# Patient Record
Sex: Female | Born: 1980
Health system: Southern US, Community
[De-identification: ages and names within clinical notes are randomized; demographics above are authoritative.]

## PROBLEM LIST (undated history)

## (undated) DIAGNOSIS — F329 Major depressive disorder, single episode, unspecified: Secondary | ICD-10-CM

## (undated) DIAGNOSIS — G8929 Other chronic pain: Secondary | ICD-10-CM

## (undated) DIAGNOSIS — M549 Dorsalgia, unspecified: Secondary | ICD-10-CM

## (undated) DIAGNOSIS — R569 Unspecified convulsions: Secondary | ICD-10-CM

## (undated) DIAGNOSIS — A630 Anogenital (venereal) warts: Secondary | ICD-10-CM

## (undated) DIAGNOSIS — R519 Headache, unspecified: Secondary | ICD-10-CM

## (undated) DIAGNOSIS — G709 Myoneural disorder, unspecified: Secondary | ICD-10-CM

## (undated) DIAGNOSIS — B009 Herpesviral infection, unspecified: Secondary | ICD-10-CM

## (undated) DIAGNOSIS — F32A Depression, unspecified: Secondary | ICD-10-CM

## (undated) DIAGNOSIS — R51 Headache: Secondary | ICD-10-CM

## (undated) HISTORY — DX: Depression, unspecified: F32.A

## (undated) HISTORY — DX: Unspecified convulsions: R56.9

## (undated) HISTORY — DX: Major depressive disorder, single episode, unspecified: F32.9

---

## 2000-11-27 ENCOUNTER — Other Ambulatory Visit: Admission: RE | Admit: 2000-11-27 | Discharge: 2000-11-27 | Payer: Self-pay | Admitting: Family Medicine

## 2001-02-10 ENCOUNTER — Other Ambulatory Visit: Admission: RE | Admit: 2001-02-10 | Discharge: 2001-02-10 | Payer: Self-pay | Admitting: Obstetrics and Gynecology

## 2001-05-09 ENCOUNTER — Observation Stay (HOSPITAL_COMMUNITY): Admission: AD | Admit: 2001-05-09 | Discharge: 2001-05-10 | Payer: Self-pay | Admitting: Obstetrics and Gynecology

## 2001-08-10 ENCOUNTER — Inpatient Hospital Stay (HOSPITAL_COMMUNITY): Admission: AD | Admit: 2001-08-10 | Discharge: 2001-08-13 | Payer: Self-pay | Admitting: Obstetrics and Gynecology

## 2001-08-16 ENCOUNTER — Encounter: Payer: Self-pay | Admitting: *Deleted

## 2001-08-16 ENCOUNTER — Emergency Department (HOSPITAL_COMMUNITY): Admission: EM | Admit: 2001-08-16 | Discharge: 2001-08-16 | Payer: Self-pay | Admitting: *Deleted

## 2002-12-21 ENCOUNTER — Encounter: Payer: Self-pay | Admitting: Emergency Medicine

## 2002-12-21 ENCOUNTER — Emergency Department (HOSPITAL_COMMUNITY): Admission: EM | Admit: 2002-12-21 | Discharge: 2002-12-21 | Payer: Self-pay | Admitting: Emergency Medicine

## 2004-08-15 ENCOUNTER — Ambulatory Visit: Payer: Self-pay | Admitting: Family Medicine

## 2004-09-24 ENCOUNTER — Ambulatory Visit: Payer: Self-pay | Admitting: Family Medicine

## 2005-06-05 ENCOUNTER — Emergency Department (HOSPITAL_COMMUNITY): Admission: EM | Admit: 2005-06-05 | Discharge: 2005-06-05 | Payer: Self-pay | Admitting: Emergency Medicine

## 2005-10-02 ENCOUNTER — Ambulatory Visit: Payer: Self-pay | Admitting: Family Medicine

## 2005-10-30 ENCOUNTER — Ambulatory Visit: Payer: Self-pay | Admitting: Family Medicine

## 2006-02-24 ENCOUNTER — Emergency Department (HOSPITAL_COMMUNITY): Admission: EM | Admit: 2006-02-24 | Discharge: 2006-02-24 | Payer: Self-pay | Admitting: Emergency Medicine

## 2006-03-04 ENCOUNTER — Ambulatory Visit: Payer: Self-pay | Admitting: Family Medicine

## 2006-03-11 ENCOUNTER — Ambulatory Visit: Payer: Self-pay | Admitting: Family Medicine

## 2006-03-25 ENCOUNTER — Ambulatory Visit: Payer: Self-pay | Admitting: Family Medicine

## 2006-05-21 ENCOUNTER — Ambulatory Visit: Payer: Self-pay | Admitting: Family Medicine

## 2006-07-24 ENCOUNTER — Ambulatory Visit: Payer: Self-pay | Admitting: Family Medicine

## 2006-10-15 ENCOUNTER — Other Ambulatory Visit: Admission: RE | Admit: 2006-10-15 | Discharge: 2006-10-15 | Payer: Self-pay | Admitting: Family Medicine

## 2006-10-15 ENCOUNTER — Encounter: Payer: Self-pay | Admitting: Family Medicine

## 2006-10-15 ENCOUNTER — Ambulatory Visit: Payer: Self-pay | Admitting: Family Medicine

## 2006-10-17 ENCOUNTER — Encounter: Payer: Self-pay | Admitting: Family Medicine

## 2006-10-21 ENCOUNTER — Ambulatory Visit: Payer: Self-pay | Admitting: Family Medicine

## 2007-01-13 ENCOUNTER — Ambulatory Visit: Payer: Self-pay | Admitting: Family Medicine

## 2007-01-14 ENCOUNTER — Encounter: Payer: Self-pay | Admitting: Family Medicine

## 2007-01-14 LAB — CONVERTED CEMR LAB
Candida species: NEGATIVE
GC Probe Amp, Genital: NEGATIVE
Gardnerella vaginalis: POSITIVE — AB
Trichomonal Vaginitis: POSITIVE — AB

## 2007-03-12 ENCOUNTER — Ambulatory Visit: Payer: Self-pay | Admitting: Family Medicine

## 2007-03-13 ENCOUNTER — Encounter: Payer: Self-pay | Admitting: Family Medicine

## 2007-03-13 LAB — CONVERTED CEMR LAB: Chlamydia, DNA Probe: NEGATIVE

## 2007-04-14 ENCOUNTER — Ambulatory Visit: Payer: Self-pay | Admitting: Family Medicine

## 2007-07-02 ENCOUNTER — Encounter: Payer: Self-pay | Admitting: Family Medicine

## 2007-07-20 ENCOUNTER — Ambulatory Visit: Payer: Self-pay | Admitting: Family Medicine

## 2007-10-12 ENCOUNTER — Ambulatory Visit: Payer: Self-pay | Admitting: Family Medicine

## 2007-10-24 ENCOUNTER — Emergency Department (HOSPITAL_COMMUNITY): Admission: EM | Admit: 2007-10-24 | Discharge: 2007-10-24 | Payer: Self-pay | Admitting: Emergency Medicine

## 2007-12-10 DIAGNOSIS — F32A Depression, unspecified: Secondary | ICD-10-CM | POA: Insufficient documentation

## 2007-12-10 DIAGNOSIS — F17200 Nicotine dependence, unspecified, uncomplicated: Secondary | ICD-10-CM | POA: Insufficient documentation

## 2007-12-10 DIAGNOSIS — F329 Major depressive disorder, single episode, unspecified: Secondary | ICD-10-CM

## 2007-12-10 DIAGNOSIS — F17210 Nicotine dependence, cigarettes, uncomplicated: Secondary | ICD-10-CM | POA: Insufficient documentation

## 2008-04-05 ENCOUNTER — Emergency Department (HOSPITAL_COMMUNITY): Admission: EM | Admit: 2008-04-05 | Discharge: 2008-04-05 | Payer: Self-pay | Admitting: Emergency Medicine

## 2008-08-10 ENCOUNTER — Emergency Department (HOSPITAL_COMMUNITY): Admission: EM | Admit: 2008-08-10 | Discharge: 2008-08-10 | Payer: Self-pay | Admitting: Emergency Medicine

## 2008-09-20 ENCOUNTER — Emergency Department (HOSPITAL_COMMUNITY): Admission: EM | Admit: 2008-09-20 | Discharge: 2008-09-20 | Payer: Self-pay | Admitting: Emergency Medicine

## 2008-09-27 ENCOUNTER — Ambulatory Visit: Payer: Self-pay | Admitting: Family Medicine

## 2008-09-27 DIAGNOSIS — R5383 Other fatigue: Secondary | ICD-10-CM

## 2008-09-27 DIAGNOSIS — R5381 Other malaise: Secondary | ICD-10-CM | POA: Insufficient documentation

## 2008-09-27 DIAGNOSIS — N939 Abnormal uterine and vaginal bleeding, unspecified: Secondary | ICD-10-CM

## 2008-09-27 DIAGNOSIS — N926 Irregular menstruation, unspecified: Secondary | ICD-10-CM | POA: Insufficient documentation

## 2008-09-27 DIAGNOSIS — R42 Dizziness and giddiness: Secondary | ICD-10-CM

## 2008-09-30 ENCOUNTER — Encounter: Payer: Self-pay | Admitting: Family Medicine

## 2008-10-03 ENCOUNTER — Encounter: Payer: Self-pay | Admitting: Family Medicine

## 2008-10-21 ENCOUNTER — Inpatient Hospital Stay (HOSPITAL_COMMUNITY): Admission: EM | Admit: 2008-10-21 | Discharge: 2008-10-23 | Payer: Self-pay | Admitting: Emergency Medicine

## 2008-10-21 ENCOUNTER — Encounter: Payer: Self-pay | Admitting: Family Medicine

## 2008-10-26 ENCOUNTER — Ambulatory Visit: Payer: Self-pay | Admitting: Family Medicine

## 2008-10-26 DIAGNOSIS — R569 Unspecified convulsions: Secondary | ICD-10-CM

## 2008-10-27 ENCOUNTER — Ambulatory Visit (HOSPITAL_COMMUNITY): Admission: RE | Admit: 2008-10-27 | Discharge: 2008-10-27 | Payer: Self-pay | Admitting: Family Medicine

## 2008-10-28 ENCOUNTER — Encounter: Payer: Self-pay | Admitting: Family Medicine

## 2008-11-02 ENCOUNTER — Encounter (HOSPITAL_COMMUNITY): Admission: RE | Admit: 2008-11-02 | Discharge: 2008-12-02 | Payer: Self-pay | Admitting: Orthopaedic Surgery

## 2008-11-09 ENCOUNTER — Encounter: Payer: Self-pay | Admitting: Family Medicine

## 2008-11-17 ENCOUNTER — Encounter: Payer: Self-pay | Admitting: Family Medicine

## 2008-11-28 ENCOUNTER — Emergency Department (HOSPITAL_COMMUNITY): Admission: EM | Admit: 2008-11-28 | Discharge: 2008-11-29 | Payer: Self-pay | Admitting: Emergency Medicine

## 2008-11-29 ENCOUNTER — Ambulatory Visit (HOSPITAL_COMMUNITY): Admission: RE | Admit: 2008-11-29 | Discharge: 2008-11-29 | Payer: Self-pay | Admitting: Obstetrics and Gynecology

## 2008-11-29 ENCOUNTER — Telehealth: Payer: Self-pay | Admitting: Family Medicine

## 2008-11-29 ENCOUNTER — Encounter: Payer: Self-pay | Admitting: Obstetrics and Gynecology

## 2009-01-19 ENCOUNTER — Ambulatory Visit: Payer: Self-pay | Admitting: Family Medicine

## 2009-02-24 ENCOUNTER — Emergency Department (HOSPITAL_COMMUNITY): Admission: EM | Admit: 2009-02-24 | Discharge: 2009-02-24 | Payer: Self-pay | Admitting: Emergency Medicine

## 2009-03-06 ENCOUNTER — Encounter: Payer: Self-pay | Admitting: Family Medicine

## 2009-05-21 ENCOUNTER — Emergency Department (HOSPITAL_COMMUNITY): Admission: EM | Admit: 2009-05-21 | Discharge: 2009-05-21 | Payer: Self-pay | Admitting: Emergency Medicine

## 2009-05-26 ENCOUNTER — Ambulatory Visit: Payer: Self-pay | Admitting: Family Medicine

## 2009-05-26 DIAGNOSIS — N760 Acute vaginitis: Secondary | ICD-10-CM | POA: Insufficient documentation

## 2009-05-27 ENCOUNTER — Encounter: Payer: Self-pay | Admitting: Family Medicine

## 2009-05-28 DIAGNOSIS — E669 Obesity, unspecified: Secondary | ICD-10-CM | POA: Insufficient documentation

## 2009-05-29 LAB — CONVERTED CEMR LAB
Chlamydia, DNA Probe: NEGATIVE
GC Probe Amp, Genital: NEGATIVE
Gardnerella vaginalis: POSITIVE — AB

## 2009-05-31 ENCOUNTER — Telehealth: Payer: Self-pay | Admitting: Family Medicine

## 2009-05-31 LAB — CONVERTED CEMR LAB

## 2009-06-05 ENCOUNTER — Emergency Department (HOSPITAL_COMMUNITY): Admission: EM | Admit: 2009-06-05 | Discharge: 2009-06-05 | Payer: Self-pay | Admitting: Emergency Medicine

## 2009-07-10 ENCOUNTER — Emergency Department (HOSPITAL_COMMUNITY): Admission: EM | Admit: 2009-07-10 | Discharge: 2009-07-10 | Payer: Self-pay | Admitting: Emergency Medicine

## 2009-07-27 ENCOUNTER — Ambulatory Visit: Payer: Self-pay | Admitting: Family Medicine

## 2009-07-28 ENCOUNTER — Encounter: Payer: Self-pay | Admitting: Family Medicine

## 2009-11-01 ENCOUNTER — Ambulatory Visit: Payer: Self-pay | Admitting: Family Medicine

## 2009-11-01 DIAGNOSIS — R197 Diarrhea, unspecified: Secondary | ICD-10-CM

## 2009-11-01 DIAGNOSIS — N912 Amenorrhea, unspecified: Secondary | ICD-10-CM

## 2009-11-19 ENCOUNTER — Emergency Department (HOSPITAL_COMMUNITY): Admission: EM | Admit: 2009-11-19 | Discharge: 2009-11-19 | Payer: Self-pay

## 2009-11-21 ENCOUNTER — Telehealth: Payer: Self-pay | Admitting: Family Medicine

## 2009-11-22 ENCOUNTER — Encounter: Payer: Self-pay | Admitting: Family Medicine

## 2010-01-03 ENCOUNTER — Emergency Department (HOSPITAL_COMMUNITY): Admission: EM | Admit: 2010-01-03 | Discharge: 2010-01-03 | Payer: Self-pay | Admitting: Emergency Medicine

## 2010-01-08 ENCOUNTER — Encounter: Payer: Self-pay | Admitting: Family Medicine

## 2010-03-13 ENCOUNTER — Ambulatory Visit: Payer: Self-pay | Admitting: Family Medicine

## 2010-04-02 ENCOUNTER — Emergency Department (HOSPITAL_COMMUNITY): Admission: EM | Admit: 2010-04-02 | Discharge: 2010-04-02 | Payer: Self-pay | Admitting: Emergency Medicine

## 2010-04-03 ENCOUNTER — Encounter: Payer: Self-pay | Admitting: Family Medicine

## 2010-06-06 ENCOUNTER — Ambulatory Visit: Payer: Self-pay | Admitting: Family Medicine

## 2010-06-13 ENCOUNTER — Encounter: Payer: Self-pay | Admitting: Family Medicine

## 2010-06-19 ENCOUNTER — Ambulatory Visit: Payer: Self-pay | Admitting: Family Medicine

## 2010-06-25 ENCOUNTER — Telehealth (INDEPENDENT_AMBULATORY_CARE_PROVIDER_SITE_OTHER): Payer: Self-pay | Admitting: *Deleted

## 2010-07-05 ENCOUNTER — Telehealth: Payer: Self-pay | Admitting: Family Medicine

## 2010-07-27 ENCOUNTER — Encounter: Payer: Self-pay | Admitting: Family Medicine

## 2010-08-07 ENCOUNTER — Encounter: Payer: Self-pay | Admitting: Family Medicine

## 2010-08-07 ENCOUNTER — Ambulatory Visit: Admit: 2010-08-07 | Payer: Self-pay | Admitting: Family Medicine

## 2010-08-12 ENCOUNTER — Encounter: Payer: Self-pay | Admitting: Family Medicine

## 2010-08-15 ENCOUNTER — Emergency Department (HOSPITAL_COMMUNITY)
Admission: EM | Admit: 2010-08-15 | Discharge: 2010-08-15 | Payer: Self-pay | Source: Home / Self Care | Admitting: Emergency Medicine

## 2010-08-19 LAB — CONVERTED CEMR LAB: Beta hcg, urine, semiquantitative: NEGATIVE

## 2010-08-21 NOTE — Assessment & Plan Note (Signed)
Summary: depo  Nurse Visit   Vital Signs:  Patient profile:   30 year old female Menstrual status:  irregular BP sitting:   108 / 70  (left arm)  Allergies: 1)  ! Morphine  Medication Administration  Injection # 1:    Medication: Depo-Provera 150mg     Diagnosis: FAMILY PLANNING (ICD-V25.09)    Route: IM    Site: RUOQ gluteus    Exp Date: 06/14    Lot #: Sanda Linger    Mfr: [email or web address]    Patient tolerated injection without complications    Given by: Adella Hare LPN (June 06, 2010 4:32 PM)  Orders Added: 1)  Depo-Provera 150mg  [J1055] 2)  Admin of Therapeutic Inj  intramuscular or subcutaneous [96372]   Medication Administration  Injection # 1:    Medication: Depo-Provera 150mg     Diagnosis: FAMILY PLANNING (ICD-V25.09)    Route: IM    Site: RUOQ gluteus    Exp Date: 06/14    Lot #: Sanda Linger    Mfr: [email or web address]    Patient tolerated injection without complications    Given by: Adella Hare LPN (June 06, 2010 4:32 PM)  Orders Added: 1)  Depo-Provera 150mg  [J1055] 2)  Admin of Therapeutic Inj  intramuscular or subcutaneous [96372] ptreceived depo with no complications

## 2010-08-21 NOTE — Assessment & Plan Note (Signed)
Summary: stomach - room 1   Vital Signs:  Patient profile:   30 year old female Menstrual status:  irregular Height:      62 inches Weight:      170.75 pounds BMI:     31.34 O2 Sat:      98 % on Room air Pulse rate:   85 / minute Resp:     16 per minute BP sitting:   112 / 60  (left arm)  Vitals Entered By: Adella Hare LPN (November 01, 2009 3:38 PM) CC: stomach problems- bloating, discomfort, diarrhea Is Patient Diabetic? No Pain Assessment Patient in pain? no      Comments didnt reconcile meds, patient does states there have been med changes, doesnt have meds with her   Primary Provider:  Syliva Overman MD  CC:  stomach problems- bloating, discomfort, and diarrhea.  History of Present Illness: Pt c/o abd pains, & diarrhea x 1- 2  weeks.  Loose stool, not watery, approx 4 x/day.  No blood . Is feeling bloated. Appetitie has been normal.No antibiotics recently.  No travel.  No family members with diarrhea recently. No hx of bowel probs.  Has not tried anything for diarrhea. Has been also waking up at night with night sweats off & on last 2 weeks.  Pt is requesting a preg test today.  States [name] received her DepoProvera injection 2-3 wks ago.  Had a miscarriage in the past a couple of wks after Depo shot.  Medication reconcilliation done by pt recall.  Allergies (verified): 1)  ! Morphine  Past History:  Past medical history reviewed for relevance to current acute and chronic problems.  Past Medical History: Reviewed history from 01/19/2009 and no changes required.   NICOTINE ADDICTION (ICD-305.1) DEPRESSION (ICD-311) Seizure disorder  2010  Review of Systems General:  Denies chills, fever, and loss of appetite. GI:  Complains of abdominal pain and diarrhea; denies bloody stools, constipation, dark tarry stools, indigestion, loss of appetite, nausea, and vomiting. GU:  Denies abnormal vaginal bleeding, dysuria, and urinary frequency.  Physical Exam  General:   Well-developed,well-nourished,in no acute distress; alert,appropriate and cooperative throughout examination Head:  Normocephalic and atraumatic without obvious abnormalities. No apparent alopecia or balding. Ears:  External ear exam shows no significant lesions or deformities.  Otoscopic examination reveals clear canals, tympanic membranes are intact bilaterally without bulging, retraction, inflammation or discharge. Hearing is grossly normal bilaterally. Nose:  External nasal examination shows no deformity or inflammation. Nasal mucosa are pink and moist without lesions or exudates. Mouth:  Oral mucosa and oropharynx without lesions or exudates.  Neck:  No deformities, masses, or tenderness noted. Lungs:  Normal respiratory effort, chest expands symmetrically. Lungs are clear to auscultation, no crackles or wheezes. Heart:  Normal rate and regular rhythm. S1 and S2 normal without gallop, murmur, click, rub or other extra sounds. Abdomen:  soft, normal bowel sounds, no masses, no guarding, no rigidity, no rebound tenderness, no abdominal hernia, no hepatomegaly, and no splenomegaly.  Mild TTP LLQ noted. Abd is rounded, but does not feel distended. Cervical Nodes:  No lymphadenopathy noted Psych:  Cognition and judgment appear intact. Alert and cooperative with normal attention span and concentration. No apparent delusions, illusions, hallucinations   Impression & Recommendations:  Problem # 1:  DIARRHEA, ACUTE (ICD-787.91) Assessment New  Orders: T-Stool for O&P (16109-60454) T-Stool Culture (09811) Stool C-Diff toxin assay- [facility] ([id number])  Problem # 2:  AMENORRHEA (ICD-626.0)  Due to Depo Provera. Pregnancy test done today  for pt reassurance.  Her updated medication list for this problem includes:    Depo-provera 150 Mg/ml Susp (Medroxyprogesterone acetate) ..... 150mg  every 12 weeks  Orders: Urine Pregnancy Test  (04540)  Complete Medication List: 1)  Depo-provera 150 Mg/ml  Susp (Medroxyprogesterone acetate) .... 150mg  every 12 weeks 2)  Lamictal 200 Mg Tabs (Lamotrigine) .... One tab by mouth bid 3)  Ibuprofen 800 Mg Tabs (Ibuprofen) .... One tab by mouth two times a day prn 4)  Fioricet 50-325-40 Mg Tabs (Butalbital-apap-caffeine) .[name].. 1-2 tabs q 8 hrs 5)  Fluoxetine Hcl 20 Mg Tabs (Fluoxetine hcl) .... Take 1 tablet by mouth once a day 6)  Famvir 250 Mg Tabs (Famciclovir) .... Take 1 tablet by mouth two times a day 7)  Lamictal 100 Mg Tabs (Lamotrigine) .... Take 1 bid 8)  Bentyl 20 Mg Tabs (Dicyclomine hcl) .... Take one tab every 6 hrs as needed for abdominal pain and diarrhea  Patient Instructions: 1)  Please schedule a follow-up appointment as needed.  If your syptoms do not improve in one week you will need to call the office for an appt. 2)  I have ordered stool studies to check for intestinal infections.  Please collect a stool sample at home & take to the lab as directed. 3)  Avoid spicy, greasy foods, and dairy products until your syptoms improve.  I recommend a bland diet and clear fluids. 4)  I have prescribed a medication to help with cramping & diarrhea. Prescriptions: BENTYL 20 MG TABS (DICYCLOMINE HCL) take one tab every 6 hrs as needed for abdominal pain and diarrhea  #24 x 0   Entered and Authorized by:   Esperanza Sheets PA   Signed by:   Esperanza Sheets PA on 11/01/2009   Method used:   Electronically to        [facility] [phone]* (retail)       [address] [place], [place]  [address]       Ph: [phone]       Fax: [phone]   RxID:   [id number]   Laboratory Results   Urine Tests  Date/Time Received: November 01, 2009  Date/Time Reported: November 01, 2009     Urine HCG: negative

## 2010-08-21 NOTE — Progress Notes (Signed)
Summary: [facility]   Imported By: Lind Guest 01/09/2010 09:07:23  _____________________________________________________________________  External Attachment:    Type:   Image     Comment:   External Document

## 2010-08-21 NOTE — Letter (Signed)
Summary: 1st missed letter  1st missed letter   Imported By: Lind Guest 06/13/2010 17:22:25  _____________________________________________________________________  External Attachment:    Type:   Image     Comment:   External Document

## 2010-08-21 NOTE — Letter (Signed)
Summary: [facility] RE[facility]  MEDICAL RELEASE   Imported By: Lind Guest 07/28/2009 09:51:33  _____________________________________________________________________  External Attachment:    Type:   Image     Comment:   External Document

## 2010-08-21 NOTE — Assessment & Plan Note (Signed)
Summary: ov   Vital Signs:  Patient profile:   30 year old female Menstrual status:  irregular Height:      62 inches Weight:      169 pounds BMI:     31.02 O2 Sat:      98 % on Room air Pulse rate:   81 / minute Pulse rhythm:   regular Resp:     16 per minute BP sitting:   100 / 70  (left arm)  Vitals Entered By: Adella Hare LPN (June 19, 2010 10:52 AM)  Nutrition Counseling: Patient's BMI is greater than 25 and therefore counseled on weight management options.  O2 Flow:  Room air CC: follow-up visit Is Patient Diabetic? No Pain Assessment Patient in pain? no        Primary Care Provider:  Syliva Overman MD  CC:  follow-up visit.  History of Present Illness: Smoking 1PPd ,t his is an incrase states she is stressed out, no plan to quit at this time. She has symptoms of uncontrolled dpression.  Denies recent fever or chills. Denies sinus pressure, nasal congestion , ear pain or sore throat. Denies chest congestion, or cough productive of sputum. Denies chest pain, palpitations, PND, orthopnea or leg swelling. Denies abdominal pain, nausea, vomitting, diarrhea or constipation. Denies change in bowel movements or bloody stool. Denies dysuria , frequency, incontinence or hesitancy. Denies  joint pain, swelling, or reduced mobility.   Denies  rash, lesions, or itch.      Preventive Screening-Counseling & Management  Alcohol-Tobacco     Smoking Cessation Counseling: yes  Current Medications (verified): 1)  Depo-Provera 150 Mg/ml Susp (Medroxyprogesterone Acetate) .... 150mg  Every 12 Weeks 2)  Lamictal 200 Mg Tabs (Lamotrigine) .... One Tab By Mouth Bid 3)  Fluoxetine Hcl 20 Mg Tabs (Fluoxetine Hcl) .... Take 1 Tablet By Mouth Once A Day 4)  Levetiracetam 750 Mg Tabs (Levetiracetam) .... Two Tabs By Mouth Two Times A Day  Allergies (verified): 1)  ! Morphine  Review of Systems      See HPI General:  Complains of malaise and sleep disorder. Eyes:   Denies blurring and discharge. Derm:  Complains of itching and rash; 1 month h/o bilateral groin itch. Neuro:  Complains of seizures; ghaving  on avg 2 seizures per month, dose is being adjusted by neurology also headaches are not good , the ibuprofen is not helping. Psych:  Complains of depression, easily tearful, and suicidal thoughts/plans; has had suicidal thoughts as recently as last night , stressed out alot, states she will wait till her brother's killer is found he was killed 1997, also wants to raise her 30 y/o , thinks of overdosing. Endo:  Denies cold intolerance, excessive thirst, and excessive urination. Heme:  Denies abnormal bruising and bleeding. Allergy:  Denies hives or rash and itching eyes.  Physical Exam  General:  Well-developed,well-nourished,in no acute distress; alert,appropriate and cooperative throughout examination HEENT: No facial asymmetry,  EOMI, No sinus tenderness, TM's Clear, oropharynx  pink and moist.   Chest: Clear to auscultation bilaterally.  CVS: S1, S2, No murmurs, No S3.   Abd: Soft, Nontender.  MS: Adequate ROM spine, hips, shoulders and knees.  Ext: No edema.   CNS: CN 2-12 intact, power tone and sensation normal throughout.   Skin: Intact, no visible lesions or rashes.  Psych: Good eye contact, normal affect.  Memory intact, tearful and  depressed appearing.    Impression & Recommendations:  Problem # 1:  SEIZURE DISORDER (ICD-780.39) Assessment  Unchanged  The following medications were removed from the medication list:    Lamictal 100 Mg Tabs (Lamotrigine) .[name]... Take 1 bid Her updated medication list for this problem includes:    Lamictal 200 Mg Tabs (Lamotrigine) ..... One tab by mouth bid    Levetiracetam 750 Mg Tabs (Levetiracetam) .[name]..[name] Two tabs by mouth two times a day treated by neurology  Problem # 2:  NICOTINE ADDICTION (ICD-305.1) Assessment: Deteriorated  Encouraged smoking cessation and discussed different methods for  smoking cessation.   Problem # 3:  OVERWEIGHT (ICD-278.02) Assessment: Unchanged  Ht: 62 (06/19/2010)   Wt: 169 (06/19/2010)   BMI: 31.02 (06/19/2010) therapeutic lifestyle change discussed and encouraged  Complete Medication List: 1)  Depo-provera 150 Mg/ml Susp (Medroxyprogesterone acetate) .... 150mg  every 12 weeks 2)  Lamictal 200 Mg Tabs (Lamotrigine) .... One tab by mouth bid 3)  Fluoxetine Hcl 20 Mg Tabs (Fluoxetine hcl) .... Take 1 tablet by mouth once a day 4)  Levetiracetam 750 Mg Tabs (Levetiracetam) .... Two tabs by mouth two times a day 5)  Terbinafine Hcl 250 Mg Tabs (Terbinafine hcl) .... Take 1 tablet by mouth once a day 6)  Clotrimazole-betamethasone 1-0.05 % Crea (Clotrimazole-betamethasone) .... Apply twice daily to rash in groin for 10 days then as needed  Other Orders: Psychology Referral (Psychology)  Patient Instructions: 1)  CPE in January , pls change the nurse visit for depo to a cpe.Depo was 11/16, needs a date 12 weeks after that. 2)  You will be referred to psychiatry it is vital that you go, [facility]. 3)  Tobacco is very bad for your health and your loved ones! You Should stop smoking!. 4)  Stop Smoking Tips: Choose a Quit date. Cut down before the Quit date. decide what you will do as a substitute when you feel the urge to smoke(gum,toothpick,exercise). 5)  BMP prior to visit, ICD-9: 6)  Lipid Panel prior to visit, ICD-9:   fasting asap 7)  CBC w/ Diff prior to visit, ICD-9: 8)  Med and tablets for fungal infection are sent in 9)  If you are actively suicidal or homicidal pls call 911, or go directly to the hospital Prescriptions: CLOTRIMAZOLE-BETAMETHASONE 1-0.05 % CREA (CLOTRIMAZOLE-BETAMETHASONE) apply twice daily to rash in groin for 10 days then as needed  #45 gm x 1   Entered and Authorized by:   Syliva Overman MD   Signed by:   Syliva Overman MD on 06/19/2010   Method used:   Electronically to        [facility] [address] [phone]*  (retail)       [address] [place], [place]  [address]       Ph: [phone]       Fax: [phone]   RxID:   [id number] TERBINAFINE HCL 250 MG TABS (TERBINAFINE HCL) Take 1 tablet by mouth once a day  #7 x 0   Entered and Authorized by:   Syliva Overman MD   Signed by:   Syliva Overman MD on 06/19/2010   Method used:   Electronically to        [facility] [address] [phone]* (retail)       [address] [place], [place]  [address]       Ph: [phone]       Fax: [phone]   RxID:   [id number]    Orders Added: 1)  Est. Patient Level IV [  99214] 2)  Psychology Referral [Psychology]

## 2010-08-21 NOTE — Progress Notes (Signed)
Summary: [facility]  [facility]   Imported By: Lind Guest 11/23/2009 13:21:02  _____________________________________________________________________  External Attachment:    Type:   Image     Comment:   External Document

## 2010-08-21 NOTE — Progress Notes (Signed)
Summary: seizure  Phone Note Call from Patient   Summary of Call: pt had seizure sunday and states her bones ache and she can't even stretch her body because she hurts so bad. [phone] Initial call taken by: Rudene Anda,  Nov 21, 2009 1:35 PM  Follow-up for Phone Call        advise and erx ibuprofen 600mg  .tisdtab #30  offer nv in am for toradol 60mg  and depomedrol 80mg  iM also Follow-up by: Syliva Overman MD,  Nov 21, 2009 4:20 PM  Additional Follow-up for Phone Call Additional follow up Details #1::        patient states not to send in ibuprofen, already taking, will come in in the am Additional Follow-up by: Adella Hare LPN,  Nov 22, 5954 4:25 PM

## 2010-08-21 NOTE — Assessment & Plan Note (Signed)
Summary: OV   Vital Signs:  Patient profile:   30 year old female Menstrual status:  irregular Height:      62 inches Weight:      168.50 pounds BMI:     30.93 O2 Sat:      98 % Pulse rate:   75 / minute Pulse rhythm:   regular Resp:     16 per minute BP sitting:   120 / 76  (left arm) Cuff size:   regular  Vitals Entered By: Everitt Amber LPN (March 13, 2010 1:06 PM)  Nutrition Counseling: Patient's BMI is greater than 25 and therefore counseled on weight management options. CC: Follow up chronic problems, the Bentyl she got last time she states doesn't work    Primary Care Provider:  Syliva Overman MD  CC:  Follow up chronic problems and the Bentyl she got last time she states doesn't work .  History of Present Illness: Reports  that she has been doing fairlywell. Denies recent fever or chills. Denies sinus pressure, nasal congestion , ear pain or sore throat. Denies chest congestion, or cough productive of sputum. Denies chest pain, palpitations, PND, orthopnea or leg swelling. Denies abdominal pain, nausea, vomitting, diarrhea or constipation. Denies change in bowel movements or bloody stool. Denies dysuria , frequency, incontinence or hesitancy. Denies  joint pain, swelling, or reduced mobility. Denies headaches,or  vertigo,  Denies  rash, lesions, or itch.     Preventive Screening-Counseling & Management  Alcohol-Tobacco     Smoking Cessation Counseling: yes  Current Medications (verified): 1)  Depo-Provera 150 Mg/ml Susp (Medroxyprogesterone Acetate) .... 150mg  Every 12 Weeks 2)  Lamictal 200 Mg Tabs (Lamotrigine) .... One Tab By Mouth Bid 3)  Ibuprofen 800 Mg Tabs (Ibuprofen) .... One Tab By Mouth Two Times A Day Prn 4)  Fioricet 50-325-40 Mg Tabs (Butalbital-Apap-Caffeine) .[name].. 1-2 Tabs Q 8 Hrs 5)  Fluoxetine Hcl 20 Mg Tabs (Fluoxetine Hcl) .... Take 1 Tablet By Mouth Once A Day 6)  Famvir 250 Mg Tabs (Famciclovir) .... Take 1 Tablet By Mouth Two Times  A Day 7)  Lamictal 100 Mg Tabs (Lamotrigine) .... Take 1 Bid 8)  Bentyl 20 Mg Tabs (Dicyclomine Hcl) .... Take One Tab Every 6 Hrs As Needed For Abdominal Pain and Diarrhea  Allergies (verified): 1)  ! Morphine  Review of Systems      See HPI General:  Complains of fatigue. GI:  Complains of change in bowel habits, diarrhea, nausea, and vomiting; denies abdominal pain and constipation; 4 to 5 week h/o watery diarreah, no mucus or blood, on avg 5 times /day, vomit about 3 times /day, initial;luy seen in April symptoms cleared and have returned , no constitutional symptoms ,no one els affected.. Derm:  Denies insect bite(s), lesion(s), and rash. Neuro:  Complains of seizures; last seizure was in July, std new med in July and states this seems to be helping. Psych:  Complains of depression, easily tearful, irritability, and mental problems; denies suicidal thoughts/plans, thoughts of violence, and thoughts /plans of harming others. Endo:  Denies excessive thirst and excessive urination. Heme:  Denies abnormal bruising and bleeding. Allergy:  Complains of seasonal allergies; denies hives or rash and itching eyes.  Physical Exam  General:  Well-developed,well-nourished,in no acute distress; alert,appropriate and cooperative throughout examination HEENT: No facial asymmetry,  EOMI, No sinus tenderness, TM's Clear, oropharynx  pink and moist.   Chest: Clear to auscultation bilaterally.  CVS: S1, S2, No murmurs, No S3.   Abd:  Soft, Nontender.  MS: Adequate ROM spine, hips, shoulders and knees.  Ext: No edema.   CNS: CN 2-12 intact, power tone and sensation normal throughout.   Skin: Intact, no visible lesions or rashes.  Psych: Good eye contact, normal affect.  Memory intact, r depressed appearing.    Impression & Recommendations:  Problem # 1:  FAMILY PLANNING (ICD-V25.09) Assessment Comment Only  Orders: Depo-Provera 150mg  (E4540) Admin of Therapeutic Inj  intramuscular or  subcutaneous (98119)  Problem # 2:  DIARRHEA, ACUTE (ICD-787.91) Assessment: Unchanged  Her updated medication list for this problem includes:    Lomotil 2.5-0.025 Mg Tabs (Diphenoxylate-atropine) .[name]... Take 1 tablet by mouth four times a day as needed  Problem # 3:  OVERWEIGHT (ICD-278.02) Assessment: Unchanged  Ht: 62 (03/13/2010)   Wt: 168.50 (03/13/2010)   BMI: 30.93 (03/13/2010)  Problem # 4:  SEIZURE DISORDER (ICD-780.39) Assessment: Improved  Her updated medication list for this problem includes:    Lamictal 200 Mg Tabs (Lamotrigine) ..... One tab by mouth bid    Lamictal 100 Mg Tabs (Lamotrigine) .[name]... Take 1 bid no recent seizures with new med, followed by neurology  Problem # 5:  NICOTINE ADDICTION (ICD-305.1) Assessment: Deteriorated  Orders: CXR- 2view (CXR)  Encouraged smoking cessation and discussed different methods for smoking cessation.   Problem # 6:  DEPRESSION (ICD-311) Assessment: Improved  Her updated medication list for this problem includes:    Fluoxetine Hcl 20 Mg Tabs (Fluoxetine hcl) .[name]... Take 1 tablet by mouth once a day  Complete Medication List: 1)  Depo-provera 150 Mg/ml Susp (Medroxyprogesterone acetate) .... 150mg  every 12 weeks 2)  Lamictal 200 Mg Tabs (Lamotrigine) .... One tab by mouth bid 3)  Ibuprofen 800 Mg Tabs (Ibuprofen) .... One tab by mouth two times a day prn 4)  Fioricet 50-325-40 Mg Tabs (Butalbital-apap-caffeine) .[name].. 1-2 tabs q 8 hrs 5)  Fluoxetine Hcl 20 Mg Tabs (Fluoxetine hcl) .... Take 1 tablet by mouth once a day 6)  Famvir 250 Mg Tabs (Famciclovir) .... Take 1 tablet by mouth two times a day 7)  Lamictal 100 Mg Tabs (Lamotrigine) .... Take 1 bid 8)  Lomotil 2.5-0.025 Mg Tabs (Diphenoxylate-atropine) .... Take 1 tablet by mouth four times a day as needed  Other Orders: T-Basic Metabolic Panel [phone]) T-Lipid Profile [phone]) T-TSH [id number]) T-CBC w/Diff [id number])  Patient Instructions: 1)   CPE in 4 to 6 weeks 2)  Tobacco is very bad for your health and your loved ones! You Should stop smoking!. 3)  Stop Smoking Tips: Choose a Quit date. Cut down before the Quit date. decide what you will do as a substitute when you feel the urge to smoke(gum,toothpick,exercise). 4)  It is important that you exercise regularly at least 30 minutes 5 times a week. If you develop chest pain, have severe difficulty breathing, or feel very tired , stop exercising immediately and seek medical attention. 5)  You need to lose weight. Consider a lower calorie diet and regular exercise.  6)  BMP prior to visit, ICD-9: 7)  Lipid Panel prior to visit, ICD-9: 8)  TSH prior to visit, ICD-9:   fasting asap 9)  CBC w/ Diff prior to visit, ICD-9: 10)  You need to submit stool samples  ordered 11)  We will give you the depo today if due  Prescriptions: LOMOTIL 2.5-0.025 MG TABS (DIPHENOXYLATE-ATROPINE) Take 1 tablet by mouth four times a day as needed  #20 x 0   Entered and Authorized by:  Syliva Overman MD   Signed by:   Syliva Overman MD on 03/13/2010   Method used:   Printed then faxed to ...       [facility] [address] [phone]* (retail)       [address] [place], [place]  [address]       Ph: [phone]       Fax: [phone]   RxID:   [id number]    Medication Administration  Injection # 1:    Medication: Depo-Provera 150mg     Diagnosis: FAMILY PLANNING (ICD-V25.09)    Route: IM    Site: L deltoid    Exp Date: 07/2012    Lot #: [id number]     Mfr: [email or web address]     Comments: 150mg  given     Patient tolerated injection without complications    Given by: Everitt Amber LPN (March 13, 2010 2:57 PM)  Orders Added: 1)  Est. Patient Level IV [99214] 2)  CXR- 2view [CXR] 3)  T-Basic Metabolic Panel [[id number]] 4)  T-Lipid Profile [[id number]] 5)  T-TSH [[id number]] 6)  T-CBC w/Diff [[id number]] 7)  Depo-Provera 150mg  [J1055] 8)  Admin of Therapeutic Inj  intramuscular or subcutaneous  [75643]

## 2010-08-21 NOTE — Assessment & Plan Note (Signed)
Summary: office visit   Vital Signs:  Patient profile:   30 year old female Menstrual status:  irregular Height:      62 inches Weight:      159.75 pounds BMI:     29.32 O2 Sat:      98 % Pulse rate:   87 / minute Pulse rhythm:   regular Resp:     16 per minute BP sitting:   112 / 78 Cuff size:   regular  Vitals Entered By: Everitt Amber (July 27, 2009 10:06 AM)  Nutrition Counseling: Patient's BMI is greater than 25 and therefore counseled on weight management options. CC: Follow up chronic problems Is Patient Diabetic? No   Primary Care Provider:  Syliva Overman MD  CC:  Follow up chronic problems.  History of Present Illness: Pt had a seizure  on Dec 20, she has an upcoming appt with neurology.   She continues to experience significantsymptoms of depression around the dx of herpes worried about passing it to her children.I explained that this is only intrapartum, sop she need not worry. She is smoking more than before, has a poor apetitie, is socially withdrawn, and frankly seems to be at best, the saME IF NOT WORSE WITH REGARDS TO Her depression. she plans to contact mental health for counselling.  Preventive Screening-Counseling & Management  Alcohol-Tobacco     Smoking Status: current     Smoking Cessation Counseling: yes     Packs/Day: 1.0  Comments: states she has tried to cut back but she is too stressed  Current Medications (verified): 1)  Depo-Provera 150 Mg/ml Susp (Medroxyprogesterone Acetate) .... 150mg  Every 12 Weeks 2)  Lamictal 200 Mg Tabs (Lamotrigine) .... One Tab By Mouth Bid 3)  Fluoxetine Hcl 10 Mg Caps (Fluoxetine Hcl) .... Take 1 Capsule By Mouth Once A Day 4)  Ibuprofen 800 Mg Tabs (Ibuprofen) .... One Tab By Mouth Two Times A Day Prn 5)  Fioricet 50-325-40 Mg Tabs (Butalbital-Apap-Caffeine) .[name].. 1-2 Tabs Q 8 Hrs  Allergies (verified): 1)  ! Morphine  Past History:  Social History: Packs/Day:  1.0  Review of Systems General:   Complains of fatigue; denies chills and fever. Eyes:  Denies blurring and discharge. ENT:  Denies hoarseness, nosebleeds, sinus pressure, and sore throat. CV:  Denies chest pain or discomfort, palpitations, and swelling of feet. Resp:  Denies cough and sputum productive. GI:  Denies abdominal pain, constipation, diarrhea, nausea, and vomiting. GU:  Denies discharge, dysuria, and urinary frequency. MS:  Denies joint pain, low back pain, and mid back pain. Derm:  Denies itching and rash. Neuro:  Complains of headaches and seizures; denies sensation of room spinning and tingling; seizure activity still uncontrolled. Psych:  Complains of depression, easily angered, easily tearful, irritability, and mental problems; denies suicidal thoughts/plans, thoughts of violence, and unusual visions or sounds. Endo:  Denies cold intolerance, excessive hunger, excessive thirst, excessive urination, heat intolerance, polyuria, and weight change. Heme:  Denies abnormal bruising and bleeding. Allergy:  Denies hives or rash.  Physical Exam  General:  Well-developed,well-nourished,in no acute distress; flat affect, poor eye contact, tearful and depressed appearing throughout the exam HEENT: No facial asymmetry,  EOMI, No sinus tenderness, TM's Clear, oropharynx  pink and moist.   Chest: Clear to auscultation bilaterally.  CVS: S1, S2, No murmurs, No S3.   Abd: Soft, Nontender.  MS: Adequate ROM spine, hips, shoulders and knees.  Ext: No edema.   CNS: CN 2-12 intact, power tone and sensation  normal throughout.   Skin: Intact, no visible lesions or rashes.     Impression & Recommendations:  Problem # 1:  OVERWEIGHT (ICD-278.02) Assessment Improved  Ht: 62 (07/27/2009)   Wt: 159.75 (07/27/2009)   BMI: 29.32 (07/27/2009)  Problem # 2:  SEIZURE DISORDER (ICD-780.39) Assessment: Unchanged  Her updated medication list for this problem includes:    Lamictal 200 Mg Tabs (Lamotrigine) ..... One tab by mouth  two times a day rEPORTS SEIZURE WITHIN THE PAST 2 TO 3 WEEKS, SHE IS FOLLOWED BY NEUROLOGY AND REPORTS ADHERENCE TO TREATMENT PLAN. sHE IS UNDER INC STRESS, AND STILL VERY DEPRESSED  Problem # 3:  NICOTINE ADDICTION (ICD-305.1) Assessment: Deteriorated  Encouraged smoking cessation and discussed different methods for smoking cessation. States she is using nicotine to help with her depression  Problem # 4:  DEPRESSION (ICD-311) Assessment: Unchanged  The following medications were removed from the medication list:    Fluoxetine Hcl 10 Mg Caps (Fluoxetine hcl) .[name]... Take 1 capsule by mouth once a day Her updated medication list for this problem includes:    Fluoxetine Hcl 20 Mg Tabs (Fluoxetine hcl) .[name]... Take 1 tablet by mouth once a day  Discussed treatment options, including trial of antidpressant medication. Will refer to behavioral health. Follow-up call in in 24-48 hours and recheck in 2 weeks, sooner as needed. Patient agrees to call if any worsening of symptoms or thoughts of doing harm arise. Verified that the patient has no suicidal ideation at this time.   Complete Medication List: 1)  Depo-provera 150 Mg/ml Susp (Medroxyprogesterone acetate) .... 150mg  every 12 weeks 2)  Lamictal 200 Mg Tabs (Lamotrigine) .... One tab by mouth bid 3)  Ibuprofen 800 Mg Tabs (Ibuprofen) .... One tab by mouth two times a day prn 4)  Fioricet 50-325-40 Mg Tabs (Butalbital-apap-caffeine) .[name].. 1-2 tabs q 8 hrs 5)  Fluoxetine Hcl 20 Mg Tabs (Fluoxetine hcl) .... Take 1 tablet by mouth once a day 6)  Famvir 250 Mg Tabs (Famciclovir) .... Take 1 tablet by mouth two times a day  Other Orders: Tdap => 11yrs IM ([address]109) Admin 1st Vaccine (60454)  Patient Instructions: 1)  Please schedule a CPE 2 months. 2)  It is important that you exercise regularly at least 20 minutes 5 times a week. If you develop chest pain, have severe difficulty breathing, or feel very tired , stop exercising immediately and seek  medical attention. 3)  You need to lose weight. Consider a lower calorie diet and regular exercise.  4)  Tobacco is very bad for your health and your loved ones! You Should stop smoking!. 5)  Stop Smoking Tips: Choose a Quit date. Cut down before the Quit date. decide what you will do as a substitute when you feel the urge to smoke(gum,toothpick,exercise). 6)  Dose increase of fluoxetine to 20mg   daily , take two 10mg  tabs till done,new med for chronic daily use to reduce herpes flares 7)  PLS call mental health and make appt for counselling. Prescriptions: FAMVIR 250 MG TABS (FAMCICLOVIR) Take 1 tablet by mouth two times a day  #60 x 3   Entered and Authorized by:   Syliva Overman MD   Signed by:   Syliva Overman MD on 07/27/2009   Method used:   Electronically to        [facility] [address] [phone]* (retail)       [address]       [place], [place]  [address]  Ph: [phone]       Fax: [phone]   RxID:   [id number] FLUOXETINE HCL 20 MG TABS (FLUOXETINE HCL) Take 1 tablet by mouth once a day  #30 x 3   Entered and Authorized by:   Syliva Overman MD   Signed by:   Syliva Overman MD on 07/27/2009   Method used:   Printed then faxed to ...       [facility] [address] [phone]* (retail)       [address] [place]  [address]       Ph: [phone]       Fax: [phone]   RxID:   [id number]    Immunizations Administered:  Tetanus Vaccine:    Vaccine Type: Tdap    Site: right deltoid    Mfr: Sanofi Pasteur    Dose: 0.5 ml    Route: IM    Given by: Worthy Keeler LPN    Exp. Date: 08/19/2011    Lot #: [id number]    VIS given: 06/09/07 version given July 27, 2009.

## 2010-08-21 NOTE — Letter (Signed)
Summary: [facility]   Imported By: Lind Guest 04/04/2010 14:25:03  _____________________________________________________________________  External Attachment:    Type:   Image     Comment:   External Document

## 2010-08-23 NOTE — Letter (Signed)
Summary: medical records  medical records   Imported By: Lind Guest 07/27/2010 08:29:11  _____________________________________________________________________  External Attachment:    Type:   Image     Comment:   External Document

## 2010-08-23 NOTE — Letter (Signed)
Summary: 2nd missed letter   2nd missed letter   Imported By: Lind Guest 08/08/2010 10:23:21  _____________________________________________________________________  External Attachment:    Type:   Image     Comment:   External Document

## 2010-08-23 NOTE — Progress Notes (Signed)
Summary: MEDICINE  Phone Note Call from Patient   Summary of Call: WANTS TO KNOW WILL YOU REFILL HER FAMVIR 250 MG AT [facility] Initial call taken by: Lind Guest,  July 05, 2010 3:23 PM  Follow-up for Phone Call        may i refill this? no longer on med list Follow-up by: Adella Hare LPN,  July 06, 2010 8:34 AM  Additional Follow-up for Phone Call Additional follow up Details #1::        I sent in electronically for chronic suppression she takes thisd all the time , pls let her know it's for genital herpes Additional Follow-up by: Syliva Overman MD,  July 06, 2010 3:11 PM    New/Updated Medications: FAMVIR 250 MG TABS (FAMCICLOVIR) Take 1 tablet by mouth two times a day Prescriptions: FAMVIR 250 MG TABS (FAMCICLOVIR) Take 1 tablet by mouth two times a day  #60 x 5   Entered and Authorized by:   Syliva Overman MD   Signed by:   Syliva Overman MD on 07/06/2010   Method used:   Electronically to        [facility] [address] [phone]* (retail)       [address], [place]  [address]       Ph: [phone]       Fax: [phone]   RxID:   [id number]

## 2010-08-23 NOTE — Progress Notes (Signed)
Summary: broke thumb nail coming off  Phone Note Call from Patient   Summary of Call: patient called in wanted to know was she a diabete because she broke her tumb and her finger nail is about to come off because Dr. Consuello Closs asked was she a diabete. please call [phone] Initial call taken by: Eugenio Hoes,  June 25, 2010 9:31 AM  Follow-up for Phone Call        pls advise not diabetic if she calls back, I only got a msg machine Follow-up by: Syliva Overman MD,  June 25, 2010 12:21 PM  Additional Follow-up for Phone Call Additional follow up Details #1::        LEFT MESSAGE Additional Follow-up by: Lind Guest,  June 26, 2010 9:06 AM    Additional Follow-up for Phone Call Additional follow up Details #2::    left message Follow-up by: Lind Guest,  June 26, 2010 9:07 AM  Additional Follow-up for Phone Call Additional follow up Details #3:: Details for Additional Follow-up Action Taken: left message Additional Follow-up by: Lind Guest,  July 05, 2010 11:54 AM

## 2010-08-29 ENCOUNTER — Encounter (INDEPENDENT_AMBULATORY_CARE_PROVIDER_SITE_OTHER): Payer: Medicaid Other

## 2010-08-29 ENCOUNTER — Encounter: Payer: Self-pay | Admitting: Family Medicine

## 2010-08-29 ENCOUNTER — Telehealth (INDEPENDENT_AMBULATORY_CARE_PROVIDER_SITE_OTHER): Payer: Self-pay | Admitting: *Deleted

## 2010-08-29 DIAGNOSIS — M79609 Pain in unspecified limb: Secondary | ICD-10-CM | POA: Insufficient documentation

## 2010-08-29 DIAGNOSIS — Z3009 Encounter for other general counseling and advice on contraception: Secondary | ICD-10-CM

## 2010-09-06 NOTE — Progress Notes (Signed)
Summary: referral to ortho  Phone Note Call from Patient   Summary of Call: needs a referral to dr. Jenetta Downer for right for tenditis and left hand is swollen and hurting. [phone] Initial call taken by: Rudene Anda,  August 29, 2010 1:46 PM  Follow-up for Phone Call        pls refer Follow-up by: Syliva Overman MD,  August 29, 2010 4:29 PM  Additional Follow-up for Phone Call Additional follow up Details #1::        pt was called and she let me know that dr. Jenetta Downer office had called her already with appt and time.  Additional Follow-up by: Rudene Anda,  August 30, 2010 12:04 PM  New Problems: HAND PAIN, RIGHT (ICD-729.5)   New Problems: HAND PAIN, RIGHT (ICD-729.5)

## 2010-09-06 NOTE — Assessment & Plan Note (Signed)
Summary: bp ck  Nurse Visit   Vital Signs:  Patient profile:   30 year old female Menstrual status:  irregular Weight:      169.50 pounds BP sitting:   120 / 76  (left arm)  Vitals Entered By: Adella Hare LPN (August 29, 2010 1:57 PM)  Allergies: 1)  ! Morphine  Medication Administration  Injection # 1:    Medication: Depo-Provera 150mg     Diagnosis: FAMILY PLANNING (ICD-V25.09)    Route: IM    Site: LUOQ gluteus    Exp Date: 06/14    Lot #: Sanda Linger    Mfr: [email or web address]    Patient tolerated injection without complications    Given by: Adella Hare LPN (August 29, 2010 1:57 PM)  Orders Added: 1)  Depo-Provera 150mg  [J1055] 2)  Admin of Therapeutic Inj  intramuscular or subcutaneous [96372]   Medication Administration  Injection # 1:    Medication: Depo-Provera 150mg     Diagnosis: FAMILY PLANNING (ICD-V25.09)    Route: IM    Site: LUOQ gluteus    Exp Date: 06/14    Lot #: Sanda Linger    Mfr: [email or web address]    Patient tolerated injection without complications    Given by: Adella Hare LPN (August 29, 2010 1:57 PM)  Orders Added: 1)  Depo-Provera 150mg  [J1055] 2)  Admin of Therapeutic Inj  intramuscular or subcutaneous [96372] blood pressure rechecked and I agree

## 2010-09-28 ENCOUNTER — Encounter: Payer: Self-pay | Admitting: Family Medicine

## 2010-10-02 NOTE — Letter (Signed)
Summary: medical release  medical release   Imported By: Lind Guest 09/28/2010 13:18:40  _____________________________________________________________________  External Attachment:    Type:   Image     Comment:   External Document

## 2010-10-09 LAB — URINALYSIS, ROUTINE W REFLEX MICROSCOPIC
Glucose, UA: NEGATIVE mg/dL
Ketones, ur: NEGATIVE mg/dL
Nitrite: NEGATIVE
Specific Gravity, Urine: 1.025 (ref 1.005–1.030)
pH: 6 (ref 5.0–8.0)

## 2010-10-09 LAB — URINE MICROSCOPIC-ADD ON

## 2010-10-09 LAB — DIFFERENTIAL
Basophils Absolute: 0 10*3/uL (ref 0.0–0.1)
Basophils Relative: 0 % (ref 0–1)
Eosinophils Absolute: 0.1 10*3/uL (ref 0.0–0.7)
Eosinophils Relative: 2 % (ref 0–5)
Monocytes Absolute: 0.5 10*3/uL (ref 0.1–1.0)

## 2010-10-09 LAB — URINE CULTURE: Colony Count: 50000

## 2010-10-09 LAB — CBC
HCT: 40.7 % (ref 36.0–46.0)
Hemoglobin: 14.2 g/dL (ref 12.0–15.0)
MCHC: 34.8 g/dL (ref 30.0–36.0)
MCV: 94.8 fL (ref 78.0–100.0)
Platelets: 325 10*3/uL (ref 150–400)
RDW: 14.1 % (ref 11.5–15.5)

## 2010-10-09 LAB — BASIC METABOLIC PANEL
BUN: 8 mg/dL (ref 6–23)
GFR calc Af Amer: >60 mL/min (ref >60)
GFR calc non Af Amer: >60 mL/min (ref >60)
Glucose, Bld: 101 mg/dL — ABNORMAL HIGH (ref 70–99)
Sodium: 135 mEq/L (ref 135–145)

## 2010-10-09 LAB — PREGNANCY, URINE: Preg Test, Ur: NEGATIVE

## 2010-10-20 ENCOUNTER — Emergency Department (HOSPITAL_COMMUNITY)
Admission: EM | Admit: 2010-10-20 | Discharge: 2010-10-20 | Disposition: A | Discharge status: Home / Self Care | Payer: Medicaid Other | Attending: Emergency Medicine | Admitting: Emergency Medicine

## 2010-10-20 DIAGNOSIS — E876 Hypokalemia: Secondary | ICD-10-CM | POA: Insufficient documentation

## 2010-10-20 DIAGNOSIS — G40802 Other epilepsy, not intractable, without status epilepticus: Secondary | ICD-10-CM | POA: Insufficient documentation

## 2010-10-20 LAB — RAPID URINE DRUG SCREEN, HOSP PERFORMED
Amphetamines: NOT DETECTED
Barbiturates: NOT DETECTED
Opiates: NOT DETECTED

## 2010-10-20 LAB — URINALYSIS, ROUTINE W REFLEX MICROSCOPIC
Bilirubin Urine: NEGATIVE
Glucose, UA: NEGATIVE mg/dL
Specific Gravity, Urine: <1.005 — ABNORMAL LOW (ref 1.005–1.030)

## 2010-10-20 LAB — DIFFERENTIAL
Basophils Absolute: 0 10*3/uL (ref 0.0–0.1)
Basophils Relative: 0 % (ref 0–1)
Eosinophils Absolute: 0 10*3/uL (ref 0.0–0.7)
Monocytes Relative: 10 % (ref 3–12)
Neutro Abs: 5.1 10*3/uL (ref 1.7–7.7)
Neutrophils Relative %: 53 % (ref 43–77)

## 2010-10-20 LAB — CBC
Hemoglobin: 13.2 g/dL (ref 12.0–15.0)
MCH: 32 pg (ref 26.0–34.0)
MCHC: 33.8 g/dL (ref 30.0–36.0)
Platelets: 339 10*3/uL (ref 150–400)
RBC: 4.13 MIL/uL (ref 3.87–5.11)

## 2010-10-20 LAB — BASIC METABOLIC PANEL
BUN: 8 mg/dL (ref 6–23)
Calcium: 8.5 mg/dL (ref 8.4–10.5)
Creatinine, Ser: 0.8 mg/dL (ref 0.4–1.2)
GFR calc Af Amer: >60 mL/min (ref >60)

## 2010-10-20 LAB — URINE MICROSCOPIC-ADD ON

## 2010-10-24 LAB — DIFFERENTIAL
Basophils Relative: 1 % (ref 0–1)
Lymphocytes Relative: 31 % (ref 12–46)
Lymphs Abs: 2.4 10*3/uL (ref 0.7–4.0)
Monocytes Relative: 7 % (ref 3–12)
Neutro Abs: 4.7 10*3/uL (ref 1.7–7.7)
Neutrophils Relative %: 60 % (ref 43–77)

## 2010-10-24 LAB — CBC
HCT: 40.9 % (ref 36.0–46.0)
Hemoglobin: 13.8 g/dL (ref 12.0–15.0)
MCHC: 33.8 g/dL (ref 30.0–36.0)
MCV: 96.8 fL (ref 78.0–100.0)
RBC: 4.23 MIL/uL (ref 3.87–5.11)
WBC: 7.7 10*3/uL (ref 4.0–10.5)

## 2010-10-24 LAB — BASIC METABOLIC PANEL
CO2: 25 mEq/L (ref 19–32)
Chloride: 104 mEq/L (ref 96–112)
GFR calc Af Amer: >60 mL/min (ref >60)
Potassium: 3.5 mEq/L (ref 3.5–5.1)
Sodium: 136 mEq/L (ref 135–145)

## 2010-10-24 LAB — TROPONIN I: Troponin I: <0.01 ng/mL (ref 0.00–0.06)

## 2010-10-24 LAB — CK TOTAL AND CKMB (NOT AT ARMC): Relative Index: INVALID (ref 0.0–2.5)

## 2010-10-27 LAB — DIFFERENTIAL
Lymphs Abs: 1.6 10*3/uL (ref 0.7–4.0)
Monocytes Absolute: 0.4 10*3/uL (ref 0.1–1.0)
Monocytes Relative: 8 % (ref 3–12)
Neutro Abs: 2.7 10*3/uL (ref 1.7–7.7)
Neutrophils Relative %: 57 % (ref 43–77)

## 2010-10-27 LAB — URINALYSIS, ROUTINE W REFLEX MICROSCOPIC
Glucose, UA: NEGATIVE mg/dL
pH: 6.5 (ref 5.0–8.0)

## 2010-10-27 LAB — CBC
Hemoglobin: 14.2 g/dL (ref 12.0–15.0)
MCV: 97.2 fL (ref 78.0–100.0)
RBC: 4.26 MIL/uL (ref 3.87–5.11)
WBC: 4.7 10*3/uL (ref 4.0–10.5)

## 2010-10-27 LAB — BASIC METABOLIC PANEL
Chloride: 107 mEq/L (ref 96–112)
Creatinine, Ser: 0.79 mg/dL (ref 0.4–1.2)
GFR calc Af Amer: >60 mL/min (ref >60)
Sodium: 135 mEq/L (ref 135–145)

## 2010-10-30 LAB — BASIC METABOLIC PANEL WITH GFR
BUN: 5 mg/dL — ABNORMAL LOW (ref 6–23)
CO2: 27 meq/L (ref 19–32)
Calcium: 9.2 mg/dL (ref 8.4–10.5)
Chloride: 103 meq/L (ref 96–112)
Creatinine, Ser: 0.8 mg/dL (ref 0.4–1.2)
GFR calc non Af Amer: >60 mL/min
Glucose, Bld: 78 mg/dL (ref 70–99)
Potassium: 3.6 meq/L (ref 3.5–5.1)
Sodium: 137 meq/L (ref 135–145)

## 2010-10-30 LAB — URINALYSIS, ROUTINE W REFLEX MICROSCOPIC
Glucose, UA: NEGATIVE mg/dL
Hgb urine dipstick: NEGATIVE
Protein, ur: NEGATIVE mg/dL
Urobilinogen, UA: 1 mg/dL (ref 0.0–1.0)

## 2010-10-30 LAB — CBC
Hemoglobin: 13.1 g/dL (ref 12.0–15.0)
RBC: 3.96 MIL/uL (ref 3.87–5.11)
RDW: 13.8 % (ref 11.5–15.5)
WBC: 9.1 10*3/uL (ref 4.0–10.5)

## 2010-10-30 LAB — PREGNANCY, URINE: Preg Test, Ur: POSITIVE

## 2010-10-30 LAB — DIFFERENTIAL
Basophils Relative: 0 % (ref 0–1)
Eosinophils Absolute: 0.1 10*3/uL (ref 0.0–0.7)
Eosinophils Relative: 1 % (ref 0–5)
Lymphs Abs: 3 10*3/uL (ref 0.7–4.0)
Monocytes Relative: 10 % (ref 3–12)
Neutrophils Relative %: 56 % (ref 43–77)

## 2010-10-30 LAB — HCG, QUANTITATIVE, PREGNANCY

## 2010-10-31 LAB — BASIC METABOLIC PANEL
BUN: 4 mg/dL — ABNORMAL LOW (ref 6–23)
BUN: 5 mg/dL — ABNORMAL LOW (ref 6–23)
CO2: 17 mEq/L — ABNORMAL LOW (ref 19–32)
CO2: 25 mEq/L (ref 19–32)
CO2: 28 mEq/L (ref 19–32)
Calcium: 8.4 mg/dL (ref 8.4–10.5)
Calcium: 8.6 mg/dL (ref 8.4–10.5)
Chloride: 106 mEq/L (ref 96–112)
Creatinine, Ser: 0.89 mg/dL (ref 0.4–1.2)
Creatinine, Ser: 0.92 mg/dL (ref 0.4–1.2)
Glucose, Bld: 122 mg/dL — ABNORMAL HIGH (ref 70–99)
Glucose, Bld: 80 mg/dL (ref 70–99)
Glucose, Bld: 88 mg/dL (ref 70–99)
Sodium: 139 mEq/L (ref 135–145)

## 2010-10-31 LAB — RAPID URINE DRUG SCREEN, HOSP PERFORMED
Amphetamines: NOT DETECTED
Cocaine: NOT DETECTED
Opiates: NOT DETECTED
Tetrahydrocannabinol: POSITIVE — AB

## 2010-10-31 LAB — DIFFERENTIAL
Basophils Absolute: 0 10*3/uL (ref 0.0–0.1)
Basophils Absolute: 0.1 10*3/uL (ref 0.0–0.1)
Basophils Relative: 1 % (ref 0–1)
Basophils Relative: 1 % (ref 0–1)
Eosinophils Relative: 1 % (ref 0–5)
Monocytes Absolute: 0.8 10*3/uL (ref 0.1–1.0)
Neutro Abs: 2.5 10*3/uL (ref 1.7–7.7)
Neutro Abs: 3.6 10*3/uL (ref 1.7–7.7)
Neutrophils Relative %: 39 % — ABNORMAL LOW (ref 43–77)

## 2010-10-31 LAB — BLOOD GAS, ARTERIAL
Acid-base deficit: 2.3 mmol/L — ABNORMAL HIGH (ref 0.0–2.0)
FIO2: 0.21 %
O2 Saturation: 98.1 %
Patient temperature: 37
pO2, Arterial: 95.8 mmHg (ref 80.0–100.0)

## 2010-10-31 LAB — CBC
HCT: 38.7 % (ref 36.0–46.0)
Hemoglobin: 13 g/dL (ref 12.0–15.0)
MCHC: 33.2 g/dL (ref 30.0–36.0)
MCHC: 33.7 g/dL (ref 30.0–36.0)
MCHC: 33.8 g/dL (ref 30.0–36.0)
MCV: 98.4 fL (ref 78.0–100.0)
Platelets: 302 10*3/uL (ref 150–400)
RDW: 13.8 % (ref 11.5–15.5)
RDW: 13.9 % (ref 11.5–15.5)
RDW: 14.5 % (ref 11.5–15.5)

## 2010-10-31 LAB — ACETAMINOPHEN LEVEL: Acetaminophen (Tylenol), Serum: <10 ug/mL — ABNORMAL LOW (ref 10–30)

## 2010-11-05 LAB — COMPREHENSIVE METABOLIC PANEL
Alkaline Phosphatase: 55 U/L (ref 39–117)
BUN: 5 mg/dL — ABNORMAL LOW (ref 6–23)
CO2: 27 mEq/L (ref 19–32)
Chloride: 108 mEq/L (ref 96–112)
GFR calc non Af Amer: >60 mL/min (ref >60)
Glucose, Bld: 79 mg/dL (ref 70–99)
Potassium: 3.8 mEq/L (ref 3.5–5.1)
Total Bilirubin: 0.5 mg/dL (ref 0.3–1.2)
Total Protein: 6.3 g/dL (ref 6.0–8.3)

## 2010-11-05 LAB — CBC
HCT: 39.8 % (ref 36.0–46.0)
Hemoglobin: 13.2 g/dL (ref 12.0–15.0)
RBC: 4.14 MIL/uL (ref 3.87–5.11)
RDW: 13.6 % (ref 11.5–15.5)

## 2010-11-05 LAB — DIFFERENTIAL
Basophils Absolute: 0.1 10*3/uL (ref 0.0–0.1)
Basophils Relative: 1 % (ref 0–1)
Eosinophils Absolute: 0.1 10*3/uL (ref 0.0–0.7)
Neutro Abs: 4.4 10*3/uL (ref 1.7–7.7)
Neutrophils Relative %: 52 % (ref 43–77)

## 2010-11-05 LAB — URINALYSIS, ROUTINE W REFLEX MICROSCOPIC
Glucose, UA: NEGATIVE mg/dL
Ketones, ur: NEGATIVE mg/dL
Leukocytes, UA: NEGATIVE
Nitrite: POSITIVE — AB
Protein, ur: NEGATIVE mg/dL

## 2010-11-05 LAB — LIPASE, BLOOD: Lipase: 16 U/L (ref 11–59)

## 2010-11-05 LAB — URINE MICROSCOPIC-ADD ON

## 2010-11-05 LAB — PREGNANCY, URINE: Preg Test, Ur: NEGATIVE

## 2010-11-27 ENCOUNTER — Ambulatory Visit: Payer: Medicaid Other

## 2010-12-03 ENCOUNTER — Emergency Department (HOSPITAL_COMMUNITY)
Admission: EM | Admit: 2010-12-03 | Discharge: 2010-12-03 | Disposition: A | Discharge status: Home / Self Care | Payer: Medicaid Other | Attending: Emergency Medicine | Admitting: Emergency Medicine

## 2010-12-03 DIAGNOSIS — S01502A Unspecified open wound of oral cavity, initial encounter: Secondary | ICD-10-CM | POA: Insufficient documentation

## 2010-12-03 DIAGNOSIS — F17200 Nicotine dependence, unspecified, uncomplicated: Secondary | ICD-10-CM | POA: Insufficient documentation

## 2010-12-03 DIAGNOSIS — R9431 Abnormal electrocardiogram [ECG] [EKG]: Secondary | ICD-10-CM | POA: Insufficient documentation

## 2010-12-03 DIAGNOSIS — G40909 Epilepsy, unspecified, not intractable, without status epilepticus: Secondary | ICD-10-CM | POA: Insufficient documentation

## 2010-12-03 DIAGNOSIS — Z79899 Other long term (current) drug therapy: Secondary | ICD-10-CM | POA: Insufficient documentation

## 2010-12-03 DIAGNOSIS — W503XXA Accidental bite by another person, initial encounter: Secondary | ICD-10-CM | POA: Insufficient documentation

## 2010-12-03 LAB — URINALYSIS, ROUTINE W REFLEX MICROSCOPIC
Bilirubin Urine: NEGATIVE
Nitrite: NEGATIVE
Specific Gravity, Urine: 1.03 (ref 1.005–1.030)
Urobilinogen, UA: 0.2 mg/dL (ref 0.0–1.0)
pH: 6 (ref 5.0–8.0)

## 2010-12-03 LAB — BASIC METABOLIC PANEL
CO2: 20 mEq/L (ref 19–32)
GFR calc Af Amer: >60 mL/min (ref >60)
GFR calc non Af Amer: >60 mL/min (ref >60)
Glucose, Bld: 111 mg/dL — ABNORMAL HIGH (ref 70–99)
Potassium: 3.5 mEq/L (ref 3.5–5.1)
Sodium: 133 mEq/L — ABNORMAL LOW (ref 135–145)

## 2010-12-03 LAB — URINE MICROSCOPIC-ADD ON

## 2010-12-03 LAB — CBC
Hemoglobin: 14.1 g/dL (ref 12.0–15.0)
MCH: 32.4 pg (ref 26.0–34.0)
MCV: 97.9 fL (ref 78.0–100.0)
RBC: 4.35 MIL/uL (ref 3.87–5.11)
WBC: 8.9 10*3/uL (ref 4.0–10.5)

## 2010-12-03 LAB — RAPID URINE DRUG SCREEN, HOSP PERFORMED
Barbiturates: NOT DETECTED
Benzodiazepines: NOT DETECTED

## 2010-12-03 LAB — DIFFERENTIAL
Lymphocytes Relative: 41 % (ref 12–46)
Lymphs Abs: 3.7 10*3/uL (ref 0.7–4.0)
Monocytes Relative: 9 % (ref 3–12)
Neutro Abs: 4.4 10*3/uL (ref 1.7–7.7)
Neutrophils Relative %: 49 % (ref 43–77)

## 2010-12-04 NOTE — H&P (Signed)
[name], [name]              ACCOUNT NO.:  [id number]   MEDICAL RECORD NO.:  [id number]          PATIENT TYPE:  INP   LOCATION:  A308                          FACILITY:  [facility]   PHYSICIAN:  Melissa L. Ladona Ridgel, MD  DATE OF BIRTH:  08-29-1980   DATE OF ADMISSION:  10/21/2008  DATE OF DISCHARGE:  LH                              HISTORY & PHYSICAL   PRIMARY CARE PHYSICIAN:  [name], M.D.   CHIEF COMPLAINT:  Fall with altered mental status.   HISTORY OF PRESENT ILLNESS:  The patient is a 30 year old African  American female who was witnessed by her children to fall down the  stairs.  The fall was of unclear etiology and when the patient arrived  in the emergency room, she was initially groggy in waking up and  subsequently became belligerent, requiring at least 6 people to hold her  down, and to treat her with Haldol and Ativan.  CT scan of the head and  neck were completed after she became more cooperative and there was no  evidence of fracture of the head or neck area.  No intracranial  hemorrhage was noted and no cervical fracture was noted.  The patient  slept for most of the time but did have evidence for biting her tongue  on the right side as well as a small area on the left.  Staff reported  to me that the patient had bitten her tongue before and indeed,  investigations of the old records reveals that the beginning of March,  the patient woke from sleep at night after having put her tooth through  her tongue.   MEDICATIONS:  The only medication that the patient is reported to be  taking at home was some cough medicine and her Depo-Provera.   REVIEW OF SYSTEMS:  Unable to be obtained secondary to the patient being  obtunded.   PAST MEDICAL HISTORY:  Old records state that she had no illnesses.  Her  spouse is not available to question. Please note that on the last  admission, the patient did bite a hole through her tongue on September 20, 2008.   PAST SURGICAL  HISTORY:  C-section.   ALLERGIES:  No known drug allergies.   SOCIAL HISTORY:  Cannot be obtained as the patient is obtunded.   FAMILY HISTORY:  Cannot be obtained as the patient is obtunded.   PHYSICAL EXAMINATION:  VITAL SIGNS:  Temperature 98.2, blood pressure  131/90, pulse 85, respirations 18, saturation 100%.  GENERAL:  The patient is obtunded but will open her eyes, look at me and  shake her head yes and no, however it is intermittent and unreliable.  The patient falls right back to sleep.  HEENT:  She appears normocephalic, atraumatic.  Pupils are equal, round  and reactive to light.  Extraocular muscles not able to be assessed  because the patient cannot follow commands and she is not tracking.  She  has anicteric sclerae.  Ears:  Tympanic membranes are intact.  There are  no lesions.  Examination of the nose:  Septum is midline, no  lesions or  trauma.  Examination of the mouth reveals right side significant  lacerations secondary to biting her tongue.  It appears that there may  be something on the left as well but it is difficult to assess as the  patient will not open her mouth wide.  CHEST: Decreased but clear to auscultation.  CARDIOVASCULAR:  Regular rate and rhythm, positive S1 and S2.  No S3,  S4, murmurs, rubs or gallops.  Apical impulse is not displaced.  No  heave or thrill.  ABDOMEN:  Soft, nontender, nondistended with no indication of pain when  palpating.  Good bowel sounds.  EXTREMITIES:  No clubbing, cyanosis, or edema.  NEUROLOGIC:  She is obtunded, status post benzodiazepines and Haldol to  control her outbursive behavior.   LABORATORY DATA:  Sodium 136, potassium 3.6, chloride 106, CO2 17, BUN  5, creatinine 0.89, glucose 192.  White count is 11.3, hemoglobin 14.7,  hematocrit 44.3 and platelets 302.  Her ethanol level is less than 5.  Urine pregnancy was negative.  X-ray shows no fractures, does however  show some right upper lobe lung process,  confirmed on CT to possibly be  a contusion.  Head CT and neck CT negative.   ASSESSMENT AND PLAN:  This is a 30 year old female who fell down the  stairs secondary to unknown cause.  The patient is cleared of all  traumatic lesions by the CT scans but cause for this needs to be  obtained.  The logical explanation is seizure activity as she had a  first episode where she actually bit through her tongue while she was  sleeping and the second episode in which she became belligerent and  acted as though she was postictal after the event.  1. Neurologic:  Will rule out seizures.  I  will check an EEG.  The ER      attending was going to attempt a lumbar puncture just  to make sure      we are not missing a subarachnoid hemorrhage.  If we are unable to      obtain that, I will speak with neurology regarding the odds that      this is seizure versus subarachnoid hemorrhage and determine if it      is appropriate to completely sedate the patient to obtain the      study.  2. Cardiovascular:  Stable, no palpitations, no fluctuations in blood      pressure.  3. Gastrointestinal:  The patient will be n.p.o. until she is more      awake and alert.  4. Genitourinary:  Foley catheter is in place and will be discontinued      when she wakes up.  5. Deep vein thrombosis prophylaxis:  SCDs for now.   Please note that I reviewed the case with Dr. Anne Hahn as the patient was  unable to tolerate lumbar puncture.  The plan is to treat her like this  is seizure which is what it seems to be with the postictal behavior.  Will load her with Dilantin or fosphenytoin and continue to monitor her  overnight.  Plan for EEG and neurological follow up can be obtained  outpatient with the potential for conversion to Lamictal for her  medication instead of Dilantin.  Please note that Pharmacy tells me that  we only have fosphenytoin and they would therefore be glad to dose that  but it appears that 1 gram IV tonight  would  be appropriate and  5 mg/kg  IV q24 hours would likely be the maintenance dose.   Total time on this case is approximately 1 hour.      Melissa L. Ladona Ridgel, MD  Electronically Signed     MLT/MEDQ  D:  10/22/2008  T:  10/23/2008  Job:  [id number]   cc:   [name], M.D.  Fax: [phone]

## 2010-12-04 NOTE — Discharge Summary (Signed)
NAMEMONSERRATE, BLASCHKE NO.:  [id number]   MEDICAL RECORD NO.:  [id number]          PATIENT TYPE:  INP   LOCATION:  A308                          FACILITY:  [facility]   PHYSICIAN:  Margaretmary Dys, M.D.DATE OF BIRTH:  02-14-1981   DATE OF ADMISSION:  11/02/2008  DATE OF DISCHARGE:  04/21/2010LH                               DISCHARGE SUMMARY   DISCHARGE DIAGNOSES:  1. Seizure disorder.  2. Hypokalemia.  3. History of chronic nicotine abuse.  4. Gastric ulcer with duodenal strictures, last      esophagogastroduodenoscopy of October 2007.   DISCHARGE MEDICATIONS:  1. Lamictal 200 mg p.o. once a day.  2. Depo Provera injections for her family birth control.   DIET:  Regular diet.   ACTIVITY:  As tolerated.  The patient was advised to return to the  emergency room if she has any more seizure activity.  This advice was  given in the presence of her husband, who lives with her.   CONSULTATION:  None.  The neurologist was not available to see.   HOSPITAL COURSE:  Ms. Davern is a 30 year old female who apparently fell  down the stairs.  This was witnessed by the children.   The patient's reason for fall was not clear, but the patient does not  remember falling.   Actually the patient was noted to be very agitated and belligerent with  administration of Haldol and Ativan in the emergency room.  A CT of the  head was negative, which shows no evidence of fracture, no intracranial  hemorrhage, cervical fracture was observed.    The patient apparently had some biting her tongue on the right side and  also had some dried saliva on the right side of her mouth and she also  bit her tongue.  All this was suspicious for a seizure disorder.   The patient's examination revealed a blood pressure of 131/90, pulse of  85, respiration was 18, saturation was 110, temperature was 98.2  degrees, Fahrenheit.  The patient was obtunded although would open her  eyes and follow some  commands intermittently.  She did have evidence of  trauma to her tongue which she had bitten.  Rest of her exam was  unremarkable.   The patient was then admitted to the intensive care unit with frequent  neuro checks.  The patient was loaded with fosphenytoin.  As the patient  does have a strong family history of seizure disorder, it appeared that  she may have suffered a seizure and this may actually not have been the  first time.  During the course of hospitalization, however, the patient  did not have any more seizure episodes.  She did fairly well with no  concerns.   The patient was then transferred out of intensive care unit when I saw  her.  The patient was then transitioned to Lamictal as per  recommendations of the neurologist on call at [facility] that  night.   A nicotine patch was then started on her.  Patient essentially had an  uneventful stay.  She was discharged and she  is going to be following up  with the neurologist, Dr. Gerilyn Pilgrim, in about 3-4 weeks.   FOLLOW-UP:  With Dr. Gerilyn Pilgrim.   PERTINENT LABORATORY DATA:  On admission, sodium was 136, potassium was  3.6, chloride of 106, CO2 was 17, BUN of 5, creatinine was 0.89, glucose  192.  White blood cell count 11.3, hemoglobin of 14.7, hematocrit 44.3,  platelet count was 302.  Alcohol level was less than 5.  Urine pregnancy  test was negative.  X-ray shows no fractures.  Right upper lobe lung  contusion was seen on CT scan.  Head and neck CT scan was negative for  any acute bleed or fractures.   DISPOSITION:  To home.   CONDITION ON DISCHARGE:  Satisfactory.      Margaretmary Dys, M.D.  Electronically Signed     AM/MEDQ  D:  11/09/2008  T:  11/09/2008  Job:  [id number]

## 2010-12-04 NOTE — Op Note (Signed)
NAMESHALAYNE, LEACH NO.:  [id number]   MEDICAL RECORD NO.:  [id number]          PATIENT TYPE:  AMB   LOCATION:  DAY                           FACILITY:  [facility]   PHYSICIAN:  Tilda Burrow, M.D. DATE OF BIRTH:  [birth year]   DATE OF PROCEDURE:  DATE OF DISCHARGE:  11/29/2008                               OPERATIVE REPORT   PREOPERATIVE DIAGNOSIS:  Missed abortion.   POSTOPERATIVE DIAGNOSIS:  Missed abortion.   PROCEDURE:  Suction dilation and curettage.   SURGEON:  Tilda Burrow, MD   ASSISTANT:  None.   ANESTHESIA:  Paracervical block with MAC with laryngeal mask anesthesia  in place.   COMPLICATIONS:  None.   FINDINGS:  Blood, old clots, tissue, and products of conception  consistent with missed AB.   INDICATIONS:  A 30 year old female gravida 4, para 3-0-0-3, now 3-0-1-3  presenting to the emergency room where confirmation of pregnancy was  performed with ultrasound showing fetal products and blood clots in the  uterine cavity.   DETAILS OF PROCEDURE:  The patient was taken to the operating room,  prepped and draped for vaginal procedure with IV antibiotics  administered.  A time-out was conducted.  Laryngeal mask anesthesia was  in placed.  Paracervical block was applied after prepping on the cervix,  and cervix grasped with a single-tooth tenaculum and anterior lip  sounded to 13 cm in the anteflexed position and then dilated to 58-  [place] allowing the introduction of a curved 10 mm suction curette,  which was then used in a circumferential fashion, obtaining generous  amounts of tissue, old blood clot and resulted in significant reduction  in the size of the uterine cavity over suction curette procedure's  duration.  A smooth sharp curretage quickly confirmed that the uterine  cavity was smaller with uniform gritty feel obtained in all 4 quadrants.  Brief recheck curretage obtained no additional tissue fragments.  So at  this point the  procedure considered successfully completed.  There was  no suspicion of complications.  The patient  tolerated the procedure well with minimal blood loss intraoperatively.  The patient will go to recovery room in stable condition.  Blood type  was confirmed to the emergency room as Rh+ earlier today.   The patient desires no future birth control as she will be attempting to  get pregnant in the near future.      Tilda Burrow, M.D.  Electronically Signed     JVF/MEDQ  D:  11/29/2008  T:  11/30/2008  Job:  [id number]

## 2010-12-04 NOTE — Group Therapy Note (Signed)
[name], [name] NO.:  [id number]   MEDICAL RECORD NO.:  [id number]          PATIENT TYPE:  INP   LOCATION:  IC08                          FACILITY:  [facility]   PHYSICIAN:  Margaretmary Dys, M.D.DATE OF BIRTH:  [birth year]   DATE OF PROCEDURE:  10/22/2008  DATE OF DISCHARGE:                                 PROGRESS NOTE   SUBJECTIVE:  The patient feels much better.  She denies any seizure  activity since being in the hospital.  She has not had any weakness or  seizure activities.  She denies any fecal or urinary incontinence when  she had the episode when she woke up in the morning with blood on her  pillow.  The patient reports history of seizures in her brother who died  from a gunshot wound.  The patient's urine was positive for marijuana.   OBJECTIVE:  GENERAL:  Conscious, alert, comfortable, not in acute  distress.  Well-oriented to time, place and person.  VITAL SIGNS:  Blood pressure is 121/70, pulse is 83, respirations 21,  temperature 99.4 degrees Fahrenheit, oxygen saturation was 100% on room  air.  HEENT:  Normocephalic, atraumatic.  Oral mucosa was moist.  No exudates  were noted.  The patient had an ulcer on the plantar right tongue  consistent with an area of a previous bite.  NECK:  Supple.  No JVD or lymphadenopathy.  LUNGS:  Lungs were clear clinically with good air entry bilaterally.  HEART:  S1-S2 regular.  No S3, S4, gallops or rubs.  ABDOMEN:  Abdomen was soft, nontender.  Bowel sounds positive.  No  masses palpable.  EXTREMITIES:  No edema.  No calf induration or tenderness noted.  CNS:  Exam was grossly intact with no focal neurological deficits.   LABORATORY DATA:  White blood count 7.4, hemoglobin of 13, hematocrit  38.7, platelet count was 262 with no left shift.  Sodium 139, potassium  3.2, chloride of 108, CO2 was 25, glucose is 80, BUN 4, creatinine was  0.7, calcium is 8.4, phosphorus was 3.2, magnesium is 1.9.   ASSESSMENT  AND PLAN:  1. Seizure disorder.  The patient's symptoms and history are fairly      consistent.  The patient did not have any fecal or urinary      incontinence.  Will continue on fosphenytoin at this time.  2. Hypokalemia.  Will replace her potassium today.  3. Will discontinue her telemetry and transfer her to 300 and increase      activity level.  4. Will place the patient on nicotine patch at 14 mg topically once a      day.  Smoking cessation was discussed with      her.  5. Menstrual cycle.  The patient had a history of gastric ulcer with      duodenal strictures with the last EGD in October of 2007.      Margaretmary Dys, M.D.  Electronically Signed     AM/MEDQ  D:  10/22/2008  T:  10/22/2008  Job:  [id number]

## 2010-12-07 NOTE — Consult Note (Signed)
[facility]  Patient:    [name], [name] Visit Number: [id number] MRN: [id number]          Service Type: OBS Location: 4A A415 01 Attending Physician:  Tilda Burrow Dictated by:   Langley Gauss, M.D. Proc. Date: 05/09/01 Admit Date:  05/09/2001 Discharge Date: 05/10/2001                            Consultation Report  OBSERVATION PERIOD EXTENDED TO:  May 10, 2001.  HOSPITAL DISCHARGE DAY MANAGEMENT GREATER THAN:  30 minutes and coded as 99239.  DISCHARGE DIAGNOSES: 1. A 26-week intrauterine pregnancy. 2. Previous low transverse cesarean section. 3. Threatened preterm labor. 4. Bacterial vaginitis.  DISCHARGE MEDICATIONS:  Flagyl 500 mg p.o. b.i.d. x 7 days.  Signs and symptoms of preterm labor as well as spontaneous rupture of membranes are reviewed with the patient.  Cultures, wet prep, performed and interpreted by Dr. Langley Gauss reveals the presence of bacterial vaginitis, clue cells are identified.  There is rare WBCs.  No motile Trichomonas identified.  GC and chlamydia culture performed from the endocervix, results are currently pending.  OTHER LABORATORY DATA:  The patient is noted to have a history of a positive drug screen.  On this visit she is noted to be positive for THC.  The patient during her prenatal course did admit to a use of recreational drugs specifically those containing THC.  OBSTETRIC HISTORY:  In 1995 a low transverse cesarean section.  In 2000 a low transverse cesarean section.  In 1999 a missed abortion not requiring D&C. The patient has no history of preterm labor or delivery with either of the pregnancies.  HOSPITAL COURSE:  A 30 year old gravida 4, para 2, at 55 and 5/[redacted] weeks gestation who awoke May 09, 2001, and began having uterine contractions during the day.  She described these as tightening and pain in the abdomen. The patient presented to [facility] at 18:00 at which time  initially she was noted to be contracting q.3-5 minutes and moderate in intensity.  Drug screen is obtained. IV fluids were started.  The patient received 1 liter of fluids over 2 hours and also 25 mg of IM Phenergan.  This had excellent therapeutic results with marked decrease in the strength and intensity of the uterine contractions both noted by the monitor and by patient report.  Nursing staff had examined the patient and the cervix was noted to be closed. The presenting part was out of the pelvis.  The cervix was very long and firm. However, the patient did have at 20:45 renewed onset of uterine contractions at q.5 minutes in mild intensity, thus at that time she was treated with terbutaline 0.25 mg subcutaneous, and also received 4 mg IM morphine.  This effectively tocolyzed the patient with near complete cessation of uterine activity. The patient rested comfortably throughout the evening.  She continuED to have a reassuring fetal heart rate as cold be ascertained at 25 and 5/7ths weeks gestation.  At 05:00 the patient again had onset of some uterine contractions which were treated with 0.25 mg of subcutaneous terbutaline.  On my evaluation on May 10, 2001, the patient is noted to have very rare, and very mild isolated uterine contractions no the monitor.  This is consistent with patients clinical history.  An OB ultrasound is performed. Limited OB ultrasound performed by Dr. Langley Gauss.  Infant is noted to be in a complete  breech presentation.  Parameters obtained, BPD, and femur length consistent with 26 to [redacted] weeks gestation.  Amniotic fluid volume is noted to be subjectively normal.  There is noted to be a fundal posterior placenta.  Anatomic survey is not performed.  Sterile speculum examination is performed. Cervix is noted to be completely closed.  Firm consistency.  She is noted to have a somewhat malodorous vaginal discharge.  No evidence of any vaginal bleeding.   GC and chlamydia cultures performed. Wet prep also performed.  ASSESSMENT:  Threatened preterm labor with near complete resolution of uterine activity at present.  In consideration of this the patient will be discharged at home but will not require any tocolytics.  She, however, is treated for the bacterial vaginosis with 500 mg p.o. b.i.d. x 7 of Flagyl. Dictated by:   Langley Gauss, M.D. Attending Physician:  Tilda Burrow DD:  05/10/01 TD:  05/11/01 Job: [id number]

## 2010-12-07 NOTE — Discharge Summary (Signed)
[facility]  Patient:    [name], [name] Visit Number: [id number] MRN: [id number]          Service Type: MED Location: 4A A426 01 Attending Physician:  Tilda Burrow Dictated by:   Zerita Boers, C.N.M. Admit Date:  08/10/2001 Discharge Date: 08/13/2001   CC:         [facility]   Discharge Summary  ADMITTING DIAGNOSES:  1.  Pregnancy at 38-1/[redacted] weeks gestation.  2.  Prior cesarean section, for repeat cesarean section.  DISCHARGE DIAGNOSES:  1.  Pregnancy at 38-1/[redacted] weeks gestation.  2.  Prior cesarean section, repeat cesarean section.  OPERATION/PROCEDURE:  Low transverse repeat cesarean section.  Delivered a viable female infant weighing 6 pounds 13 ounces without any complications.  ANESTHESIA:  Spinal.  DISCHARGE MEDICATIONS:  1.  Chromagen Forte b.i.d.  2.  Motrin 800 mg one p.o. q.6h p.r.n. cramping.  3.  Tylox one to two p.o. q.4h p.r.n. pain.  HOSPITAL COURSE:  Hospital course has essentially been uneventful.  She is anemic, and is going to be placed on iron therapy, but she had an uneventful hospital course.  FOLLOW-UP:  She is to follow up Monday for staple removal.  LABORATORY DATA:  Discharge hemoglobin was 9.7, hematocrit 27.8.  DISCHARGE PHYSICAL EXAMINATION:  HEART:  Regular rhythm and rate.  LUNGS:  Clear to auscultation bilaterally.  ABDOMEN:  Soft, nontender.  Bowel sounds active.  Fundus firm at umbilicus. Incision is dry and intact, without any signs of infection.  No redness, no drainage.  GU:  Lochia, scant amount.  EXTREMITIES:  No edema.  PLAN:  We are doing to discharge her home.  She is to follow up Monday for staple removal. Dictated by:   Zerita Boers, C.N.M. Attending Physician:  Tilda Burrow DD:  08/13/01 TD:  08/14/01 Job: [id number]

## 2010-12-07 NOTE — Op Note (Signed)
[facility]  Patient:    [name], [name] Visit Number: [id number] MRN: [id number]          Service Type: MED Location: 4A A426 01 Attending Physician:  Tilda Burrow Dictated by:   Christin Bach, M.D. Admit Date:  08/10/2001   CC:         Lilyan Punt, M.D.   Operative Report  PREOPERATIVE DIAGNOSIS:  Pregnancy, 38-1/[redacted] weeks gestation. Prior cesarean section.  Trial of labor.  POSTOPERATIVE DIAGNOSIS:  Pregnancy, 38-1/[redacted] weeks gestation.  Prior cesarean section.  Trial of labor, delivered.  Polyhydramnios.  PROCEDURE:  Repeat low transverse cervical cesarean section.  SURGEON:  Christin Bach, M.D.  ASSISTANT:  None.  ANESTHESIA:  Spinal, CRNA.  COMPLICATIONS:  None.  FINDINGS:  2,000 cc of anmniotic fluid.  Healthy, 6 lb, 13.9 oz, female infant, Apgars 8 and 9, cared for by Lilyan Punt, M.D.  INDICATIONS:  Two prior cesareans with ballotable presenting part out of pelvis.  DETAILS OF THE PROCEDURE:  The patient was taken to the operating room and prepped and draped in the usual fashion for lower abdominal surgery.  A Pfannenstiel-type incision was performed with excision of the old skin cicatrix.  The patient had minimal subcu fat.  The fascia was somewhat fibrotic to the muscle layer.  The bladder flap was high on the anterior abdominal wall, but we were able to avoid entering the bladder.  A transverse lower uterine incision was made through a very thin portion of lower uterine segment after bladder flap was developed.  A vacuum extractor was used to guide the vertex through the incision with fundal pressure applied as the primary propulsive force.  The patient had easy delivery of the infant with clear amniotic fluid encountered, generous volumes of it, and suctioning of the pharynx was performed with bulb suctioning.  The cord was clamped, and the infant was passed to Lilyan Punt, M.D., and placenta delivered after cord blood  samples obtained.  Antibiotic irrigation of the uterus with Ancef was followed by a single layer of running locking closure of the uterine incision and then 2-0 chromic closure of the bladder flap.  The abdomen was irrigated again, 2-0 chromic used on the anterior peritoneal closure, and 0 Vicryl closure of the fascia performed.  The subcu tissues required several interrupted 2-0 plain sutures to reapproximate the subcu fatty tissue, and then staple closure of the skin completed the procedure.  The patient tolerated the procedure and went to the recovery room in good condition. Dictated by:   Christin Bach, M.D. Attending Physician:  Tilda Burrow DD:  08/10/01 TD:  08/11/01 Job: [id number]

## 2010-12-21 ENCOUNTER — Emergency Department (HOSPITAL_COMMUNITY)
Admission: EM | Admit: 2010-12-21 | Discharge: 2010-12-21 | Disposition: A | Discharge status: Home / Self Care | Payer: Medicaid Other | Attending: Emergency Medicine | Admitting: Emergency Medicine

## 2010-12-21 DIAGNOSIS — R569 Unspecified convulsions: Secondary | ICD-10-CM | POA: Insufficient documentation

## 2010-12-21 LAB — BASIC METABOLIC PANEL
CO2: 20 mEq/L (ref 19–32)
Chloride: 101 mEq/L (ref 96–112)
Creatinine, Ser: 0.84 mg/dL (ref 0.4–1.2)
GFR calc Af Amer: >60 mL/min (ref >60)
Potassium: 4.1 mEq/L (ref 3.5–5.1)
Sodium: 136 mEq/L (ref 135–145)

## 2010-12-21 LAB — URINALYSIS, ROUTINE W REFLEX MICROSCOPIC
Glucose, UA: NEGATIVE mg/dL
Ketones, ur: NEGATIVE mg/dL
Leukocytes, UA: NEGATIVE
Nitrite: NEGATIVE
Specific Gravity, Urine: 1.03 (ref 1.005–1.030)
pH: 5.5 (ref 5.0–8.0)

## 2010-12-21 LAB — CBC
HCT: 42 % (ref 36.0–46.0)
MCHC: 34 g/dL (ref 30.0–36.0)
Platelets: ADEQUATE 10*3/uL (ref 150–400)
RDW: 13.5 % (ref 11.5–15.5)
WBC: 5.5 10*3/uL (ref 4.0–10.5)

## 2010-12-21 LAB — URINE MICROSCOPIC-ADD ON

## 2010-12-21 LAB — DIFFERENTIAL
Basophils Relative: 0 % (ref 0–1)
Eosinophils Absolute: 0 10*3/uL (ref 0.0–0.7)
Eosinophils Relative: 1 % (ref 0–5)
Lymphs Abs: 1.8 10*3/uL (ref 0.7–4.0)
Neutrophils Relative %: 58 % (ref 43–77)

## 2010-12-21 LAB — RAPID URINE DRUG SCREEN, HOSP PERFORMED
Barbiturates: POSITIVE — AB
Cocaine: NOT DETECTED

## 2010-12-21 LAB — ETHANOL: Alcohol, Ethyl (B): <11 mg/dL — ABNORMAL HIGH (ref 0–10)

## 2010-12-21 LAB — POCT PREGNANCY, URINE: Preg Test, Ur: NEGATIVE

## 2011-03-07 ENCOUNTER — Ambulatory Visit (INDEPENDENT_AMBULATORY_CARE_PROVIDER_SITE_OTHER): Payer: Medicaid Other | Admitting: Family Medicine

## 2011-03-07 ENCOUNTER — Encounter: Payer: Self-pay | Admitting: Family Medicine

## 2011-03-07 VITALS — BP 130/80 | HR 80 | Ht 62.0 in | Wt 157.1 lb

## 2011-03-07 DIAGNOSIS — R634 Abnormal weight loss: Secondary | ICD-10-CM

## 2011-03-07 DIAGNOSIS — R51 Headache: Secondary | ICD-10-CM

## 2011-03-07 DIAGNOSIS — R569 Unspecified convulsions: Secondary | ICD-10-CM

## 2011-03-07 DIAGNOSIS — F329 Major depressive disorder, single episode, unspecified: Secondary | ICD-10-CM

## 2011-03-07 DIAGNOSIS — F17200 Nicotine dependence, unspecified, uncomplicated: Secondary | ICD-10-CM

## 2011-03-07 DIAGNOSIS — R197 Diarrhea, unspecified: Secondary | ICD-10-CM

## 2011-03-07 MED ORDER — CITALOPRAM HYDROBROMIDE 10 MG PO TABS: 10.0000 mg | ORAL_TABLET | Freq: Every day | ORAL | Status: DC

## 2011-03-07 MED ORDER — DIPHENOXYLATE-ATROPINE 2.5-0.025 MG PO TABS: 1.0000 | ORAL_TABLET | Freq: Four times a day (QID) | ORAL | Status: DC | PRN

## 2011-03-07 NOTE — Patient Instructions (Addendum)
CPE in 6 weeks.   TSH and chem 7 and stool samples for diarreah evaluation  New med for depression. [facility] are [facility] it is important that you go

## 2011-03-07 NOTE — Assessment & Plan Note (Signed)
deteriorated 

## 2011-03-07 NOTE — Progress Notes (Signed)
  Subjective:    Patient ID: [name], female    DOB: 05-26-1981, 30 y.o.   MRN: [id number]  HPI 4 month h/o depression with above symptoms, states today she thought about killing herself , has no plans and because of her kids she would not do that. Wants mental health referral, does not want to be hospitalized, is not homicidal, and denies hallucinations. C/o fatigue, poor appetite , poor sleep, unintentional weight loss. Denies recent seizures, still smokes, no plan to quit now, stressed   Review of Systems Denies recent fever or chills. Denies sinus pressure, nasal congestion, ear pain or sore throat. Denies chest congestion, productive cough or wheezing. Denies chest pains, palpitations and leg swelling Denies abdominal pain, nausea, vomiting or constipation.   Denies dysuria, frequency, hesitancy or incontinence. Denies joint pain, swelling and limitation in mobility. Denies skin break down or rash.        Objective:   Physical Exam Patient alert and oriented and in no cardiopulmonary distress.  HEENT: No facial asymmetry, EOMI, no sinus tenderness,  oropharynx pink and moist.  Neck supple no adenopathy.  Chest: Clear to auscultation bilaterally.  CVS: S1, S2 no murmurs, no S3.  ABD: Soft non tender. Bowel sounds normal.  Ext: No edema  MS: Adequate ROM spine, shoulders, hips and knees.  Skin: Intact, no ulcerations or rash noted.  Psych: Good eye contact,flat  affect. Memory intact , depressed appearing.  CNS: CN 2-12 intact, power, tone and sensation normal throughout.        Assessment & Plan:

## 2011-03-10 ENCOUNTER — Encounter: Payer: Self-pay | Admitting: Family Medicine

## 2011-03-10 DIAGNOSIS — R519 Headache, unspecified: Secondary | ICD-10-CM | POA: Insufficient documentation

## 2011-03-10 NOTE — Assessment & Plan Note (Signed)
Controlled and followed by neurology 

## 2011-03-10 NOTE — Assessment & Plan Note (Signed)
Chronic diareah with weight loss, lomotil prescribed, and stool samples to be sent

## 2011-03-10 NOTE — Assessment & Plan Note (Signed)
Uncontrolled and increased due to poor sleep and tension. Neurologic exam is within normal, will address underlying issues

## 2011-03-10 NOTE — Assessment & Plan Note (Signed)
Deteriorated, encouraged to reduce use and try to quit

## 2011-03-11 ENCOUNTER — Emergency Department (HOSPITAL_COMMUNITY)
Admission: EM | Admit: 2011-03-11 | Discharge: 2011-03-11 | Disposition: A | Discharge status: Home / Self Care | Payer: Medicaid Other | Attending: Emergency Medicine | Admitting: Emergency Medicine

## 2011-03-11 ENCOUNTER — Encounter (HOSPITAL_COMMUNITY): Payer: Self-pay | Admitting: Emergency Medicine

## 2011-03-11 DIAGNOSIS — X58XXXA Exposure to other specified factors, initial encounter: Secondary | ICD-10-CM | POA: Insufficient documentation

## 2011-03-11 DIAGNOSIS — T148XXA Other injury of unspecified body region, initial encounter: Secondary | ICD-10-CM | POA: Insufficient documentation

## 2011-03-11 DIAGNOSIS — F17200 Nicotine dependence, unspecified, uncomplicated: Secondary | ICD-10-CM | POA: Insufficient documentation

## 2011-03-11 DIAGNOSIS — G40909 Epilepsy, unspecified, not intractable, without status epilepticus: Secondary | ICD-10-CM | POA: Insufficient documentation

## 2011-03-11 DIAGNOSIS — R569 Unspecified convulsions: Secondary | ICD-10-CM

## 2011-03-11 NOTE — ED Notes (Signed)
Pt sleeping daughter noticed she was having a seizure and her son called 911. seizure lasting 2-3 minutes, hx of same

## 2011-03-11 NOTE — ED Notes (Signed)
Pt self ambulated to rest room and back with a steady gait 

## 2011-03-11 NOTE — ED Provider Notes (Addendum)
History     CSN: [id number] Arrival date & time: 03/11/2011  5:14 AM  Chief Complaint  Patient presents with  . Seizures   HPI Comments: Seen [id number].Followed by Dr. Gerilyn Pilgrim, neurologist.  Patient is a 30 y.o. female presenting with seizures. The history is provided by the patient.  Seizures  This is a new (seizure d/o x 2 years. Last seizure 2 months ago. Daughter heard her having a seizure and called 911. She was alert and oriented when EMS arrived.) problem. The current episode started less than 1 hour ago. The problem has been resolved. There was 1 seizure. Duration: short duration, time unknown. Pertinent negatives include no sleepiness, no confusion, no headaches, no speech difficulty, no visual disturbance, no neck stiffness, no sore throat, no chest pain and no cough. Characteristics include rhythmic jerking. There has been no fever. There were no medications administered prior to arrival.    Past Medical History  Diagnosis Date  . Seizures   . Depression     Past Surgical History  Procedure Date  . Cesarean section     x 3    History reviewed. No pertinent family history.  History  Substance Use Topics  . Smoking status: Current Everyday Smoker  . Smokeless tobacco: Not on file  . Alcohol Use: Yes     occasionally    OB History    Grav Para Term Preterm Abortions TAB SAB Ect Mult Living                  Review of Systems  HENT: Negative for sore throat.   Eyes: Negative for visual disturbance.  Respiratory: Negative for cough.   Cardiovascular: Negative for chest pain.  Neurological: Positive for seizures. Negative for speech difficulty and headaches.  Psychiatric/Behavioral: Negative for confusion.  All other systems reviewed and are negative.    Physical Exam  BP 127/86  Pulse 95  Temp(Src) 98.6 F (37 C) (Oral)  Resp 20  SpO2 97%  Physical Exam  Nursing note and vitals reviewed. Constitutional: She is oriented to person, place, and time. She  appears well-nourished. No distress.  HENT:  Head: Normocephalic and atraumatic.  Right Ear: External ear normal.  Nose: Nose normal.  Mouth/Throat: Oropharynx is clear and moist. No oropharyngeal exudate.       Contusion to right side of tongue.  Eyes: EOM are normal.  Neck: Normal range of motion. Neck supple.  Cardiovascular: Normal rate, normal heart sounds and intact distal pulses.   Pulmonary/Chest: Effort normal and breath sounds normal.  Abdominal: Soft.  Musculoskeletal: Normal range of motion.  Neurological: She is alert and oriented to person, place, and time. She has normal reflexes. No cranial nerve deficit. Coordination normal.  Skin: Skin is warm and dry.    ED Course  Procedures   Patient with seizure disorder, witnessed seizure while sleeping. On lamictal and keppra. Last seizure was in June, seen in the ER, labs unremarkable except for [facility]. Patient is at her baseline.Pt stable in ED with no significant deterioration in condition. Patient will call Dr. Ronal Fear office today for advise re possible medicine changes. MDM Reviewed: previous chart, nursing note and vitals        Nicoletta Dress. Colon Branch, MD 03/11/11 1191  Nicoletta Dress. Colon Branch, MD 03/11/11 [phone]

## 2011-03-11 NOTE — ED Notes (Signed)
Pt called for a ride home will wait in room with her children for her ride

## 2011-03-21 ENCOUNTER — Telehealth: Payer: Self-pay | Admitting: Family Medicine

## 2011-03-26 NOTE — Telephone Encounter (Signed)
Called patient, left message.

## 2011-03-27 NOTE — Telephone Encounter (Signed)
Called patient, left message.

## 2011-04-25 ENCOUNTER — Encounter: Payer: Self-pay | Admitting: Family Medicine

## 2011-04-30 ENCOUNTER — Encounter: Payer: Medicaid Other | Admitting: Family Medicine

## 2011-06-07 ENCOUNTER — Telehealth: Payer: Self-pay | Admitting: Family Medicine

## 2011-06-07 NOTE — Telephone Encounter (Signed)
There is no cream OTC for outbreak itch, patient aware

## 2011-07-08 ENCOUNTER — Telehealth: Payer: Self-pay | Admitting: Family Medicine

## 2011-07-08 ENCOUNTER — Emergency Department (HOSPITAL_COMMUNITY)
Admission: EM | Admit: 2011-07-08 | Discharge: 2011-07-08 | Disposition: A | Discharge status: Home / Self Care | Payer: Medicaid Other | Attending: Emergency Medicine | Admitting: Emergency Medicine

## 2011-07-08 ENCOUNTER — Other Ambulatory Visit: Payer: Self-pay | Admitting: Family Medicine

## 2011-07-08 ENCOUNTER — Encounter (HOSPITAL_COMMUNITY): Payer: Self-pay

## 2011-07-08 DIAGNOSIS — K031 Abrasion of teeth: Secondary | ICD-10-CM | POA: Insufficient documentation

## 2011-07-08 DIAGNOSIS — R51 Headache: Secondary | ICD-10-CM | POA: Insufficient documentation

## 2011-07-08 DIAGNOSIS — S0510XA Contusion of eyeball and orbital tissues, unspecified eye, initial encounter: Secondary | ICD-10-CM | POA: Insufficient documentation

## 2011-07-08 DIAGNOSIS — R569 Unspecified convulsions: Secondary | ICD-10-CM | POA: Insufficient documentation

## 2011-07-08 DIAGNOSIS — F3289 Other specified depressive episodes: Secondary | ICD-10-CM | POA: Insufficient documentation

## 2011-07-08 DIAGNOSIS — A6000 Herpesviral infection of urogenital system, unspecified: Secondary | ICD-10-CM | POA: Insufficient documentation

## 2011-07-08 DIAGNOSIS — R404 Transient alteration of awareness: Secondary | ICD-10-CM | POA: Insufficient documentation

## 2011-07-08 DIAGNOSIS — X58XXXA Exposure to other specified factors, initial encounter: Secondary | ICD-10-CM | POA: Insufficient documentation

## 2011-07-08 DIAGNOSIS — F329 Major depressive disorder, single episode, unspecified: Secondary | ICD-10-CM | POA: Insufficient documentation

## 2011-07-08 DIAGNOSIS — F17200 Nicotine dependence, unspecified, uncomplicated: Secondary | ICD-10-CM | POA: Insufficient documentation

## 2011-07-08 HISTORY — DX: Herpesviral infection, unspecified: B00.9

## 2011-07-08 HISTORY — DX: Dorsalgia, unspecified: M54.9

## 2011-07-08 HISTORY — DX: Anogenital (venereal) warts: A63.0

## 2011-07-08 LAB — RAPID URINE DRUG SCREEN, HOSP PERFORMED
Amphetamines: NOT DETECTED
Barbiturates: NOT DETECTED
Benzodiazepines: NOT DETECTED
Cocaine: NOT DETECTED
Tetrahydrocannabinol: POSITIVE — AB

## 2011-07-08 LAB — DIFFERENTIAL
Basophils Relative: 0 % (ref 0–1)
Lymphs Abs: 3 10*3/uL (ref 0.7–4.0)
Monocytes Absolute: 0.7 10*3/uL (ref 0.1–1.0)
Monocytes Relative: 9 % (ref 3–12)
Neutro Abs: 4.4 10*3/uL (ref 1.7–7.7)

## 2011-07-08 LAB — CBC
HCT: 39.7 % (ref 36.0–46.0)
Hemoglobin: 13.4 g/dL (ref 12.0–15.0)
MCH: 32.4 pg (ref 26.0–34.0)
MCHC: 33.8 g/dL (ref 30.0–36.0)
RBC: 4.13 MIL/uL (ref 3.87–5.11)

## 2011-07-08 LAB — BASIC METABOLIC PANEL
BUN: 8 mg/dL (ref 6–23)
Chloride: 100 mEq/L (ref 96–112)
Creatinine, Ser: 0.88 mg/dL (ref 0.50–1.10)
GFR calc Af Amer: >90 mL/min (ref >90)
Glucose, Bld: 103 mg/dL — ABNORMAL HIGH (ref 70–99)

## 2011-07-08 LAB — ETHANOL: Alcohol, Ethyl (B): <11 mg/dL (ref 0–11)

## 2011-07-08 LAB — URINALYSIS, ROUTINE W REFLEX MICROSCOPIC
Bilirubin Urine: NEGATIVE
Glucose, UA: NEGATIVE mg/dL
Hgb urine dipstick: NEGATIVE
Ketones, ur: NEGATIVE mg/dL
Nitrite: NEGATIVE
pH: 6 (ref 5.0–8.0)

## 2011-07-08 MED ORDER — SODIUM CHLORIDE 0.9 % IV BOLUS (SEPSIS)
500.0000 mL | Freq: Once | INTRAVENOUS | Status: AC
  Administered 2011-07-08: 500 mL via INTRAVENOUS

## 2011-07-08 MED ORDER — ONDANSETRON HCL 4 MG/2ML IJ SOLN
4.0000 mg | Freq: Once | INTRAMUSCULAR | Status: AC
  Administered 2011-07-08: 4 mg via INTRAVENOUS
  Filled 2011-07-08: qty 2

## 2011-07-08 MED ORDER — SODIUM CHLORIDE 0.9 % IV SOLN: INTRAVENOUS | Status: DC

## 2011-07-08 MED ORDER — HYDROMORPHONE HCL PF 1 MG/ML IJ SOLN
1.0000 mg | Freq: Once | INTRAMUSCULAR | Status: AC
  Administered 2011-07-08: 1 mg via INTRAVENOUS
  Filled 2011-07-08: qty 1
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
## 2011-07-08 NOTE — Telephone Encounter (Signed)
She will need to go to [place] or [place]  In that case. Ask if she is able to do this , if so, go ahead and do the referral I am entering.  If she cannot travel that far just document . Let her know it will likely be several months before she can see another doc, so she needs to continue with Dr Gerilyn Pilgrim until she gets that appt

## 2011-07-08 NOTE — ED Notes (Signed)
Patient is alert and oriented x 4 with respirations even and unlabored.  NAD at this time.  Discharge instructions reviewed with patient and patient verbalized understanding.  Pt ambulated to lobby with steady gait, and friend to transport pt home.
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
## 2011-07-30 ENCOUNTER — Other Ambulatory Visit: Payer: Self-pay | Admitting: Family Medicine

## 2011-09-03 ENCOUNTER — Other Ambulatory Visit: Payer: Self-pay

## 2011-09-03 ENCOUNTER — Emergency Department (HOSPITAL_COMMUNITY): Payer: Medicaid Other

## 2011-09-03 ENCOUNTER — Emergency Department (HOSPITAL_COMMUNITY)
Admission: EM | Admit: 2011-09-03 | Discharge: 2011-09-03 | Disposition: A | Discharge status: Home / Self Care | Payer: Medicaid Other | Attending: Emergency Medicine | Admitting: Emergency Medicine

## 2011-09-03 DIAGNOSIS — Z79899 Other long term (current) drug therapy: Secondary | ICD-10-CM | POA: Insufficient documentation

## 2011-09-03 DIAGNOSIS — G40909 Epilepsy, unspecified, not intractable, without status epilepticus: Secondary | ICD-10-CM | POA: Insufficient documentation

## 2011-09-03 DIAGNOSIS — R51 Headache: Secondary | ICD-10-CM | POA: Insufficient documentation

## 2011-09-03 DIAGNOSIS — R569 Unspecified convulsions: Secondary | ICD-10-CM

## 2011-09-03 DIAGNOSIS — D649 Anemia, unspecified: Secondary | ICD-10-CM | POA: Insufficient documentation

## 2011-09-03 LAB — COMPREHENSIVE METABOLIC PANEL
AST: 18 U/L (ref 0–37)
Albumin: 3.7 g/dL (ref 3.5–5.2)
Alkaline Phosphatase: 66 U/L (ref 39–117)
BUN: 5 mg/dL — ABNORMAL LOW (ref 6–23)
Chloride: 102 mEq/L (ref 96–112)
Creatinine, Ser: 0.73 mg/dL (ref 0.50–1.10)
Potassium: 3.7 mEq/L (ref 3.5–5.1)
Total Bilirubin: 0.3 mg/dL (ref 0.3–1.2)
Total Protein: 7.3 g/dL (ref 6.0–8.3)

## 2011-09-03 LAB — DIFFERENTIAL
Basophils Absolute: 0 10*3/uL (ref 0.0–0.1)
Basophils Relative: 0 % (ref 0–1)
Eosinophils Absolute: 0 10*3/uL (ref 0.0–0.7)
Neutro Abs: 5.1 10*3/uL (ref 1.7–7.7)
Neutrophils Relative %: 67 % (ref 43–77)

## 2011-09-03 LAB — CBC
MCH: 31.6 pg (ref 26.0–34.0)
MCHC: 32.3 g/dL (ref 30.0–36.0)
RDW: 17.5 % — ABNORMAL HIGH (ref 11.5–15.5)

## 2011-09-03 MED ORDER — HYDROMORPHONE HCL PF 1 MG/ML IJ SOLN
1.0000 mg | Freq: Once | INTRAMUSCULAR | Status: AC
  Administered 2011-09-03: 1 mg via INTRAVENOUS
  Filled 2011-09-03: qty 1

## 2011-09-03 MED ORDER — SODIUM CHLORIDE 0.9 % IV SOLN
Freq: Once | INTRAVENOUS | Status: AC
  Administered 2011-09-03: 17:00:00 via INTRAVENOUS

## 2011-09-03 MED ORDER — ONDANSETRON HCL 4 MG/2ML IJ SOLN
4.0000 mg | Freq: Once | INTRAMUSCULAR | Status: AC
  Administered 2011-09-03: 4 mg via INTRAVENOUS
  Filled 2011-09-03: qty 2

## 2011-09-03 NOTE — ED Notes (Signed)
Pt alert and oriented x4. Respirations even and unlabored, bilateral symmetrical rise and fall of chest. Skin warm and dry. In no acute distress. Denies needs.   

## 2011-09-03 NOTE — ED Provider Notes (Signed)
History     CSN: [id number]  Arrival date & time 09/03/11  1612   First MD Initiated Contact with Patient 09/03/11 1647      Chief Complaint  Patient presents with  . Seizures    (Consider location/radiation/quality/duration/timing/severity/associated sxs/prior treatment) Patient is a 31 y.o. female presenting with seizures and headaches. The history is provided by the patient (The patient states that she had a seizure today. She states she has about one to 2 seizures a month. She complains of a headache minor.). No language interpreter was used.  Seizures  This is a recurrent problem. The current episode started 1 to 2 hours ago. The problem has been resolved. There was 1 seizure. The most recent episode lasted less than 30 seconds. Pertinent negatives include no sleepiness, no confusion, no headaches, no chest pain, no cough and no diarrhea. Characteristics do not include eye blinking, eye deviation or bowel incontinence. The episode was witnessed. There was no sensation of an aura present. The seizures did not continue in the ED. The seizure(s) had no focality. Possible causes do not include missed seizure meds. There has been no fever. There were no medications administered prior to arrival.  Headache  This is a new problem. The current episode started 1 to 2 hours ago. The problem occurs constantly. The problem has not changed since onset.Associated with: seizures. The pain is located in the left unilateral region. The quality of the pain is described as dull. The pain is at a severity of 4/10. The pain is moderate. The pain does not radiate. Pertinent negatives include no anorexia. She has tried nothing for the symptoms. The treatment provided no relief.    Past Medical History  Diagnosis Date  . Seizures   . Depression   . Back pain   . Genital warts     herpes  . Herpes     Past Surgical History  Procedure Date  . Cesarean section     x 3    No family history on  file.  History  Substance Use Topics  . Smoking status: Current Everyday Smoker  . Smokeless tobacco: Not on file  . Alcohol Use: Yes     occasionally    OB History    Grav Para Term Preterm Abortions TAB SAB Ect Mult Living                  Review of Systems  Constitutional: Negative for fatigue.  HENT: Negative for congestion, sinus pressure and ear discharge.   Eyes: Negative for discharge.  Respiratory: Negative for cough.   Cardiovascular: Negative for chest pain.  Gastrointestinal: Negative for abdominal pain, diarrhea, anorexia and bowel incontinence.  Genitourinary: Negative for frequency and hematuria.  Musculoskeletal: Negative for back pain.  Skin: Negative for rash.  Neurological: Positive for seizures. Negative for headaches.  Hematological: Negative.  Negative for adenopathy.  Psychiatric/Behavioral: Negative for hallucinations and confusion.    Allergies  Morphine  Home Medications   Current Outpatient Rx  Name Route Sig Dispense Refill  . CITALOPRAM HYDROBROMIDE 10 MG PO TABS Oral Take 1 tablet (10 mg total) by mouth daily. 30 tablet 2  . FAMCICLOVIR 250 MG PO TABS  TAKE 1 TABLET BY MOUTH TWICE DAILY 60 tablet 4  . LAMOTRIGINE 200 MG PO TABS Oral Take 200 mg by mouth at bedtime.     . METHOCARBAMOL 500 MG PO TABS Oral Take 500 mg by mouth 4 (four) times daily as needed. For muscle spasms  BP 117/72  Pulse 70  Temp(Src) 97.9 F (36.6 C) (Oral)  Resp 19  SpO2 99%  Physical Exam  Constitutional: She is oriented to person, place, and time. She appears well-developed.  HENT:  Head: Normocephalic and atraumatic.  Eyes: Conjunctivae and EOM are normal. No scleral icterus.  Neck: Neck supple. No thyromegaly present.  Cardiovascular: Normal rate and regular rhythm.  Exam reveals no gallop and no friction rub.   No murmur heard. Pulmonary/Chest: No stridor. She has no wheezes. She has no rales. She exhibits no tenderness.  Abdominal: She exhibits  no distension. There is no tenderness. There is no rebound.  Musculoskeletal: Normal range of motion. She exhibits no edema.  Lymphadenopathy:    She has no cervical adenopathy.  Neurological: She is oriented to person, place, and time. Coordination normal.  Skin: No rash noted. No erythema.  Psychiatric: She has a normal mood and affect. Her behavior is normal.    ED Course  Procedures (including critical care time)  Labs Reviewed  CBC - Abnormal; Notable for the following:    RBC 2.56 (*)    Hemoglobin 8.1 (*)    HCT 25.1 (*)    RDW 17.5 (*)    All other components within normal limits  COMPREHENSIVE METABOLIC PANEL - Abnormal; Notable for the following:    BUN 5 (*)    All other components within normal limits  DIFFERENTIAL   Ct Head Wo Contrast  09/03/2011  *RADIOLOGY REPORT*  Clinical Data: Seizure  CT HEAD WITHOUT CONTRAST  Technique:  Contiguous axial images were obtained from the base of the skull through the vertex without contrast.  Comparison: 10/27/2008  Findings: The brain has a normal appearance without evidence for hemorrhage, infarction, hydrocephalus, or mass lesion.  There is no extra axial fluid collection.  The skull and paranasal sinuses are normal.  IMPRESSION: No acute intracranial abnormalities.  Original Report Authenticated By: Rosealee Albee, M.D.     1. Seizure   2. Anemia       MDM          Benny Lennert, MD 09/03/11 [id number]  Benny Lennert, MD 09/03/11 2306
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
## 2011-09-03 NOTE — ED Notes (Signed)
Pt was angry and cursing in room in front of her children. Talking about she wanted her pain medications, at the same time the nurse was preparing her medications.

## 2011-09-03 NOTE — ED Notes (Signed)
Patient stable upon discharge. Discharged to care of husband

## 2011-09-03 NOTE — ED Notes (Signed)
[id number]<NF> Expected date:<BR> Expected time:<BR> Means of arrival:<BR> Comments:<BR> EMS seizure

## 2011-09-04 ENCOUNTER — Encounter (HOSPITAL_COMMUNITY): Payer: Self-pay | Admitting: Emergency Medicine

## 2011-09-05 ENCOUNTER — Encounter: Payer: Self-pay | Admitting: Family Medicine

## 2011-09-05 ENCOUNTER — Ambulatory Visit (INDEPENDENT_AMBULATORY_CARE_PROVIDER_SITE_OTHER): Payer: Medicaid Other | Admitting: Family Medicine

## 2011-09-05 ENCOUNTER — Telehealth: Payer: Self-pay

## 2011-09-05 VITALS — BP 110/80 | HR 91 | Resp 16 | Ht 62.0 in | Wt 168.0 lb

## 2011-09-05 DIAGNOSIS — Z139 Encounter for screening, unspecified: Secondary | ICD-10-CM

## 2011-09-05 DIAGNOSIS — R5383 Other fatigue: Secondary | ICD-10-CM

## 2011-09-05 DIAGNOSIS — D649 Anemia, unspecified: Secondary | ICD-10-CM

## 2011-09-05 DIAGNOSIS — F17200 Nicotine dependence, unspecified, uncomplicated: Secondary | ICD-10-CM

## 2011-09-05 DIAGNOSIS — S01512A Laceration without foreign body of oral cavity, initial encounter: Secondary | ICD-10-CM

## 2011-09-05 DIAGNOSIS — E663 Overweight: Secondary | ICD-10-CM

## 2011-09-05 DIAGNOSIS — R569 Unspecified convulsions: Secondary | ICD-10-CM

## 2011-09-05 DIAGNOSIS — R5381 Other malaise: Secondary | ICD-10-CM

## 2011-09-05 MED ORDER — CLINDAMYCIN HCL 150 MG PO CAPS: 150.0000 mg | ORAL_CAPSULE | Freq: Three times a day (TID) | ORAL | Status: AC
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
## 2011-09-05 NOTE — Patient Instructions (Addendum)
cpe in 6 to 8 weeks.  We will try to get an appt with Dr Ronal Fear office very soon, pls stop by after your lab today to see if we have an appt   Med sent to the pharmacy since you bit your tongue.   Please think about quitting smoking.  This is very important for your health.  Consider setting a quit date, then cutting back or switching brands to prepare to stop.  Also think of the money you will save every day by not smoking.  Quick Tips to Quit Smoking: Fix a date i.e. keep a date in mind from when you would not touch a tobacco product to smoke  Keep yourself busy and block your mind with work loads or reading books or watching movies in malls where smoking is not allowed  Vanish off the things which reminds you about smoking for example match box, or your favorite lighter, or the pipe you used for smoking, or your favorite jeans and shirt with which you used to enjoy smoking, or the club where you used to do smoking  Try to avoid certain people places and incidences where and with whom smoking is a common factor to add on  Praise yourself with some token gifts from the money you saved by stopping smoking  Anti Smoking teams are there to help you. Join their programs  Anti-smoking Gums are there in many medical shops. Try them to quit smoking   Side-effects of Smoking: Disease caused by smoking cigarettes are emphysema, bronchitis, heart failures  Premature death  Cancer is the major side effect of smoking  Heart attacks and strokes are the quick effects of smoking causing sudden death  Some smokers lives end up with limbs amputated  Breathing problem or fast breathing is another side effect of smoking  Due to more intakes of smokes, carbon mono-oxide goes into your brain and other muscles of the body which leads to swelling of the veins and blockage to the air passage to lungs  Carbon monoxide blocks blood vessels which leads to blockage in the flow of blood to different major body  organs like heart lungs and thus leads to attacks and deaths  During pregnancy smoking is very harmful and leads to premature birth of the infant, spontaneous abortions, low weight of the infant during birth  Fat depositions to narrow and blocked blood vessels causing heart attacks  In many cases cigarette smoking caused infertility in men

## 2011-09-06 ENCOUNTER — Other Ambulatory Visit: Payer: Self-pay | Admitting: Family Medicine

## 2011-09-06 DIAGNOSIS — E559 Vitamin D deficiency, unspecified: Secondary | ICD-10-CM | POA: Insufficient documentation

## 2011-09-06 LAB — CBC WITH DIFFERENTIAL/PLATELET
Eosinophils Absolute: 0.1 10*3/uL (ref 0.0–0.7)
Eosinophils Relative: 1 % (ref 0–5)
HCT: 41.1 % (ref 36.0–46.0)
Lymphocytes Relative: 48 % — ABNORMAL HIGH (ref 12–46)
Lymphs Abs: 3.5 10*3/uL (ref 0.7–4.0)
MCH: 32.3 pg (ref 26.0–34.0)
MCV: 96.9 fL (ref 78.0–100.0)
Monocytes Absolute: 0.8 10*3/uL (ref 0.1–1.0)
Platelets: 376 10*3/uL (ref 150–400)
RBC: 4.24 MIL/uL (ref 3.87–5.11)
WBC: 7.3 10*3/uL (ref 4.0–10.5)

## 2011-09-06 LAB — BASIC METABOLIC PANEL
BUN: 5 mg/dL — ABNORMAL LOW (ref 6–23)
CO2: 23 mEq/L (ref 19–32)
Chloride: 103 mEq/L (ref 96–112)
Creat: 0.99 mg/dL (ref 0.50–1.10)
Glucose, Bld: 62 mg/dL — ABNORMAL LOW (ref 70–99)
Potassium: 4.2 mEq/L (ref 3.5–5.3)

## 2011-09-06 MED ORDER — BUTALBITAL-APAP-CAFFEINE 50-325-40 MG PO TABS: 1.0000 | ORAL_TABLET | Freq: Four times a day (QID) | ORAL | Status: DC | PRN

## 2011-09-06 MED ORDER — VITAMIN D3 1.25 MG (50000 UT) PO CAPS: 50000.0000 [IU] | ORAL_CAPSULE | ORAL | Status: DC

## 2011-09-06 NOTE — Telephone Encounter (Signed)
pls let pt know med sent in

## 2011-09-06 NOTE — Telephone Encounter (Signed)
Pt aware.

## 2011-09-06 NOTE — Assessment & Plan Note (Signed)
Unchanged, counseled to quit 

## 2011-09-06 NOTE — Progress Notes (Signed)
  Subjective:    Patient ID: [name], female    DOB: 04-06-1981, 31 y.o.   MRN: [id number]  HPI The PT is here for follow up and re-evaluation of chronic medical conditions, medication management and review of any available recent lab and radiology data.  Preventive health is updated, specifically  Cancer screening and Immunization.  Last pap was in 2008, she has been getting depo from gynae office Pt sent from the dentist's office where she had a witnessed seizure 2 days ago,, she has a long standing seizure disorder, was hospitalized at [facility] for 1 week last Fall as a result of uncontrolled seizures, which has persisted . During her recent seizure she sustained a laceration to her tongue      Review of Systems See HPI Denies recent fever or chills. Denies sinus pressure, nasal congestion, ear pain or sore throat. Denies chest congestion, productive cough or wheezing. Denies chest pains, palpitations and leg swelling Denies abdominal pain, nausea, vomiting,diarrhea or constipation.   Denies dysuria, frequency, hesitancy or incontinence. Denies joint pain, swelling and limitation in mobility.  Denies depression, anxiety or insomnia. Denies skin break down or rash.       Objective:   Physical Exam Patient alert and oriented and in no cardiopulmonary distress.  HEENT: No facial asymmetry, EOMI, no sinus tenderness,  oropharynx pink and moist.  Neck supple no adenopathy.Laceration to right side of tongue  Chest: Clear to auscultation bilaterally.Decreased air entry  CVS: S1, S2 no murmurs, no S3.  ABD: Soft non tender. Bowel sounds normal.  Ext: No edema  MS: Adequate ROM spine, shoulders, hips and knees.  Skin: Intact, no ulcerations or rash noted.  Psych: Good eye contact, normal affect. Memory intact not anxious or depressed appearing.  CNS: CN 2-12 intact, power, tone and sensation normal throughout.        Assessment & Plan:

## 2011-09-06 NOTE — Assessment & Plan Note (Signed)
Unchanged. Patient re-educated about  the importance of commitment to a  minimum of 150 minutes of exercise per week. The importance of healthy food choices with portion control discussed. Encouraged to start a food diary, count calories and to consider  joining a support group. Sample diet sheets offered. Goals set by the patient for the next several months.    

## 2011-09-21 ENCOUNTER — Encounter (HOSPITAL_COMMUNITY): Payer: Self-pay | Admitting: *Deleted

## 2011-09-21 ENCOUNTER — Emergency Department (HOSPITAL_COMMUNITY): Payer: Medicaid Other

## 2011-09-21 ENCOUNTER — Emergency Department (HOSPITAL_COMMUNITY)
Admission: EM | Admit: 2011-09-21 | Discharge: 2011-09-21 | Disposition: A | Discharge status: Home / Self Care | Payer: Medicaid Other | Attending: Emergency Medicine | Admitting: Emergency Medicine

## 2011-09-21 DIAGNOSIS — G40909 Epilepsy, unspecified, not intractable, without status epilepticus: Secondary | ICD-10-CM | POA: Insufficient documentation

## 2011-09-21 DIAGNOSIS — M79609 Pain in unspecified limb: Secondary | ICD-10-CM | POA: Insufficient documentation

## 2011-09-21 DIAGNOSIS — F3289 Other specified depressive episodes: Secondary | ICD-10-CM | POA: Insufficient documentation

## 2011-09-21 DIAGNOSIS — F329 Major depressive disorder, single episode, unspecified: Secondary | ICD-10-CM | POA: Insufficient documentation

## 2011-09-21 DIAGNOSIS — W269XXA Contact with unspecified sharp object(s), initial encounter: Secondary | ICD-10-CM | POA: Insufficient documentation

## 2011-09-21 DIAGNOSIS — IMO0002 Reserved for concepts with insufficient information to code with codable children: Secondary | ICD-10-CM

## 2011-09-21 DIAGNOSIS — Z79899 Other long term (current) drug therapy: Secondary | ICD-10-CM | POA: Insufficient documentation

## 2011-09-21 DIAGNOSIS — S61209A Unspecified open wound of unspecified finger without damage to nail, initial encounter: Secondary | ICD-10-CM | POA: Insufficient documentation

## 2011-09-21 MED ORDER — LIDOCAINE HCL (PF) 1 % IJ SOLN
INTRAMUSCULAR | Status: AC
  Filled 2011-09-21: qty 10

## 2011-09-21 MED ORDER — TETANUS-DIPHTH-ACELL PERTUSSIS 5-2.5-18.5 LF-MCG/0.5 IM SUSP
0.5000 mL | Freq: Once | INTRAMUSCULAR | Status: AC
  Administered 2011-09-21: 0.5 mL via INTRAMUSCULAR
  Filled 2011-09-21: qty 0.5
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
## 2011-09-21 NOTE — ED Notes (Signed)
MD at bedside. 

## 2011-09-21 NOTE — Discharge Instructions (Signed)
Stitches will need to be removed in 7 days. You may return to the ER or see your doctor.   Laceration Care, Adult A laceration is a cut that goes through all layers of the skin. The cut goes into the tissue beneath the skin. HOME CARE For stitches (sutures) or staples:  Keep the cut clean and dry.   If you have a bandage (dressing), change it at least once a day. Change the bandage if it gets wet or dirty, or as told by your doctor.   Wash the cut with soap and water 2 times a day. Rinse the cut with water. Pat it dry with a clean towel.   Put a thin layer of medicated cream on the cut as told by your doctor.   You may shower after the first 24 hours. Do not soak the cut in water until the stitches are removed.   Only take medicines as told by your doctor.   Have your stitches or staples removed as told by your doctor.  For skin adhesive strips:  Keep the cut clean and dry.   Do not get the strips wet. You may take a bath, but be careful to keep the cut dry.   If the cut gets wet, pat it dry with a clean towel.   The strips will fall off on their own. Do not remove the strips that are still stuck to the cut.  For wound glue:  You may shower or take baths. Do not soak or scrub the cut. Do not swim. Avoid heavy sweating until the glue falls off on its own. After a shower or bath, pat the cut dry with a clean towel.   Do not put medicine on your cut until the glue falls off.   If you have a bandage, do not put tape over the glue.   Avoid lots of sunlight or tanning lamps until the glue falls off. Put sunscreen on the cut for the first year to reduce your scar.   The glue will fall off on its own. Do not pick at the glue.  You may need a tetanus shot if:  You cannot remember when you had your last tetanus shot.   You have never had a tetanus shot.  If you need a tetanus shot and you choose not to have one, you may get tetanus. Sickness from tetanus can be serious. GET HELP  RIGHT AWAY IF:   Your pain does not get better with medicine.   Your arm, hand, leg, or foot loses feeling (numbness) or changes color.   Your cut is bleeding.   Your joint feels weak, or you cannot use your joint.   You have painful lumps on your body.   Your cut is red, puffy (swollen), or painful.   You have a red line on the skin near the cut.   You have yellowish-white fluid (pus) coming from the cut.   You have a fever.   You have a bad smell coming from the cut or bandage.   Your cut breaks open before or after stitches are removed.   You notice something coming out of the cut, such as wood or glass.   You cannot move a finger or toe.  MAKE SURE YOU:   Understand these instructions.   Will watch your condition.   Will get help right away if you are not doing well or get worse.  Document Released: 12/25/2007 Document Revised: 03/20/2011 Document Reviewed:  01/01/2011 ExitCare Patient Information 2012 [place], [place].

## 2011-09-24 ENCOUNTER — Other Ambulatory Visit (HOSPITAL_COMMUNITY)
Admission: RE | Admit: 2011-09-24 | Discharge: 2011-09-24 | Disposition: A | Discharge status: Home / Self Care | Payer: Medicaid Other | Source: Ambulatory Visit | Attending: Family Medicine | Admitting: Family Medicine

## 2011-09-24 ENCOUNTER — Encounter: Payer: Self-pay | Admitting: Family Medicine

## 2011-09-24 ENCOUNTER — Ambulatory Visit (INDEPENDENT_AMBULATORY_CARE_PROVIDER_SITE_OTHER): Payer: Medicaid Other | Admitting: Family Medicine

## 2011-09-24 VITALS — BP 116/74 | HR 77 | Resp 18 | Ht 62.0 in | Wt 169.0 lb

## 2011-09-24 DIAGNOSIS — Z309 Encounter for contraceptive management, unspecified: Secondary | ICD-10-CM

## 2011-09-24 DIAGNOSIS — IMO0002 Reserved for concepts with insufficient information to code with codable children: Secondary | ICD-10-CM

## 2011-09-24 DIAGNOSIS — Z3049 Encounter for surveillance of other contraceptives: Secondary | ICD-10-CM

## 2011-09-24 DIAGNOSIS — Z0000 Encounter for general adult medical examination without abnormal findings: Secondary | ICD-10-CM

## 2011-09-24 DIAGNOSIS — Z01419 Encounter for gynecological examination (general) (routine) without abnormal findings: Secondary | ICD-10-CM | POA: Insufficient documentation

## 2011-09-24 DIAGNOSIS — N912 Amenorrhea, unspecified: Secondary | ICD-10-CM

## 2011-09-24 LAB — POCT URINE PREGNANCY: Preg Test, Ur: NEGATIVE

## 2011-09-24 MED ORDER — CEPHALEXIN 500 MG PO CAPS: 500.0000 mg | ORAL_CAPSULE | Freq: Two times a day (BID) | ORAL | Status: AC

## 2011-09-24 MED ORDER — MEDROXYPROGESTERONE ACETATE 150 MG/ML IM SUSP
150.0000 mg | Freq: Once | INTRAMUSCULAR | Status: AC
  Administered 2011-09-24: 150 mg via INTRAMUSCULAR

## 2011-09-24 NOTE — Patient Instructions (Addendum)
F/u in 6 month  Nurse visit for depo in 12 weeks  Please keep appt at ED for f/u of the laceration on your right index finger.  Antibiotics are prescribed for 5 days  You need to ensure you know when your next Depo provera injection is due before you leave and keep that appointment  Keflex antibiotic is sent in for 5 days, you need to take this  Depo provera today if pregnancy test is negative

## 2011-09-26 ENCOUNTER — Emergency Department (HOSPITAL_COMMUNITY)
Admission: EM | Admit: 2011-09-26 | Discharge: 2011-09-26 | Disposition: A | Discharge status: Home / Self Care | Payer: Medicaid Other | Attending: Emergency Medicine | Admitting: Emergency Medicine

## 2011-09-26 ENCOUNTER — Encounter (HOSPITAL_COMMUNITY): Payer: Self-pay | Admitting: *Deleted

## 2011-09-26 DIAGNOSIS — F17200 Nicotine dependence, unspecified, uncomplicated: Secondary | ICD-10-CM | POA: Insufficient documentation

## 2011-09-26 DIAGNOSIS — G40909 Epilepsy, unspecified, not intractable, without status epilepticus: Secondary | ICD-10-CM | POA: Insufficient documentation

## 2011-09-26 DIAGNOSIS — IMO0001 Reserved for inherently not codable concepts without codable children: Secondary | ICD-10-CM

## 2011-09-26 DIAGNOSIS — Z79899 Other long term (current) drug therapy: Secondary | ICD-10-CM | POA: Insufficient documentation

## 2011-09-26 DIAGNOSIS — F329 Major depressive disorder, single episode, unspecified: Secondary | ICD-10-CM | POA: Insufficient documentation

## 2011-09-26 DIAGNOSIS — Z4800 Encounter for change or removal of nonsurgical wound dressing: Secondary | ICD-10-CM | POA: Insufficient documentation

## 2011-09-26 DIAGNOSIS — F3289 Other specified depressive episodes: Secondary | ICD-10-CM | POA: Insufficient documentation

## 2011-09-26 LAB — WET PREP BY MOLECULAR PROBE
Candida species: POSITIVE — AB
Gardnerella vaginalis: POSITIVE — AB
Trichomonas vaginosis: NEGATIVE

## 2011-09-26 MED ORDER — IBUPROFEN 800 MG PO TABS: 800.0000 mg | ORAL_TABLET | Freq: Three times a day (TID) | ORAL | Status: AC | PRN

## 2011-09-26 MED ORDER — IBUPROFEN 800 MG PO TABS
800.0000 mg | ORAL_TABLET | Freq: Once | ORAL | Status: AC
  Administered 2011-09-26: 800 mg via ORAL
  Filled 2011-09-26: qty 1

## 2011-09-26 NOTE — ED Notes (Signed)
Lac to index finger of right hand, pt now c/o right hand pain and pain is worse

## 2011-09-26 NOTE — ED Notes (Signed)
Discharge instructions reviewed with pt, questions answered. Pt verbalized understanding.  

## 2011-09-26 NOTE — Discharge Instructions (Signed)
Wound Check  Your wound appears healthy today. Your wound will heal gradually over time. Eventually a scar will form that will fade with time.  FACTORS THAT AFFECT SCAR FORMATION:   People differ in the severity in which they scar.   Scar severity varies according to location, size, and the traits you inherited from your parents (genetic predisposition).   Irritation to the wound from infection, rubbing, or chemical exposure will increase the amount of scar formation.  HOME CARE INSTRUCTIONS    If you were given a dressing, you should change it at least once a day or as instructed by your caregiver. If the bandage sticks, soak it off with a solution of hydrogen peroxide.   If the bandage becomes wet, dirty, or develops a bad smell, change it as soon as possible.   Look for signs of infection.   Only take over-the-counter or prescription medicines for pain, discomfort, or fever as directed by your caregiver.  SEEK IMMEDIATE MEDICAL CARE IF:    You have redness, swelling, or increasing pain in the wound.   You notice pus coming from the wound.   You have a fever.   You notice a bad smell coming from the wound or dressing.  Document Released: 04/13/2004 Document Revised: 06/27/2011 Document Reviewed: 07/08/2005  ExitCare Patient Information 2012 [employer].

## 2011-09-27 NOTE — ED Provider Notes (Signed)
History     CSN: [id number]  Arrival date & time 09/26/11  2128   First MD Initiated Contact with Patient 09/26/11 2208      Chief Complaint  Patient presents with  . Wound Check  . Hand Pain    (Consider location/radiation/quality/duration/timing/severity/associated sxs/prior treatment) Patient is a 31 y.o. female presenting with wound check and hand pain. The history is provided by the patient.  Wound Check  She was treated in the ED 5 to 10 days ago. Previous treatment in the ED includes laceration repair. Treatments since wound repair include oral antibiotics and a wound recheck (She was seen by her pcp 2 days ago who put her on keflex abx.  Patient reports persistent pain at the laceration site). There has been no drainage from the wound. There is no redness present. There is no swelling present. The pain has worsened. There is difficulty moving the extremity or digit due to pain.  Hand Pain Associated symptoms include arthralgias. Pertinent negatives include no abdominal pain, chest pain, congestion, fever, headaches, joint swelling, nausea, neck pain, numbness, rash, sore throat or weakness.    Past Medical History  Diagnosis Date  . Seizures   . Depression   . Back pain   . Genital warts     herpes  . Herpes     Past Surgical History  Procedure Date  . Cesarean section     x 3    History reviewed. No pertinent family history.  History  Substance Use Topics  . Smoking status: Current Everyday Smoker  . Smokeless tobacco: Not on file  . Alcohol Use: Yes     occasionally    OB History    Grav Para Term Preterm Abortions TAB SAB Ect Mult Living                  Review of Systems  Constitutional: Negative for fever.  HENT: Negative for congestion, sore throat and neck pain.   Eyes: Negative.   Respiratory: Negative for chest tightness and shortness of breath.   Cardiovascular: Negative for chest pain.  Gastrointestinal: Negative for nausea and abdominal  pain.  Genitourinary: Negative.   Musculoskeletal: Positive for arthralgias. Negative for joint swelling.  Skin: Positive for wound. Negative for color change and rash.  Neurological: Negative for dizziness, weakness, light-headedness, numbness and headaches.  Hematological: Negative.   Psychiatric/Behavioral: Negative.     Allergies  Morphine  Home Medications   Current Outpatient Rx  Name Route Sig Dispense Refill  . CEPHALEXIN 500 MG PO CAPS Oral Take 1 capsule (500 mg total) by mouth 2 (two) times daily. 10 capsule 0  . FAMCICLOVIR 250 MG PO TABS  TAKE 1 TABLET BY MOUTH TWICE DAILY 60 tablet 4  . LAMOTRIGINE ER 250 MG PO TB24 Oral Take 500 mg by mouth at bedtime.    [name] MEDROXYPROGESTERONE ACETATE 150 MG/ML IM SUSP Intramuscular Inject 150 mg into the muscle every 3 (three) months.    . IBUPROFEN 800 MG PO TABS Oral Take 1 tablet (800 mg total) by mouth every 8 (eight) hours as needed for pain. 21 tablet 0    BP 125/82  Pulse 79  Temp(Src) 98 F (36.7 C) (Oral)  Resp 18  Ht 5\' 1"  (1.549 m)  Wt 169 lb (76.658 kg)  BMI 31.93 kg/m2  SpO2 100%  Physical Exam  Nursing note and vitals reviewed. Constitutional: She is oriented to person, place, and time. She appears well-developed and well-nourished.  HENT:  Head: Normocephalic and atraumatic.  Eyes: Conjunctivae are normal.  Neck: Normal range of motion. Neck supple.  Cardiovascular: Normal rate and intact distal pulses.   Pulmonary/Chest: Effort normal.  Musculoskeletal: Normal range of motion. She exhibits tenderness. She exhibits no edema.       Hands:      Wound appears well healing without edema,  Erythema,  Or drainage.  Suture line is intact.  Patient resists active and passive ROM with no obvious source, citing pain.  Distal sensation intact,  Less than 2 sec cap refill.    Neurological: She is alert and oriented to person, place, and time.  Skin: Skin is warm and dry.  Psychiatric: She has a normal mood and affect.     ED Course  Procedures (including critical care time)  Labs Reviewed - No data to display No results found.   1. Wound check, dressing change     Small amount of dressing imbedded in sutures - softened with h2o2,  Then cleansed and dried,  Dressing applied.  Suture line appears healthy and intact.  MDM  Continue abx. Ibuprofen added for pain relief.  Return in 4 days for suture removal.        Candis Musa, PA 09/27/11 1529

## 2011-09-28 DIAGNOSIS — N912 Amenorrhea, unspecified: Secondary | ICD-10-CM | POA: Insufficient documentation

## 2011-09-28 NOTE — Progress Notes (Signed)
  Subjective:    Patient ID: [name], female    DOB: 23-Dec-1980, 31 y.o.   MRN: [id number]  HPI The PT is here for follow annual exam and re-evaluation of chronic medical conditions, medication management and review of any available recent lab and radiology data.  Preventive health is updated, specifically  Cancer screening and Immunization.   Denies any seizures recently. Unfortunately accidentally sustained deep laceration to her finger this past weekend, has sutures which need removal this weekend. Needs to resume depo provera, overdue     Review of Systems See HPI Denies recent fever or chills. Denies sinus pressure, nasal congestion, ear pain or sore throat. Denies chest congestion, productive cough or wheezing. Denies chest pains, palpitations and leg swelling Denies abdominal pain, nausea, vomiting,diarrhea or constipation.   Denies dysuria, frequency, hesitancy or incontinence. Denies joint pain, swelling and limitation in mobility. Denies headaches, seizures, numbness, or tingling. Denies depression, anxiety or insomnia.        Objective:   Physical Exam Pleasant well nourished female, alert and oriented x 3, in no cardio-pulmonary distress. Afebrile. HEENT No facial trauma or asymetry. Sinuses non tender.  EOMI, PERTL, fundoscopic exam is normal, no hemorhage or exudate.  External ears normal, tympanic membranes clear. Oropharynx moist, no exudate, poor dentition. Neck: supple, no adenopathy,JVD or thyromegaly.No bruits.  Chest: Clear to ascultation bilaterally.No crackles or wheezes. Non tender to palpation  Breast: No asymetry,no masses. No nipple discharge or inversion. No axillary or supraclavicular adenopathy  Cardiovascular system; Heart sounds normal,  S1 and  S2 ,no S3.  No murmur, or thrill. Apical beat not displaced Peripheral pulses normal.  Abdomen: Soft, non tender, no organomegaly or masses. No bruits. Bowel sounds normal. No  guarding, tenderness or rebound.   GU: External genitalia normal. No lesions. Vaginal canal normal.fishy discharge. Uterus normal size, no adnexal masses, no cervical motion or adnexal tenderness.  Musculoskeletal exam: Full ROM of spine, hips , shoulders and knees. No deformity ,swelling or crepitus noted. No muscle wasting or atrophy.   Neurologic: Cranial nerves 2 to 12 intact. Power, tone ,sensation and reflexes normal throughout. No disturbance in gait. No tremor.  Skin: Finger laceration, no rash Pigmentation normal throughout  Psych; Normal mood and affect. Judgement and concentration normal        Assessment & Plan:

## 2011-09-28 NOTE — Assessment & Plan Note (Signed)
laceeration of digit with swelling discoloration and tenderness will add antibiotic coverage

## 2011-09-28 NOTE — Assessment & Plan Note (Signed)
Urine pregnancy test negative, will administer depoprovera

## 2011-10-02 ENCOUNTER — Encounter (HOSPITAL_COMMUNITY): Payer: Self-pay | Admitting: *Deleted

## 2011-10-02 ENCOUNTER — Emergency Department (HOSPITAL_COMMUNITY)
Admission: EM | Admit: 2011-10-02 | Discharge: 2011-10-02 | Disposition: A | Discharge status: Home / Self Care | Payer: Medicaid Other | Attending: Emergency Medicine | Admitting: Emergency Medicine

## 2011-10-02 DIAGNOSIS — F17200 Nicotine dependence, unspecified, uncomplicated: Secondary | ICD-10-CM | POA: Insufficient documentation

## 2011-10-02 DIAGNOSIS — Z4802 Encounter for removal of sutures: Secondary | ICD-10-CM | POA: Insufficient documentation

## 2011-10-02 DIAGNOSIS — F329 Major depressive disorder, single episode, unspecified: Secondary | ICD-10-CM | POA: Insufficient documentation

## 2011-10-02 DIAGNOSIS — F3289 Other specified depressive episodes: Secondary | ICD-10-CM | POA: Insufficient documentation

## 2011-10-02 NOTE — Discharge Instructions (Signed)
Suture Removal  Your caregiver has removed your sutures today. If skin adhesive strips were applied at the time of suturing, or applied following removal of the sutures today, they will begin to peel off in a couple more days. If skin adhesive strips remain after 14 days, they may be removed.  HOME CARE INSTRUCTIONS     Change any bandages (dressings) at least once a day or as directed by your caregiver. If the bandage sticks, soak it off with warm, soapy water.    Wash the area with soap and water to remove all the cream or ointment (if you were instructed to use any) 2 times a day. Rinse off the soap and pat the area dry with a clean towel.    Reapply cream or ointment as directed by your caregiver. This will help prevent infection and keep the bandage from sticking.    Keep the wound area dry and clean. If the bandage becomes wet, dirty, or develops a bad smell, change it as soon as possible.    Only take over-the-counter or prescription medicines for pain, discomfort, or fever as directed by your caregiver.    Use sunscreen when out in the sun. New scars become sunburned easily.    Return to your caregivers office in in 7 days or as directed to have your sutures removed.   You may need a tetanus shot if:   You cannot remember when you had your last tetanus shot.    You have never had a tetanus shot.    The injury broke your skin.   If you got a tetanus shot, your arm may swell, get red, and feel warm to the touch. This is common and not a problem. If you need a tetanus shot and you choose not to have one, there is a rare chance of getting tetanus. Sickness from tetanus can be serious.  SEEK IMMEDIATE MEDICAL CARE IF:     There is redness, swelling, or increasing pain in the wound.    Pus is coming from the wound.    An unexplained oral temperature above 102 F (38.9 C) develops.    You notice a bad smell coming from the wound or dressing.     The wound breaks open (edges not staying together) after sutures have been removed.   Document Released: 04/02/2001 Document Revised: 06/27/2011 Document Reviewed: 06/01/2007  ExitCare Patient Information 2012 [employer].

## 2011-10-02 NOTE — ED Notes (Signed)
Here for wound check and suture removal  

## 2011-10-02 NOTE — ED Provider Notes (Signed)
Medical screening examination/treatment/procedure(s) were performed by non-physician practitioner and as supervising physician I was immediately available for consultation/collaboration.   Faylinn Schwenn L Hilja Kintzel, MD 10/02/11 1515 

## 2011-10-02 NOTE — ED Provider Notes (Signed)
History     CSN: [id number]  Arrival date & time 10/02/11  1239   First MD Initiated Contact with Patient 10/02/11 1251      Chief Complaint  Patient presents with  . Wound Check    (Consider location/radiation/quality/duration/timing/severity/associated sxs/prior treatment) Patient is a 31 y.o. female presenting with wound check. The history is provided by the patient.  Wound Check  She was treated in the ED 10 to 14 days ago. Previous treatment in the ED includes laceration repair. Treatments since wound repair include oral antibiotics. There has been no drainage from the wound. There is no redness present. The swelling has not changed. The pain has not changed. She has no difficulty moving the affected extremity or digit.    Past Medical History  Diagnosis Date  . Seizures   . Depression   . Back pain   . Genital warts     herpes  . Herpes     Past Surgical History  Procedure Date  . Cesarean section     x 3    History reviewed. No pertinent family history.  History  Substance Use Topics  . Smoking status: Current Everyday Smoker  . Smokeless tobacco: Not on file  . Alcohol Use: Yes     occasionally    OB History    Grav Para Term Preterm Abortions TAB SAB Ect Mult Living                  Review of Systems  Constitutional: Negative for fever.  HENT: Negative for congestion, sore throat and neck pain.   Eyes: Negative.   Respiratory: Negative.   Cardiovascular: Negative for chest pain.  Gastrointestinal: Negative for nausea and abdominal pain.  Genitourinary: Negative.   Musculoskeletal: Negative for joint swelling and arthralgias.  Skin: Positive for wound. Negative for rash.  Neurological: Negative for dizziness, weakness, light-headedness, numbness and headaches.  Hematological: Negative.   Psychiatric/Behavioral: Negative.     Allergies  Morphine  Home Medications   Current Outpatient Rx  Name Route Sig Dispense Refill  . CEPHALEXIN 500  MG PO CAPS Oral Take 1 capsule (500 mg total) by mouth 2 (two) times daily. 10 capsule 0  . FAMCICLOVIR 250 MG PO TABS  TAKE 1 TABLET BY MOUTH TWICE DAILY 60 tablet 4  . IBUPROFEN 800 MG PO TABS Oral Take 1 tablet (800 mg total) by mouth every 8 (eight) hours as needed for pain. 21 tablet 0  . LAMOTRIGINE ER 250 MG PO TB24 Oral Take 500 mg by mouth at bedtime.    [name] MEDROXYPROGESTERONE ACETATE 150 MG/ML IM SUSP Intramuscular Inject 150 mg into the muscle every 3 (three) months.      BP 116/68  Pulse 80  Temp(Src) 99 F (37.2 C) (Oral)  Resp 20  Ht 5\' 2"  (1.575 m)  Wt 169 lb (76.658 kg)  BMI 30.91 kg/m2  SpO2 100%  Physical Exam  Nursing note and vitals reviewed. Constitutional: She is oriented to person, place, and time. She appears well-developed and well-nourished.  HENT:  Head: Normocephalic and atraumatic.  Eyes: Conjunctivae are normal.  Neck: Normal range of motion.  Cardiovascular: Normal rate and intact distal pulses.   Pulmonary/Chest: Effort normal and breath sounds normal.  Abdominal: She exhibits no distension.  Musculoskeletal: Normal range of motion.  Neurological: She is alert and oriented to person, place, and time.  Skin: Skin is warm and dry. No erythema.       Well-healed suture line at  the volar base of right index finger.  Psychiatric: She has a normal mood and affect.    ED Course  Procedures (including critical care time)  Labs Reviewed - No data to display No results found.   1. Visit for suture removal    SUTURE REMOVAL Performed by: Candis Musa  Consent: Verbal consent obtained. Consent given by: patient Required items: required blood products, implants, devices, and special equipment available Time out: Immediately prior to procedure a "time out" was called to verify the correct patient, procedure, equipment, support staff and site/side marked as required.  Location: right index finger  Wound Appearance: clean  Sutures/Staples Removed:  6  Patient tolerance: Patient tolerated the procedure well with no immediate complications.      MDM  Suture removal         Candis Musa, [place] 10/02/11 [phone]

## 2011-10-04 NOTE — ED Provider Notes (Signed)
Medical screening examination/treatment/procedure(s) were performed by non-physician practitioner and as supervising physician I was immediately available for consultation/collaboration.   [name]. Oletta Lamas, MD 10/04/11 [phone]

## 2011-11-11 ENCOUNTER — Encounter (HOSPITAL_COMMUNITY): Payer: Self-pay | Admitting: *Deleted

## 2011-11-11 ENCOUNTER — Emergency Department (HOSPITAL_COMMUNITY): Payer: No Typology Code available for payment source

## 2011-11-11 ENCOUNTER — Emergency Department (HOSPITAL_COMMUNITY)
Admission: EM | Admit: 2011-11-11 | Discharge: 2011-11-11 | Disposition: A | Discharge status: Home / Self Care | Payer: No Typology Code available for payment source | Attending: Emergency Medicine | Admitting: Emergency Medicine

## 2011-11-11 DIAGNOSIS — R569 Unspecified convulsions: Secondary | ICD-10-CM | POA: Insufficient documentation

## 2011-11-11 DIAGNOSIS — S335XXA Sprain of ligaments of lumbar spine, initial encounter: Secondary | ICD-10-CM | POA: Insufficient documentation

## 2011-11-11 DIAGNOSIS — S39012A Strain of muscle, fascia and tendon of lower back, initial encounter: Secondary | ICD-10-CM

## 2011-11-11 DIAGNOSIS — A6000 Herpesviral infection of urogenital system, unspecified: Secondary | ICD-10-CM | POA: Insufficient documentation

## 2011-11-11 DIAGNOSIS — M545 Low back pain, unspecified: Secondary | ICD-10-CM | POA: Insufficient documentation

## 2011-11-11 MED ORDER — CYCLOBENZAPRINE HCL 5 MG PO TABS: 5.0000 mg | ORAL_TABLET | Freq: Three times a day (TID) | ORAL | Status: AC | PRN

## 2011-11-11 MED ORDER — ACETAMINOPHEN 325 MG PO TABS
650.0000 mg | ORAL_TABLET | Freq: Once | ORAL | Status: AC
  Administered 2011-11-11: 650 mg via ORAL
  Filled 2011-11-11: qty 2
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
## 2011-11-15 ENCOUNTER — Emergency Department (HOSPITAL_COMMUNITY)
Admission: EM | Admit: 2011-11-15 | Discharge: 2011-11-15 | Disposition: A | Discharge status: Home / Self Care | Payer: Medicaid Other | Attending: Emergency Medicine | Admitting: Emergency Medicine

## 2011-11-15 ENCOUNTER — Encounter (HOSPITAL_COMMUNITY): Payer: Self-pay | Admitting: *Deleted

## 2011-11-15 ENCOUNTER — Emergency Department (HOSPITAL_COMMUNITY): Payer: Medicaid Other

## 2011-11-15 DIAGNOSIS — S61209A Unspecified open wound of unspecified finger without damage to nail, initial encounter: Secondary | ICD-10-CM | POA: Insufficient documentation

## 2011-11-15 DIAGNOSIS — S61219A Laceration without foreign body of unspecified finger without damage to nail, initial encounter: Secondary | ICD-10-CM

## 2011-11-15 MED ORDER — BUPIVACAINE HCL (PF) 0.5 % IJ SOLN
10.0000 mL | Freq: Once | INTRAMUSCULAR | Status: DC
  Filled 2011-11-15: qty 30

## 2011-11-15 MED ORDER — OXYCODONE-ACETAMINOPHEN 5-325 MG PO TABS: 1.0000 | ORAL_TABLET | ORAL | Status: AC | PRN

## 2011-11-15 MED ORDER — CEPHALEXIN 500 MG PO CAPS
1000.0000 mg | ORAL_CAPSULE | Freq: Once | ORAL | Status: AC
  Administered 2011-11-15: 1000 mg via ORAL
  Filled 2011-11-15: qty 2

## 2011-11-15 MED ORDER — CEPHALEXIN 500 MG PO CAPS: 500.0000 mg | ORAL_CAPSULE | Freq: Four times a day (QID) | ORAL | Status: AC

## 2011-11-15 NOTE — Discharge Instructions (Signed)
See Dr. Hilda Lias in his office later today - call for an appointment.  Laceration Care, Adult A laceration is a cut or lesion that goes through all layers of the skin and into the tissue just beneath the skin. TREATMENT  Some lacerations may not require closure. Some lacerations may not be able to be closed due to an increased risk of infection. It is important to see your caregiver as soon as possible after an injury to minimize the risk of infection and maximize the opportunity for successful closure. If closure is appropriate, pain medicines may be given, if needed. The wound will be cleaned to help prevent infection. Your caregiver will use stitches (sutures), staples, wound glue (adhesive), or skin adhesive strips to repair the laceration. These tools bring the skin edges together to allow for faster healing and a better cosmetic outcome. However, all wounds will heal with a scar. Once the wound has healed, scarring can be minimized by covering the wound with sunscreen during the day for 1 full year. HOME CARE INSTRUCTIONS  For sutures or staples:  Keep the wound clean and dry.   If you were given a bandage (dressing), you should change it at least once a day. Also, change the dressing if it becomes wet or dirty, or as directed by your caregiver.   Wash the wound with soap and water 2 times a day. Rinse the wound off with water to remove all soap. Pat the wound dry with a clean towel.   After cleaning, apply a thin layer of the antibiotic ointment as recommended by your caregiver. This will help prevent infection and keep the dressing from sticking.   You may shower as usual after the first 24 hours. Do not soak the wound in water until the sutures are removed.   Only take over-the-counter or prescription medicines for pain, discomfort, or fever as directed by your caregiver.   Get your sutures or staples removed as directed by your caregiver.  For skin adhesive strips:  Keep the wound  clean and dry.   Do not get the skin adhesive strips wet. You may bathe carefully, using caution to keep the wound dry.   If the wound gets wet, pat it dry with a clean towel.   Skin adhesive strips will fall off on their own. You may trim the strips as the wound heals. Do not remove skin adhesive strips that are still stuck to the wound. They will fall off in time.  For wound adhesive:  You may briefly wet your wound in the shower or bath. Do not soak or scrub the wound. Do not swim. Avoid periods of heavy perspiration until the skin adhesive has fallen off on its own. After showering or bathing, gently pat the wound dry with a clean towel.   Do not apply liquid medicine, cream medicine, or ointment medicine to your wound while the skin adhesive is in place. This may loosen the film before your wound is healed.   If a dressing is placed over the wound, be careful not to apply tape directly over the skin adhesive. This may cause the adhesive to be pulled off before the wound is healed.   Avoid prolonged exposure to sunlight or tanning lamps while the skin adhesive is in place. Exposure to ultraviolet light in the first year will darken the scar.   The skin adhesive will usually remain in place for 5 to 10 days, then naturally fall off the skin. Do not pick  at the adhesive film.  You may need a tetanus shot if:  You cannot remember when you had your last tetanus shot.   You have never had a tetanus shot.  If you get a tetanus shot, your arm may swell, get red, and feel warm to the touch. This is common and not a problem. If you need a tetanus shot and you choose not to have one, there is a rare chance of getting tetanus. Sickness from tetanus can be serious. SEEK MEDICAL CARE IF:   You have redness, swelling, or increasing pain in the wound.   You see a red line that goes away from the wound.   You have yellowish-white fluid (pus) coming from the wound.   You have a fever.   You  notice a bad smell coming from the wound or dressing.   Your wound breaks open before or after sutures have been removed.   You notice something coming out of the wound such as wood or glass.   Your wound is on your hand or foot and you cannot move a finger or toe.  SEEK IMMEDIATE MEDICAL CARE IF:   Your pain is not controlled with prescribed medicine.   You have severe swelling around the wound causing pain and numbness or a change in color in your arm, hand, leg, or foot.   Your wound splits open and starts bleeding.   You have worsening numbness, weakness, or loss of function of any joint around or beyond the wound.   You develop painful lumps near the wound or on the skin anywhere on your body.  MAKE SURE YOU:   Understand these instructions.   Will watch your condition.   Will get help right away if you are not doing well or get worse.  Document Released: 07/08/2005 Document Revised: 06/27/2011 Document Reviewed: 01/01/2011 [facility] Patient Information 2012 [place], [place].  Tendon Injury Your exam shows that you have an injured tendon. Tendons are cords that connect muscle to bone. They can be cut by sharp objects, or they can rupture with high stress activities. Complete tendon ruptures in the upper or lower extremity may require a surgical repair. However, rupture of the biceps tendon at the shoulder is a common injury that is usually not treated with surgery. Partial tendon lacerations may also be treated without an operation if they are immobilized long enough for the tendon to heal. A splint, bandage, or cast is usually applied to immobilize the area of injury.  Whether surgery is needed or not, most tendon injuries require splinting for 1-2 months before they are completely healed. It is very important that these injuries be protected until then. Incomplete tendon healing or a re-injury can lead to permanent disability or deformity. Do not remove your splint or bandage until  your doctor tells you to. Keep the injured area elevated and take your medicine as prescribed. See your caregiver for follow-up as advised.  SEEK IMMEDIATE MEDICAL CARE IF:  You have increased pain or swelling.   You develop an inability to move the injured area such as a hand, foot, arm, or leg.   There is a pale or bluish color to the injured area.  Document Released: 08/15/2004 Document Revised: 06/27/2011 Document Reviewed: 07/08/2005 [facility] Patient Information 2012 [place], [place].  Cephalexin tablets or capsules What is this medicine? CEPHALEXIN (sef a LEX in) is a cephalosporin antibiotic. It is used to treat certain kinds of bacterial infections It will not work for colds, flu,  or other viral infections. This medicine may be used for other purposes; ask your health care provider or pharmacist if you have questions. What should I tell my health care provider before I take this medicine? They need to know if you have any of these conditions: -kidney disease -stomach or intestine problems, especially colitis -an unusual or allergic reaction to cephalexin, other cephalosporins, penicillins, other antibiotics, medicines, foods, dyes or preservatives -pregnant or trying to get pregnant -breast-feeding How should I use this medicine? Take this medicine by mouth with a full glass of water. Follow the directions on the prescription label. This medicine can be taken with or without food. Take your medicine at regular intervals. Do not take your medicine more often than directed. Take all of your medicine as directed even if you think you are better. Do not skip doses or stop your medicine early. Talk to your pediatrician regarding the use of this medicine in children. While this drug may be prescribed for selected conditions, precautions do apply. Overdosage: If you think you have taken too much of this medicine contact a poison control center or emergency room at once. NOTE: This medicine  is only for you. Do not share this medicine with others. What if I miss a dose? If you miss a dose, take it as soon as you can. If it is almost time for your next dose, take only that dose. Do not take double or extra doses. There should be at least 4 to 6 hours between doses. What may interact with this medicine? -probenecid -some other antibiotics This list may not describe all possible interactions. Give your health care provider a list of all the medicines, herbs, non-prescription drugs, or dietary supplements you use. Also tell them if you smoke, drink alcohol, or use illegal drugs. Some items may interact with your medicine. What should I watch for while using this medicine? Tell your doctor or health care professional if your symptoms do not begin to improve in a few days. Do not treat diarrhea with over the counter products. Contact your doctor if you have diarrhea that lasts more than 2 days or if it is severe and watery. If you have diabetes, you may get a false-positive result for sugar in your urine. Check with your doctor or health care professional. What side effects may I notice from receiving this medicine? Side effects that you should report to your doctor or health care professional as soon as possible: -allergic reactions like skin rash, itching or hives, swelling of the face, lips, or tongue -breathing problems -pain or trouble passing urine -redness, blistering, peeling or loosening of the skin, including inside the mouth -severe or watery diarrhea -unusually weak or tired -yellowing of the eyes, skin Side effects that usually do not require medical attention (report to your doctor or health care professional if they continue or are bothersome): -gas or heartburn -genital or anal irritation -headache -joint or muscle pain -nausea, vomiting This list may not describe all possible side effects. Call your doctor for medical advice about side effects. You may report side  effects to FDA at 1-800-FDA-1088. Where should I keep my medicine? Keep out of the reach of children. Store at room temperature between 59 and 86 degrees F (15 and 30 degrees C). Throw away any unused medicine after the expiration date. NOTE: This sheet is a summary. It may not cover all possible information. If you have questions about this medicine, talk to your doctor, pharmacist, or health care  provider.  2012, [employer] Standard. (10/12/2007 5:09:13 PM)  Acetaminophen; Oxycodone tablets What is this medicine? ACETAMINOPHEN; OXYCODONE (a set a MEE noe fen; ox i KOE done) is a pain reliever. It is used to treat mild to moderate pain. This medicine may be used for other purposes; ask your health care provider or pharmacist if you have questions. What should I tell my health care provider before I take this medicine? They need to know if you have any of these conditions: -brain tumor -Crohn's disease, inflammatory bowel disease, or ulcerative colitis -drink more than 3 alcohol containing drinks per day -drug abuse or addiction -head injury -heart or circulation problems -kidney disease or problems going to the bathroom -liver disease -lung disease, asthma, or breathing problems -an unusual or allergic reaction to acetaminophen, oxycodone, other opioid analgesics, other medicines, foods, dyes, or preservatives -pregnant or trying to get pregnant -breast-feeding How should I use this medicine? Take this medicine by mouth with a full glass of water. Follow the directions on the prescription label. Take your medicine at regular intervals. Do not take your medicine more often than directed. Talk to your pediatrician regarding the use of this medicine in children. Special care may be needed. Patients over 75 years old may have a stronger reaction and need a smaller dose. Overdosage: If you think you have taken too much of this medicine contact a poison control center or emergency room at  once. NOTE: This medicine is only for you. Do not share this medicine with others. What if I miss a dose? If you miss a dose, take it as soon as you can. If it is almost time for your next dose, take only that dose. Do not take double or extra doses. What may interact with this medicine? -alcohol or medicines that contain alcohol -antihistamines -barbiturates like amobarbital, butalbital, butabarbital, methohexital, pentobarbital, phenobarbital, thiopental, and secobarbital -benztropine -drugs for bladder problems like solifenacin, trospium, oxybutynin, tolterodine, hyoscyamine, and methscopolamine -drugs for breathing problems like ipratropium and tiotropium -drugs for certain stomach or intestine problems like propantheline, homatropine methylbromide, glycopyrrolate, atropine, belladonna, and dicyclomine -general anesthetics like etomidate, ketamine, nitrous oxide, propofol, desflurane, enflurane, halothane, isoflurane, and sevoflurane -medicines for depression, anxiety, or psychotic disturbances -medicines for pain like codeine, morphine, pentazocine, buprenorphine, butorphanol, nalbuphine, tramadol, and propoxyphene -medicines for sleep -muscle relaxants -naltrexone -phenothiazines like perphenazine, thioridazine, chlorpromazine, mesoridazine, fluphenazine, prochlorperazine, promazine, and trifluoperazine -scopolamine -trihexyphenidyl This list may not describe all possible interactions. Give your health care provider a list of all the medicines, herbs, non-prescription drugs, or dietary supplements you use. Also tell them if you smoke, drink alcohol, or use illegal drugs. Some items may interact with your medicine. What should I watch for while using this medicine? Tell your doctor or health care professional if your pain does not go away, if it gets worse, or if you have new or a different type of pain. You may develop tolerance to the medicine. Tolerance means that you will need a  higher dose of the medication for pain relief. Tolerance is normal and is expected if you take this medicine for a long time. Do not suddenly stop taking your medicine because you may develop a severe reaction. Your body becomes used to the medicine. This does NOT mean you are addicted. Addiction is a behavior related to getting and using a drug for a nonmedical reason. If you have pain, you have a medical reason to take pain medicine. Your doctor will tell you how much medicine to  take. If your doctor wants you to stop the medicine, the dose will be slowly lowered over time to avoid any side effects. You may get drowsy or dizzy. Do not drive, use machinery, or do anything that needs mental alertness until you know how this medicine affects you. Do not stand or sit up quickly, especially if you are an older patient. This reduces the risk of dizzy or fainting spells. Alcohol may interfere with the effect of this medicine. Avoid alcoholic drinks. The medicine will cause constipation. Try to have a bowel movement at least every 2 to 3 days. If you do not have a bowel movement for 3 days, call your doctor or health care professional. Do not take Tylenol (acetaminophen) or medicines that have acetaminophen with this medicine. Too much acetaminophen can be very dangerous. Many nonprescription medicines contain acetaminophen. Always read the labels carefully to avoid taking more acetaminophen. What side effects may I notice from receiving this medicine? Side effects that you should report to your doctor or health care professional as soon as possible: -allergic reactions like skin rash, itching or hives, swelling of the face, lips, or tongue -breathing difficulties, wheezing -confusion -light headedness or fainting spells -severe stomach pain -yellowing of the skin or the whites of the eyes Side effects that usually do not require medical attention (report to your doctor or health care professional if they  continue or are bothersome): -dizziness -drowsiness -nausea -vomiting This list may not describe all possible side effects. Call your doctor for medical advice about side effects. You may report side effects to FDA at 1-800-FDA-1088. Where should I keep my medicine? Keep out of the reach of children. This medicine can be abused. Keep your medicine in a safe place to protect it from theft. Do not share this medicine with anyone. Selling or giving away this medicine is dangerous and against the law. Store at room temperature between 20 and 25 degrees C (68 and 77 degrees F). Keep container tightly closed. Protect from light. Flush any unused medicines down the toilet. Do not use the medicine after the expiration date. NOTE: This sheet is a summary. It may not cover all possible information. If you have questions about this medicine, talk to your doctor, pharmacist, or health care provider.  2012, [employer]vier/Gold Standard. (06/06/2008 10:01:21 AM)
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
## 2011-11-16 NOTE — ED Provider Notes (Signed)
Medical screening examination/treatment/procedure(s) were performed by non-physician practitioner and as supervising physician I was immediately available for consultation/collaboration.   Shelda Jakes, MD 11/16/11 2111

## 2011-11-18 ENCOUNTER — Encounter: Payer: Self-pay | Admitting: Orthopedic Surgery

## 2011-11-18 ENCOUNTER — Ambulatory Visit (INDEPENDENT_AMBULATORY_CARE_PROVIDER_SITE_OTHER): Payer: Medicaid Other | Admitting: Orthopedic Surgery

## 2011-11-18 VITALS — BP 110/70 | Ht 62.0 in | Wt 170.0 lb

## 2011-11-18 DIAGNOSIS — S61209A Unspecified open wound of unspecified finger without damage to nail, initial encounter: Secondary | ICD-10-CM

## 2011-11-18 DIAGNOSIS — S61219A Laceration without foreign body of unspecified finger without damage to nail, initial encounter: Secondary | ICD-10-CM

## 2011-11-18 NOTE — Patient Instructions (Signed)
Keep finger dry.

## 2011-11-18 NOTE — Progress Notes (Signed)
  Subjective:    [name] is a 31 y.o. female who presents for evaluation of the right index finger status post laceration on April 25  She was evaluated in the emergency room and thought to have a tendon laceration at the DIP joint a traumatic mallet finger. The wound was closed loosely with 3-0 nylon sutures. She was placed in the splint. She comes in with the splint on. There appears to be some maceration of the finger secondary to moisture.  She does not have active extension at the DIP joint  She tells me that she has uncontrolled seizures and has one about every month unknown cause  She does have some throbbing pain which is constant and she rates her pain 9/10 associated with some tingling.  She has a review of systems noted for watering of her eyes muscle pain seizure activity nervousness depression and easy bruising otherwise normal  Physical examination BP 110/70  Ht 5\' 2"  (1.575 m)  Wt 170 lb (77.111 kg)  BMI 31.09 kg/m2 Gen. appearance overall appearance is normal. She is oriented x3 her mood and affect is normal. She is ambulatory without assistive device  Inspection reveals a transverse laceration at the level of the DIP joint of the right index finger. She has flexion but no extension. There is considerable extensor lag. There is no instability of the joint. Strength assessment is noncontributory skin 3 sutures are noted. Capillary refill and temperature of the digit are normal she has normal sensation to the volar aspect. There is no evidence of lymph involvement in the right axilla or epitrochlear region.  Impression extensor tendon laceration with finger laceration approximately 1 cm in length.  The uncontrollable seizures are of some concern. She basically has a traumatic mallet.  There is some consideration to repair although the seizure activity is somewhat concerning. You probably could do this under local anesthetic as well.  However at this point we are  going to try splinting. Sutures out in a week. Continue follow. I would like to see the seizures under control prior to any surgical intervention secondary to the fact that an extensor deficit at this level in a disabled patient although right-hand-dominant is not a functional deficit.

## 2011-11-21 ENCOUNTER — Ambulatory Visit (INDEPENDENT_AMBULATORY_CARE_PROVIDER_SITE_OTHER): Payer: Medicaid Other | Admitting: Orthopedic Surgery

## 2011-11-21 ENCOUNTER — Encounter: Payer: Self-pay | Admitting: Orthopedic Surgery

## 2011-11-21 VITALS — BP 96/52 | Ht 62.0 in | Wt 170.0 lb

## 2011-11-21 DIAGNOSIS — S61219A Laceration without foreign body of unspecified finger without damage to nail, initial encounter: Secondary | ICD-10-CM

## 2011-11-21 DIAGNOSIS — S61209A Unspecified open wound of unspecified finger without damage to nail, initial encounter: Secondary | ICD-10-CM

## 2011-11-21 NOTE — Patient Instructions (Signed)
Continue splinting

## 2011-11-21 NOTE — Progress Notes (Signed)
Patient ID: [name], female   DOB: 11/24/1980, 31 y.o.   MRN: [id number] Chief Complaint  Patient presents with  . Follow-up    recheck right index finger     Traumatic laceration right index finger near the DIP joint with functional mallet finger  Wound check. Wound clean I don't think the sutures are ready to come out. Recommend removing sutures on Monday. Extension/hyperextension splint reapplied

## 2011-11-25 ENCOUNTER — Ambulatory Visit (INDEPENDENT_AMBULATORY_CARE_PROVIDER_SITE_OTHER): Payer: Medicaid Other | Admitting: Orthopedic Surgery

## 2011-11-25 DIAGNOSIS — S61219A Laceration without foreign body of unspecified finger without damage to nail, initial encounter: Secondary | ICD-10-CM

## 2011-11-25 DIAGNOSIS — S61209A Unspecified open wound of unspecified finger without damage to nail, initial encounter: Secondary | ICD-10-CM

## 2011-11-25 NOTE — Patient Instructions (Addendum)
Splint till next visit  Re-tape as needed

## 2011-11-25 NOTE — Progress Notes (Signed)
Patient ID: [name], female   DOB: [birth year], 30 y.o.   MRN: [id number] Chief Complaint  Patient presents with  . Hand Pain    Suture removal status post traumatic mallet index finger    Sutures were removed from the right index finger with no sign of infection she was resplinted and extension. She is now at the 10th day I believe of splinting to need to be splinted for 42 days in extension which will be about June 7

## 2011-11-27 ENCOUNTER — Telehealth: Payer: Self-pay | Admitting: Family Medicine

## 2011-11-27 ENCOUNTER — Other Ambulatory Visit: Payer: Self-pay | Admitting: Family Medicine

## 2011-11-27 MED ORDER — BUTALBITAL-APAP-CAFFEINE 50-325-40 MG PO TABS: 1.0000 | ORAL_TABLET | Freq: Four times a day (QID) | ORAL | Status: AC | PRN

## 2011-11-27 NOTE — Telephone Encounter (Signed)
pls let pt know medication has been sent in

## 2011-11-28 NOTE — Telephone Encounter (Signed)
Patient aware.

## 2011-12-02 ENCOUNTER — Telehealth: Payer: Self-pay | Admitting: Family Medicine

## 2011-12-02 NOTE — Telephone Encounter (Signed)
When did she last get the depo, may be due, let me know, cannot find in the system

## 2011-12-03 NOTE — Telephone Encounter (Signed)
She got her Depo here on 3/5 and was scheduled for next visit on 12 weeks. Not due until end of month. Sunday she didn't bleed at all. Then later on that day she was bleeding heavy. Then stopped and now its like a period again. Should she be concerned and what does she need to do? [phone]

## 2011-12-16 NOTE — Telephone Encounter (Signed)
Pt needs nurse visit for depo this week please

## 2011-12-17 ENCOUNTER — Ambulatory Visit: Payer: Medicaid Other

## 2011-12-17 NOTE — Telephone Encounter (Signed)
Called and left message for pt to call back and get appt for nurse visit this week

## 2011-12-18 NOTE — Telephone Encounter (Signed)
Pt hasn't called back. 

## 2011-12-26 ENCOUNTER — Ambulatory Visit: Payer: Medicaid Other | Admitting: Orthopedic Surgery

## 2012-01-01 ENCOUNTER — Ambulatory Visit: Payer: Medicaid Other | Admitting: Orthopedic Surgery

## 2012-01-01 ENCOUNTER — Encounter: Payer: Self-pay | Admitting: Orthopedic Surgery

## 2012-01-03 ENCOUNTER — Ambulatory Visit: Payer: No Typology Code available for payment source

## 2012-01-09 ENCOUNTER — Ambulatory Visit (INDEPENDENT_AMBULATORY_CARE_PROVIDER_SITE_OTHER): Payer: Medicaid Other

## 2012-01-09 VITALS — BP 140/80 | Wt 166.2 lb

## 2012-01-09 DIAGNOSIS — IMO0001 Reserved for inherently not codable concepts without codable children: Secondary | ICD-10-CM

## 2012-01-09 DIAGNOSIS — Z309 Encounter for contraceptive management, unspecified: Secondary | ICD-10-CM

## 2012-01-10 MED ORDER — MEDROXYPROGESTERONE ACETATE 150 MG/ML IM SUSP
150.0000 mg | Freq: Once | INTRAMUSCULAR | Status: AC
  Administered 2012-01-10: 150 mg via INTRAMUSCULAR

## 2012-01-10 NOTE — Progress Notes (Signed)
Pt received injection with no complications  

## 2012-02-17 ENCOUNTER — Encounter: Payer: Self-pay | Admitting: Family Medicine

## 2012-02-17 ENCOUNTER — Ambulatory Visit (INDEPENDENT_AMBULATORY_CARE_PROVIDER_SITE_OTHER): Payer: Medicaid Other | Admitting: Family Medicine

## 2012-02-17 VITALS — BP 122/82 | HR 75 | Resp 16 | Ht 62.0 in | Wt 165.4 lb

## 2012-02-17 DIAGNOSIS — R569 Unspecified convulsions: Secondary | ICD-10-CM

## 2012-02-17 DIAGNOSIS — F329 Major depressive disorder, single episode, unspecified: Secondary | ICD-10-CM

## 2012-02-17 DIAGNOSIS — T148XXA Other injury of unspecified body region, initial encounter: Secondary | ICD-10-CM | POA: Insufficient documentation

## 2012-02-17 DIAGNOSIS — F17200 Nicotine dependence, unspecified, uncomplicated: Secondary | ICD-10-CM

## 2012-02-17 MED ORDER — FLUOXETINE HCL 10 MG PO CAPS: 10.0000 mg | ORAL_CAPSULE | Freq: Every day | ORAL | Status: DC

## 2012-02-17 NOTE — Progress Notes (Signed)
  Subjective:    Patient ID: [name], female    DOB: 08/30/1980, 31 y.o.   MRN: [id number]  HPI Spontaneous bruises noted on body for 1 week, no recent trauma, no recent seizures. Has not been able to see neurologist needs referral, had a seizure last month 2 month h/o social withdrawal and isolation, not suicidal or homicidal , no hallucinations  Review of Systems See HPI Denies recent fever or chills. Denies sinus pressure, nasal congestion, ear pain or sore throat. Denies chest congestion, productive cough or wheezing. Denies chest pains, palpitations and leg swelling Denies abdominal pain, nausea, vomiting,diarrhea or constipation.   Denies dysuria, frequency, hesitancy or incontinence. Denies joint pain, swelling and limitation in mobility. Denies headaches,numbness, or tingling. . Denies skin break down or rash.        Objective:   Physical Exam  Patient alert and oriented and in no cardiopulmonary distress.  HEENT: No facial asymmetry, EOMI, no sinus tenderness,  oropharynx pink and moist.  Neck supple no adenopathy.  Chest: Clear to auscultation bilaterally.Decreased air entry throughout  CVS: S1, S2 no murmurs, no S3.  ABD: Soft non tender. Bowel sounds normal.  Ext: No edema  MS: Adequate ROM spine, shoulders, hips and knees.  Skin: Intact, bruises noted on thighs.  Psych: Good eye contact, normal affect. Memory intact not anxious or depressed appearing.  CNS: CN 2-12 intact, power, tone and sensation normal throughout.       Assessment & Plan:

## 2012-02-17 NOTE — Patient Instructions (Addendum)
F/u in 8 weeks  CBC, PT/PTT today to evaluate bruising, and you are also referred to the hematologist.  New medication prescribed for depression  You need to stop smoking to reduce your risk of cancer, stroke and heart disease, and prevent lung failure.   Please think about quitting smoking.  This is very important for your health.  Consider setting a quit date, then cutting back or switching brands to prepare to stop.  Also think of the money you will save every day by not smoking.  Quick Tips to Quit Smoking: Fix a date i.e. keep a date in mind from when you would not touch a tobacco product to smoke  Keep yourself busy and block your mind with work loads or reading books or watching movies in malls where smoking is not allowed  Vanish off the things which reminds you about smoking for example match box, or your favorite lighter, or the pipe you used for smoking, or your favorite jeans and shirt with which you used to enjoy smoking, or the club where you used to do smoking  Try to avoid certain people places and incidences where and with whom smoking is a common factor to add on  Praise yourself with some token gifts from the money you saved by stopping smoking  Anti Smoking teams are there to help you. Join their programs  Anti-smoking Gums are there in many medical shops. Try them to quit smoking   Side-effects of Smoking: Disease caused by smoking cigarettes are emphysema, bronchitis, heart failures  Premature death  Cancer is the major side effect of smoking  Heart attacks and strokes are the quick effects of smoking causing sudden death  Some smokers lives end up with limbs amputated  Breathing problem or fast breathing is another side effect of smoking  Due to more intakes of smokes, carbon mono-oxide goes into your brain and other muscles of the body which leads to swelling of the veins and blockage to the air passage to lungs  Carbon monoxide blocks blood vessels which leads to  blockage in the flow of blood to different major body organs like heart lungs and thus leads to attacks and deaths  During pregnancy smoking is very harmful and leads to premature birth of the infant, spontaneous abortions, low weight of the infant during birth  Fat depositions to narrow and blocked blood vessels causing heart attacks  In many cases cigarette smoking caused infertility in men

## 2012-02-18 LAB — CBC WITH DIFFERENTIAL/PLATELET
Eosinophils Absolute: 0.1 10*3/uL (ref 0.0–0.7)
Eosinophils Relative: 1 % (ref 0–5)
HCT: 44.6 % (ref 36.0–46.0)
Hemoglobin: 14.5 g/dL (ref 12.0–15.0)
Lymphs Abs: 3.2 10*3/uL (ref 0.7–4.0)
MCH: 31.9 pg (ref 26.0–34.0)
MCV: 98.2 fL (ref 78.0–100.0)
Monocytes Relative: 9 % (ref 3–12)
RBC: 4.54 MIL/uL (ref 3.87–5.11)

## 2012-02-18 LAB — PROTIME-INR: INR: 1.1 (ref <1.50)

## 2012-02-29 NOTE — Assessment & Plan Note (Signed)
Pt concerned about new bruising, basic lab data unrevealing, may be medication rel;ated, however will refer for hematology eval

## 2012-02-29 NOTE — Assessment & Plan Note (Signed)
Uncontrolled, and followed by neurology

## 2012-02-29 NOTE — Assessment & Plan Note (Signed)
Uncontrolled and worsening start prozac. Not suicidal or homicidal

## 2012-02-29 NOTE — Assessment & Plan Note (Signed)
Worsend, reports smoking more. Patient counseled for approximately 5 minutes regarding the health risks of ongoing nicotine use, specifically all types of cancer, heart disease, stroke and respiratory failure. The options available for help with cessation ,the behavioral changes to assist the process, and the option to either gradully reduce usage  Or abruptly stop.is also discussed. Pt is also encouraged to set specific goals in number of cigarettes used daily, as well as to set a quit date.

## 2012-03-05 ENCOUNTER — Telehealth: Payer: Self-pay | Admitting: Family Medicine

## 2012-03-05 ENCOUNTER — Other Ambulatory Visit: Payer: Self-pay

## 2012-03-05 MED ORDER — FAMCICLOVIR 250 MG PO TABS: 250.0000 mg | ORAL_TABLET | Freq: Two times a day (BID) | ORAL | Status: DC

## 2012-03-05 NOTE — Telephone Encounter (Signed)
Ok to refill famvir x 1  Adviose her to contact her neurologist Dr Gerilyn Pilgrim re headache management please

## 2012-03-09 NOTE — Telephone Encounter (Signed)
Pt aware.

## 2012-03-09 NOTE — Telephone Encounter (Signed)
Med refilled.  Called and left message for pt to return call.

## 2012-03-10 ENCOUNTER — Ambulatory Visit: Payer: Medicaid Other | Admitting: Family Medicine

## 2012-03-10 ENCOUNTER — Telehealth: Payer: Self-pay | Admitting: Family Medicine

## 2012-03-11 NOTE — Telephone Encounter (Signed)
Patient is aware 

## 2012-03-20 ENCOUNTER — Ambulatory Visit (HOSPITAL_COMMUNITY): Payer: Medicaid Other | Admitting: Oncology

## 2012-03-20 ENCOUNTER — Encounter (HOSPITAL_COMMUNITY): Payer: Self-pay | Admitting: Oncology

## 2012-03-26 ENCOUNTER — Ambulatory Visit: Payer: Medicaid Other | Admitting: Family Medicine

## 2012-03-26 ENCOUNTER — Ambulatory Visit: Payer: Medicaid Other

## 2012-04-22 ENCOUNTER — Ambulatory Visit (INDEPENDENT_AMBULATORY_CARE_PROVIDER_SITE_OTHER): Payer: Medicaid Other

## 2012-04-22 VITALS — BP 130/80 | Wt 161.0 lb

## 2012-04-22 DIAGNOSIS — Z309 Encounter for contraceptive management, unspecified: Secondary | ICD-10-CM

## 2012-04-22 DIAGNOSIS — IMO0001 Reserved for inherently not codable concepts without codable children: Secondary | ICD-10-CM

## 2012-04-22 DIAGNOSIS — Z23 Encounter for immunization: Secondary | ICD-10-CM

## 2012-04-22 LAB — POCT URINE PREGNANCY: Preg Test, Ur: NEGATIVE

## 2012-04-22 MED ORDER — MEDROXYPROGESTERONE ACETATE 150 MG/ML IM SUSP
150.0000 mg | Freq: Once | INTRAMUSCULAR | Status: AC
  Administered 2012-04-22: 150 mg via INTRAMUSCULAR

## 2012-06-11 ENCOUNTER — Telehealth: Payer: Self-pay | Admitting: Family Medicine

## 2012-06-11 MED ORDER — FAMCICLOVIR 250 MG PO TABS: 250.0000 mg | ORAL_TABLET | Freq: Two times a day (BID) | ORAL | Status: DC

## 2012-06-11 NOTE — Telephone Encounter (Signed)
Advise tylenol 325 mg up,to 3 times per day for body aches

## 2012-06-11 NOTE — Telephone Encounter (Signed)
Refill sent in for famvir. Please advise on the other question

## 2012-06-11 NOTE — Telephone Encounter (Signed)
Patient aware.

## 2012-07-20 ENCOUNTER — Ambulatory Visit: Payer: Medicaid Other

## 2012-07-27 ENCOUNTER — Ambulatory Visit: Payer: Medicaid Other

## 2012-07-27 ENCOUNTER — Ambulatory Visit (INDEPENDENT_AMBULATORY_CARE_PROVIDER_SITE_OTHER): Payer: Medicare Other | Admitting: Family Medicine

## 2012-07-27 ENCOUNTER — Telehealth: Payer: Self-pay | Admitting: Family Medicine

## 2012-07-27 ENCOUNTER — Encounter: Payer: Self-pay | Admitting: Family Medicine

## 2012-07-27 VITALS — BP 122/70 | HR 80 | Resp 18 | Ht 62.0 in | Wt 164.0 lb

## 2012-07-27 DIAGNOSIS — F329 Major depressive disorder, single episode, unspecified: Secondary | ICD-10-CM | POA: Diagnosis not present

## 2012-07-27 DIAGNOSIS — R569 Unspecified convulsions: Secondary | ICD-10-CM

## 2012-07-27 DIAGNOSIS — R5381 Other malaise: Secondary | ICD-10-CM | POA: Diagnosis not present

## 2012-07-27 DIAGNOSIS — R5383 Other fatigue: Secondary | ICD-10-CM | POA: Diagnosis not present

## 2012-07-27 DIAGNOSIS — F17200 Nicotine dependence, unspecified, uncomplicated: Secondary | ICD-10-CM

## 2012-07-27 DIAGNOSIS — E559 Vitamin D deficiency, unspecified: Secondary | ICD-10-CM

## 2012-07-27 MED ORDER — ERGOCALCIFEROL 1.25 MG (50000 UT) PO CAPS: 50000.0000 [IU] | ORAL_CAPSULE | ORAL | Status: DC

## 2012-07-27 MED ORDER — FLUOXETINE HCL 10 MG PO CAPS: 10.0000 mg | ORAL_CAPSULE | Freq: Every day | ORAL | Status: DC

## 2012-07-27 NOTE — Patient Instructions (Addendum)
CPE mid March  You need to quit smoking, cut back by 1 cigarette each week, start at 19 this week  You will get quit line info  We will contact you with info as to whether the patch is covered. If it is then no cigarettes and patch only  Resume fluoxetine, this will likely help the symptom, also weekly vit D  CXR today  TSH today and cbc

## 2012-07-27 NOTE — Telephone Encounter (Signed)
Med was sent electronically.

## 2012-07-27 NOTE — Progress Notes (Signed)
  Subjective:    Patient ID: [name], female    DOB: 03-14-1981, 32 y.o.   MRN: [id number]  HPI Pt in with main c/o hot flashes and increased symptoms of stress and anxiety. She is concerned as to whether she may be premenopausal.Also c/o fatigue Denies head  Congestion ear pain or sore throat, denies urinary frequency or dysuria, denies flank pain C/o incraesed aough and shortness of breath, states she is ready to quit and needs help   Review of Systems See HPI   Denies recent fever or chills. Denies sinus pressure, nasal congestion, ear pain or sore throat. Denies chest congestion, productive cough or wheezing. Denies chest pains, palpitations and leg swelling Denies abdominal pain, nausea, vomiting,diarrhea or constipation.   Denies dysuria, frequency, hesitancy or incontinence. Denies joint pain, swelling and limitation in mobility. Denies skin break down or rash. C/o increased stress , anxiety and depression. Not suicidal or homicidal, no hallucinations Intermittent despite being on medication     Objective:   Physical Exam  Patient alert and oriented and in no cardiopulmonary distress.  HEENT: No facial asymmetry, EOMI, no sinus tenderness,  oropharynx pink and moist.  Neck supple no adenopathy.  Chest: Clear to auscultation bilaterally.  CVS: S1, S2 no murmurs, no S3.  ABD: Soft non tender. Bowel sounds normal.  Ext: No edema  MS: Adequate ROM spine, shoulders, hips and knees.  Skin: Intact, no ulcerations or rash noted.  Psych: Good eye contact, normal affect. Memory intact not anxious or depressed appearing.  CNS: CN 2-12 intact, power, tone and sensation normal throughout.       Assessment & Plan:

## 2012-07-28 LAB — CBC WITH DIFFERENTIAL/PLATELET
Basophils Relative: 0 % (ref 0–1)
Hemoglobin: 14.2 g/dL (ref 12.0–15.0)
Lymphs Abs: 3.2 10*3/uL (ref 0.7–4.0)
MCHC: 33.2 g/dL (ref 30.0–36.0)
Monocytes Relative: 10 % (ref 3–12)
Neutro Abs: 4.9 10*3/uL (ref 1.7–7.7)
Neutrophils Relative %: 54 % (ref 43–77)
Platelets: 339 10*3/uL (ref 150–400)
RBC: 4.39 MIL/uL (ref 3.87–5.11)

## 2012-07-28 LAB — TSH: TSH: 0.92 u[IU]/mL (ref 0.350–4.500)

## 2012-08-07 ENCOUNTER — Emergency Department (HOSPITAL_COMMUNITY)
Admission: EM | Admit: 2012-08-07 | Discharge: 2012-08-07 | Disposition: A | Discharge status: Home / Self Care | Payer: Medicaid Other | Attending: Emergency Medicine | Admitting: Emergency Medicine

## 2012-08-07 ENCOUNTER — Encounter (HOSPITAL_COMMUNITY): Payer: Self-pay | Admitting: *Deleted

## 2012-08-07 ENCOUNTER — Other Ambulatory Visit: Payer: Self-pay

## 2012-08-07 DIAGNOSIS — W503XXA Accidental bite by another person, initial encounter: Secondary | ICD-10-CM | POA: Insufficient documentation

## 2012-08-07 DIAGNOSIS — R569 Unspecified convulsions: Secondary | ICD-10-CM

## 2012-08-07 DIAGNOSIS — Y929 Unspecified place or not applicable: Secondary | ICD-10-CM | POA: Insufficient documentation

## 2012-08-07 DIAGNOSIS — Z79899 Other long term (current) drug therapy: Secondary | ICD-10-CM | POA: Insufficient documentation

## 2012-08-07 DIAGNOSIS — F329 Major depressive disorder, single episode, unspecified: Secondary | ICD-10-CM | POA: Insufficient documentation

## 2012-08-07 DIAGNOSIS — Y939 Activity, unspecified: Secondary | ICD-10-CM | POA: Insufficient documentation

## 2012-08-07 DIAGNOSIS — G40909 Epilepsy, unspecified, not intractable, without status epilepticus: Secondary | ICD-10-CM | POA: Insufficient documentation

## 2012-08-07 DIAGNOSIS — S01512A Laceration without foreign body of oral cavity, initial encounter: Secondary | ICD-10-CM

## 2012-08-07 DIAGNOSIS — S01502A Unspecified open wound of oral cavity, initial encounter: Secondary | ICD-10-CM | POA: Insufficient documentation

## 2012-08-07 DIAGNOSIS — F17200 Nicotine dependence, unspecified, uncomplicated: Secondary | ICD-10-CM | POA: Insufficient documentation

## 2012-08-07 DIAGNOSIS — F3289 Other specified depressive episodes: Secondary | ICD-10-CM | POA: Insufficient documentation

## 2012-08-07 DIAGNOSIS — Z8619 Personal history of other infectious and parasitic diseases: Secondary | ICD-10-CM | POA: Insufficient documentation

## 2012-08-07 LAB — POCT I-STAT, CHEM 8
BUN: 10 mg/dL (ref 6–23)
Creatinine, Ser: 1 mg/dL (ref 0.50–1.10)
Sodium: 138 mEq/L (ref 135–145)
TCO2: 22 mmol/L (ref 0–100)

## 2012-08-07 MED ORDER — OXYCODONE-ACETAMINOPHEN 5-325 MG PO TABS
2.0000 | ORAL_TABLET | Freq: Once | ORAL | Status: AC
  Administered 2012-08-07: 2 via ORAL
  Filled 2012-08-07: qty 2

## 2012-08-07 MED ORDER — NAPROXEN 500 MG PO TABS: 500.0000 mg | ORAL_TABLET | Freq: Two times a day (BID) | ORAL | Status: DC

## 2012-08-07 MED ORDER — LEVETIRACETAM 500 MG/5ML IV SOLN
INTRAVENOUS | Status: AC
  Filled 2012-08-07: qty 5

## 2012-08-07 MED ORDER — OXYCODONE-ACETAMINOPHEN 5-325 MG PO TABS: 1.0000 | ORAL_TABLET | ORAL | Status: DC | PRN

## 2012-08-07 MED ORDER — SODIUM CHLORIDE 0.9 % IV SOLN
500.0000 mg | Freq: Once | INTRAVENOUS | Status: AC
  Administered 2012-08-07: 500 mg via INTRAVENOUS
  Filled 2012-08-07: qty 5

## 2012-08-07 MED ORDER — LAMOTRIGINE 200 MG PO TABS
200.0000 mg | ORAL_TABLET | Freq: Once | ORAL | Status: AC
  Administered 2012-08-07: 200 mg via ORAL
  Filled 2012-08-07: qty 1

## 2012-08-07 MED ORDER — LAMOTRIGINE 200 MG PO TABS: 500.0000 mg | ORAL_TABLET | ORAL | Status: DC

## 2012-08-07 MED ORDER — LEVETIRACETAM 500 MG PO TABS: 500.0000 mg | ORAL_TABLET | Freq: Two times a day (BID) | ORAL | Status: DC

## 2012-08-07 NOTE — ED Notes (Signed)
Called the [facility] for the medications as they are not in the ED pyxis.
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
## 2012-08-07 NOTE — ED Provider Notes (Addendum)
History     CSN: [id number]  Arrival date & time 08/07/12  4098   First MD Initiated Contact with Patient 08/07/12 0444      Chief Complaint  Patient presents with  . Seizures    (Consider location/radiation/quality/duration/timing/severity/associated sxs/prior treatment) HPI Comments: 32 year old female with a history of seizure disorder which was initially diagnosed in 2010 presents with recurrent seizure.  The patient states that she has been feeling fine, she has had no complaints and has not taken her medications in the last 2 days. This includes Lamictal. She states that she was surprised to find paramedics at her home, she was unaware that she had a seizure, her son found her having an active seizure, blood on the pillow case, called 911. On paramedic arrival the patient was no longer seizing, was mildly postictal. Her symptoms are intermittent, worse with not taking her medications, associated with tongue biting but no urinary incontinence. This seizure occurred just prior to arrival. She states that she has 2-3 seizures a month despite taking her medications as prescribed.  Review of the medical record shows that the patient has had both CT scan and MRI of the brain after having seizures neither of which showed acute findings. She has also established care with local neurology at Dr. Ronal Fear office, he has increased her dosage of the medical in the past but she does not recall ever taking another medication for seizures  Patient is a 32 y.o. female presenting with seizures. The history is provided by the patient, the EMS personnel and medical records.  Seizures     Past Medical History  Diagnosis Date  . Seizures   . Depression   . Back pain   . Genital warts     herpes  . Herpes     Past Surgical History  Procedure Date  . Cesarean section     x 3    No family history on file.  History  Substance Use Topics  . Smoking status: Current Every Day Smoker  .  Smokeless tobacco: Not on file  . Alcohol Use: Yes     Comment: occasionally    OB History    Grav Para Term Preterm Abortions TAB SAB Ect Mult Living                  Review of Systems  Neurological: Positive for seizures.  All other systems reviewed and are negative.    Allergies  Morphine  Home Medications   Current Outpatient Rx  Name  Route  Sig  Dispense  Refill  . BUTALBITAL-APAP-CAFFEINE 50-325-40 MG PO TABS   Oral   Take 1-2 tablets by mouth every 6 (six) hours as needed for headache.   20 tablet   1   . FAMCICLOVIR 250 MG PO TABS   Oral   Take 1 tablet (250 mg total) by mouth 2 (two) times daily.   60 tablet   1   . FLUOXETINE HCL 10 MG PO CAPS   Oral   Take 1 capsule (10 mg total) by mouth daily.   30 capsule   2   . LAMOTRIGINE ER 250 MG PO TB24   Oral   Take 500 mg by mouth at bedtime.         [name] MEDROXYPROGESTERONE ACETATE 150 MG/ML IM SUSP   Intramuscular   Inject 150 mg into the muscle every 3 (three) months.         . ERGOCALCIFEROL 50000 UNITS PO CAPS  Oral   Take 1 capsule (50,000 Units total) by mouth once a week. One capsule once weekly   12 capsule   1   . LEVETIRACETAM 500 MG PO TABS   Oral   Take 1 tablet (500 mg total) by mouth every 12 (twelve) hours.   60 tablet   1   . NAPROXEN 500 MG PO TABS   Oral   Take 1 tablet (500 mg total) by mouth 2 (two) times daily with a meal.   30 tablet   0   . OXYCODONE-ACETAMINOPHEN 5-325 MG PO TABS   Oral   Take 1 tablet by mouth every 4 (four) hours as needed for pain.   10 tablet   0     BP 117/78  Pulse 89  Temp 99 F (37.2 C) (Oral)  Resp 18  Ht 5\' 2"  (1.575 m)  Wt 160 lb (72.576 kg)  BMI 29.26 kg/m2  SpO2 100%  Physical Exam  Nursing note and vitals reviewed. Constitutional: She appears well-developed and well-nourished. No distress.  HENT:  Head: Normocephalic and atraumatic.  Mouth/Throat: Oropharynx is clear and moist. No oropharyngeal exudate.        Lacerations of the tongue or on the right side andare superficial, no gaping holes  Eyes: Conjunctivae normal and EOM are normal. Pupils are equal, round, and reactive to light. Right eye exhibits no discharge. Left eye exhibits no discharge. No scleral icterus.  Neck: Normal range of motion. Neck supple. No JVD present. No thyromegaly present.  Cardiovascular: Normal rate, regular rhythm, normal heart sounds and intact distal pulses.  Exam reveals no gallop and no friction rub.   No murmur heard. Pulmonary/Chest: Effort normal and breath sounds normal. No respiratory distress. She has no wheezes. She has no rales.  Abdominal: Soft. Bowel sounds are normal. She exhibits no distension and no mass. There is no tenderness.  Musculoskeletal: Normal range of motion. She exhibits no edema and no tenderness.  Lymphadenopathy:    She has no cervical adenopathy.  Neurological: She is alert. Coordination normal.       Memory intact over the last several days though she does not remember the specific seizure episode. Normal level of alertness, normal orientation, moving all extremities, clear speech, follows commands without difficulty.  Skin: Skin is warm and dry. No rash noted. No erythema.  Psychiatric: She has a normal mood and affect. Her behavior is normal.    ED Course  Procedures (including critical care time)   Labs Reviewed  POCT I-STAT, CHEM 8   No results found.   1. Seizure   2. Laceration of tongue       MDM  At this time the vital signs have normalized, there is no tachycardia hypoxia tachypnea or hypotension. She is not febrile and has an exam that is not concerning for any specific injuries other than tongue biting. The lacerations of the tongue are very small and do not need repair. She will need to be loaded with her Lamictal and I will start Keppra as the patient has been continuing to have seizures despite taking her Lamictal sometimes as many as 2-3 times per month per the  patient report. She will need to followup closely with the neurologist, she has agreed to this plan.  The patient has had no further seizures while in the ED - has been loaded with lamictal and Keppra IV, recommended to pt to follow up with Neuro - let them know new meds.  Pain  medicine given for tongue laceration which does not require sutures.  Pt is at baseline MS and feels well at this time - asking to be d/c.    i-stat chem is normal.  ECG ordered by nursing:  ED ECG REPORT  I personally interpreted this EKG   Date: 08/07/2012   Rate: 95  Rhythm: normal sinus rhythm  QRS Axis: normal  Intervals: normal  ST/T Wave abnormalities: nonspecific T wave changes  Conduction Disutrbances:none  Narrative Interpretation:   Old EKG Reviewed: c/w 09/03/11, no significant changes, T wave abnormalities persist      Vida Roller, MD 08/07/12 1610  Vida Roller, MD 08/07/12 [phone]

## 2012-08-11 DIAGNOSIS — G40919 Epilepsy, unspecified, intractable, without status epilepticus: Secondary | ICD-10-CM | POA: Diagnosis not present

## 2012-08-16 NOTE — Assessment & Plan Note (Signed)
Pt needs weekly vit D till corrected, she is aware

## 2012-08-16 NOTE — Assessment & Plan Note (Signed)
Deteriorated with increased anxiety, resume fluoxetine

## 2012-08-16 NOTE — Assessment & Plan Note (Signed)
Uncontrolled, and followed by neurology, h/o reptd breakthrough seizures

## 2012-08-16 NOTE — Assessment & Plan Note (Signed)
Unchanged. Patient counseled for approximately 5 minutes regarding the health risks of ongoing nicotine use, specifically all types of cancer, heart disease, stroke and respiratory failure. The options available for help with cessation ,the behavioral changes to assist the process, and the option to either gradully reduce usage  Or abruptly stop.is also discussed. Pt is also encouraged to set specific goals in number of cigarettes used daily, as well as to set a quit date. Pt wants to quit and wants help with this

## 2012-08-28 ENCOUNTER — Other Ambulatory Visit: Payer: Self-pay

## 2012-08-28 ENCOUNTER — Telehealth: Payer: Self-pay | Admitting: Family Medicine

## 2012-08-28 MED ORDER — FAMCICLOVIR 250 MG PO TABS: 250.0000 mg | ORAL_TABLET | Freq: Two times a day (BID) | ORAL | Status: DC

## 2012-08-28 NOTE — Telephone Encounter (Signed)
Med refilled.

## 2012-09-11 DIAGNOSIS — F17200 Nicotine dependence, unspecified, uncomplicated: Secondary | ICD-10-CM | POA: Diagnosis not present

## 2012-09-11 DIAGNOSIS — G43919 Migraine, unspecified, intractable, without status migrainosus: Secondary | ICD-10-CM | POA: Diagnosis not present

## 2012-09-28 ENCOUNTER — Encounter: Payer: Medicaid Other | Admitting: Family Medicine

## 2012-10-03 DIAGNOSIS — R569 Unspecified convulsions: Secondary | ICD-10-CM | POA: Diagnosis not present

## 2012-10-07 ENCOUNTER — Encounter: Payer: Medicaid Other | Admitting: Family Medicine

## 2012-10-08 DIAGNOSIS — G43919 Migraine, unspecified, intractable, without status migrainosus: Secondary | ICD-10-CM | POA: Diagnosis not present

## 2012-10-08 DIAGNOSIS — G40919 Epilepsy, unspecified, intractable, without status epilepticus: Secondary | ICD-10-CM | POA: Diagnosis not present

## 2012-10-08 DIAGNOSIS — F17200 Nicotine dependence, unspecified, uncomplicated: Secondary | ICD-10-CM | POA: Diagnosis not present

## 2012-10-13 ENCOUNTER — Telehealth: Payer: Self-pay | Admitting: Family Medicine

## 2012-10-13 NOTE — Telephone Encounter (Signed)
Noted  

## 2012-10-29 ENCOUNTER — Ambulatory Visit: Payer: Medicaid Other

## 2012-10-30 ENCOUNTER — Ambulatory Visit: Payer: Medicaid Other

## 2012-11-09 ENCOUNTER — Telehealth: Payer: Self-pay | Admitting: Family Medicine

## 2012-11-09 MED ORDER — FAMCICLOVIR 250 MG PO TABS: 250.0000 mg | ORAL_TABLET | Freq: Two times a day (BID) | ORAL | Status: DC

## 2012-11-09 NOTE — Telephone Encounter (Signed)
Sent in

## 2012-11-17 DIAGNOSIS — Z79899 Other long term (current) drug therapy: Secondary | ICD-10-CM | POA: Diagnosis not present

## 2012-11-17 DIAGNOSIS — G40919 Epilepsy, unspecified, intractable, without status epilepticus: Secondary | ICD-10-CM | POA: Diagnosis not present

## 2012-11-17 DIAGNOSIS — G43919 Migraine, unspecified, intractable, without status migrainosus: Secondary | ICD-10-CM | POA: Diagnosis not present

## 2012-11-17 DIAGNOSIS — F17200 Nicotine dependence, unspecified, uncomplicated: Secondary | ICD-10-CM | POA: Diagnosis not present

## 2012-12-12 DIAGNOSIS — R5383 Other fatigue: Secondary | ICD-10-CM | POA: Diagnosis not present

## 2012-12-12 DIAGNOSIS — R569 Unspecified convulsions: Secondary | ICD-10-CM | POA: Diagnosis not present

## 2012-12-16 DIAGNOSIS — F17200 Nicotine dependence, unspecified, uncomplicated: Secondary | ICD-10-CM | POA: Diagnosis not present

## 2012-12-16 DIAGNOSIS — G43919 Migraine, unspecified, intractable, without status migrainosus: Secondary | ICD-10-CM | POA: Diagnosis not present

## 2012-12-16 DIAGNOSIS — G40919 Epilepsy, unspecified, intractable, without status epilepticus: Secondary | ICD-10-CM | POA: Diagnosis not present

## 2012-12-16 DIAGNOSIS — Z79899 Other long term (current) drug therapy: Secondary | ICD-10-CM | POA: Diagnosis not present

## 2012-12-20 ENCOUNTER — Encounter (HOSPITAL_COMMUNITY): Payer: Self-pay

## 2012-12-20 ENCOUNTER — Emergency Department (HOSPITAL_COMMUNITY)
Admission: EM | Admit: 2012-12-20 | Discharge: 2012-12-21 | Disposition: A | Discharge status: Home / Self Care | Payer: Medicare Other | Attending: Emergency Medicine | Admitting: Emergency Medicine

## 2012-12-20 DIAGNOSIS — F329 Major depressive disorder, single episode, unspecified: Secondary | ICD-10-CM | POA: Diagnosis not present

## 2012-12-20 DIAGNOSIS — Z79899 Other long term (current) drug therapy: Secondary | ICD-10-CM | POA: Insufficient documentation

## 2012-12-20 DIAGNOSIS — Z8619 Personal history of other infectious and parasitic diseases: Secondary | ICD-10-CM | POA: Insufficient documentation

## 2012-12-20 DIAGNOSIS — Z8739 Personal history of other diseases of the musculoskeletal system and connective tissue: Secondary | ICD-10-CM | POA: Diagnosis not present

## 2012-12-20 DIAGNOSIS — Y9289 Other specified places as the place of occurrence of the external cause: Secondary | ICD-10-CM | POA: Insufficient documentation

## 2012-12-20 DIAGNOSIS — Y9389 Activity, other specified: Secondary | ICD-10-CM | POA: Insufficient documentation

## 2012-12-20 DIAGNOSIS — F3289 Other specified depressive episodes: Secondary | ICD-10-CM | POA: Insufficient documentation

## 2012-12-20 DIAGNOSIS — J029 Acute pharyngitis, unspecified: Secondary | ICD-10-CM | POA: Insufficient documentation

## 2012-12-20 DIAGNOSIS — G40909 Epilepsy, unspecified, not intractable, without status epilepticus: Secondary | ICD-10-CM | POA: Diagnosis not present

## 2012-12-20 DIAGNOSIS — IMO0002 Reserved for concepts with insufficient information to code with codable children: Secondary | ICD-10-CM | POA: Insufficient documentation

## 2012-12-20 DIAGNOSIS — F17200 Nicotine dependence, unspecified, uncomplicated: Secondary | ICD-10-CM | POA: Insufficient documentation

## 2012-12-20 NOTE — ED Notes (Signed)
Pt states "it still feels like I have pills stuck in my throat", unable to visualize pills, throat tissue red, pt able to swallow and breathe without difficulty

## 2012-12-20 NOTE — ED Notes (Signed)
Pt took her two lamictal pills approx 45 minutes ago and felt that they got stuck, has vomited x 2 but did not see tablets.  Pt states it still feels like something is in throat, no resp diff.

## 2012-12-21 NOTE — ED Notes (Signed)
Pt alert & oriented x4, stable gait. Patient given discharge instructions, paperwork & prescription(s). Patient  instructed to stop at the registration desk to finish any additional paperwork. Patient verbalized understanding. Pt left department w/ no further questions. 

## 2012-12-21 NOTE — ED Notes (Signed)
Pt tolerated drinking a can of Ginger Ale, states that it made a little bit of difference but not much.

## 2012-12-21 NOTE — ED Provider Notes (Signed)
History     CSN: [id number]  Arrival date & time 12/20/12  2311   First MD Initiated Contact with Patient 12/20/12 2358      Chief Complaint  Patient presents with  . Pill stuck in throat     (Consider location/radiation/quality/duration/timing/severity/associated sxs/prior treatment) HPI HPI Comments: [name] is a 32 y.o. female who presents to the Emergency Department complaining of a sore throat after swallowing 2 lamictal tablets around 10 PM. It does not feel like they went down. She has had water and soda with no effect. Able to swallow.   PCp Dr. Lodema Hong  Past Medical History  Diagnosis Date  . Seizures   . Depression   . Back pain   . Genital warts     herpes  . Herpes     Past Surgical History  Procedure Laterality Date  . Cesarean section      x 3    No family history on file.  History  Substance Use Topics  . Smoking status: Current Every Day Smoker  . Smokeless tobacco: Not on file  . Alcohol Use: Yes     Comment: occasionally    OB History   Grav Para Term Preterm Abortions TAB SAB Ect Mult Living                  Review of Systems  Constitutional: Negative for fever.       10 Systems reviewed and are negative for acute change except as noted in the HPI.  HENT: Negative for congestion.        Sore throat  Eyes: Negative for discharge and redness.  Respiratory: Negative for cough and shortness of breath.   Cardiovascular: Negative for chest pain.  Gastrointestinal: Negative for vomiting and abdominal pain.  Musculoskeletal: Negative for back pain.  Skin: Negative for rash.  Neurological: Negative for syncope, numbness and headaches.  Psychiatric/Behavioral:       No behavior change.    Allergies  Morphine  Home Medications   Current Outpatient Rx  Name  Route  Sig  Dispense  Refill  . FLUoxetine (PROZAC) 10 MG capsule   Oral   Take 1 capsule (10 mg total) by mouth daily.   30 capsule   2   . LamoTRIgine (LAMICTAL XR)  250 MG TB24   Oral   Take 500 mg by mouth at bedtime.         . levETIRAcetam (KEPPRA) 500 MG tablet   Oral   Take 1 tablet (500 mg total) by mouth every 12 (twelve) hours.   60 tablet   1   . medroxyPROGESTERone (DEPO-PROVERA) 150 MG/ML injection   Intramuscular   Inject 150 mg into the muscle every 3 (three) months.         . naproxen (NAPROSYN) 500 MG tablet   Oral   Take 1 tablet (500 mg total) by mouth 2 (two) times daily with a meal.   30 tablet   0   . oxyCODONE-acetaminophen (PERCOCET) 5-325 MG per tablet   Oral   Take 1 tablet by mouth every 4 (four) hours as needed for pain.   10 tablet   0   . ergocalciferol (VITAMIN D2) 50000 UNITS capsule   Oral   Take 1 capsule (50,000 Units total) by mouth once a week. One capsule once weekly   12 capsule   1   . famciclovir (FAMVIR) 250 MG tablet   Oral   Take 1 tablet (250  mg total) by mouth 2 (two) times daily.   60 tablet   1     BP 147/94  Pulse 86  Temp(Src) 98.4 F (36.9 C) (Oral)  Resp 18  Ht 5\' 2"  (1.575 m)  Wt 147 lb (66.679 kg)  BMI 26.88 kg/m2  SpO2 100%  Physical Exam  Nursing note and vitals reviewed. Constitutional: She appears well-developed and well-nourished.  Awake, alert, nontoxic appearance.  HENT:  Head: Normocephalic and atraumatic.  Right Ear: External ear normal.  Left Ear: External ear normal.  Mouth/Throat: Oropharynx is clear and moist.  Eyes: EOM are normal. Pupils are equal, round, and reactive to light. Right eye exhibits no discharge. Left eye exhibits no discharge.  Neck: Normal range of motion. Neck supple.  Cardiovascular: Normal rate and intact distal pulses.   Pulmonary/Chest: Effort normal and breath sounds normal. She exhibits no tenderness.  Abdominal: Soft. Bowel sounds are normal. There is no tenderness. There is no rebound.  Musculoskeletal: Normal range of motion. She exhibits no tenderness.  Baseline ROM, no obvious new focal weakness.  Neurological:   Mental status and motor strength appears baseline for patient and situation.  Skin: No rash noted.  Psychiatric: She has a normal mood and affect.    ED Course  Procedures (including critical care time)   MDM  Patient with sore throat who had swallowed 2 lamictal pills at 10 PM. Has had several drinks of soda since arrival with no real change in the feeling of soreness to her throat. She is able to swallow. Will refer her to GI in the morning. Pt stable in ED with no significant deterioration in condition.The patient appears reasonably screened and/or stabilized for discharge and I doubt any other medical condition or other [facility] requiring further screening, evaluation, or treatment in the ED at this time prior to discharge.  MDM Reviewed: nursing note and vitals           Nicoletta Dress. Colon Branch, MD 12/21/12 1610

## 2012-12-25 ENCOUNTER — Encounter (HOSPITAL_COMMUNITY): Payer: Self-pay

## 2012-12-25 ENCOUNTER — Emergency Department (HOSPITAL_COMMUNITY): Payer: No Typology Code available for payment source

## 2012-12-25 ENCOUNTER — Emergency Department (HOSPITAL_COMMUNITY)
Admission: EM | Admit: 2012-12-25 | Discharge: 2012-12-25 | Disposition: A | Discharge status: Home / Self Care | Payer: No Typology Code available for payment source | Attending: Emergency Medicine | Admitting: Emergency Medicine

## 2012-12-25 DIAGNOSIS — S335XXA Sprain of ligaments of lumbar spine, initial encounter: Secondary | ICD-10-CM | POA: Insufficient documentation

## 2012-12-25 DIAGNOSIS — F329 Major depressive disorder, single episode, unspecified: Secondary | ICD-10-CM | POA: Insufficient documentation

## 2012-12-25 DIAGNOSIS — IMO0002 Reserved for concepts with insufficient information to code with codable children: Secondary | ICD-10-CM | POA: Diagnosis not present

## 2012-12-25 DIAGNOSIS — Y92410 Unspecified street and highway as the place of occurrence of the external cause: Secondary | ICD-10-CM | POA: Insufficient documentation

## 2012-12-25 DIAGNOSIS — S139XXA Sprain of joints and ligaments of unspecified parts of neck, initial encounter: Secondary | ICD-10-CM | POA: Diagnosis not present

## 2012-12-25 DIAGNOSIS — G40909 Epilepsy, unspecified, not intractable, without status epilepticus: Secondary | ICD-10-CM | POA: Insufficient documentation

## 2012-12-25 DIAGNOSIS — F17200 Nicotine dependence, unspecified, uncomplicated: Secondary | ICD-10-CM | POA: Insufficient documentation

## 2012-12-25 DIAGNOSIS — Z79899 Other long term (current) drug therapy: Secondary | ICD-10-CM | POA: Insufficient documentation

## 2012-12-25 DIAGNOSIS — Y9389 Activity, other specified: Secondary | ICD-10-CM | POA: Insufficient documentation

## 2012-12-25 DIAGNOSIS — M542 Cervicalgia: Secondary | ICD-10-CM | POA: Diagnosis not present

## 2012-12-25 DIAGNOSIS — S161XXA Strain of muscle, fascia and tendon at neck level, initial encounter: Secondary | ICD-10-CM

## 2012-12-25 DIAGNOSIS — S0993XA Unspecified injury of face, initial encounter: Secondary | ICD-10-CM | POA: Diagnosis not present

## 2012-12-25 DIAGNOSIS — Z8619 Personal history of other infectious and parasitic diseases: Secondary | ICD-10-CM | POA: Insufficient documentation

## 2012-12-25 DIAGNOSIS — F3289 Other specified depressive episodes: Secondary | ICD-10-CM | POA: Insufficient documentation

## 2012-12-25 DIAGNOSIS — Z3202 Encounter for pregnancy test, result negative: Secondary | ICD-10-CM | POA: Insufficient documentation

## 2012-12-25 DIAGNOSIS — M545 Low back pain: Secondary | ICD-10-CM | POA: Diagnosis not present

## 2012-12-25 DIAGNOSIS — S39012A Strain of muscle, fascia and tendon of lower back, initial encounter: Secondary | ICD-10-CM

## 2012-12-25 DIAGNOSIS — S199XXA Unspecified injury of neck, initial encounter: Secondary | ICD-10-CM | POA: Diagnosis not present

## 2012-12-25 MED ORDER — NAPROXEN 500 MG PO TABS: 500.0000 mg | ORAL_TABLET | Freq: Two times a day (BID) | ORAL | Status: DC

## 2012-12-25 MED ORDER — CYCLOBENZAPRINE HCL 10 MG PO TABS: 10.0000 mg | ORAL_TABLET | Freq: Three times a day (TID) | ORAL | Status: DC | PRN
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
## 2012-12-27 NOTE — ED Provider Notes (Signed)
Medical screening examination/treatment/procedure(s) were performed by non-physician practitioner and as supervising physician I was immediately available for consultation/collaboration.  Yul Diana M Una Yeomans, MD 12/27/12 1657 

## 2013-01-11 DIAGNOSIS — R569 Unspecified convulsions: Secondary | ICD-10-CM | POA: Diagnosis not present

## 2013-01-14 DIAGNOSIS — G40919 Epilepsy, unspecified, intractable, without status epilepticus: Secondary | ICD-10-CM | POA: Diagnosis not present

## 2013-01-14 DIAGNOSIS — G43919 Migraine, unspecified, intractable, without status migrainosus: Secondary | ICD-10-CM | POA: Diagnosis not present

## 2013-01-14 DIAGNOSIS — Z79899 Other long term (current) drug therapy: Secondary | ICD-10-CM | POA: Diagnosis not present

## 2013-01-14 DIAGNOSIS — F17200 Nicotine dependence, unspecified, uncomplicated: Secondary | ICD-10-CM | POA: Diagnosis not present

## 2013-01-20 DIAGNOSIS — R569 Unspecified convulsions: Secondary | ICD-10-CM | POA: Diagnosis not present

## 2013-01-25 ENCOUNTER — Other Ambulatory Visit: Payer: Self-pay | Admitting: Family Medicine

## 2013-01-25 ENCOUNTER — Telehealth: Payer: Self-pay

## 2013-01-25 DIAGNOSIS — Z309 Encounter for contraceptive management, unspecified: Secondary | ICD-10-CM

## 2013-01-25 NOTE — Telephone Encounter (Signed)
Referral is enteered, psl send in for her first injection to pharmacy per protocol and set her up for the appt

## 2013-01-26 MED ORDER — MEDROXYPROGESTERONE ACETATE 150 MG/ML IM SUSP: 150.0000 mg | Freq: Once | INTRAMUSCULAR | Status: DC

## 2013-01-26 NOTE — Telephone Encounter (Signed)
Injection sent in for patient to take with her to first appt.

## 2013-01-26 NOTE — Addendum Note (Signed)
Addended by: Kandis Fantasia B on: 01/26/2013 12:33 PM   Modules accepted: Orders

## 2013-01-29 ENCOUNTER — Other Ambulatory Visit: Payer: Self-pay

## 2013-01-29 ENCOUNTER — Telehealth: Payer: Self-pay | Admitting: Family Medicine

## 2013-01-29 MED ORDER — FAMCICLOVIR 250 MG PO TABS: 250.0000 mg | ORAL_TABLET | Freq: Two times a day (BID) | ORAL | Status: DC

## 2013-02-01 NOTE — Telephone Encounter (Signed)
error 

## 2013-02-02 ENCOUNTER — Other Ambulatory Visit: Payer: Self-pay

## 2013-02-02 ENCOUNTER — Ambulatory Visit: Payer: Self-pay | Admitting: Women's Health

## 2013-02-03 ENCOUNTER — Encounter: Payer: Self-pay | Admitting: Adult Health

## 2013-02-03 ENCOUNTER — Ambulatory Visit (INDEPENDENT_AMBULATORY_CARE_PROVIDER_SITE_OTHER): Payer: Medicare Other | Admitting: Adult Health

## 2013-02-03 VITALS — BP 108/78 | Ht 62.0 in | Wt 165.0 lb

## 2013-02-03 DIAGNOSIS — Z309 Encounter for contraceptive management, unspecified: Secondary | ICD-10-CM

## 2013-02-03 DIAGNOSIS — Z3202 Encounter for pregnancy test, result negative: Secondary | ICD-10-CM | POA: Diagnosis not present

## 2013-02-03 DIAGNOSIS — Z3049 Encounter for surveillance of other contraceptives: Secondary | ICD-10-CM

## 2013-02-03 LAB — POCT URINE PREGNANCY: Preg Test, Ur: NEGATIVE

## 2013-02-03 MED ORDER — MEDROXYPROGESTERONE ACETATE 150 MG/ML IM SUSP
150.0000 mg | Freq: Once | INTRAMUSCULAR | Status: AC
  Administered 2013-02-03: 150 mg via INTRAMUSCULAR

## 2013-02-03 NOTE — Progress Notes (Signed)
Subjective:     Patient ID: [name], female   DOB: November 03, 1980, 32 y.o.   MRN: [id number]  HPI [name] is a 32 year old black female who is her for Depo, she was getting at Dr Anthony Sar office.No complaints.Last pap in 2013.  Review of Systems See HPI Reviewed past medical,surgical, social and family history. Reviewed medications and allergies.     Objective:   Physical Exam BP 108/78  Ht 5\' 2"  (1.575 m)  Wt 165 lb (74.844 kg)  BMI 30.17 kg/m2 Urine pregnancy test negative.Pt received Depo 150 mg Im left deltoid by me.    Assessment:     Contraceptive management    Plan:     Return in 12 weeks for next Depo

## 2013-02-03 NOTE — Patient Instructions (Addendum)
Follow up in 12 weeks for depo

## 2013-02-09 DIAGNOSIS — G43919 Migraine, unspecified, intractable, without status migrainosus: Secondary | ICD-10-CM | POA: Diagnosis not present

## 2013-02-09 DIAGNOSIS — Z79899 Other long term (current) drug therapy: Secondary | ICD-10-CM | POA: Diagnosis not present

## 2013-02-09 DIAGNOSIS — G40919 Epilepsy, unspecified, intractable, without status epilepticus: Secondary | ICD-10-CM | POA: Diagnosis not present

## 2013-03-07 ENCOUNTER — Encounter (HOSPITAL_COMMUNITY): Payer: Self-pay | Admitting: Emergency Medicine

## 2013-03-07 ENCOUNTER — Emergency Department (HOSPITAL_COMMUNITY)
Admission: EM | Admit: 2013-03-07 | Discharge: 2013-03-07 | Disposition: A | Discharge status: Home / Self Care | Payer: Medicare Other | Attending: Emergency Medicine | Admitting: Emergency Medicine

## 2013-03-07 ENCOUNTER — Emergency Department (HOSPITAL_COMMUNITY): Payer: Medicare Other

## 2013-03-07 DIAGNOSIS — S0990XA Unspecified injury of head, initial encounter: Secondary | ICD-10-CM | POA: Diagnosis not present

## 2013-03-07 DIAGNOSIS — S1093XA Contusion of unspecified part of neck, initial encounter: Secondary | ICD-10-CM | POA: Diagnosis not present

## 2013-03-07 DIAGNOSIS — Z79899 Other long term (current) drug therapy: Secondary | ICD-10-CM | POA: Diagnosis not present

## 2013-03-07 DIAGNOSIS — S0083XA Contusion of other part of head, initial encounter: Secondary | ICD-10-CM | POA: Diagnosis not present

## 2013-03-07 DIAGNOSIS — Z8619 Personal history of other infectious and parasitic diseases: Secondary | ICD-10-CM | POA: Diagnosis not present

## 2013-03-07 DIAGNOSIS — F17200 Nicotine dependence, unspecified, uncomplicated: Secondary | ICD-10-CM | POA: Insufficient documentation

## 2013-03-07 DIAGNOSIS — R569 Unspecified convulsions: Secondary | ICD-10-CM

## 2013-03-07 DIAGNOSIS — S0510XA Contusion of eyeball and orbital tissues, unspecified eye, initial encounter: Secondary | ICD-10-CM | POA: Insufficient documentation

## 2013-03-07 DIAGNOSIS — G40909 Epilepsy, unspecified, not intractable, without status epilepticus: Secondary | ICD-10-CM | POA: Insufficient documentation

## 2013-03-07 DIAGNOSIS — F329 Major depressive disorder, single episode, unspecified: Secondary | ICD-10-CM | POA: Diagnosis not present

## 2013-03-07 DIAGNOSIS — F3289 Other specified depressive episodes: Secondary | ICD-10-CM | POA: Insufficient documentation

## 2013-03-07 DIAGNOSIS — S0512XA Contusion of eyeball and orbital tissues, left eye, initial encounter: Secondary | ICD-10-CM

## 2013-03-07 MED ORDER — LORAZEPAM 1 MG PO TABS
1.0000 mg | ORAL_TABLET | Freq: Once | ORAL | Status: AC
  Administered 2013-03-07: 1 mg via ORAL
  Filled 2013-03-07: qty 1

## 2013-03-07 MED ORDER — HYDROCODONE-ACETAMINOPHEN 5-325 MG PO TABS
2.0000 | ORAL_TABLET | Freq: Once | ORAL | Status: AC
  Administered 2013-03-07: 2 via ORAL
  Filled 2013-03-07: qty 2

## 2013-03-07 MED ORDER — OXYCODONE-ACETAMINOPHEN 5-325 MG PO TABS: ORAL_TABLET | ORAL | Status: DC
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
## 2013-03-07 NOTE — Discharge Instructions (Signed)
 Seizure, Adult A seizure is abnormal electrical activity in the brain. Seizures can cause a change in attention or behavior (altered mental status). Seizures often involve uncontrollable shaking (convulsions). Seizures usually last from 30 seconds to 2 minutes. Epilepsy is a brain disorder in which a patient has repeated seizures over time. CAUSES  There are many different problems that can cause seizures. In some cases, no cause is found. Common causes of seizures include:  Head injuries.  Brain tumors.  Infections.  Imbalance of chemicals in the blood.  Kidney failure or liver failure.  Heart disease.  Drug abuse.  Stroke.  Withdrawal from certain drugs or alcohol.  Birth defects.  Malfunction of a neurosurgical device placed in the brain. SYMPTOMS  Symptoms vary depending on the part of the brain that is involved. Right before a seizure, you may have a warning (aura) that a seizure is about to occur. An aura may include the following symptoms:   Fear or anxiety.  Nausea.  Feeling like the room is spinning (vertigo).  Vision changes, such as seeing flashing lights or spots. Common symptoms during a seizure include:  Convulsions.  Drooling.  Rapid eye movements.  Grunting.  Loss of bladder and bowel control.  Bitter taste in the mouth. After a seizure, you may feel confused and sleepy. You may also have an injury resulting from convulsions during the seizure. DIAGNOSIS  Your caregiver will perform a physical exam and run some tests to determine the type and cause of your seizure. These tests may include:  Blood tests.  A lumbar puncture test. In this test, a small amount of fluid is removed from the spine and examined.  Electrocardiography (ECG). This test records the electrical activity in your heart.  Imaging tests, such as computed tomography (CT) scans or magnetic resonance imaging (MRI).  Electroencephalography (EEG). This test records the  electrical activity in your brain. TREATMENT  Seizures usually stop on their own. Treatment will depend on the cause of your seizure. In some cases, medicine may be given to prevent future seizures. HOME CARE INSTRUCTIONS   If you are given medicines, take them exactly as prescribed by your caregiver.  Keep all follow-up appointments as directed by your caregiver.  Do not swim or drive until your caregiver says it is okay.  Teach friends and family what to do if you have a seizure. They should:  Lay you on the ground to prevent a fall.  Put a cushion under your head.  Loosen any tight clothing around your neck.  Turn you on your side. If vomiting occurs, this helps keep your airway clear.  Stay with you until you recover. SEEK IMMEDIATE MEDICAL CARE IF:  The seizure lasts longer than 2 to 5 minutes.  The seizure is severe or the person does not wake up after the seizure.  The person has altered mental status. Drive the person to the emergency department or call your local emergency services (911 in [place] MAKE SURE YOU:  Understand these instructions.  Will watch your condition.  Will get help right away if you are not doing well or get worse. Document Released: 07/05/2000 Document Revised: 09/30/2011 Document Reviewed: 06/26/2011 [facility] Patient Information 2014 [place], [place].    Narcotic and benzodiazepine use may cause drowsiness, slowed breathing or dependence.  Please use with caution and do not drive, operate machinery or watch young children alone while taking them.  Taking combinations of these medications or drinking alcohol will potentiate these effects.
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
## 2013-03-07 NOTE — ED Notes (Signed)
Pt's son at bedside. Son states "seizure was about 30-45 seconds." Son denies mother [pt] "hitting anything during seizure." Pt was asleep in bed during witnessed seizure.

## 2013-03-09 LAB — LAMOTRIGINE LEVEL: Lamotrigine Lvl: 3.7 ug/mL (ref 3.0–14.0)

## 2013-03-15 DIAGNOSIS — G40919 Epilepsy, unspecified, intractable, without status epilepticus: Secondary | ICD-10-CM | POA: Diagnosis not present

## 2013-03-15 DIAGNOSIS — Z79899 Other long term (current) drug therapy: Secondary | ICD-10-CM | POA: Diagnosis not present

## 2013-03-15 DIAGNOSIS — G43919 Migraine, unspecified, intractable, without status migrainosus: Secondary | ICD-10-CM | POA: Diagnosis not present

## 2013-04-14 DIAGNOSIS — G40919 Epilepsy, unspecified, intractable, without status epilepticus: Secondary | ICD-10-CM | POA: Diagnosis not present

## 2013-04-14 DIAGNOSIS — R51 Headache: Secondary | ICD-10-CM | POA: Diagnosis not present

## 2013-04-14 DIAGNOSIS — Z79899 Other long term (current) drug therapy: Secondary | ICD-10-CM | POA: Diagnosis not present

## 2013-04-15 DIAGNOSIS — G43919 Migraine, unspecified, intractable, without status migrainosus: Secondary | ICD-10-CM | POA: Diagnosis not present

## 2013-04-15 DIAGNOSIS — Z79899 Other long term (current) drug therapy: Secondary | ICD-10-CM | POA: Diagnosis not present

## 2013-04-15 DIAGNOSIS — G40919 Epilepsy, unspecified, intractable, without status epilepticus: Secondary | ICD-10-CM | POA: Diagnosis not present

## 2013-04-22 ENCOUNTER — Other Ambulatory Visit (HOSPITAL_COMMUNITY)
Admission: RE | Admit: 2013-04-22 | Discharge: 2013-04-22 | Disposition: A | Discharge status: Home / Self Care | Payer: Medicare Other | Source: Ambulatory Visit | Attending: Family Medicine | Admitting: Family Medicine

## 2013-04-22 ENCOUNTER — Ambulatory Visit (INDEPENDENT_AMBULATORY_CARE_PROVIDER_SITE_OTHER): Payer: Medicare Other | Admitting: Family Medicine

## 2013-04-22 ENCOUNTER — Encounter: Payer: Self-pay | Admitting: Family Medicine

## 2013-04-22 VITALS — BP 118/76 | HR 76 | Resp 16 | Wt 162.4 lb

## 2013-04-22 DIAGNOSIS — Z124 Encounter for screening for malignant neoplasm of cervix: Secondary | ICD-10-CM

## 2013-04-22 DIAGNOSIS — Z1239 Encounter for other screening for malignant neoplasm of breast: Secondary | ICD-10-CM | POA: Diagnosis not present

## 2013-04-22 DIAGNOSIS — Z1151 Encounter for screening for human papillomavirus (HPV): Secondary | ICD-10-CM | POA: Insufficient documentation

## 2013-04-22 DIAGNOSIS — Z113 Encounter for screening for infections with a predominantly sexual mode of transmission: Secondary | ICD-10-CM | POA: Insufficient documentation

## 2013-04-22 DIAGNOSIS — E559 Vitamin D deficiency, unspecified: Secondary | ICD-10-CM | POA: Diagnosis not present

## 2013-04-22 DIAGNOSIS — Z23 Encounter for immunization: Secondary | ICD-10-CM | POA: Diagnosis not present

## 2013-04-22 DIAGNOSIS — N760 Acute vaginitis: Secondary | ICD-10-CM | POA: Insufficient documentation

## 2013-04-22 DIAGNOSIS — F329 Major depressive disorder, single episode, unspecified: Secondary | ICD-10-CM

## 2013-04-22 DIAGNOSIS — Z13220 Encounter for screening for lipoid disorders: Secondary | ICD-10-CM | POA: Diagnosis not present

## 2013-04-22 DIAGNOSIS — F3289 Other specified depressive episodes: Secondary | ICD-10-CM

## 2013-04-22 DIAGNOSIS — F17200 Nicotine dependence, unspecified, uncomplicated: Secondary | ICD-10-CM

## 2013-04-22 DIAGNOSIS — Z0000 Encounter for general adult medical examination without abnormal findings: Secondary | ICD-10-CM

## 2013-04-22 DIAGNOSIS — E663 Overweight: Secondary | ICD-10-CM

## 2013-04-22 DIAGNOSIS — R569 Unspecified convulsions: Secondary | ICD-10-CM

## 2013-04-22 LAB — BASIC METABOLIC PANEL
BUN: 3 mg/dL — ABNORMAL LOW (ref 6–23)
CO2: 27 mEq/L (ref 19–32)
Chloride: 106 mEq/L (ref 96–112)
Creat: 0.88 mg/dL (ref 0.50–1.10)

## 2013-04-22 LAB — LIPID PANEL
HDL: 44 mg/dL (ref >39)
LDL Cholesterol: 83 mg/dL (ref 0–99)
Triglycerides: 83 mg/dL (ref <150)

## 2013-04-22 MED ORDER — VENLAFAXINE HCL ER 37.5 MG PO CP24: 37.5000 mg | ORAL_CAPSULE | Freq: Every day | ORAL | Status: DC

## 2013-04-22 MED ORDER — FAMCICLOVIR 250 MG PO TABS: 250.0000 mg | ORAL_TABLET | Freq: Two times a day (BID) | ORAL | Status: DC

## 2013-04-22 MED ORDER — FLUOXETINE HCL 10 MG PO CAPS: 10.0000 mg | ORAL_CAPSULE | Freq: Every day | ORAL | Status: DC

## 2013-04-22 NOTE — Assessment & Plan Note (Signed)
Deteriorated. Patient re-educated about  the importance of commitment to a  minimum of 150 minutes of exercise per week. The importance of healthy food choices with portion control discussed. Encouraged to start a food diary, count calories and to consider  joining a support group. Sample diet sheets offered. Goals set by the patient for the next several months.    

## 2013-04-22 NOTE — Progress Notes (Signed)
  Subjective:    Patient ID: [name], female    DOB: 07/14/1981, 32 y.o.   MRN: [id number]  HPI Pt in for pelvic and breast exam. She has had problems with recurrent seizures in recent times, for which neurology has been treating her, Currently smoking 1 PPD and wants to quit but states she uses this for her nerves. Has been out of depression medication, for a while, has not called or come in for 9 month. Denies suicidal or homicidal thought , but feels overwhelmed and stressed often. She also c/o excessive sweating espescially at night. C/o spitting up blood when she brushes her teeth sometimes, but also has seizures where she bites her tongue   Review of Systems See HPI Denies recent fever or chills. Denies sinus pressure, nasal congestion, ear pain or sore throat. Denies chest congestion, productive cough or wheezing. Denies chest pains, palpitations and leg swelling Denies abdominal pain, nausea, vomiting,diarrhea or constipation.   Denies dysuria, frequency, hesitancy or incontinence.c/o vaginal d/c and stinging from HSV 2 wants to resume dauily acyclovir Denies joint pain, swelling and limitation in mobility.  For headaches states she is being prescribed hydrocodone by neurology, take topamax intermittently and still suffers from headachee Denies skin break down or rash.        Objective:   Physical Exam   Pleasant well nourished female, alert and oriented x 3, in no cardio-pulmonary distress. Afebrile. HEENT No facial trauma or asymetry. Sinuses non tender.  EOMI, PERTL,  External ears normal, tympanic membranes clear. Oropharynx moist, no exudate, fair  dentition. Neck: supple, no adenopathy,JVD or thyromegaly.No bruits.  Chest: Clear to ascultation bilaterally.No crackles or wheezes. Non tender to palpation  Breast: No asymetry,no masses. No nipple discharge or inversion. No axillary or supraclavicular adenopathy  Cardiovascular system; Heart sounds  normal,  S1 and  S2 ,no S3.  No murmur, or thrill. Apical beat not displaced Peripheral pulses normal.  Abdomen: Soft, non tender, no organomegaly or masses. No bruits. Bowel sounds normal. No guarding, tenderness or rebound.   GU: External genitalia normal. No lesions. Vaginal canal normal.White fishy   discharge. Uterus normal size, no adnexal masses, no cervical motion or adnexal tenderness.  Musculoskeletal exam: Full ROM of spine, hips , shoulders and knees. No deformity ,swelling or crepitus noted. No muscle wasting or atrophy.   Neurologic: Cranial nerves 2 to 12 intact. Power, tone ,sensation and reflexes normal throughout. No disturbance in gait. No tremor.  Skin: Intact, no ulceration, erythema , scaling or rash noted. Pigmentation normal throughout  Psych; Depressed mood and affect. Judgement and concentration normal      Assessment & Plan:

## 2013-04-22 NOTE — Assessment & Plan Note (Signed)
Unchanged. Patient counseled for approximately 5 minutes regarding the health risks of ongoing nicotine use, specifically all types of cancer, heart disease, stroke and respiratory failure. The options available for help with cessation ,the behavioral changes to assist the process, and the option to either gradully reduce usage  Or abruptly stop.is also discussed. Pt is also encouraged to set specific goals in number of cigarettes used daily, as well as to set a quit date.  

## 2013-04-22 NOTE — Assessment & Plan Note (Addendum)
Pt to start treatment with effexor , this should help with night sweats also, she is not suicidal or homicidal

## 2013-04-22 NOTE — Patient Instructions (Addendum)
F/u in early January, call if you need me before  Flu vaccine today  Effexor  sent in for depression  Acyclovir is renewed  It is important that you exercise regularly at least 30 minutes 5 times a week. If you develop chest pain, have severe difficulty breathing, or feel very tired, stop exercising immediately and seek medical attention   Please cut back on smoking, you need to quit, we will also provide you wtih resources to help you to quit. It is good that you want to, since you are aware that smoking increases your risk of all types of cancer  Labs today, lipid, chem 7, vit D

## 2013-04-22 NOTE — Assessment & Plan Note (Signed)
General exam as documented Pt counselled re need to stop smoking, and community  resources provided to help with this Flu vaccine administered

## 2013-04-23 DIAGNOSIS — N760 Acute vaginitis: Secondary | ICD-10-CM | POA: Insufficient documentation

## 2013-04-24 NOTE — Assessment & Plan Note (Signed)
Specimens sent for testing treate based on result

## 2013-05-03 ENCOUNTER — Other Ambulatory Visit: Payer: Self-pay

## 2013-05-03 DIAGNOSIS — N760 Acute vaginitis: Secondary | ICD-10-CM

## 2013-05-03 DIAGNOSIS — E559 Vitamin D deficiency, unspecified: Secondary | ICD-10-CM

## 2013-05-03 MED ORDER — FLUCONAZOLE 150 MG PO TABS: ORAL_TABLET | ORAL | Status: DC

## 2013-05-03 MED ORDER — METRONIDAZOLE 500 MG PO TABS: 500.0000 mg | ORAL_TABLET | Freq: Two times a day (BID) | ORAL | Status: DC

## 2013-05-03 MED ORDER — VITAMIN D3 1.25 MG (50000 UT) PO CAPS: 50000.0000 [IU] | ORAL_CAPSULE | ORAL | Status: DC

## 2013-05-05 ENCOUNTER — Ambulatory Visit: Payer: Medicare Other

## 2013-05-07 ENCOUNTER — Ambulatory Visit: Payer: Medicare Other

## 2013-05-10 ENCOUNTER — Other Ambulatory Visit: Payer: Medicare Other

## 2013-05-10 ENCOUNTER — Ambulatory Visit: Payer: Medicare Other

## 2013-05-12 ENCOUNTER — Ambulatory Visit: Payer: Medicare Other

## 2013-05-12 ENCOUNTER — Other Ambulatory Visit: Payer: Medicare Other

## 2013-05-13 ENCOUNTER — Other Ambulatory Visit: Payer: Medicare Other

## 2013-05-13 ENCOUNTER — Ambulatory Visit: Payer: Medicare Other

## 2013-05-13 ENCOUNTER — Other Ambulatory Visit: Payer: Self-pay | Admitting: Adult Health

## 2013-05-13 DIAGNOSIS — G40919 Epilepsy, unspecified, intractable, without status epilepticus: Secondary | ICD-10-CM | POA: Diagnosis not present

## 2013-05-13 DIAGNOSIS — R51 Headache: Secondary | ICD-10-CM | POA: Diagnosis not present

## 2013-05-13 DIAGNOSIS — Z3202 Encounter for pregnancy test, result negative: Secondary | ICD-10-CM

## 2013-05-13 DIAGNOSIS — Z3200 Encounter for pregnancy test, result unknown: Secondary | ICD-10-CM | POA: Diagnosis not present

## 2013-05-13 DIAGNOSIS — G894 Chronic pain syndrome: Secondary | ICD-10-CM | POA: Diagnosis not present

## 2013-05-13 DIAGNOSIS — Z79899 Other long term (current) drug therapy: Secondary | ICD-10-CM | POA: Diagnosis not present

## 2013-05-13 NOTE — Progress Notes (Signed)
Pt here this am for  [facility] stat back this pm for depo provera injection

## 2013-05-14 ENCOUNTER — Ambulatory Visit (INDEPENDENT_AMBULATORY_CARE_PROVIDER_SITE_OTHER): Payer: Medicare Other | Admitting: Adult Health

## 2013-05-14 ENCOUNTER — Ambulatory Visit: Payer: Medicare Other

## 2013-05-14 ENCOUNTER — Encounter (INDEPENDENT_AMBULATORY_CARE_PROVIDER_SITE_OTHER): Payer: Self-pay

## 2013-05-14 ENCOUNTER — Other Ambulatory Visit: Payer: Self-pay | Admitting: Family Medicine

## 2013-05-14 ENCOUNTER — Encounter: Payer: Self-pay | Admitting: Adult Health

## 2013-05-14 VITALS — BP 116/64 | Ht 62.0 in | Wt 155.0 lb

## 2013-05-14 DIAGNOSIS — Z3049 Encounter for surveillance of other contraceptives: Secondary | ICD-10-CM

## 2013-05-14 DIAGNOSIS — Z309 Encounter for contraceptive management, unspecified: Secondary | ICD-10-CM

## 2013-05-14 MED ORDER — MEDROXYPROGESTERONE ACETATE 150 MG/ML IM SUSP
150.0000 mg | Freq: Once | INTRAMUSCULAR | Status: AC
  Administered 2013-05-14: 150 mg via INTRAMUSCULAR

## 2013-05-14 NOTE — Progress Notes (Signed)
Pt here for Depo Shot. Not having any problems or concerns at this time. JSY

## 2013-05-18 ENCOUNTER — Encounter (HOSPITAL_COMMUNITY): Payer: Self-pay | Admitting: Emergency Medicine

## 2013-05-18 ENCOUNTER — Emergency Department (HOSPITAL_COMMUNITY)
Admission: EM | Admit: 2013-05-18 | Discharge: 2013-05-18 | Disposition: A | Discharge status: Home / Self Care | Payer: Medicare Other | Attending: Emergency Medicine | Admitting: Emergency Medicine

## 2013-05-18 ENCOUNTER — Emergency Department (HOSPITAL_COMMUNITY): Payer: Medicare Other

## 2013-05-18 DIAGNOSIS — Y929 Unspecified place or not applicable: Secondary | ICD-10-CM | POA: Insufficient documentation

## 2013-05-18 DIAGNOSIS — F329 Major depressive disorder, single episode, unspecified: Secondary | ICD-10-CM | POA: Diagnosis not present

## 2013-05-18 DIAGNOSIS — Y9389 Activity, other specified: Secondary | ICD-10-CM | POA: Insufficient documentation

## 2013-05-18 DIAGNOSIS — Z8619 Personal history of other infectious and parasitic diseases: Secondary | ICD-10-CM | POA: Insufficient documentation

## 2013-05-18 DIAGNOSIS — Z79899 Other long term (current) drug therapy: Secondary | ICD-10-CM | POA: Insufficient documentation

## 2013-05-18 DIAGNOSIS — S025XXA Fracture of tooth (traumatic), initial encounter for closed fracture: Secondary | ICD-10-CM | POA: Diagnosis not present

## 2013-05-18 DIAGNOSIS — G40909 Epilepsy, unspecified, not intractable, without status epilepticus: Secondary | ICD-10-CM | POA: Diagnosis not present

## 2013-05-18 DIAGNOSIS — G8929 Other chronic pain: Secondary | ICD-10-CM | POA: Insufficient documentation

## 2013-05-18 DIAGNOSIS — F3289 Other specified depressive episodes: Secondary | ICD-10-CM | POA: Insufficient documentation

## 2013-05-18 DIAGNOSIS — W06XXXA Fall from bed, initial encounter: Secondary | ICD-10-CM | POA: Insufficient documentation

## 2013-05-18 DIAGNOSIS — F17200 Nicotine dependence, unspecified, uncomplicated: Secondary | ICD-10-CM | POA: Insufficient documentation

## 2013-05-18 DIAGNOSIS — R569 Unspecified convulsions: Secondary | ICD-10-CM | POA: Diagnosis not present

## 2013-05-18 HISTORY — DX: Other chronic pain: G89.29

## 2013-05-18 HISTORY — DX: Headache, unspecified: R51.9

## 2013-05-18 HISTORY — DX: Headache: R51

## 2013-05-18 MED ORDER — LORAZEPAM 1 MG PO TABS
1.0000 mg | ORAL_TABLET | Freq: Once | ORAL | Status: AC
  Administered 2013-05-18: 1 mg via ORAL
  Filled 2013-05-18: qty 1

## 2013-05-18 NOTE — ED Notes (Signed)
Pt states she had a seizure earlier tonight, and when she came out of the seizure she was on the floor and had broken her front tooth, possibly hit her head

## 2013-05-18 NOTE — ED Provider Notes (Signed)
CSN: [id number]     Arrival date & time 05/18/13  0026 History   First MD Initiated Contact with Patient 05/18/13 0036     Chief Complaint  Patient presents with  . Seizures  . Dental Injury    HPI Pt was seen at 0055. Per pt, c/o sudden onset and resolution of one episode of falling out of bed PTA. Pt states she was watching TV and "then just woke up on the floor." States she saw "a piece of my tooth on the floor" and assumed she had a seizure that caused her to fall out of bed. Denies any other injuries. States she takes lamictal for her seizures; endorses compliance with LD last evening "before I went to bed." Denies choking, no SOB/wheezing, no CP, no abd pain, no N/V/D, no focal motor weakness, no tingling/numbness in extremities, no incont of bowel/bladder.    Past Medical History  Diagnosis Date  . Seizures   . Depression   . Back pain   . Genital warts     herpes  . Herpes   . Chronic headaches    Past Surgical History  Procedure Laterality Date  . Cesarean section      x 3    History  Substance Use Topics  . Smoking status: Current Every Day Smoker -- 1.00 packs/day    Types: Cigarettes  . Smokeless tobacco: Never Used  . Alcohol Use: 1.2 oz/week    2 Cans of beer per week     Comment: occasionally   OB History   Grav Para Term Preterm Abortions TAB SAB Ect Mult Living   3 3        2      Review of Systems ROS: Statement: All systems negative except as marked or noted in the HPI; Constitutional: +fall out of bed. Negative for fever and chills. ; ; Eyes: Negative for eye pain, redness and discharge. ; ; ENMT: +broken tooth. Negative for ear pain, hoarseness, nasal congestion, sinus pressure and sore throat. ; ; Cardiovascular: Negative for chest pain, palpitations, diaphoresis, dyspnea and peripheral edema. ; ; Respiratory: Negative for cough, wheezing and stridor. ; ; Gastrointestinal: Negative for nausea, vomiting, diarrhea, abdominal pain, blood in stool,  hematemesis, jaundice and rectal bleeding. . ; ; Genitourinary: Negative for dysuria, flank pain and hematuria. ; ; Musculoskeletal: Negative for back pain and neck pain. Negative for swelling and trauma.; ; Skin: Negative for pruritus, rash, abrasions, blisters, bruising and skin lesion.; ; Neuro: Negative for headache, lightheadedness and neck stiffness. Negative for weakness, altered level of consciousness , altered mental status, extremity weakness, paresthesias, involuntary movement.       Allergies  Morphine  Home Medications   Current Outpatient Rx  Name  Route  Sig  Dispense  Refill  . Cholecalciferol (VITAMIN D3) 50000 UNITS CAPS   Oral   Take 50,000 Units by mouth once a week.   12 capsule   1   . famciclovir (FAMVIR) 250 MG tablet   Oral   Take 1 tablet (250 mg total) by mouth 2 (two) times daily.   60 tablet   3   . fluconazole (DIFLUCAN) 150 MG tablet      One tablet once daily, as needed, for vaginal itch   2 tablet   0   . FLUoxetine (PROZAC) 10 MG capsule   Oral   Take 1 capsule (10 mg total) by mouth daily.   30 capsule   5   . LamoTRIgine (LAMICTAL  XR) 250 MG TB24   Oral   Take 500 mg by mouth at bedtime.         . levETIRAcetam (KEPPRA) 500 MG tablet   Oral   Take 1 tablet (500 mg total) by mouth every 12 (twelve) hours.   60 tablet   1   . medroxyPROGESTERone (DEPO-PROVERA) 150 MG/ML injection   Intramuscular   Inject 1 mL (150 mg total) into the muscle once.   1 mL   0     To be given at office visit   . metroNIDAZOLE (FLAGYL) 500 MG tablet   Oral   Take 1 tablet (500 mg total) by mouth 2 (two) times daily.   14 tablet   0   . oxyCODONE-acetaminophen (PERCOCET) 5-325 MG per tablet   Oral   Take 1 tablet by mouth every 4 (four) hours as needed for pain.   10 tablet   0   . oxyCODONE-acetaminophen (PERCOCET/ROXICET) 5-325 MG per tablet      1-2 tablets po q 6 hours prn moderate to severe pain   20 tablet   0   . topiramate  (TOPAMAX) 25 MG tablet   Oral   Take 25 mg by mouth daily.          [name] venlafaxine XR (EFFEXOR XR) 37.5 MG 24 hr capsule   Oral   Take 1 capsule (37.5 mg total) by mouth daily.   30 capsule   3     PLEASE DO NOT DISPENSE FLUOXETINE , CHANGE IN TREA ...    BP 125/87  Pulse 81  Temp(Src) 98.3 F (36.8 C) (Oral)  Resp 18  Ht 5\' 2"  (1.575 m)  Wt 140 lb (63.504 kg)  BMI 25.6 kg/m2  SpO2 100% Physical Exam 0100: Physical examination: Vital signs and O2 SAT: Reviewed; Constitutional: Well developed, Well nourished, Well hydrated, In no acute distress; Head and Face: Normocephalic, No scalp hematomas, no lacs.  Non-tender to palp superior and inferior orbital rim areas.  No zygoma tenderness.  No mandibular tenderness.; Eyes: EOMI, PERRL, No scleral icterus; ENMT: Mouth and pharynx normal, Left TM normal, Right TM normal, Mucous membranes moist, Tongue intact without lacerations. +right central incisor broken, no bleeding, no tooth avulsion. No intraoral or intranasal bleeding.  No septal hematomas.  No trismus, no malocclusion.; Neck: Supple, Full range of motion, No lymphadenopathy; Spine: No midline CS, TS, LS tenderness.; Cardiovascular: Regular rate and rhythm, No murmur, rub, or gallop; Respiratory: Breath sounds clear & equal bilaterally, No rales, rhonchi, wheezes, or rub, Normal respiratory effort/excursion; Chest: Nontender, No deformity, Movement normal, No crepitus; Abdomen: Soft, Nontender, Nondistended, Normal bowel sounds; Genitourinary: No CVA tenderness; Extremities: No deformity, Full range of motion, Neurovascularly intact, Pulses normal, No edema, Pelvis stable; Neuro: AA&Ox3, Gait normal, Normal coordination, Normal speech, No nystagmus, No facial droop, Major CN grossly intact.  No gross focal motor or sensory deficits in extremities.; Skin: Color normal, Warm, Dry    ED Course  Procedures    EKG Interpretation   None       MDM  MDM Reviewed: previous chart,  nursing note and vitals Interpretation: CT scan   Ct Head Wo Contrast 05/18/2013   CLINICAL DATA:  Seizure, possible head trauma.  EXAM: CT HEAD WITHOUT CONTRAST  TECHNIQUE: Contiguous axial images were obtained from the base of the skull through the vertex without intravenous contrast.  COMPARISON:  03/07/2013  FINDINGS: There is no evidence of acute intracranial hemorrhage, brain edema, mass lesion,  acute infarction, mass effect, or midline shift. Acute infarct may be in apparent on noncontrast CT. No other intra-axial abnormalities are seen, and the ventricles and sulci are within normal limits in size and symmetry. No abnormal extra-axial fluid collections or masses are identified. No significant calvarial abnormality.  IMPRESSION: 1. Negative for bleed or other acute intracranial process.   Electronically Signed   By: Oley Balm M.D.   On: 05/18/2013 01:58      0245:  Pt states she is no longer taking keppra for her seizures. States she was originally rx keppra in the ED, but her Neuro Dr. Gerilyn Pilgrim discontinued it because she "only has maybe 2 seizures a month." States she is only taking her lamictal, and she already took her nightly dose. VSS, resps easy, neuro exam remains intact. Pt wants to go home now. Dx and testing d/w pt.  Questions answered.  Verb understanding, agreeable to d/c home with outpt f/u.     Laray Anger, DO 05/20/13 2034

## 2013-06-09 ENCOUNTER — Other Ambulatory Visit: Payer: Self-pay

## 2013-06-09 ENCOUNTER — Encounter (HOSPITAL_COMMUNITY): Payer: Self-pay | Admitting: Emergency Medicine

## 2013-06-09 ENCOUNTER — Emergency Department (HOSPITAL_COMMUNITY)
Admission: EM | Admit: 2013-06-09 | Discharge: 2013-06-09 | Disposition: A | Discharge status: Home / Self Care | Payer: Medicare Other | Attending: Emergency Medicine | Admitting: Emergency Medicine

## 2013-06-09 DIAGNOSIS — F17200 Nicotine dependence, unspecified, uncomplicated: Secondary | ICD-10-CM | POA: Diagnosis not present

## 2013-06-09 DIAGNOSIS — G8929 Other chronic pain: Secondary | ICD-10-CM | POA: Diagnosis not present

## 2013-06-09 DIAGNOSIS — Y9389 Activity, other specified: Secondary | ICD-10-CM | POA: Insufficient documentation

## 2013-06-09 DIAGNOSIS — W503XXA Accidental bite by another person, initial encounter: Secondary | ICD-10-CM | POA: Insufficient documentation

## 2013-06-09 DIAGNOSIS — IMO0002 Reserved for concepts with insufficient information to code with codable children: Secondary | ICD-10-CM | POA: Insufficient documentation

## 2013-06-09 DIAGNOSIS — F329 Major depressive disorder, single episode, unspecified: Secondary | ICD-10-CM | POA: Insufficient documentation

## 2013-06-09 DIAGNOSIS — R32 Unspecified urinary incontinence: Secondary | ICD-10-CM | POA: Insufficient documentation

## 2013-06-09 DIAGNOSIS — G40909 Epilepsy, unspecified, not intractable, without status epilepticus: Secondary | ICD-10-CM

## 2013-06-09 DIAGNOSIS — F3289 Other specified depressive episodes: Secondary | ICD-10-CM | POA: Insufficient documentation

## 2013-06-09 DIAGNOSIS — Y929 Unspecified place or not applicable: Secondary | ICD-10-CM | POA: Insufficient documentation

## 2013-06-09 DIAGNOSIS — Z8619 Personal history of other infectious and parasitic diseases: Secondary | ICD-10-CM | POA: Diagnosis not present

## 2013-06-09 DIAGNOSIS — R569 Unspecified convulsions: Secondary | ICD-10-CM | POA: Diagnosis not present

## 2013-06-09 DIAGNOSIS — Z79899 Other long term (current) drug therapy: Secondary | ICD-10-CM | POA: Insufficient documentation

## 2013-06-09 LAB — RAPID URINE DRUG SCREEN, HOSP PERFORMED
Amphetamines: NOT DETECTED
Barbiturates: NOT DETECTED
Opiates: NOT DETECTED
Tetrahydrocannabinol: POSITIVE — AB

## 2013-06-09 LAB — BASIC METABOLIC PANEL
BUN: 5 mg/dL — ABNORMAL LOW (ref 6–23)
Calcium: 9.3 mg/dL (ref 8.4–10.5)
Creatinine, Ser: 0.82 mg/dL (ref 0.50–1.10)
GFR calc Af Amer: >90 mL/min (ref >90)
GFR calc non Af Amer: >90 mL/min (ref >90)
Glucose, Bld: 82 mg/dL (ref 70–99)
Sodium: 135 mEq/L (ref 135–145)

## 2013-06-09 LAB — URINALYSIS, ROUTINE W REFLEX MICROSCOPIC
Bilirubin Urine: NEGATIVE
Glucose, UA: NEGATIVE mg/dL
Hgb urine dipstick: NEGATIVE
Ketones, ur: NEGATIVE mg/dL
Protein, ur: NEGATIVE mg/dL

## 2013-06-09 LAB — CBC WITH DIFFERENTIAL/PLATELET
Eosinophils Absolute: 0 10*3/uL (ref 0.0–0.7)
Eosinophils Relative: 0 % (ref 0–5)
HCT: 42.3 % (ref 36.0–46.0)
Hemoglobin: 14.1 g/dL (ref 12.0–15.0)
Lymphs Abs: 1.2 10*3/uL (ref 0.7–4.0)
MCH: 32.6 pg (ref 26.0–34.0)
MCV: 97.9 fL (ref 78.0–100.0)
Neutro Abs: 5.3 10*3/uL (ref 1.7–7.7)
Platelets: 373 10*3/uL (ref 150–400)
RBC: 4.32 MIL/uL (ref 3.87–5.11)
RDW: 14 % (ref 11.5–15.5)
WBC: 7.1 10*3/uL (ref 4.0–10.5)

## 2013-06-09 LAB — CARBAMAZEPINE LEVEL, TOTAL: Carbamazepine Lvl: <0.5 ug/mL — ABNORMAL LOW (ref 4.0–12.0)

## 2013-06-09 NOTE — ED Notes (Signed)
Patient arrives via EMS from home with c/o altered mental status. Patient with seizure history, mother reports seizure for "15-20 minutes". Tongue injury noted, bleeding controlled. EMS reports patient was so altered that she was unable to verbalize, was taking clothes off. Patient alert and oriented to person only. Able to state birthday, but nothing else. Patient denies pain.

## 2013-06-09 NOTE — ED Notes (Signed)
Patient asking if her neurologist has returned call.  States she is ready to go home now, denies pain.  Concerned that her children are sitting in the waiting room age 34 & 28?  Family member in room with patient

## 2013-06-09 NOTE — ED Provider Notes (Signed)
CSN: [id number]     Arrival date & time 06/09/13  1545 History   First MD Initiated Contact with Patient 06/09/13 1621     Chief Complaint  Patient presents with  . Seizures   (Consider location/radiation/quality/duration/timing/severity/associated sxs/prior Treatment) HPI Comments: [name] is a 32 y.o. Female with a history of seizure disorder presenting with her second seizure today.  She reports having a nonwitnessed seizure this am prior to getting out of bed which she believes lasted just a few minutes.  This afternoon just prior to arrival she had another seizure which was witnessed by family,  Started with lip smacking,  Then progressed to a full grand mal with tonic clonic shaking lasting 15-20 minutes, and now resolved.  She was found by the paramedics to be confused  And unable to speak upon arrival, but has improved being A&O  during my exam.  She does report incontinence of urine during this last seizure and has injury to her tongue from biting it.  She denies prodromal warning signs of seizure. She is followed by Dr Gerilyn Pilgrim and has been on lamictal XR 500 mg qhs and reports has not missed any doses.  She denies fevers, chills, headache,  Visual changes or other complaints at this time.     Patient is a 32 y.o. female presenting with seizures. The history is provided by the patient and a relative.  Seizures   Past Medical History  Diagnosis Date  . Seizures   . Depression   . Back pain   . Genital warts     herpes  . Herpes   . Chronic headaches    Past Surgical History  Procedure Laterality Date  . Cesarean section      x 3   No family history on file. History  Substance Use Topics  . Smoking status: Current Every Day Smoker -- 1.00 packs/day    Types: Cigarettes  . Smokeless tobacco: Never Used  . Alcohol Use: 1.2 oz/week    2 Cans of beer per week     Comment: occasionally   OB History   Grav Para Term Preterm Abortions TAB SAB Ect Mult Living   3  3        2      Review of Systems  Constitutional: Negative for fever and chills.  HENT: Negative for congestion and sore throat.   Eyes: Negative.  Negative for photophobia and visual disturbance.  Respiratory: Negative for chest tightness and shortness of breath.   Cardiovascular: Negative for chest pain.  Gastrointestinal: Negative for nausea and abdominal pain.  Genitourinary: Negative.   Musculoskeletal: Negative for arthralgias, joint swelling and neck pain.  Skin: Negative.  Negative for rash and wound.  Neurological: Positive for seizures. Negative for dizziness, weakness, light-headedness, numbness and headaches.  Psychiatric/Behavioral: Negative.     Allergies  Morphine  Home Medications   Current Outpatient Rx  Name  Route  Sig  Dispense  Refill  . LamoTRIgine (LAMICTAL XR) 250 MG TB24   Oral   Take 500 mg by mouth at bedtime.         . methocarbamol (ROBAXIN) 500 MG tablet   Oral   Take 500 mg by mouth every 6 (six) hours as needed for muscle spasms.         . rizatriptan (MAXALT) 10 MG tablet   Oral   Take 10 mg by mouth as needed for migraine. May repeat in 2 hours if needed         .  venlafaxine XR (EFFEXOR XR) 37.5 MG 24 hr capsule   Oral   Take 1 capsule (37.5 mg total) by mouth daily.   30 capsule   3     PLEASE DO NOT DISPENSE FLUOXETINE , CHANGE IN TREA ...   . FLUoxetine (PROZAC) 10 MG capsule   Oral   Take 1 capsule (10 mg total) by mouth daily.   30 capsule   5   . medroxyPROGESTERone (DEPO-PROVERA) 150 MG/ML injection   Intramuscular   Inject 1 mL (150 mg total) into the muscle once.   1 mL   0     To be given at office visit   . topiramate (TOPAMAX) 25 MG tablet   Oral   Take 25 mg by mouth daily.           BP 114/69  Pulse 84  Temp(Src) 98.3 F (36.8 C) (Oral)  Resp 20  Wt 145 lb (65.772 kg)  SpO2 98% Physical Exam  Nursing note and vitals reviewed. Constitutional: She is oriented to person, place, and time. She  appears well-developed and well-nourished.  Uncomfortable appearing  HENT:  Head: Normocephalic.  Mouth/Throat: Oropharynx is clear and moist.  Bite marks/abrasions lateral tongue edges with no deep laceration. Hemostatic.  Eyes: Conjunctivae and EOM are normal. Pupils are equal, round, and reactive to light.  Neck: Normal range of motion. Neck supple.  Cardiovascular: Normal rate, regular rhythm, normal heart sounds and intact distal pulses.   Pulmonary/Chest: Effort normal and breath sounds normal. She has no wheezes.  Abdominal: Soft. Bowel sounds are normal. There is no tenderness.  Musculoskeletal: Normal range of motion.  Lymphadenopathy:    She has no cervical adenopathy.  Neurological: She is alert and oriented to person, place, and time. She has normal strength. No cranial nerve deficit or sensory deficit. Gait normal. GCS eye subscore is 4. GCS verbal subscore is 5. GCS motor subscore is 6.  Cranial nerves III-XII intact.  No pronator drift. Appears tired, but awake and appropriate.  Skin: Skin is warm and dry. No rash noted.  Psychiatric: She has a normal mood and affect. Her speech is normal and behavior is normal. Thought content normal. Cognition and memory are normal.    ED Course  Procedures (including critical care time) Labs Review Labs Reviewed  BASIC METABOLIC PANEL - Abnormal; Notable for the following:    BUN 5 (*)    All other components within normal limits  CARBAMAZEPINE LEVEL, TOTAL - Abnormal; Notable for the following:    Carbamazepine Lvl <0.5 (*)    All other components within normal limits  URINALYSIS, ROUTINE W REFLEX MICROSCOPIC - Abnormal; Notable for the following:    Specific Gravity, Urine <1.005 (*)    All other components within normal limits  URINE RAPID DRUG SCREEN (HOSP PERFORMED) - Abnormal; Notable for the following:    Tetrahydrocannabinol POSITIVE (*)    All other components within normal limits  CBC WITH DIFFERENTIAL   Imaging  Review No results found.  EKG Interpretation   None       MDM   1. Seizure disorder      Patients labs and/or radiological studies were viewed and considered during the medical decision making and disposition process. Discussed case with Dr Gerilyn Pilgrim.  Did not want lamictal level sent.  He does have a followup appt with her first week of December.  Advised her to keep this appt.  Will not add any new meds at this time. Patient has  not always been compliant with her meds,  Although she endorses today that she has been.  She was advised to return here if her seizures continue to have increased frequency.  She will go home in her mothers and teenage sons care.  Also advised against dangerous activity including driving until approved by her neurologist to do this.     Burgess Amor, PA-C 06/10/13 0134  Burgess Amor, PA-C 06/10/13 4132

## 2013-06-10 NOTE — ED Provider Notes (Signed)
Medical screening examination/treatment/procedure(s) were performed by non-physician practitioner and as supervising physician I was immediately available for consultation/collaboration.  EKG Interpretation   None        Flint Melter, MD 06/10/13 1146

## 2013-06-14 DIAGNOSIS — R51 Headache: Secondary | ICD-10-CM | POA: Diagnosis not present

## 2013-06-14 DIAGNOSIS — G40919 Epilepsy, unspecified, intractable, without status epilepticus: Secondary | ICD-10-CM | POA: Diagnosis not present

## 2013-06-14 DIAGNOSIS — Z79899 Other long term (current) drug therapy: Secondary | ICD-10-CM | POA: Diagnosis not present

## 2013-06-14 DIAGNOSIS — G894 Chronic pain syndrome: Secondary | ICD-10-CM | POA: Diagnosis not present

## 2013-07-12 DIAGNOSIS — Z79899 Other long term (current) drug therapy: Secondary | ICD-10-CM | POA: Diagnosis not present

## 2013-07-12 DIAGNOSIS — G894 Chronic pain syndrome: Secondary | ICD-10-CM | POA: Diagnosis not present

## 2013-07-12 DIAGNOSIS — R51 Headache: Secondary | ICD-10-CM | POA: Diagnosis not present

## 2013-07-12 DIAGNOSIS — G40909 Epilepsy, unspecified, not intractable, without status epilepticus: Secondary | ICD-10-CM | POA: Diagnosis not present

## 2013-07-13 DIAGNOSIS — Z79899 Other long term (current) drug therapy: Secondary | ICD-10-CM | POA: Diagnosis not present

## 2013-07-13 DIAGNOSIS — R51 Headache: Secondary | ICD-10-CM | POA: Diagnosis not present

## 2013-07-13 DIAGNOSIS — G40919 Epilepsy, unspecified, intractable, without status epilepticus: Secondary | ICD-10-CM | POA: Diagnosis not present

## 2013-07-13 DIAGNOSIS — G894 Chronic pain syndrome: Secondary | ICD-10-CM | POA: Diagnosis not present

## 2013-07-29 ENCOUNTER — Ambulatory Visit: Payer: Medicare Other | Admitting: Family Medicine

## 2013-08-05 ENCOUNTER — Ambulatory Visit: Payer: Medicare Other

## 2013-08-06 ENCOUNTER — Ambulatory Visit: Payer: Medicare Other

## 2013-08-09 ENCOUNTER — Ambulatory Visit (INDEPENDENT_AMBULATORY_CARE_PROVIDER_SITE_OTHER): Payer: Medicare Other | Admitting: Adult Health

## 2013-08-09 ENCOUNTER — Encounter: Payer: Self-pay | Admitting: Adult Health

## 2013-08-09 ENCOUNTER — Encounter: Payer: Self-pay | Admitting: Family Medicine

## 2013-08-09 ENCOUNTER — Ambulatory Visit (INDEPENDENT_AMBULATORY_CARE_PROVIDER_SITE_OTHER): Payer: Medicare Other | Admitting: Family Medicine

## 2013-08-09 VITALS — BP 118/74 | HR 83 | Resp 16 | Ht 62.0 in | Wt 151.8 lb

## 2013-08-09 VITALS — BP 112/78 | Ht 62.0 in | Wt 152.2 lb

## 2013-08-09 DIAGNOSIS — Z3049 Encounter for surveillance of other contraceptives: Secondary | ICD-10-CM

## 2013-08-09 DIAGNOSIS — F329 Major depressive disorder, single episode, unspecified: Secondary | ICD-10-CM | POA: Diagnosis not present

## 2013-08-09 DIAGNOSIS — B369 Superficial mycosis, unspecified: Secondary | ICD-10-CM

## 2013-08-09 DIAGNOSIS — Z3202 Encounter for pregnancy test, result negative: Secondary | ICD-10-CM | POA: Diagnosis not present

## 2013-08-09 DIAGNOSIS — F17200 Nicotine dependence, unspecified, uncomplicated: Secondary | ICD-10-CM | POA: Diagnosis not present

## 2013-08-09 DIAGNOSIS — R569 Unspecified convulsions: Secondary | ICD-10-CM | POA: Diagnosis not present

## 2013-08-09 DIAGNOSIS — R51 Headache: Secondary | ICD-10-CM | POA: Diagnosis not present

## 2013-08-09 DIAGNOSIS — Z309 Encounter for contraceptive management, unspecified: Secondary | ICD-10-CM

## 2013-08-09 LAB — POCT URINE PREGNANCY: Preg Test, Ur: NEGATIVE

## 2013-08-09 MED ORDER — MIRTAZAPINE 15 MG PO TBDP: 15.0000 mg | ORAL_TABLET | Freq: Every day | ORAL | Status: DC

## 2013-08-09 MED ORDER — MEDROXYPROGESTERONE ACETATE 150 MG/ML IM SUSP
150.0000 mg | Freq: Once | INTRAMUSCULAR | Status: AC
  Administered 2013-08-09: 150 mg via INTRAMUSCULAR

## 2013-08-09 MED ORDER — MIRTAZAPINE 15 MG PO TABS: 15.0000 mg | ORAL_TABLET | Freq: Every day | ORAL | Status: DC

## 2013-08-09 MED ORDER — CLOTRIMAZOLE-BETAMETHASONE 1-0.05 % EX CREA: 1.0000 "application " | TOPICAL_CREAM | Freq: Two times a day (BID) | CUTANEOUS | Status: DC

## 2013-08-09 NOTE — Assessment & Plan Note (Addendum)
2 week h/o rash on left neck, fungal rash present medication prescribed for topical use

## 2013-08-09 NOTE — Progress Notes (Signed)
Patient ID: [name], female   DOB: 1981/04/30, 33 y.o.   MRN: [id number] Depo provera 150 mg given left deltoid with no complications, resulted negative pregnancy test. Pt to return in 12 weeks injection.

## 2013-08-09 NOTE — Assessment & Plan Note (Addendum)
Uncontrolled due to non compliance,. Managed by neurology, pt re educate dre need to comply with treatment plan for control

## 2013-08-09 NOTE — Assessment & Plan Note (Signed)
Unchanged. Patient counseled for approximately 5 minutes regarding the health risks of ongoing nicotine use, specifically all types of cancer, heart disease, stroke and respiratory failure. The options available for help with cessation ,the behavioral changes to assist the process, and the option to either gradully reduce usage  Or abruptly stop.is also discussed. Pt is also encouraged to set specific goals in number of cigarettes used daily, as well as to set a quit date.  

## 2013-08-09 NOTE — Patient Instructions (Addendum)
F/u in  early April call if you need me before  Med sent in for depression and rash.  You are referred to behavioral health it is vital you go  You will be referrd to case manager for help through your cutrrent situation''You will receive info on smoking cessation classes at HD you need to stop smoking

## 2013-08-09 NOTE — Assessment & Plan Note (Addendum)
Pt recently suicdal, 4 weeks ago thought of overdosing on pills, tlold her children 2 weeks ago that she will soon die Refer to mental health and start remeron

## 2013-08-09 NOTE — Progress Notes (Signed)
   Subjective:    Patient ID: [name], female    DOB: July 19, 1981, 33 y.o.   MRN: [id number]  HPI The PT is here for follow up and re-evaluation of chronic medical conditions, medication management and review of any available recent lab and radiology data.  Preventive health is updated, specifically  Cancer screening and Immunization.   Sees neurology for headaches and seizures but does not take medication consistently so neither ie controlled C/o uncontrolled depression recently 4 weeks ago was suicidal and 2 weeks ago told gher children she would soon die, does not want this and wants help Rash on left neck x 2 week, itchy    Review of Systems See HPI Denies recent fever or chills. Denies sinus pressure, nasal congestion, ear pain or sore throat. Denies chest congestion, productive cough or wheezing. Denies chest pains, palpitations and leg swelling Denies abdominal pain, nausea, vomiting,diarrhea or constipation.   Denies dysuria, frequency, hesitancy or incontinence. Denies joint pain, swelling and limitation in mobility. Uncontrolle d headache and seizures, non compliant        Objective:   Physical Exam Patient alert and oriented and in no cardiopulmonary distress.  HEENT: No facial asymmetry, EOMI, no sinus tenderness,  oropharynx pink and moist.  Neck supple no adenopathy.  Chest: Clear to auscultation bilaterally.  CVS: S1, S2 no murmurs, no S3.  ABD: Soft non tender. Bowel sounds normal.  Ext: No edema  MS: Adequate ROM spine, shoulders, hips and knees.  Skin: tinea left neck  Psych: Good eye contact, flat  affect. Memory intact not anxious very depressed appearing.  CNS: CN 2-12 intact, power, tone and sensation normal throughout.        Assessment & Plan:  Depression, major Pt recently suicdal, 4 weeks ago thought of overdosing on pills, tlold her children 2 weeks ago that she will soon die Refer to mental health and start remeron  NICOTINE  ADDICTION Unchanged Patient counseled for approximately 5 minutes regarding the health risks of ongoing nicotine use, specifically all types of cancer, heart disease, stroke and respiratory failure. The options available for help with cessation ,the behavioral changes to assist the process, and the option to either gradully reduce usage  Or abruptly stop.is also discussed. Pt is also encouraged to set specific goals in number of cigarettes used daily, as well as to set a quit date.   SEIZURE DISORDER Uncontrolled due to non compliance  Headache Uncontrolled due to non compliance,. Managed by neurology, pt re educate dre need to comply with treatment plan for control  Dermatomycosis 2 week h/o rash on left neck, fungal rash present medication prescribed for topical use

## 2013-08-09 NOTE — Assessment & Plan Note (Signed)
Uncontrolled due to non compliance.

## 2013-08-23 DIAGNOSIS — G40909 Epilepsy, unspecified, not intractable, without status epilepticus: Secondary | ICD-10-CM | POA: Diagnosis not present

## 2013-08-23 DIAGNOSIS — G894 Chronic pain syndrome: Secondary | ICD-10-CM | POA: Diagnosis not present

## 2013-08-23 DIAGNOSIS — R51 Headache: Secondary | ICD-10-CM | POA: Diagnosis not present

## 2013-08-23 DIAGNOSIS — Z79899 Other long term (current) drug therapy: Secondary | ICD-10-CM | POA: Diagnosis not present

## 2013-08-25 ENCOUNTER — Emergency Department (HOSPITAL_COMMUNITY)
Admission: EM | Admit: 2013-08-25 | Discharge: 2013-08-25 | Disposition: A | Discharge status: Home / Self Care | Payer: Medicare Other | Attending: Emergency Medicine | Admitting: Emergency Medicine

## 2013-08-25 ENCOUNTER — Encounter (HOSPITAL_COMMUNITY): Payer: Self-pay | Admitting: Emergency Medicine

## 2013-08-25 DIAGNOSIS — F329 Major depressive disorder, single episode, unspecified: Secondary | ICD-10-CM | POA: Diagnosis not present

## 2013-08-25 DIAGNOSIS — Z8619 Personal history of other infectious and parasitic diseases: Secondary | ICD-10-CM | POA: Insufficient documentation

## 2013-08-25 DIAGNOSIS — R569 Unspecified convulsions: Secondary | ICD-10-CM | POA: Diagnosis not present

## 2013-08-25 DIAGNOSIS — G40909 Epilepsy, unspecified, not intractable, without status epilepticus: Secondary | ICD-10-CM | POA: Diagnosis not present

## 2013-08-25 DIAGNOSIS — G8929 Other chronic pain: Secondary | ICD-10-CM | POA: Insufficient documentation

## 2013-08-25 DIAGNOSIS — Z79899 Other long term (current) drug therapy: Secondary | ICD-10-CM | POA: Insufficient documentation

## 2013-08-25 DIAGNOSIS — F3289 Other specified depressive episodes: Secondary | ICD-10-CM | POA: Diagnosis not present

## 2013-08-25 DIAGNOSIS — F17200 Nicotine dependence, unspecified, uncomplicated: Secondary | ICD-10-CM | POA: Diagnosis not present

## 2013-08-25 LAB — BASIC METABOLIC PANEL
BUN: 6 mg/dL (ref 6–23)
CO2: 22 mEq/L (ref 19–32)
Calcium: 9 mg/dL (ref 8.4–10.5)
Chloride: 103 mEq/L (ref 96–112)
Creatinine, Ser: 0.9 mg/dL (ref 0.50–1.10)
GFR calc Af Amer: >90 mL/min (ref >90)
GFR calc non Af Amer: 82 mL/min — ABNORMAL LOW (ref >90)
GLUCOSE: 86 mg/dL (ref 70–99)
POTASSIUM: 4.5 meq/L (ref 3.7–5.3)
Sodium: 137 mEq/L (ref 137–147)

## 2013-08-25 LAB — CBC WITH DIFFERENTIAL/PLATELET
Basophils Absolute: 0 10*3/uL (ref 0.0–0.1)
Basophils Relative: 0 % (ref 0–1)
EOS PCT: 0 % (ref 0–5)
Eosinophils Absolute: 0 10*3/uL (ref 0.0–0.7)
HEMATOCRIT: 42.7 % (ref 36.0–46.0)
Hemoglobin: 14.5 g/dL (ref 12.0–15.0)
LYMPHS ABS: 1.2 10*3/uL (ref 0.7–4.0)
Lymphocytes Relative: 14 % (ref 12–46)
MCH: 33.1 pg (ref 26.0–34.0)
MCHC: 34 g/dL (ref 30.0–36.0)
MCV: 97.5 fL (ref 78.0–100.0)
Monocytes Absolute: 0.6 10*3/uL (ref 0.1–1.0)
Monocytes Relative: 7 % (ref 3–12)
NEUTROS PCT: 80 % — AB (ref 43–77)
Neutro Abs: 7 10*3/uL (ref 1.7–7.7)
Platelets: 319 10*3/uL (ref 150–400)
RBC: 4.38 MIL/uL (ref 3.87–5.11)
RDW: 13.8 % (ref 11.5–15.5)
WBC: 8.8 10*3/uL (ref 4.0–10.5)

## 2013-08-25 MED ORDER — LORAZEPAM 2 MG/ML IJ SOLN
1.0000 mg | Freq: Once | INTRAMUSCULAR | Status: AC
  Administered 2013-08-25: 1 mg via INTRAVENOUS
  Filled 2013-08-25: qty 1

## 2013-08-25 MED ORDER — HYDROCODONE-ACETAMINOPHEN 5-325 MG PO TABS
1.0000 | ORAL_TABLET | Freq: Once | ORAL | Status: AC
  Administered 2013-08-25: 1 via ORAL
  Filled 2013-08-25: qty 1

## 2013-08-25 MED ORDER — LAMOTRIGINE 100 MG PO TABS
250.0000 mg | ORAL_TABLET | Freq: Two times a day (BID) | ORAL | Status: DC
  Administered 2013-08-25: 250 mg via ORAL
  Filled 2013-08-25 (×2): qty 2.5

## 2013-08-25 MED ORDER — LAMOTRIGINE 100 MG PO TABS
250.0000 mg | ORAL_TABLET | Freq: Two times a day (BID) | ORAL | Status: DC
  Filled 2013-08-25 (×2): qty 2.5

## 2013-08-25 MED ORDER — LAMOTRIGINE 150 MG PO TABS
250.0000 mg | ORAL_TABLET | Freq: Two times a day (BID) | ORAL | Status: DC
  Filled 2013-08-25 (×2): qty 1

## 2013-08-25 MED ORDER — LAMOTRIGINE 200 MG PO TABS: 500.0000 mg | ORAL_TABLET | Freq: Once | ORAL | Status: DC

## 2013-08-25 NOTE — ED Provider Notes (Signed)
CSN: [id number]     Arrival date & time 08/25/13  1524 History   First MD Initiated Contact with Patient 08/25/13 1529     Chief Complaint  Patient presents with  . Seizures   (Consider location/radiation/quality/duration/timing/severity/associated sxs/prior Treatment) Patient is a 33 y.o. female presenting with seizures. The history is provided by the patient (the pt ran out of her medicine 3 days ago and had a sz today.  she is slightly post ictal).  Seizures Seizure activity on arrival: no   Seizure type:  Focal Preceding symptoms: no sensation of an aura present   Initial focality:  None Episode characteristics: no apnea   Postictal symptoms: confusion   Return to baseline: yes     Past Medical History  Diagnosis Date  . Seizures   . Depression   . Back pain   . Genital warts     herpes  . Herpes   . Chronic headaches    Past Surgical History  Procedure Laterality Date  . Cesarean section      x 3   No family history on file. History  Substance Use Topics  . Smoking status: Current Every Day Smoker -- 1.00 packs/day    Types: Cigarettes  . Smokeless tobacco: Never Used  . Alcohol Use: 1.2 oz/week    2 Cans of beer per week     Comment: occasionally   OB History   Grav Para Term Preterm Abortions TAB SAB Ect Mult Living   3 3        2      Review of Systems  Constitutional: Negative for appetite change and fatigue.  HENT: Negative for congestion, ear discharge and sinus pressure.   Eyes: Negative for discharge.  Respiratory: Negative for cough.   Cardiovascular: Negative for chest pain.  Gastrointestinal: Negative for abdominal pain and diarrhea.  Genitourinary: Negative for frequency and hematuria.  Musculoskeletal: Negative for back pain.  Skin: Negative for rash.  Neurological: Positive for seizures. Negative for headaches.  Psychiatric/Behavioral: Negative for hallucinations.    Allergies  Morphine  Home Medications   Current Outpatient Rx   Name  Route  Sig  Dispense  Refill  . clotrimazole-betamethasone (LOTRISONE) cream   Topical   Apply 1 application topically 2 (two) times daily.   45 g   1   . LamoTRIgine (LAMICTAL XR) 250 MG TB24   Oral   Take 500 mg by mouth at bedtime.         . medroxyPROGESTERone (DEPO-PROVERA) 150 MG/ML injection   Intramuscular   Inject 1 mL (150 mg total) into the muscle once.   1 mL   0     To be given at office visit   . methocarbamol (ROBAXIN) 500 MG tablet   Oral   Take 500 mg by mouth every 6 (six) hours as needed for muscle spasms.         . mirtazapine (REMERON) 15 MG tablet   Oral   Take 1 tablet (15 mg total) by mouth at bedtime.   30 tablet   2     D/c fluoxetine and effexor   . rizatriptan (MAXALT) 10 MG tablet   Oral   Take 10 mg by mouth as needed for migraine. May repeat in 2 hours if needed         . topiramate (TOPAMAX) 25 MG tablet   Oral   Take 25 mg by mouth daily.  BP 112/71  Pulse 96  Temp(Src) 98.8 F (37.1 C) (Oral)  Resp 18  Ht 5\' 2"  (1.575 m)  Wt 154 lb (69.854 kg)  BMI 28.16 kg/m2  SpO2 99% Physical Exam  Constitutional: She is oriented to person, place, and time. She appears well-developed.  HENT:  Head: Normocephalic.  Eyes: Conjunctivae and EOM are normal. No scleral icterus.  Neck: Neck supple. No thyromegaly present.  Cardiovascular: Normal rate and regular rhythm.  Exam reveals no gallop and no friction rub.   No murmur heard. Pulmonary/Chest: No stridor. She has no wheezes. She has no rales. She exhibits no tenderness.  Abdominal: She exhibits no distension. There is no tenderness. There is no rebound.  Musculoskeletal: Normal range of motion. She exhibits no edema.  Lymphadenopathy:    She has no cervical adenopathy.  Neurological: She is oriented to person, place, and time. She exhibits normal muscle tone. Coordination normal.  Skin: No rash noted. No erythema.  Psychiatric: She has a normal mood and  affect. Her behavior is normal.    ED Course  Procedures (including critical care time) Labs Review Labs Reviewed  CBC WITH DIFFERENTIAL - Abnormal; Notable for the following:    Neutrophils Relative % 80 (*)    All other components within normal limits  BASIC METABOLIC PANEL - Abnormal; Notable for the following:    GFR calc non Af Amer 82 (*)    All other components within normal limits   Imaging Review No results found.  EKG Interpretation   None       MDM   1. Seizures    The chart was scribed for me under my direct supervision.  I personally performed the history, physical, and medical decision making and all procedures in the evaluation of this patient.Benny Lennert, MD 08/25/13 [phone]

## 2013-08-25 NOTE — Discharge Instructions (Signed)
Start taking your seizure medicine tonight

## 2013-08-25 NOTE — ED Notes (Signed)
Patient with no complaints at this time. Respirations even and unlabored. Skin warm/dry. Discharge instructions reviewed with patient at this time. Patient given opportunity to voice concerns/ask questions. IV removed per policy and band-aid applied to site. Patient discharged at this time and left Emergency Department with steady gait.  

## 2013-08-25 NOTE — ED Notes (Signed)
Seizure x 4 today. Pt states she has not taken medication in 2 days, today being the 3rd day. States she went to pick med up from pharmacy yesterday and pharmacy had to order it.

## 2013-09-02 ENCOUNTER — Ambulatory Visit (HOSPITAL_COMMUNITY): Payer: Medicare Other | Admitting: Psychiatry

## 2013-09-10 ENCOUNTER — Telehealth (HOSPITAL_COMMUNITY): Payer: Self-pay | Admitting: *Deleted

## 2013-09-10 ENCOUNTER — Telehealth: Payer: Self-pay | Admitting: Family Medicine

## 2013-09-10 NOTE — Telephone Encounter (Signed)
noted 

## 2013-09-22 DIAGNOSIS — G894 Chronic pain syndrome: Secondary | ICD-10-CM | POA: Diagnosis not present

## 2013-09-22 DIAGNOSIS — R51 Headache: Secondary | ICD-10-CM | POA: Diagnosis not present

## 2013-09-22 DIAGNOSIS — Z79899 Other long term (current) drug therapy: Secondary | ICD-10-CM | POA: Diagnosis not present

## 2013-09-22 DIAGNOSIS — G40909 Epilepsy, unspecified, not intractable, without status epilepticus: Secondary | ICD-10-CM | POA: Diagnosis not present

## 2013-09-23 DIAGNOSIS — G40909 Epilepsy, unspecified, not intractable, without status epilepticus: Secondary | ICD-10-CM | POA: Diagnosis not present

## 2013-09-23 DIAGNOSIS — Z79899 Other long term (current) drug therapy: Secondary | ICD-10-CM | POA: Diagnosis not present

## 2013-09-23 DIAGNOSIS — R51 Headache: Secondary | ICD-10-CM | POA: Diagnosis not present

## 2013-09-23 DIAGNOSIS — G894 Chronic pain syndrome: Secondary | ICD-10-CM | POA: Diagnosis not present

## 2013-10-11 ENCOUNTER — Other Ambulatory Visit: Payer: Self-pay

## 2013-10-11 MED ORDER — FAMCICLOVIR 250 MG PO TABS: 250.0000 mg | ORAL_TABLET | Freq: Two times a day (BID) | ORAL | Status: DC

## 2013-10-14 DIAGNOSIS — F17200 Nicotine dependence, unspecified, uncomplicated: Secondary | ICD-10-CM | POA: Diagnosis not present

## 2013-10-14 DIAGNOSIS — G894 Chronic pain syndrome: Secondary | ICD-10-CM | POA: Diagnosis not present

## 2013-10-14 DIAGNOSIS — Z79899 Other long term (current) drug therapy: Secondary | ICD-10-CM | POA: Diagnosis not present

## 2013-10-14 DIAGNOSIS — G40909 Epilepsy, unspecified, not intractable, without status epilepticus: Secondary | ICD-10-CM | POA: Diagnosis not present

## 2013-11-01 ENCOUNTER — Ambulatory Visit: Payer: Medicare Other

## 2013-11-03 ENCOUNTER — Encounter (HOSPITAL_COMMUNITY): Payer: Self-pay | Admitting: Emergency Medicine

## 2013-11-03 ENCOUNTER — Emergency Department (HOSPITAL_COMMUNITY)
Admission: EM | Admit: 2013-11-03 | Discharge: 2013-11-04 | Discharge status: Against Medical Advice | Payer: Medicare Other | Attending: Emergency Medicine | Admitting: Emergency Medicine

## 2013-11-03 DIAGNOSIS — G8929 Other chronic pain: Secondary | ICD-10-CM | POA: Diagnosis not present

## 2013-11-03 DIAGNOSIS — Z8659 Personal history of other mental and behavioral disorders: Secondary | ICD-10-CM | POA: Insufficient documentation

## 2013-11-03 DIAGNOSIS — F17200 Nicotine dependence, unspecified, uncomplicated: Secondary | ICD-10-CM | POA: Diagnosis not present

## 2013-11-03 DIAGNOSIS — R569 Unspecified convulsions: Secondary | ICD-10-CM | POA: Diagnosis not present

## 2013-11-03 DIAGNOSIS — Z8619 Personal history of other infectious and parasitic diseases: Secondary | ICD-10-CM | POA: Diagnosis not present

## 2013-11-03 NOTE — ED Notes (Signed)
Patient brought in by EMS, states she had an unwitnessed seizure at home. EMS states patient was disoriented upon arrival. Patient currently sitting up in bed, alert and oriented, ambulatory, and per EMS patient smoking a cigarette on the stretcher prior to being brought into ED doors. States she was told several times to not smoke, but patient refused to put it out.

## 2013-11-04 NOTE — ED Notes (Signed)
Patient removed her IV and stated she was leaving.  Patient ambulatory with steady gait; left department.

## 2013-11-05 ENCOUNTER — Encounter: Payer: Self-pay | Admitting: Adult Health

## 2013-11-05 ENCOUNTER — Ambulatory Visit (INDEPENDENT_AMBULATORY_CARE_PROVIDER_SITE_OTHER): Payer: Medicare Other | Admitting: Adult Health

## 2013-11-05 VITALS — BP 124/72 | Ht 62.0 in | Wt 165.0 lb

## 2013-11-05 DIAGNOSIS — Z3049 Encounter for surveillance of other contraceptives: Secondary | ICD-10-CM | POA: Diagnosis not present

## 2013-11-05 DIAGNOSIS — Z309 Encounter for contraceptive management, unspecified: Secondary | ICD-10-CM

## 2013-11-05 DIAGNOSIS — Z3202 Encounter for pregnancy test, result negative: Secondary | ICD-10-CM

## 2013-11-05 LAB — POCT URINE PREGNANCY: Preg Test, Ur: NEGATIVE

## 2013-11-05 MED ORDER — MEDROXYPROGESTERONE ACETATE 150 MG/ML IM SUSP
150.0000 mg | Freq: Once | INTRAMUSCULAR | Status: AC
  Administered 2013-11-05: 150 mg via INTRAMUSCULAR

## 2013-11-05 NOTE — Progress Notes (Signed)
Patient ID: [name], female   DOB: 16-Mar-1981, 33 y.o.   MRN: [id number] Depo Provera 150 mg given right deltoid with no complications, resulted negative pregnancy test. Pt to return in 12 weeks for next injection.

## 2013-11-29 DIAGNOSIS — G43919 Migraine, unspecified, intractable, without status migrainosus: Secondary | ICD-10-CM | POA: Diagnosis not present

## 2013-11-29 DIAGNOSIS — R51 Headache: Secondary | ICD-10-CM | POA: Diagnosis not present

## 2013-11-29 DIAGNOSIS — Z79899 Other long term (current) drug therapy: Secondary | ICD-10-CM | POA: Diagnosis not present

## 2013-11-29 DIAGNOSIS — G40909 Epilepsy, unspecified, not intractable, without status epilepticus: Secondary | ICD-10-CM | POA: Diagnosis not present

## 2013-12-29 DIAGNOSIS — G894 Chronic pain syndrome: Secondary | ICD-10-CM | POA: Diagnosis not present

## 2013-12-29 DIAGNOSIS — F17200 Nicotine dependence, unspecified, uncomplicated: Secondary | ICD-10-CM | POA: Diagnosis not present

## 2013-12-29 DIAGNOSIS — R51 Headache: Secondary | ICD-10-CM | POA: Diagnosis not present

## 2013-12-29 DIAGNOSIS — G40909 Epilepsy, unspecified, not intractable, without status epilepticus: Secondary | ICD-10-CM | POA: Diagnosis not present

## 2013-12-29 DIAGNOSIS — G44209 Tension-type headache, unspecified, not intractable: Secondary | ICD-10-CM | POA: Diagnosis not present

## 2013-12-29 DIAGNOSIS — Z79899 Other long term (current) drug therapy: Secondary | ICD-10-CM | POA: Diagnosis not present

## 2013-12-30 ENCOUNTER — Encounter (HOSPITAL_COMMUNITY): Payer: Self-pay | Admitting: Emergency Medicine

## 2013-12-30 ENCOUNTER — Emergency Department (HOSPITAL_COMMUNITY)
Admission: EM | Admit: 2013-12-30 | Discharge: 2013-12-30 | Disposition: A | Discharge status: Home / Self Care | Payer: Medicare Other | Attending: Emergency Medicine | Admitting: Emergency Medicine

## 2013-12-30 DIAGNOSIS — F3289 Other specified depressive episodes: Secondary | ICD-10-CM | POA: Diagnosis not present

## 2013-12-30 DIAGNOSIS — Z8619 Personal history of other infectious and parasitic diseases: Secondary | ICD-10-CM | POA: Insufficient documentation

## 2013-12-30 DIAGNOSIS — G40909 Epilepsy, unspecified, not intractable, without status epilepticus: Secondary | ICD-10-CM | POA: Diagnosis not present

## 2013-12-30 DIAGNOSIS — F329 Major depressive disorder, single episode, unspecified: Secondary | ICD-10-CM | POA: Diagnosis not present

## 2013-12-30 DIAGNOSIS — F17200 Nicotine dependence, unspecified, uncomplicated: Secondary | ICD-10-CM | POA: Insufficient documentation

## 2013-12-30 DIAGNOSIS — Z79899 Other long term (current) drug therapy: Secondary | ICD-10-CM | POA: Diagnosis not present

## 2013-12-30 DIAGNOSIS — G8929 Other chronic pain: Secondary | ICD-10-CM | POA: Diagnosis not present

## 2013-12-30 DIAGNOSIS — R569 Unspecified convulsions: Secondary | ICD-10-CM

## 2013-12-30 LAB — CBC WITH DIFFERENTIAL/PLATELET
Basophils Absolute: 0 10*3/uL (ref 0.0–0.1)
Basophils Relative: 0 % (ref 0–1)
EOS ABS: 0 10*3/uL (ref 0.0–0.7)
EOS PCT: 0 % (ref 0–5)
HCT: 41.7 % (ref 36.0–46.0)
Hemoglobin: 14.1 g/dL (ref 12.0–15.0)
LYMPHS ABS: 1.9 10*3/uL (ref 0.7–4.0)
Lymphocytes Relative: 23 % (ref 12–46)
MCH: 33.3 pg (ref 26.0–34.0)
MCHC: 33.8 g/dL (ref 30.0–36.0)
MCV: 98.3 fL (ref 78.0–100.0)
MONOS PCT: 6 % (ref 3–12)
Monocytes Absolute: 0.5 10*3/uL (ref 0.1–1.0)
Neutro Abs: 5.8 10*3/uL (ref 1.7–7.7)
Neutrophils Relative %: 71 % (ref 43–77)
PLATELETS: 353 10*3/uL (ref 150–400)
RBC: 4.24 MIL/uL (ref 3.87–5.11)
RDW: 13.4 % (ref 11.5–15.5)
WBC: 8.3 10*3/uL (ref 4.0–10.5)

## 2013-12-30 LAB — COMPREHENSIVE METABOLIC PANEL
ALT: 12 U/L (ref 0–35)
AST: 13 U/L (ref 0–37)
Albumin: 3.7 g/dL (ref 3.5–5.2)
Alkaline Phosphatase: 78 U/L (ref 39–117)
BUN: 6 mg/dL (ref 6–23)
CALCIUM: 8.8 mg/dL (ref 8.4–10.5)
CO2: 14 mEq/L — ABNORMAL LOW (ref 19–32)
Chloride: 101 mEq/L (ref 96–112)
Creatinine, Ser: 1.04 mg/dL (ref 0.50–1.10)
GFR calc non Af Amer: 69 mL/min — ABNORMAL LOW (ref >90)
GFR, EST AFRICAN AMERICAN: 80 mL/min — AB (ref >90)
Glucose, Bld: 107 mg/dL — ABNORMAL HIGH (ref 70–99)
Potassium: 3.9 mEq/L (ref 3.7–5.3)
SODIUM: 139 meq/L (ref 137–147)
TOTAL PROTEIN: 7.2 g/dL (ref 6.0–8.3)
Total Bilirubin: <0.2 mg/dL — ABNORMAL LOW (ref 0.3–1.2)

## 2013-12-30 MED ORDER — ACETAMINOPHEN 325 MG PO TABS
650.0000 mg | ORAL_TABLET | Freq: Once | ORAL | Status: AC
  Administered 2013-12-30: 650 mg via ORAL

## 2013-12-30 MED ORDER — ACETAMINOPHEN 325 MG PO TABS
ORAL_TABLET | ORAL | Status: AC
  Filled 2013-12-30: qty 2

## 2013-12-30 MED ORDER — LORAZEPAM 1 MG PO TABS
1.0000 mg | ORAL_TABLET | Freq: Once | ORAL | Status: AC
  Administered 2013-12-30: 1 mg via ORAL
  Filled 2013-12-30: qty 1

## 2013-12-30 NOTE — ED Provider Notes (Signed)
CSN: [id number]     Arrival date & time 12/30/13  1929 History   First MD Initiated Contact with Patient 12/30/13 1932 This chart was scribed for Benny LennertJoseph L Lizbet Cirrincione, MD by Valera CastleSteven Perry, ED Scribe. This patient was seen in room APA18/APA18 and the patient's care was started at 7:37 PM.     Chief Complaint  Patient presents with  . Seizures    (Consider location/radiation/quality/duration/timing/severity/associated sxs/prior Treatment) Patient is a 33 y.o. female presenting with seizures. The history is provided by the patient. No language interpreter was used.  Seizures Seizure activity on arrival: no   Episode characteristics: disorientation   Episode characteristics comment:  Altered speech Postictal symptoms: confusion   Number of seizures this episode:  4 Progression:  Partially resolved History of seizures: yes    HPI Comments: [name] is a 33 y.o. female brought in by EMS who presents to the Emergency Department complaining of seizure activity, onset shortly PTA. Per EMS pt had 4 episodes PTA and was post ictal en route but no seizure activity was observed. Pt denies remembering the incident. She states her mother and brother reports she was talking abnormally. She states she last had a seizure yesterday. She reports taking her seizure medication regularly. She denies any other symptoms.   PCP - Syliva OvermanMargaret Simpson, MD   Past Medical History  Diagnosis Date  . Seizures   . Depression   . Back pain   . Genital warts     herpes  . Herpes   . Chronic headaches    Past Surgical History  Procedure Laterality Date  . Cesarean section      x 3   History reviewed. No pertinent family history. History  Substance Use Topics  . Smoking status: Current Every Day Smoker -- 1.00 packs/day    Types: Cigarettes  . Smokeless tobacco: Never Used  . Alcohol Use: 1.2 oz/week    2 Cans of beer per week     Comment: occasionally   OB History   Grav Para Term Preterm Abortions  TAB SAB Ect Mult Living   3 3        2      Review of Systems  Constitutional: Negative for appetite change and fatigue.  HENT: Negative for congestion, ear discharge and sinus pressure.   Eyes: Negative for discharge.  Respiratory: Negative for cough.   Cardiovascular: Negative for chest pain.  Gastrointestinal: Negative for abdominal pain and diarrhea.  Genitourinary: Negative for frequency and hematuria.  Musculoskeletal: Negative for back pain.  Skin: Negative for rash.  Neurological: Positive for seizures. Negative for headaches.  Psychiatric/Behavioral: Positive for confusion. Negative for hallucinations.   Allergies  Morphine  Home Medications   Prior to Admission medications   Medication Sig Start Date End Date Taking? Authorizing Provider  clotrimazole-betamethasone (LOTRISONE) cream Apply 1 application topically 2 (two) times daily. 08/09/13   Kerri PerchesMargaret E Simpson, MD  famciclovir (FAMVIR) 250 MG tablet Take 1 tablet (250 mg total) by mouth 2 (two) times daily. 10/11/13   Kerri PerchesMargaret E Simpson, MD  LamoTRIgine (LAMICTAL XR) 250 MG TB24 Take 500 mg by mouth at bedtime.    Historical Provider, MD  medroxyPROGESTERone (DEPO-PROVERA) 150 MG/ML injection Inject 1 mL (150 mg total) into the muscle once. 01/26/13   Kerri PerchesMargaret E Simpson, MD  methocarbamol (ROBAXIN) 500 MG tablet Take 500 mg by mouth every 6 (six) hours as needed for muscle spasms.    Historical Provider, MD  mirtazapine (REMERON) 15 MG tablet  Take 1 tablet (15 mg total) by mouth at bedtime. 08/09/13 08/09/14  Kerri Perches, MD  rizatriptan (MAXALT) 10 MG tablet Take 10 mg by mouth as needed for migraine. May repeat in 2 hours if needed    Historical Provider, MD  topiramate (TOPAMAX) 25 MG tablet Take 25 mg by mouth daily.  01/14/13   Historical Provider, MD   BP 127/76  Pulse 102  Temp(Src) 98.6 F (37 C) (Oral)  Resp 14  Ht 5\' 2"  (1.575 m)  Wt 150 lb (68.04 kg)  BMI 27.43 kg/m2  SpO2 98% Physical Exam   Constitutional: She appears well-developed.  HENT:  Head: Normocephalic.  Eyes: Conjunctivae and EOM are normal. No scleral icterus.  Neck: Neck supple. No thyromegaly present.  Cardiovascular: Normal rate and regular rhythm.  Exam reveals no gallop and no friction rub.   No murmur heard. Pulmonary/Chest: Effort normal. No stridor. She has no wheezes. She has no rales. She exhibits no tenderness.  Abdominal: Soft. She exhibits no distension. There is no tenderness. There is no rebound.  Musculoskeletal: Normal range of motion. She exhibits no edema.  Lymphadenopathy:    She has no cervical adenopathy.  Neurological: She is alert. She exhibits normal muscle tone. Coordination normal.  Mild confusion. Pt does not remember the details of the incident.  Skin: No rash noted. No erythema.  Psychiatric: She has a normal mood and affect. Her behavior is normal.    ED Course  Procedures (including critical care time)  DIAGNOSTIC STUDIES: Oxygen Saturation is 98% on room air, normal by my interpretation.    COORDINATION OF CARE: 7:43 PM-Discussed treatment plan with pt at bedside and pt agreed to plan.   Medications  acetaminophen (TYLENOL) tablet 650 mg (650 mg Oral Given 12/30/13 2002)    Results for orders placed during the hospital encounter of 12/30/13  CBC WITH DIFFERENTIAL      Result Value Ref Range   WBC 8.3  4.0 - 10.5 K/uL   RBC 4.24  3.87 - 5.11 MIL/uL   Hemoglobin 14.1  12.0 - 15.0 g/dL   HCT 80.1  65.5 - 37.4 %   MCV 98.3  78.0 - 100.0 fL   MCH 33.3  26.0 - 34.0 pg   MCHC 33.8  30.0 - 36.0 g/dL   RDW 82.7  07.8 - 67.5 %   Platelets 353  150 - 400 K/uL   Neutrophils Relative % 71  43 - 77 %   Neutro Abs 5.8  1.7 - 7.7 K/uL   Lymphocytes Relative 23  12 - 46 %   Lymphs Abs 1.9  0.7 - 4.0 K/uL   Monocytes Relative 6  3 - 12 %   Monocytes Absolute 0.5  0.1 - 1.0 K/uL   Eosinophils Relative 0  0 - 5 %   Eosinophils Absolute 0.0  0.0 - 0.7 K/uL   Basophils Relative 0   0 - 1 %   Basophils Absolute 0.0  0.0 - 0.1 K/uL  COMPREHENSIVE METABOLIC PANEL      Result Value Ref Range   Sodium 139  137 - 147 mEq/L   Potassium 3.9  3.7 - 5.3 mEq/L   Chloride 101  96 - 112 mEq/L   CO2 14 (*) 19 - 32 mEq/L   Glucose, Bld 107 (*) 70 - 99 mg/dL   BUN 6  6 - 23 mg/dL   Creatinine, Ser 4.49  0.50 - 1.10 mg/dL   Calcium 8.8  8.4 -  10.5 mg/dL   Total Protein 7.2  6.0 - 8.3 g/dL   Albumin 3.7  3.5 - 5.2 g/dL   AST 13  0 - 37 U/L   ALT 12  0 - 35 U/L   Alkaline Phosphatase 78  39 - 117 U/L   Total Bilirubin <0.2 (*) 0.3 - 1.2 mg/dL   GFR calc non Af Amer 69 (*) >90 mL/min   GFR calc Af Amer 80 (*) >90 mL/min   No results found.   EKG Interpretation None      MDM   Final diagnoses:  None   Dr. Gerilyn Pilgrim rec.  obs admission.  The pt refused admission and will call dr. Gerilyn Pilgrim tomorrow The chart was scribed for me under my direct supervision.  I personally performed the history, physical, and medical decision making and all procedures in the evaluation of this patient.Benny Lennert, MD 12/30/13 2121

## 2013-12-30 NOTE — ED Notes (Signed)
EMS was called for 4 reported seizures at pt's home.  Per EMS, pt was post ictal en route but no additional seizure activity was observed.

## 2014-01-17 ENCOUNTER — Other Ambulatory Visit: Payer: Self-pay | Admitting: Family Medicine

## 2014-01-27 DIAGNOSIS — Z79899 Other long term (current) drug therapy: Secondary | ICD-10-CM | POA: Diagnosis not present

## 2014-01-27 DIAGNOSIS — R51 Headache: Secondary | ICD-10-CM | POA: Diagnosis not present

## 2014-01-27 DIAGNOSIS — G44209 Tension-type headache, unspecified, not intractable: Secondary | ICD-10-CM | POA: Diagnosis not present

## 2014-01-27 DIAGNOSIS — F17200 Nicotine dependence, unspecified, uncomplicated: Secondary | ICD-10-CM | POA: Diagnosis not present

## 2014-02-28 DIAGNOSIS — F17200 Nicotine dependence, unspecified, uncomplicated: Secondary | ICD-10-CM | POA: Diagnosis not present

## 2014-02-28 DIAGNOSIS — R51 Headache: Secondary | ICD-10-CM | POA: Diagnosis not present

## 2014-02-28 DIAGNOSIS — Z79899 Other long term (current) drug therapy: Secondary | ICD-10-CM | POA: Diagnosis not present

## 2014-02-28 DIAGNOSIS — G40909 Epilepsy, unspecified, not intractable, without status epilepticus: Secondary | ICD-10-CM | POA: Diagnosis not present

## 2014-03-01 ENCOUNTER — Ambulatory Visit: Payer: Medicare Other

## 2014-03-01 ENCOUNTER — Other Ambulatory Visit: Payer: Medicare Other

## 2014-03-02 ENCOUNTER — Telehealth: Payer: Self-pay | Admitting: *Deleted

## 2014-03-02 ENCOUNTER — Other Ambulatory Visit: Payer: Self-pay | Admitting: Family Medicine

## 2014-03-02 ENCOUNTER — Telehealth: Payer: Self-pay | Admitting: Obstetrics and Gynecology

## 2014-03-02 MED ORDER — FAMCICLOVIR 250 MG PO TABS: 250.0000 mg | ORAL_TABLET | Freq: Two times a day (BID) | ORAL | Status: DC

## 2014-03-02 MED ORDER — MEDROXYPROGESTERONE ACETATE 150 MG/ML IM SUSP
150.0000 mg | Freq: Once | INTRAMUSCULAR | Status: DC
Start: 2014-03-02 — End: 2014-09-01

## 2014-03-02 NOTE — Telephone Encounter (Signed)
Pt has a appt 03/18/14 at 10:15 pt needs her medications refilled famciclovir 250MG  pt uses [facility] in [place].

## 2014-03-02 NOTE — Telephone Encounter (Signed)
Left message depo called in

## 2014-03-02 NOTE — Telephone Encounter (Signed)
Pt is requesting refill on DepoProvera, last ov with us was 01/2013.  Please advise

## 2014-03-02 NOTE — Telephone Encounter (Signed)
Med sent in for 30 days only

## 2014-03-07 ENCOUNTER — Other Ambulatory Visit: Payer: Self-pay | Admitting: Adult Health

## 2014-03-07 ENCOUNTER — Ambulatory Visit: Payer: Medicare Other

## 2014-03-07 ENCOUNTER — Other Ambulatory Visit: Payer: Medicare Other

## 2014-03-07 ENCOUNTER — Ambulatory Visit (INDEPENDENT_AMBULATORY_CARE_PROVIDER_SITE_OTHER): Payer: Medicare Other | Admitting: Adult Health

## 2014-03-07 DIAGNOSIS — Z3042 Encounter for surveillance of injectable contraceptive: Secondary | ICD-10-CM

## 2014-03-07 DIAGNOSIS — Z3049 Encounter for surveillance of other contraceptives: Secondary | ICD-10-CM | POA: Diagnosis not present

## 2014-03-07 DIAGNOSIS — Z3200 Encounter for pregnancy test, result unknown: Secondary | ICD-10-CM

## 2014-03-07 LAB — HCG, QUANTITATIVE, PREGNANCY: hCG, Beta Chain, Quant, S: <1 m[IU]/mL

## 2014-03-07 MED ORDER — MEDROXYPROGESTERONE ACETATE 150 MG/ML IM SUSP
150.0000 mg | Freq: Once | INTRAMUSCULAR | Status: AC
  Administered 2014-03-07: 150 mg via INTRAMUSCULAR

## 2014-03-18 ENCOUNTER — Ambulatory Visit: Payer: Medicare Other | Admitting: Family Medicine

## 2014-04-11 ENCOUNTER — Encounter (HOSPITAL_COMMUNITY): Payer: Self-pay | Admitting: Emergency Medicine

## 2014-04-11 ENCOUNTER — Emergency Department (HOSPITAL_COMMUNITY): Payer: Medicare Other

## 2014-04-11 ENCOUNTER — Emergency Department (HOSPITAL_COMMUNITY)
Admission: EM | Admit: 2014-04-11 | Discharge: 2014-04-11 | Disposition: A | Discharge status: Home / Self Care | Payer: Medicare Other | Attending: Emergency Medicine | Admitting: Emergency Medicine

## 2014-04-11 DIAGNOSIS — G8929 Other chronic pain: Secondary | ICD-10-CM | POA: Diagnosis not present

## 2014-04-11 DIAGNOSIS — R569 Unspecified convulsions: Secondary | ICD-10-CM

## 2014-04-11 DIAGNOSIS — IMO0002 Reserved for concepts with insufficient information to code with codable children: Secondary | ICD-10-CM | POA: Insufficient documentation

## 2014-04-11 DIAGNOSIS — F17200 Nicotine dependence, unspecified, uncomplicated: Secondary | ICD-10-CM | POA: Insufficient documentation

## 2014-04-11 DIAGNOSIS — G40909 Epilepsy, unspecified, not intractable, without status epilepticus: Secondary | ICD-10-CM | POA: Insufficient documentation

## 2014-04-11 DIAGNOSIS — F329 Major depressive disorder, single episode, unspecified: Secondary | ICD-10-CM | POA: Diagnosis not present

## 2014-04-11 DIAGNOSIS — Z79899 Other long term (current) drug therapy: Secondary | ICD-10-CM | POA: Insufficient documentation

## 2014-04-11 DIAGNOSIS — Z8619 Personal history of other infectious and parasitic diseases: Secondary | ICD-10-CM | POA: Insufficient documentation

## 2014-04-11 DIAGNOSIS — F3289 Other specified depressive episodes: Secondary | ICD-10-CM | POA: Diagnosis not present

## 2014-04-11 DIAGNOSIS — R4182 Altered mental status, unspecified: Secondary | ICD-10-CM | POA: Diagnosis not present

## 2014-04-11 LAB — CBC WITH DIFFERENTIAL/PLATELET
BASOS ABS: 0 10*3/uL (ref 0.0–0.1)
BASOS PCT: 0 % (ref 0–1)
Eosinophils Absolute: 0 10*3/uL (ref 0.0–0.7)
Eosinophils Relative: 0 % (ref 0–5)
HCT: 42.4 % (ref 36.0–46.0)
Hemoglobin: 14.3 g/dL (ref 12.0–15.0)
LYMPHS PCT: 10 % — AB (ref 12–46)
Lymphs Abs: 1 10*3/uL (ref 0.7–4.0)
MCH: 32.6 pg (ref 26.0–34.0)
MCHC: 33.7 g/dL (ref 30.0–36.0)
MCV: 96.6 fL (ref 78.0–100.0)
Monocytes Absolute: 0.8 10*3/uL (ref 0.1–1.0)
Monocytes Relative: 7 % (ref 3–12)
NEUTROS ABS: 8.9 10*3/uL — AB (ref 1.7–7.7)
NEUTROS PCT: 83 % — AB (ref 43–77)
Platelets: 350 10*3/uL (ref 150–400)
RBC: 4.39 MIL/uL (ref 3.87–5.11)
RDW: 13.3 % (ref 11.5–15.5)
WBC: 10.7 10*3/uL — ABNORMAL HIGH (ref 4.0–10.5)

## 2014-04-11 LAB — BASIC METABOLIC PANEL
Anion gap: 18 — ABNORMAL HIGH (ref 5–15)
BUN: 5 mg/dL — ABNORMAL LOW (ref 6–23)
CO2: 14 mEq/L — ABNORMAL LOW (ref 19–32)
CREATININE: 0.81 mg/dL (ref 0.50–1.10)
Calcium: 8.7 mg/dL (ref 8.4–10.5)
Chloride: 102 mEq/L (ref 96–112)
GFR calc non Af Amer: >90 mL/min (ref >90)
Glucose, Bld: 87 mg/dL (ref 70–99)
POTASSIUM: 3.8 meq/L (ref 3.7–5.3)
Sodium: 134 mEq/L — ABNORMAL LOW (ref 137–147)

## 2014-04-11 LAB — HEPATIC FUNCTION PANEL
ALT: 29 U/L (ref 0–35)
AST: 21 U/L (ref 0–37)
Albumin: 3.1 g/dL — ABNORMAL LOW (ref 3.5–5.2)
Alkaline Phosphatase: 82 U/L (ref 39–117)
Bilirubin, Direct: <0.2 mg/dL (ref 0.0–0.3)
Total Bilirubin: 0.2 mg/dL — ABNORMAL LOW (ref 0.3–1.2)
Total Protein: 6.9 g/dL (ref 6.0–8.3)

## 2014-04-11 MED ORDER — LORAZEPAM 2 MG/ML IJ SOLN
2.0000 mg | Freq: Once | INTRAMUSCULAR | Status: AC
  Administered 2014-04-11: 2 mg via INTRAVENOUS

## 2014-04-11 MED ORDER — LORAZEPAM 2 MG/ML IJ SOLN
INTRAMUSCULAR | Status: AC
  Administered 2014-04-11: 2 mg via INTRAVENOUS
  Filled 2014-04-11: qty 1

## 2014-04-11 MED ORDER — LEVETIRACETAM IN NACL 500 MG/100ML IV SOLN
500.0000 mg | Freq: Once | INTRAVENOUS | Status: AC
  Administered 2014-04-11: 500 mg via INTRAVENOUS
  Filled 2014-04-11: qty 100

## 2014-04-11 MED ORDER — LAMOTRIGINE 25 MG PO TABS
50.0000 mg | ORAL_TABLET | Freq: Once | ORAL | Status: AC
  Administered 2014-04-11: 50 mg via ORAL
  Filled 2014-04-11: qty 2

## 2014-04-11 NOTE — ED Notes (Signed)
Pt brought in by [facility] for c/o seizure x 2; CBG 122; pt has some lacerations on her tongue; pt was found to be post-ictal when ems arrived; pt is alerted and oriented upon arrival to ED

## 2014-04-11 NOTE — ED Notes (Signed)
Lab called to draw lab work 

## 2014-04-11 NOTE — ED Provider Notes (Signed)
CSN: [id number]     Arrival date & time 04/11/14  4098 History  This chart was scribed for Benny Lennert, MD by Littie Deeds, ED Scribe. This patient was seen in room APA02/APA02 and the patient's care was started at 7:20 AM.      Chief Complaint  Patient presents with  . Seizures      The history is provided by the EMS personnel. The history is limited by the condition of the patient. No language interpreter was used.    LEVEL 5 Caveat - Seizure HPI Comments: [name] is a 33 y.o. female with a history of seizures brought in by EMS who presents to the Emergency Department complaining of seizures that began PTA. She was post-ictal when she arrived. Patient has missed two doses of her Lamictal due to them not being ready at the pharmacy, and her last dose was 4 days ago.    Past Medical History  Diagnosis Date  . Seizures   . Depression   . Back pain   . Genital warts     herpes  . Herpes   . Chronic headaches    Past Surgical History  Procedure Laterality Date  . Cesarean section      x 3   History reviewed. No pertinent family history. History  Substance Use Topics  . Smoking status: Current Every Day Smoker -- 1.00 packs/day    Types: Cigarettes  . Smokeless tobacco: Never Used  . Alcohol Use: 1.2 oz/week    2 Cans of beer per week     Comment: occasionally   OB History   Grav Para Term Preterm Abortions TAB SAB Ect Mult Living   Review of Systems  Unable to perform ROS: Acuity of condition    Allergies  Morphine  Home Medications   Prior to Admission medications   Medication Sig Start Date End Date Taking? Authorizing Provider  famciclovir (FAMVIR) 250 MG tablet TAKE 1 TABLET BY MOUTH TWICE DAILY   Yes Kerri Perches, MD  HYDROcodone-acetaminophen (NORCO) 7.5-325 MG per tablet Take 1 tablet by mouth every 6 (six) hours as needed for moderate pain.   Yes Historical Provider, MD  LamoTRIgine (LAMICTAL XR) 250 MG TB24 Take 500  mg by mouth at bedtime.   Yes Historical Provider, MD  methocarbamol (ROBAXIN) 500 MG tablet Take 500 mg by mouth every 6 (six) hours as needed for muscle spasms.   Yes Historical Provider, MD  Perampanel 4 MG TABS Take by mouth.   Yes Historical Provider, MD  rizatriptan (MAXALT) 10 MG tablet Take 10 mg by mouth as needed for migraine. May repeat in 2 hours if needed   Yes Historical Provider, MD  clotrimazole-betamethasone (LOTRISONE) cream Apply 1 application topically 2 (two) times daily. 08/09/13   Kerri Perches, MD  famciclovir (FAMVIR) 250 MG tablet Take 1 tablet (250 mg total) by mouth 2 (two) times daily. 03/02/14   Kerri Perches, MD  medroxyPROGESTERone (DEPO-PROVERA) 150 MG/ML injection Inject 1 mL (150 mg total) into the muscle once. 01/26/13   Kerri Perches, MD  medroxyPROGESTERone (DEPO-PROVERA) 150 MG/ML injection Inject 1 mL (150 mg total) into the muscle once. 03/02/14   Adline Potter, NP  mirtazapine (REMERON) 15 MG tablet Take 1 tablet (15 mg total) by mouth at bedtime. 08/09/13 08/09/14  Kerri Perches, MD  topiramate (TOPAMAX) 25 MG tablet Take 25 mg  by mouth daily.  01/14/13   Historical Provider, MD   BP 124/78  Pulse 96  Temp(Src) 98.2 F (36.8 C) (Oral)  Resp 25  Ht  (1.575 m)  Wt 150 lb (68.04 kg)  BMI 27.43 kg/m2  SpO2 93% Physical Exam  Nursing note and vitals reviewed. Constitutional: She appears well-developed.  Only responsive to pain stimuli  HENT:  Head: Normocephalic.  Eyes: Conjunctivae and EOM are normal. No scleral icterus.  Neck: Neck supple. No thyromegaly present.  Cardiovascular: Normal rate, regular rhythm and normal heart sounds.  Exam reveals no gallop and no friction rub.   No murmur heard. Pulmonary/Chest: Effort normal and breath sounds normal. No stridor. She has no wheezes. She has no rales. She exhibits no tenderness.  Abdominal: She exhibits no distension. There is no tenderness. There is no rebound.   Musculoskeletal: Normal range of motion. She exhibits no edema.  Lymphadenopathy:    She has no cervical adenopathy.  Neurological: She exhibits normal muscle tone. Coordination normal.  Skin: No rash noted. No erythema.  Psychiatric: She has a normal mood and affect. Her behavior is normal.    ED Course  Procedures  DIAGNOSTIC STUDIES: Oxygen Saturation is 93% on RA, adequate by my interpretation.    COORDINATION OF CARE: 7:24 AM-Discussed treatment plan which includes head CT, lab work, and Keppra with pt at bedside and pt agreed to plan.   Labs Review Labs Reviewed  CBC WITH DIFFERENTIAL - Abnormal; Notable for the following:    WBC 10.7 (*)    Neutrophils Relative % 83 (*)    Neutro Abs 8.9 (*)    Lymphocytes Relative 10 (*)    All other components within normal limits  BASIC METABOLIC PANEL  HEPATIC FUNCTION PANEL    Imaging Review No results found.   EKG Interpretation None      MDM   Final diagnoses:  None    sz from not taking medicine.  Pt will get medicine today  The chart was scribed for me under my direct supervision.  I personally performed the history, physical, and medical decision making and all procedures in the evaluation of this patient.Benny Lennert, MD 04/11/14 [phone]

## 2014-04-11 NOTE — ED Notes (Signed)
Pt missed two doses of her lamictal due to not being ready at the pharmacy, last dose was on Thursday.

## 2014-04-11 NOTE — ED Notes (Addendum)
Pt. Had a witnessed seizure, Dr. Bebe Shaggy at bedside with orders. Before seizure pt sitting up in bed talking with mother.

## 2014-04-11 NOTE — ED Notes (Signed)
MD at bedside. 

## 2014-04-11 NOTE — ED Notes (Signed)
Pt . Trying to get up, states she is ready to go, pt. Updated on today's events, doesn't remember having another seizure while being here, pt. Still very sleepy.

## 2014-04-11 NOTE — ED Notes (Signed)
Pt. Responsive to painful stimuli only.

## 2014-04-11 NOTE — Discharge Instructions (Signed)
Get your medicine today

## 2014-04-11 NOTE — ED Notes (Signed)
Pharmacy called about need of Keppra.

## 2014-04-11 NOTE — ED Notes (Signed)
Pt wanting to go outside to smoke, informed pt that she can not leave with IV in place, spoke with EDP concerning pt ready to go and wants to go outside to smoke, pt upset and raising her voice, CN has spoke to pt as well

## 2014-04-11 NOTE — ED Provider Notes (Signed)
MSE was initiated and I personally evaluated the patient and placed orders (if any) at  7:02 AM on April 11, 2014.  Pt here for seizure.  She had seizure in the ED.  It terminated spontaneously. Ativan ordered. Pt is on monitor and in seizure precautions   Joya Gaskins, MD 04/11/14 [phone]

## 2014-04-15 ENCOUNTER — Other Ambulatory Visit: Payer: Self-pay

## 2014-04-15 MED ORDER — FAMCICLOVIR 250 MG PO TABS: 250.0000 mg | ORAL_TABLET | Freq: Two times a day (BID) | ORAL | Status: DC

## 2014-04-20 ENCOUNTER — Encounter (HOSPITAL_COMMUNITY): Payer: Self-pay | Admitting: Emergency Medicine

## 2014-04-20 ENCOUNTER — Emergency Department (HOSPITAL_COMMUNITY): Payer: Medicare Other

## 2014-04-20 ENCOUNTER — Emergency Department (HOSPITAL_COMMUNITY)
Admission: EM | Admit: 2014-04-20 | Discharge: 2014-04-20 | Disposition: A | Discharge status: Home / Self Care | Payer: Medicare Other | Attending: Emergency Medicine | Admitting: Emergency Medicine

## 2014-04-20 DIAGNOSIS — G40909 Epilepsy, unspecified, not intractable, without status epilepticus: Secondary | ICD-10-CM | POA: Insufficient documentation

## 2014-04-20 DIAGNOSIS — F329 Major depressive disorder, single episode, unspecified: Secondary | ICD-10-CM | POA: Diagnosis not present

## 2014-04-20 DIAGNOSIS — J029 Acute pharyngitis, unspecified: Secondary | ICD-10-CM | POA: Diagnosis not present

## 2014-04-20 DIAGNOSIS — M549 Dorsalgia, unspecified: Secondary | ICD-10-CM | POA: Diagnosis not present

## 2014-04-20 DIAGNOSIS — R1310 Dysphagia, unspecified: Secondary | ICD-10-CM | POA: Diagnosis not present

## 2014-04-20 DIAGNOSIS — Z791 Long term (current) use of non-steroidal anti-inflammatories (NSAID): Secondary | ICD-10-CM | POA: Insufficient documentation

## 2014-04-20 DIAGNOSIS — R0989 Other specified symptoms and signs involving the circulatory and respiratory systems: Secondary | ICD-10-CM

## 2014-04-20 DIAGNOSIS — Z79899 Other long term (current) drug therapy: Secondary | ICD-10-CM | POA: Insufficient documentation

## 2014-04-20 DIAGNOSIS — Z8619 Personal history of other infectious and parasitic diseases: Secondary | ICD-10-CM | POA: Diagnosis not present

## 2014-04-20 DIAGNOSIS — F17200 Nicotine dependence, unspecified, uncomplicated: Secondary | ICD-10-CM | POA: Diagnosis not present

## 2014-04-20 DIAGNOSIS — R6889 Other general symptoms and signs: Secondary | ICD-10-CM | POA: Diagnosis not present

## 2014-04-20 DIAGNOSIS — F3289 Other specified depressive episodes: Secondary | ICD-10-CM | POA: Diagnosis not present

## 2014-04-20 DIAGNOSIS — R51 Headache: Secondary | ICD-10-CM | POA: Insufficient documentation

## 2014-04-20 MED ORDER — BENZOCAINE 20 % MT SOLN
OROMUCOSAL | Status: AC
  Filled 2014-04-20: qty 57

## 2014-04-20 NOTE — ED Notes (Signed)
Pt feels as if a pill is stuck in her throat since last night

## 2014-04-20 NOTE — Discharge Instructions (Signed)
Your x-ray is negative for foreign body. Your examination is negative for stridor or any difficulty with  Your  airway.  direct view of  The back of your throat  and airway are negative for any acute changes or foreign body. There does appear to be a scratch in your posterior pharynx. Please do not eat or drink for the next hour. Salt water gargles maybe helpful starting tomorrow. Please see your primary physician or return to the emergency department if any changes, problems, or concerns.

## 2014-04-20 NOTE — ED Provider Notes (Signed)
Medical screening examination/treatment/procedure(s) were performed by non-physician practitioner and as supervising physician I was immediately available for consultation/collaboration.   EKG Interpretation None        Layla MawKristen N Ward, DO 04/20/14 1843

## 2014-04-20 NOTE — ED Provider Notes (Signed)
CSN: [id number]     Arrival date & time 04/20/14  1339 History   First MD Initiated Contact with Patient 04/20/14 1611     Chief Complaint  Patient presents with  . Foreign Body     (Consider location/radiation/quality/duration/timing/severity/associated sxs/prior Treatment) Patient is a 33 y.o. female presenting with foreign body. The history is provided by the patient.  Foreign Body Location:  Swallowed Suspected object: large pill. Quality: sensation of something in the throat. Pain severity:  Moderate Duration:  1 day Timing:  Constant Progression:  Unchanged Chronicity:  New Ineffective treatments: drinking water and eating soft bread. Associated symptoms: choking and sore throat   Associated symptoms: no abdominal pain, no cough, no difficulty breathing, no nosebleeds, no trouble swallowing and no voice change   Risk factors: no hx of esophageal strictures and no prior similar events     Past Medical History  Diagnosis Date  . Seizures   . Depression   . Back pain   . Genital warts     herpes  . Herpes   . Chronic headaches    Past Surgical History  Procedure Laterality Date  . Cesarean section      x 3   History reviewed. No pertinent family history. History  Substance Use Topics  . Smoking status: Current Every Day Smoker -- 1.00 packs/day    Types: Cigarettes  . Smokeless tobacco: Never Used  . Alcohol Use: 1.2 oz/week    2 Cans of beer per week     Comment: occasionally   OB History   Grav Para Term Preterm Abortions TAB SAB Ect Mult Living   3 3        2      Review of Systems  Constitutional: Negative for activity change.       All ROS Neg except as noted in HPI  HENT: Positive for sore throat. Negative for nosebleeds, trouble swallowing and voice change.   Eyes: Negative for photophobia and discharge.  Respiratory: Positive for choking. Negative for cough, shortness of breath and wheezing.   Cardiovascular: Negative for chest pain and  palpitations.  Gastrointestinal: Negative for abdominal pain and blood in stool.  Genitourinary: Negative for dysuria, frequency and hematuria.  Musculoskeletal: Positive for back pain. Negative for arthralgias and neck pain.  Skin: Negative.   Neurological: Positive for seizures and headaches. Negative for dizziness and speech difficulty.  Psychiatric/Behavioral: Negative for hallucinations and confusion.      Allergies  Morphine  Home Medications   Prior to Admission medications   Medication Sig Start Date End Date Taking? Authorizing Provider  famciclovir (FAMVIR) 250 MG tablet Take 1 tablet (250 mg total) by mouth 2 (two) times daily. 04/15/14   Kerri Perches, MD  HYDROcodone-acetaminophen (NORCO) 7.5-325 MG per tablet Take 1 tablet by mouth every 6 (six) hours as needed for severe pain.     Historical Provider, MD  LamoTRIgine (LAMICTAL XR) 50 MG TB24 Take 1 tablet by mouth daily.    Historical Provider, MD  medroxyPROGESTERone (DEPO-PROVERA) 150 MG/ML injection Inject 1 mL (150 mg total) into the muscle once. 03/02/14   Adline Potter, NP  methocarbamol (ROBAXIN) 500 MG tablet Take 500 mg by mouth 3 (three) times daily.     Historical Provider, MD  naproxen (NAPROSYN) 500 MG tablet Take 500 mg by mouth 2 (two) times daily with a meal.    Historical Provider, MD  Perampanel 4 MG TABS Take 1 tablet by mouth at bedtime.  Historical Provider, MD  rizatriptan (MAXALT) 10 MG tablet Take 10 mg by mouth as needed for migraine. May repeat in 2 hours if needed    Historical Provider, MD   BP 121/93  Pulse 87  Temp(Src) 99.5 F (37.5 C) (Oral)  Resp 16  Ht 5\' 2"  (1.575 m)  Wt 162 lb (73.483 kg)  BMI 29.62 kg/m2  SpO2 100% Physical Exam  Nursing note and vitals reviewed. Constitutional: She is oriented to person, place, and time. She appears well-developed and well-nourished.  Non-toxic appearance.  HENT:  Head: Normocephalic.  Right Ear: Tympanic membrane and external  ear normal.  Left Ear: Tympanic membrane and external ear normal.  Mouth/Throat: Uvula is midline and mucous membranes are normal. No oral lesions. No trismus in the jaw. No uvula swelling. Posterior oropharyngeal erythema present. No oropharyngeal exudate, posterior oropharyngeal edema or tonsillar abscesses.  Speech is clear and understandable.  Eyes: EOM and lids are normal. Pupils are equal, round, and reactive to light.  Neck: Normal range of motion. Neck supple. Carotid bruit is not present.  Cardiovascular: Normal rate, regular rhythm, normal heart sounds, intact distal pulses and normal pulses.   Pulmonary/Chest: Breath sounds normal. No respiratory distress.  Abdominal: Soft. Bowel sounds are normal. There is no tenderness. There is no guarding.  Musculoskeletal: Normal range of motion.  Lymphadenopathy:       Head (right side): No submandibular adenopathy present.       Head (left side): No submandibular adenopathy present.    She has no cervical adenopathy.  Neurological: She is alert and oriented to person, place, and time. She has normal strength. No cranial nerve deficit or sensory deficit.  Skin: Skin is warm and dry.  Psychiatric: She has a normal mood and affect. Her speech is normal.    ED Course  Procedures   DIRECT PHARYNGOSCOPY:  Patient identified by arm band. Permission for the procedure is given by the patient. Patient presents to the emergency department with a sensation of foreign body in the throat. The patient states that she thinks a peel is still stuck in her throat. This is been going on since last night.  The procedure was explained to the patient in terms which he understood and she gave consent, questions about the procedure were answered. The oropharynx was evaluated using tongue blade and flashlight and there were no foreign bodies noted in the posterior pharynx, the tongue was in the midline and there is no swelling noted. The uvula is in the midline and  no swelling noted. No foreign body noted in the upper portion of the posterior pharynx. Following this the patient had Harricaine spray administered with good anesthetic. The patient was then examined using the video glidescope.. No foreign body noted, no compromise of the posterior pharynx noted. There is noted on the left a scratch with mild increased redness around the area of the soft tissue. There is no significant swelling, there is no drainage, there is no foreign body in this area. The glidescope was removed without problem. The patient tolerated the procedure. I was able to allow the patient to see the reddened area on the video screen and if other explained to her why she was having the sensation that she was having. Patient tolerated the procedure without problem. Labs Review Labs Reviewed - No data to display  Imaging Review Dg Neck Soft Tissue  04/20/2014   CLINICAL DATA:  Sensation of foreign body, difficulty swallowing  EXAM: NECK SOFT TISSUES -  1+ VIEW  COMPARISON:  None.  FINDINGS: There is no evidence of retropharyngeal soft tissue swelling or epiglottic enlargement. The cervical airway is unremarkable and no radio-opaque foreign body identified.  IMPRESSION: No radiopaque foreign body is noted.   Electronically Signed   By: Alcide Clever M.D.   On: 04/20/2014 14:41     EKG Interpretation None      MDM  Temperature is 99.5, remainder the vital signs are within normal limits. The pulse oximetry is 100% on room air. The lungs are clear. There is no stridor over the trachea. The x-ray of the soft tissue neck shows no radiopaque foreign body appreciated. Direct pharyngoscopy reveals no evidence of foreign body, but does reveal a scratch area on left.  Have discussed the findings with the patient in detail. Discussed with her the need to use soft foods for the next few days and saltwater gargles. The patient is in agreement with the discharge plan and expresses understanding. Discussed  with the patient the need to refrain from eating or drinking over the next hour and she has had anesthetic spray to the back of the throat. Patient is to return to the emergency department if any changes, problems, or concerns.    Final diagnoses:  Foreign body sensation in throat    *I have reviewed nursing notes, vital signs, and all appropriate lab and imaging results for this patient.Kathie Dike, PA-C 04/20/14 1700

## 2014-04-25 DIAGNOSIS — G441 Vascular headache, not elsewhere classified: Secondary | ICD-10-CM | POA: Diagnosis not present

## 2014-04-25 DIAGNOSIS — Z79899 Other long term (current) drug therapy: Secondary | ICD-10-CM | POA: Diagnosis not present

## 2014-04-25 DIAGNOSIS — G4459 Other complicated headache syndrome: Secondary | ICD-10-CM | POA: Diagnosis not present

## 2014-04-25 DIAGNOSIS — G40909 Epilepsy, unspecified, not intractable, without status epilepticus: Secondary | ICD-10-CM | POA: Diagnosis not present

## 2014-04-25 DIAGNOSIS — G40901 Epilepsy, unspecified, not intractable, with status epilepticus: Secondary | ICD-10-CM | POA: Diagnosis not present

## 2014-04-25 DIAGNOSIS — Z720 Tobacco use: Secondary | ICD-10-CM | POA: Diagnosis not present

## 2014-05-23 ENCOUNTER — Encounter (HOSPITAL_COMMUNITY): Payer: Self-pay | Admitting: Emergency Medicine

## 2014-06-01 ENCOUNTER — Ambulatory Visit (INDEPENDENT_AMBULATORY_CARE_PROVIDER_SITE_OTHER): Payer: Medicare Other

## 2014-06-01 ENCOUNTER — Encounter (INDEPENDENT_AMBULATORY_CARE_PROVIDER_SITE_OTHER): Payer: Self-pay

## 2014-06-01 ENCOUNTER — Ambulatory Visit (INDEPENDENT_AMBULATORY_CARE_PROVIDER_SITE_OTHER): Payer: Medicare Other | Admitting: Family Medicine

## 2014-06-01 ENCOUNTER — Encounter: Payer: Self-pay | Admitting: Family Medicine

## 2014-06-01 VITALS — BP 104/68 | HR 68 | Resp 18 | Ht 62.0 in | Wt 163.1 lb

## 2014-06-01 DIAGNOSIS — B369 Superficial mycosis, unspecified: Secondary | ICD-10-CM | POA: Diagnosis not present

## 2014-06-01 DIAGNOSIS — Z79899 Other long term (current) drug therapy: Secondary | ICD-10-CM

## 2014-06-01 DIAGNOSIS — Z13220 Encounter for screening for lipoid disorders: Secondary | ICD-10-CM

## 2014-06-01 DIAGNOSIS — Z23 Encounter for immunization: Secondary | ICD-10-CM

## 2014-06-01 DIAGNOSIS — F3289 Other specified depressive episodes: Secondary | ICD-10-CM

## 2014-06-01 DIAGNOSIS — Z1329 Encounter for screening for other suspected endocrine disorder: Secondary | ICD-10-CM

## 2014-06-01 DIAGNOSIS — F17210 Nicotine dependence, cigarettes, uncomplicated: Secondary | ICD-10-CM | POA: Diagnosis not present

## 2014-06-01 DIAGNOSIS — F17200 Nicotine dependence, unspecified, uncomplicated: Secondary | ICD-10-CM

## 2014-06-01 DIAGNOSIS — F328 Other depressive episodes: Secondary | ICD-10-CM | POA: Diagnosis not present

## 2014-06-01 MED ORDER — VENLAFAXINE HCL 37.5 MG PO TABS: 37.5000 mg | ORAL_TABLET | Freq: Two times a day (BID) | ORAL | Status: DC

## 2014-06-01 MED ORDER — CLOTRIMAZOLE-BETAMETHASONE 1-0.05 % EX CREA: 1.0000 "application " | TOPICAL_CREAM | Freq: Two times a day (BID) | CUTANEOUS | Status: DC

## 2014-06-01 NOTE — Patient Instructions (Addendum)
Initial annual wellness end  January, call if you need me before  Flu vaccine today  New medication effexor every day for depression  Clotrimazole cream twice daily to groin for itch   Fasting lipid, TSH and CBC this week please  You wuillGet information on smoking cessation and help in the community to quit , you need to do this and you can

## 2014-06-01 NOTE — Progress Notes (Signed)
   Subjective:    Patient ID: [name], female    DOB: 1980-11-19, 33 y.o.   MRN: [id number]  HPI The PT is here for follow up and re-evaluation of chronic medical conditions, medication management and review of any available recent lab and radiology data.  Preventive health is updated, specifically  Cancer screening and Immunization.   Questions or concerns regarding consultations or procedures which the PT has had in the interim are  Addressed.Continues to see neurology for management of her seizures  The PT denies any adverse reactions to current medications since the last visit.  C/o increased symptoms of depression, feeling overwhelmed and poor level of functioning, not suicidal o homicidal, but has responded well to medication in the past and wants to resumeThere are no specific complaints       Review of Systems See HPI Denies recent fever or chills. Denies sinus pressure, nasal congestion, ear pain or sore throat. Denies chest congestion, productive cough or wheezing. Denies chest pains, palpitations and leg swelling Denies abdominal pain, nausea, vomiting,diarrhea or constipation.   Denies dysuria, frequency, hesitancy or incontinence. Denies joint pain, swelling and limitation in mobility. Denies headaches,, numbness, or tingling.  C/o pruritic rash in groin x 3 weeks       Objective:   Physical Exam  BP 104/68 mmHg  Pulse 68  Resp 18  Ht 5\' 2"  (1.575 m)  Wt 163 lb 1.3 oz (73.973 kg)  BMI 29.82 kg/m2  SpO2 98% Patient alert and oriented and in no cardiopulmonary distress.  HEENT: No facial asymmetry, EOMI,   oropharynx pink and moist.  Neck supple no JVD, no mass.  Chest: Clear to auscultation bilaterally.  CVS: S1, S2 no murmurs, no S3.Regular rate.  ABD: Soft non tender.   Ext: No edema  MS: Adequate ROM spine, shoulders, hips and knees.  Skin: tinea cruris, no bacterial supoerinfection Psych: Good eye contact, flat affect, tearful at times,  depressed, not anxious  CNS: CN 2-12 intact, power,  normal throughout.no focal deficits noted.       Assessment & Plan:  Depression, major Start effexor, encouraged to go to counseling wants to hold off on this Not suicidal or homicidal  NICOTINE ADDICTION Patient counseled for approximately 5 minutes regarding the health risks of ongoing nicotine use, specifically all types of cancer, heart disease, stroke and respiratory failure. The options available for help with cessation ,the behavioral changes to assist the process, and the option to either gradully reduce usage  Or abruptly stop.is also discussed. Pt is also encouraged to set specific goals in number of cigarettes used daily, as well as to set a quit date.  Number of cigarettes/cigars currently smoking daily: 10   Dermatomycosis Topical antifungal twice daily for 1 week, then as needed  Need for prophylactic vaccination and inoculation against influenza After obtaining informed consent, the vaccine is  administered by LPN.

## 2014-06-07 ENCOUNTER — Encounter: Payer: Medicare Other | Admitting: Adult Health

## 2014-06-07 NOTE — Progress Notes (Signed)
This encounter was created in error - please disregard.

## 2014-06-07 NOTE — Progress Notes (Signed)
Pt didn't have Depo with her. Pt wanted to reschedule for tomorrow. Appt scheduled for tomorrow. JSY

## 2014-06-08 ENCOUNTER — Encounter: Payer: Self-pay | Admitting: Adult Health

## 2014-06-08 ENCOUNTER — Ambulatory Visit (INDEPENDENT_AMBULATORY_CARE_PROVIDER_SITE_OTHER): Payer: Medicare Other | Admitting: Adult Health

## 2014-06-08 DIAGNOSIS — Z3202 Encounter for pregnancy test, result negative: Secondary | ICD-10-CM

## 2014-06-08 DIAGNOSIS — Z3042 Encounter for surveillance of injectable contraceptive: Secondary | ICD-10-CM

## 2014-06-08 LAB — POCT URINE PREGNANCY: Preg Test, Ur: NEGATIVE

## 2014-06-08 MED ORDER — MEDROXYPROGESTERONE ACETATE 150 MG/ML IM SUSP
150.0000 mg | Freq: Once | INTRAMUSCULAR | Status: AC
  Administered 2014-06-08: 150 mg via INTRAMUSCULAR

## 2014-06-08 NOTE — Progress Notes (Signed)
Pt's last Depo was 03/07/14. Depo was due between 11/2-11/16. I spoke with Dr. Despina HiddenEure and he said it was fine to go ahead and give Depo. JSY

## 2014-06-27 DIAGNOSIS — Z79899 Other long term (current) drug therapy: Secondary | ICD-10-CM | POA: Diagnosis not present

## 2014-06-27 DIAGNOSIS — G4459 Other complicated headache syndrome: Secondary | ICD-10-CM | POA: Diagnosis not present

## 2014-06-27 DIAGNOSIS — G40919 Epilepsy, unspecified, intractable, without status epilepticus: Secondary | ICD-10-CM | POA: Diagnosis not present

## 2014-08-15 ENCOUNTER — Encounter: Payer: Self-pay | Admitting: Family Medicine

## 2014-08-15 ENCOUNTER — Ambulatory Visit (INDEPENDENT_AMBULATORY_CARE_PROVIDER_SITE_OTHER): Payer: Medicare Other | Admitting: Family Medicine

## 2014-08-15 ENCOUNTER — Other Ambulatory Visit (HOSPITAL_COMMUNITY)
Admission: RE | Admit: 2014-08-15 | Discharge: 2014-08-15 | Disposition: A | Discharge status: Home / Self Care | Payer: Medicare Other | Source: Ambulatory Visit | Attending: Family Medicine | Admitting: Family Medicine

## 2014-08-15 VITALS — BP 102/60 | HR 78 | Resp 18 | Ht 62.0 in | Wt 166.0 lb

## 2014-08-15 DIAGNOSIS — Z113 Encounter for screening for infections with a predominantly sexual mode of transmission: Secondary | ICD-10-CM | POA: Insufficient documentation

## 2014-08-15 DIAGNOSIS — F17210 Nicotine dependence, cigarettes, uncomplicated: Secondary | ICD-10-CM

## 2014-08-15 DIAGNOSIS — R040 Epistaxis: Secondary | ICD-10-CM | POA: Diagnosis not present

## 2014-08-15 DIAGNOSIS — Z124 Encounter for screening for malignant neoplasm of cervix: Secondary | ICD-10-CM | POA: Diagnosis not present

## 2014-08-15 DIAGNOSIS — F3289 Other specified depressive episodes: Secondary | ICD-10-CM

## 2014-08-15 DIAGNOSIS — N760 Acute vaginitis: Secondary | ICD-10-CM

## 2014-08-15 DIAGNOSIS — Z0000 Encounter for general adult medical examination without abnormal findings: Secondary | ICD-10-CM | POA: Diagnosis not present

## 2014-08-15 DIAGNOSIS — F329 Major depressive disorder, single episode, unspecified: Secondary | ICD-10-CM

## 2014-08-15 DIAGNOSIS — Z1211 Encounter for screening for malignant neoplasm of colon: Secondary | ICD-10-CM

## 2014-08-15 DIAGNOSIS — F17200 Nicotine dependence, unspecified, uncomplicated: Secondary | ICD-10-CM

## 2014-08-15 MED ORDER — VENLAFAXINE HCL ER 75 MG PO CP24: 75.0000 mg | ORAL_CAPSULE | Freq: Every day | ORAL | Status: DC

## 2014-08-15 NOTE — Assessment & Plan Note (Signed)
Dose increase on effexor, pt was inadvertently taking lower dose than prescribed and referral to therapy

## 2014-08-15 NOTE — Assessment & Plan Note (Signed)
Single episode of left sided epistaxis several weeks ago, will refer to ENT

## 2014-08-15 NOTE — Assessment & Plan Note (Signed)

## 2014-08-15 NOTE — Assessment & Plan Note (Signed)
1ppp, unchanged Patient counseled for approximately 5 minutes regarding the health risks of ongoing nicotine use, specifically all types of cancer, heart disease, stroke and respiratory failure. The options available for help with cessation ,the behavioral changes to assist the process, and the option to either gradully reduce usage  Or abruptly stop.is also discussed. Pt is also encouraged to set specific goals in number of cigarettes used daily, as well as to set a quit date.

## 2014-08-15 NOTE — Patient Instructions (Signed)
F/u in 4 month, call if you need me before  T you need to increase dose of antidepressant, new capsule is 75mg  , take one , once daily, If you still have the 37.5 mg dose take one twice daily till done  You are referred to [facility] , and will get info on help for smoking cessation, which is vital for your health  All the best!

## 2014-08-15 NOTE — Progress Notes (Signed)
   Subjective:    Patient ID: [name], female    DOB: 1981-01-25, 34 y.o.   MRN: [id number]  HPI Patient is in for pelvic and breast exam. 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 Single episode of epistaxis, slight, from left nostril several weeks ago, denies trauma to nose first incident    Review of Systems See HPI     Objective:   Physical Exam BP 102/60 mmHg  Pulse 78  Resp 18  Ht 5\' 2"  (1.575 m)  Wt 166 lb (75.297 kg)  BMI 30.35 kg/m2  SpO2 99% Pleasant well nourished female, alert and oriented x 3, in no cardio-pulmonary distress. Afebrile. HEENT No facial trauma or asymetry. Sinuses non tender.  Extra occullar muscles intact, pupils equally reactive to light. External ears normal, tympanic membranes clear. Oropharynx moist, no exudate,poor dentition.Nasal mucosa erythematous,. No site of bleeding seen on exam Neck: supple, no adenopathy,JVD or thyromegaly.No bruits.  Breast: No asymetry,no masses or lumps. No tenderness. No nipple discharge or inversion. No axillary or supraclavicular adenopathy    GU: External genitalia normal female genitalia , female distribution of hair. No lesions. Urethral meatus normal in size, no  Prolapse, no lesions visibly  Present. Bladder non tender. Vagina pink and moist , with no visible lesions ,white  discharge present . Adequate pelvic support no  cystocele or rectocele noted Cervix pink and appears healthy, no lesions or ulcerations noted, no discharge noted from os Uterus normal size, no adnexal masses, no cervical motion or adnexal tenderness.     Psych; Normal mood and affect. Judgement and concentration normal PHQ 9 score of 10        Assessment & Plan:  Annual  physical exam Annual exam as documented. Counseling done  re healthy lifestyle involving commitment to 150 minutes exercise per week, heart healthy diet, and attaining healthy weight.The importance of adequate sleep also discussed. Regular seat belt use and home safety, is also discussed. Changes in health habits are decided on by the patient with goals and time frames  set for achieving them. Immunization and cancer screening needs are specifically addressed at this visit.    Epistaxis Single episode of left sided epistaxis several weeks ago, will refer to ENT   NICOTINE ADDICTION 1ppp, unchanged Patient counseled for approximately 5 minutes regarding the health risks of ongoing nicotine use, specifically all types of cancer, heart disease, stroke and respiratory failure. The options available for help with cessation ,the behavioral changes to assist the process, and the option to either gradully reduce usage  Or abruptly stop.is also discussed. Pt is also encouraged to set specific goals in number of cigarettes used daily, as well as to set a quit date.    Depression, major Dose increase on effexor, pt was inadvertently taking lower dose than prescribed and referral to therapy

## 2014-08-16 LAB — CERVICOVAGINAL ANCILLARY ONLY
WET PREP (BD AFFIRM): NEGATIVE
WET PREP (BD AFFIRM): NEGATIVE
Wet Prep (BD Affirm): POSITIVE — AB

## 2014-08-17 ENCOUNTER — Other Ambulatory Visit: Payer: Self-pay

## 2014-08-17 MED ORDER — METRONIDAZOLE 500 MG PO TABS: 500.0000 mg | ORAL_TABLET | Freq: Two times a day (BID) | ORAL | Status: DC

## 2014-08-18 LAB — CYTOLOGY - PAP

## 2014-08-31 ENCOUNTER — Ambulatory Visit: Payer: Medicare Other

## 2014-09-01 ENCOUNTER — Ambulatory Visit (INDEPENDENT_AMBULATORY_CARE_PROVIDER_SITE_OTHER): Payer: Medicare Other | Admitting: Otolaryngology

## 2014-09-01 ENCOUNTER — Ambulatory Visit: Payer: Medicare Other

## 2014-09-01 ENCOUNTER — Ambulatory Visit (INDEPENDENT_AMBULATORY_CARE_PROVIDER_SITE_OTHER): Payer: Medicare Other | Admitting: *Deleted

## 2014-09-01 ENCOUNTER — Encounter: Payer: Self-pay | Admitting: *Deleted

## 2014-09-01 ENCOUNTER — Other Ambulatory Visit: Payer: Self-pay | Admitting: Adult Health

## 2014-09-01 ENCOUNTER — Telehealth: Payer: Self-pay | Admitting: Adult Health

## 2014-09-01 DIAGNOSIS — Z3042 Encounter for surveillance of injectable contraceptive: Secondary | ICD-10-CM

## 2014-09-01 DIAGNOSIS — Z3202 Encounter for pregnancy test, result negative: Secondary | ICD-10-CM

## 2014-09-01 LAB — POCT URINE PREGNANCY: PREG TEST UR: NEGATIVE

## 2014-09-01 MED ORDER — MEDROXYPROGESTERONE ACETATE 150 MG/ML IM SUSP
150.0000 mg | Freq: Once | INTRAMUSCULAR | Status: AC
  Administered 2014-09-01: 150 mg via INTRAMUSCULAR

## 2014-09-01 NOTE — Progress Notes (Signed)
Pt here for Depo shot. Reports no complaints at this time. Return in 12 weeks for next shot. JSY

## 2014-09-01 NOTE — Telephone Encounter (Signed)
Spoke with pt letting her know Depo rx had been sent through [facility] [place] JSY

## 2014-09-09 ENCOUNTER — Other Ambulatory Visit: Payer: Self-pay

## 2014-09-09 MED ORDER — FAMCICLOVIR 250 MG PO TABS: 250.0000 mg | ORAL_TABLET | Freq: Two times a day (BID) | ORAL | Status: DC

## 2014-09-21 ENCOUNTER — Other Ambulatory Visit: Payer: Self-pay

## 2014-09-21 MED ORDER — MIRTAZAPINE 15 MG PO TABS: 15.0000 mg | ORAL_TABLET | Freq: Every day | ORAL | Status: DC

## 2014-09-26 DIAGNOSIS — G43909 Migraine, unspecified, not intractable, without status migrainosus: Secondary | ICD-10-CM | POA: Diagnosis not present

## 2014-09-26 DIAGNOSIS — Z79899 Other long term (current) drug therapy: Secondary | ICD-10-CM | POA: Diagnosis not present

## 2014-09-26 DIAGNOSIS — G4459 Other complicated headache syndrome: Secondary | ICD-10-CM | POA: Diagnosis not present

## 2014-09-26 DIAGNOSIS — G40919 Epilepsy, unspecified, intractable, without status epilepticus: Secondary | ICD-10-CM | POA: Diagnosis not present

## 2014-11-03 ENCOUNTER — Telehealth: Payer: Self-pay

## 2014-11-03 MED ORDER — FLUCONAZOLE 150 MG PO TABS: 150.0000 mg | ORAL_TABLET | Freq: Once | ORAL | Status: DC

## 2014-11-03 NOTE — Telephone Encounter (Signed)
Patient aware and med sent  

## 2014-11-03 NOTE — Telephone Encounter (Signed)
Please offer and send fluconazole 150 mg one tablet one time only  Number 1 tablet , refill x 1 only. .  This is if patient anticipates or calls in with a complaint of  vaginal itch following recent  (within the past) 1 week) antibiotic use.

## 2014-11-03 NOTE — Addendum Note (Signed)
Addended by: Kandis FantasiaSLADE, Ayyub Krall B on: 11/03/2014 01:24 PM   Modules accepted: Orders

## 2014-11-24 ENCOUNTER — Ambulatory Visit: Payer: Medicare Other

## 2014-11-25 ENCOUNTER — Ambulatory Visit: Payer: Medicare Other

## 2014-11-28 ENCOUNTER — Ambulatory Visit (INDEPENDENT_AMBULATORY_CARE_PROVIDER_SITE_OTHER): Payer: Medicare Other | Admitting: *Deleted

## 2014-11-28 ENCOUNTER — Encounter: Payer: Self-pay | Admitting: *Deleted

## 2014-11-28 DIAGNOSIS — Z3042 Encounter for surveillance of injectable contraceptive: Secondary | ICD-10-CM | POA: Diagnosis not present

## 2014-11-28 DIAGNOSIS — Z3202 Encounter for pregnancy test, result negative: Secondary | ICD-10-CM | POA: Diagnosis not present

## 2014-11-28 LAB — POCT URINE PREGNANCY: PREG TEST UR: NEGATIVE

## 2014-11-28 MED ORDER — MEDROXYPROGESTERONE ACETATE 150 MG/ML IM SUSP
150.0000 mg | Freq: Once | INTRAMUSCULAR | Status: AC
  Administered 2014-11-28: 150 mg via INTRAMUSCULAR

## 2014-11-28 NOTE — Progress Notes (Signed)
Pt here for Depo. Reports no problems at this time. Return in 12 weeks for next shot. JSY 

## 2014-12-14 ENCOUNTER — Encounter: Payer: Self-pay | Admitting: *Deleted

## 2014-12-14 ENCOUNTER — Encounter: Payer: Medicare Other | Admitting: Family Medicine

## 2014-12-16 ENCOUNTER — Emergency Department (HOSPITAL_COMMUNITY)
Admission: EM | Admit: 2014-12-16 | Discharge: 2014-12-16 | Disposition: A | Discharge status: Home Health | Payer: Medicare Other | Attending: Emergency Medicine | Admitting: Emergency Medicine

## 2014-12-16 ENCOUNTER — Encounter (HOSPITAL_COMMUNITY): Payer: Self-pay

## 2014-12-16 DIAGNOSIS — F329 Major depressive disorder, single episode, unspecified: Secondary | ICD-10-CM | POA: Diagnosis not present

## 2014-12-16 DIAGNOSIS — Z79899 Other long term (current) drug therapy: Secondary | ICD-10-CM | POA: Insufficient documentation

## 2014-12-16 DIAGNOSIS — G8929 Other chronic pain: Secondary | ICD-10-CM | POA: Diagnosis not present

## 2014-12-16 DIAGNOSIS — Z8619 Personal history of other infectious and parasitic diseases: Secondary | ICD-10-CM | POA: Insufficient documentation

## 2014-12-16 DIAGNOSIS — Z720 Tobacco use: Secondary | ICD-10-CM | POA: Insufficient documentation

## 2014-12-16 DIAGNOSIS — R569 Unspecified convulsions: Secondary | ICD-10-CM | POA: Diagnosis not present

## 2014-12-16 DIAGNOSIS — G40909 Epilepsy, unspecified, not intractable, without status epilepticus: Secondary | ICD-10-CM | POA: Insufficient documentation

## 2014-12-16 LAB — CBC WITH DIFFERENTIAL/PLATELET
Basophils Absolute: 0 10*3/uL (ref 0.0–0.1)
Basophils Relative: 0 % (ref 0–1)
Eosinophils Absolute: 0 10*3/uL (ref 0.0–0.7)
Eosinophils Relative: 0 % (ref 0–5)
HCT: 40 % (ref 36.0–46.0)
Hemoglobin: 13.4 g/dL (ref 12.0–15.0)
Lymphocytes Relative: 19 % (ref 12–46)
Lymphs Abs: 1.3 10*3/uL (ref 0.7–4.0)
MCH: 32.2 pg (ref 26.0–34.0)
MCHC: 33.5 g/dL (ref 30.0–36.0)
MCV: 96.2 fL (ref 78.0–100.0)
Monocytes Absolute: 0.4 10*3/uL (ref 0.1–1.0)
Monocytes Relative: 6 % (ref 3–12)
Neutro Abs: 5.1 10*3/uL (ref 1.7–7.7)
Neutrophils Relative %: 75 % (ref 43–77)
Platelets: 323 10*3/uL (ref 150–400)
RBC: 4.16 MIL/uL (ref 3.87–5.11)
RDW: 13 % (ref 11.5–15.5)
WBC: 6.8 10*3/uL (ref 4.0–10.5)

## 2014-12-16 LAB — BASIC METABOLIC PANEL
Anion gap: 11 (ref 5–15)
BUN: 6 mg/dL (ref 6–20)
CO2: 20 mmol/L — ABNORMAL LOW (ref 22–32)
Calcium: 8.7 mg/dL — ABNORMAL LOW (ref 8.9–10.3)
Chloride: 102 mmol/L (ref 101–111)
Creatinine, Ser: 0.81 mg/dL (ref 0.44–1.00)
GFR calc Af Amer: >60 mL/min (ref >60)
GFR calc non Af Amer: >60 mL/min (ref >60)
Glucose, Bld: 97 mg/dL (ref 65–99)
Potassium: 3.9 mmol/L (ref 3.5–5.1)
Sodium: 133 mmol/L — ABNORMAL LOW (ref 135–145)

## 2014-12-16 MED ORDER — KETOROLAC TROMETHAMINE 30 MG/ML IJ SOLN
15.0000 mg | Freq: Once | INTRAMUSCULAR | Status: AC
  Administered 2014-12-16: 15 mg via INTRAVENOUS
  Filled 2014-12-16: qty 1

## 2014-12-16 NOTE — Discharge Instructions (Signed)
Driving and Equipment Restrictions °Some medical problems make it dangerous to drive, ride a bike, or use machines. Some of these problems are: °· A hard blow to the head (concussion). °· Passing out (fainting). °· Twitching and shaking (seizures). °· Low blood sugar. °· Taking medicine to help you relax (sedatives). °· Taking pain medicines. °· Wearing an eye patch. °· Wearing splints. This can make it hard to use parts of your body that you need to drive safely. °HOME CARE  °· Do not drive until your doctor says it is okay. °· Do not use machines until your doctor says it is okay. °You may need a form signed by your doctor (medical release) before you can drive again. You may also need this form before you do other tasks where you need to be fully alert. °MAKE SURE YOU: °· Understand these instructions. °· Will watch your condition. °· Will get help right away if you are not doing well or get worse. °Document Released: 08/15/2004 Document Revised: 09/30/2011 Document Reviewed: 11/15/2009 °Exit[employer]® Patient Information ©2015 [employer]. This information is not intended to replace advice given to you by your health care provider. Make sure you discuss any questions you have with your health care provider. ° °Seizure, Adult °A seizure is abnormal electrical activity in the brain. Seizures usually last from 30 seconds to 2 minutes. There are various types of seizures. °Before a seizure, you may have a warning sensation (aura) that a seizure is about to occur. An aura may include the following symptoms:  °· Fear or anxiety. °· Nausea. °· Feeling like the room is spinning (vertigo). °· Vision changes, such as seeing flashing lights or spots. °Common symptoms during a seizure include: °· A change in attention or behavior (altered mental status). °· Convulsions with rhythmic jerking movements. °· Drooling. °· Rapid eye movements. °· Grunting. °· Loss of bladder and bowel control. °· Bitter taste in the mouth. °· Tongue  biting. °After a seizure, you may feel confused and sleepy. You may also have an injury resulting from convulsions during the seizure. °HOME CARE INSTRUCTIONS  °· If you are given medicines, take them exactly as prescribed by your health care provider. °· Keep all follow-up appointments as directed by your health care provider. °· Do not swim or drive or engage in risky activity during which a seizure could cause further injury to you or others until your health care provider says it is OK. °· Get adequate rest. °· Teach friends and family what to do if you have a seizure. They should: °¨ Lay you on the ground to prevent a fall. °¨ Put a cushion under your head. °¨ Loosen any tight clothing around your neck. °¨ Turn you on your side. If vomiting occurs, this helps keep your airway clear. °¨ Stay with you until you recover. °¨ Know whether or not you need emergency care. °SEEK IMMEDIATE MEDICAL CARE IF: °· The seizure lasts longer than 5 minutes. °· The seizure is severe or you do not wake up immediately after the seizure. °· You have an altered mental status after the seizure. °· You are having more frequent or worsening seizures. °Someone should drive you to the emergency department or call local emergency services (911 in [place] °MAKE SURE YOU: °· Understand these instructions. °· Will watch your condition. °· Will get help right away if you are not doing well or get worse. °Document Released: 07/05/2000 Document Revised: 04/28/2013 Document Reviewed: 02/17/2013 °Exit[employer]® Patient Information ©2015 [employer].   This information is not intended to replace advice given to you by your health care provider. Make sure you discuss any questions you have with your health care provider. ° °

## 2014-12-16 NOTE — ED Notes (Signed)
Brought in by EMS. Patient had full body seizure per children lasting several minutes. Complaining of headache since seizure

## 2014-12-16 NOTE — ED Provider Notes (Signed)
CSN: [id number]     Arrival date & time 12/16/14  4098 History   First MD Initiated Contact with Patient 12/16/14 [phone]     Chief Complaint  Patient presents with  . Seizures     (Consider location/radiation/quality/duration/timing/severity/associated sxs/prior Treatment) HPI   34 year old female with seizure. History the same. Apparently witnessed by children. Generalized tonic-clonic activity. Patient is amnestic to the events. Complaining of a mild headache. She did bite her tongue. On Lamictal. She reports compliance with her medication. Poor sleep recently. No fevers or chills. No acute visual complaints. No acute numbness, tingling or loss of strength.  Past Medical History  Diagnosis Date  . Seizures   . Depression   . Back pain   . Genital warts     herpes  . Herpes   . Chronic headaches    Past Surgical History  Procedure Laterality Date  . Cesarean section      x 3   No family history on file. History  Substance Use Topics  . Smoking status: Current Every Day Smoker -- 1.00 packs/day    Types: Cigarettes  . Smokeless tobacco: Never Used  . Alcohol Use: 1.2 oz/week    2 Cans of beer per week     Comment: occasionally   OB History    Gravida Para Term Preterm AB TAB SAB Ectopic Multiple Living   Review of Systems    Allergies  Morphine  Home Medications   Prior to Admission medications   Medication Sig Start Date End Date Taking? Authorizing Provider  famciclovir (FAMVIR) 250 MG tablet Take 1 tablet (250 mg total) by mouth 2 (two) times daily. 09/09/14   Kerri Perches, MD  LamoTRIgine (LAMICTAL XR) 50 MG TB24 Take 1 tablet by mouth daily.    Historical Provider, MD  LamoTRIgine 250 MG TB24 Takes 2 tabs at bedtime. 08/11/14   Historical Provider, MD  medroxyPROGESTERone (DEPO-PROVERA) 150 MG/ML injection INJECT 1 ML IN THE MUSCLE ONCE EVERY 3 MONTHS AS DIRECTED 09/01/14   Adline Potter, NP  mirtazapine (REMERON) 15 MG tablet  Take 1 tablet (15 mg total) by mouth at bedtime. 09/21/14 09/21/15  Kerri Perches, MD  naproxen (NAPROSYN) 500 MG tablet Take 500 mg by mouth 2 (two) times daily with a meal.    Historical Provider, MD  rizatriptan (MAXALT) 10 MG tablet Take 10 mg by mouth as needed for migraine. May repeat in 2 hours if needed    Historical Provider, MD  venlafaxine XR (EFFEXOR-XR) 75 MG 24 hr capsule Take 1 capsule (75 mg total) by mouth daily with breakfast. 08/15/14   Kerri Perches, MD   There were no vitals taken for this visit. Physical Exam  Constitutional: She is oriented to person, place, and time. She appears well-developed and well-nourished. No distress.  HENT:  Head: Normocephalic and atraumatic.  Abraded appearance to lateral aspect b/l tongue. No active bleeding. No evidence of oral trauma otherwise.  Eyes: Conjunctivae are normal. Right eye exhibits no discharge. Left eye exhibits no discharge.  Neck: Neck supple.  No nuchal rigidity  Cardiovascular: Normal rate, regular rhythm and normal heart sounds.  Exam reveals no gallop and no friction rub.   No murmur heard. Pulmonary/Chest: Effort normal and breath sounds normal. No respiratory distress.  Abdominal: Soft. She exhibits no distension. There is no tenderness.  Musculoskeletal: She exhibits no edema or tenderness.  Neurological: She  is alert and oriented to person, place, and time. No cranial nerve deficit. She exhibits normal muscle tone. Coordination normal.  Skin: Skin is warm and dry.  Psychiatric: She has a normal mood and affect. Her behavior is normal. Thought content normal.  Nursing note and vitals reviewed.   ED Course  Procedures (including critical care time) Labs Review Labs Reviewed  BASIC METABOLIC PANEL - Abnormal; Notable for the following:    Sodium 133 (*)    CO2 20 (*)    Calcium 8.7 (*)    All other components within normal limits  CBC WITH DIFFERENTIAL/PLATELET  LAMOTRIGINE LEVEL    Imaging  Review No results found.   EKG Interpretation None      MDM   Final diagnoses:  Seizure   33yf with seizure. Hx of the same. Back to baseline. Nonfocal neuro examination. Will check lamictal level. Will not be resulted today but might aid follow-up provider. It has been determined that no acute conditions requiring further emergency intervention are present at this time. The patient has been advised of the diagnosis and plan. I reviewed any labs and imaging including any potential incidental findings. We have discussed signs and symptoms that warrant return to the ED and they are listed in the discharge instructions.      Raeford RazorStephen Derika Eckles, MD 12/22/14 [phone]

## 2014-12-21 LAB — LAMOTRIGINE LEVEL: Lamotrigine Lvl: 4.1 ug/mL (ref 2.0–20.0)

## 2014-12-26 ENCOUNTER — Emergency Department (HOSPITAL_COMMUNITY): Payer: Medicare Other

## 2014-12-26 ENCOUNTER — Emergency Department (HOSPITAL_COMMUNITY)
Admission: EM | Admit: 2014-12-26 | Discharge: 2014-12-26 | Disposition: A | Discharge status: Home / Self Care | Payer: Medicare Other | Attending: Emergency Medicine | Admitting: Emergency Medicine

## 2014-12-26 ENCOUNTER — Other Ambulatory Visit: Payer: Self-pay

## 2014-12-26 ENCOUNTER — Encounter (HOSPITAL_COMMUNITY): Payer: Self-pay | Admitting: *Deleted

## 2014-12-26 DIAGNOSIS — R079 Chest pain, unspecified: Secondary | ICD-10-CM

## 2014-12-26 DIAGNOSIS — R091 Pleurisy: Secondary | ICD-10-CM | POA: Insufficient documentation

## 2014-12-26 DIAGNOSIS — G4459 Other complicated headache syndrome: Secondary | ICD-10-CM | POA: Diagnosis not present

## 2014-12-26 DIAGNOSIS — F3289 Other specified depressive episodes: Secondary | ICD-10-CM

## 2014-12-26 DIAGNOSIS — Z720 Tobacco use: Secondary | ICD-10-CM | POA: Insufficient documentation

## 2014-12-26 DIAGNOSIS — Z79899 Other long term (current) drug therapy: Secondary | ICD-10-CM | POA: Insufficient documentation

## 2014-12-26 DIAGNOSIS — Z8619 Personal history of other infectious and parasitic diseases: Secondary | ICD-10-CM | POA: Diagnosis not present

## 2014-12-26 DIAGNOSIS — G40919 Epilepsy, unspecified, intractable, without status epilepticus: Secondary | ICD-10-CM | POA: Diagnosis not present

## 2014-12-26 DIAGNOSIS — G40909 Epilepsy, unspecified, not intractable, without status epilepticus: Secondary | ICD-10-CM | POA: Insufficient documentation

## 2014-12-26 DIAGNOSIS — F329 Major depressive disorder, single episode, unspecified: Secondary | ICD-10-CM | POA: Diagnosis not present

## 2014-12-26 MED ORDER — IBUPROFEN 800 MG PO TABS: 800.0000 mg | ORAL_TABLET | Freq: Three times a day (TID) | ORAL | Status: DC

## 2014-12-26 MED ORDER — KETOROLAC TROMETHAMINE 60 MG/2ML IM SOLN
60.0000 mg | Freq: Once | INTRAMUSCULAR | Status: AC
  Administered 2014-12-26: 60 mg via INTRAMUSCULAR
  Filled 2014-12-26: qty 2

## 2014-12-26 MED ORDER — VENLAFAXINE HCL ER 75 MG PO CP24: 75.0000 mg | ORAL_CAPSULE | Freq: Every day | ORAL | Status: DC

## 2014-12-26 NOTE — Discharge Instructions (Signed)
Please call your doctor for a followup appointment within 24-48 hours. When you talk to your doctor please let them know that you were seen in the emergency department and have them acquire all of your records so that they can discuss the findings with you and formulate a treatment plan to fully care for your new and ongoing problems. ° °

## 2014-12-26 NOTE — ED Notes (Signed)
Pt alert & oriented x4, stable gait. Patient given discharge instructions, paperwork & prescription(s). Patient  instructed to stop at the registration desk to finish any additional paperwork. Patient verbalized understanding. Pt left department w/ no further questions. 

## 2014-12-26 NOTE — ED Provider Notes (Signed)
CSN: [id number]     Arrival date & time 12/26/14  2025 History   First MD Initiated Contact with Patient 12/26/14 2032     Chief Complaint  Patient presents with  . Chest Pain     (Consider location/radiation/quality/duration/timing/severity/associated sxs/prior Treatment) HPI Comments: Pt states that she had acute onset of pain that occurred last night when she was at home - it has been intermittent - she feels it is a sharp pain when she takes deep breaths or bends forward / twists with her torso.  She has no coughing, no fevers, no swelling of the legs and uses no meds except for her anti seizure medicines.  She has no hx of DVT / PE and no hx of recent viral infections / URI's.  She has been smoking MJ last night.  She also smokes cigarettes.  No meds pta.  Patient is a 34 y.o. female presenting with chest pain. The history is provided by the patient.  Chest Pain   Past Medical History  Diagnosis Date  . Seizures   . Depression   . Back pain   . Genital warts     herpes  . Herpes   . Chronic headaches    Past Surgical History  Procedure Laterality Date  . Cesarean section      x 3   History reviewed. No pertinent family history. History  Substance Use Topics  . Smoking status: Current Every Day Smoker -- 1.00 packs/day    Types: Cigarettes  . Smokeless tobacco: Never Used  . Alcohol Use: 1.2 oz/week    2 Cans of beer per week     Comment: occasionally   OB History    Gravida Para Term Preterm AB TAB SAB Ectopic Multiple Living   3 3        2      Review of Systems  Cardiovascular: Positive for chest pain.  All other systems reviewed and are negative.     Allergies  Morphine  Home Medications   Prior to Admission medications   Medication Sig Start Date End Date Taking? Authorizing Provider  famciclovir (FAMVIR) 250 MG tablet Take 1 tablet (250 mg total) by mouth 2 (two) times daily. 09/09/14   Kerri PerchesMargaret E Simpson, MD  HYDROcodone-acetaminophen (NORCO)  7.5-325 MG per tablet Take 1 tablet by mouth daily as needed for moderate pain.  11/28/14   Historical Provider, MD  ibuprofen (ADVIL,MOTRIN) 800 MG tablet Take 1 tablet (800 mg total) by mouth 3 (three) times daily. 12/26/14   Eber HongBrian Dann Ventress, MD  LamoTRIgine 250 MG TB24 Takes 2 tabs at bedtime. 08/11/14   Historical Provider, MD  medroxyPROGESTERone (DEPO-PROVERA) 150 MG/ML injection INJECT 1 ML IN THE MUSCLE ONCE EVERY 3 MONTHS AS DIRECTED 09/01/14   Adline PotterJennifer A Griffin, NP  methocarbamol (ROBAXIN) 500 MG tablet Take 1 tablet by mouth 2 (two) times daily as needed for muscle spasms.  09/26/14   Historical Provider, MD  mirtazapine (REMERON) 15 MG tablet Take 1 tablet (15 mg total) by mouth at bedtime. 09/21/14 09/21/15  Kerri PerchesMargaret E Simpson, MD  Perampanel 8 MG TABS Take 1 tablet by mouth at bedtime.    Historical Provider, MD  rizatriptan (MAXALT) 10 MG tablet Take 10 mg by mouth as needed for migraine. May repeat in 2 hours if needed    Historical Provider, MD  venlafaxine XR (EFFEXOR-XR) 75 MG 24 hr capsule Take 1 capsule (75 mg total) by mouth daily with breakfast. 12/26/14   Kerri PerchesMargaret E Simpson,  MD   BP 127/86 mmHg  Pulse 74  Temp(Src) 98.2 F (36.8 C) (Oral)  Resp 18  Ht  (1.549 m)  Wt 146 lb (66.225 kg)  BMI 27.60 kg/m2  SpO2 99% Physical Exam  Constitutional: She appears well-developed and well-nourished. No distress.  HENT:  Head: Normocephalic and atraumatic.  Mouth/Throat: Oropharynx is clear and moist. No oropharyngeal exudate.  Eyes: Conjunctivae and EOM are normal. Pupils are equal, round, and reactive to light. Right eye exhibits no discharge. Left eye exhibits no discharge. No scleral icterus.  Neck: Normal range of motion. Neck supple. No JVD present. No thyromegaly present.  Cardiovascular: Normal rate, regular rhythm, normal heart sounds and intact distal pulses.  Exam reveals no gallop and no friction rub.   No murmur heard. Pulmonary/Chest: Effort normal and breath sounds normal.  No respiratory distress. She has no wheezes. She has no rales. She exhibits no tenderness.  No reproducible chest ttp.  Abdominal: Soft. Bowel sounds are normal. She exhibits no distension and no mass. There is no tenderness.  Musculoskeletal: Normal range of motion. She exhibits no edema or tenderness.  Lymphadenopathy:    She has no cervical adenopathy.  Neurological: She is alert. Coordination normal.  Skin: Skin is warm and dry. No rash noted. No erythema.  Psychiatric: She has a normal mood and affect. Her behavior is normal.  Nursing note and vitals reviewed.   ED Course  Procedures (including critical care time) Labs Review Labs Reviewed - No data to display  Imaging Review Dg Chest 2 View  12/26/2014   CLINICAL DATA:  Chest pain radiating to the entire tests and upper back.  EXAM: CHEST  2 VIEW  COMPARISON:  06/05/2009  FINDINGS: The heart size and mediastinal contours are within normal limits. Both lungs are clear. The visualized skeletal structures are unremarkable.  IMPRESSION: No active cardiopulmonary disease.   Electronically Signed   By: Elige Ko   On: 12/26/2014 21:11     EKG Interpretation   Date/Time:  Monday December 26 2014 20:32:50 EDT Ventricular Rate:  80 PR Interval:  146 QRS Duration: 85 QT Interval:  340 QTC Calculation: 392 R Axis:   71 Text Interpretation:  Sinus rhythm RSR' in V1 or V2, probably normal  variant Borderline T abnormalities, anterior leads since last tracing no  significant change Confirmed by Keila Turan  MD, Stpehanie Montroy ([id number]) on 12/26/2014  8:52:54 PM      MDM   Final diagnoses:  Pleurisy    The pt has normal heart and lung sounds, her ECG is unremarkable and unchanged from prior - she had ECG about 9 days ago when she was seen for seizures.  CXR to r/o PTX / PNA.  Pt in agreement - does not sound cardiac in nature.  I have personally seen and interpreted the 2 view PA and lateral chest x-ray, there is no signs of infiltrates,  pneumothorax, mediastinal abnormalities or bony abnormalities or soft tissue abnormalities. The patient has normal vital signs, she is given her results, she'll be treated with ibuprofen and sent home to follow-up in 1 week if symptoms continue. Likely pleurisy. Doubt more significant diagnoses such as pulmonary embolism, aortic dissection or acute coronary syndrome given unremarkable presentation, the nature of her pain and her unchanged EKG.  Filed Vitals:   12/26/14 2031 12/26/14 2036  BP:  127/86  Pulse:  74  Temp:  98.2 F (36.8 C)  TempSrc:  Oral  Resp:  18  Height:  (  1.549 m)   Weight: 146 lb (66.225 kg)   SpO2:  99%     Meds given in ED:  Medications  ketorolac (TORADOL) injection 60 mg (60 mg Intramuscular Given 12/26/14 2109)       Eber Hong, MD 12/26/14 2132

## 2014-12-26 NOTE — ED Notes (Signed)
Pt c/o chest pain that started last night; pt states the pain is worse with movement and radiates around to her back

## 2014-12-30 DIAGNOSIS — Z23 Encounter for immunization: Secondary | ICD-10-CM | POA: Insufficient documentation

## 2014-12-30 NOTE — Assessment & Plan Note (Signed)
Topical antifungal twice daily for 1 week, then as needed

## 2014-12-30 NOTE — Assessment & Plan Note (Signed)
After obtaining informed consent, the vaccine is  administered by LPN.  

## 2014-12-30 NOTE — Assessment & Plan Note (Signed)
Start effexor, encouraged to go to counseling wants to hold off on this Not suicidal or homicidal

## 2014-12-30 NOTE — Assessment & Plan Note (Signed)

## 2015-02-20 ENCOUNTER — Ambulatory Visit (INDEPENDENT_AMBULATORY_CARE_PROVIDER_SITE_OTHER): Payer: Medicare Other | Admitting: *Deleted

## 2015-02-20 ENCOUNTER — Ambulatory Visit: Payer: Medicare Other

## 2015-02-20 DIAGNOSIS — Z3202 Encounter for pregnancy test, result negative: Secondary | ICD-10-CM

## 2015-02-20 DIAGNOSIS — Z3042 Encounter for surveillance of injectable contraceptive: Secondary | ICD-10-CM

## 2015-02-20 LAB — POCT URINE PREGNANCY: PREG TEST UR: NEGATIVE

## 2015-02-20 MED ORDER — MEDROXYPROGESTERONE ACETATE 150 MG/ML IM SUSP
150.0000 mg | Freq: Once | INTRAMUSCULAR | Status: AC
  Administered 2015-02-20: 150 mg via INTRAMUSCULAR

## 2015-02-20 NOTE — Progress Notes (Signed)
Patient ID: [name], female   DOB: 1980-12-23, 34 y.o.   MRN: [id number] [place] was here today for Depo Provera injection.  Pt reports no problems at this time and UPT was negative.  Injection given in Rt deltoid with no complications.

## 2015-03-23 DIAGNOSIS — F329 Major depressive disorder, single episode, unspecified: Secondary | ICD-10-CM | POA: Diagnosis not present

## 2015-03-23 DIAGNOSIS — G40919 Epilepsy, unspecified, intractable, without status epilepticus: Secondary | ICD-10-CM | POA: Diagnosis not present

## 2015-03-23 DIAGNOSIS — Z79899 Other long term (current) drug therapy: Secondary | ICD-10-CM | POA: Diagnosis not present

## 2015-03-23 DIAGNOSIS — G4459 Other complicated headache syndrome: Secondary | ICD-10-CM | POA: Diagnosis not present

## 2015-03-28 ENCOUNTER — Telehealth: Payer: Self-pay | Admitting: *Deleted

## 2015-03-28 ENCOUNTER — Other Ambulatory Visit: Payer: Self-pay

## 2015-03-28 MED ORDER — FAMCICLOVIR 250 MG PO TABS: 250.0000 mg | ORAL_TABLET | Freq: Two times a day (BID) | ORAL | Status: DC

## 2015-03-28 NOTE — Telephone Encounter (Signed)
Boris Lown called back stating her ext [phone] and pt needs to go to [facility] [phone] [place] for Epilepsy  [phone].

## 2015-03-28 NOTE — Telephone Encounter (Signed)
Boris Lown called [phone] ext [phone] [facility] stating Dr. Gerilyn Pilgrim wants to refer pt to Dr. Lisbeth Renshaw a neuro surgeon and patient has [facility] Dr. Ronal Fear NPI # is [id number]. Please advise [name] when and if referral can be done.

## 2015-03-28 NOTE — Telephone Encounter (Signed)
I spoke to [name] and she stated that right now she has chosen to hold off, she will call back and let us know if she decides to go to [facility].

## 2015-03-30 NOTE — Telephone Encounter (Signed)
Noted  

## 2015-05-15 ENCOUNTER — Ambulatory Visit: Payer: Medicare Other

## 2015-05-16 ENCOUNTER — Ambulatory Visit: Payer: Medicare Other

## 2015-05-22 ENCOUNTER — Ambulatory Visit (INDEPENDENT_AMBULATORY_CARE_PROVIDER_SITE_OTHER): Payer: Medicare Other | Admitting: *Deleted

## 2015-05-22 ENCOUNTER — Encounter: Payer: Self-pay | Admitting: *Deleted

## 2015-05-22 DIAGNOSIS — Z3042 Encounter for surveillance of injectable contraceptive: Secondary | ICD-10-CM | POA: Diagnosis not present

## 2015-05-22 DIAGNOSIS — Z3200 Encounter for pregnancy test, result unknown: Secondary | ICD-10-CM

## 2015-05-22 DIAGNOSIS — Z3202 Encounter for pregnancy test, result negative: Secondary | ICD-10-CM

## 2015-05-22 LAB — POCT URINE PREGNANCY: PREG TEST UR: NEGATIVE

## 2015-05-22 MED ORDER — MEDROXYPROGESTERONE ACETATE 150 MG/ML IM SUSP
150.0000 mg | Freq: Once | INTRAMUSCULAR | Status: AC
  Administered 2015-05-22: 150 mg via INTRAMUSCULAR

## 2015-05-22 NOTE — Progress Notes (Signed)
Patient ID: [name], female   DOB: 1981/03/13, 34 y.o.   MRN: [id number] Pt her today for DEPO injection. Pt given DEPO and tolerated well.

## 2015-05-26 ENCOUNTER — Telehealth: Payer: Self-pay | Admitting: Family Medicine

## 2015-05-26 NOTE — Telephone Encounter (Signed)
Patient is calling asking for a refill on clotrimazole-betamethasone (LOTRISONE) cream please advise ?

## 2015-05-29 ENCOUNTER — Other Ambulatory Visit: Payer: Self-pay

## 2015-05-29 MED ORDER — CLOTRIMAZOLE-BETAMETHASONE 1-0.05 % EX CREA: 1.0000 "application " | TOPICAL_CREAM | Freq: Two times a day (BID) | CUTANEOUS | Status: DC

## 2015-05-29 NOTE — Telephone Encounter (Signed)
Medication refilled

## 2015-06-05 ENCOUNTER — Telehealth: Payer: Self-pay | Admitting: Family Medicine

## 2015-06-05 NOTE — Telephone Encounter (Signed)
Patient states that she thinks this is a different Rx than what she got before, she is stating that this cream is $60 and she didn't pay that before, please advise?

## 2015-06-05 NOTE — Telephone Encounter (Signed)
Attempted to PA the cream. Will await results

## 2015-06-08 ENCOUNTER — Ambulatory Visit: Payer: Medicare Other | Admitting: Family Medicine

## 2015-06-08 ENCOUNTER — Encounter: Payer: Self-pay | Admitting: Family Medicine

## 2015-06-21 DIAGNOSIS — G43909 Migraine, unspecified, not intractable, without status migrainosus: Secondary | ICD-10-CM | POA: Diagnosis not present

## 2015-06-21 DIAGNOSIS — Z79899 Other long term (current) drug therapy: Secondary | ICD-10-CM | POA: Diagnosis not present

## 2015-06-21 DIAGNOSIS — G40919 Epilepsy, unspecified, intractable, without status epilepticus: Secondary | ICD-10-CM | POA: Diagnosis not present

## 2015-06-21 DIAGNOSIS — G4459 Other complicated headache syndrome: Secondary | ICD-10-CM | POA: Diagnosis not present

## 2015-06-21 DIAGNOSIS — R413 Other amnesia: Secondary | ICD-10-CM | POA: Diagnosis not present

## 2015-06-21 DIAGNOSIS — F17200 Nicotine dependence, unspecified, uncomplicated: Secondary | ICD-10-CM | POA: Diagnosis not present

## 2015-08-15 ENCOUNTER — Ambulatory Visit: Payer: Medicare Other

## 2015-08-15 ENCOUNTER — Other Ambulatory Visit: Payer: Self-pay | Admitting: Adult Health

## 2015-08-17 ENCOUNTER — Ambulatory Visit (INDEPENDENT_AMBULATORY_CARE_PROVIDER_SITE_OTHER): Payer: Medicare Other | Admitting: *Deleted

## 2015-08-17 ENCOUNTER — Encounter: Payer: Self-pay | Admitting: *Deleted

## 2015-08-17 DIAGNOSIS — Z3042 Encounter for surveillance of injectable contraceptive: Secondary | ICD-10-CM

## 2015-08-17 DIAGNOSIS — Z3202 Encounter for pregnancy test, result negative: Secondary | ICD-10-CM

## 2015-08-17 LAB — POCT URINE PREGNANCY: Preg Test, Ur: NEGATIVE

## 2015-08-17 MED ORDER — MEDROXYPROGESTERONE ACETATE 150 MG/ML IM SUSP
150.0000 mg | Freq: Once | INTRAMUSCULAR | Status: AC
  Administered 2015-08-17: 150 mg via INTRAMUSCULAR

## 2015-08-17 NOTE — Progress Notes (Signed)
Pt here for Depo. Reports no problems at this time. Return in 12 weeks for next shot. JSY 

## 2015-09-05 ENCOUNTER — Encounter (HOSPITAL_COMMUNITY): Payer: Self-pay | Admitting: Emergency Medicine

## 2015-09-05 ENCOUNTER — Emergency Department (HOSPITAL_COMMUNITY)
Admission: EM | Admit: 2015-09-05 | Discharge: 2015-09-05 | Disposition: A | Discharge status: Home / Self Care | Payer: Medicare Other | Attending: Emergency Medicine | Admitting: Emergency Medicine

## 2015-09-05 DIAGNOSIS — R569 Unspecified convulsions: Secondary | ICD-10-CM | POA: Diagnosis not present

## 2015-09-05 DIAGNOSIS — G8929 Other chronic pain: Secondary | ICD-10-CM | POA: Insufficient documentation

## 2015-09-05 DIAGNOSIS — Z79899 Other long term (current) drug therapy: Secondary | ICD-10-CM | POA: Insufficient documentation

## 2015-09-05 DIAGNOSIS — Z8619 Personal history of other infectious and parasitic diseases: Secondary | ICD-10-CM | POA: Insufficient documentation

## 2015-09-05 DIAGNOSIS — Z791 Long term (current) use of non-steroidal anti-inflammatories (NSAID): Secondary | ICD-10-CM | POA: Insufficient documentation

## 2015-09-05 DIAGNOSIS — F17210 Nicotine dependence, cigarettes, uncomplicated: Secondary | ICD-10-CM | POA: Insufficient documentation

## 2015-09-05 DIAGNOSIS — F329 Major depressive disorder, single episode, unspecified: Secondary | ICD-10-CM | POA: Diagnosis not present

## 2015-09-05 DIAGNOSIS — R519 Headache, unspecified: Secondary | ICD-10-CM

## 2015-09-05 DIAGNOSIS — R51 Headache: Secondary | ICD-10-CM | POA: Insufficient documentation

## 2015-09-05 LAB — CBC WITH DIFFERENTIAL/PLATELET
Basophils Absolute: 0 10*3/uL (ref 0.0–0.1)
Basophils Relative: 0 %
EOS ABS: 0 10*3/uL (ref 0.0–0.7)
EOS PCT: 1 %
HCT: 43.9 % (ref 36.0–46.0)
HEMOGLOBIN: 14.1 g/dL (ref 12.0–15.0)
LYMPHS ABS: 2 10*3/uL (ref 0.7–4.0)
Lymphocytes Relative: 37 %
MCH: 31.2 pg (ref 26.0–34.0)
MCHC: 32.1 g/dL (ref 30.0–36.0)
MCV: 97.1 fL (ref 78.0–100.0)
MONOS PCT: 7 %
Monocytes Absolute: 0.4 10*3/uL (ref 0.1–1.0)
Neutro Abs: 2.9 10*3/uL (ref 1.7–7.7)
Neutrophils Relative %: 55 %
PLATELETS: 356 10*3/uL (ref 150–400)
RBC: 4.52 MIL/uL (ref 3.87–5.11)
RDW: 13.7 % (ref 11.5–15.5)
WBC: 5.3 10*3/uL (ref 4.0–10.5)

## 2015-09-05 LAB — COMPREHENSIVE METABOLIC PANEL
ALK PHOS: 65 U/L (ref 38–126)
ALT: 14 U/L (ref 14–54)
ANION GAP: 10 (ref 5–15)
AST: 15 U/L (ref 15–41)
Albumin: 4.4 g/dL (ref 3.5–5.0)
BUN: 6 mg/dL (ref 6–20)
CALCIUM: 9.2 mg/dL (ref 8.9–10.3)
CO2: 25 mmol/L (ref 22–32)
Chloride: 104 mmol/L (ref 101–111)
Creatinine, Ser: 0.85 mg/dL (ref 0.44–1.00)
GFR calc non Af Amer: >60 mL/min (ref >60)
Glucose, Bld: 93 mg/dL (ref 65–99)
Potassium: 4.1 mmol/L (ref 3.5–5.1)
Sodium: 139 mmol/L (ref 135–145)
Total Bilirubin: 0.7 mg/dL (ref 0.3–1.2)
Total Protein: 7.9 g/dL (ref 6.5–8.1)

## 2015-09-05 MED ORDER — METOCLOPRAMIDE HCL 5 MG/ML IJ SOLN
10.0000 mg | Freq: Once | INTRAMUSCULAR | Status: AC
  Administered 2015-09-05: 10 mg via INTRAVENOUS
  Filled 2015-09-05: qty 2

## 2015-09-05 MED ORDER — DIPHENHYDRAMINE HCL 50 MG/ML IJ SOLN
25.0000 mg | Freq: Once | INTRAMUSCULAR | Status: AC
  Administered 2015-09-05: 25 mg via INTRAVENOUS
  Filled 2015-09-05: qty 1

## 2015-09-05 MED ORDER — KETOROLAC TROMETHAMINE 30 MG/ML IJ SOLN
30.0000 mg | Freq: Once | INTRAMUSCULAR | Status: AC
  Administered 2015-09-05: 30 mg via INTRAVENOUS
  Filled 2015-09-05: qty 1

## 2015-09-05 MED ORDER — LAMOTRIGINE 100 MG PO TABS
500.0000 mg | ORAL_TABLET | Freq: Once | ORAL | Status: DC
  Filled 2015-09-05: qty 5

## 2015-09-05 MED ORDER — LAMOTRIGINE 200 MG PO TABS
500.0000 mg | ORAL_TABLET | Freq: Once | ORAL | Status: DC
  Administered 2015-09-05: 500 mg via ORAL
  Filled 2015-09-05: qty 1

## 2015-09-05 NOTE — ED Notes (Signed)
MD Zammit at bedside updating patient and family.  

## 2015-09-05 NOTE — ED Notes (Signed)
Patient given another cup of water.  Patient ask that IV be removed so she could be discharged.

## 2015-09-05 NOTE — Discharge Instructions (Signed)
Follow-up with her family doctor if persistent headaches

## 2015-09-05 NOTE — ED Provider Notes (Signed)
CSN: [id number]     Arrival date & time 09/05/15  1323 History   First MD Initiated Contact with Patient 09/05/15 1521     Chief Complaint  Patient presents with  . Seizures     (Consider location/radiation/quality/duration/timing/severity/associated sxs/prior Treatment) Patient is a 35 y.o. female presenting with headaches. The history is provided by the patient (Patient states that she awoke with a headache today. She states she may have had a seizure could she has headaches after a seizure. She ran out of her seizure medicine yesterday).  Headache Pain location:  Frontal Quality:  Dull Radiates to:  Does not radiate Severity currently:  7/10 Severity at highest:  8/10 Onset quality:  Sudden Timing:  Constant Progression:  Waxing and waning Chronicity:  Recurrent Context: not activity   Associated symptoms: seizures   Associated symptoms: no abdominal pain, no back pain, no congestion, no cough, no diarrhea, no fatigue and no sinus pressure     Past Medical History  Diagnosis Date  . Seizures (HCC)   . Depression   . Back pain   . Genital warts     herpes  . Herpes   . Chronic headaches    Past Surgical History  Procedure Laterality Date  . Cesarean section      x 3   History reviewed. No pertinent family history. Social History  Substance Use Topics  . Smoking status: Current Every Day Smoker -- 1.00 packs/day    Types: Cigarettes  . Smokeless tobacco: Never Used  . Alcohol Use: 1.2 oz/week    2 Cans of beer per week     Comment: occasionally   OB History    Gravida Para Term Preterm AB TAB SAB Ectopic Multiple Living   Review of Systems  Constitutional: Negative for appetite change and fatigue.  HENT: Negative for congestion, ear discharge and sinus pressure.   Eyes: Negative for discharge.  Respiratory: Negative for cough.   Cardiovascular: Negative for chest pain.  Gastrointestinal: Negative for abdominal pain and diarrhea.   Genitourinary: Negative for frequency and hematuria.  Musculoskeletal: Negative for back pain.  Skin: Negative for rash.  Neurological: Positive for seizures and headaches.  Psychiatric/Behavioral: Negative for hallucinations.      Allergies  Morphine  Home Medications   Prior to Admission medications   Medication Sig Start Date End Date Taking? Authorizing Provider  famciclovir (FAMVIR) 250 MG tablet Take 1 tablet (250 mg total) by mouth 2 (two) times daily. 03/28/15   Kerri Perches, MD  HYDROcodone-acetaminophen (NORCO) 7.5-325 MG per tablet Take 1 tablet by mouth daily as needed for moderate pain.  11/28/14   Historical Provider, MD  ibuprofen (ADVIL,MOTRIN) 800 MG tablet Take 1 tablet (800 mg total) by mouth 3 (three) times daily. 12/26/14   Eber Hong, MD  LamoTRIgine 250 MG TB24 Takes 2 tabs at bedtime. 08/11/14   Historical Provider, MD  medroxyPROGESTERone (DEPO-PROVERA) 150 MG/ML injection INJECT 1 ML IN THE MUSCLE ONCE EVERY 3 MONTHS AS DIRECTED 08/15/15   Adline Potter, NP  methocarbamol (ROBAXIN) 500 MG tablet Take 1 tablet by mouth 2 (two) times daily as needed for muscle spasms.  09/26/14   Historical Provider, MD  mirtazapine (REMERON) 15 MG tablet Take 1 tablet (15 mg total) by mouth at bedtime. 09/21/14 09/21/15  Kerri Perches, MD  rizatriptan (MAXALT) 10 MG tablet Take 10 mg by mouth as needed for migraine.  May repeat in 2 hours if needed    Historical Provider, MD  venlafaxine XR (EFFEXOR-XR) 75 MG 24 hr capsule Take 1 capsule (75 mg total) by mouth daily with breakfast. 12/26/14   Kerri Perches, MD   BP 125/84 mmHg  Pulse 85  Temp(Src) 98.6 F (37 C) (Oral)  Resp 20  Ht  (1.549 m)  Wt 160 lb (72.576 kg)  BMI 30.25 kg/m2  SpO2 100% Physical Exam  Constitutional: She is oriented to person, place, and time. She appears well-developed.  HENT:  Head: Normocephalic.  Eyes: Conjunctivae and EOM are normal. No scleral icterus.  Neck: Neck supple. No  thyromegaly present.  Cardiovascular: Normal rate and regular rhythm.  Exam reveals no gallop and no friction rub.   No murmur heard. Pulmonary/Chest: No stridor. She has no wheezes. She has no rales. She exhibits no tenderness.  Abdominal: She exhibits no distension. There is no tenderness. There is no rebound.  Musculoskeletal: Normal range of motion. She exhibits no edema.  Lymphadenopathy:    She has no cervical adenopathy.  Neurological: She is oriented to person, place, and time. She exhibits normal muscle tone. Coordination normal.  Skin: No rash noted. No erythema.  Psychiatric: She has a normal mood and affect. Her behavior is normal.    ED Course  Procedures (including critical care time) Labs Review Labs Reviewed  CBC WITH DIFFERENTIAL/PLATELET  COMPREHENSIVE METABOLIC PANEL    Imaging Review No results found. I have personally reviewed and evaluated these images and lab results as part of my medical decision-making.   EKG Interpretation None      MDM   Final diagnoses:  Headache around the eyes    Patient headache improved with treatment. She was also given a dose of her seizure medicine and her mother filled her prescription    Bethann Berkshire, MD 09/05/15 [phone]

## 2015-09-05 NOTE — ED Notes (Signed)
PT c/o waking up this morning with headache and took 2 tablets of Rizatriptan with no relief. PT states  she may have had a seizure and is out of her Lamictal medication x1 day. PT states headache with light and noise sensitive.

## 2015-09-13 DIAGNOSIS — Z79899 Other long term (current) drug therapy: Secondary | ICD-10-CM | POA: Diagnosis not present

## 2015-09-13 DIAGNOSIS — G4459 Other complicated headache syndrome: Secondary | ICD-10-CM | POA: Diagnosis not present

## 2015-09-13 DIAGNOSIS — F17200 Nicotine dependence, unspecified, uncomplicated: Secondary | ICD-10-CM | POA: Diagnosis not present

## 2015-09-13 DIAGNOSIS — G43909 Migraine, unspecified, not intractable, without status migrainosus: Secondary | ICD-10-CM | POA: Diagnosis not present

## 2015-09-13 DIAGNOSIS — G40919 Epilepsy, unspecified, intractable, without status epilepticus: Secondary | ICD-10-CM | POA: Diagnosis not present

## 2015-09-20 ENCOUNTER — Ambulatory Visit (INDEPENDENT_AMBULATORY_CARE_PROVIDER_SITE_OTHER): Payer: Medicare Other | Admitting: Family Medicine

## 2015-09-20 ENCOUNTER — Encounter: Payer: Self-pay | Admitting: Family Medicine

## 2015-09-20 VITALS — BP 116/62 | HR 76 | Resp 18 | Ht 61.0 in | Wt 166.0 lb

## 2015-09-20 DIAGNOSIS — E669 Obesity, unspecified: Secondary | ICD-10-CM

## 2015-09-20 DIAGNOSIS — F17200 Nicotine dependence, unspecified, uncomplicated: Secondary | ICD-10-CM

## 2015-09-20 DIAGNOSIS — B369 Superficial mycosis, unspecified: Secondary | ICD-10-CM

## 2015-09-20 DIAGNOSIS — Z23 Encounter for immunization: Secondary | ICD-10-CM

## 2015-09-20 DIAGNOSIS — F331 Major depressive disorder, recurrent, moderate: Secondary | ICD-10-CM

## 2015-09-20 MED ORDER — TERBINAFINE HCL 250 MG PO TABS: 250.0000 mg | ORAL_TABLET | Freq: Every day | ORAL | Status: DC

## 2015-09-20 MED ORDER — MIRTAZAPINE 30 MG PO TABS: 30.0000 mg | ORAL_TABLET | Freq: Every day | ORAL | Status: DC

## 2015-09-20 MED ORDER — BETAMETHASONE DIPROPIONATE 0.05 % EX CREA: TOPICAL_CREAM | Freq: Two times a day (BID) | CUTANEOUS | Status: DC

## 2015-09-20 MED ORDER — VENLAFAXINE HCL ER 150 MG PO CP24: 150.0000 mg | ORAL_CAPSULE | Freq: Every day | ORAL | Status: DC

## 2015-09-20 NOTE — Assessment & Plan Note (Addendum)
Worsened in past 6 weeks, uncontrolled, not suicidal or homicidal medication doses increased , and referred to psychiatry, she wants to go both for therapy as well as medication management. H/o suicidal ideation in the past

## 2015-09-20 NOTE — Assessment & Plan Note (Signed)
C/o pruritic rash in groin, 1 week course of lamisil and topical steroid prescribed for 10 days

## 2015-09-20 NOTE — Progress Notes (Signed)
Subjective:    Patient ID: [name], female    DOB: 10-Mar-1981, 35 y.o.   MRN: [id number]  HPI   [name]     MRN: [id number]      DOB: [birth year]   HPI Ms. Hebard is here for follow up and re-evaluation of chronic medical conditions, medication management and review of any available recent lab and radiology data.  Preventive health is updated, specifically  Cancer screening and Immunization.   Questions or concerns regarding consultations or procedures which the PT has had in the interim are  Addressed.Has been following with neurology for uncontrolled seizure disorder The PT denies any adverse reactions to current medications since the last visit.  C/o increased depression, anxiety and insomnia, not suicidal or homicidal but requests additional help   ROS Denies recent fever or chills. Denies sinus pressure, nasal congestion, ear pain or sore throat. Denies chest congestion, productive cough or wheezing. Denies chest pains, palpitations and leg swelling Denies abdominal pain, nausea, vomiting,diarrhea or constipation.   Denies dysuria, frequency, hesitancy or incontinence. Denies joint pain, swelling and limitation in mobility. Denies headaches, , numbness, or tingling. . Denies skin break down or rash.   PE  BP 116/62 mmHg  Pulse 76  Resp 18  Ht  (1.549 m)  Wt 166 lb 0.6 oz (75.315 kg)  BMI 31.39 kg/m2  SpO2 96%  Patient alert and oriented and in no cardiopulmonary distress.  HEENT: No facial asymmetry, EOMI,   oropharynx pink and moist.  Neck supple no JVD, no mass.  Chest: Clear to auscultation bilaterally.decfreeased though adequate air entry  CVS: S1, S2 no murmurs, no S3.Regular rate.  ABD: Soft non tender.   Ext: No edema  MS: Adequate ROM spine, shoulders, hips and knees.  Skin: Intact, fungal rash in groin ( by history)  Psych: Good eye contact, flat affect, good eye contact, not anxious, mildly depressed appearing  CNS: CN  2-12 intact, power,  normal throughout.no focal deficits noted.   Assessment & Plan  Depression, major (HCC) Worsened in past 6 weeks, uncontrolled, not suicidal or homicidal medication doses increased , and referred to psychiatry, she wants to go both for therapy as well as medication management. H/o suicidal ideation in the past  NICOTINE ADDICTION Deteriorated  Patient counseled for approximately 5 minutes regarding the health risks of ongoing nicotine use, specifically all types of cancer, heart disease, stroke and respiratory failure. The options available for help with cessation ,the behavioral changes to assist the process, and the option to either gradully reduce usage  Or abruptly stop.is also discussed. Pt is also encouraged to set specific goals in number of cigarettes used daily, as well as to set a quit date.  Number of cigarettes/cigars currently smoking daily: 30   Dermatomycosis C/o pruritic rash in groin, 1 week course of lamisil and topical steroid prescribed for 10 days  SEIZURE DISORDER Managed by neurology, has had recent episodes of break through seizure activity requiring Ed eval,uatio in past 6 months  Obesity (BMI 30.0-34.9) Deteriorated. Patient re-educated about  the importance of commitment to a  minimum of 150 minutes of exercise per week.  The importance of healthy food choices with portion control discussed. Encouraged to start a food diary, count calories and to consider  joining a support group. Sample diet sheets offered. Goals set by the patient for the next several months.   Weight /BMI 09/20/2015 09/05/2015 12/26/2014  WEIGHT 166 lb 0.6 oz 160 lb 146  lb  HEIGHT     BMI 31.39 kg/m2 30.25 kg/m2 27.6 kg/m2    Current exercise per week 60  minutes.   Need for prophylactic vaccination and inoculation against influenza After obtaining informed consent, the vaccine is  administered by LPN.       Review of Systems       Objective:   Physical Exam        Assessment & Plan:

## 2015-09-20 NOTE — Patient Instructions (Addendum)
F/u in 4 month, call if you need me sooner  Increase in doses of remeron and effexor, and  you are being referred to psychiatry  Tablet sent for 2 weeks for itch and cream prescribed also  Please work on quitting smoking  Flu vaccine today

## 2015-09-20 NOTE — Assessment & Plan Note (Addendum)
Deteriorated Patient counseled for approximately 5 minutes regarding the health risks of ongoing nicotine use, specifically all types of cancer, heart disease, stroke and respiratory failure. The options available for help with cessation ,the behavioral changes to assist the process, and the option to either gradully reduce usage  Or abruptly stop.is also discussed. Pt is also encouraged to set specific goals in number of cigarettes used daily, as well as to set a quit date.  Number of cigarettes/cigars currently smoking daily: 30  

## 2015-09-24 NOTE — Assessment & Plan Note (Signed)
Deteriorated. Patient re-educated about  the importance of commitment to a  minimum of 150 minutes of exercise per week.  The importance of healthy food choices with portion control discussed. Encouraged to start a food diary, count calories and to consider  joining a support group. Sample diet sheets offered. Goals set by the patient for the next several months.   Weight /BMI 09/20/2015 09/05/2015 12/26/2014  WEIGHT 166 lb 0.6 oz 160 lb 146 lb  HEIGHT 5\' 1"  5\' 1"  5\' 1"   BMI 31.39 kg/m2 30.25 kg/m2 27.6 kg/m2    Current exercise per week 60  minutes.

## 2015-09-24 NOTE — Assessment & Plan Note (Signed)
After obtaining informed consent, the vaccine is  administered by LPN.  

## 2015-09-24 NOTE — Assessment & Plan Note (Signed)
Managed by neurology, has had recent episodes of break through seizure activity requiring Ed eval,uatio in past 6 months

## 2015-10-18 IMAGING — CT CT HEAD W/O CM
1 series · 16 of 30 positions shown, 20 images · non-contrast
Comparison: Prior head CT 05/18/2013

CLINICAL DATA: Altered mental status, seizure

EXAM:
CT HEAD WITHOUT CONTRAST
TECHNIQUE: Contiguous axial images were obtained from the base of the skull
through the vertex without intravenous contrast.

[Series 2: headtrauma 4.8 h37s · axial · 0.39mm/px · z∈[+68,+196]mm · 16 of 30 slices shown, 20 images]
[im 2/30  brain]
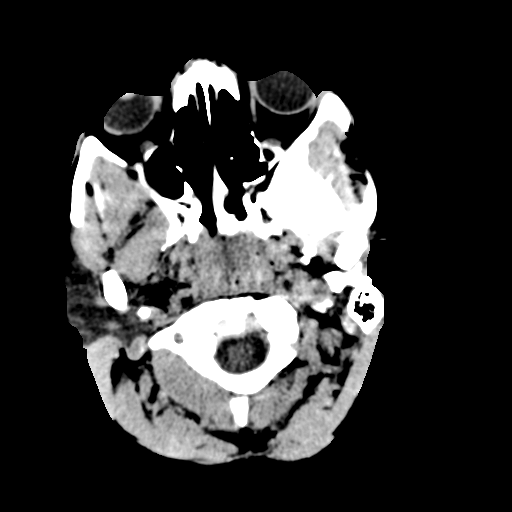
[im 2/30  bone]
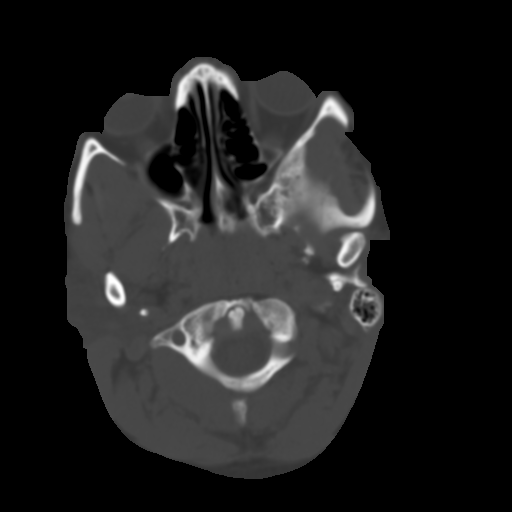
[im 4/30  brain]
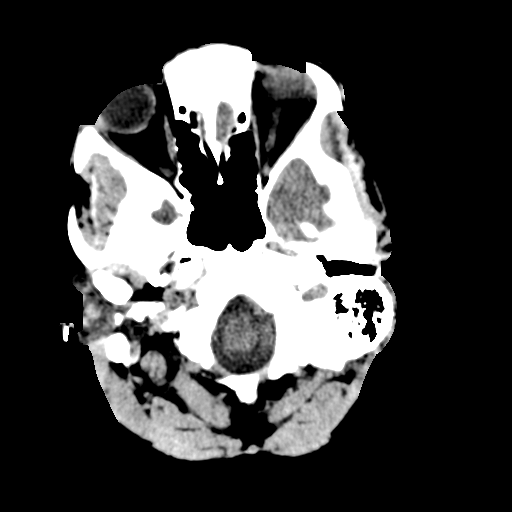
[im 6/30  brain]
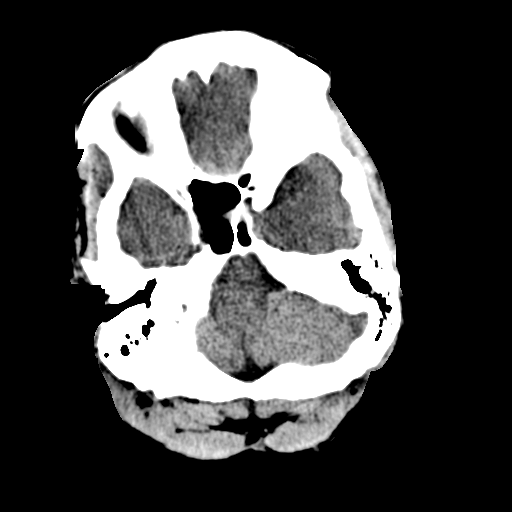
[im 8/30  brain]
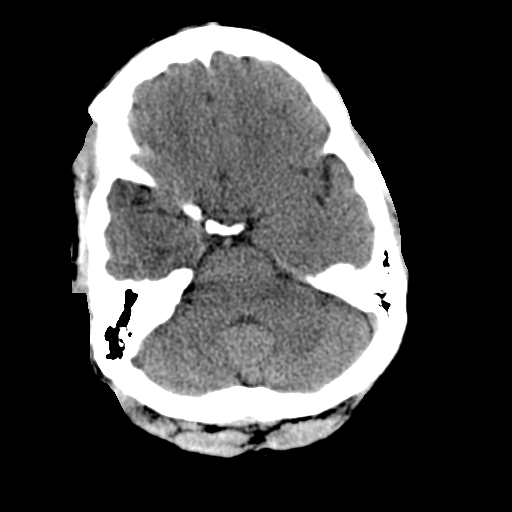
[im 9/30  brain]
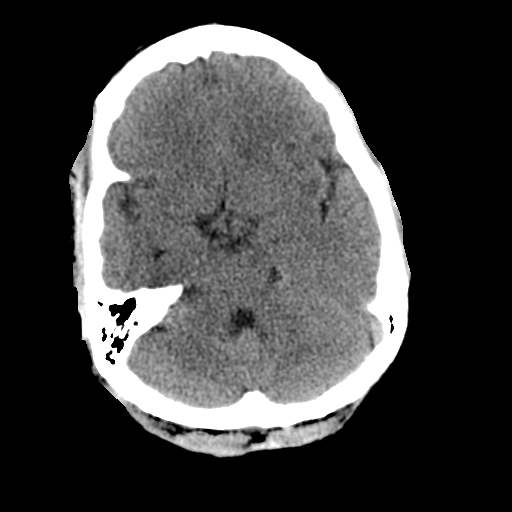
[im 9/30  bone]
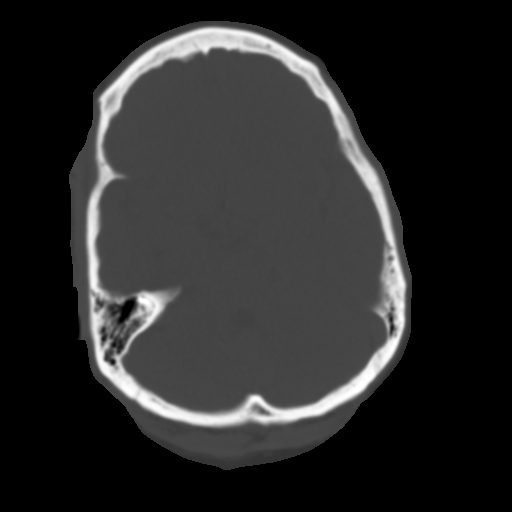
[im 11/30  brain]
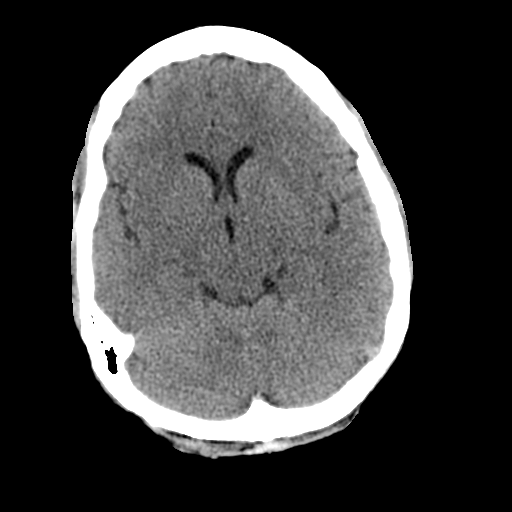
[im 13/30  brain]
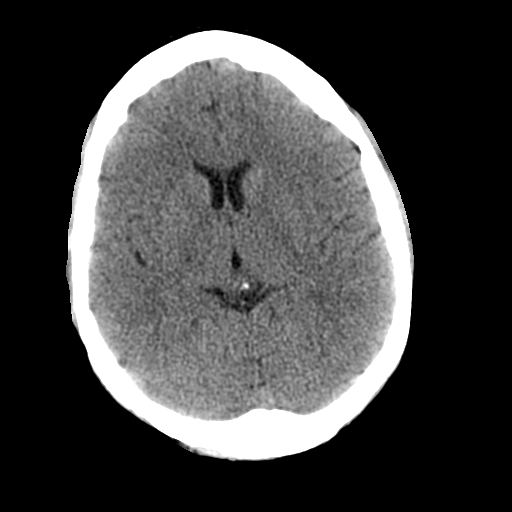
[im 15/30  brain]
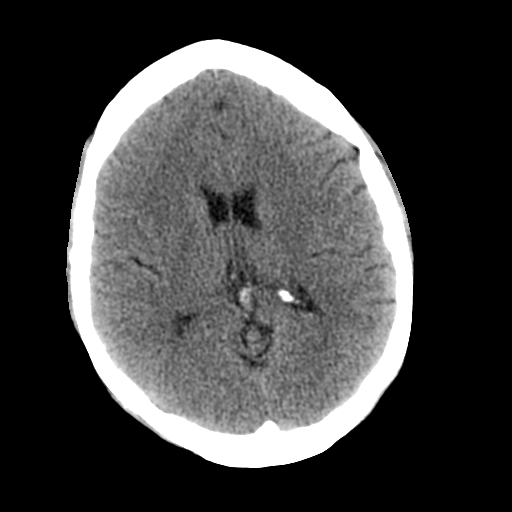
[im 16/30  brain]
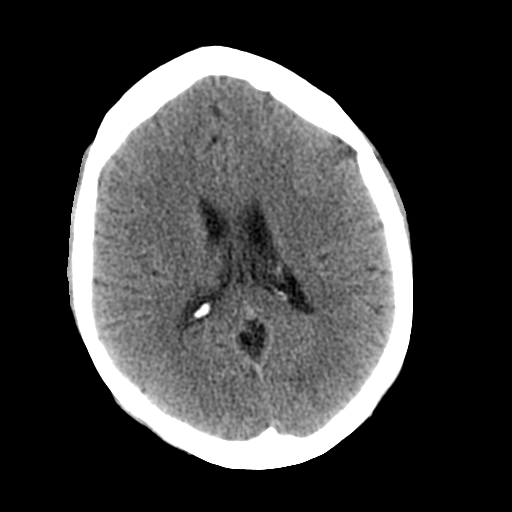
[im 16/30  bone]
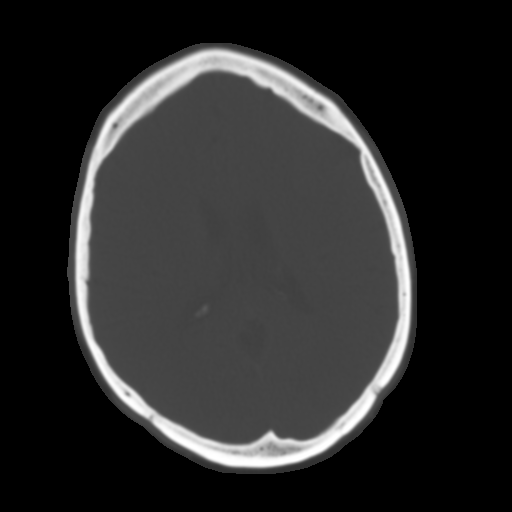
[im 18/30  brain]
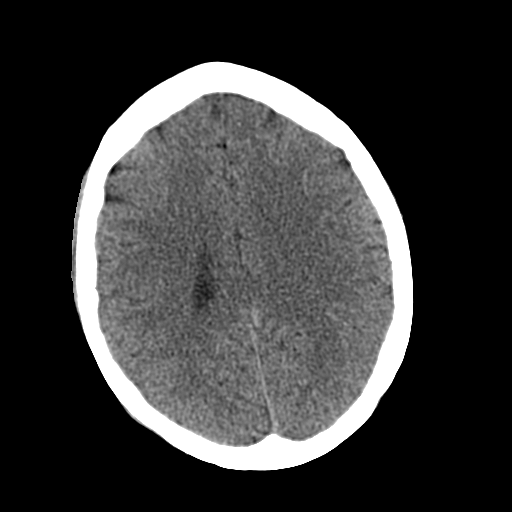
[im 20/30  brain]
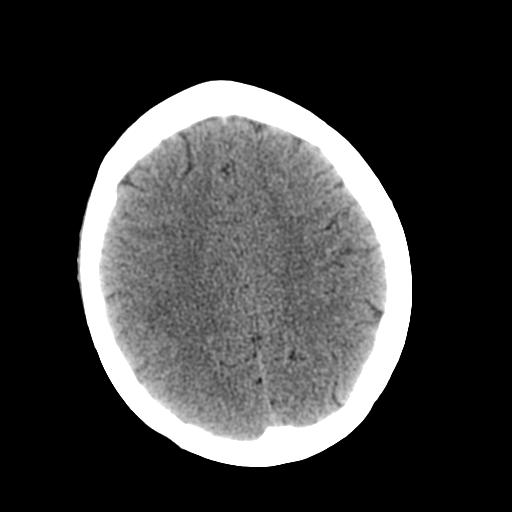
[im 22/30  brain]
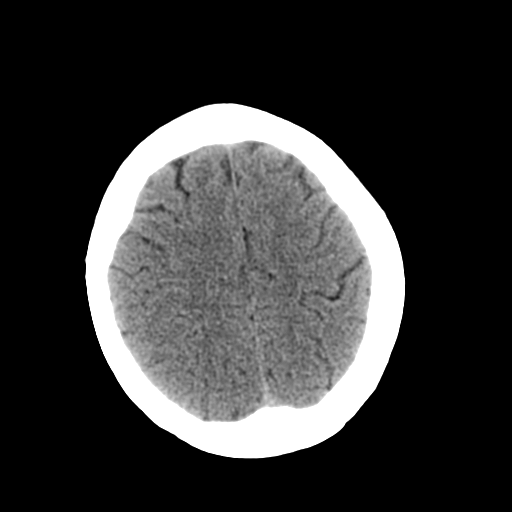
[im 23/30  brain]
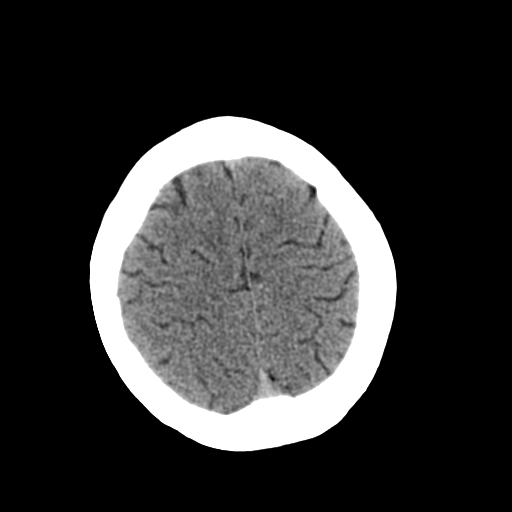
[im 23/30  bone]
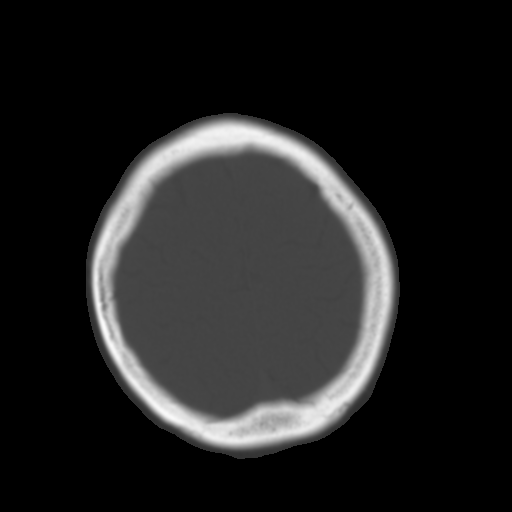
[im 25/30  brain]
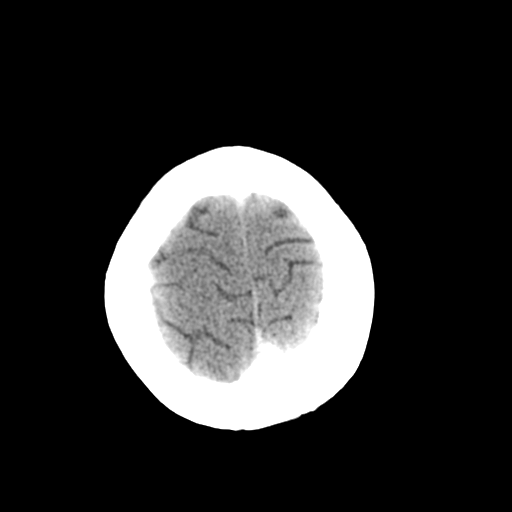
[im 27/30  brain]
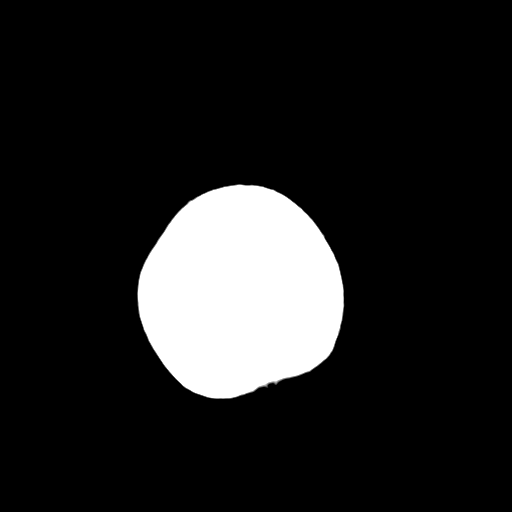
[im 29/30  brain]
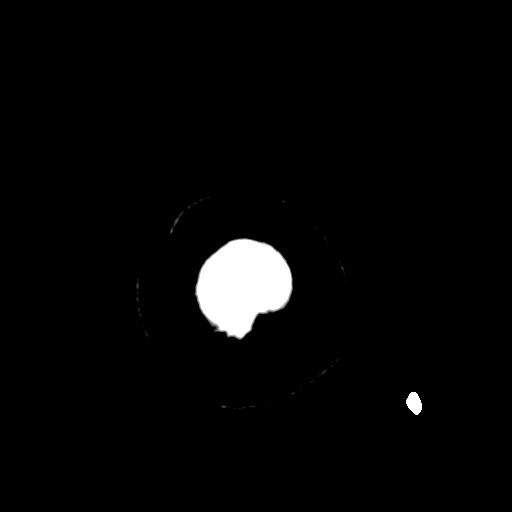

[16 of 30 positions shown; findings below may reference images not displayed]

FINDINGS: Negative for acute intracranial hemorrhage, acute infarction, mass,
mass effect, hydrocephalus or midline shift. Gray-white
differentiation is preserved throughout. No acute soft tissue or
calvarial abnormality. The globes and orbits are symmetric and
unremarkable. Normal aeration of the mastoid air cells and
visualized paranasal sinuses.
IMPRESSION: Negative head CT.

## 2015-11-02 ENCOUNTER — Other Ambulatory Visit: Payer: Self-pay | Admitting: Family Medicine

## 2015-11-07 ENCOUNTER — Other Ambulatory Visit: Payer: Self-pay

## 2015-11-07 MED ORDER — FAMCICLOVIR 250 MG PO TABS: 250.0000 mg | ORAL_TABLET | Freq: Two times a day (BID) | ORAL | Status: DC

## 2015-11-09 ENCOUNTER — Ambulatory Visit (INDEPENDENT_AMBULATORY_CARE_PROVIDER_SITE_OTHER): Payer: Medicare Other | Admitting: *Deleted

## 2015-11-09 ENCOUNTER — Encounter: Payer: Self-pay | Admitting: *Deleted

## 2015-11-09 DIAGNOSIS — Z3202 Encounter for pregnancy test, result negative: Secondary | ICD-10-CM

## 2015-11-09 DIAGNOSIS — Z3042 Encounter for surveillance of injectable contraceptive: Secondary | ICD-10-CM

## 2015-11-09 LAB — POCT URINE PREGNANCY: Preg Test, Ur: NEGATIVE

## 2015-11-09 MED ORDER — MEDROXYPROGESTERONE ACETATE 150 MG/ML IM SUSP
150.0000 mg | Freq: Once | INTRAMUSCULAR | Status: AC
  Administered 2015-11-09: 150 mg via INTRAMUSCULAR

## 2015-11-09 NOTE — Progress Notes (Signed)
Pt here for Depo. Pt tolerated shot well. Return in 12 weeks for next shot. JSY 

## 2015-11-19 ENCOUNTER — Emergency Department (HOSPITAL_COMMUNITY): Payer: Medicare Other

## 2015-11-19 ENCOUNTER — Observation Stay (HOSPITAL_COMMUNITY)
Admission: EM | Admit: 2015-11-19 | Discharge: 2015-11-20 | Disposition: A | Discharge status: Home / Self Care | Payer: Medicare Other | Attending: Internal Medicine | Admitting: Internal Medicine

## 2015-11-19 ENCOUNTER — Encounter (HOSPITAL_COMMUNITY): Payer: Self-pay | Admitting: Emergency Medicine

## 2015-11-19 DIAGNOSIS — Z79899 Other long term (current) drug therapy: Secondary | ICD-10-CM | POA: Insufficient documentation

## 2015-11-19 DIAGNOSIS — R569 Unspecified convulsions: Secondary | ICD-10-CM

## 2015-11-19 DIAGNOSIS — F329 Major depressive disorder, single episode, unspecified: Secondary | ICD-10-CM | POA: Diagnosis not present

## 2015-11-19 DIAGNOSIS — F17210 Nicotine dependence, cigarettes, uncomplicated: Secondary | ICD-10-CM | POA: Insufficient documentation

## 2015-11-19 DIAGNOSIS — G40901 Epilepsy, unspecified, not intractable, with status epilepticus: Secondary | ICD-10-CM | POA: Diagnosis present

## 2015-11-19 DIAGNOSIS — Z79891 Long term (current) use of opiate analgesic: Secondary | ICD-10-CM | POA: Insufficient documentation

## 2015-11-19 DIAGNOSIS — Z791 Long term (current) use of non-steroidal anti-inflammatories (NSAID): Secondary | ICD-10-CM | POA: Diagnosis not present

## 2015-11-19 DIAGNOSIS — R4182 Altered mental status, unspecified: Secondary | ICD-10-CM | POA: Insufficient documentation

## 2015-11-19 DIAGNOSIS — G40909 Epilepsy, unspecified, not intractable, without status epilepticus: Secondary | ICD-10-CM | POA: Diagnosis not present

## 2015-11-19 LAB — BASIC METABOLIC PANEL
ANION GAP: 8 (ref 5–15)
BUN: 12 mg/dL (ref 6–20)
CO2: 23 mmol/L (ref 22–32)
Calcium: 9 mg/dL (ref 8.9–10.3)
Chloride: 107 mmol/L (ref 101–111)
Creatinine, Ser: 0.87 mg/dL (ref 0.44–1.00)
Glucose, Bld: 104 mg/dL — ABNORMAL HIGH (ref 65–99)
POTASSIUM: 4 mmol/L (ref 3.5–5.1)
SODIUM: 138 mmol/L (ref 135–145)

## 2015-11-19 LAB — CBC
HCT: 42.9 % (ref 36.0–46.0)
Hemoglobin: 14.2 g/dL (ref 12.0–15.0)
MCH: 32.1 pg (ref 26.0–34.0)
MCHC: 33.1 g/dL (ref 30.0–36.0)
MCV: 96.8 fL (ref 78.0–100.0)
PLATELETS: 363 10*3/uL (ref 150–400)
RBC: 4.43 MIL/uL (ref 3.87–5.11)
RDW: 13.8 % (ref 11.5–15.5)
WBC: 8.7 10*3/uL (ref 4.0–10.5)

## 2015-11-19 LAB — MAGNESIUM: Magnesium: 1.8 mg/dL (ref 1.7–2.4)

## 2015-11-19 MED ORDER — MIRTAZAPINE 30 MG PO TABS
30.0000 mg | ORAL_TABLET | Freq: Every day | ORAL | Status: DC
  Administered 2015-11-19: 30 mg via ORAL
  Filled 2015-11-19: qty 1

## 2015-11-19 MED ORDER — METHOCARBAMOL 500 MG PO TABS: 500.0000 mg | ORAL_TABLET | Freq: Two times a day (BID) | ORAL | Status: DC | PRN

## 2015-11-19 MED ORDER — LAMOTRIGINE 25 MG PO TABS: 200.0000 mg | ORAL_TABLET | ORAL | Status: DC

## 2015-11-19 MED ORDER — LAMOTRIGINE ER 250 MG PO TB24: 500.0000 mg | ORAL_TABLET | Freq: Every day | ORAL | Status: DC

## 2015-11-19 MED ORDER — SODIUM CHLORIDE 0.9 % IV SOLN
500.0000 mg | Freq: Two times a day (BID) | INTRAVENOUS | Status: DC
  Administered 2015-11-19 – 2015-11-20 (×2): 500 mg via INTRAVENOUS
  Filled 2015-11-19 (×4): qty 5

## 2015-11-19 MED ORDER — LORAZEPAM 2 MG/ML IJ SOLN
2.0000 mg | Freq: Once | INTRAMUSCULAR | Status: DC
  Filled 2015-11-19: qty 1

## 2015-11-19 MED ORDER — NICOTINE 14 MG/24HR TD PT24
14.0000 mg | MEDICATED_PATCH | Freq: Every day | TRANSDERMAL | Status: DC
  Administered 2015-11-19 – 2015-11-20 (×2): 14 mg via TRANSDERMAL
  Filled 2015-11-19 (×2): qty 1

## 2015-11-19 MED ORDER — LORAZEPAM 2 MG/ML IJ SOLN
1.0000 mg | Freq: Once | INTRAMUSCULAR | Status: AC
  Administered 2015-11-19: 1 mg via INTRAVENOUS
  Filled 2015-11-19: qty 1

## 2015-11-19 MED ORDER — ACETAMINOPHEN 650 MG RE SUPP
650.0000 mg | RECTAL | Status: DC | PRN
Start: 2015-11-19 — End: 2015-11-20

## 2015-11-19 MED ORDER — LEVETIRACETAM IN NACL 1000 MG/100ML IV SOLN
1000.0000 mg | Freq: Once | INTRAVENOUS | Status: AC
  Administered 2015-11-19: 1000 mg via INTRAVENOUS
  Filled 2015-11-19: qty 100

## 2015-11-19 MED ORDER — POLYETHYLENE GLYCOL 3350 17 G PO PACK: 17.0000 g | PACK | Freq: Every day | ORAL | Status: DC | PRN

## 2015-11-19 MED ORDER — SODIUM CHLORIDE 0.9 % IV SOLN
75.0000 mL/h | INTRAVENOUS | Status: DC
  Administered 2015-11-19: 75 mL/h via INTRAVENOUS

## 2015-11-19 MED ORDER — LAMOTRIGINE ER 250 MG PO TB24
500.0000 mg | ORAL_TABLET | Freq: Every day | ORAL | Status: DC
  Administered 2015-11-19: 500 mg via ORAL

## 2015-11-19 MED ORDER — LORAZEPAM 2 MG/ML IJ SOLN
0.5000 mg | INTRAMUSCULAR | Status: DC | PRN
  Administered 2015-11-19: 0.5 mg via INTRAVENOUS
  Filled 2015-11-19: qty 1

## 2015-11-19 MED ORDER — ACETAMINOPHEN 500 MG PO TABS
1000.0000 mg | ORAL_TABLET | Freq: Once | ORAL | Status: AC
  Administered 2015-11-19: 1000 mg via ORAL
  Filled 2015-11-19: qty 2

## 2015-11-19 MED ORDER — ACETAMINOPHEN 325 MG PO TABS: 650.0000 mg | ORAL_TABLET | ORAL | Status: DC | PRN

## 2015-11-19 MED ORDER — ONDANSETRON HCL 4 MG/2ML IJ SOLN: 4.0000 mg | Freq: Three times a day (TID) | INTRAMUSCULAR | Status: DC | PRN

## 2015-11-19 MED ORDER — LORAZEPAM 2 MG/ML IJ SOLN
1.0000 mg | Freq: Once | INTRAMUSCULAR | Status: AC
  Administered 2015-11-19: 1 mg via INTRAVENOUS

## 2015-11-19 MED ORDER — LEVETIRACETAM IN NACL 500 MG/100ML IV SOLN
INTRAVENOUS | Status: AC
  Filled 2015-11-19: qty 200

## 2015-11-19 NOTE — Progress Notes (Signed)
Pt continues to be agitated and trying to leave.  Paged Dr Adrian BlackwaterStinson to see if able to give PRN.  Ativan 0.5 mg IV Q4H PRN order obtained.  Will continue to monitor pt.

## 2015-11-19 NOTE — ED Notes (Signed)
Report given to Becky, RN for room 311 

## 2015-11-19 NOTE — ED Provider Notes (Signed)
CSN: [id number]     Arrival date & time 11/19/15  [id number] History  By signing my name below, I, Linus GalasMaharshi Patel, attest that this documentation has been prepared under the direction and in the presence of Eber HongBrian Morgyn Marut, MD. Electronically Signed: Linus GalasMaharshi Patel, ED Scribe. 11/19/2015. 9:40 AM.   Chief Complaint  Patient presents with  . Seizures   Level 5 caveat due to altered mental status   The history is provided by the patient and the EMS personnel. No language interpreter was used.   HPI Comments: [name] is a 35 y.o. female brought by EMS to the Emergency Department complaining of seizure activity, earlier today. As per EMS, pt had a "small seizure" yesterday and then a "larger one" today. Today, pt was found by her son who called EMS. Pt does not know why she is here. Pt complains of HA. As per pt, she is complaint with her lamotrigine. Pt denies any leg pain, abdominal pain, CP, SOB, or any other symptoms a this time. Pt states she occasionally drinks alcohol and smokes marijuana. She states she may have had beer to drink yesterday and possibly smoked marijuana.   As per chart review, pt had a negative CT brain 2 years ago after a seizure.   Past Medical History  Diagnosis Date  . Seizures (HCC)   . Depression   . Back pain   . Genital warts     herpes  . Herpes   . Chronic headaches    Past Surgical History  Procedure Laterality Date  . Cesarean section      x 3   History reviewed. No pertinent family history. Social History  Substance Use Topics  . Smoking status: Current Every Day Smoker -- 1.00 packs/day for 10 years    Types: Cigarettes  . Smokeless tobacco: Never Used  . Alcohol Use: 1.2 oz/week    2 Cans of beer per week     Comment: occasionally   OB History    Gravida Para Term Preterm AB TAB SAB Ectopic Multiple Living   3 3        2      Review of Systems  Unable to perform ROS: Mental status change   Allergies  Morphine  Home Medications    Prior to Admission medications   Medication Sig Start Date End Date Taking? Authorizing Provider  famciclovir (FAMVIR) 250 MG tablet Take 1 tablet (250 mg total) by mouth 2 (two) times daily. 11/07/15  Yes Kerri PerchesMargaret E Simpson, MD  HYDROcodone-acetaminophen (NORCO) 7.5-325 MG per tablet Take 1 tablet by mouth daily as needed for moderate pain.  11/28/14  Yes Historical Provider, MD  ibuprofen (ADVIL,MOTRIN) 800 MG tablet Take 1 tablet (800 mg total) by mouth 3 (three) times daily. 12/26/14  Yes Eber HongBrian Shreyas Piatkowski, MD  LamoTRIgine 250 MG TB24 Takes 2 tabs at bedtime. 08/11/14  Yes Historical Provider, MD  medroxyPROGESTERone (DEPO-PROVERA) 150 MG/ML injection INJECT 1 ML IN THE MUSCLE ONCE EVERY 3 MONTHS AS DIRECTED 08/15/15  Yes Adline PotterJennifer A Griffin, NP  methocarbamol (ROBAXIN) 500 MG tablet Take 1 tablet by mouth 2 (two) times daily as needed for muscle spasms.  09/26/14  Yes Historical Provider, MD  mirtazapine (REMERON) 30 MG tablet Take 1 tablet (30 mg total) by mouth at bedtime. 09/20/15  Yes Kerri PerchesMargaret E Simpson, MD  rizatriptan (MAXALT) 10 MG tablet Take 10 mg by mouth as needed for migraine. Reported on 09/20/2015   Yes Historical Provider, MD   BP 134/100  mmHg  Pulse 90  Temp(Src) 98.3 F (36.8 C) (Oral)  Resp 18  Ht  (1.575 m)  Wt 160 lb (72.576 kg)  BMI 29.26 kg/m2  SpO2 98%   Physical Exam  Constitutional: She appears well-developed and well-nourished. No distress.  Mild confusion  HENT:  Head: Normocephalic and atraumatic.  Mouth/Throat: Oropharynx is clear and moist. No oropharyngeal exudate.  Mild contusion to the right lower lip, no laceration  Eyes: Conjunctivae and EOM are normal. Pupils are equal, round, and reactive to light. Right eye exhibits no discharge. Left eye exhibits no discharge. No scleral icterus.  Neck: Normal range of motion. Neck supple. No JVD present. No thyromegaly present.  Cardiovascular: Normal rate, regular rhythm, normal heart sounds and intact distal pulses.   Exam reveals no gallop and no friction rub.   No murmur heard. Pulmonary/Chest: Effort normal and breath sounds normal. No respiratory distress. She has no wheezes. She has no rales.  Abdominal: Soft. Bowel sounds are normal. She exhibits no distension and no mass. There is no tenderness.  Musculoskeletal: Normal range of motion. She exhibits no edema or tenderness.  Lymphadenopathy:    She has no cervical adenopathy.  Neurological: Coordination normal.  Does not follow commands well, non focal. - but has some difficulty with speech, she has no focal weakness and is able to move all 4 extremities.  She has no seizure activity on arrival.  Skin: Skin is warm and dry. No rash noted. No erythema.  Psychiatric: She has a normal mood and affect. Her behavior is normal.  Nursing note and vitals reviewed.   ED Course  Procedures  DIAGNOSTIC STUDIES: Oxygen Saturation is 97% on room air, normal by my interpretation.    COORDINATION OF CARE: 9:33 AM Will give Tylenol and Ativan. Will order EKG. Discussed treatment plan with pt at bedside and pt agreed to plan.  Labs Review Labs Reviewed  BASIC METABOLIC PANEL - Abnormal; Notable for the following:    Glucose, Bld 104 (*)    All other components within normal limits  CBC  LAMOTRIGINE LEVEL    Imaging Review Ct Head Wo Contrast  11/19/2015  CLINICAL DATA:  Seizure yesterday and had another seizure this am. EMS reports pt was standing up on arrival on scene alert but disoriented. PT c/o headache on arrival to ED. EXAM: CT HEAD WITHOUT CONTRAST TECHNIQUE: Contiguous axial images were obtained from the base of the skull through the vertex without intravenous contrast. COMPARISON:  04/11/2014 FINDINGS: There is no evidence of mass effect, midline shift or extra-axial fluid collections. There is no evidence of a space-occupying lesion or intracranial hemorrhage. There is no evidence of a cortical-based area of acute infarction. The ventricles and  sulci are appropriate for the patient's age. The basal cisterns are patent. Visualized portions of the orbits are unremarkable. The visualized portions of the paranasal sinuses and mastoid air cells are unremarkable. The osseous structures are unremarkable. IMPRESSION: No acute intracranial pathology. Electronically Signed   By: Elige Ko   On: 11/19/2015 15:11   I have personally reviewed and evaluated these images and lab results as part of my medical decision-making.  After a while the patient's family arrived and was able to give further information. The patient improved significantly but never really returned back to her baseline, she still had some persistent confusion. She was given medications including Ativan but had no further seizure activity until family members arrived. The daughter witnessed a similar seizure activity to what  she had this morning which she described as lip smacking and unusual movement of her hand followed by tonic-clonic activity and then being confused and agitated. On my exam at this time the patient has the ability to move all 4 extremities, she has difficulty understanding what is being asked of her, she is not compliant with exam, she ripped out her IV, she has confusion and agitation. She will be given intramuscular Ativan, labs, she will be admitted to the hospital for ongoing seizures without return to baseline. Classically this is status epilepticus however the patient does not appear in acute distress or toxic at this time. We'll also give Keppra and consult with hospitalist for admission after CT scan to rule out intracranial findings. Given the focal nature of the patient's seizure at the beginning of the seizure it wouldn't suggest the need for ongoing evaluation. Review of the medical record did show that in 2014 she had an EEG which was unremarkable in the awake and resting states  Family states she is not taking her meds.  MDM   Final diagnoses:  Seizures  (HCC)    D/w Dr. Adrian Blackwater who will admit No further seizures after Keppra  Meds given in ED:  Medications  acetaminophen (TYLENOL) tablet 1,000 mg (1,000 mg Oral Given 11/19/15 0949)  LORazepam (ATIVAN) injection 1 mg (1 mg Intravenous Given 11/19/15 0949)  levETIRAcetam (KEPPRA) IVPB 1000 mg/100 mL premix (1,000 mg Intravenous Given 11/19/15 1355)  LORazepam (ATIVAN) injection 1 mg (1 mg Intravenous Given 11/19/15 1355)       I personally performed the services described in this documentation, which was scribed in my presence. The recorded information has been reviewed and is accurate.     Eber Hong, MD 11/19/15 1524

## 2015-11-19 NOTE — ED Notes (Signed)
PT daughter ran and reported that her mother was having seizure like activity. On arrival to room pt sitting on side of bed alert and disoriented to time and place and pulled out her IV. When I tried to console her she cursed at me loudly and grabbed my wrist and I was able to calm her down quickly. MD at bedside and new orders placed at this time. Son and daughter present at bedside.

## 2015-11-19 NOTE — H&P (Signed)
History and Physical  Elizabeth NapDorothy M Hansen JXB:[id number] DOB: June 12, 1981 DOA: 11/19/2015  Referring physician: Dr Hyacinth MeekerMiller, ED physician PCP: Syliva OvermanMargaret Simpson, MD  Outpatient Specialists:   Dr Gerilyn Pilgrimoonquah (Neurology)  Chief Complaint: Seizures  HPI: [name] is a 35 y.o. female with a history of seizure disorder on lamictal.  Per the patient, she is managed by Dr Gerilyn Pilgrimoonquah, although is currently unable to tell me her last appt with her.  She reports fairly frequent seizures, mostly at night when she is sleeping, and describes a frequency of 2-3 times a week.  Her niece also has a seizure disorder with similar episodes.  Her seizures start with lip smacking, then progresses to tonic-clonic seizures. She had one episode at home with her kids yesterday and this morning. EMS was called and brought her to the emergency department. He was describes the patient as postictal. This started to clear the emergency department, but the patient had another seizure here. The nurse witnessed the tail end of the last seizure. The patient is slightly confused on my interview, but reports taking her medications today.   Review of Systems:   Pt denies any fevers, chills, nausea, vomiting, diarrhea, constipation, abdominal pain, shortness of breath, dyspnea on exertion, orthopnea, cough, wheezing, palpitations, headache, vision changes, lightheadedness, dizziness, melena, rectal bleeding.  Review of systems are otherwise negative  Past Medical History  Diagnosis Date  . Seizures (HCC)   . Depression   . Back pain   . Genital warts     herpes  . Herpes   . Chronic headaches    Past Surgical History  Procedure Laterality Date  . Cesarean section      x 3   Social History:  reports that she has been smoking Cigarettes.  She has a 10 pack-year smoking history. She has never used smokeless tobacco. She reports that she drinks about 1.2 oz of alcohol per week. She reports that she uses illicit drugs  (Marijuana). Patient lives at home  Allergies  Allergen Reactions  . Morphine Other (See Comments)    Unknown Reaction     Family history of seizure disorder  Prior to Admission medications   Medication Sig Start Date End Date Taking? Authorizing Provider  famciclovir (FAMVIR) 250 MG tablet Take 1 tablet (250 mg total) by mouth 2 (two) times daily. 11/07/15  Yes Kerri PerchesMargaret E Simpson, MD  HYDROcodone-acetaminophen (NORCO) 7.5-325 MG per tablet Take 1 tablet by mouth daily as needed for moderate pain.  11/28/14  Yes Historical Provider, MD  ibuprofen (ADVIL,MOTRIN) 800 MG tablet Take 1 tablet (800 mg total) by mouth 3 (three) times daily. 12/26/14  Yes Eber HongBrian Miller, MD  LamoTRIgine 250 MG TB24 Takes 2 tabs at bedtime. 08/11/14  Yes Historical Provider, MD  medroxyPROGESTERone (DEPO-PROVERA) 150 MG/ML injection INJECT 1 ML IN THE MUSCLE ONCE EVERY 3 MONTHS AS DIRECTED 08/15/15  Yes Adline PotterJennifer A Griffin, NP  methocarbamol (ROBAXIN) 500 MG tablet Take 1 tablet by mouth 2 (two) times daily as needed for muscle spasms.  09/26/14  Yes Historical Provider, MD  mirtazapine (REMERON) 30 MG tablet Take 1 tablet (30 mg total) by mouth at bedtime. 09/20/15  Yes Kerri PerchesMargaret E Simpson, MD  rizatriptan (MAXALT) 10 MG tablet Take 10 mg by mouth as needed for migraine. Reported on 09/20/2015   Yes Historical Provider, MD    Physical Exam: BP 134/100 mmHg  Pulse 90  Temp(Src) 98.3 F (36.8 C) (Oral)  Resp 18  Ht 5\' 2"  (1.575 m)  Wt 160 lb (72.576 kg)  BMI 29.26 kg/m2  SpO2 98%  General: Young black female. Awake and alert and oriented x3. No acute cardiopulmonary distress.  HEENT: Normocephalic atraumatic.  Right and left ears normal in appearance.  Pupils equal, round, reactive to light. Extraocular muscles are intact. Sclerae anicteric and noninjected.  Moist mucosal membranes. No mucosal lesions.  Neck: Neck supple without lymphadenopathy. No carotid bruits. No masses palpated.  Cardiovascular: Regular rate with  normal S1-S2 sounds. No murmurs, rubs, gallops auscultated. No JVD.  Respiratory: Good respiratory effort with no wheezes, rales, rhonchi. Lungs clear to auscultation bilaterally.  No accessory muscle use. Abdomen: Soft, nontender, nondistended. Active bowel sounds. No masses or hepatosplenomegaly  Skin: No rashes, lesions, or ulcerations.  Dry, warm to touch. 2+ dorsalis pedis and radial pulses. Musculoskeletal: No calf or leg pain. All major joints not erythematous nontender.  No upper or lower joint deformation.  Good ROM.  No contractures  Psychiatric: Intact judgment and insight. Pleasant and cooperative. Neurologic: No focal neurological deficits. Strength is 5/5 and symmetric in upper and lower extremities.  Cranial nerves II through XII are grossly intact.           Labs on Admission: I have personally reviewed following labs and imaging studies  CBC:  Recent Labs Lab 11/19/15 1353  WBC 8.7  HGB 14.2  HCT 42.9  MCV 96.8  PLT 363   Basic Metabolic Panel:  Recent Labs Lab 11/19/15 1353  NA 138  K 4.0  CL 107  CO2 23  GLUCOSE 104*  BUN 12  CREATININE 0.87  CALCIUM 9.0   GFR: Estimated Creatinine Clearance: 83.4 mL/min (by C-G formula based on Cr of 0.87). Liver Function Tests: No results for input(s): AST, ALT, ALKPHOS, BILITOT, PROT, ALBUMIN in the last 168 hours. No results for input(s): LIPASE, AMYLASE in the last 168 hours. No results for input(s): AMMONIA in the last 168 hours. Coagulation Profile: No results for input(s): INR, PROTIME in the last 168 hours. Cardiac Enzymes: No results for input(s): CKTOTAL, CKMB, CKMBINDEX, TROPONINI in the last 168 hours. BNP (last 3 results) No results for input(s): PROBNP in the last 8760 hours. HbA1C: No results for input(s): HGBA1C in the last 72 hours. CBG: No results for input(s): GLUCAP in the last 168 hours. Lipid Profile: No results for input(s): CHOL, HDL, LDLCALC, TRIG, CHOLHDL, LDLDIRECT in the last 72  hours. Thyroid Function Tests: No results for input(s): TSH, T4TOTAL, FREET4, T3FREE, THYROIDAB in the last 72 hours. Anemia Panel: No results for input(s): VITAMINB12, FOLATE, FERRITIN, TIBC, IRON, RETICCTPCT in the last 72 hours. Urine analysis:    Component Value Date/Time   COLORURINE YELLOW 06/09/2013 1825   APPEARANCEUR CLEAR 06/09/2013 1825   LABSPEC <1.005* 06/09/2013 1825   PHURINE 6.5 06/09/2013 1825   GLUCOSEU NEGATIVE 06/09/2013 1825   HGBUR NEGATIVE 06/09/2013 1825   BILIRUBINUR NEGATIVE 06/09/2013 1825   KETONESUR NEGATIVE 06/09/2013 1825   PROTEINUR NEGATIVE 06/09/2013 1825   UROBILINOGEN 0.2 06/09/2013 1825   NITRITE NEGATIVE 06/09/2013 1825   LEUKOCYTESUR NEGATIVE 06/09/2013 1825   Sepsis Labs: (procalcitonin:4,lacticidven:4) )No results found for this or any previous visit (from the past 240 hour(s)).   Radiological Exams on Admission: Ct Head Wo Contrast  11/19/2015  CLINICAL DATA:  Seizure yesterday and had another seizure this am. EMS reports pt was standing up on arrival on scene alert but disoriented. PT c/o headache on arrival to ED. EXAM: CT HEAD WITHOUT CONTRAST TECHNIQUE: Contiguous axial images were  obtained from the base of the skull through the vertex without intravenous contrast. COMPARISON:  04/11/2014 FINDINGS: There is no evidence of mass effect, midline shift or extra-axial fluid collections. There is no evidence of a space-occupying lesion or intracranial hemorrhage. There is no evidence of a cortical-based area of acute infarction. The ventricles and sulci are appropriate for the patient's age. The basal cisterns are patent. Visualized portions of the orbits are unremarkable. The visualized portions of the paranasal sinuses and mastoid air cells are unremarkable. The osseous structures are unremarkable. IMPRESSION: No acute intracranial pathology. Electronically Signed   By: Elige Ko   On: 11/19/2015 15:11    Assessment/Plan: Active  Problems:   Seizure [facility])    This patient was discussed with the ED physician, including pertinent vitals, physical exam findings, labs, and imaging.  We also discussed care given by the ED provider.  #1 seizure disorder  Observation on telemetry  Patient started on Keppra - bolused with 1000 mg, will continue with 500 mg twice a day  Consult neurology  Repeat metabolic panel tomorrow  UDS  DVT prophylaxis: Early ambulation, patient low risk Consultants: Neurology Code Status: Full code Family Communication: Nice and the room  Disposition Plan: Observation tonight   Levie Heritage, DO [facility] Pager [phone]  If 7PM-7AM, please contact night-coverage [email or web address] Password [email or web address]

## 2015-11-19 NOTE — ED Notes (Signed)
EMS reports pt family states had a seizure yesterday and had another seizure this am. EMS reports pt was standing up on arrival on scene alert but disoriented. PT c/o headache on arrival to ED.

## 2015-11-19 NOTE — ED Notes (Signed)
MD at bedside. 

## 2015-11-20 DIAGNOSIS — G4459 Other complicated headache syndrome: Secondary | ICD-10-CM | POA: Diagnosis not present

## 2015-11-20 DIAGNOSIS — F17200 Nicotine dependence, unspecified, uncomplicated: Secondary | ICD-10-CM | POA: Diagnosis not present

## 2015-11-20 DIAGNOSIS — R4182 Altered mental status, unspecified: Secondary | ICD-10-CM | POA: Diagnosis not present

## 2015-11-20 DIAGNOSIS — G40901 Epilepsy, unspecified, not intractable, with status epilepticus: Secondary | ICD-10-CM | POA: Diagnosis not present

## 2015-11-20 DIAGNOSIS — G40909 Epilepsy, unspecified, not intractable, without status epilepticus: Secondary | ICD-10-CM | POA: Diagnosis not present

## 2015-11-20 DIAGNOSIS — G40919 Epilepsy, unspecified, intractable, without status epilepticus: Secondary | ICD-10-CM | POA: Diagnosis not present

## 2015-11-20 DIAGNOSIS — Z79899 Other long term (current) drug therapy: Secondary | ICD-10-CM | POA: Diagnosis not present

## 2015-11-20 DIAGNOSIS — G43909 Migraine, unspecified, not intractable, without status migrainosus: Secondary | ICD-10-CM | POA: Diagnosis not present

## 2015-11-20 LAB — RAPID URINE DRUG SCREEN, HOSP PERFORMED
Amphetamines: NOT DETECTED
BARBITURATES: NOT DETECTED
BENZODIAZEPINES: POSITIVE — AB
Cocaine: NOT DETECTED
Opiates: NOT DETECTED
Tetrahydrocannabinol: POSITIVE — AB

## 2015-11-20 MED ORDER — PERAMPANEL 10 MG PO TABS: 10.0000 mg | ORAL_TABLET | Freq: Every day | ORAL | Status: DC

## 2015-11-20 NOTE — Care Management Obs Status (Signed)
MEDICARE OBSERVATION STATUS NOTIFICATION   Patient Details  Name: [name] MRN: [id number] Date of Birth: 1981-04-18   Medicare Observation Status Notification Given:  No (Discharged <24 hrs)    Malcolm Metrohildress, Elma Shands Demske, RN 11/20/2015, 2:57 PM

## 2015-11-20 NOTE — Consult Note (Signed)
[place] A. Merlene Laughter, MD     [email or web address]          [name] is an 35 y.o. female.   ASSESSMENT/PLAN: Resolving status epilepticus.  Baseline nocturnal epilepsy syndrome.  Post seizure headaches.      RECOMMENDATION: We will continue with the Lamictal at the current dose of 500 mg a day.  The patient will be restarted on fycompa 10 mg daily. For now will continue with the [place] but will likely wean this medication off later.   The patient a 35 year old black female who has a baseline history of nocturnal epilepsy syndrome. She was to be on Lamictal 500 milligrams once a day and fycompa 10. The new medication started over a year ago 10 mg daily. On the last appointment she was seen in February 2017, she was on fycompa 8 milligrams and the dose was increased to 10 mg. From all we can ascertain, it is uncertain if she was taking the higher dose of the fycompa. In fact, which I called her pharmacy together fill history but there are close. We did speak to the patient at length about his and her mother. Appears that she actually was likely not taken a new dose of the medication and only taking the Lamictal. She has been having seizures about twice a month in reviewing her history on our outpatient records. Again, the seizures occurs closely while she is sleeping. Patient was found unresponsive while sleeping and having a seizure. She was confused and postictal afterwards. She was taken to the hospital where she apparently had another event. She was loaded with Keppra. I believe she may try this in the past. The patient's was sleeping and her daughter noticed that she was having some jerks while she was sleeping a couple times or so. She is somewhat confused today in terms of her history which makes piece together what happened difficult. She reports having a severe headache yesterday after a seizure which is very typical of this patient. She does not have any  headaches today. She complains of pain involving the ring finger on the right which is somewhat swollen today. She otherwise seems to be doing okay. She does not report having focal numbness, weakness, chest pain or shortness of breath. The review of systems otherwise negative.    GENERAL: Sleeping but easily arousable. She is in no acute distress.  HEENT: Supple. Significant bite injury involving the lower lip on the right side. There is also bilateral tongue bite injuries more in the right side.   ABDOMEN: soft  EXTREMITIES: Mild swelling of the right ring finger. This is associated with some arthritic changes along with some arthritic changes of the other fingers.  BACK: Normal.  SKIN: Normal by inspection.    MENTAL STATUS: Alert and oriented. Speech, language and cognition are generally intact. Judgment and insight normal.   CRANIAL NERVES: Pupils are equal, round and reactive to light and accommodation; extra ocular movements are full, there is no significant nystagmus; visual fields are full; upper and lower facial muscles are normal in strength and symmetric, there is no flattening of the nasolabial folds; tongue is midline; uvula is midline; shoulder elevation is normal.  MOTOR: Normal tone, bulk and strength; no pronator drift.  COORDINATION: Left finger to nose is normal, right finger to nose is normal, No rest tremor; no intention tremor; no postural tremor; no bradykinesia.  REFLEXES: Deep tendon reflexes are symmetrical and normal. Babinski reflexes are flexor bilaterally.  SENSATION: Normal to light touch.      Blood pressure 123/77, pulse 85, temperature 98.4 F (36.9 C), temperature source Oral, resp. rate 20, height 5' 2" (1.575 m), weight 176 lb 14.4 oz (80.241 kg), SpO2 98 %.  Past Medical History  Diagnosis Date  . Seizures ([place])   . Depression   . Back pain   . Genital warts     herpes  . Herpes   . Chronic headaches     Past Surgical History    Procedure Laterality Date  . Cesarean section      x 3    History reviewed. No pertinent family history.  Social History:  reports that she has been smoking Cigarettes.  She has a 10 pack-year smoking history. She has never used smokeless tobacco. She reports that she drinks about 1.2 oz of alcohol per week. She reports that she uses illicit drugs (Marijuana).  Allergies:  Allergies  Allergen Reactions  . Morphine Other (See Comments)    Unknown Reaction     Medications: Prior to Admission medications   Medication Sig Start Date End Date Taking? Authorizing Provider  famciclovir (FAMVIR) 250 MG tablet Take 1 tablet (250 mg total) by mouth 2 (two) times daily. 11/07/15  Yes Fayrene Helper, MD  HYDROcodone-acetaminophen (NORCO) 7.5-325 MG per tablet Take 1 tablet by mouth daily as needed for moderate pain.  11/28/14  Yes Historical Provider, MD  ibuprofen (ADVIL,MOTRIN) 800 MG tablet Take 1 tablet (800 mg total) by mouth 3 (three) times daily. 12/26/14  Yes Noemi Chapel, MD  LamoTRIgine 250 MG TB24 Takes 2 tabs at bedtime. 08/11/14  Yes Historical Provider, MD  medroxyPROGESTERone (DEPO-PROVERA) 150 MG/ML injection INJECT 1 ML IN THE MUSCLE ONCE EVERY 3 MONTHS AS DIRECTED 08/15/15  Yes Estill Dooms, NP  methocarbamol (ROBAXIN) 500 MG tablet Take 1 tablet by mouth 2 (two) times daily as needed for muscle spasms.  09/26/14  Yes Historical Provider, MD  mirtazapine (REMERON) 30 MG tablet Take 1 tablet (30 mg total) by mouth at bedtime. 09/20/15  Yes Fayrene Helper, MD  rizatriptan (MAXALT) 10 MG tablet Take 10 mg by mouth as needed for migraine. Reported on 09/20/2015   Yes Historical Provider, MD    Scheduled Meds: . LamoTRIgine  500 mg Oral QHS  . levETIRAcetam  500 mg Intravenous Q12H  . mirtazapine  30 mg Oral QHS  . nicotine  14 mg Transdermal Daily  . Perampanel  10 mg Oral Daily   Continuous Infusions: . sodium chloride 75 mL/hr (11/19/15 1748)   PRN Meds:.acetaminophen  **OR** acetaminophen, LORazepam, methocarbamol, polyethylene glycol     Results for orders placed or performed during the hospital encounter of 11/19/15 (from the past 48 hour(s))  CBC     Status: None   Collection Time: 11/19/15  1:53 PM  Result Value Ref Range   WBC 8.7 4.0 - 10.5 K/uL   RBC 4.43 3.87 - 5.11 MIL/uL   Hemoglobin 14.2 12.0 - 15.0 g/dL   HCT 42.9 36.0 - 46.0 %   MCV 96.8 78.0 - 100.0 fL   MCH 32.1 26.0 - 34.0 pg   MCHC 33.1 30.0 - 36.0 g/dL   RDW 13.8 11.5 - 15.5 %   Platelets 363 150 - 400 K/uL  Basic metabolic panel     Status: Abnormal   Collection Time: 11/19/15  1:53 PM  Result Value Ref Range   Sodium 138 135 - 145 mmol/L   Potassium 4.0 3.5 -  5.1 mmol/L   Chloride 107 101 - 111 mmol/L   CO2 23 22 - 32 mmol/L   Glucose, Bld 104 (H) 65 - 99 mg/dL   BUN 12 6 - 20 mg/dL   Creatinine, Ser 0.87 0.44 - 1.00 mg/dL   Calcium 9.0 8.9 - 10.3 mg/dL   GFR calc non Af Amer >60 >60 mL/min   GFR calc Af Amer >60 >60 mL/min    Comment: (NOTE) The eGFR has been calculated using the CKD EPI equation. This calculation has not been validated in all clinical situations. eGFR's persistently <60 mL/min signify possible Chronic Kidney Disease.    Anion gap 8 5 - 15  Magnesium     Status: None   Collection Time: 11/19/15  1:53 PM  Result Value Ref Range   Magnesium 1.8 1.7 - 2.4 mg/dL  Urine rapid drug screen (hosp performed)not at [facility]     Status: Abnormal   Collection Time: 11/20/15  7:00 AM  Result Value Ref Range   Opiates NONE DETECTED NONE DETECTED   Cocaine NONE DETECTED NONE DETECTED   Benzodiazepines POSITIVE (A) NONE DETECTED   Amphetamines NONE DETECTED NONE DETECTED   Tetrahydrocannabinol POSITIVE (A) NONE DETECTED   Barbiturates NONE DETECTED NONE DETECTED    Comment:        DRUG SCREEN FOR MEDICAL PURPOSES ONLY.  IF CONFIRMATION IS NEEDED FOR ANY PURPOSE, NOTIFY LAB WITHIN 5 DAYS.        LOWEST DETECTABLE LIMITS FOR URINE DRUG SCREEN Drug Class        Cutoff (ng/mL) Amphetamine      1000 Barbiturate      200 Benzodiazepine   539 Tricyclics       767 Opiates          300 Cocaine          300 THC              50     Studies/Results:      A. Merlene Laughter, M.D.  Diplomate, Tax adviser of Psychiatry and Neurology ( Neurology). 11/20/2015, 8:58 AM

## 2015-11-20 NOTE — Care Management Note (Signed)
Case Management Note  Patient Details  Name: Jeannetta NapDorothy M Kallenberger MRN: [id number] Date of Birth: 07-01-1981  Subjective/Objective:                  Pt admitted for seizures. Pt is from home with strong family support. Pt is ind with ADL's. Pt has PCP, transportation and no difficulty obtaining medications. Pt plans to return home with self care.  Action/Plan: Discharging home today. No CM needs.   Expected Discharge Date:     11/20/2015             Expected Discharge Plan:  Home/Self Care  In-House Referral:  NA  Discharge planning Services  CM Consult  Post Acute Care Choice:  NA Choice offered to:  NA  DME Arranged:    DME Agency:     HH Arranged:    HH Agency:     Status of Service:  Completed, signed off  Medicare Important Message Given:    Date Medicare IM Given:    Medicare IM give by:    Date Additional Medicare IM Given:    Additional Medicare Important Message give by:     If discussed at Long Length of Stay Meetings, dates discussed:    Additional Comments:  Malcolm MetroChildress, Adrina Armijo Demske, RN 11/20/2015, 2:58 PM

## 2015-11-20 NOTE — Progress Notes (Deleted)
Report called to the [facility] [place], informed that patient would coming later today after unit of blood has finished infusing and a H&H has been drawn.

## 2015-11-20 NOTE — Discharge Summary (Signed)
Physician Discharge Summary  [name]:[id number] DOB: 1981/05/20 DOA: 11/19/2015  PCP: Syliva Overman, MD  Admit date: 11/19/2015 Discharge date: 11/20/2015  Time spent: Greater than 30 minutes  Recommendations for Outpatient Follow-up:  1. Patient was instructed to take her medications as prescribed. She was instructed to follow-up with Dr. Gerilyn Pilgrim. 2. She was instructed not to drive.     Discharge Diagnoses:  1. Seizure disorder with status epilepticus, generalized convulsive. 2. Post seizure headache. 3. Tobacco abuse. Patient was advised to stop smoking.    Discharge Condition: Improved.  Diet recommendation: Heart healthy.  Filed Weights   11/19/15 0931 11/19/15 1637  Weight: 72.576 kg (160 lb) 80.241 kg (176 lb 14.4 oz)    History of present illness:   [name] is a 35 y.o. female with a history of seizure disorder on lamictal. Per the patient, she is managed by Dr Gerilyn Pilgrim. She reported fairly frequent seizures, mostly at night when she is sleeping, and described a frequency of 2-3 times a week. Her seizures start with lip smacking, then progresses to tonic-clonic seizures. She had one episode at home with her kids on the day before admission and on the day of admission. EMS was called and brought her to the emergency department. In the ED, the patient had another witnessed seizure. CT of her head revealed no acute intracranial pathology. She was admitted for further evaluation and management.  Hospital Course:  Patient was given an IV bolus of Keppra and then started on it 500 mg twice a day. She was continued on Lamictal. Vigorous IV fluids were given for the first 24 hours. She complained of a headache and this was treated with analgesics as needed. She is a smoker. She was encouraged to stop smoking. Neurologist, Dr. Gerilyn Pilgrim was consulted. He reviewed the patient's anticonvulsant medication therapy with her. Apparently, she takes two 250 mg Lamictal  tablets at bedtime. She was instructed to continue this dose. She was prescribed Fycompa 10 mg daily over one year ago, but she admitted that she had not been taking it. The patient had her medication pill bottles and Fycompa, was one of them. She was resistant being discharged on yet another anticonvulsant medication, namely Keppra, although this was recommended by Dr. Gerilyn Pilgrim. Therefore, she was discharged on Lamictal and Fycompa. There were no witnessed seizures during the hospital course. She was instructed to follow-up with Dr. Gerilyn Pilgrim as scheduled in a few weeks.  Procedures:  None  Consultations:  Neurology, Dr. Gerilyn Pilgrim  Discharge Exam: Filed Vitals:   11/20/15 0520 11/20/15 1300  BP: 123/77 126/75  Pulse: 85 76  Temp: 98.4 F (36.9 C) 98.1 F (36.7 C)  Resp: 20 20    General:35 year old African-American woman in no acute distress. Cardiovascular: S1, S2, no murmurs rubs or gallops. Respiratory: Clear to auscultation bilaterally. Neurologic/psychiatric: She is alert and oriented 3. Her speech is clear.   Discharge Instructions   Discharge Instructions    Diet general    Complete by:  As directed      Discharge instructions    Complete by:  As directed   Take medications as prescribed. Do not drive. Follow-up with Dr. Gerilyn Pilgrim as scheduled.     Increase activity slowly    Complete by:  As directed           Current Discharge Medication List    START taking these medications   Details  Perampanel (FYCOMPA) 10 MG TABS Take 10 mg by mouth daily.  CONTINUE these medications which have NOT CHANGED   Details  famciclovir (FAMVIR) 250 MG tablet Take 1 tablet (250 mg total) by mouth 2 (two) times daily. Qty: 60 tablet, Refills: 0    HYDROcodone-acetaminophen (NORCO) 7.5-325 MG per tablet Take 1 tablet by mouth daily as needed for moderate pain.  Refills: 0    ibuprofen (ADVIL,MOTRIN) 800 MG tablet Take 1 tablet (800 mg total) by mouth 3 (three) times  daily. Qty: 21 tablet, Refills: 0    LamoTRIgine 250 MG TB24 Takes 2 tabs at bedtime. Refills: 2    medroxyPROGESTERone (DEPO-PROVERA) 150 MG/ML injection INJECT 1 ML IN THE MUSCLE ONCE EVERY 3 MONTHS AS DIRECTED Qty: 1 mL, Refills: 1    methocarbamol (ROBAXIN) 500 MG tablet Take 1 tablet by mouth 2 (two) times daily as needed for muscle spasms.  Refills: 2    mirtazapine (REMERON) 30 MG tablet Take 1 tablet (30 mg total) by mouth at bedtime. Qty: 30 tablet, Refills: 3    rizatriptan (MAXALT) 10 MG tablet Take 10 mg by mouth as needed for migraine. Reported on 09/20/2015       Allergies  Allergen Reactions  . Morphine Other (See Comments)    Unknown Reaction    Follow-up Information    Follow up with [facility], KOFI, MD.   Specialty:  Neurology   Why:  Follow-up as scheduled.   Contact information:   [address] [place] [address] [phone]        The results of significant diagnostics from this hospitalization (including imaging, microbiology, ancillary and laboratory) are listed below for reference.    Significant Diagnostic Studies: Ct Head Wo Contrast  11/19/2015  CLINICAL DATA:  Seizure yesterday and had another seizure this am. EMS reports pt was standing up on arrival on scene alert but disoriented. PT c/o headache on arrival to ED. EXAM: CT HEAD WITHOUT CONTRAST TECHNIQUE: Contiguous axial images were obtained from the base of the skull through the vertex without intravenous contrast. COMPARISON:  04/11/2014 FINDINGS: There is no evidence of mass effect, midline shift or extra-axial fluid collections. There is no evidence of a space-occupying lesion or intracranial hemorrhage. There is no evidence of a cortical-based area of acute infarction. The ventricles and sulci are appropriate for the patient's age. The basal cisterns are patent. Visualized portions of the orbits are unremarkable. The visualized portions of the paranasal sinuses and mastoid air cells  are unremarkable. The osseous structures are unremarkable. IMPRESSION: No acute intracranial pathology. Electronically Signed   By: Elige Ko   On: 11/19/2015 15:11    Microbiology: No results found for this or any previous visit (from the past 240 hour(s)).   Labs: Basic Metabolic Panel:  Recent Labs Lab 11/19/15 1353  NA 138  K 4.0  CL 107  CO2 23  GLUCOSE 104*  BUN 12  CREATININE 0.87  CALCIUM 9.0  MG 1.8   Liver Function Tests: No results for input(s): AST, ALT, ALKPHOS, BILITOT, PROT, ALBUMIN in the last 168 hours. No results for input(s): LIPASE, AMYLASE in the last 168 hours. No results for input(s): AMMONIA in the last 168 hours. CBC:  Recent Labs Lab 11/19/15 1353  WBC 8.7  HGB 14.2  HCT 42.9  MCV 96.8  PLT 363   Cardiac Enzymes: No results for input(s): CKTOTAL, CKMB, CKMBINDEX, TROPONINI in the last 168 hours. BNP: BNP (last 3 results) No results for input(s): BNP in the last 8760 hours.  ProBNP (last 3 results) No results  for input(s): PROBNP in the last 8760 hours.  CBG: No results for input(s): GLUCAP in the last 168 hours.     Signed:  Onaje Warne MD.  [facility] 11/20/2015, 2:33 PM

## 2015-11-20 NOTE — Progress Notes (Signed)
[name] discharged Home per MD order.  Discharge instructions reviewed and discussed with the patient, all questions and concerns answered. Copy of instructions given to patient.    Medication List    TAKE these medications        famciclovir 250 MG tablet  Commonly known as:  FAMVIR  Take 1 tablet (250 mg total) by mouth 2 (two) times daily.     HYDROcodone-acetaminophen 7.5-325 MG tablet  Commonly known as:  NORCO  Take 1 tablet by mouth daily as needed for moderate pain.     ibuprofen 800 MG tablet  Commonly known as:  ADVIL,MOTRIN  Take 1 tablet (800 mg total) by mouth 3 (three) times daily.     LamoTRIgine 250 MG Tb24  Takes 2 tabs at bedtime.     medroxyPROGESTERone 150 MG/ML injection  Commonly known as:  DEPO-PROVERA  INJECT 1 ML IN THE MUSCLE ONCE EVERY 3 MONTHS AS DIRECTED     methocarbamol 500 MG tablet  Commonly known as:  ROBAXIN  Take 1 tablet by mouth 2 (two) times daily as needed for muscle spasms.     mirtazapine 30 MG tablet  Commonly known as:  REMERON  Take 1 tablet (30 mg total) by mouth at bedtime.     Perampanel 10 MG Tabs  Commonly known as:  FYCOMPA  Take 10 mg by mouth daily.     rizatriptan 10 MG tablet  Commonly known as:  MAXALT  Take 10 mg by mouth as needed for migraine. Reported on 09/20/2015        Patients skin is clean, dry and intact, no evidence of skin break down. IV site discontinued and catheter remains intact. Site without signs and symptoms of complications. Dressing and pressure applied.  Patient escorted to car by NT in a wheelchair,  no distress noted upon discharge.  Rica KoyanagiBonnie M Porscha Axley 11/20/2015 2:54 PM

## 2015-11-21 LAB — LAMOTRIGINE LEVEL: Lamotrigine Lvl: 3.4 ug/mL (ref 2.0–20.0)

## 2015-12-07 DIAGNOSIS — G4459 Other complicated headache syndrome: Secondary | ICD-10-CM | POA: Diagnosis not present

## 2015-12-07 DIAGNOSIS — Z79899 Other long term (current) drug therapy: Secondary | ICD-10-CM | POA: Diagnosis not present

## 2015-12-07 DIAGNOSIS — F17200 Nicotine dependence, unspecified, uncomplicated: Secondary | ICD-10-CM | POA: Diagnosis not present

## 2015-12-07 DIAGNOSIS — G40919 Epilepsy, unspecified, intractable, without status epilepticus: Secondary | ICD-10-CM | POA: Diagnosis not present

## 2015-12-07 DIAGNOSIS — G40909 Epilepsy, unspecified, not intractable, without status epilepticus: Secondary | ICD-10-CM | POA: Diagnosis not present

## 2015-12-07 DIAGNOSIS — G43909 Migraine, unspecified, not intractable, without status migrainosus: Secondary | ICD-10-CM | POA: Diagnosis not present

## 2016-01-11 ENCOUNTER — Telehealth: Payer: Self-pay | Admitting: Family Medicine

## 2016-01-11 NOTE — Telephone Encounter (Signed)
Please advise 

## 2016-01-11 NOTE — Telephone Encounter (Addendum)
[name] is calling stating that she is not wanting to see Dr. Gerilyn Pilgrimoonquah any longer she says she cant get anywhere with him, she is asking if there is another Dr. Claiborne Billingshat would take over her care for seizures, please advise?

## 2016-01-11 NOTE — Telephone Encounter (Signed)
When pt request , then OK to arrange change , [place], neurology for uncontrolled seizure disorder, she needs to hear she will need to travel outside of [place] and keep all appts [place], [place] , may need to look at [facility], Pls also ask her directly if she will rreconsider and stay with Dr Gerilyn Pilgrimoonquah , i will sne d him a note of her concerns if she agrees ion this , let me know

## 2016-01-16 NOTE — Telephone Encounter (Signed)
Called patient and left message for them to return call at the office   

## 2016-01-22 ENCOUNTER — Emergency Department (HOSPITAL_COMMUNITY)
Admission: EM | Admit: 2016-01-22 | Discharge: 2016-01-22 | Disposition: A | Discharge status: Home / Self Care | Payer: Medicare Other | Attending: Emergency Medicine | Admitting: Emergency Medicine

## 2016-01-22 ENCOUNTER — Encounter (HOSPITAL_COMMUNITY): Payer: Self-pay | Admitting: *Deleted

## 2016-01-22 ENCOUNTER — Emergency Department (HOSPITAL_COMMUNITY)
Admission: EM | Admit: 2016-01-22 | Discharge: 2016-01-22 | Disposition: A | Discharge status: Home / Self Care | Payer: Medicare Other | Source: Home / Self Care | Attending: Emergency Medicine | Admitting: Emergency Medicine

## 2016-01-22 ENCOUNTER — Encounter (HOSPITAL_COMMUNITY): Payer: Self-pay

## 2016-01-22 DIAGNOSIS — R569 Unspecified convulsions: Secondary | ICD-10-CM

## 2016-01-22 DIAGNOSIS — R51 Headache: Secondary | ICD-10-CM | POA: Diagnosis not present

## 2016-01-22 DIAGNOSIS — F17210 Nicotine dependence, cigarettes, uncomplicated: Secondary | ICD-10-CM

## 2016-01-22 DIAGNOSIS — F329 Major depressive disorder, single episode, unspecified: Secondary | ICD-10-CM

## 2016-01-22 DIAGNOSIS — G40909 Epilepsy, unspecified, not intractable, without status epilepticus: Secondary | ICD-10-CM | POA: Insufficient documentation

## 2016-01-22 DIAGNOSIS — S0993XA Unspecified injury of face, initial encounter: Secondary | ICD-10-CM | POA: Diagnosis not present

## 2016-01-22 DIAGNOSIS — Z79899 Other long term (current) drug therapy: Secondary | ICD-10-CM | POA: Diagnosis not present

## 2016-01-22 DIAGNOSIS — F0391 Unspecified dementia with behavioral disturbance: Secondary | ICD-10-CM | POA: Diagnosis not present

## 2016-01-22 DIAGNOSIS — F4489 Other dissociative and conversion disorders: Secondary | ICD-10-CM | POA: Diagnosis not present

## 2016-01-22 LAB — BASIC METABOLIC PANEL
ANION GAP: 7 (ref 5–15)
BUN: 10 mg/dL (ref 6–20)
CALCIUM: 8.8 mg/dL — AB (ref 8.9–10.3)
CHLORIDE: 109 mmol/L (ref 101–111)
CO2: 23 mmol/L (ref 22–32)
Creatinine, Ser: 0.98 mg/dL (ref 0.44–1.00)
GFR calc non Af Amer: >60 mL/min (ref >60)
Glucose, Bld: 94 mg/dL (ref 65–99)
Potassium: 3.9 mmol/L (ref 3.5–5.1)
SODIUM: 139 mmol/L (ref 135–145)

## 2016-01-22 LAB — CBC
HEMATOCRIT: 44.1 % (ref 36.0–46.0)
HEMOGLOBIN: 14.9 g/dL (ref 12.0–15.0)
MCH: 32.7 pg (ref 26.0–34.0)
MCHC: 33.8 g/dL (ref 30.0–36.0)
MCV: 96.7 fL (ref 78.0–100.0)
Platelets: 361 10*3/uL (ref 150–400)
RBC: 4.56 MIL/uL (ref 3.87–5.11)
RDW: 14 % (ref 11.5–15.5)
WBC: 7.1 10*3/uL (ref 4.0–10.5)

## 2016-01-22 MED ORDER — IBUPROFEN 400 MG PO TABS
400.0000 mg | ORAL_TABLET | Freq: Once | ORAL | Status: AC
  Administered 2016-01-22: 400 mg via ORAL
  Filled 2016-01-22: qty 1

## 2016-01-22 MED ORDER — LEVETIRACETAM 500 MG PO TABS: 500.0000 mg | ORAL_TABLET | Freq: Two times a day (BID) | ORAL | Status: DC

## 2016-01-22 MED ORDER — LEVETIRACETAM IN NACL 1000 MG/100ML IV SOLN
1000.0000 mg | Freq: Once | INTRAVENOUS | Status: AC
Start: 2016-01-22 — End: 2016-01-22
  Administered 2016-01-22: 1000 mg via INTRAVENOUS
  Filled 2016-01-22: qty 100

## 2016-01-22 NOTE — Telephone Encounter (Signed)
Called patient and left message for them to return call at the office   

## 2016-01-22 NOTE — Discharge Instructions (Signed)
Ms. [name],  Please continue to take your lamictal as usual, including a dose tonight.   I have prescribed keppra to take if seizure frequency increases. You do not need to fill this prescription if you have no more seizures. Otherwise, continue taking just your lamictal and fycompa as before.  Do not drive yourself until cleared by Neurology.

## 2016-01-22 NOTE — Discharge Instructions (Signed)
Epilepsy °People with epilepsy have times when they shake and jerk uncontrollably (seizures). This happens when there is a sudden change in brain function. Epilepsy may have many possible causes. Anything that disturbs the normal pattern of brain cell activity can lead to seizures. °HOME CARE  °· Follow your doctor's instructions about driving and safety during normal activities. °· Get enough sleep. °· Only take medicine as told by your doctor. °· Avoid things that you know can cause you to have seizures (triggers). °· Write down when your seizures happen and what you remember about each seizure. Write down anything you think may have caused the seizure to happen. °· Tell the people you live and work with that you have seizures. Make sure they know how to help you. They should: °¨ Cushion your head and body. °¨ Turn you on your side. °¨ Not restrain you. °¨ Not place anything inside your mouth. °¨ Call for local emergency medical help if there is any question about what has happened. °· Keep all follow-up visits with your doctor. This is very important. °GET HELP IF: °· You get an infection or start to feel sick. You may have more seizures when you are sick. °· You are having seizures more often. °· Your seizure pattern is changing. °GET HELP RIGHT AWAY IF:  °· A seizure does not stop after a few seconds or minutes. °· A seizure causes you to have trouble breathing. °· A seizure gives you a very bad headache. °· A seizure makes you unable to speak or use a part of your body. °  °This information is not intended to replace advice given to you by your health care provider. Make sure you discuss any questions you have with your health care provider. °  °Document Released: 05/05/2009 Document Revised: 04/28/2013 Document Reviewed: 02/17/2013 °Elsevier Interactive Patient Education ©2016 [employer] ° °

## 2016-01-22 NOTE — ED Notes (Signed)
Per ems called out for seizure like activity, pt bleeding from mouth and disoriented upon EMS arrival. Pt A&O at this time.

## 2016-01-22 NOTE — ED Provider Notes (Signed)
CSN: [id number]     Arrival date & time 01/22/16  [id number] History   First MD Initiated Contact with Patient 01/22/16 [phone]     Chief Complaint  Patient presents with  . Seizures   (Consider location/radiation/quality/duration/timing/severity/associated sxs/prior Treatment) HPI  [name] is a 34-y/o female with history of seizure disorder who presents after a seizure this morning. Daughter heard her mother making a noise and saw her shaking in bed. Son says her arms and legs were tense, and her whole body was shaking. Unclear how long seizure lasted. Pt did not fall out of bed, hit her head, loose control of her bladder or bowels, but she did bite her tongue. She does not remember having the seizure. The last thing she remembers is being brought to the hospital. She felt confused directly after the event but feels back to her baseline now. Medical record indicates patient tends to have seizures in her sleep, up to 2-3 times a week. Patient denies having had a seizure since last ED visit 11/19/15. Pt denies trouble taking her medications and reports taking lamictal and "another tablet" that was started after her last admission--presumably fycompa 10 mg. She reports she last saw her neurologist last month and no medication changes were made. She denies any recent illnesses.    Past Medical History  Diagnosis Date  . Seizures (HCC)   . Depression   . Back pain   . Genital warts     herpes  . Herpes   . Chronic headaches    Past Surgical History  Procedure Laterality Date  . Cesarean section      x 3   History reviewed. No pertinent family history. Social History  Substance Use Topics  . Smoking status: Current Every Day Smoker -- 1.00 packs/day for 10 years    Types: Cigarettes  . Smokeless tobacco: Never Used  . Alcohol Use: 1.2 oz/week    2 Cans of beer per week     Comment: occasionally   OB History    Gravida Para Term Preterm AB TAB SAB Ectopic Multiple Living   3 3        2       Review of Systems  Constitutional: Negative for fever, activity change and appetite change.  HENT: Negative for congestion and rhinorrhea.   Eyes: Negative for visual disturbance.  Respiratory: Negative for shortness of breath.   Cardiovascular: Negative for chest pain.  Gastrointestinal: Negative for nausea and vomiting.  Genitourinary: Negative for dysuria.  Musculoskeletal: Negative for neck stiffness.  Skin: Positive for wound.  Neurological: Positive for headaches. Negative for weakness.  Psychiatric/Behavioral: Positive for confusion.    Allergies  Morphine  Home Medications   Prior to Admission medications   Medication Sig Start Date End Date Taking? Authorizing Provider  famciclovir (FAMVIR) 250 MG tablet Take 1 tablet (250 mg total) by mouth 2 (two) times daily. 11/07/15   Kerri PerchesMargaret E Simpson, MD  HYDROcodone-acetaminophen (NORCO) 7.5-325 MG per tablet Take 1 tablet by mouth daily as needed for moderate pain.  11/28/14   Historical Provider, MD  ibuprofen (ADVIL,MOTRIN) 800 MG tablet Take 1 tablet (800 mg total) by mouth 3 (three) times daily. 12/26/14   Eber HongBrian Miller, MD  LamoTRIgine 250 MG TB24 Takes 2 tabs at bedtime. 08/11/14   Historical Provider, MD  medroxyPROGESTERone (DEPO-PROVERA) 150 MG/ML injection INJECT 1 ML IN THE MUSCLE ONCE EVERY 3 MONTHS AS DIRECTED 08/15/15   Adline PotterJennifer A Griffin, NP  methocarbamol (ROBAXIN) 500 MG tablet  Take 1 tablet by mouth 2 (two) times daily as needed for muscle spasms.  09/26/14   Historical Provider, MD  mirtazapine (REMERON) 30 MG tablet Take 1 tablet (30 mg total) by mouth at bedtime. 09/20/15   Kerri PerchesMargaret E Simpson, MD  Perampanel (FYCOMPA) 10 MG TABS Take 10 mg by mouth daily. 11/20/15   Elliot Cousinenise Fisher, MD  rizatriptan (MAXALT) 10 MG tablet Take 10 mg by mouth as needed for migraine. Reported on 09/20/2015    Historical Provider, MD   BP 117/84 mmHg  Pulse 83  Temp(Src) 98.1 F (36.7 C) (Oral)  Resp 27  Ht 5\' 1"  (1.549 m)  Wt 68.04 kg   BMI 28.36 kg/m2  SpO2 98% Physical Exam  Constitutional: She is oriented to person, place, and time. She appears well-developed and well-nourished. No distress.  HENT:  Head: Normocephalic and atraumatic.  Nose: Nose normal.  Mouth/Throat: Oropharynx is clear and moist.  Laceration to left lateral tongue with minimal bleeding.   Eyes: Conjunctivae and EOM are normal. Pupils are equal, round, and reactive to light.  Neck: Normal range of motion. Neck supple.  Cardiovascular: Normal rate, regular rhythm and normal heart sounds.   No murmur heard. Pulmonary/Chest: Effort normal and breath sounds normal. No respiratory distress. She has no wheezes.  Abdominal: Soft. Bowel sounds are normal. She exhibits no distension. There is no tenderness. There is no rebound and no guarding.  Musculoskeletal: Normal range of motion. She exhibits no edema or tenderness.  Neurological: She is alert and oriented to person, place, and time. No cranial nerve deficit. Coordination normal.  Skin: Skin is warm and dry. She is not diaphoretic.  Psychiatric: She has a normal mood and affect. Her behavior is normal.   ED Course  Procedures (including critical care time) Labs Review Labs Reviewed  BASIC METABOLIC PANEL - Abnormal; Notable for the following:    Calcium 8.8 (*)    All other components within normal limits  CBC   Imaging Review No results found. I have personally reviewed and evaluated these images and lab results as part of my medical decision-making.   EKG Interpretation   Date/Time:  Monday January 22 2016 06:55:35 EDT Ventricular Rate:  98 PR Interval:    QRS Duration: 85 QT Interval:  347 QTC Calculation: 443 R Axis:   59 Text Interpretation:  Sinus rhythm Borderline T abnormalities, diffuse  leads No significant change since last tracing since 2010 Confirmed by  WARD,  DO, KRISTEN ([id number]) on 01/22/2016 6:59:51 AM      MDM   Final diagnoses:  Seizure (HCC)   Pt presented after  seizure. She was initially confused after the episode but quickly improved and was AOx3 upon arrival. EKG, BMP normal. Pt was observed for over an hour without further events. Advised close follow-up with outpatient  Recommended salt water gargles to encourage tongue healing.  Dani GobbleHillary Moishe Schellenberg, MD [facility], PGY-2   [facility] Moen Ndrew Creason, MD 01/22/16 [id number]  Blane OharaJoshua Zavitz, MD 01/23/16 2204

## 2016-01-22 NOTE — ED Notes (Signed)
Pt taking home medication, her family just picked up from pharmacy, pt has not had in 2 days, taking Lamictal XR250mg , 2 pills PO,  Med student and family at the bedside to confirm

## 2016-01-22 NOTE — ED Notes (Signed)
Pt was seen here this morning for seizures was discharged. Pt went home and didn't get her medication filled. EMS states she was post ictal upon arrival. Pt is alert and oriented upon arrival.   CBG 118

## 2016-01-22 NOTE — Telephone Encounter (Signed)
Have left multiple messages. Will await a call back

## 2016-01-22 NOTE — ED Notes (Signed)
Children state mother was in bed at 6:30, they heard her make a noise and went in a she was having a seizure, stiff, no fall, or vomiting, pt is alert and oriented, neuro intact

## 2016-01-22 NOTE — ED Notes (Signed)
Patient given discharge instruction, verbalized understand. IV removed, band aid applied. Patient ambulatory out of the department.  

## 2016-01-22 NOTE — ED Provider Notes (Signed)
CSN: [id number]     Arrival date & time 01/22/16  1059 History   First MD Initiated Contact with Patient 01/22/16 1101     Chief Complaint  Patient presents with  . Seizures     (Consider location/radiation/quality/duration/timing/severity/associated sxs/prior Treatment) HPI  [name] is a 34-y/o female who presents after a second seizure today after presenting earlier to the ED and having been discharged after an hour of supervision. She returned home and went to rest in her bed and had another full-body seizure that lasted about 3-5 minutes per her son. Pt reports she had missed 2 days' worth of lamictal, as the pharmacy did not have it in stock when she went for refills. She has been continuing to take fycompa. Pt has lamictal prescription at bedside now, as her mother went to the pharmacy and picked up the prescription. Pt denies financial barriers to picking up her medication. She reports having missed doses of lamictal once before, again because it had not been in stock at pharmacy.   Past Medical History  Diagnosis Date  . Seizures (HCC)   . Depression   . Back pain   . Genital warts     herpes  . Herpes   . Chronic headaches    Past Surgical History  Procedure Laterality Date  . Cesarean section      x 3   No family history on file. Social History  Substance Use Topics  . Smoking status: Current Every Day Smoker -- 1.00 packs/day for 10 years    Types: Cigarettes  . Smokeless tobacco: Never Used  . Alcohol Use: 1.2 oz/week    2 Cans of beer per week     Comment: occasionally   OB History    Gravida Para Term Preterm AB TAB SAB Ectopic Multiple Living   3 3        2      Review of Systems  Constitutional: Negative for fever and chills.  HENT: Negative for congestion and sore throat.   Respiratory: Negative for cough and wheezing.   Cardiovascular: Negative for chest pain and palpitations.  Gastrointestinal: Negative for nausea and abdominal pain.   Genitourinary: Negative for dysuria.  Musculoskeletal: Negative for neck pain and neck stiffness.  Neurological: Positive for headaches. Negative for weakness.   Allergies  Morphine  Home Medications   Prior to Admission medications   Medication Sig Start Date End Date Taking? Authorizing Provider  LamoTRIgine 250 MG TB24 Take 2 tablets by mouth at bedtime.   Yes Historical Provider, MD  famciclovir (FAMVIR) 250 MG tablet Take 1 tablet (250 mg total) by mouth 2 (two) times daily. 11/07/15   Kerri PerchesMargaret E Simpson, MD  HYDROcodone-acetaminophen (NORCO) 7.5-325 MG per tablet Take 1 tablet by mouth daily as needed for moderate pain.  11/28/14   Historical Provider, MD  ibuprofen (ADVIL,MOTRIN) 800 MG tablet Take 1 tablet (800 mg total) by mouth 3 (three) times daily. 12/26/14   Eber HongBrian Miller, MD  LamoTRIgine 250 MG TB24 Takes 2 tabs at bedtime. 08/11/14   Historical Provider, MD  levETIRAcetam (KEPPRA) 500 MG tablet Take 1 tablet (500 mg total) by mouth 2 (two) times daily. 01/22/16   Hillary Percell BostonMoen Fitzgerald, MD  medroxyPROGESTERone (DEPO-PROVERA) 150 MG/ML injection INJECT 1 ML IN THE MUSCLE ONCE EVERY 3 MONTHS AS DIRECTED 08/15/15   Adline PotterJennifer A Griffin, NP  methocarbamol (ROBAXIN) 500 MG tablet Take 1 tablet by mouth 2 (two) times daily as needed for muscle spasms.  09/26/14  Historical Provider, MD  mirtazapine (REMERON) 30 MG tablet Take 1 tablet (30 mg total) by mouth at bedtime. 09/20/15   Kerri PerchesMargaret E Simpson, MD  Perampanel (FYCOMPA) 10 MG TABS Take 10 mg by mouth daily. 11/20/15   Elliot Cousinenise Fisher, MD  rizatriptan (MAXALT) 10 MG tablet Take 10 mg by mouth as needed for migraine. Reported on 09/20/2015    Historical Provider, MD   BP 129/80 mmHg  Pulse 89  Temp(Src) 98.6 F (37 C) (Oral)  Resp 16  Wt 68.04 kg  SpO2 98% Physical Exam  Constitutional: She is oriented to person, place, and time. She appears well-developed and well-nourished. No distress.  HENT:  Head: Normocephalic and atraumatic.  Nose:  Nose normal.  Mouth/Throat: Oropharynx is clear and moist.  Eyes: Conjunctivae and EOM are normal. Pupils are equal, round, and reactive to light.  Neck: Normal range of motion. Neck supple.  Cardiovascular: Normal rate, regular rhythm and normal heart sounds.   No murmur heard. Pulmonary/Chest: Effort normal and breath sounds normal. No respiratory distress.  Abdominal: Soft. Bowel sounds are normal. She exhibits no distension. There is no tenderness. There is no rebound and no guarding.  Musculoskeletal: Normal range of motion. She exhibits no edema.  Neurological: She is alert and oriented to person, place, and time. No cranial nerve deficit. Coordination normal.  Skin: Skin is warm and dry. No rash noted. She is not diaphoretic. No erythema.  Psychiatric: She has a normal mood and affect. Her behavior is normal.   ED Course  Procedures (including critical care time) Labs Review Labs Reviewed - No data to display  Imaging Review No results found. I have personally reviewed and evaluated these images and lab results as part of my medical decision-making.   EKG Interpretation None      MDM   Final diagnoses:  Seizures (HCC)   Pt presents with second seizure of today in setting of missing two days' of lamictal. She was given 1 g of keppra and instructed to take her home 500 mg lamictal. She was advised to resume her normal schedule of taking lamictal this evening. She was given a prescription for Keppra 500 mg BID to fill if seizures continue to occur despite resuming normal therapy. She was observed for over 2 hours without further seizure episodes. Her Neurologist's office was made aware of her visits today. She has scheduled follow-up on 02/05/16.   Dani GobbleHillary Fitzgerald, MD [facility], PGY-2    [facility] Moen Fitzgerald, MD 01/22/16 1607  Blane OharaJoshua Zavitz, MD 01/23/16 2204

## 2016-01-25 ENCOUNTER — Other Ambulatory Visit: Payer: Self-pay

## 2016-01-25 ENCOUNTER — Ambulatory Visit: Payer: Medicare Other | Admitting: Family Medicine

## 2016-01-25 MED ORDER — FAMCICLOVIR 250 MG PO TABS: 250.0000 mg | ORAL_TABLET | Freq: Two times a day (BID) | ORAL | Status: DC

## 2016-02-01 ENCOUNTER — Ambulatory Visit: Payer: Medicare Other

## 2016-02-01 ENCOUNTER — Other Ambulatory Visit: Payer: Self-pay | Admitting: Adult Health

## 2016-02-01 ENCOUNTER — Telehealth: Payer: Self-pay | Admitting: *Deleted

## 2016-02-01 NOTE — Telephone Encounter (Signed)
Spoke with pt. Pt needs refills on Depo. Pt hasn't been seen since 02/03/13. I advised pt she needs to be seen for pap. Pt voiced understanding. Call was transferred to front desk for appt. Pt needs to be seen before 7/20. That's when next Depo is due. JSY

## 2016-02-02 ENCOUNTER — Ambulatory Visit: Payer: Medicare Other

## 2016-02-05 ENCOUNTER — Other Ambulatory Visit: Payer: Medicare Other | Admitting: Obstetrics and Gynecology

## 2016-02-06 ENCOUNTER — Ambulatory Visit: Payer: Medicare Other

## 2016-02-12 ENCOUNTER — Other Ambulatory Visit: Payer: Medicare Other | Admitting: Obstetrics & Gynecology

## 2016-02-15 ENCOUNTER — Telehealth: Payer: Self-pay

## 2016-02-15 NOTE — Telephone Encounter (Signed)
Fyi.

## 2016-02-16 ENCOUNTER — Ambulatory Visit (INDEPENDENT_AMBULATORY_CARE_PROVIDER_SITE_OTHER): Payer: Medicare Other | Admitting: Obstetrics & Gynecology

## 2016-02-16 ENCOUNTER — Encounter: Payer: Self-pay | Admitting: Obstetrics & Gynecology

## 2016-02-16 ENCOUNTER — Other Ambulatory Visit (HOSPITAL_COMMUNITY)
Admission: RE | Admit: 2016-02-16 | Discharge: 2016-02-16 | Disposition: A | Discharge status: Home / Self Care | Payer: Medicare Other | Source: Ambulatory Visit | Attending: Obstetrics & Gynecology | Admitting: Obstetrics & Gynecology

## 2016-02-16 VITALS — BP 140/80 | HR 76 | Ht 61.0 in | Wt 179.0 lb

## 2016-02-16 DIAGNOSIS — Z3202 Encounter for pregnancy test, result negative: Secondary | ICD-10-CM | POA: Diagnosis not present

## 2016-02-16 DIAGNOSIS — Z124 Encounter for screening for malignant neoplasm of cervix: Secondary | ICD-10-CM | POA: Diagnosis not present

## 2016-02-16 DIAGNOSIS — Z01419 Encounter for gynecological examination (general) (routine) without abnormal findings: Secondary | ICD-10-CM | POA: Insufficient documentation

## 2016-02-16 LAB — POCT URINE PREGNANCY: Preg Test, Ur: NEGATIVE

## 2016-02-16 MED ORDER — MEDROXYPROGESTERONE ACETATE 150 MG/ML IM SUSP: 150.0000 mg | INTRAMUSCULAR | 0 refills | Status: DC

## 2016-02-16 NOTE — Progress Notes (Signed)
Subjective:     [name] is a 35 y.o. female here for a routine exam.  No LMP recorded. Patient has had an injection. G3P3 Birth Control Method:  Depo provera Menstrual Calendar(currently): amenorrhiec  Current complaints: none.   Current acute medical issues:  none   Recent Gynecologic History No LMP recorded. Patient has had an injection. Last Pap: 2016,  normal Last mammogram: ,    Past Medical History:  Diagnosis Date  . Back pain   . Chronic headaches   . Depression   . Genital warts    herpes  . Herpes   . Seizures (HCC)     Past Surgical History:  Procedure Laterality Date  . CESAREAN SECTION     x 3    OB History    Gravida Para Term Preterm AB Living   3 3       2    SAB TAB Ectopic Multiple Live Births           2      Social History   Social History  . Marital status: Legally Separated    Spouse name: N/A  . Number of children: N/A  . Years of education: N/A   Social History Main Topics  . Smoking status: Current Every Day Smoker    Packs/day: 1.00    Years: 10.00    Types: Cigarettes  . Smokeless tobacco: Never Used  . Alcohol use 1.2 oz/week    2 Cans of beer per week     Comment: occasionally  . Drug use:     Types: Marijuana     Comment: 3 times a week  . Sexual activity: Yes    Birth control/ protection: Injection   Other Topics Concern  . None   Social History Narrative  . None    History reviewed. No pertinent family history.   Current Outpatient Prescriptions:  .  famciclovir (FAMVIR) 250 MG tablet, Take 1 tablet (250 mg total) by mouth 2 (two) times daily., Disp: 60 tablet, Rfl: 0 .  HYDROcodone-acetaminophen (NORCO) 7.5-325 MG per tablet, Take 1 tablet by mouth daily as needed for moderate pain. , Disp: , Rfl: 0 .  ibuprofen (ADVIL,MOTRIN) 800 MG tablet, Take 1 tablet (800 mg total) by mouth 3 (three) times daily., Disp: 21 tablet, Rfl: 0 .  LamoTRIgine 250 MG TB24, Takes 2 tabs at bedtime., Disp: , Rfl: 2 .   medroxyPROGESTERone (DEPO-PROVERA) 150 MG/ML injection, INJECT 1 ML IN THE MUSCLE ONCE EVERY 3 MONTHS AS DIRECTED, Disp: 1 mL, Rfl: 3 .  mirtazapine (REMERON) 30 MG tablet, Take 1 tablet (30 mg total) by mouth at bedtime., Disp: 30 tablet, Rfl: 3 .  Perampanel (FYCOMPA) 10 MG TABS, Take 10 mg by mouth daily., Disp: , Rfl:  .  rizatriptan (MAXALT) 10 MG tablet, Take 10 mg by mouth as needed for migraine. Reported on 09/20/2015, Disp: , Rfl:  .  medroxyPROGESTERone (DEPO-PROVERA) 150 MG/ML injection, Inject 1 mL (150 mg total) into the muscle every 3 (three) months., Disp: 1 mL, Rfl: 0  Review of Systems  Review of Systems  Constitutional: Negative for fever, chills, weight loss, malaise/fatigue and diaphoresis.  HENT: Negative for hearing loss, ear pain, nosebleeds, congestion, sore throat, neck pain, tinnitus and ear discharge.   Eyes: Negative for blurred vision, double vision, photophobia, pain, discharge and redness.  Respiratory: Negative for cough, hemoptysis, sputum production, shortness of breath, wheezing and stridor.   Cardiovascular: Negative for chest pain, palpitations, orthopnea,  claudication, leg swelling and PND.  Gastrointestinal: negative for abdominal pain. Negative for heartburn, nausea, vomiting, diarrhea, constipation, blood in stool and melena.  Genitourinary: Negative for dysuria, urgency, frequency, hematuria and flank pain.  Musculoskeletal: Negative for myalgias, back pain, joint pain and falls.  Skin: Negative for itching and rash.  Neurological: Negative for dizziness, tingling, tremors, sensory change, speech change, focal weakness, seizures, loss of consciousness, weakness and headaches.  Endo/Heme/Allergies: Negative for environmental allergies and polydipsia. Does not bruise/bleed easily.  Psychiatric/Behavioral: Negative for depression, suicidal ideas, hallucinations, memory loss and substance abuse. The patient is not nervous/anxious and does not have insomnia.         Objective:  Blood pressure 140/80, pulse 76, height  (1.549 m), weight 179 lb (81.2 kg).   Physical Exam  Vitals reviewed. Constitutional: She is oriented to person, place, and time. She appears well-developed and well-nourished.  HENT:  Head: Normocephalic and atraumatic.        Right Ear: External ear normal.  Left Ear: External ear normal.  Nose: Nose normal.  Mouth/Throat: Oropharynx is clear and moist.  Eyes: Conjunctivae and EOM are normal. Pupils are equal, round, and reactive to light. Right eye exhibits no discharge. Left eye exhibits no discharge. No scleral icterus.  Neck: Normal range of motion. Neck supple. No tracheal deviation present. No thyromegaly present.  Cardiovascular: Normal rate, regular rhythm, normal heart sounds and intact distal pulses.  Exam reveals no gallop and no friction rub.   No murmur heard. Respiratory: Effort normal and breath sounds normal. No respiratory distress. She has no wheezes. She has no rales. She exhibits no tenderness.  GI: Soft. Bowel sounds are normal. She exhibits no distension and no mass. There is no tenderness. There is no rebound and no guarding.  Genitourinary:  Breasts no masses skin changes or nipple changes bilaterally      Vulva is normal without lesions Vagina is pink moist without discharge Cervix normal in appearance and pap is done Uterus is normal size shape and contour Adnexa is negative with normal sized ovaries   Musculoskeletal: Normal range of motion. She exhibits no edema and no tenderness.  Neurological: She is alert and oriented to person, place, and time. She has normal reflexes. She displays normal reflexes. No cranial nerve deficit. She exhibits normal muscle tone. Coordination normal.  Skin: Skin is warm and dry. No rash noted. No erythema. No pallor.  Psychiatric: She has a normal mood and affect. Her behavior is normal. Judgment and thought content normal.       Medications Ordered at  today's visit: Meds ordered this encounter  Medications  . medroxyPROGESTERone (DEPO-PROVERA) 150 MG/ML injection    Sig: Inject 1 mL (150 mg total) into the muscle every 3 (three) months.    Dispense:  1 mL    Refill:  0    Other orders placed at today's visit: Orders Placed This Encounter  Procedures  . POCT urine pregnancy      Assessment:    Healthy female exam.    Plan:    Contraception: Depo-Provera injections. Follow up in: 1 year.     Return in about 3 days (around 02/19/2016) for depo shot, 1 year for yearly.

## 2016-02-16 NOTE — Addendum Note (Signed)
Addended by: Federico Flake A on: 02/16/2016 01:53 PM   Modules accepted: Orders

## 2016-02-19 ENCOUNTER — Encounter: Payer: Self-pay | Admitting: *Deleted

## 2016-02-19 ENCOUNTER — Ambulatory Visit (INDEPENDENT_AMBULATORY_CARE_PROVIDER_SITE_OTHER): Payer: Medicare Other | Admitting: *Deleted

## 2016-02-19 DIAGNOSIS — Z3042 Encounter for surveillance of injectable contraceptive: Secondary | ICD-10-CM

## 2016-02-19 DIAGNOSIS — Z3202 Encounter for pregnancy test, result negative: Secondary | ICD-10-CM | POA: Diagnosis not present

## 2016-02-19 LAB — POCT URINE PREGNANCY: PREG TEST UR: NEGATIVE

## 2016-02-19 MED ORDER — MEDROXYPROGESTERONE ACETATE 150 MG/ML IM SUSP
150.0000 mg | Freq: Once | INTRAMUSCULAR | Status: AC
  Administered 2016-02-19: 150 mg via INTRAMUSCULAR

## 2016-02-19 NOTE — Progress Notes (Signed)
Pt here for Depo. Pt's shot was due no later than July 20. Pt hasn't had sex for 6 months. I spoke with JAG and she advised ok to give shot today. Pt tolerated shot well. Return in 12 weeks for next shot. JSY

## 2016-02-20 ENCOUNTER — Ambulatory Visit (INDEPENDENT_AMBULATORY_CARE_PROVIDER_SITE_OTHER): Payer: Medicare Other | Admitting: Family Medicine

## 2016-02-20 ENCOUNTER — Encounter: Payer: Self-pay | Admitting: Family Medicine

## 2016-02-20 VITALS — BP 120/82 | HR 76 | Resp 16 | Ht 61.0 in | Wt 182.0 lb

## 2016-02-20 DIAGNOSIS — R569 Unspecified convulsions: Secondary | ICD-10-CM

## 2016-02-20 DIAGNOSIS — F17200 Nicotine dependence, unspecified, uncomplicated: Secondary | ICD-10-CM

## 2016-02-20 DIAGNOSIS — Z1329 Encounter for screening for other suspected endocrine disorder: Secondary | ICD-10-CM | POA: Diagnosis not present

## 2016-02-20 DIAGNOSIS — F17210 Nicotine dependence, cigarettes, uncomplicated: Secondary | ICD-10-CM | POA: Diagnosis not present

## 2016-02-20 DIAGNOSIS — F32A Depression, unspecified: Secondary | ICD-10-CM

## 2016-02-20 DIAGNOSIS — Z13220 Encounter for screening for lipoid disorders: Secondary | ICD-10-CM | POA: Diagnosis not present

## 2016-02-20 DIAGNOSIS — E559 Vitamin D deficiency, unspecified: Secondary | ICD-10-CM

## 2016-02-20 DIAGNOSIS — G44029 Chronic cluster headache, not intractable: Secondary | ICD-10-CM

## 2016-02-20 DIAGNOSIS — F329 Major depressive disorder, single episode, unspecified: Secondary | ICD-10-CM

## 2016-02-20 DIAGNOSIS — R7302 Impaired glucose tolerance (oral): Secondary | ICD-10-CM

## 2016-02-20 DIAGNOSIS — E669 Obesity, unspecified: Secondary | ICD-10-CM

## 2016-02-20 LAB — CYTOLOGY - PAP

## 2016-02-20 MED ORDER — FLUOXETINE HCL 20 MG PO CAPS: 20.0000 mg | ORAL_CAPSULE | Freq: Every day | ORAL | 5 refills | Status: DC

## 2016-02-20 MED ORDER — FLUOXETINE HCL 20 MG PO CAPS: 20.0000 mg | ORAL_CAPSULE | Freq: Every day | ORAL | 3 refills | Status: DC

## 2016-02-20 NOTE — Assessment & Plan Note (Signed)
Reports uncontrolled, and will f/u with neurology for management

## 2016-02-20 NOTE — Assessment & Plan Note (Signed)
Updated lab needed at/ before next visit.   

## 2016-02-20 NOTE — Patient Instructions (Addendum)
F/u in 3 month, call if you need me sooner  Labs today  Please work on smoking cessation  Change eating and drinking habits and start walking every day for 30 mins   New med for depression is fluoxetine, famvir is re sent for itch  Pls call and keep appt with Dr Gerilyn Pilgrim   Steps to Quit Smoking  Smoking tobacco can be harmful to your health and can affect almost every organ in your body. Smoking puts you, and those around you, at risk for developing many serious chronic diseases. Quitting smoking is difficult, but it is one of the best things that you can do for your health. It is never too late to quit. WHAT ARE THE BENEFITS OF QUITTING SMOKING? When you quit smoking, you lower your risk of developing serious diseases and conditions, such as:  Lung cancer or lung disease, such as COPD.  Heart disease.  Stroke.  Heart attack.  Infertility.  Osteoporosis and bone fractures. Additionally, symptoms such as coughing, wheezing, and shortness of breath may get better when you quit. You may also find that you get sick less often because your body is stronger at fighting off colds and infections. If you are pregnant, quitting smoking can help to reduce your chances of having a baby of low birth weight. HOW DO I GET READY TO QUIT? When you decide to quit smoking, create a plan to make sure that you are successful. Before you quit:  Pick a date to quit. Set a date within the next two weeks to give you time to prepare.  Write down the reasons why you are quitting. Keep this list in places where you will see it often, such as on your bathroom mirror or in your car or wallet.  Identify the people, places, things, and activities that make you want to smoke (triggers) and avoid them. Make sure to take these actions:  Throw away all cigarettes at home, at work, and in your car.  Throw away smoking accessories, such as Set designer.  Clean your car and make sure to empty the  ashtray.  Clean your home, including curtains and carpets.  Tell your family, friends, and coworkers that you are quitting. Support from your loved ones can make quitting easier.  Talk with your health care provider about your options for quitting smoking.  Find out what treatment options are covered by your health insurance. WHAT STRATEGIES CAN I USE TO QUIT SMOKING?  Talk with your healthcare provider about different strategies to quit smoking. Some strategies include:  Quitting smoking altogether instead of gradually lessening how much you smoke over a period of time. Research shows that quitting "cold [place]" is more successful than gradually quitting.  Attending in-person counseling to help you build problem-solving skills. You are more likely to have success in quitting if you attend several counseling sessions. Even short sessions of 10 minutes can be effective.  Finding resources and support systems that can help you to quit smoking and remain smoke-free after you quit. These resources are most helpful when you use them often. They can include:  Online chats with a Veterinary surgeon.  Telephone quitlines.  Printed Materials engineer.  Support groups or group counseling.  Text messaging programs.  Mobile phone applications.  Taking medicines to help you quit smoking. (If you are pregnant or breastfeeding, talk with your health care provider first.) Some medicines contain nicotine and some do not. Both types of medicines help with cravings, but the medicines that  include nicotine help to relieve withdrawal symptoms. Your health care provider may recommend:  Nicotine patches, gum, or lozenges.  Nicotine inhalers or sprays.  Non-nicotine medicine that is taken by mouth. Talk with your health care provider about combining strategies, such as taking medicines while you are also receiving in-person counseling. Using these two strategies together makes you more likely to succeed in  quitting than if you used either strategy on its own. If you are pregnant or breastfeeding, talk with your health care provider about finding counseling or other support strategies to quit smoking. Do not take medicine to help you quit smoking unless told to do so by your health care provider. WHAT THINGS CAN I DO TO MAKE IT EASIER TO QUIT? Quitting smoking might feel overwhelming at first, but there is a lot that you can do to make it easier. Take these important actions:  Reach out to your family and friends and ask that they support and encourage you during this time. Call telephone quitlines, reach out to support groups, or work with a counselor for support.  Ask people who smoke to avoid smoking around you.  Avoid places that trigger you to smoke, such as bars, parties, or smoke-break areas at work.  Spend time around people who do not smoke.  Lessen stress in your life, because stress can be a smoking trigger for some people. To lessen stress, try:  Exercising regularly.  Deep-breathing exercises.  Yoga.  Meditating.  Performing a body scan. This involves closing your eyes, scanning your body from head to toe, and noticing which parts of your body are particularly tense. Purposefully relax the muscles in those areas.  Download or purchase mobile phone or tablet apps (applications) that can help you stick to your quit plan by providing reminders, tips, and encouragement. There are many free apps, such as QuitGuide from the [employer] developer for Disease Control and Prevention). You can find other support for quitting smoking (smoking cessation) through smokefree.gov and other websites. HOW WILL I FEEL WHEN I QUIT SMOKING? Within the first 24 hours of quitting smoking, you may start to feel some withdrawal symptoms. These symptoms are usually most noticeable 2-3 days after quitting, but they usually do not last beyond 2-3 weeks. Changes or symptoms that you might experience include:  Mood  swings.  Restlessness, anxiety, or irritation.  Difficulty concentrating.  Dizziness.  Strong cravings for sugary foods in addition to nicotine.  Mild weight gain.  Constipation.  Nausea.  Coughing or a sore throat.  Changes in how your medicines work in your body.  A depressed mood.  Difficulty sleeping (insomnia). After the first 2-3 weeks of quitting, you may start to notice more positive results, such as:  Improved sense of smell and taste.  Decreased coughing and sore throat.  Slower heart rate.  Lower blood pressure.  Clearer skin.  The ability to breathe more easily.  Fewer sick days. Quitting smoking is very challenging for most people. Do not get discouraged if you are not successful the first time. Some people need to make many attempts to quit before they achieve long-term success. Do your best to stick to your quit plan, and talk with your health care provider if you have any questions or concerns.   This information is not intended to replace advice given to you by your health care provider. Make sure you discuss any questions you have with your health care provider.   Document Released: 07/02/2001 Document Revised: 11/22/2014 Document Reviewed: 11/22/2014  Chartered certified accountant Patient Education [employer].

## 2016-02-20 NOTE — Assessment & Plan Note (Signed)
Deteriorated. Patient re-educated about  the importance of commitment to a  minimum of 150 minutes of exercise per week.  The importance of healthy food choices with portion control discussed. Encouraged to start a food diary, count calories and to consider  joining a support group. Sample diet sheets offered. Goals set by the patient for the next several months.   Weight /BMI 02/20/2016 02/16/2016 01/22/2016  WEIGHT 182 lb 179 lb 150 lb  HEIGHT 5\' 1"  5\' 1"  -  BMI 34.39 kg/m2 33.82 kg/m2 28.36 kg/m2

## 2016-02-20 NOTE — Assessment & Plan Note (Signed)

## 2016-02-20 NOTE — Assessment & Plan Note (Signed)
Start fluoxetine, not suicidal or homicidal

## 2016-02-20 NOTE — Progress Notes (Signed)
   [name]     MRN: [id number]      DOB: [birth year]   HPI Ms. Cougill is here for follow up and re-evaluation of chronic medical conditions, medication management and review of any available recent lab and radiology data.  Preventive health is updated, specifically  Cancer screening and Immunization.   Questions or concerns regarding consultations or procedures which the PT has had in the interim are  Addressed.Needs to re establish with neurology, continues to have seizures, compliance a major issue Off antidepressant for approx 4 weeks, wants to change the med as she feels this is causing weight gain, still has depression, not suicidal or homicidal  ROS Denies recent fever or chills. Denies sinus pressure, nasal congestion, ear pain or sore throat. Denies chest congestion, productive cough or wheezing. Denies chest pains, palpitations and leg swelling Denies abdominal pain, nausea, vomiting,diarrhea or constipation.   Denies dysuria, frequency, hesitancy or incontinence. Denies joint pain, swelling and limitation in mobility. Denies skin break down or rash.   PE  BP 120/82 (BP Location: Left Arm, Patient Position: Sitting, Cuff Size: Large)   Pulse 76   Resp 16   Ht 5\' 1"  (1.549 m)   Wt 182 lb (82.6 kg)   SpO2 96%   BMI 34.39 kg/m   Patient alert and oriented and in no cardiopulmonary distress.  HEENT: No facial asymmetry, EOMI,   oropharynx pink and moist.  Neck supple no JVD, no mass.  Chest: Clear to auscultation bilaterally.  CVS: S1, S2 no murmurs, no S3.Regular rate.  ABD: Soft non tender.   Ext: No edema  MS: Adequate ROM spine, shoulders, hips and knees.  Skin: Intact, no ulcerations or rash noted.  Psych: Good eye contact, normal affect. Memory intact not anxious mildly  depressed appearing.  CNS: CN 2-12 intact, power,  normal throughout.no focal deficits noted.   Assessment & Plan  Seizures (HCC) Uncontrolled , needs to  re establish with  neuro,m referral entered and importance of follow up is stressed  Depression Start fluoxetine, not suicidal or homicidal  Obesity (BMI 30.0-34.9) Deteriorated. Patient re-educated about  the importance of commitment to a  minimum of 150 minutes of exercise per week.  The importance of healthy food choices with portion control discussed. Encouraged to start a food diary, count calories and to consider  joining a support group. Sample diet sheets offered. Goals set by the patient for the next several months.   Weight /BMI 02/20/2016 02/16/2016 01/22/2016  WEIGHT 182 lb 179 lb 150 lb  HEIGHT 5\' 1"  5\' 1"  -  BMI 34.39 kg/m2 33.82 kg/m2 28.36 kg/m2      Headache Reports uncontrolled, and will f/u with neurology for management  Vitamin D deficiency Updated lab needed at/ before next visit.   NICOTINE ADDICTION Patient counseled for approximately 5 minutes regarding the health risks of ongoing nicotine use, specifically all types of cancer, heart disease, stroke and respiratory failure. The options available for help with cessation ,the behavioral changes to assist the process, and the option to either gradully reduce usage  Or abruptly stop.is also discussed. Pt is also encouraged to set specific goals in number of cigarettes used daily, as well as to set a quit date. Current is 1 PPD

## 2016-02-20 NOTE — Assessment & Plan Note (Addendum)
Uncontrolled , needs to  re establish with neuro,m referral entered and importance of follow up is stressed

## 2016-02-27 ENCOUNTER — Other Ambulatory Visit: Payer: Self-pay

## 2016-02-27 DIAGNOSIS — G43909 Migraine, unspecified, not intractable, without status migrainosus: Secondary | ICD-10-CM | POA: Diagnosis not present

## 2016-02-27 DIAGNOSIS — G3184 Mild cognitive impairment, so stated: Secondary | ICD-10-CM | POA: Diagnosis not present

## 2016-02-27 DIAGNOSIS — G40919 Epilepsy, unspecified, intractable, without status epilepticus: Secondary | ICD-10-CM | POA: Diagnosis not present

## 2016-02-27 DIAGNOSIS — Z79899 Other long term (current) drug therapy: Secondary | ICD-10-CM | POA: Diagnosis not present

## 2016-02-27 DIAGNOSIS — F17200 Nicotine dependence, unspecified, uncomplicated: Secondary | ICD-10-CM | POA: Diagnosis not present

## 2016-02-27 MED ORDER — FAMCICLOVIR 250 MG PO TABS: 250.0000 mg | ORAL_TABLET | Freq: Two times a day (BID) | ORAL | 0 refills | Status: DC

## 2016-02-28 ENCOUNTER — Telehealth: Payer: Self-pay

## 2016-02-28 NOTE — Telephone Encounter (Signed)
Yes that is true, if she c/o vaginal itch and discharge, pls ask her to send wet prep, self collected , may have yeast infection Any other and more specific "itch complaints , let me know

## 2016-02-28 NOTE — Telephone Encounter (Signed)
Pt called stating that you were supposed to send her in a cream to help with her rash. I told her the famcilovir would help with that but she wants a cream to help with the itch. Please advise

## 2016-02-29 ENCOUNTER — Other Ambulatory Visit (HOSPITAL_COMMUNITY)
Admission: RE | Admit: 2016-02-29 | Discharge: 2016-02-29 | Disposition: A | Discharge status: Home / Self Care | Payer: Medicare Other | Source: Ambulatory Visit | Attending: Family Medicine | Admitting: Family Medicine

## 2016-02-29 ENCOUNTER — Other Ambulatory Visit: Payer: Self-pay

## 2016-02-29 ENCOUNTER — Ambulatory Visit: Payer: Medicare Other

## 2016-02-29 DIAGNOSIS — N760 Acute vaginitis: Secondary | ICD-10-CM | POA: Insufficient documentation

## 2016-02-29 MED ORDER — BETAMETHASONE DIPROPIONATE 0.05 % EX CREA: TOPICAL_CREAM | Freq: Two times a day (BID) | CUTANEOUS | 0 refills | Status: DC

## 2016-02-29 NOTE — Addendum Note (Signed)
Addended by: Abner GreenspanHUDY, Nasira Janusz H on: 02/29/2016 01:51 PM   Modules accepted: Orders

## 2016-02-29 NOTE — Progress Notes (Signed)
Patient collected wet prep specimen and is aware she will be notified with the results

## 2016-02-29 NOTE — Telephone Encounter (Signed)
Patient came in to submit specimen

## 2016-03-04 LAB — CERVICOVAGINAL ANCILLARY ONLY: Wet Prep (BD Affirm): POSITIVE — AB

## 2016-03-08 ENCOUNTER — Telehealth: Payer: Self-pay

## 2016-03-08 MED ORDER — FLUCONAZOLE 150 MG PO TABS: 150.0000 mg | ORAL_TABLET | Freq: Once | ORAL | 0 refills | Status: AC

## 2016-03-08 MED ORDER — METRONIDAZOLE 500 MG PO TABS: 500.0000 mg | ORAL_TABLET | Freq: Two times a day (BID) | ORAL | 0 refills | Status: DC

## 2016-03-08 NOTE — Telephone Encounter (Signed)
-----   Message from Kerri PerchesMargaret E Simpson, MD sent at 03/04/2016  7:28 PM EDT ----- Please let pt know she has bacterial vaginosis. Please send in metronidazole 500mg  one twice daily #14 only, or , if allergic to metronidazole, then send in Cleocin (clindamycin) 300mg  one twice daily #14 no refills. Offer to prescribe fluconazole (150 mg one daily #2 only) if she reports often getting vaginal yeast infections with antibiotics.Please ask specifically to prevent call back , and address the need. Please advise against douching and cigarette smoking (if applicable) as both appear to increase the risk of BV. Questions, please ask.

## 2016-03-08 NOTE — Telephone Encounter (Signed)
Medications sent to pharmacy

## 2016-03-13 ENCOUNTER — Ambulatory Visit: Payer: Medicare Other | Admitting: Diagnostic Neuroimaging

## 2016-03-14 ENCOUNTER — Encounter: Payer: Self-pay | Admitting: Diagnostic Neuroimaging

## 2016-03-15 ENCOUNTER — Emergency Department (HOSPITAL_COMMUNITY)
Admission: EM | Admit: 2016-03-15 | Discharge: 2016-03-15 | Disposition: A | Discharge status: Home / Self Care | Payer: Medicare Other | Attending: Emergency Medicine | Admitting: Emergency Medicine

## 2016-03-15 DIAGNOSIS — R569 Unspecified convulsions: Secondary | ICD-10-CM

## 2016-03-15 DIAGNOSIS — G40909 Epilepsy, unspecified, not intractable, without status epilepticus: Secondary | ICD-10-CM | POA: Insufficient documentation

## 2016-03-15 DIAGNOSIS — Z79899 Other long term (current) drug therapy: Secondary | ICD-10-CM | POA: Diagnosis not present

## 2016-03-15 DIAGNOSIS — F17210 Nicotine dependence, cigarettes, uncomplicated: Secondary | ICD-10-CM | POA: Insufficient documentation

## 2016-03-15 HISTORY — DX: Unspecified convulsions: R56.9

## 2016-03-15 MED ORDER — KETOROLAC TROMETHAMINE 30 MG/ML IJ SOLN
15.0000 mg | Freq: Once | INTRAMUSCULAR | Status: AC
  Administered 2016-03-15: 15 mg via INTRAVENOUS
  Filled 2016-03-15: qty 1

## 2016-03-15 MED ORDER — METOCLOPRAMIDE HCL 5 MG/ML IJ SOLN
10.0000 mg | Freq: Once | INTRAMUSCULAR | Status: AC
  Administered 2016-03-15: 10 mg via INTRAVENOUS
  Filled 2016-03-15: qty 2

## 2016-03-15 MED ORDER — DEXAMETHASONE SODIUM PHOSPHATE 10 MG/ML IJ SOLN
10.0000 mg | Freq: Once | INTRAMUSCULAR | Status: AC
  Administered 2016-03-15: 10 mg via INTRAVENOUS
  Filled 2016-03-15: qty 1

## 2016-03-15 MED ORDER — DIPHENHYDRAMINE HCL 50 MG/ML IJ SOLN
25.0000 mg | Freq: Once | INTRAMUSCULAR | Status: AC
  Administered 2016-03-15: 25 mg via INTRAVENOUS
  Filled 2016-03-15: qty 1

## 2016-03-15 NOTE — ED Triage Notes (Signed)
Patient with seizure today. Non-compliance with medication. EMS reports Keppra filled on 01/26/16 and mostly full and norco filled 02/20/16 and gone. Patient alert/oriented. No distress.

## 2016-03-15 NOTE — ED Triage Notes (Addendum)
seziures times 2 .  Last one this morning.  C/o headache at this time.  Alert and oriented

## 2016-03-15 NOTE — ED Provider Notes (Signed)
[facility]-EMER[facility]ENCY DEPT Provider Note   CSN: [id number] Arrival date & time: 03/15/16  1036  By signing my name below, I, [name], attest that this documentation has been prepared under the direction and in the presence of Elizabeth Sprout, MD. Electronically Signed: Placido Levine, ED Scribe. 03/15/16. 11:14 AM.   History   Chief Complaint Chief Complaint  Patient presents with  . Seizures    HPI HPI Comments: [name] is a 35 y.o. female who presents to the Emergency Department complaining of multiple seizures that occurred this morning at 7:00 am. Pt states her seizures typically occur while she is asleep. She states she is compliant with her seizure medications. She reports an associated, diffuse, HA and photophobia which is typical s/p a seizures for her and confirms it feels similar to prior HA. She denies any falls or recent injuries. Pt reports intermittent ETOH and marijuana use. She last used marijuana 2 days ago. Pt is unsure of the timing of her LMP noting she receives depo injections. She denies numbness, tingling and weakness.   The history is provided by the patient. No language interpreter was used.    Past Medical History:  Diagnosis Date  . Back pain   . Chronic headaches   . Depression   . Genital warts    herpes  . Herpes   . Seizures [facility])     Patient Active Problem List   Diagnosis Date Noted  . Status epilepticus, generalized convulsive (HCC) 11/20/2015  . Seizure (HCC) 11/19/2015  . Amenorrhea 09/28/2011  . Vitamin D deficiency 09/06/2011  . Headache 03/10/2011  . Obesity (BMI 30.0-34.9) 05/28/2009  . Seizures (HCC) 10/26/2008  . NICOTINE ADDICTION 12/10/2007  . Depression 12/10/2007    Past Surgical History:  Procedure Laterality Date  . CESAREAN SECTION     x 3    OB History    Gravida Para Term Preterm AB Living   3 3       2    SAB TAB Ectopic Multiple Live Births           2       Home Medications    Prior to  Admission medications   Medication Sig Start Date End Date Taking? Authorizing Provider  betamethasone dipropionate (DIPROLENE) 0.05 % cream Apply topically 2 (two) times daily. 02/29/16   Kerri Perches, MD  famciclovir (FAMVIR) 250 MG tablet Take 1 tablet (250 mg total) by mouth 2 (two) times daily. For itch 02/27/16   Kerri Perches, MD  FLUoxetine (PROZAC) 20 MG capsule Take 1 capsule (20 mg total) by mouth daily. 02/20/16   Kerri Perches, MD  HYDROcodone-acetaminophen (NORCO) 7.5-325 MG per tablet Take 1 tablet by mouth daily as needed for moderate pain.  11/28/14   Historical Provider, MD  LamoTRIgine 250 MG TB24 Takes 2 tabs at bedtime. 08/11/14   Historical Provider, MD  levETIRAcetam (KEPPRA) 500 MG tablet TK 1 T PO BID 01/28/16   Historical Provider, MD  medroxyPROGESTERone (DEPO-PROVERA) 150 MG/ML injection INJECT 1 ML IN THE MUSCLE ONCE EVERY 3 MONTHS AS DIRECTED 02/01/16   Adline Potter, NP  metroNIDAZOLE (FLAGYL) 500 MG tablet Take 1 tablet (500 mg total) by mouth 2 (two) times daily. 03/08/16   Kerri Perches, MD  Perampanel (FYCOMPA) 10 MG TABS Take 10 mg by mouth daily. 11/20/15   Elliot Cousin, MD  rizatriptan (MAXALT) 10 MG tablet Take 10 mg by mouth as needed for migraine. Reported on 09/20/2015  Historical Provider, MD    Family History No family history on file.  Social History Social History  Substance Use Topics  . Smoking status: Current Every Day Smoker    Packs/day: 1.00    Years: 10.00    Types: Cigarettes  . Smokeless tobacco: Never Used  . Alcohol use 1.2 oz/week    2 Cans of beer per week     Comment: occasionally     Allergies   Morphine   Review of Systems Review of Systems  Eyes: Positive for photophobia.  Musculoskeletal: Negative for myalgias.  Skin: Negative for wound.  Neurological: Positive for seizures and headaches. Negative for weakness and numbness.  All other systems reviewed and are negative.  Physical Exam Updated Vital  Signs BP 111/83 (BP Location: Right Arm)   Pulse 88   Temp 98.3 F (36.8 C) (Oral)   Resp 20   Ht 5\' 2"  (1.575 m)   Wt 175 lb (79.4 kg)   SpO2 98%   BMI 32.01 kg/m   Physical Exam  Constitutional: She is oriented to person, place, and time. She appears well-developed and well-nourished. No distress.  HENT:  Head: Normocephalic and atraumatic.  Mouth/Throat: Oropharynx is clear and moist.  Eyes: Conjunctivae and EOM are normal. Pupils are equal, round, and reactive to light. Right eye exhibits no discharge. Left eye exhibits no discharge.  photophobia  Neck: Normal range of motion. Neck supple. No spinous process tenderness present. No neck rigidity. No Brudzinski's sign and no Kernig's sign noted.  Cardiovascular: Normal rate, normal heart sounds and intact distal pulses.   No murmur heard. Pulmonary/Chest: Effort normal and breath sounds normal. No respiratory distress. She has no wheezes. She has no rales.  Abdominal: Soft. She exhibits no distension. There is no tenderness.  Musculoskeletal: Normal range of motion. She exhibits no edema or tenderness.  Lymphadenopathy:    She has no cervical adenopathy.  Neurological: She is alert and oriented to person, place, and time. She has normal strength. No cranial nerve deficit or sensory deficit. Coordination and gait normal. GCS eye subscore is 4. GCS verbal subscore is 5. GCS motor subscore is 6.  Skin: Skin is warm and dry.  Psychiatric: She has a normal mood and affect. Her behavior is normal.  Nursing note and vitals reviewed.   ED Treatments / Results  Labs (all labs ordered are listed, but only abnormal results are displayed) Labs Reviewed - No data to display  EKG  EKG Interpretation None       Radiology No results found.  Procedures Procedures  COORDINATION OF CARE: 11:11 AM Discussed next steps with pt. Pt verbalized understanding and is agreeable with the plan.    Medications Ordered in ED Medications    metoCLOPramide (REGLAN) injection 10 mg (10 mg Intravenous Given 03/15/16 1126)  diphenhydrAMINE (BENADRYL) injection 25 mg (25 mg Intravenous Given 03/15/16 1127)  dexamethasone (DECADRON) injection 10 mg (10 mg Intravenous Given 03/15/16 1127)  ketorolac (TORADOL) 30 MG/ML injection 15 mg (15 mg Intravenous Given 03/15/16 1257)     Initial Impression / Assessment and Plan / ED Course  I have reviewed the triage vital signs and the nursing notes.  Pertinent labs & imaging results that were available during my care of the patient were reviewed by me and considered in my medical decision making (see chart for details).  Clinical Course    Patient is a 35 year old female with a history of a seizure disorder who had a seizure today around  7:00 this morning. Patient denies any injury from the seizure but is currently complaining of a headache which she often gets after her seizure activity. There is no focal neurologic finding on exam. Patient states she's taking her medication as prescribed. Unclear when she last saw a neurologist.  Currently taking Lamictal and Fycompa.  She denies any loss of sleep or recent illness. Exam within normal limits. Patient given headache cocktail. Will reassess.  1:14 PM Headache improved after headache cocktail. Patient given follow-up with neurology. She currently does not drive  I personally performed the services described in this documentation, which was scribed in my presence.  The recorded information has been reviewed and considered.  Final Clinical Impressions(s) / ED Diagnoses   Final diagnoses:  Seizure [facility](HCC)    New Prescriptions Discharge Medication List as of 03/15/2016  1:02 PM       Elizabeth SproutWhitney Deleon Passe, MD 03/15/16 1314

## 2016-03-29 ENCOUNTER — Encounter: Payer: Self-pay | Admitting: Diagnostic Neuroimaging

## 2016-03-29 ENCOUNTER — Ambulatory Visit (INDEPENDENT_AMBULATORY_CARE_PROVIDER_SITE_OTHER): Payer: Medicare Other | Admitting: Diagnostic Neuroimaging

## 2016-03-29 VITALS — BP 111/77 | HR 78 | Ht 62.0 in | Wt 173.4 lb

## 2016-03-29 DIAGNOSIS — G40909 Epilepsy, unspecified, not intractable, without status epilepticus: Secondary | ICD-10-CM

## 2016-03-29 MED ORDER — LACOSAMIDE 50 MG PO TABS: 50.0000 mg | ORAL_TABLET | Freq: Two times a day (BID) | ORAL | 5 refills | Status: DC

## 2016-03-29 NOTE — Patient Instructions (Signed)
Thank you for coming to see [place] at [facility]. I hope we have been able to provide you high quality care today.  You may receive a patient satisfaction survey over the next few weeks. We would appreciate your feedback and comments so that we may continue to improve ourselves and the health of our patients.  - Continue lamotrigine XR (274m x 2 tabs) at bedtime - Continue fycompa 124mat bedtime - Start vimpat [id number] a day  - According to [place] law, you can not drive unless you are seizure free for at least 6 months and under physician's care.   - Please maintain seizure precautions. Do not participate in activities where a loss of awareness could harm you or someone else. No swimming alone, no tub bathing, no hot tubs, no driving, no operating motorized vehicles (cars, ATVs, motocycles, etc), lawnmowers or power tools. No standing at heights, such as rooftops, ladders or stairs. Avoid hot objects such as stoves, heaters, open fires. Wear a helmet when riding a bicycle, scooter, skateboard, etc. and avoid areas of traffic. Set your water heater to 120 degrees or less.    ~~~~~~~~~~~~~~~~~~~~~~~~~~~~~~~~~~~~~~~~~~~~~~~~~~~~~~~~~~~~~~~~~  DR. PENUMALLI'S GUIDE TO HAPPY AND HEALTHY LIVING These are some of my general health and wellness recommendations. Some of them may apply to you better than others. Please use common sense as you try these suggestions and feel free to ask me any questions.   ACTIVITY/FITNESS Mental, social, emotional and physical stimulation are very important for brain and body health. Try learning a new activity (arts, music, language, sports, games).  Keep moving your body to the best of your abilities. You can do this at home, inside or outside, the park, community center, gym or anywhere you like. Consider a physical therapist or personal trainer to get started. Consider the app Sworkit. Fitness trackers such as smart-watches, smart-phones or Fitbits  can help as well.   NUTRITION Eat more plants: colorful vegetables, nuts, seeds and berries.  Eat less sugar, salt, preservatives and processed foods.  Avoid toxins such as cigarettes and alcohol.  Drink water when you are thirsty. Warm water with a slice of lemon is an excellent morning drink to start the day.  Consider these websites for more information The Nutrition Source (htthttps://www.henry-hernandez.biz/recision Nutrition (wwwWindowBlog.ch RELAXATION Consider practicing mindfulness meditation or other relaxation techniques such as deep breathing, prayer, yoga, tai chi, massage. See website mindful.org or the apps Headspace or Calm to help get started.   SLEEP Try to get at least 7-8+ hours sleep per day. Regular exercise and reduced caffeine will help you sleep better. Practice good sleep hygeine techniques. See website sleep.org for more information.   PLANNING Prepare estate planning, living will, healthcare POA documents. Sometimes this is best planned with the help of an attorney. Theconversationproject.org and agingwithdignity.org are excellent resources.

## 2016-03-29 NOTE — Progress Notes (Signed)
[facility]  PATIENT: [name] DOB: 02-16-1981  REFERRING CLINICIAN: Lodema Hong HISTORY FROM: patient  REASON FOR VISIT: new consult    HISTORICAL  CHIEF COMPLAINT:  Chief Complaint  Patient presents with  . Seizures    rm 6, New Pt    HISTORY OF PRESENT ILLNESS:   35 year old right-handed female here for evaluation of seizure disorder. 2010 patient had onset of first seizure in life. Patient had no warning, was found by other people making a strange noise, sucking on tongue and lips, then passing out and having generalized convulsions. Patient had tongue biting and urinary incontinence. Patient had eyes rolling back. Patient was evaluated by another neurologist (Dr. Gerilyn Pilgrim) who started antiseizure medication. Patient does not remember exactly which medication she has been on. Currently patient on lamotrigine XR 250 mg 2 tabs at bedtime, fycompa 12mg  at bedtime.  Patient is having 1-2 breakthrough seizures every month. She has had multiple emergency room visits. Patient lives with her 2 children ages 56 and 15 years old. Breakthrough seizures in the past have occurred in the setting of missed doses of medication, alcohol use, but also without any triggering factors. Patient has had video EEG epilepsy monitoring unit evaluation at [facility] in 2012. No typical seizures were captured over 7 days of monitoring however rare left sided sharp waves were noted during the recording. Apparently patient has an appointment in October 2017 to go back to [facility] for second opinion evaluation of intractable epilepsy and consideration of surgical treatment options. In the meantime patient had a breakthrough seizure, went to the emergency room and then was set up for follow-up appointment in my office.    REVIEW OF SYSTEMS: Full 14 system review of systems performed and negative with exception of: Memory loss confusion headache slurred speech  seizure depression change in appetite aching muscles.  ALLERGIES: Allergies  Allergen Reactions  . Morphine Other (See Comments)    Unknown Reaction     HOME MEDICATIONS: Outpatient Medications Prior to Visit  Medication Sig Dispense Refill  . betamethasone dipropionate (DIPROLENE) 0.05 % cream Apply topically 2 (two) times daily. 30 g 0  . FLUoxetine (PROZAC) 20 MG capsule Take 1 capsule (20 mg total) by mouth daily. 30 capsule 5  . HYDROcodone-acetaminophen (NORCO) 7.5-325 MG per tablet Take 1 tablet by mouth daily as needed for moderate pain.   0  . LamoTRIgine 250 MG TB24 Takes 2 tabs at bedtime.  2  . medroxyPROGESTERone (DEPO-PROVERA) 150 MG/ML injection INJECT 1 ML IN THE MUSCLE ONCE EVERY 3 MONTHS AS DIRECTED 1 mL 3  . methocarbamol (ROBAXIN) 500 MG tablet Take 1 tablet by mouth daily as needed for muscle spasms.    . Perampanel (FYCOMPA) 10 MG TABS Take 10 mg by mouth daily.    . rizatriptan (MAXALT) 10 MG tablet Take 10 mg by mouth as needed for migraine. Reported on 09/20/2015    . famciclovir (FAMVIR) 250 MG tablet Take 1 tablet (250 mg total) by mouth 2 (two) times daily. For itch (Patient not taking: Reported on 03/29/2016) 60 tablet 0  . levETIRAcetam (KEPPRA) 500 MG tablet TK 1 T PO BID  0  . metroNIDAZOLE (FLAGYL) 500 MG tablet Take 1 tablet (500 mg total) by mouth 2 (two) times daily. 14 tablet 0   No facility-administered medications prior to visit.     PAST MEDICAL HISTORY: Past Medical History:  Diagnosis Date  . Back pain   . Chronic headaches   .  Depression   . Genital warts    herpes  . Herpes   . Seizures (HCC) 03/15/2016    PAST SURGICAL HISTORY: Past Surgical History:  Procedure Laterality Date  . CESAREAN SECTION     x 3    FAMILY HISTORY: Family History  Problem Relation Age of Onset  . Hypertension Mother     SOCIAL HISTORY:  Social History   Social History  . Marital status: Legally Separated    Spouse name: N/A  . Number of  children: 2  . Years of education: 10, GED   Occupational History  .      na   Social History Main Topics  . Smoking status: Current Every Day Smoker    Packs/day: 1.00    Years: 10.00    Types: Cigarettes  . Smokeless tobacco: Never Used  . Alcohol use 1.2 oz/week    2 Cans of beer per week     Comment: occasionally  . Drug use:     Types: Marijuana     Comment: 3 times a week, 03/29/16 "not often"  . Sexual activity: Yes    Birth control/ protection: Injection   Other Topics Concern  . Not on file   Social History Narrative   Lives  With 2 children in home   Caffeine -Pepsi 2 daily     PHYSICAL EXAM  GENERAL EXAM/CONSTITUTIONAL: Vitals:  Vitals:   03/29/16 0910  BP: 111/77  Pulse: 78  Weight: 173 lb 6.4 oz (78.7 kg)  Height: 5\' 2"  (1.575 m)     Body mass index is 31.72 kg/m.  Visual Acuity Screening   Right eye Left eye Both eyes  Without correction: 20/30 20/20   With correction:        Patient is in no distress; well developed, nourished and groomed; neck is supple  CARDIOVASCULAR:  Examination of carotid arteries is normal; no carotid bruits  Regular rate and rhythm, no murmurs  Examination of peripheral vascular system by observation and palpation is normal  EYES:  Ophthalmoscopic exam of optic discs and posterior segments is normal; no papilledema or hemorrhages  MUSCULOSKELETAL:  Gait, strength, tone, movements noted in Neurologic exam below  NEUROLOGIC: MENTAL STATUS:  No flowsheet data found.  awake, alert, oriented to person, place and time  recent and remote memory intact  normal attention and concentration  language fluent, comprehension intact, naming intact,   fund of knowledge appropriate  CRANIAL NERVE:   2nd - no papilledema on fundoscopic exam  2nd, 3rd, 4th, 6th - pupils equal and reactive to light, visual fields full to confrontation, extraocular muscles intact, no nystagmus  5th - facial sensation  symmetric  7th - facial strength symmetric  8th - hearing intact  9th - palate elevates symmetrically, uvula midline  11th - shoulder shrug symmetric  12th - tongue protrusion midline  MOTOR:   normal bulk and tone, full strength in the BUE, BLE  SENSORY:   normal and symmetric to light touch, temperature, vibration  COORDINATION:   finger-nose-finger, fine finger movements normal  REFLEXES:   deep tendon reflexes present and symmetric  GAIT/STATION:   narrow based gait; able to walk tandem; romberg is negative    DIAGNOSTIC DATA (LABS, IMAGING, TESTING) - I reviewed patient records, labs, notes, testing and imaging myself where available.  Lab Results  Component Value Date   WBC 7.1 01/22/2016   HGB 14.9 01/22/2016   HCT 44.1 01/22/2016   MCV 96.7 01/22/2016  PLT 361 01/22/2016      Component Value Date/Time   NA 139 01/22/2016 0742   K 3.9 01/22/2016 0742   CL 109 01/22/2016 0742   CO2 23 01/22/2016 0742   GLUCOSE 94 01/22/2016 0742   BUN 10 01/22/2016 0742   CREATININE 0.98 01/22/2016 0742   CREATININE 0.88 04/22/2013 0921   CALCIUM 8.8 (L) 01/22/2016 0742   PROT 7.9 09/05/2015 1612   ALBUMIN 4.4 09/05/2015 1612   AST 15 09/05/2015 1612   ALT 14 09/05/2015 1612   ALKPHOS 65 09/05/2015 1612   BILITOT 0.7 09/05/2015 1612   GFRNONAA >60 01/22/2016 0742   GFRAA >60 01/22/2016 0742   Lab Results  Component Value Date   CHOL 144 04/22/2013   HDL 44 04/22/2013   LDLCALC 83 04/22/2013   TRIG 83 04/22/2013   CHOLHDL 3.3 04/22/2013   No results found for: HGBA1C No results found for: VITAMINB12 Lab Results  Component Value Date   TSH 0.920 07/27/2012    04/30/11 [facility] EMU Hospital Course "- For full history at presentation please see H&P. Briefly, patient is a 35 year old female with 2 1/2 year history of spells concerning for seizures occuring at night. She states that 2-3 times per month she will have an episode while sleeping where she  will shake all over and with bite her tongue. She only knows this because of her children telling her this. She states she is slightly confused afterwards but comes back to baseline quickly. She has had bladder incontinence before. She denies ever having these while she is awake, has only had them during the day if she is napping. Physical exam at admission was normal.  - She was admitted to the EMU in order to capture a spell in order to further characterize this because she reports being compliant with several medications and not having a decrease in frequency of these spells. She admits to heavy drinking up to several months ago when she cut back to 2 beers twice weekly and noticed no decrease in her episodes. She has been on Keppra and lamictal without further control. - While she was inpatient she had no episodes, and EEG was only significant for incidental left sided sharp waves of which there may or may not be correlation to her spells. After seven days of monitoring the patient wished to be discharged. We discharged the patient with instructions to stop her Keppra and titrate up on her lamictal to 300mg  twice daily. It was thought that at this dose of Keppra without further control of her episodes this may not be contributing effectively to better control. She will be followed up in one month in our epislpesy clinic and has been instructed to make a follow-up appointment with her primary neurologist"  01/20/13 EEG  - normal   10/28/08 MRI brain [I reviewed images myself and agree with interpretation. -VRP]  - No acute intracranial abnormalities demonstrated. - Scattered nonspecific supratentorial white matter foci; see comments above.  11/19/15 CT head [I reviewed images myself and agree with interpretation. -VRP]  - no acute findings     ASSESSMENT AND PLAN  35 y.o. year old female here with multiple intermittent generalized convulsive seizures since 2010. Patient has tried and failed 3  antiseizure medications.   Dx: intractable epilepsy (primary generalized  vs temporal lobe)  1. Seizure disorder (HCC)      PLAN:  - add vimpat 50mg  BID  - Continue lamotrigine XR (250mg  x 2 tabs)  at bedtime  - Continue fycompa 12mg  at bedtime  - agree with referral to [facility] (referral and appt already made for Oct 2017 by Dr. Gerilyn Pilgrim)  - According to [facility] law, you can not drive unless you are seizure free for at least 6 months and under physician's care.   - Please maintain seizure precautions. Do not participate in activities where a loss of awareness could harm you or someone else. No swimming alone, no tub bathing, no hot tubs, no driving, no operating motorized vehicles (cars, ATVs, motocycles, etc), lawnmowers or power tools. No standing at heights, such as rooftops, ladders or stairs. Avoid hot objects such as stoves, heaters, open fires. Wear a helmet when riding a bicycle, scooter, skateboard, etc. and avoid areas of traffic. Set your water heater to 120 degrees or less.   Orders Placed This Encounter  Procedures  . Lamotrigine level   Meds ordered this encounter  Medications  . lacosamide (VIMPAT) 50 MG TABS tablet    Sig: Take 1 tablet (50 mg total) by mouth 2 (two) times daily.    Dispense:  60 tablet    Refill:  5   Return in about 3 months (around 06/28/2016).  I reviewed images, labs, notes, records myself. I summarized findings and reviewed with patient, for this high risk condition (seizure disorder) requiring high complexity decision making.    Suanne Marker, MD 03/29/2016, 9:39 AM Certified in Neurology, Neurophysiology and Neuroimaging  [facility] [address] [place], [place] [address] [phone]

## 2016-04-02 ENCOUNTER — Emergency Department (HOSPITAL_COMMUNITY)
Admission: EM | Admit: 2016-04-02 | Discharge: 2016-04-02 | Discharge status: Against Medical Advice | Payer: Medicare Other | Attending: Emergency Medicine | Admitting: Emergency Medicine

## 2016-04-02 ENCOUNTER — Encounter (HOSPITAL_COMMUNITY): Payer: Self-pay | Admitting: Emergency Medicine

## 2016-04-02 ENCOUNTER — Telehealth: Payer: Self-pay | Admitting: *Deleted

## 2016-04-02 DIAGNOSIS — G40909 Epilepsy, unspecified, not intractable, without status epilepticus: Secondary | ICD-10-CM | POA: Diagnosis not present

## 2016-04-02 DIAGNOSIS — Z79899 Other long term (current) drug therapy: Secondary | ICD-10-CM | POA: Diagnosis not present

## 2016-04-02 DIAGNOSIS — R569 Unspecified convulsions: Secondary | ICD-10-CM

## 2016-04-02 DIAGNOSIS — G40901 Epilepsy, unspecified, not intractable, with status epilepticus: Secondary | ICD-10-CM | POA: Insufficient documentation

## 2016-04-02 DIAGNOSIS — F17210 Nicotine dependence, cigarettes, uncomplicated: Secondary | ICD-10-CM | POA: Insufficient documentation

## 2016-04-02 DIAGNOSIS — R402411 Glasgow coma scale score 13-15, in the field [EMT or ambulance]: Secondary | ICD-10-CM | POA: Diagnosis not present

## 2016-04-02 LAB — LAMOTRIGINE LEVEL: Lamotrigine Lvl: 9 ug/mL (ref 2.0–20.0)

## 2016-04-02 MED ORDER — LACOSAMIDE 50 MG PO TABS
50.0000 mg | ORAL_TABLET | Freq: Once | ORAL | Status: AC
  Administered 2016-04-02: 50 mg via ORAL
  Filled 2016-04-02: qty 1

## 2016-04-02 NOTE — ED Provider Notes (Signed)
[facility]-EMERGENCY DEPT Provider Note   CSN: [id number] Arrival date & time: 04/02/16  0754  By signing my name below, I, [name], attest that this documentation has been prepared under the direction and in the presence of Elizabeth Bale, MD. Electronically Signed: Aggie Levine, ED Scribe. 04/02/16. 8:24 AM.  History   Chief Complaint Chief Complaint  Patient presents with  . Seizures   The history is provided by the patient. No language interpreter was used.   HPI Comments:  [name] is a 35 y.o. female with PMHx of seizures and chronic headaches brought in by ambulance, who presents to the Emergency Department complaining of seizure, which occurred 1-2 hours ago. Episode lasted approximately 4-5 minutes per EMS and pt was with children. Episodes occur approximately twice a month. Associated symptoms include postictal state en route, which has since resolved. She reports that she has not filled new prescriptions for Vimpat and did not take medication last night. Denies recent illnesses or bitten tongue. Pt is a cigarette smoker; no alcohol or recreational drug use.   Past Medical History:  Diagnosis Date  . Back pain   . Chronic headaches   . Depression   . Genital warts    herpes  . Herpes   . Seizures (HCC) 03/15/2016    Patient Active Problem List   Diagnosis Date Noted  . Status epilepticus, generalized convulsive (HCC) 11/20/2015  . Seizure (HCC) 11/19/2015  . Amenorrhea 09/28/2011  . Vitamin D deficiency 09/06/2011  . Headache 03/10/2011  . Obesity (BMI 30.0-34.9) 05/28/2009  . Seizures (HCC) 10/26/2008  . NICOTINE ADDICTION 12/10/2007  . Depression 12/10/2007    Past Surgical History:  Procedure Laterality Date  . CESAREAN SECTION     x 3    OB History    Gravida Para Term Preterm AB Living   3 3       2    SAB TAB Ectopic Multiple Live Births           2       Home Medications    Prior to Admission medications   Medication Sig Start Date End  Date Taking? Authorizing Provider  betamethasone dipropionate (DIPROLENE) 0.05 % cream Apply topically 2 (two) times daily. 02/29/16   Kerri Perches, MD  famciclovir (FAMVIR) 250 MG tablet Take 1 tablet (250 mg total) by mouth 2 (two) times daily. For itch Patient not taking: Reported on 03/29/2016 02/27/16   Kerri Perches, MD  FLUoxetine (PROZAC) 20 MG capsule Take 1 capsule (20 mg total) by mouth daily. 02/20/16   Kerri Perches, MD  HYDROcodone-acetaminophen (NORCO) 7.5-325 MG per tablet Take 1 tablet by mouth daily as needed for moderate pain.  11/28/14   Historical Provider, MD  lacosamide (VIMPAT) 50 MG TABS tablet Take 1 tablet (50 mg total) by mouth 2 (two) times daily. 03/29/16   Suanne Marker, MD  LamoTRIgine 250 MG TB24 Takes 2 tabs at bedtime. 08/11/14   Historical Provider, MD  medroxyPROGESTERone (DEPO-PROVERA) 150 MG/ML injection INJECT 1 ML IN THE MUSCLE ONCE EVERY 3 MONTHS AS DIRECTED 02/01/16   Adline Potter, NP  methocarbamol (ROBAXIN) 500 MG tablet Take 1 tablet by mouth daily as needed for muscle spasms. 02/27/16   Historical Provider, MD  Perampanel (FYCOMPA) 10 MG TABS Take 10 mg by mouth daily. 11/20/15   Elliot Cousin, MD  rizatriptan (MAXALT) 10 MG tablet Take 10 mg by mouth as needed for migraine. Reported on 09/20/2015  Historical Provider, MD    Family History Family History  Problem Relation Age of Onset  . Hypertension Mother     Social History Social History  Substance Use Topics  . Smoking status: Current Every Day Smoker    Packs/day: 1.00    Years: 10.00    Types: Cigarettes  . Smokeless tobacco: Never Used  . Alcohol use 1.2 oz/week    2 Cans of beer per week     Comment: occasionally     Allergies   Morphine   Review of Systems Review of Systems  Skin: Negative for wound.  Neurological: Positive for seizures.  All other systems reviewed and are negative.    Physical Exam Updated Vital Signs BP 117/81 (BP Location: Right Arm)    Pulse 92   Temp 98.4 F (36.9 C) (Oral)   Resp 14   Ht 5\' 2"  (1.575 m)   Wt 173 lb (78.5 kg)   SpO2 100%   BMI 31.64 kg/m   Physical Exam  Constitutional: She is oriented to person, place, and time. She appears well-developed and well-nourished.  HENT:  Head: Normocephalic and atraumatic.  Eyes: Conjunctivae and EOM are normal. Pupils are equal, round, and reactive to light.  Neck: Normal range of motion and phonation normal. Neck supple.  Cardiovascular: Normal rate, regular rhythm and normal heart sounds.   Pulmonary/Chest: Effort normal and breath sounds normal. She exhibits no tenderness.  Abdominal: Soft. She exhibits no distension. There is no tenderness. There is no guarding.  Musculoskeletal: Normal range of motion.  Neurological: She is alert and oriented to person, place, and time. She exhibits normal muscle tone.  Skin: Skin is warm and dry.  Psychiatric: She has a normal mood and affect. Her behavior is normal. Judgment and thought content normal.  Nursing note and vitals reviewed.    ED Treatments / Results  DIAGNOSTIC STUDIES:  Oxygen Saturation is 100% on room air, normal by my interpretation.    COORDINATION OF CARE:  8:18 AM Discussed treatment plan with pt at bedside, which includes blood work and dose of Vimpat, and pt agreed to plan.  Labs (all labs ordered are listed, but only abnormal results are displayed) Labs Reviewed - No data to display  EKG  EKG Interpretation None       Radiology No results found.  Procedures Procedures (including critical care time)  Medications Ordered in ED Medications - No data to display   Initial Impression / Assessment and Plan / ED Course  I have reviewed the triage vital signs and the nursing notes.  Pertinent labs & imaging results that were available during my care of the patient were reviewed by me and considered in my medical decision making (see chart for details).  Clinical Course     Medications  lacosamide (VIMPAT) tablet 50 mg (50 mg Oral Given 04/02/16 0829)    Patient Vitals for the past 24 hrs:  BP Temp Temp src Pulse Resp SpO2 Height Weight  04/02/16 1113 121/80 - - 70 15 99 % - -  04/02/16 0946 - - - 72 16 100 % - -  04/02/16 0900 110/89 - - 77 20 98 % - -  04/02/16 0830 103/77 - - 75 13 99 % - -  04/02/16 0756 117/81 98.4 F (36.9 C) Oral 92 14 100 % - -  04/02/16 0755 - - - - - - 5\' 2"  (1.575 m) 173 lb (78.5 kg)    She left AMA prior to  disposition    Final Clinical Impressions(s) / ED Diagnoses   Final diagnoses:  Seizure (HCC)   Seizure, recurrent, with medication noncompliance. She did not start her new medications days ago, which was prescribed by her neurologist. Doubt metabolic instability or serious bacterial infection  Nursing Notes Reviewed/ Care Coordinated Applicable Imaging Reviewed Interpretation of Laboratory Data incorporated into ED treatment  The patient appears reasonably screened and/or stabilized for discharge and I doubt any other medical condition or other [facility] requiring further screening, evaluation, or treatment in the ED at this time prior to discharge.  Plan: Home Medications- Continue; Home Treatments- rest; return here if the recommended treatment, does not improve the symptoms; Recommended follow up- PCP prn   New Prescriptions New Prescriptions   No medications on file  I personally performed the services described in this documentation, which was scribed in my presence. The recorded information has been reviewed and is accurate.       Elizabeth Bale, MD 04/02/16 1536

## 2016-04-02 NOTE — ED Notes (Signed)
Pt wanting to leave [facility] due to her ride having to leave.  Pt is ambulatory at discharge with mother. Pt verbalizes understanding of risks of leaving [facility].  Dr. Effie ShyWentz notified.

## 2016-04-02 NOTE — ED Triage Notes (Signed)
Per EMS: Pt had witnessed seizure 4-5 minutes, pt bit tongue and was postictal on EMS arrival.  Pt not postictal at this time, pt alert and oriented.  Pt has hx of seizure.  Pt has been taking medication but forgot to take medication last night.  cbg [id number]

## 2016-04-02 NOTE — Telephone Encounter (Signed)
Per Dr Marjory LiesPenumalli, spoke with patient and informed her that her lab results are unremarkable; he will continue with her current treatment plan. She verbalized understanding, appreciation.

## 2016-05-14 ENCOUNTER — Encounter: Payer: Medicare Other | Admitting: *Deleted

## 2016-05-14 NOTE — Progress Notes (Signed)
Pt forgot shot. To reschedule tomorrow am. JSY

## 2016-05-15 ENCOUNTER — Ambulatory Visit: Payer: Medicare Other

## 2016-05-17 ENCOUNTER — Ambulatory Visit (INDEPENDENT_AMBULATORY_CARE_PROVIDER_SITE_OTHER): Payer: Medicare Other | Admitting: *Deleted

## 2016-05-17 ENCOUNTER — Encounter: Payer: Self-pay | Admitting: *Deleted

## 2016-05-17 DIAGNOSIS — Z308 Encounter for other contraceptive management: Secondary | ICD-10-CM

## 2016-05-17 DIAGNOSIS — Z3202 Encounter for pregnancy test, result negative: Secondary | ICD-10-CM | POA: Diagnosis not present

## 2016-05-17 LAB — POCT URINE PREGNANCY: Preg Test, Ur: NEGATIVE

## 2016-05-17 MED ORDER — MEDROXYPROGESTERONE ACETATE 150 MG/ML IM SUSP
150.0000 mg | Freq: Once | INTRAMUSCULAR | Status: AC
  Administered 2016-05-17: 150 mg via INTRAMUSCULAR

## 2016-05-17 NOTE — Progress Notes (Signed)
Pt here for Depo. Pt tolerated shot well. Return in 12 weeks for next shot. JSY 

## 2016-05-23 ENCOUNTER — Encounter: Payer: Self-pay | Admitting: Family Medicine

## 2016-05-23 ENCOUNTER — Ambulatory Visit: Payer: Medicare Other | Admitting: Family Medicine

## 2016-05-28 DIAGNOSIS — Z79899 Other long term (current) drug therapy: Secondary | ICD-10-CM | POA: Diagnosis not present

## 2016-05-28 DIAGNOSIS — F17200 Nicotine dependence, unspecified, uncomplicated: Secondary | ICD-10-CM | POA: Diagnosis not present

## 2016-05-28 DIAGNOSIS — G43909 Migraine, unspecified, not intractable, without status migrainosus: Secondary | ICD-10-CM | POA: Diagnosis not present

## 2016-05-28 DIAGNOSIS — G4459 Other complicated headache syndrome: Secondary | ICD-10-CM | POA: Diagnosis not present

## 2016-05-28 DIAGNOSIS — G40919 Epilepsy, unspecified, intractable, without status epilepticus: Secondary | ICD-10-CM | POA: Diagnosis not present

## 2016-05-31 ENCOUNTER — Encounter: Payer: Self-pay | Admitting: Diagnostic Neuroimaging

## 2016-06-27 ENCOUNTER — Other Ambulatory Visit: Payer: Self-pay

## 2016-06-27 MED ORDER — FAMCICLOVIR 250 MG PO TABS: 250.0000 mg | ORAL_TABLET | Freq: Two times a day (BID) | ORAL | 1 refills | Status: DC

## 2016-07-01 ENCOUNTER — Ambulatory Visit: Payer: Medicare Other | Admitting: Diagnostic Neuroimaging

## 2016-07-26 ENCOUNTER — Emergency Department (HOSPITAL_COMMUNITY)
Admission: EM | Admit: 2016-07-26 | Discharge: 2016-07-26 | Disposition: A | Discharge status: Home / Self Care | Payer: Medicare Other | Attending: Emergency Medicine | Admitting: Emergency Medicine

## 2016-07-26 ENCOUNTER — Emergency Department (HOSPITAL_COMMUNITY)
Admission: EM | Admit: 2016-07-26 | Discharge: 2016-07-26 | Disposition: A | Discharge status: Home / Self Care | Payer: Medicare Other | Source: Home / Self Care

## 2016-07-26 ENCOUNTER — Encounter (HOSPITAL_COMMUNITY): Payer: Self-pay | Admitting: Emergency Medicine

## 2016-07-26 DIAGNOSIS — R569 Unspecified convulsions: Secondary | ICD-10-CM | POA: Diagnosis not present

## 2016-07-26 DIAGNOSIS — F17210 Nicotine dependence, cigarettes, uncomplicated: Secondary | ICD-10-CM | POA: Insufficient documentation

## 2016-07-26 DIAGNOSIS — F1290 Cannabis use, unspecified, uncomplicated: Secondary | ICD-10-CM

## 2016-07-26 DIAGNOSIS — G40909 Epilepsy, unspecified, not intractable, without status epilepticus: Secondary | ICD-10-CM | POA: Insufficient documentation

## 2016-07-26 DIAGNOSIS — R404 Transient alteration of awareness: Secondary | ICD-10-CM | POA: Diagnosis not present

## 2016-07-26 DIAGNOSIS — Z5321 Procedure and treatment not carried out due to patient leaving prior to being seen by health care provider: Secondary | ICD-10-CM | POA: Insufficient documentation

## 2016-07-26 DIAGNOSIS — R000 Tachycardia, unspecified: Secondary | ICD-10-CM | POA: Diagnosis not present

## 2016-07-26 DIAGNOSIS — Z79899 Other long term (current) drug therapy: Secondary | ICD-10-CM | POA: Insufficient documentation

## 2016-07-26 LAB — URINALYSIS, ROUTINE W REFLEX MICROSCOPIC
Bilirubin Urine: NEGATIVE
GLUCOSE, UA: NEGATIVE mg/dL
HGB URINE DIPSTICK: NEGATIVE
Ketones, ur: NEGATIVE mg/dL
LEUKOCYTES UA: NEGATIVE
Nitrite: NEGATIVE
PROTEIN: 30 mg/dL — AB
Specific Gravity, Urine: 1.024 (ref 1.005–1.030)
pH: 5 (ref 5.0–8.0)

## 2016-07-26 LAB — CBC WITH DIFFERENTIAL/PLATELET
BASOS ABS: 0 10*3/uL (ref 0.0–0.1)
Basophils Relative: 0 %
EOS PCT: 0 %
Eosinophils Absolute: 0 10*3/uL (ref 0.0–0.7)
HCT: 42.3 % (ref 36.0–46.0)
Hemoglobin: 14 g/dL (ref 12.0–15.0)
LYMPHS PCT: 24 %
Lymphs Abs: 1.2 10*3/uL (ref 0.7–4.0)
MCH: 32.6 pg (ref 26.0–34.0)
MCHC: 33.1 g/dL (ref 30.0–36.0)
MCV: 98.4 fL (ref 78.0–100.0)
MONO ABS: 0.2 10*3/uL (ref 0.1–1.0)
Monocytes Relative: 4 %
Neutro Abs: 3.5 10*3/uL (ref 1.7–7.7)
Neutrophils Relative %: 72 %
PLATELETS: 377 10*3/uL (ref 150–400)
RBC: 4.3 MIL/uL (ref 3.87–5.11)
RDW: 14.3 % (ref 11.5–15.5)
WBC: 5 10*3/uL (ref 4.0–10.5)

## 2016-07-26 LAB — COMPREHENSIVE METABOLIC PANEL
ALBUMIN: 4.1 g/dL (ref 3.5–5.0)
ALK PHOS: 68 U/L (ref 38–126)
ALT: 11 U/L — ABNORMAL LOW (ref 14–54)
AST: 15 U/L (ref 15–41)
Anion gap: 5 (ref 5–15)
BILIRUBIN TOTAL: 0.5 mg/dL (ref 0.3–1.2)
BUN: 5 mg/dL — AB (ref 6–20)
CALCIUM: 8.5 mg/dL — AB (ref 8.9–10.3)
CO2: 25 mmol/L (ref 22–32)
CREATININE: 0.87 mg/dL (ref 0.44–1.00)
Chloride: 102 mmol/L (ref 101–111)
GFR calc Af Amer: >60 mL/min (ref >60)
GFR calc non Af Amer: >60 mL/min (ref >60)
GLUCOSE: 103 mg/dL — AB (ref 65–99)
Potassium: 3.4 mmol/L — ABNORMAL LOW (ref 3.5–5.1)
Sodium: 132 mmol/L — ABNORMAL LOW (ref 135–145)
TOTAL PROTEIN: 7.5 g/dL (ref 6.5–8.1)

## 2016-07-26 LAB — RAPID URINE DRUG SCREEN, HOSP PERFORMED
Amphetamines: NOT DETECTED
BARBITURATES: NOT DETECTED
BENZODIAZEPINES: NOT DETECTED
Cocaine: NOT DETECTED
Opiates: POSITIVE — AB
Tetrahydrocannabinol: POSITIVE — AB

## 2016-07-26 LAB — CBG MONITORING, ED: GLUCOSE-CAPILLARY: 104 mg/dL — AB (ref 65–99)

## 2016-07-26 LAB — ETHANOL: Alcohol, Ethyl (B): <5 mg/dL (ref <5)

## 2016-07-26 MED ORDER — AMMONIA AROMATIC IN INHA
RESPIRATORY_TRACT | Status: AC
  Filled 2016-07-26: qty 10

## 2016-07-26 MED ORDER — LORAZEPAM 2 MG/ML IJ SOLN
INTRAMUSCULAR | Status: AC
  Filled 2016-07-26: qty 1

## 2016-07-26 MED ORDER — LACOSAMIDE 50 MG PO TABS: 50.0000 mg | ORAL_TABLET | Freq: Two times a day (BID) | ORAL | 0 refills | Status: DC

## 2016-07-26 MED ORDER — ACETAMINOPHEN 325 MG PO TABS
650.0000 mg | ORAL_TABLET | Freq: Once | ORAL | Status: AC
  Administered 2016-07-26: 650 mg via ORAL
  Filled 2016-07-26: qty 2
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
## 2016-07-26 NOTE — ED Notes (Signed)
Pt's upper body shaking and mouth is moving in chewing motion.  Pt hr up to 150's-160's.  EDP called to bedside

## 2016-07-26 NOTE — ED Notes (Signed)
Patient left waiting room at 1322 after triage stating tired of waiting.

## 2016-07-26 NOTE — ED Triage Notes (Signed)
Pt in waiting room earlier today, LWBS.  approx 1 hour after leaving ED waiting room pt called 911 reporting seizure activity. Seizure was not witnessed by ems.  BG 146  Pt is a&o x 4 at this time.
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
## 2016-07-26 NOTE — ED Triage Notes (Addendum)
Pt reports family reports "sezizures x3 times" today. Pt does not remember events related to seizures. Pt reports is currently taking seizure medication daily and has appointment with [facility] but was unable to attend appointment. Pt reports sezuires last week as well. nad noted.

## 2016-07-26 NOTE — ED Notes (Signed)
Immediately upon putting pt into room, pt states she was already seen today and would like to leave.  Pt a/o to person, place, and time.  Dr Effie ShyWentz made aware.
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
## 2016-07-26 NOTE — ED Notes (Addendum)
Called into room by pharmacy,  Pt was growling and ripping the tape off of her IV site.  Questioned pt as to what she was doing and pt did not make eye contact or respond.  Pt continued to pull iv out of arm.  immediatly after pt states she does not remember the incident.  Pt is A&o x 4 at this time.  EDP notified.

## 2016-07-26 NOTE — Discharge Instructions (Signed)
It is important to take all of your seizure medicines as directed.  Dr. Marjory LiesPenumalli, prescribed Vimpat (lacosamide) . When you saw him in September 2017. It is important to take that medicine. We will give you a prescription to make sure that you have it.  Call your neurologist on Monday for an appointment to be seen next week.  If you develop problems with seizures, go to [facility] in [place], or [facility] in [place]. She needed immediate help, call 911.  Please maintain seizure precautions. Do not participate in activities where a loss of awareness could harm you or someone else. No swimming alone, no tub bathing, no hot tubs, no driving, no operating motorized vehicles (cars, ATVs, motocycles, etc), lawnmowers or power tools. No standing at heights, such as rooftops, ladders or stairs. Avoid hot objects such as stoves, heaters, open fires. Wear a helmet when riding a bicycle, scooter, skateboard, etc. and avoid areas of traffic. Set your water heater to 120 degrees or less.

## 2016-07-29 ENCOUNTER — Ambulatory Visit: Payer: Medicare Other

## 2016-08-05 ENCOUNTER — Ambulatory Visit (INDEPENDENT_AMBULATORY_CARE_PROVIDER_SITE_OTHER): Payer: Medicare Other

## 2016-08-05 VITALS — BP 130/82 | HR 70 | Temp 98.7°F | Ht 61.0 in | Wt 164.1 lb

## 2016-08-05 DIAGNOSIS — E559 Vitamin D deficiency, unspecified: Secondary | ICD-10-CM | POA: Diagnosis not present

## 2016-08-05 DIAGNOSIS — Z23 Encounter for immunization: Secondary | ICD-10-CM | POA: Diagnosis not present

## 2016-08-05 DIAGNOSIS — Z0000 Encounter for general adult medical examination without abnormal findings: Secondary | ICD-10-CM

## 2016-08-05 DIAGNOSIS — F339 Major depressive disorder, recurrent, unspecified: Secondary | ICD-10-CM

## 2016-08-05 DIAGNOSIS — Z13220 Encounter for screening for lipoid disorders: Secondary | ICD-10-CM

## 2016-08-05 DIAGNOSIS — Z79899 Other long term (current) drug therapy: Secondary | ICD-10-CM

## 2016-08-05 NOTE — Progress Notes (Signed)
Subjective:   [name] is a 36 y.o. female who presents for an Initial Medicare Annual Wellness Visit.  Review of Systems    Cardiac Risk Factors include: obesity (BMI >30kg/m2);sedentary lifestyle;smoking/ tobacco exposure     Objective:    Today's Vitals   08/05/16 1507  BP: 130/82  Pulse: 70  Temp: 98.7 F (37.1 C)  TempSrc: Oral  SpO2: 98%  Weight: 164 lb 1.9 oz (74.4 kg)  Height: 5\' 1"  (1.549 m)   Body mass index is 31.01 kg/m.   Current Medications (verified) Outpatient Encounter Prescriptions as of 08/05/2016  Medication Sig  . famciclovir (FAMVIR) 250 MG tablet Take 1 tablet (250 mg total) by mouth 2 (two) times daily. For itch (Patient taking differently: Take 250 mg by mouth daily. For itch)  . FLUoxetine (PROZAC) 20 MG capsule Take 1 capsule (20 mg total) by mouth daily.  . FYCOMPA 12 MG TABS Take 1 tablet by mouth daily.  [name]. HYDROcodone-acetaminophen (NORCO) 7.5-325 MG tablet Take 1 tablet by mouth daily as needed (Pain).   [name]. lacosamide (VIMPAT) 50 MG TABS tablet Take 1 tablet (50 mg total) by mouth 2 (two) times daily.  . LamoTRIgine 250 MG TB24 Takes 2 tabs at bedtime.  . medroxyPROGESTERone (DEPO-PROVERA) 150 MG/ML injection INJECT 1 ML IN THE MUSCLE ONCE EVERY 3 MONTHS AS DIRECTED  . methocarbamol (ROBAXIN) 500 MG tablet Take 1 tablet by mouth daily as needed for muscle spasms.  . rizatriptan (MAXALT-MLT) 10 MG disintegrating tablet Take 10 mg by mouth as needed for migraine. Reported on 09/20/2015  . [DISCONTINUED] Perampanel (FYCOMPA) 10 MG TABS Take 10 mg by mouth daily. (Patient not taking: Reported on 07/26/2016)   No facility-administered encounter medications on file as of 08/05/2016.     Allergies (verified) Morphine   History: Past Medical History:  Diagnosis Date  . Back pain   . Chronic headaches   . Depression   . Genital warts    herpes  . Herpes   . Seizures (HCC) 03/15/2016   Past Surgical History:  Procedure Laterality Date    . CESAREAN SECTION     x 3   Family History  Problem Relation Age of Onset  . Hypertension Mother    Social History   Occupational History  .      na   Social History Main Topics  . Smoking status: Current Every Day Smoker    Packs/day: 1.00    Years: 10.00    Types: Cigarettes  . Smokeless tobacco: Never Used  . Alcohol use 1.2 oz/week    2 Cans of beer per week     Comment: occasionally  . Drug use:     Types: Marijuana     Comment: 3 times a week, 08/05/2016 "not often"  . Sexual activity: Yes    Birth control/ protection: Injection    Tobacco Counseling Ready to quit: Yes Counseling given: Yes   Activities of Daily Living In your present state of health, do you have any difficulty performing the following activities: 08/05/2016 11/19/2015  Hearing? N N  Vision? N N  Difficulty concentrating or making decisions? N Y  Walking or climbing stairs? N N  Dressing or bathing? N N  Doing errands, shopping? N N  Preparing Food and eating ? N -  Using the Toilet? N -  In the past six months, have you accidently leaked urine? N -  Do you have problems with loss of bowel control? N -  Managing your Medications? N -  Managing your Finances? N -  Housekeeping or managing your Housekeeping? N -  Some recent data might be hidden    Immunizations and Health Maintenance Immunization History  Administered Date(s) Administered  . Influenza Split 04/22/2012  . Influenza Whole 05/26/2009  . Influenza,inj,Quad PF,36+ Mos 04/22/2013, 06/01/2014, 09/20/2015, 08/05/2016  . Td 07/27/2009  . Tdap 09/21/2011   There are no preventive care reminders to display for this patient.  Patient Care Team: Kerri Perches, MD as PCP - General Beryle Beams, MD as Consulting Physician (Neurology) Tilda Burrow, MD as Consulting Physician (Obstetrics and Gynecology)  Indicate any recent Medical Services you may have received from other than [facility] providers in the past year (date may  be approximate).     Assessment:   This is a routine wellness examination for [name].   Hearing/Vision screen No exam data present  Dietary issues and exercise activities discussed: Current Exercise Habits: The patient does not participate in regular exercise at present, Exercise limited by: None identified  Goals      Patient Stated   . Increase water intake (pt-stated)          She would like to start drinking 32 ounces of water a day.    . Quit smoking / using tobacco (pt-stated)          She would like to cut back to half a pack of cigarettes a day and slowly wean herself off completely.      Depression Screen PHQ 2/9 Scores 08/05/2016 02/20/2016 09/20/2015 08/15/2014 06/01/2014 08/09/2013  PHQ - 2 Score 0 5 5 4 5 6   PHQ- 9 Score - 19 21 10 14 22     Fall Risk Fall Risk  08/05/2016 08/09/2013  Falls in the past year? Yes Yes  Number falls in past yr: 2 or more 2 or more  Injury with Fall? No -  Risk for fall due to : Other (Comment) History of fall(s)  Risk for fall due to (comments): siezures uncontrolled seizures    Cognitive Function:  Normal 6CIT Screen 08/05/2016  What Year? 0 points  What month? 0 points  What time? 0 points  Count back from 20 0 points  Months in reverse 0 points  Repeat phrase 0 points  Total Score 0  by direct observation.  Screening Tests Health Maintenance  Topic Date Due  . PAP SMEAR  02/16/2019  . TETANUS/TDAP  09/20/2021  . INFLUENZA VACCINE  Completed  . HIV Screening  Completed      Plan:    I have personally reviewed and addressed the Medicare Annual Wellness questionnaire and have noted the following in the patient's chart:  A. Medical and social history B. Use of alcohol, tobacco or illicit drugs  C. Current medications and supplements D. Functional ability and status E.  Nutritional status F.  Physical activity G. Advance directives H. List of other physicians I.  Hospitalizations, surgeries, and ER visits in  previous  12 months J.  Vitals K. Screenings to include hearing, vision, cognitive,  depression L. Referrals and appointments - none  In addition, I have reviewed and discussed with patient certain preventive protocols, quality metrics, and best practice recommendations. A written personalized care plan for preventive services as well as general preventive health recommendations were provided to patient.  Signed,   Candis Shine, LPN Lead Nurse Health Advisor

## 2016-08-05 NOTE — Patient Instructions (Addendum)
Health maintenance: Flu vaccine administered today.   Abnormal screenings: None   Patient concerns: Unable to fall asleep at night, recommend follow up with Dr. Moshe Cipro in one week.   Nurse concerns: Weight and smoking. Recommend increasing exercise to at least 3 times a week for at least 30 minutes at a time. Recommend quitting smoking,   Next PCP appt: In one week with Dr. Moshe Cipro to discuss trouble falling asleep.  Advance directive discussed with patient today. Copy provided for patient to complete at home and have notarized. Patient agrees to have copy sent to our office once it is complete.    Health Maintenance, Female Introduction Adopting a healthy lifestyle and getting preventive care can go a long way to promote health and wellness. Talk with your health care provider about what schedule of regular examinations is right for you. This is a good chance for you to check in with your provider about disease prevention and staying healthy. In between checkups, there are plenty of things you can do on your own. Experts have done a lot of research about which lifestyle changes and preventive measures are most likely to keep you healthy. Ask your health care provider for more information. Weight and diet Eat a healthy diet  Be sure to include plenty of vegetables, fruits, low-fat dairy products, and lean protein.  Do not eat a lot of foods high in solid fats, added sugars, or salt.  Get regular exercise. This is one of the most important things you can do for your health.  Most adults should exercise for at least 150 minutes each week. The exercise should increase your heart rate and make you sweat (moderate-intensity exercise).  Most adults should also do strengthening exercises at least twice a week. This is in addition to the moderate-intensity exercise. Maintain a healthy weight  Body mass index (BMI) is a measurement that can be used to identify possible weight problems. It  estimates body fat based on height and weight. Your health care provider can help determine your BMI and help you achieve or maintain a healthy weight.  For females 53 years of age and older:  A BMI below 18.5 is considered underweight.  A BMI of 18.5 to 24.9 is normal.  A BMI of 25 to 29.9 is considered overweight.  A BMI of 30 and above is considered obese. Watch levels of cholesterol and blood lipids  You should start having your blood tested for lipids and cholesterol at 38 years of age, then have this test every 5 years.  You may need to have your cholesterol levels checked more often if:  Your lipid or cholesterol levels are high.  You are older than 38 years of age.  You are at high risk for heart disease. Cancer screening Breast Cancer  Practice breast self-awareness. This means understanding how your breasts normally appear and feel.  It also means doing regular breast self-exams. Let your health care provider know about any changes, no matter how small.  If you are in your 20s or 30s, you should have a clinical breast exam (CBE) by a health care provider every 1-3 years as part of a regular health exam.  If you are 7 or older, have a CBE every year. Also consider having a breast X-ray (mammogram) every year.  If you have a family history of breast cancer, talk to your health care provider about genetic screening.  If you are at high risk for breast cancer, talk to your health  care provider about having an MRI and a mammogram every year.  Breast cancer gene (BRCA) assessment is recommended for women who have family members with BRCA-related cancers. BRCA-related cancers include:  Breast.  Ovarian.  Tubal.  Peritoneal cancers.  Results of the assessment will determine the need for genetic counseling and BRCA1 and BRCA2 testing. Cervical Cancer  Your health care provider may recommend that you be screened regularly for cancer of the pelvic organs (ovaries,  uterus, and vagina). This screening involves a pelvic examination, including checking for microscopic changes to the surface of your cervix (Pap test). You may be encouraged to have this screening done every 3 years, beginning at age 28.  For women ages 36-65, health care providers may recommend pelvic exams and Pap testing every 3 years, or they may recommend the Pap and pelvic exam, combined with testing for human papilloma virus (HPV), every 5 years. Some types of HPV increase your risk of cervical cancer. Testing for HPV may also be done on women of any age with unclear Pap test results.  Other health care providers may not recommend any screening for nonpregnant women who are considered low risk for pelvic cancer and who do not have symptoms. Ask your health care provider if a screening pelvic exam is right for you.  If you have had past treatment for cervical cancer or a condition that could lead to cancer, you need Pap tests and screening for cancer for at least 20 years after your treatment. If Pap tests have been discontinued, your risk factors (such as having a new sexual partner) need to be reassessed to determine if screening should resume. Some women have medical problems that increase the chance of getting cervical cancer. In these cases, your health care provider may recommend more frequent screening and Pap tests. Skin Cancer  Check your skin from head to toe regularly.  Tell your health care provider about any new moles or changes in moles, especially if there is a change in a mole's shape or color.  Also tell your health care provider if you have a mole that is larger than the size of a pencil eraser.  Always use sunscreen. Apply sunscreen liberally and repeatedly throughout the day.  Protect yourself by wearing long sleeves, pants, a wide-brimmed hat, and sunglasses whenever you are outside. Heart disease, diabetes, and high blood pressure  High blood pressure causes heart  disease and increases the risk of stroke. High blood pressure is more likely to develop in:  People who have blood pressure in the high end of the normal range (130-139/85-89 mm Hg).  People who are overweight or obese.  People who are African American.  If you are 35-54 years of age, have your blood pressure checked every 3-5 years. If you are 50 years of age or older, have your blood pressure checked every year. You should have your blood pressure measured twice-once when you are at a hospital or clinic, and once when you are not at a hospital or clinic. Record the average of the two measurements. To check your blood pressure when you are not at a hospital or clinic, you can use:  An automated blood pressure machine at a pharmacy.  A home blood pressure monitor.  If you are between 83 years and 80 years old, ask your health care provider if you should take aspirin to prevent strokes.  Have regular diabetes screenings. This involves taking a blood sample to check your fasting blood sugar level.  If you are at a normal weight and have a low risk for diabetes, have this test once every three years after 38 years of age.  If you are overweight and have a high risk for diabetes, consider being tested at a younger age or more often. Preventing infection Hepatitis B  If you have a higher risk for hepatitis B, you should be screened for this virus. You are considered at high risk for hepatitis B if:  You were born in a country where hepatitis B is common. Ask your health care provider which countries are considered high risk.  Your parents were born in a high-risk country, and you have not been immunized against hepatitis B (hepatitis B vaccine).  You have HIV or AIDS.  You use needles to inject street drugs.  You live with someone who has hepatitis B.  You have had sex with someone who has hepatitis B.  You get hemodialysis treatment.  You take certain medicines for conditions,  including cancer, organ transplantation, and autoimmune conditions. Hepatitis C  Blood testing is recommended for:  Everyone born from [birth year] through 1967.  Anyone with known risk factors for hepatitis C. Sexually transmitted infections (STIs)  You should be screened for sexually transmitted infections (STIs) including gonorrhea and chlamydia if:  You are sexually active and are younger than 38 years of age.  You are older than 38 years of age and your health care provider tells you that you are at risk for this type of infection.  Your sexual activity has changed since you were last screened and you are at an increased risk for chlamydia or gonorrhea. Ask your health care provider if you are at risk.  If you do not have HIV, but are at risk, it may be recommended that you take a prescription medicine daily to prevent HIV infection. This is called pre-exposure prophylaxis (PrEP). You are considered at risk if:  You are sexually active and do not regularly use condoms or know the HIV status of your partner(s).  You take drugs by injection.  You are sexually active with a partner who has HIV. Talk with your health care provider about whether you are at high risk of being infected with HIV. If you choose to begin PrEP, you should first be tested for HIV. You should then be tested every 3 months for as long as you are taking PrEP. Pregnancy  If you are premenopausal and you may become pregnant, ask your health care provider about preconception counseling.  If you may become pregnant, take 400 to 800 micrograms (mcg) of folic acid every day.  If you want to prevent pregnancy, talk to your health care provider about birth control (contraception). Follow these instructions at home:  Schedule regular health, dental, and eye exams.  Stay current with your immunizations.  Do not use any tobacco products including cigarettes, chewing tobacco, or electronic cigarettes.  If you are  pregnant, do not drink alcohol.  If you are breastfeeding, limit how much and how often you drink alcohol.  Limit alcohol intake to no more than 1 drink per day for nonpregnant women. One drink equals 12 ounces of beer, 5 ounces of wine, or 1 ounces of hard liquor.  Do not use street drugs.  Do not share needles.  Ask your health care provider for help if you need support or information about quitting drugs.  Tell your health care provider if you often feel depressed.  Tell your health care provider if  you have ever been abused or do not feel safe at home. This information is not intended to replace advice given to you by your health care provider. Make sure you discuss any questions you have with your health care provider. Document Released: 01/21/2011 Document Revised: 12/14/2015 Document Reviewed: 04/11/2015  2017 Elsevier  Steps to Quit Smoking Smoking tobacco can be bad for your health. It can also affect almost every organ in your body. Smoking puts you and people around you at risk for many serious long-lasting (chronic) diseases. Quitting smoking is hard, but it is one of the best things that you can do for your health. It is never too late to quit. What are the benefits of quitting smoking? When you quit smoking, you lower your risk for getting serious diseases and conditions. They can include:  Lung cancer or lung disease.  Heart disease.  Stroke.  Heart attack.  Not being able to have children (infertility).  Weak bones (osteoporosis) and broken bones (fractures). If you have coughing, wheezing, and shortness of breath, those symptoms may get better when you quit. You may also get sick less often. If you are pregnant, quitting smoking can help to lower your chances of having a baby of low birth weight. What can I do to help me quit smoking? Talk with your doctor about what can help you quit smoking. Some things you can do (strategies) include:  Quitting smoking  totally, instead of slowly cutting back how much you smoke over a period of time.  Going to in-person counseling. You are more likely to quit if you go to many counseling sessions.  Using resources and support systems, such as:  Online chats with a Social worker.  Phone quitlines.  Printed Furniture conservator/restorer.  Support groups or group counseling.  Text messaging programs.  Mobile phone apps or applications.  Taking medicines. Some of these medicines may have nicotine in them. If you are pregnant or breastfeeding, do not take any medicines to quit smoking unless your doctor says it is okay. Talk with your doctor about counseling or other things that can help you. Talk with your doctor about using more than one strategy at the same time, such as taking medicines while you are also going to in-person counseling. This can help make quitting easier. What things can I do to make it easier to quit? Quitting smoking might feel very hard at first, but there is a lot that you can do to make it easier. Take these steps:  Talk to your family and friends. Ask them to support and encourage you.  Call phone quitlines, reach out to support groups, or work with a Social worker.  Ask people who smoke to not smoke around you.  Avoid places that make you want (trigger) to smoke, such as:  Bars.  Parties.  Smoke-break areas at work.  Spend time with people who do not smoke.  Lower the stress in your life. Stress can make you want to smoke. Try these things to help your stress:  Getting regular exercise.  Deep-breathing exercises.  Yoga.  Meditating.  Doing a body scan. To do this, close your eyes, focus on one area of your body at a time from head to toe, and notice which parts of your body are tense. Try to relax the muscles in those areas.  Download or buy apps on your mobile phone or tablet that can help you stick to your quit plan. There are many free apps, such as QuitGuide from  the CDC  [employer] for Disease Control and Prevention). You can find more support from smokefree.gov and other websites. This information is not intended to replace advice given to you by your health care provider. Make sure you discuss any questions you have with your health care provider. Document Released: 05/04/2009 Document Revised: 03/05/2016 Document Reviewed: 11/22/2014 Elsevier Interactive Patient Education  2017 [employer].

## 2016-08-09 DIAGNOSIS — R9401 Abnormal electroencephalogram [EEG]: Secondary | ICD-10-CM | POA: Diagnosis not present

## 2016-08-09 DIAGNOSIS — R51 Headache: Secondary | ICD-10-CM | POA: Diagnosis not present

## 2016-08-09 DIAGNOSIS — G40009 Localization-related (focal) (partial) idiopathic epilepsy and epileptic syndromes with seizures of localized onset, not intractable, without status epilepticus: Secondary | ICD-10-CM | POA: Diagnosis not present

## 2016-08-09 DIAGNOSIS — R569 Unspecified convulsions: Secondary | ICD-10-CM | POA: Diagnosis not present

## 2016-08-09 DIAGNOSIS — B3789 Other sites of candidiasis: Secondary | ICD-10-CM | POA: Diagnosis not present

## 2016-08-09 DIAGNOSIS — F419 Anxiety disorder, unspecified: Secondary | ICD-10-CM | POA: Diagnosis not present

## 2016-08-09 DIAGNOSIS — K0889 Other specified disorders of teeth and supporting structures: Secondary | ICD-10-CM | POA: Diagnosis not present

## 2016-08-09 DIAGNOSIS — Z720 Tobacco use: Secondary | ICD-10-CM | POA: Diagnosis not present

## 2016-08-09 DIAGNOSIS — G40109 Localization-related (focal) (partial) symptomatic epilepsy and epileptic syndromes with simple partial seizures, not intractable, without status epilepticus: Secondary | ICD-10-CM | POA: Diagnosis present

## 2016-08-09 DIAGNOSIS — F329 Major depressive disorder, single episode, unspecified: Secondary | ICD-10-CM | POA: Diagnosis not present

## 2016-08-15 ENCOUNTER — Ambulatory Visit: Payer: Medicare Other | Admitting: Family Medicine

## 2016-08-19 ENCOUNTER — Ambulatory Visit (INDEPENDENT_AMBULATORY_CARE_PROVIDER_SITE_OTHER): Payer: Medicare Other | Admitting: *Deleted

## 2016-08-19 ENCOUNTER — Encounter: Payer: Self-pay | Admitting: *Deleted

## 2016-08-19 DIAGNOSIS — Z3202 Encounter for pregnancy test, result negative: Secondary | ICD-10-CM

## 2016-08-19 DIAGNOSIS — Z3042 Encounter for surveillance of injectable contraceptive: Secondary | ICD-10-CM | POA: Diagnosis not present

## 2016-08-19 DIAGNOSIS — Z308 Encounter for other contraceptive management: Secondary | ICD-10-CM

## 2016-08-19 LAB — POCT URINE PREGNANCY: Preg Test, Ur: NEGATIVE

## 2016-08-19 MED ORDER — MEDROXYPROGESTERONE ACETATE 150 MG/ML IM SUSP
150.0000 mg | Freq: Once | INTRAMUSCULAR | Status: AC
  Administered 2016-08-19: 150 mg via INTRAMUSCULAR

## 2016-08-19 NOTE — Progress Notes (Signed)
Pt here for Depo. Pt is 3 days late getting shot. I spoke with Dr. Despina HiddenEure . He advised ok to give shot today. Last time pt had sex was 6-8 months ago. Pt tolerated shot well. Return in 12 weeks for next shot. JSY

## 2016-08-26 DIAGNOSIS — F17200 Nicotine dependence, unspecified, uncomplicated: Secondary | ICD-10-CM | POA: Diagnosis not present

## 2016-08-26 DIAGNOSIS — Z79899 Other long term (current) drug therapy: Secondary | ICD-10-CM | POA: Diagnosis not present

## 2016-08-26 DIAGNOSIS — G43909 Migraine, unspecified, not intractable, without status migrainosus: Secondary | ICD-10-CM | POA: Diagnosis not present

## 2016-08-26 DIAGNOSIS — G40919 Epilepsy, unspecified, intractable, without status epilepticus: Secondary | ICD-10-CM | POA: Diagnosis not present

## 2016-08-26 DIAGNOSIS — G4459 Other complicated headache syndrome: Secondary | ICD-10-CM | POA: Diagnosis not present

## 2016-11-11 ENCOUNTER — Encounter: Payer: Self-pay | Admitting: *Deleted

## 2016-11-11 ENCOUNTER — Ambulatory Visit (INDEPENDENT_AMBULATORY_CARE_PROVIDER_SITE_OTHER): Payer: Medicare Other | Admitting: *Deleted

## 2016-11-11 DIAGNOSIS — Z3042 Encounter for surveillance of injectable contraceptive: Secondary | ICD-10-CM

## 2016-11-11 DIAGNOSIS — Z308 Encounter for other contraceptive management: Secondary | ICD-10-CM

## 2016-11-11 DIAGNOSIS — Z3202 Encounter for pregnancy test, result negative: Secondary | ICD-10-CM

## 2016-11-11 LAB — POCT URINE PREGNANCY: PREG TEST UR: NEGATIVE

## 2016-11-11 MED ORDER — MEDROXYPROGESTERONE ACETATE 150 MG/ML IM SUSP
150.0000 mg | Freq: Once | INTRAMUSCULAR | Status: AC
  Administered 2016-11-11: 150 mg via INTRAMUSCULAR

## 2016-11-11 NOTE — Progress Notes (Signed)
Pt here for Depo. Pt tolerated shot well. Return in 12 weeks for next shot. JSY 

## 2016-11-12 ENCOUNTER — Other Ambulatory Visit: Payer: Self-pay

## 2016-11-12 ENCOUNTER — Telehealth: Payer: Self-pay | Admitting: Family Medicine

## 2016-11-12 MED ORDER — FAMCICLOVIR 250 MG PO TABS: 250.0000 mg | ORAL_TABLET | Freq: Two times a day (BID) | ORAL | 1 refills | Status: DC

## 2016-11-12 NOTE — Telephone Encounter (Signed)
Refilled

## 2016-11-12 NOTE — Telephone Encounter (Signed)
Patient is requesting refill of famciclobir   cb#: [phone]  Pharmacy: [facility]

## 2016-11-18 ENCOUNTER — Emergency Department (HOSPITAL_COMMUNITY)
Admission: EM | Admit: 2016-11-18 | Discharge: 2016-11-18 | Disposition: A | Discharge status: Home / Self Care | Payer: Medicare Other | Attending: Emergency Medicine | Admitting: Emergency Medicine

## 2016-11-18 ENCOUNTER — Encounter (HOSPITAL_COMMUNITY): Payer: Self-pay

## 2016-11-18 DIAGNOSIS — F17210 Nicotine dependence, cigarettes, uncomplicated: Secondary | ICD-10-CM | POA: Diagnosis not present

## 2016-11-18 DIAGNOSIS — G40909 Epilepsy, unspecified, not intractable, without status epilepticus: Secondary | ICD-10-CM | POA: Diagnosis not present

## 2016-11-18 DIAGNOSIS — R569 Unspecified convulsions: Secondary | ICD-10-CM

## 2016-11-18 DIAGNOSIS — Z79899 Other long term (current) drug therapy: Secondary | ICD-10-CM | POA: Diagnosis not present

## 2016-11-18 DIAGNOSIS — F4489 Other dissociative and conversion disorders: Secondary | ICD-10-CM | POA: Diagnosis not present

## 2016-11-18 LAB — CBC WITH DIFFERENTIAL/PLATELET
BASOS ABS: 0 10*3/uL (ref 0.0–0.1)
BASOS PCT: 0 %
Eosinophils Absolute: 0.1 10*3/uL (ref 0.0–0.7)
Eosinophils Relative: 1 %
HEMATOCRIT: 39.9 % (ref 36.0–46.0)
Hemoglobin: 13.6 g/dL (ref 12.0–15.0)
LYMPHS PCT: 26 %
Lymphs Abs: 1.6 10*3/uL (ref 0.7–4.0)
MCH: 32.6 pg (ref 26.0–34.0)
MCHC: 34.1 g/dL (ref 30.0–36.0)
MCV: 95.7 fL (ref 78.0–100.0)
MONO ABS: 0.6 10*3/uL (ref 0.1–1.0)
Monocytes Relative: 9 %
NEUTROS ABS: 3.9 10*3/uL (ref 1.7–7.7)
Neutrophils Relative %: 64 %
Platelets: 320 10*3/uL (ref 150–400)
RBC: 4.17 MIL/uL (ref 3.87–5.11)
RDW: 14.4 % (ref 11.5–15.5)
WBC: 6.1 10*3/uL (ref 4.0–10.5)

## 2016-11-18 LAB — COMPREHENSIVE METABOLIC PANEL
ALBUMIN: 3.3 g/dL — AB (ref 3.5–5.0)
ALT: 12 U/L — ABNORMAL LOW (ref 14–54)
AST: 15 U/L (ref 15–41)
Alkaline Phosphatase: 67 U/L (ref 38–126)
Anion gap: 8 (ref 5–15)
BILIRUBIN TOTAL: 0.4 mg/dL (ref 0.3–1.2)
BUN: 6 mg/dL (ref 6–20)
CHLORIDE: 108 mmol/L (ref 101–111)
CO2: 22 mmol/L (ref 22–32)
Calcium: 8.6 mg/dL — ABNORMAL LOW (ref 8.9–10.3)
Creatinine, Ser: 0.74 mg/dL (ref 0.44–1.00)
GFR calc Af Amer: >60 mL/min (ref >60)
GFR calc non Af Amer: >60 mL/min (ref >60)
GLUCOSE: 92 mg/dL (ref 65–99)
POTASSIUM: 3.7 mmol/L (ref 3.5–5.1)
Sodium: 138 mmol/L (ref 135–145)
TOTAL PROTEIN: 6.3 g/dL — AB (ref 6.5–8.1)

## 2016-11-18 LAB — ETHANOL: Alcohol, Ethyl (B): <5 mg/dL (ref <5)

## 2016-11-18 NOTE — ED Notes (Signed)
Pt assisted to BR, Pt unable to void at this time,

## 2016-11-18 NOTE — ED Triage Notes (Signed)
CBG= 121

## 2016-11-18 NOTE — ED Provider Notes (Signed)
[facility]EMER[facility]ENCY DEPT Provider Note   CSN: [id number] Arrival date & time: 11/18/16  4098  By signing my name below, I, [name], attest that this documentation has been prepared under the direction and in the presence of Elizabeth Racer, MD . Electronically Signed: Majel Levine, Scribe. 11/18/2016. 3:02 PM.  History   Chief Complaint Chief Complaint  Patient presents with  . Seizures   The history is provided by the patient. No language interpreter was used.   HPI Comments: [name] is a 36 y.o. female with PMHx of seizures, brought in by EMS to the Emergency Department for an evaluation s/p a witnessed seizure that occurred this morning. Per EMS report, pt's son heard a noise coming from pt's bedroom this morning and found pt seizing in bed. He states pt's seizure lasted for ~2 minutes; however, pt has no recollection of this episode. She notes associated urinary incontinence during her seizure and states she also bit her lower lip. Pt reports her last seizure occurred 7 days ago and states she usually experiences seizures "once or twice a month." She notes she is currently taking trileptal and has not missed any doses. She denies any pain or recent changes in her medication.   Past Medical History:  Diagnosis Date  . Back pain   . Chronic headaches   . Depression   . Genital warts    herpes  . Herpes   . Seizures (HCC) 03/15/2016    Patient Active Problem List   Diagnosis Date Noted  . Status epilepticus, generalized convulsive (HCC) 11/20/2015  . Seizure (HCC) 11/19/2015  . Amenorrhea 09/28/2011  . Vitamin D deficiency 09/06/2011  . Headache 03/10/2011  . Obesity (BMI 30.0-34.9) 05/28/2009  . Seizures (HCC) 10/26/2008  . NICOTINE ADDICTION 12/10/2007  . Depression 12/10/2007    Past Surgical History:  Procedure Laterality Date  . CESAREAN SECTION     x 3    OB History    Gravida Para Term Preterm AB Living   SAB TAB Ectopic Multiple Live  Births           3     Home Medications    Prior to Admission medications   Medication Sig Start Date End Date Taking? Authorizing Provider  famciclovir (FAMVIR) 250 MG tablet Take 1 tablet (250 mg total) by mouth 2 (two) times daily. For itch 11/12/16  Yes Kerri Perches, MD  FLUoxetine (PROZAC) 20 MG capsule Take 1 capsule (20 mg total) by mouth daily. 02/20/16  Yes Kerri Perches, MD  HYDROcodone-acetaminophen (NORCO) 7.5-325 MG tablet Take 1 tablet by mouth daily as needed (Pain).  07/24/16  Yes Historical Provider, MD  lacosamide (VIMPAT) 50 MG TABS tablet Take 1 tablet (50 mg total) by mouth 2 (two) times daily. 07/26/16  Yes [name], MD  medroxyPROGESTERone (DEPO-PROVERA) 150 MG/ML injection INJECT 1 ML IN THE MUSCLE ONCE EVERY 3 MONTHS AS DIRECTED 02/01/16  Yes Adline Potter, NP  methocarbamol (ROBAXIN) 500 MG tablet Take 1 tablet by mouth daily as needed for muscle spasms. 02/27/16  Yes Historical Provider, MD  Oxcarbazepine (TRILEPTAL) 300 MG tablet Take 1,200 mg by mouth 2 (two) times daily.  08/16/16  Yes Historical Provider, MD    Family History Family History  Problem Relation Age of Onset  . Hypertension Mother     Social History Social History  Substance Use Topics  . Smoking status: Current Every Day Smoker  Packs/day: 1.00    Years: 10.00    Types: Cigarettes  . Smokeless tobacco: Never Used  . Alcohol use 1.2 oz/week    2 Cans of beer per week     Comment: occasionally   Allergies   Morphine  Review of Systems Review of Systems  Constitutional: Negative for chills and fever.  Respiratory: Negative for shortness of breath.   Cardiovascular: Negative for chest pain.  Gastrointestinal: Negative for abdominal pain, nausea and vomiting.  Musculoskeletal: Negative for back pain and neck pain.  Skin: Positive for wound. Negative for rash.  Neurological: Positive for seizures. Negative for tremors, weakness, light-headedness, numbness and headaches.    Psychiatric/Behavioral: Negative for confusion and hallucinations.  All other systems reviewed and are negative.    Physical Exam Updated Vital Signs BP 105/75 (BP Location: Right Arm)   Pulse 91   Temp 98.3 F (36.8 C) (Oral)   Resp 18   Wt 164 lb (74.4 kg)   SpO2 99%   BMI 30.99 kg/m   Physical Exam  Constitutional: She is oriented to person, place, and time. She appears well-developed and well-nourished. No distress.  HENT:  Head: Normocephalic.  Mouth/Throat: Oropharynx is clear and moist.  She has a contusion noted to the mucosal surface of her lower lip. No other intraoral injury.  Eyes: EOM are normal. Pupils are equal, round, and reactive to light.  Neck: Normal range of motion. Neck supple.  No meningismus. No posterior midline cervical tenderness to palpation.  Cardiovascular: Normal rate and regular rhythm.  Exam reveals no gallop and no friction rub.   No murmur heard. Pulmonary/Chest: Effort normal and breath sounds normal. No respiratory distress. She has no wheezes. She has no rales. She exhibits no tenderness.  Abdominal: Soft. Bowel sounds are normal. There is no tenderness. There is no rebound and no guarding.  Musculoskeletal: Normal range of motion. She exhibits no edema or tenderness.  No midline thoracic or lumbar tenderness. No lower extremity swelling, asymmetry or tenderness. Distal pulses are 2+.  Neurological: She is alert and oriented to person, place, and time.  5/5 motor in all extremities. Sensation fully intact.  Skin: Skin is warm and dry. Capillary refill takes less than 2 seconds. No rash noted. No erythema.  Psychiatric: She has a normal mood and affect. Her behavior is normal.  Nursing note and vitals reviewed.   ED Treatments / Results  DIAGNOSTIC STUDIES:  Oxygen Saturation is 99% on RA, normal by my interpretation.    COORDINATION OF CARE:  8:55 AM Discussed treatment plan with pt at bedside and pt agreed to plan.  Labs (all  labs ordered are listed, but only abnormal results are displayed) Labs Reviewed  COMPREHENSIVE METABOLIC PANEL - Abnormal; Notable for the following:       Result Value   Calcium 8.6 (*)    Total Protein 6.3 (*)    Albumin 3.3 (*)    ALT 12 (*)    All other components within normal limits  CBC WITH DIFFERENTIAL/PLATELET  ETHANOL    EKG  EKG Interpretation None       Radiology No results found.  Procedures Procedures (including critical care time)  Medications Ordered in ED Medications - No data to display   Initial Impression / Assessment and Plan / ED Course  I have reviewed the triage vital signs and the nursing notes.  Pertinent labs & imaging results that were available during my care of the patient were reviewed by me and  considered in my medical decision making (see chart for details).     I personally performed the services described in this documentation, which was scribed in my presence. The recorded information has been reviewed and is accurate.   Patient is at her baseline mental status. Advised follow-up with her neurologist. Return precautions have been given.  Final Clinical Impressions(s) / ED Diagnoses   Final diagnoses:  Seizure [facility])    New Prescriptions Discharge Medication List as of 11/18/2016 10:41 AM       Elizabeth Racer, MD 11/18/16 [phone]

## 2016-11-18 NOTE — ED Triage Notes (Addendum)
Pt brought in via EMS. Per son heard noise, pt was in bed having a seizure that lasted approximately 2 minutes. EMS states pt was post ictal upon arrival. Pt is alert, but still slow to respond at this time. Reports las seizure was last Monday. Pt was incontinent of urine

## 2016-11-27 ENCOUNTER — Emergency Department (HOSPITAL_COMMUNITY)
Admission: EM | Admit: 2016-11-27 | Discharge: 2016-11-27 | Disposition: A | Discharge status: Home / Self Care | Payer: Medicare Other | Attending: Emergency Medicine | Admitting: Emergency Medicine

## 2016-11-27 ENCOUNTER — Encounter (HOSPITAL_COMMUNITY): Payer: Self-pay | Admitting: Emergency Medicine

## 2016-11-27 DIAGNOSIS — R569 Unspecified convulsions: Secondary | ICD-10-CM | POA: Diagnosis present

## 2016-11-27 DIAGNOSIS — R061 Stridor: Secondary | ICD-10-CM | POA: Diagnosis not present

## 2016-11-27 DIAGNOSIS — F4489 Other dissociative and conversion disorders: Secondary | ICD-10-CM | POA: Diagnosis not present

## 2016-11-27 DIAGNOSIS — Z79899 Other long term (current) drug therapy: Secondary | ICD-10-CM | POA: Insufficient documentation

## 2016-11-27 DIAGNOSIS — F17210 Nicotine dependence, cigarettes, uncomplicated: Secondary | ICD-10-CM | POA: Diagnosis not present

## 2016-11-27 DIAGNOSIS — G40909 Epilepsy, unspecified, not intractable, without status epilepticus: Secondary | ICD-10-CM | POA: Diagnosis not present

## 2016-11-27 LAB — CBG MONITORING, ED: Glucose-Capillary: 92 mg/dL (ref 65–99)

## 2016-11-27 NOTE — ED Triage Notes (Signed)
Per EMS, called out in reference to seizure. EMS reports pt post-ictal at time of arrival. CBG en route 127. Pt alert and oriented at time of arrival. nad noted.

## 2016-11-27 NOTE — ED Notes (Signed)
Resting with eyes open.  No distress.  No seizure activity.

## 2016-11-27 NOTE — ED Provider Notes (Signed)
[facility]-EMERGENCY DEPT Provider Note   CSN: [id number] Arrival date & time: 11/27/16  4098   By signing my name below, I, [name], attest that this documentation has been prepared under the direction and in the presence of Loren Racer, MD. Electronically Signed: [name], Scribe. 11/27/16. 8:58 AM. History   Chief Complaint Chief Complaint  Patient presents with  . Seizures    The history is provided by the patient. No language interpreter was used.  Seizures   This is a new problem. The current episode started less than 1 hour ago. Pertinent negatives include no headaches, no visual disturbance, no chest pain, no nausea, no vomiting and no diarrhea. The seizures did not continue in the ED.  HPI Comments: [name] is a 35 y.o. female brought in by ambulance, who presents to the Emergency Department for an evaluation s/p witnessed seizure that occurred earlier today. Per EMS: When they arrived the patient appeared to be postictal. Patient states that she is currently on seizure medication and has not missed a dose. Her most recent seizure was at the end of April. She hasn't seen her neurologist recently. She denies tongue biting or any pain currerntly.  Past Medical History:  Diagnosis Date  . Back pain   . Chronic headaches   . Depression   . Genital warts    herpes  . Herpes   . Seizures (HCC) 03/15/2016    Patient Active Problem List   Diagnosis Date Noted  . Status epilepticus, generalized convulsive (HCC) 11/20/2015  . Seizure (HCC) 11/19/2015  . Amenorrhea 09/28/2011  . Vitamin D deficiency 09/06/2011  . Headache 03/10/2011  . Obesity (BMI 30.0-34.9) 05/28/2009  . Seizures (HCC) 10/26/2008  . NICOTINE ADDICTION 12/10/2007  . Depression 12/10/2007    Past Surgical History:  Procedure Laterality Date  . CESAREAN SECTION     x 3    OB History    Gravida Para Term Preterm AB Living   3 3       3    SAB TAB Ectopic Multiple Live Births           3       Home Medications    Prior to Admission medications   Medication Sig Start Date End Date Taking? Authorizing Provider  famciclovir (FAMVIR) 250 MG tablet Take 1 tablet (250 mg total) by mouth 2 (two) times daily. For itch 11/12/16  Yes Kerri Perches, MD  FLUoxetine (PROZAC) 20 MG capsule Take 1 capsule (20 mg total) by mouth daily. 02/20/16  Yes Kerri Perches, MD  HYDROcodone-acetaminophen (NORCO) 7.5-325 MG tablet Take 1 tablet by mouth daily as needed for moderate pain.  07/24/16  Yes [provider]  medroxyPROGESTERone (DEPO-PROVERA) 150 MG/ML injection INJECT 1 ML IN THE MUSCLE ONCE EVERY 3 MONTHS AS DIRECTED 02/01/16  Yes Cyril Mourning A, NP  methocarbamol (ROBAXIN) 500 MG tablet Take 1 tablet by mouth daily as needed for muscle spasms. 02/27/16  Yes [provider]  Oxcarbazepine (TRILEPTAL) 300 MG tablet Take 600 mg by mouth 2 (two) times daily.  08/16/16  Yes [provider]  FYCOMPA 2 MG TABS Take 1 tablet by mouth at bedtime. 11/20/16   [provider]  lacosamide (VIMPAT) 50 MG TABS tablet Take 1 tablet (50 mg total) by mouth 2 (two) times daily. Patient not taking: Reported on 11/27/2016 07/26/16   [name], MD    Family History Family History  Problem Relation Age of Onset  . Hypertension  Mother     Social History Social History  Substance Use Topics  . Smoking status: Current Every Day Smoker    Packs/day: 1.00    Years: 10.00    Types: Cigarettes  . Smokeless tobacco: Never Used  . Alcohol use 1.2 oz/week    2 Cans of beer per week     Comment: occasionally     Allergies   Morphine   Review of Systems Review of Systems  Constitutional: Negative for fever.  HENT: Negative for facial swelling and mouth sores.   Eyes: Negative for visual disturbance.  Respiratory: Negative for shortness of breath.   Cardiovascular: Negative for chest pain.  Gastrointestinal: Negative for abdominal pain, diarrhea,  nausea and vomiting.  Musculoskeletal: Negative for back pain, myalgias and neck pain.  Skin: Negative for rash and wound.  Neurological: Positive for seizures. Negative for dizziness, weakness, numbness and headaches.  All other systems reviewed and are negative.    Physical Exam Updated Vital Signs BP 117/86   Pulse 68   Temp 98.5 F (36.9 C) (Oral)   Resp 12   Ht 5\' 7"  (1.702 m)   Wt 170 lb (77.1 kg)   SpO2 100%   BMI 26.63 kg/m   Physical Exam  Constitutional: She is oriented to person, place, and time. She appears well-developed and well-nourished. No distress.  HENT:  Head: Normocephalic and atraumatic.  No intraoral trauma  Eyes: Conjunctivae and EOM are normal. Pupils are equal, round, and reactive to light.  Neck: Normal range of motion. Neck supple. No tracheal deviation present.  No posterior midline cervical tenderness to palpation.  Cardiovascular: Normal rate and regular rhythm.  Exam reveals no gallop and no friction rub.   No murmur heard. Pulmonary/Chest: Effort normal and breath sounds normal. No respiratory distress. She has no wheezes. She has no rales. She exhibits no tenderness.  Abdominal: Soft. Bowel sounds are normal. She exhibits no mass. There is no tenderness. There is no rebound and no guarding.  Musculoskeletal: Normal range of motion.  No midline thoracic or lumbar tenderness to palpation. No lower extremity swelling or pain. Distal pulses are 2+.  Neurological: She is alert and oriented to person, place, and time.  5/5 motor in all extremities. Sensation fully intact.  Skin: Skin is warm and dry. Capillary refill takes less than 2 seconds.  Psychiatric: She has a normal mood and affect. Her behavior is normal.  Nursing note and vitals reviewed.    ED Treatments / Results  DIAGNOSTIC STUDIES: Oxygen Saturation is 97% on RA, normal by my interpretation.    COORDINATION OF CARE: 8:49 AM Discussed treatment plan with pt at bedside and pt  agreed to plan. I will check the patient's CT scan. Labs (all labs ordered are listed, but only abnormal results are displayed) Labs Reviewed  CBG MONITORING, ED    EKG  EKG Interpretation None       Radiology No results found.  Procedures Procedures (including critical care time)  Medications Ordered in ED Medications - No data to display   Initial Impression / Assessment and Plan / ED Course  I have reviewed the triage vital signs and the nursing notes.  Pertinent labs & imaging results that were available during my care of the patient were reviewed by me and considered in my medical decision making (see chart for details).   CBG within normal limits. EKG and normal sinus rhythm. Patient was initially mildly drowsy but has returned to baseline mental status.  She has no complaints. Anticipate discharge home to follow-up with her neurologist. Return precautions given.    Final Clinical Impressions(s) / ED Diagnoses   Final diagnoses:  Seizure [facility])    New Prescriptions New Prescriptions   No medications on file   I personally performed the services described in this documentation, which was scribed in my presence. The recorded information has been reviewed and is accurate.      Loren Racer, MD 11/27/16 1109

## 2016-12-19 ENCOUNTER — Encounter: Payer: Self-pay | Admitting: Family Medicine

## 2016-12-19 ENCOUNTER — Ambulatory Visit (INDEPENDENT_AMBULATORY_CARE_PROVIDER_SITE_OTHER): Payer: Medicare Other | Admitting: Family Medicine

## 2016-12-19 VITALS — BP 130/80 | HR 88 | Resp 16 | Ht 67.0 in | Wt 164.4 lb

## 2016-12-19 DIAGNOSIS — R569 Unspecified convulsions: Secondary | ICD-10-CM | POA: Diagnosis not present

## 2016-12-19 DIAGNOSIS — G4489 Other headache syndrome: Secondary | ICD-10-CM

## 2016-12-19 DIAGNOSIS — F17210 Nicotine dependence, cigarettes, uncomplicated: Secondary | ICD-10-CM

## 2016-12-19 DIAGNOSIS — F17200 Nicotine dependence, unspecified, uncomplicated: Secondary | ICD-10-CM

## 2016-12-19 DIAGNOSIS — F329 Major depressive disorder, single episode, unspecified: Secondary | ICD-10-CM

## 2016-12-19 DIAGNOSIS — F32A Depression, unspecified: Secondary | ICD-10-CM

## 2016-12-19 DIAGNOSIS — B379 Candidiasis, unspecified: Secondary | ICD-10-CM | POA: Diagnosis not present

## 2016-12-19 MED ORDER — NYSTATIN 100000 UNIT/GM EX POWD: Freq: Two times a day (BID) | CUTANEOUS | 0 refills | Status: DC

## 2016-12-19 NOTE — Patient Instructions (Addendum)
F/u in 2 months, call if you need me before  Plan is to stop smoking in 2 months  Call 1800 QUIT NOW for help and [place] health dept for next class for women, nocorette gum and gas vouchers  Are available   No smoking with patch, hope quit date July 1, start 10 per day June 1  It is important that you exercise regularly at least 30 minutes 5 times a week. If you develop chest pain, have severe difficulty breathing, or feel very tired, stop exercising immediately and seek medical attention     Thank you  for choosing [facility]. We consider it a privelige to serve you.  Delivering excellent health care in a caring and  compassionate way is our goal.  Partnering with you,  so that together we can achieve this goal is our strategy.    Steps to Quit Smoking Smoking tobacco can be bad for your health. It can also affect almost every organ in your body. Smoking puts you and people around you at risk for many serious long-lasting (chronic) diseases. Quitting smoking is hard, but it is one of the best things that you can do for your health. It is never too late to quit. What are the benefits of quitting smoking? When you quit smoking, you lower your risk for getting serious diseases and conditions. They can include:  Lung cancer or lung disease.  Heart disease.  Stroke.  Heart attack.  Not being able to have children (infertility).  Weak bones (osteoporosis) and broken bones (fractures).  If you have coughing, wheezing, and shortness of breath, those symptoms may get better when you quit. You may also get sick less often. If you are pregnant, quitting smoking can help to lower your chances of having a baby of low birth weight. What can I do to help me quit smoking? Talk with your doctor about what can help you quit smoking. Some things you can do (strategies) include:  Quitting smoking totally, instead of slowly cutting back how much you smoke over a period of  time.  Going to in-person counseling. You are more likely to quit if you go to many counseling sessions.  Using resources and support systems, such as: ? Agricultural engineer with a Veterinary surgeon. ? Phone quitlines. ? Automotive engineer. ? Support groups or group counseling. ? Text messaging programs. ? Mobile phone apps or applications.  Taking medicines. Some of these medicines may have nicotine in them. If you are pregnant or breastfeeding, do not take any medicines to quit smoking unless your doctor says it is okay. Talk with your doctor about counseling or other things that can help you.  Talk with your doctor about using more than one strategy at the same time, such as taking medicines while you are also going to in-person counseling. This can help make quitting easier. What things can I do to make it easier to quit? Quitting smoking might feel very hard at first, but there is a lot that you can do to make it easier. Take these steps:  Talk to your family and friends. Ask them to support and encourage you.  Call phone quitlines, reach out to support groups, or work with a Veterinary surgeon.  Ask people who smoke to not smoke around you.  Avoid places that make you want (trigger) to smoke, such as: ? Bars. ? Parties. ? Smoke-break areas at work.  Spend time with people who do not smoke.  Lower the stress in  your life. Stress can make you want to smoke. Try these things to help your stress: ? Getting regular exercise. ? Deep-breathing exercises. ? Yoga. ? Meditating. ? Doing a body scan. To do this, close your eyes, focus on one area of your body at a time from head to toe, and notice which parts of your body are tense. Try to relax the muscles in those areas.  Download or buy apps on your mobile phone or tablet that can help you stick to your quit plan. There are many free apps, such as QuitGuide from the [employer] developer(Centers for Disease Control and Prevention). You can find more support from  smokefree.gov and other websites.  This information is not intended to replace advice given to you by your health care provider. Make sure you discuss any questions you have with your health care provider. Document Released: 05/04/2009 Document Revised: 03/05/2016 Document Reviewed: 11/22/2014 Elsevier Interactive Patient Education  2018 [employer]

## 2016-12-22 ENCOUNTER — Encounter: Payer: Self-pay | Admitting: Family Medicine

## 2016-12-22 DIAGNOSIS — B379 Candidiasis, unspecified: Secondary | ICD-10-CM | POA: Insufficient documentation

## 2016-12-22 NOTE — Assessment & Plan Note (Signed)

## 2016-12-22 NOTE — Assessment & Plan Note (Addendum)
Nystatin powder prescribed for as needed use 

## 2016-12-22 NOTE — Assessment & Plan Note (Signed)
Controlled, no change in medication  

## 2016-12-22 NOTE — Assessment & Plan Note (Signed)
Managed by neurologist and controlled reportedly

## 2016-12-22 NOTE — Progress Notes (Signed)
   [name]     MRN: [id number]      DOB: [birth year]   HPI [name] is here for follow up and re-evaluation of chronic medical conditions, medication management and review of any available recent lab and radiology data.  Preventive health is updated, specifically  Cancer screening and Immunization.   [facility] for uncontrolled seizure disorder since then states recently had medication switched by her current neurologist and she would like this changed back. C/o itchy rash in groin best response is to topical nystatin powder , and requests refill States she smokes out of boredom, needs to find a job, had an episode where she could hardly breathe recently and is motivated to quitting though unable to definitively set a quit date ROS Denies recent fever or chills. Denies sinus pressure, nasal congestion, ear pain or sore throat. Denies chest congestion, productive cough or wheezing. Denies chest pains, palpitations and leg swelling Denies abdominal pain, nausea, vomiting,diarrhea or constipation.   Denies dysuria, frequency, hesitancy or incontinence. Denies joint pain, swelling and limitation in mobility. Denies headaches, seizures, numbness, or tingling. Denies uncontroled  depression, anxiety or insomnia.  PE  BP 130/80   Pulse 88   Resp 16   Ht 5\' 7"  (1.702 m)   Wt 164 lb 6.4 oz (74.6 kg)   SpO2 98%   BMI 25.75 kg/m   Patient alert and oriented and in no cardiopulmonary distress.  HEENT: No facial asymmetry, EOMI,   oropharynx pink and moist.  Neck supple no JVD, no mass.  Chest: Clear to auscultation bilaterally.Decreased air entry  CVS: S1, S2 no murmurs, no S3.Regular rate.  ABD: Soft non tender.   Ext: No edema  MS: Adequate ROM spine, shoulders, hips and knees.  Skin: Intact, no ulcerations or rash noted.  Psych: Good eye contact, normal affect. Memory intact not anxious or depressed appearing.  CNS: CN 2-12 intact, power,   normal throughout.no focal deficits noted.   Assessment & Plan  Depression Controlled, no change in medication   NICOTINE ADDICTION Patient is asked and  confirms current  Nicotine use.  Five to seven minutes of time is spent in counseling the patient of the need to quit smoking  Advice to quit is delivered clearly specifically in reducing the risk of developing heart disease, having a stroke, or of developing all types of cancer, especially lung and oral cancer. Improvement in breathing and exercise tolerance and quality of life is also discussed, as is the economic benefit.  Assessment of willingness to quit or to make an attempt to quit is made and documented  Assistance in quit attempt is made with several and varied options presented, based on patient's desire and need. These include  literature, local classes available, 1800 QUIT NOW number, OTC and prescription medication.  The GOAL to be NICOTINE FREE is re emphasized.  The patient has set a personal goal of either reduction or discontinuation and follow up is arranged between 6 an 16 weeks.    Headache Managed by neurologist and controlled reportedly  Seizures (HCC) Managed by neurology, often has breakthrough seizure activity unfortunately  Candidiasis Nystatin powder prescribed for as needed use

## 2016-12-22 NOTE — Assessment & Plan Note (Signed)
Managed by neurology, often has breakthrough seizure activity unfortunately

## 2017-01-13 DIAGNOSIS — Z79899 Other long term (current) drug therapy: Secondary | ICD-10-CM | POA: Diagnosis not present

## 2017-01-13 DIAGNOSIS — G40919 Epilepsy, unspecified, intractable, without status epilepticus: Secondary | ICD-10-CM | POA: Diagnosis not present

## 2017-01-13 DIAGNOSIS — F17200 Nicotine dependence, unspecified, uncomplicated: Secondary | ICD-10-CM | POA: Diagnosis not present

## 2017-01-13 DIAGNOSIS — G4459 Other complicated headache syndrome: Secondary | ICD-10-CM | POA: Diagnosis not present

## 2017-02-03 ENCOUNTER — Other Ambulatory Visit: Payer: Self-pay | Admitting: Adult Health

## 2017-02-03 ENCOUNTER — Ambulatory Visit: Payer: Medicare Other

## 2017-02-17 ENCOUNTER — Telehealth: Payer: Self-pay | Admitting: *Deleted

## 2017-02-17 NOTE — Telephone Encounter (Signed)
LMOVM that prescription was sent on 7/16 to [facility].

## 2017-02-18 ENCOUNTER — Other Ambulatory Visit: Payer: Medicare Other

## 2017-02-18 ENCOUNTER — Ambulatory Visit (INDEPENDENT_AMBULATORY_CARE_PROVIDER_SITE_OTHER): Payer: Medicare Other

## 2017-02-18 ENCOUNTER — Other Ambulatory Visit: Payer: Self-pay | Admitting: Obstetrics and Gynecology

## 2017-02-18 ENCOUNTER — Ambulatory Visit: Payer: Medicare Other | Admitting: Family Medicine

## 2017-02-18 VITALS — Wt 167.0 lb

## 2017-02-18 DIAGNOSIS — Z3042 Encounter for surveillance of injectable contraceptive: Secondary | ICD-10-CM

## 2017-02-18 DIAGNOSIS — O0281 Inappropriate change in quantitative human chorionic gonadotropin (hCG) in early pregnancy: Secondary | ICD-10-CM | POA: Diagnosis not present

## 2017-02-18 LAB — BETA HCG QUANT (REF LAB)

## 2017-02-18 MED ORDER — MEDROXYPROGESTERONE ACETATE 150 MG/ML IM SUSP
150.0000 mg | Freq: Once | INTRAMUSCULAR | Status: AC
  Administered 2017-02-18: 150 mg via INTRAMUSCULAR

## 2017-02-18 NOTE — Progress Notes (Signed)
PT here for depo shot 150 mg IM, given RT Deltoid. Tolerated week. Return 12 week for next shot. Serum HCG Negative. Pad CMA

## 2017-02-18 NOTE — Addendum Note (Signed)
Addended by: Federico FlakeNES, PEGGY A on: 02/18/2017 04:06 PM   Modules accepted: Level of Service

## 2017-03-03 ENCOUNTER — Encounter: Payer: Self-pay | Admitting: Family Medicine

## 2017-03-03 ENCOUNTER — Ambulatory Visit: Payer: Medicare Other | Admitting: Family Medicine

## 2017-03-12 DIAGNOSIS — G4459 Other complicated headache syndrome: Secondary | ICD-10-CM | POA: Diagnosis not present

## 2017-03-12 DIAGNOSIS — Z79899 Other long term (current) drug therapy: Secondary | ICD-10-CM | POA: Diagnosis not present

## 2017-03-12 DIAGNOSIS — G43909 Migraine, unspecified, not intractable, without status migrainosus: Secondary | ICD-10-CM | POA: Diagnosis not present

## 2017-03-12 DIAGNOSIS — G40919 Epilepsy, unspecified, intractable, without status epilepticus: Secondary | ICD-10-CM | POA: Diagnosis not present

## 2017-03-12 DIAGNOSIS — F17200 Nicotine dependence, unspecified, uncomplicated: Secondary | ICD-10-CM | POA: Diagnosis not present

## 2017-03-17 DIAGNOSIS — G40419 Other generalized epilepsy and epileptic syndromes, intractable, without status epilepticus: Secondary | ICD-10-CM | POA: Diagnosis not present

## 2017-03-17 DIAGNOSIS — Z6825 Body mass index (BMI) 25.0-25.9, adult: Secondary | ICD-10-CM | POA: Diagnosis not present

## 2017-04-21 ENCOUNTER — Encounter (HOSPITAL_COMMUNITY): Payer: Self-pay | Admitting: Emergency Medicine

## 2017-04-21 ENCOUNTER — Emergency Department (HOSPITAL_COMMUNITY)
Admission: EM | Admit: 2017-04-21 | Discharge: 2017-04-21 | Disposition: A | Discharge status: Home / Self Care | Payer: Medicare Other | Attending: Emergency Medicine | Admitting: Emergency Medicine

## 2017-04-21 DIAGNOSIS — Z79899 Other long term (current) drug therapy: Secondary | ICD-10-CM | POA: Diagnosis not present

## 2017-04-21 DIAGNOSIS — G40909 Epilepsy, unspecified, not intractable, without status epilepticus: Secondary | ICD-10-CM | POA: Diagnosis not present

## 2017-04-21 DIAGNOSIS — R569 Unspecified convulsions: Secondary | ICD-10-CM

## 2017-04-21 DIAGNOSIS — F17210 Nicotine dependence, cigarettes, uncomplicated: Secondary | ICD-10-CM | POA: Diagnosis not present

## 2017-04-21 LAB — CBC WITH DIFFERENTIAL/PLATELET
Basophils Absolute: 0 10*3/uL (ref 0.0–0.1)
Basophils Relative: 0 %
EOS ABS: 0.1 10*3/uL (ref 0.0–0.7)
Eosinophils Relative: 2 %
HCT: 40 % (ref 36.0–46.0)
HEMOGLOBIN: 13.6 g/dL (ref 12.0–15.0)
LYMPHS ABS: 1.5 10*3/uL (ref 0.7–4.0)
Lymphocytes Relative: 42 %
MCH: 32.2 pg (ref 26.0–34.0)
MCHC: 34 g/dL (ref 30.0–36.0)
MCV: 94.6 fL (ref 78.0–100.0)
Monocytes Absolute: 0.4 10*3/uL (ref 0.1–1.0)
Monocytes Relative: 10 %
NEUTROS PCT: 46 %
Neutro Abs: 1.7 10*3/uL (ref 1.7–7.7)
Platelets: 410 10*3/uL — ABNORMAL HIGH (ref 150–400)
RBC: 4.23 MIL/uL (ref 3.87–5.11)
RDW: 14.1 % (ref 11.5–15.5)
WBC: 3.6 10*3/uL — AB (ref 4.0–10.5)

## 2017-04-21 LAB — COMPREHENSIVE METABOLIC PANEL
ALBUMIN: 3.5 g/dL (ref 3.5–5.0)
ALK PHOS: 86 U/L (ref 38–126)
ALT: 17 U/L (ref 14–54)
AST: 12 U/L — AB (ref 15–41)
Anion gap: 8 (ref 5–15)
CALCIUM: 8.8 mg/dL — AB (ref 8.9–10.3)
CO2: 24 mmol/L (ref 22–32)
CREATININE: 0.65 mg/dL (ref 0.44–1.00)
Chloride: 99 mmol/L — ABNORMAL LOW (ref 101–111)
GFR calc non Af Amer: >60 mL/min (ref >60)
GLUCOSE: 93 mg/dL (ref 65–99)
Potassium: 4 mmol/L (ref 3.5–5.1)
SODIUM: 131 mmol/L — AB (ref 135–145)
Total Bilirubin: 0.3 mg/dL (ref 0.3–1.2)
Total Protein: 7 g/dL (ref 6.5–8.1)

## 2017-04-21 LAB — CBG MONITORING, ED: Glucose-Capillary: 92 mg/dL (ref 65–99)

## 2017-04-21 MED ORDER — LORAZEPAM 2 MG/ML IJ SOLN
1.0000 mg | Freq: Once | INTRAMUSCULAR | Status: AC
  Administered 2017-04-21: 1 mg via INTRAVENOUS
  Filled 2017-04-21: qty 1

## 2017-04-21 NOTE — Discharge Instructions (Signed)
No change in your baseline medications. Continue as currently prescribed.

## 2017-04-21 NOTE — ED Provider Notes (Signed)
[facility]Y DEPT Provider Note   CSN: [id number] Arrival date & time: 04/21/17  1153     History   Chief Complaint Chief Complaint  Patient presents with  . Seizures    HPI [name] is a 36 y.o. female. Complaint is seizure.  HPI: 36 year old female. History of a rather refractory seizure disorder. On multiple medications. Is compliant per her report. Family confirms this. She states she waking this morning in her bed" there was urine in my bed in on me". Daughter states that she had a seizure. She has no symptoms now. She arrives fully awake. Was initially confused per family. Did not bite her tongue. No head injury. No shoulder pain. No recent change in medications or dosages. No sleep deprivation. No alcohol or drug use. No fevers nausea vomiting or other intercurrent illness.  Past Medical History:  Diagnosis Date  . Back pain   . Chronic headaches   . Depression   . Genital warts    herpes  . Herpes   . Seizures (HCC) 03/15/2016    Patient Active Problem List   Diagnosis Date Noted  . Candidiasis 12/22/2016  . Status epilepticus, generalized convulsive (HCC) 11/20/2015  . Seizure (HCC) 11/19/2015  . Amenorrhea 09/28/2011  . Vitamin D deficiency 09/06/2011  . Headache 03/10/2011  . Seizures (HCC) 10/26/2008  . NICOTINE ADDICTION 12/10/2007  . Depression 12/10/2007    Past Surgical History:  Procedure Laterality Date  . CESAREAN SECTION     x 3    OB History    Gravida Para Term Preterm AB Living   SAB TAB Ectopic Multiple Live Births           3       Home Medications    Prior to Admission medications   Medication Sig Start Date End Date Taking? Authorizing Provider  famciclovir (FAMVIR) 250 MG tablet Take 1 tablet (250 mg total) by mouth 2 (two) times daily. For itch 11/12/16   Kerri Perches, MD  FLUoxetine (PROZAC) 20 MG capsule Take 1 capsule (20 mg total) by mouth daily. 02/20/16   Kerri Perches, MD  FYCOMPA 2  MG TABS Take 1 tablet by mouth at bedtime. 11/20/16   [provider]  lacosamide (VIMPAT) 50 MG TABS tablet Take 1 tablet (50 mg total) by mouth 2 (two) times daily. 07/26/16   [name], MD  medroxyPROGESTERone (DEPO-PROVERA) 150 MG/ML injection INJECT 1 ML IN THE MUSCLE ONCE EVERY 3 MONTHS AS DIRECTED 02/03/17   Cyril Mourning A, NP  nystatin (NYSTATIN) powder Apply topically 2 (two) times daily. 12/19/16   Kerri Perches, MD  Oxcarbazepine (TRILEPTAL) 300 MG tablet Take 600 mg by mouth 2 (two) times daily.  08/16/16   [provider]    Family History Family History  Problem Relation Age of Onset  . Hypertension Mother     Social History Social History  Substance Use Topics  . Smoking status: Current Every Day Smoker    Packs/day: 1.00    Years: 10.00    Types: Cigarettes  . Smokeless tobacco: Never Used  . Alcohol use 1.2 oz/week    2 Cans of beer per week     Comment: occasionally     Allergies   Morphine   Review of Systems Review of Systems  Constitutional: Negative for appetite change, chills, diaphoresis, fatigue and fever.  HENT: Negative for mouth sores, sore throat and  trouble swallowing.   Eyes: Negative for visual disturbance.  Respiratory: Negative for cough, chest tightness, shortness of breath and wheezing.   Cardiovascular: Negative for chest pain.  Gastrointestinal: Negative for abdominal distention, abdominal pain, diarrhea, nausea and vomiting.  Endocrine: Negative for polydipsia, polyphagia and polyuria.  Genitourinary: Negative for dysuria, frequency and hematuria.  Musculoskeletal: Negative for gait problem.  Skin: Negative for color change, pallor and rash.  Neurological: Positive for seizures. Negative for dizziness, syncope, light-headedness and headaches.  Hematological: Does not bruise/bleed easily.  Psychiatric/Behavioral: Negative for behavioral problems and confusion.     Physical Exam Updated Vital Signs BP  116/84   Pulse 82   Temp 98.7 F (37.1 C) (Oral)   Resp 16   Ht  (1.549 m)   Wt 68 kg (150 lb)   SpO2 100%   BMI 28.34 kg/m   Physical Exam  Constitutional: She is oriented to person, place, and time. She appears well-developed and well-nourished. No distress.  HENT:  Head: Normocephalic.  Normal tone. No contusion.  Eyes: Pupils are equal, round, and reactive to light. Conjunctivae are normal. No scleral icterus.  Neck: Normal range of motion. Neck supple. No thyromegaly present.  Cardiovascular: Normal rate and regular rhythm.  Exam reveals no gallop and no friction rub.   No murmur heard. Pulmonary/Chest: Effort normal and breath sounds normal. No respiratory distress. She has no wheezes. She has no rales.  Abdominal: Soft. Bowel sounds are normal. She exhibits no distension. There is no tenderness. There is no rebound.  Musculoskeletal: Normal range of motion.  Neurological: She is alert and oriented to person, place, and time.  Awake and alert. No distress. Conversant.  Skin: Skin is warm and dry. No rash noted.  Psychiatric: She has a normal mood and affect. Her behavior is normal.     ED Treatments / Results  Labs (all labs ordered are listed, but only abnormal results are displayed) Labs Reviewed  CBC WITH DIFFERENTIAL/PLATELET - Abnormal; Notable for the following:       Result Value   WBC 3.6 (*)    Platelets 410 (*)    All other components within normal limits  COMPREHENSIVE METABOLIC PANEL - Abnormal; Notable for the following:    Sodium 131 (*)    Chloride 99 (*)    BUN <5 (*)    Calcium 8.8 (*)    AST 12 (*)    All other components within normal limits  CBG MONITORING, ED    EKG  EKG Interpretation None       Radiology No results found.  Procedures Procedures (including critical care time)  Medications Ordered in ED Medications  LORazepam (ATIVAN) injection 1 mg (1 mg Intravenous Given 04/21/17 1227)     Initial Impression /  Assessment and Plan / ED Course  I have reviewed the triage vital signs and the nursing notes.  Pertinent labs & imaging results that were available during my care of the patient were reviewed by me and considered in my medical decision making (see chart for details).   breakthrough seizure without obvious etiology. May be simple breakthrough. Plan Cipro lab evaluation as she is on multiple medications. One dose of Ativan here. May be appropriate for  discharge if she remains asymptomatic no additional activity with normal labs.    Final Clinical Impressions(s) / ED Diagnoses   Final diagnoses:  Seizure (HCC)    Reassuring labs. No obvious etiology for current breakthrough seizure. No indication for change in  therapy based on liver and attic function. Continue current meds. Primary care follow-up as scheduled. ER with worsening or recurrence  New Prescriptions New Prescriptions   No medications on file     Rolland Porter, MD 04/21/17 1358

## 2017-04-21 NOTE — ED Triage Notes (Signed)
EMS reports pt's daughter found her having a seizure in bed this morning.  Pt postictal on arrival.  Hx of seizures.

## 2017-04-26 ENCOUNTER — Emergency Department (HOSPITAL_COMMUNITY)
Admission: EM | Admit: 2017-04-26 | Discharge: 2017-04-26 | Disposition: A | Discharge status: Home Health | Payer: Medicare Other | Attending: Emergency Medicine | Admitting: Emergency Medicine

## 2017-04-26 ENCOUNTER — Encounter (HOSPITAL_COMMUNITY): Payer: Self-pay | Admitting: Emergency Medicine

## 2017-04-26 DIAGNOSIS — R569 Unspecified convulsions: Secondary | ICD-10-CM

## 2017-04-26 DIAGNOSIS — F17210 Nicotine dependence, cigarettes, uncomplicated: Secondary | ICD-10-CM | POA: Diagnosis not present

## 2017-04-26 DIAGNOSIS — R4182 Altered mental status, unspecified: Secondary | ICD-10-CM | POA: Diagnosis not present

## 2017-04-26 DIAGNOSIS — Z79899 Other long term (current) drug therapy: Secondary | ICD-10-CM | POA: Insufficient documentation

## 2017-04-26 LAB — I-STAT CHEM 8, ED
BUN: <3 mg/dL — ABNORMAL LOW (ref 6–20)
CALCIUM ION: 1.05 mmol/L — AB (ref 1.15–1.40)
CHLORIDE: 97 mmol/L — AB (ref 101–111)
Creatinine, Ser: 0.5 mg/dL (ref 0.44–1.00)
GLUCOSE: 87 mg/dL (ref 65–99)
HCT: 40 % (ref 36.0–46.0)
Hemoglobin: 13.6 g/dL (ref 12.0–15.0)
Potassium: 3.9 mmol/L (ref 3.5–5.1)
Sodium: 131 mmol/L — ABNORMAL LOW (ref 135–145)
TCO2: 25 mmol/L (ref 22–32)

## 2017-04-26 NOTE — ED Triage Notes (Addendum)
Pt brought in EMS after pt had seizure for approximate"several minutes". Pt seen for same last few weeks. Pt alert and mildly drowsy. [name] airway patent. cbg 84 en route. No incontinence noted at time of EMS arrival.  Seizures pads placed on ED stretcher.  Pt stating during triage "i want to leave." EDP aware and at bedside assessing pt.

## 2017-04-26 NOTE — ED Provider Notes (Signed)
[facility]-EMERG[facility]Y DEPT Provider Note   CSN: [id number] Arrival date & time: 04/26/17  1617     History   Chief Complaint Chief Complaint  Patient presents with  . Seizures    HPI [name] is a 36 y.o. female.  Patient had a seizure today. Patient has a history of seizures and had one last week.   The history is provided by the patient. No language interpreter was used.  Seizures   This is a new problem. The current episode started 12 to 24 hours ago. The problem has been resolved. There was 1 seizure. The most recent episode lasted less than 30 seconds. Pertinent negatives include no sleepiness, no headaches, no chest pain, no cough and no diarrhea. Characteristics do not include eye blinking. The seizure(s) had no focality. Possible causes do not include medication or dosage change. There has been no fever. The fever has been present for less than 1 day.    Past Medical History:  Diagnosis Date  . Back pain   . Chronic headaches   . Depression   . Genital warts    herpes  . Herpes   . Seizures (HCC) 03/15/2016    Patient Active Problem List   Diagnosis Date Noted  . Candidiasis 12/22/2016  . Status epilepticus, generalized convulsive (HCC) 11/20/2015  . Seizure (HCC) 11/19/2015  . Amenorrhea 09/28/2011  . Vitamin D deficiency 09/06/2011  . Headache 03/10/2011  . Seizures (HCC) 10/26/2008  . NICOTINE ADDICTION 12/10/2007  . Depression 12/10/2007    Past Surgical History:  Procedure Laterality Date  . CESAREAN SECTION     x 3    OB History    Gravida Para Term Preterm AB Living   SAB TAB Ectopic Multiple Live Births           3       Home Medications    Prior to Admission medications   Medication Sig Start Date End Date Taking? Authorizing Provider  famciclovir (FAMVIR) 250 MG tablet Take 1 tablet (250 mg total) by mouth 2 (two) times daily. For itch 11/12/16   Kerri Perches, MD  FLUoxetine (PROZAC) 20 MG capsule Take 1  capsule (20 mg total) by mouth daily. 02/20/16   Kerri Perches, MD  FYCOMPA 2 MG TABS Take 1 tablet by mouth at bedtime. 11/20/16   [provider]  lacosamide (VIMPAT) 50 MG TABS tablet Take 1 tablet (50 mg total) by mouth 2 (two) times daily. 07/26/16   [name], MD  medroxyPROGESTERone (DEPO-PROVERA) 150 MG/ML injection INJECT 1 ML IN THE MUSCLE ONCE EVERY 3 MONTHS AS DIRECTED 02/03/17   Cyril Mourning A, NP  nystatin (NYSTATIN) powder Apply topically 2 (two) times daily. 12/19/16   Kerri Perches, MD  Oxcarbazepine (TRILEPTAL) 300 MG tablet Take 600 mg by mouth 2 (two) times daily.  08/16/16   [provider]    Family History Family History  Problem Relation Age of Onset  . Hypertension Mother     Social History Social History  Substance Use Topics  . Smoking status: Current Every Day Smoker    Packs/day: 1.00    Years: 10.00    Types: Cigarettes  . Smokeless tobacco: Never Used  . Alcohol use 1.2 oz/week    2 Cans of beer per week     Comment: occasionally     Allergies   Morphine   Review of Systems Review of  Systems  Constitutional: Negative for appetite change and fatigue.  HENT: Negative for congestion, ear discharge and sinus pressure.   Eyes: Negative for discharge.  Respiratory: Negative for cough.   Cardiovascular: Negative for chest pain.  Gastrointestinal: Negative for abdominal pain and diarrhea.  Genitourinary: Negative for frequency and hematuria.  Musculoskeletal: Negative for back pain.  Skin: Negative for rash.  Neurological: Positive for seizures. Negative for headaches.  Psychiatric/Behavioral: Negative for hallucinations.     Physical Exam Updated Vital Signs BP 118/87 (BP Location: Left Arm)   Pulse 79   Temp 98.5 F (36.9 C) (Oral)   Resp 18   Ht  (1.549 m)   Wt 68 kg (150 lb)   SpO2 100%   BMI 28.34 kg/m   Physical Exam  Constitutional: She is oriented to person, place, and time. She appears  well-developed.  HENT:  Head: Normocephalic.  Eyes: Conjunctivae and EOM are normal. No scleral icterus.  Neck: Neck supple. No thyromegaly present.  Cardiovascular: Normal rate and regular rhythm.  Exam reveals no gallop and no friction rub.   No murmur heard. Pulmonary/Chest: No stridor. She has no wheezes. She has no rales. She exhibits no tenderness.  Abdominal: She exhibits no distension. There is no tenderness. There is no rebound.  Musculoskeletal: Normal range of motion. She exhibits no edema.  Lymphadenopathy:    She has no cervical adenopathy.  Neurological: She is oriented to person, place, and time. She exhibits normal muscle tone. Coordination normal.  Skin: No rash noted. No erythema.  Psychiatric: She has a normal mood and affect. Her behavior is normal.     ED Treatments / Results  Labs (all labs ordered are listed, but only abnormal results are displayed) Labs Reviewed  I-STAT CHEM 8, ED - Abnormal; Notable for the following:       Result Value   Sodium 131 (*)    Chloride 97 (*)    BUN <3 (*)    Calcium, Ion 1.05 (*)    All other components within normal limits    EKG  EKG Interpretation None       Radiology No results found.  Procedures Procedures (including critical care time)  Medications Ordered in ED Medications - No data to display   Initial Impression / Assessment and Plan / ED Course  I have reviewed the triage vital signs and the nursing notes.  Pertinent labs & imaging results that were available during my care of the patient were reviewed by me and considered in my medical decision making (see chart for details).     Patient had a seizure today. Initially she wanted to leave AMA but I convinced her to at least be evaluated for a short period time. Chemistries were normal patient was oriented to person place time and situation. She did not want to be evaluated a longer the emergency department so she was discharged home and instructed  to follow-up with her neurologist this week  Final Clinical Impressions(s) / ED Diagnoses   Final diagnoses:  Seizure [facility])    New Prescriptions New Prescriptions   No medications on file     Bethann Berkshire, MD 04/26/17 1713

## 2017-04-26 NOTE — Discharge Instructions (Signed)
Contact dr. Gerilyn Pilgrim this week and let him know about the two recent seizures

## 2017-05-13 ENCOUNTER — Ambulatory Visit: Payer: Medicare Other

## 2017-05-13 ENCOUNTER — Encounter: Payer: Self-pay | Admitting: *Deleted

## 2017-05-13 ENCOUNTER — Ambulatory Visit (INDEPENDENT_AMBULATORY_CARE_PROVIDER_SITE_OTHER): Payer: Medicare Other | Admitting: *Deleted

## 2017-05-13 DIAGNOSIS — Z3202 Encounter for pregnancy test, result negative: Secondary | ICD-10-CM | POA: Diagnosis not present

## 2017-05-13 DIAGNOSIS — Z3042 Encounter for surveillance of injectable contraceptive: Secondary | ICD-10-CM | POA: Diagnosis not present

## 2017-05-13 LAB — POCT URINE PREGNANCY: PREG TEST UR: NEGATIVE

## 2017-05-13 MED ORDER — MEDROXYPROGESTERONE ACETATE 150 MG/ML IM SUSP
150.0000 mg | Freq: Once | INTRAMUSCULAR | Status: AC
  Administered 2017-05-13: 150 mg via INTRAMUSCULAR

## 2017-05-13 NOTE — Progress Notes (Signed)
Pt given DepoProvera 150mg IM left deltoid without complications. Advised pt to return in 12 weeks for next injection.  

## 2017-06-04 DIAGNOSIS — G4459 Other complicated headache syndrome: Secondary | ICD-10-CM | POA: Diagnosis not present

## 2017-06-04 DIAGNOSIS — F17200 Nicotine dependence, unspecified, uncomplicated: Secondary | ICD-10-CM | POA: Diagnosis not present

## 2017-06-04 DIAGNOSIS — G43909 Migraine, unspecified, not intractable, without status migrainosus: Secondary | ICD-10-CM | POA: Diagnosis not present

## 2017-06-04 DIAGNOSIS — G40919 Epilepsy, unspecified, intractable, without status epilepticus: Secondary | ICD-10-CM | POA: Diagnosis not present

## 2017-06-11 ENCOUNTER — Emergency Department (HOSPITAL_COMMUNITY)
Admission: EM | Admit: 2017-06-11 | Discharge: 2017-06-11 | Disposition: A | Discharge status: Home / Self Care | Payer: Medicare Other | Attending: Emergency Medicine | Admitting: Emergency Medicine

## 2017-06-11 ENCOUNTER — Encounter (HOSPITAL_COMMUNITY): Payer: Self-pay | Admitting: Emergency Medicine

## 2017-06-11 DIAGNOSIS — F17210 Nicotine dependence, cigarettes, uncomplicated: Secondary | ICD-10-CM | POA: Insufficient documentation

## 2017-06-11 DIAGNOSIS — Z79899 Other long term (current) drug therapy: Secondary | ICD-10-CM | POA: Insufficient documentation

## 2017-06-11 DIAGNOSIS — R569 Unspecified convulsions: Secondary | ICD-10-CM | POA: Diagnosis not present

## 2017-06-11 LAB — CBC WITH DIFFERENTIAL/PLATELET
Basophils Absolute: 0 10*3/uL (ref 0.0–0.1)
Basophils Relative: 1 %
Eosinophils Absolute: 0.1 10*3/uL (ref 0.0–0.7)
Eosinophils Relative: 2 %
HCT: 39.6 % (ref 36.0–46.0)
HEMOGLOBIN: 12.8 g/dL (ref 12.0–15.0)
LYMPHS ABS: 1.6 10*3/uL (ref 0.7–4.0)
LYMPHS PCT: 43 %
MCH: 31.3 pg (ref 26.0–34.0)
MCHC: 32.3 g/dL (ref 30.0–36.0)
MCV: 96.8 fL (ref 78.0–100.0)
Monocytes Absolute: 0.4 10*3/uL (ref 0.1–1.0)
Monocytes Relative: 10 %
NEUTROS PCT: 44 %
Neutro Abs: 1.7 10*3/uL (ref 1.7–7.7)
Platelets: 340 10*3/uL (ref 150–400)
RBC: 4.09 MIL/uL (ref 3.87–5.11)
RDW: 13.9 % (ref 11.5–15.5)
WBC: 3.8 10*3/uL — AB (ref 4.0–10.5)

## 2017-06-11 LAB — BASIC METABOLIC PANEL
Anion gap: 7 (ref 5–15)
BUN: 5 mg/dL — AB (ref 6–20)
CALCIUM: 8.6 mg/dL — AB (ref 8.9–10.3)
CO2: 24 mmol/L (ref 22–32)
Chloride: 97 mmol/L — ABNORMAL LOW (ref 101–111)
Creatinine, Ser: 0.69 mg/dL (ref 0.44–1.00)
GFR calc Af Amer: >60 mL/min (ref >60)
GFR calc non Af Amer: >60 mL/min (ref >60)
GLUCOSE: 90 mg/dL (ref 65–99)
POTASSIUM: 3.8 mmol/L (ref 3.5–5.1)
Sodium: 128 mmol/L — ABNORMAL LOW (ref 135–145)

## 2017-06-11 LAB — CBG MONITORING, ED: Glucose-Capillary: 105 mg/dL — ABNORMAL HIGH (ref 65–99)

## 2017-06-11 MED ORDER — SODIUM CHLORIDE 0.9 % IV BOLUS (SEPSIS)
1000.0000 mL | Freq: Once | INTRAVENOUS | Status: AC
  Administered 2017-06-11: 1000 mL via INTRAVENOUS

## 2017-06-11 NOTE — ED Provider Notes (Signed)
[facility] EMERGENCY DEPARTMENT Provider Note   CSN: [id number] Arrival date & time: 06/11/17  [id number]     History   Chief Complaint Chief Complaint  Patient presents with  . Seizures    HPI [name] is a 36 y.o. female.  Level 5 caveat for postictal state.  Patient allegedly had a tonic-clonic seizure this morning.  On arrival via EMS, they noted a postictal state.  No prodromal illnesses.  Patient is allegedly taking Trileptal 600 mg 2 tablets bid and Fycompa 8 mg qd.   This is the third emergency department visit for a seizure disorder since 04/21/17.  Patient is feeling more back to normal now.      Past Medical History:  Diagnosis Date  . Back pain   . Chronic headaches   . Depression   . Genital warts    herpes  . Herpes   . Seizures (HCC) 03/15/2016    Patient Active Problem List   Diagnosis Date Noted  . Candidiasis 12/22/2016  . Status epilepticus, generalized convulsive (HCC) 11/20/2015  . Seizure (HCC) 11/19/2015  . Amenorrhea 09/28/2011  . Vitamin D deficiency 09/06/2011  . Headache 03/10/2011  . Seizures (HCC) 10/26/2008  . NICOTINE ADDICTION 12/10/2007  . Depression 12/10/2007    Past Surgical History:  Procedure Laterality Date  . CESAREAN SECTION     x 3    OB History    Gravida Para Term Preterm AB Living   3 3       3    SAB TAB Ectopic Multiple Live Births           3       Home Medications    Prior to Admission medications   Medication Sig Start Date End Date Taking? Authorizing Provider  Butalbital-APAP-Caffeine 50-300-40 MG CAPS Take 2 capsules by mouth every 12 (twelve) hours as needed. 03/12/17   [provider]  famciclovir (FAMVIR) 250 MG tablet Take 1 tablet (250 mg total) by mouth 2 (two) times daily. For itch 11/12/16   Kerri PerchesSimpson, Margaret E, MD  FLUoxetine (PROZAC) 20 MG capsule Take 1 capsule (20 mg total) by mouth daily. 02/20/16   Kerri PerchesSimpson, Margaret E, MD  FYCOMPA 2 MG TABS Take 1 tablet by mouth at  bedtime. 11/20/16   [provider]  HYDROcodone-acetaminophen (NORCO/VICODIN) 5-325 MG tablet Take 1 tablet by mouth daily as needed. 06/06/17   [provider]  lacosamide (VIMPAT) 50 MG TABS tablet Take 1 tablet (50 mg total) by mouth 2 (two) times daily. 07/26/16   Mancel BaleWentz, Elliott, MD  medroxyPROGESTERone (DEPO-PROVERA) 150 MG/ML injection INJECT 1 ML IN THE MUSCLE ONCE EVERY 3 MONTHS AS DIRECTED 02/03/17   Adline PotterGriffin, Jennifer A, NP  methocarbamol (ROBAXIN) 500 MG tablet Take 1-2 tablets by mouth every 8 (eight) hours as needed. 06/06/17   [provider]  nystatin (NYSTATIN) powder Apply topically 2 (two) times daily. 12/19/16   Kerri PerchesSimpson, Margaret E, MD  Oxcarbazepine (TRILEPTAL) 300 MG tablet Take 600 mg by mouth 2 (two) times daily.  08/16/16   [provider]  rizatriptan (MAXALT-MLT) 10 MG disintegrating tablet Take 10 mg by mouth as needed for migraine.     [provider]    Family History Family History  Problem Relation Age of Onset  . Hypertension Mother     Social History Social History   Tobacco Use  . Smoking status: Current Every Day Smoker    Packs/day: 1.00    Years: 10.00  Pack years: 10.00    Types: Cigarettes  . Smokeless tobacco: Never Used  Substance Use Topics  . Alcohol use: Yes    Alcohol/week: 1.2 oz    Types: 2 Cans of beer per week    Comment: occasionally  . Drug use: Yes    Types: Marijuana    Comment: 3 times a week, 08/05/2016 "not often"     Allergies   Morphine   Review of Systems Review of Systems  All other systems reviewed and are negative.    Physical Exam Updated Vital Signs BP 135/79   Pulse 73   Temp 98.8 F (37.1 C) (Oral)   Resp 18   Ht 5\' 2"  (1.575 m)   Wt 74.8 kg (165 lb)   SpO2 99%   BMI 30.18 kg/m   Physical Exam  Constitutional: She is oriented to person, place, and time. She appears well-developed and well-nourished.  HENT:  Head: Normocephalic and atraumatic.  Eyes:  Conjunctivae are normal.  Neck: Neck supple.  Cardiovascular: Normal rate and regular rhythm.  Pulmonary/Chest: Effort normal and breath sounds normal.  Abdominal: Soft. Bowel sounds are normal.  Musculoskeletal: Normal range of motion.  Neurological: She is alert and oriented to person, place, and time.  Skin: Skin is warm and dry.  Psychiatric: She has a normal mood and affect. Her behavior is normal.  Nursing note and vitals reviewed.    ED Treatments / Results  Labs (all labs ordered are listed, but only abnormal results are displayed) Labs Reviewed  CBC WITH DIFFERENTIAL/PLATELET - Abnormal; Notable for the following components:      Result Value   WBC 3.8 (*)    All other components within normal limits  BASIC METABOLIC PANEL - Abnormal; Notable for the following components:   Sodium 128 (*)    Chloride 97 (*)    BUN 5 (*)    Calcium 8.6 (*)    All other components within normal limits  CBG MONITORING, ED - Abnormal; Notable for the following components:   Glucose-Capillary 105 (*)    All other components within normal limits    EKG  EKG Interpretation  Date/Time:  Wednesday June 11 2017 09:07:30 EST Ventricular Rate:  74 PR Interval:    QRS Duration: 87 QT Interval:  433 QTC Calculation: 481 R Axis:   57 Text Interpretation:  Sinus rhythm Ventricular premature complex Nonspecific T abnormalities, anterior leads Confirmed by Donnetta Hutchingook, Javel Hersh ([id number]) on 06/11/2017 11:22:04 AM       Radiology No results found.  Procedures Procedures (including critical care time)  Medications Ordered in ED Medications  sodium chloride 0.9 % bolus 1,000 mL (0 mLs Intravenous Stopped 06/11/17 1134)     Initial Impression / Assessment and Plan / ED Course  I have reviewed the triage vital signs and the nursing notes.  Pertinent labs & imaging results that were available during my care of the patient were reviewed by me and considered in my medical decision making (see  chart for details).     Patient is hemodynamically stable.  Labs are unremarkable.  She was observed for 2 hours with no seizure activity noted.  I discussed her care with a neurologist Dr. Gerilyn Pilgrimoonquah.  He will evaluate her as an outpatient.  No medication changes at this moment.  Final Clinical Impressions(s) / ED Diagnoses   Final diagnoses:  Seizure [facility](HCC)    ED Discharge Orders    None       Donnetta Hutchingook, Laken Lobato, MD 06/11/17  1146  

## 2017-06-11 NOTE — ED Triage Notes (Addendum)
Seizure pta per pt daughter. Pt post ictal upon EMS arrival to home. cbg-[id number] per EMS.

## 2017-06-11 NOTE — Discharge Instructions (Signed)
I spoke with Dr. Gerilyn Pilgrimoonquah on the telephone.  He recommended no change in your medication for this moment.  Please call the office for a follow-up appointment early next week.  No driving until then.

## 2017-07-30 ENCOUNTER — Other Ambulatory Visit: Payer: Self-pay

## 2017-07-30 ENCOUNTER — Ambulatory Visit (INDEPENDENT_AMBULATORY_CARE_PROVIDER_SITE_OTHER): Payer: Medicare Other | Admitting: *Deleted

## 2017-07-30 ENCOUNTER — Encounter: Payer: Self-pay | Admitting: *Deleted

## 2017-07-30 DIAGNOSIS — Z3042 Encounter for surveillance of injectable contraceptive: Secondary | ICD-10-CM | POA: Diagnosis not present

## 2017-07-30 DIAGNOSIS — Z3202 Encounter for pregnancy test, result negative: Secondary | ICD-10-CM

## 2017-07-30 LAB — POCT URINE PREGNANCY: Preg Test, Ur: NEGATIVE

## 2017-07-30 MED ORDER — MEDROXYPROGESTERONE ACETATE 150 MG/ML IM SUSP
150.0000 mg | Freq: Once | INTRAMUSCULAR | Status: AC
  Administered 2017-07-30: 150 mg via INTRAMUSCULAR

## 2017-07-30 NOTE — Progress Notes (Signed)
Pt given DepoProvera 150mg IM right deltoid without complications. Advised pt to return in 12 weeks for next injection.  

## 2017-07-31 ENCOUNTER — Other Ambulatory Visit: Payer: Self-pay | Admitting: Family Medicine

## 2017-08-05 ENCOUNTER — Ambulatory Visit: Payer: Medicare Other

## 2017-08-07 ENCOUNTER — Ambulatory Visit: Payer: Medicare Other

## 2017-08-11 ENCOUNTER — Ambulatory Visit: Payer: Medicare Other

## 2017-09-08 ENCOUNTER — Other Ambulatory Visit: Payer: Self-pay | Admitting: Family Medicine

## 2017-09-12 ENCOUNTER — Emergency Department (HOSPITAL_COMMUNITY): Payer: Medicare Other

## 2017-09-12 ENCOUNTER — Encounter (HOSPITAL_COMMUNITY): Payer: Self-pay | Admitting: Emergency Medicine

## 2017-09-12 ENCOUNTER — Other Ambulatory Visit: Payer: Self-pay

## 2017-09-12 ENCOUNTER — Emergency Department (HOSPITAL_COMMUNITY)
Admission: EM | Admit: 2017-09-12 | Discharge: 2017-09-12 | Disposition: A | Discharge status: Home / Self Care | Payer: Medicare Other | Attending: Emergency Medicine | Admitting: Emergency Medicine

## 2017-09-12 DIAGNOSIS — F17210 Nicotine dependence, cigarettes, uncomplicated: Secondary | ICD-10-CM | POA: Diagnosis not present

## 2017-09-12 DIAGNOSIS — R42 Dizziness and giddiness: Secondary | ICD-10-CM | POA: Diagnosis not present

## 2017-09-12 DIAGNOSIS — H538 Other visual disturbances: Secondary | ICD-10-CM | POA: Insufficient documentation

## 2017-09-12 DIAGNOSIS — R51 Headache: Secondary | ICD-10-CM | POA: Diagnosis not present

## 2017-09-12 LAB — COMPREHENSIVE METABOLIC PANEL
ALK PHOS: 83 U/L (ref 38–126)
ALT: 10 U/L — AB (ref 14–54)
AST: 12 U/L — AB (ref 15–41)
Albumin: 3.6 g/dL (ref 3.5–5.0)
Anion gap: 9 (ref 5–15)
BILIRUBIN TOTAL: 0.2 mg/dL — AB (ref 0.3–1.2)
BUN: 7 mg/dL (ref 6–20)
CALCIUM: 8.6 mg/dL — AB (ref 8.9–10.3)
CHLORIDE: 96 mmol/L — AB (ref 101–111)
CO2: 22 mmol/L (ref 22–32)
CREATININE: 0.69 mg/dL (ref 0.44–1.00)
GFR calc Af Amer: >60 mL/min (ref >60)
Glucose, Bld: 90 mg/dL (ref 65–99)
Potassium: 4.2 mmol/L (ref 3.5–5.1)
Sodium: 127 mmol/L — ABNORMAL LOW (ref 135–145)
TOTAL PROTEIN: 6.9 g/dL (ref 6.5–8.1)

## 2017-09-12 LAB — CBC WITH DIFFERENTIAL/PLATELET
BASOS ABS: 0 10*3/uL (ref 0.0–0.1)
Basophils Relative: 0 %
Eosinophils Absolute: 0 10*3/uL (ref 0.0–0.7)
Eosinophils Relative: 1 %
HEMATOCRIT: 37.4 % (ref 36.0–46.0)
HEMOGLOBIN: 12.7 g/dL (ref 12.0–15.0)
LYMPHS ABS: 2.1 10*3/uL (ref 0.7–4.0)
LYMPHS PCT: 42 %
MCH: 31.6 pg (ref 26.0–34.0)
MCHC: 34 g/dL (ref 30.0–36.0)
MCV: 93 fL (ref 78.0–100.0)
Monocytes Absolute: 0.4 10*3/uL (ref 0.1–1.0)
Monocytes Relative: 8 %
NEUTROS ABS: 2.4 10*3/uL (ref 1.7–7.7)
Neutrophils Relative %: 49 %
Platelets: 334 10*3/uL (ref 150–400)
RBC: 4.02 MIL/uL (ref 3.87–5.11)
RDW: 13.3 % (ref 11.5–15.5)
WBC: 4.9 10*3/uL (ref 4.0–10.5)

## 2017-09-12 LAB — MAGNESIUM: MAGNESIUM: 1.7 mg/dL (ref 1.7–2.4)

## 2017-09-12 MED ORDER — SODIUM CHLORIDE 0.9 % IV BOLUS (SEPSIS)
1000.0000 mL | Freq: Once | INTRAVENOUS | Status: AC
Start: 2017-09-12 — End: 2017-09-12
  Administered 2017-09-12: 1000 mL via INTRAVENOUS

## 2017-09-12 NOTE — ED Provider Notes (Signed)
Emergency Department Provider Note   I have reviewed the triage vital signs and the nursing notes.   HISTORY  Chief Complaint Dizziness and Blurred Vision   HPI [name] is a 37 y.o. female with multiple past medical problems but most concerning for seizures and chronic headaches the presents the emergency department today with months of worsening intermittent dizziness and blurred vision.  Patient states she thought was her blood pressure was checked her blood pressure and it was normal so is unsure what the cause would be.  She states this happens once in a while sometimes associated seizure sometimes is not.  She is never been evaluated for this before.  States the blurry vision is worse in her right eye than her left.  No headaches.  No recent illnesses.  Did have a seizure about a week ago.  Does not know if she hit her head.  But she states it was a "bad one".  No fevers, cough, chest pain, back pain, abdominal pain, rashes. No other associated or modifying symptoms.    Past Medical History:  Diagnosis Date  . Back pain   . Chronic headaches   . Depression   . Genital warts    herpes  . Herpes   . Seizures (HCC) 03/15/2016    Patient Active Problem List   Diagnosis Date Noted  . Candidiasis 12/22/2016  . Status epilepticus, generalized convulsive (HCC) 11/20/2015  . Seizure (HCC) 11/19/2015  . Amenorrhea 09/28/2011  . Vitamin D deficiency 09/06/2011  . Headache 03/10/2011  . Seizures (HCC) 10/26/2008  . NICOTINE ADDICTION 12/10/2007  . Depression 12/10/2007    Past Surgical History:  Procedure Laterality Date  . CESAREAN SECTION     x 3    Current Outpatient Rx  . Order #: [id number] Class: Normal  . Order #: [id number] Class: Historical Med  . Order #: [id number] Class: Normal  . Order #: [id number] Class: Historical Med  . Order #: [id number] Class: Historical Med    Allergies Morphine  Family History  Problem Relation Age of Onset  . Hypertension  Mother     Social History Social History   Tobacco Use  . Smoking status: Current Every Day Smoker    Packs/day: 1.00    Years: 10.00    Pack years: 10.00    Types: Cigarettes  . Smokeless tobacco: Never Used  Substance Use Topics  . Alcohol use: Yes    Alcohol/week: 1.2 oz    Types: 2 Cans of beer per week    Comment: occasionally  . Drug use: Yes    Types: Marijuana    Comment: 3 times a week, 08/05/2016 "not often"    Review of Systems  All other systems negative except as documented in the HPI. All pertinent positives and negatives as reviewed in the HPI. ____________________________________________   PHYSICAL EXAM:  VITAL SIGNS: ED Triage Vitals  Enc Vitals Group     BP 09/12/17 1453 (!) 145/92     Pulse Rate 09/12/17 1453 80     Resp 09/12/17 1453 17     Temp 09/12/17 1453 98.4 F (36.9 C)     Temp Source 09/12/17 1453 Oral     SpO2 09/12/17 1453 100 %     Weight 09/12/17 1455 160 lb (72.6 kg)     Height 09/12/17 1455 5\' 2"  (1.575 m)    Constitutional: Alert and oriented. Well appearing and in no acute distress. Eyes: Conjunctivae are normal. PERRL. EOMI. Head: Atraumatic. Nose: No congestion/rhinnorhea. Mouth/Throat:  Mucous membranes are moist.  Oropharynx non-erythematous. Neck: No stridor.  No meningeal signs.   Cardiovascular: Normal rate, regular rhythm. Good peripheral circulation. Grossly normal heart sounds.   Respiratory: Normal respiratory effort.  No retractions. Lungs CTAB. Gastrointestinal: Soft and nontender. No distention.  Musculoskeletal: No lower extremity tenderness nor edema. No gross deformities of extremities. Neurologic:  Normal speech and language. Mild left facial droop. Difficult to tell if it involves forehead. Eyelids seem to be similar strength. No tongue deviation. Facial droop seems to be intermittent in intensity. No upper or lower extremity motor/sensation changes.  Skin:  Skin is warm, dry and intact. No rash  noted.   ____________________________________________   LABS (all labs ordered are listed, but only abnormal results are displayed)  Labs Reviewed  COMPREHENSIVE METABOLIC PANEL - Abnormal; Notable for the following components:      Result Value   Sodium 127 (*)    Chloride 96 (*)    Calcium 8.6 (*)    AST 12 (*)    ALT 10 (*)    Total Bilirubin 0.2 (*)    All other components within normal limits  CBC WITH DIFFERENTIAL/PLATELET  MAGNESIUM   ____________________________________________  EKG   EKG Interpretation  Date/Time:  Friday September 12 2017 15:58:44 EST Ventricular Rate:  72 PR Interval:    QRS Duration: 92 QT Interval:  395 QTC Calculation: 433 R Axis:   64 Text Interpretation:  Sinus rhythm Borderline T abnormalities, anterior leads No significant change since last tracing in 05/2017 Confirmed by Marily MemosMesner, Josphine Laffey [phone]([id number]) on 09/12/2017 4:37:21 PM       ____________________________________________  RADIOLOGY  Ct Head Wo Contrast  Result Date: 09/12/2017 CLINICAL DATA:  37 y/o F; headache, dizziness, blurred vision starting today. EXAM: CT HEAD WITHOUT CONTRAST TECHNIQUE: Contiguous axial images were obtained from the base of the skull through the vertex without intravenous contrast. COMPARISON:  11/18/2016 CT head.  10/27/2008 MRI head. FINDINGS: Brain: No evidence of acute infarction, hemorrhage, hydrocephalus, extra-axial collection or mass lesion/mass effect. Few stable nonspecific foci of hypoattenuation in subcortical white matter. Vascular: No hyperdense vessel or unexpected calcification. Skull: Normal. Negative for fracture or focal lesion. Sinuses/Orbits: No acute finding. Other: None. IMPRESSION: Stable negative CT of the head. Electronically Signed   By: Mitzi HansenLance  Furusawa-Stratton M.D.   On: 09/12/2017 16:39    ____________________________________________   PROCEDURES  Procedure(s) performed:    Procedures   ____________________________________________   INITIAL IMPRESSION / ASSESSMENT AND PLAN / ED COURSE  With mild facial neuro symptoms, will plan for neurologic workup.   Symptoms better, no stroke. Possible residual from bells vs mild bells? Out of any kind of window for treatment. Will dc to follow BP's and w/ PCP/neurology for same.    Pertinent labs & imaging results that were available during my care of the patient were reviewed by me and considered in my medical decision making (see chart for details).  ____________________________________________  FINAL CLINICAL IMPRESSION(S) / ED DIAGNOSES  Final diagnoses:  Dizziness     MEDICATIONS GIVEN DURING THIS VISIT:  Medications  sodium chloride 0.9 % bolus 1,000 mL (1,000 mLs Intravenous New Bag/Given 09/12/17 1614)     NEW OUTPATIENT MEDICATIONS STARTED DURING THIS VISIT:  Current Discharge Medication List      Note:  This note was prepared with assistance of Dragon voice recognition software. Occasional wrong-word or sound-a-like substitutions may have occurred due to the inherent limitations of voice recognition software.   Marily MemosMesner, Yelitza Reach, MD 09/12/17 [phone]

## 2017-09-12 NOTE — ED Triage Notes (Signed)
Patient states she will stay for evaluation.

## 2017-09-12 NOTE — ED Triage Notes (Signed)
Patient states she doesn't know if she wants to stay, reports she "just wanted to get her blood pressure checked." Patient was warned against leaving without evaluation by physician. Patient states she has to pick her daughter up. VSS, no slurred speech, moving all extremities, no facial asymmetry.

## 2017-09-12 NOTE — ED Notes (Signed)
Pt states she does not need to urinate at this time, aware of DO  

## 2017-09-12 NOTE — ED Triage Notes (Signed)
Patient reports dizziness and blurred vision that started at 1200 today. Patient states she has had similar symptoms before. Patient states she had a seizure on Thursday, had same symptoms. Patient reports she cannot see to drive. Patient reports difficulty with walking but was ambulatory to Triage without difficulty.

## 2017-09-16 DIAGNOSIS — Z79899 Other long term (current) drug therapy: Secondary | ICD-10-CM | POA: Diagnosis not present

## 2017-09-16 DIAGNOSIS — R569 Unspecified convulsions: Secondary | ICD-10-CM | POA: Diagnosis not present

## 2017-09-16 DIAGNOSIS — F17200 Nicotine dependence, unspecified, uncomplicated: Secondary | ICD-10-CM | POA: Diagnosis not present

## 2017-09-29 ENCOUNTER — Encounter: Payer: Self-pay | Admitting: Family Medicine

## 2017-10-02 ENCOUNTER — Telehealth: Payer: Self-pay

## 2017-10-02 DIAGNOSIS — R569 Unspecified convulsions: Secondary | ICD-10-CM

## 2017-10-02 NOTE — Telephone Encounter (Signed)
Doesn't want to see [name] for her seizures anymore. Wants a referral to a new dr, Etter SjogrenAdam Jessie. Please advise

## 2017-10-02 NOTE — Telephone Encounter (Signed)
Wants a referral to Dr Etter SjogrenAdam Jessie [phone] [place]

## 2017-10-03 NOTE — Telephone Encounter (Signed)
OK to refer to this oc as long he accepts her insurance, please verify this is the case , I will cosign the order, thanks

## 2017-10-06 ENCOUNTER — Other Ambulatory Visit: Payer: Self-pay | Admitting: Family Medicine

## 2017-10-07 NOTE — Addendum Note (Signed)
Addended by: Abner GreenspanHUDY, Tonesha Tsou H on: 10/07/2017 03:01 PM   Modules accepted: Orders

## 2017-10-07 NOTE — Telephone Encounter (Signed)
Done

## 2017-10-10 DIAGNOSIS — Z79899 Other long term (current) drug therapy: Secondary | ICD-10-CM | POA: Diagnosis not present

## 2017-10-10 DIAGNOSIS — F17200 Nicotine dependence, unspecified, uncomplicated: Secondary | ICD-10-CM | POA: Diagnosis not present

## 2017-10-10 DIAGNOSIS — G40909 Epilepsy, unspecified, not intractable, without status epilepticus: Secondary | ICD-10-CM | POA: Diagnosis not present

## 2017-10-10 DIAGNOSIS — G44209 Tension-type headache, unspecified, not intractable: Secondary | ICD-10-CM | POA: Diagnosis not present

## 2017-10-21 ENCOUNTER — Encounter: Payer: Self-pay | Admitting: Neurology

## 2017-10-22 ENCOUNTER — Other Ambulatory Visit: Payer: Self-pay | Admitting: Adult Health

## 2017-10-22 ENCOUNTER — Ambulatory Visit (INDEPENDENT_AMBULATORY_CARE_PROVIDER_SITE_OTHER): Payer: Medicare Other | Admitting: *Deleted

## 2017-10-22 ENCOUNTER — Ambulatory Visit: Payer: Medicare Other

## 2017-10-22 ENCOUNTER — Encounter: Payer: Self-pay | Admitting: *Deleted

## 2017-10-22 DIAGNOSIS — Z3202 Encounter for pregnancy test, result negative: Secondary | ICD-10-CM | POA: Diagnosis not present

## 2017-10-22 DIAGNOSIS — Z308 Encounter for other contraceptive management: Secondary | ICD-10-CM

## 2017-10-22 DIAGNOSIS — Z3042 Encounter for surveillance of injectable contraceptive: Secondary | ICD-10-CM

## 2017-10-22 LAB — POCT URINE PREGNANCY: Preg Test, Ur: NEGATIVE

## 2017-10-22 MED ORDER — MEDROXYPROGESTERONE ACETATE 150 MG/ML IM SUSP
150.0000 mg | Freq: Once | INTRAMUSCULAR | Status: AC
  Administered 2017-10-22: 150 mg via INTRAMUSCULAR

## 2017-10-22 NOTE — Progress Notes (Signed)
Pt here for Depo. Pt tolerated shot well. Return in 12 weeks for next shot. JSY 

## 2017-11-05 ENCOUNTER — Emergency Department (HOSPITAL_COMMUNITY)
Admission: EM | Admit: 2017-11-05 | Discharge: 2017-11-05 | Disposition: A | Discharge status: Home / Self Care | Payer: Medicare Other | Attending: Emergency Medicine | Admitting: Emergency Medicine

## 2017-11-05 ENCOUNTER — Encounter (HOSPITAL_COMMUNITY): Payer: Self-pay

## 2017-11-05 ENCOUNTER — Other Ambulatory Visit: Payer: Self-pay

## 2017-11-05 DIAGNOSIS — Z79899 Other long term (current) drug therapy: Secondary | ICD-10-CM | POA: Insufficient documentation

## 2017-11-05 DIAGNOSIS — F17210 Nicotine dependence, cigarettes, uncomplicated: Secondary | ICD-10-CM | POA: Diagnosis not present

## 2017-11-05 DIAGNOSIS — R569 Unspecified convulsions: Secondary | ICD-10-CM | POA: Insufficient documentation

## 2017-11-05 DIAGNOSIS — G40909 Epilepsy, unspecified, not intractable, without status epilepticus: Secondary | ICD-10-CM | POA: Diagnosis not present

## 2017-11-05 MED ORDER — LORAZEPAM 1 MG PO TABS
1.0000 mg | ORAL_TABLET | Freq: Once | ORAL | Status: AC
  Administered 2017-11-05: 1 mg via ORAL
  Filled 2017-11-05: qty 1

## 2017-11-05 NOTE — ED Triage Notes (Addendum)
Ems reports pt had a seizure at the laundry mat today, fell and hit head.  Denies any head or neck pain.  Pt was postictal upon arrival of ems.  Pt more alert at this time.  Pt was incontinent of urine.  Reports has been having seizures more frequently recently.    Pt unsure of the name of her seizure medication but says she hasn't missed any doses.  CBG 96 per ems.

## 2017-11-05 NOTE — Discharge Instructions (Signed)
You may return for worsening seizures

## 2017-11-05 NOTE — ED Provider Notes (Signed)
[facility] EMERGENCY DEPARTMENT Provider Note   CSN: [id number] Arrival date & time: 11/05/17  1636     History   Chief Complaint Chief Complaint  Patient presents with  . Seizures    HPI [name] is a 37 y.o. female.  HPI  The patient is a 37 year old female with known seizure disorder, history of generalized convulsive seizures, she has a history of depression, headache, tobacco abuse, she has had multiple visits to the emergency department for seizures according to my evaluation of the medical record.  She presents today with a chief complaint of seizure  The patient states that she does not remember what happened but per the paramedics who are the primary historian she was in the laundromat doing laundry when she developed a seizure.  This was witnessed but there is not much information.  The patient was found on the ground unresponsive when the paramedics arrived, she was quickly postictal and within about 5-10 minutes the patient to return close to her baseline.  She still does not remember any of the events, she definitely denies having no symptoms this morning, she states that she had no food and water today, she denies headache chest pain coughing shortness of breath neck pain blurred vision numbness weakness swelling of the legs, rashes, abdominal pain, nausea vomiting or diarrhea.  She states that she takes her medication religiously but cannot tell me the names of the antiseizure medications.  No further seizure activity was seen by paramedics she reports her blood sugar was 96  Past Medical History:  Diagnosis Date  . Back pain   . Chronic headaches   . Depression   . Genital warts    herpes  . Herpes   . Seizures (HCC) 03/15/2016    Patient Active Problem List   Diagnosis Date Noted  . Candidiasis 12/22/2016  . Status epilepticus, generalized convulsive (HCC) 11/20/2015  . Seizure (HCC) 11/19/2015  . Amenorrhea 09/28/2011  . Vitamin D deficiency  09/06/2011  . Headache 03/10/2011  . Seizures (HCC) 10/26/2008  . NICOTINE ADDICTION 12/10/2007  . Depression 12/10/2007    Past Surgical History:  Procedure Laterality Date  . CESAREAN SECTION     x 3     OB History    Gravida  3   Para  3   Term      Preterm      AB      Living  3     SAB      TAB      Ectopic      Multiple      Live Births  3            Home Medications    Prior to Admission medications   Medication Sig Start Date End Date Taking? Authorizing Provider  famciclovir (FAMVIR) 250 MG tablet TAKE 1 TABLET(250 MG) BY MOUTH TWICE DAILY FOR ITCHING 10/06/17   Kerri PerchesSimpson, Margaret E, MD  HYDROcodone-acetaminophen (NORCO) 7.5-325 MG tablet Take 1-2 tablets by mouth every 6 (six) hours as needed for moderate pain or severe pain (headache pain).     [provider]  medroxyPROGESTERone (DEPO-PROVERA) 150 MG/ML injection INJECT 1 ML IN THE MUSCLE ONCE EVERY 3 MONTHS AS DIRECTED 10/22/17   Adline PotterGriffin, Jennifer A, NP  OXTELLAR XR 600 MG TB24 Take 1,200 mg by mouth 2 (two) times daily. 08/12/17   [provider]  rizatriptan (MAXALT-MLT) 10 MG disintegrating tablet Take 10 mg by mouth as needed for migraine.  [provider]  ZIPSOR 25 MG CAPS TK 1 C PO Q 6 H PRN 10/03/17   [provider]    Family History Family History  Problem Relation Age of Onset  . Hypertension Mother     Social History Social History   Tobacco Use  . Smoking status: Current Every Day Smoker    Packs/day: 1.00    Years: 10.00    Pack years: 10.00    Types: Cigarettes  . Smokeless tobacco: Never Used  Substance Use Topics  . Alcohol use: Yes    Alcohol/week: 1.2 oz    Types: 2 Cans of beer per week    Comment: occasionally  . Drug use: Yes    Types: Marijuana    Comment: 3 times a week, 08/05/2016 "not often"     Allergies   Morphine   Review of Systems Review of Systems  All other systems reviewed and are  negative.    Physical Exam Updated Vital Signs BP 114/76   Pulse 76   Temp 98.8 F (37.1 C) (Oral)   Resp 18   Ht 5\' 2"  (1.575 m)   Wt 68 kg (150 lb)   SpO2 97%   BMI 27.44 kg/m   Physical Exam  Constitutional: She appears well-developed and well-nourished. No distress.  HENT:  Head: Normocephalic and atraumatic.  Mouth/Throat: Oropharynx is clear and moist. No oropharyngeal exudate.  Bilateral lateral tongue injury, no lacerations but there is contused tissue, dentition intact, no trismus or torticollis  Eyes: Pupils are equal, round, and reactive to light. Conjunctivae and EOM are normal. Right eye exhibits no discharge. Left eye exhibits no discharge. No scleral icterus.  Neck: Normal range of motion. Neck supple. No JVD present. No thyromegaly present.  Cardiovascular: Normal rate, regular rhythm, normal heart sounds and intact distal pulses. Exam reveals no gallop and no friction rub.  No murmur heard. Pulmonary/Chest: Effort normal and breath sounds normal. No respiratory distress. She has no wheezes. She has no rales.  Abdominal: Soft. Bowel sounds are normal. She exhibits no distension and no mass. There is no tenderness.  Musculoskeletal: Normal range of motion. She exhibits no edema or tenderness.  Lymphadenopathy:    She has no cervical adenopathy.  Neurological: She is alert. Coordination normal.  Speech is clear, there is no facial droop, coordination is normal by finger-nose-finger, no pronator drift, normal strength in all 4 extremities, speech is clear, memory is intact except for the events surrounding the seizure  Skin: Skin is warm and dry. No rash noted. No erythema.  Psychiatric: She has a normal mood and affect. Her behavior is normal.  Nursing note and vitals reviewed.    ED Treatments / Results  Labs (all labs ordered are listed, but only abnormal results are displayed) Labs Reviewed - No data to display  EKG None  Radiology No results  found.  Procedures Procedures (including critical care time)  Medications Ordered in ED Medications  LORazepam (ATIVAN) tablet 1 mg (1 mg Oral Given 11/05/17 1700)     Initial Impression / Assessment and Plan / ED Course  I have reviewed the triage vital signs and the nursing notes.  Pertinent labs & imaging results that were available during my care of the patient were reviewed by me and considered in my medical decision making (see chart for details).    The patient is well-appearing, vital signs are unremarkable, she has evidence of seizure activity with tongue biting and some urinary incontinence, she is  not actively seizing and is back to her baseline.  Awaiting family's arrival to verify medications though according to past charts that she takes Vimpat and oxcarbazepine  The patient is known to have frequent poorly controlled seizures, despite taking her medications adequately she is still seen fairly frequently for seizures and breakthrough seizures.  I do not see any trauma to the head, she has no neck pain and no neurologic dysfunction, CT scan is not indicated at this time  The patient was reevaluated, she is remained in her normal mental status, she was given 1 mg of Ativan and has had no further seizures, she is now walking around the room asking if she can be discharged.  She reports that she does have frequent seizures, her family members are here and they corroborate this.  She is being followed by neurology and has a new appointment within the next couple of weeks with a different neurologist which she is excited for.  At this time I do not think there is any necessity to change the patient's medications, she does not drive and is aware of the restrictions with her history of seizures.  Family members are in agreement to drive her home.  Final Clinical Impressions(s) / ED Diagnoses   Final diagnoses:  Seizure [facility])    ED Discharge Orders    None       Eber Hong,  MD 11/05/17 1727

## 2017-11-14 ENCOUNTER — Other Ambulatory Visit: Payer: Self-pay

## 2017-11-14 ENCOUNTER — Ambulatory Visit (INDEPENDENT_AMBULATORY_CARE_PROVIDER_SITE_OTHER): Payer: Medicare Other | Admitting: Neurology

## 2017-11-14 VITALS — BP 108/76 | HR 89 | Ht 61.5 in | Wt 150.0 lb

## 2017-11-14 DIAGNOSIS — G40019 Localization-related (focal) (partial) idiopathic epilepsy and epileptic syndromes with seizures of localized onset, intractable, without status epilepticus: Secondary | ICD-10-CM | POA: Diagnosis not present

## 2017-11-14 MED ORDER — SUMATRIPTAN SUCCINATE 25 MG PO TABS: ORAL_TABLET | ORAL | 6 refills | Status: DC

## 2017-11-14 MED ORDER — ZONISAMIDE 100 MG PO CAPS: ORAL_CAPSULE | ORAL | 6 refills | Status: DC

## 2017-11-14 MED ORDER — METHOCARBAMOL 500 MG PO TABS: 500.0000 mg | ORAL_TABLET | Freq: Three times a day (TID) | ORAL | 5 refills | Status: DC | PRN

## 2017-11-14 MED ORDER — OXTELLAR XR 600 MG PO TB24: 1200.0000 mg | ORAL_TABLET | Freq: Two times a day (BID) | ORAL | 6 refills | Status: DC

## 2017-11-14 NOTE — Progress Notes (Signed)
NEUROLOGY CONSULTATION NOTE  [name] MRN: [id number] DOB: 01/07/1981  Referring provider: Dr. Syliva Levine Primary care provider: Dr. Syliva Levine  Reason for consult:  seizures  Dear [name]:  Thank you for your kind referral of [name] for consultation of the above symptoms. Although her history is well known to you, please allow me to reiterate it for the purpose of our medical record. The patient was accompanied to the clinic by her daughter who also provides collateral information. Records and images were personally reviewed where available.  HISTORY OF PRESENT ILLNESS: This is a 37 year old right-handed woman with a history of migraines, back pain, and seizures, presenting to establish care. Seizures started around 2010 when she started having nocturnal convulsions with tongue bite and urinary incontinence. She would wake up with her children telling her what happened. She would be slightly confused but back to baseline quickly. She was on Keppra and Lamictal with no change in seizure frequency. At that time she was also drinking heavily, but even with decrease in alcohol intake, had not noticed any change in frequency. She was admitted to [facility] for 7 days in October 2012, typical episodes were not captured. EMU report unavailable for review, on discharge summary it was noted there were incidental left-sided sharp waves of which there may or ma not be a correlation to her spells. Keppra stopped and Lamictal dose increased. She was seeing neurologist [name], records unavailable for review, but it appears Fycompa was added to Lamictal. She reported worsening of seizures, now occurring in wakefulness, and was admitted for another 7-day EMU stay at [facility] in January 2018, again typical events were not captured. Baseline EEG was abnormal with intermittent left anterior to midtemporal slow waves, sharply contoured bursts of theta slowing over the left  temporal region, as well as intermittent right temporal theta and delta slowing. There were also frequent 3-4 Hz very brief intermediate duration fluctuating runs of rhythmic delta activities (LRDA) maximal at F7. Occasional very brief runs were also seen maximal at F8.There were occasional independent left temporal and right temporal sharp waves. At times left temporal discharges have a wide field. There are ER visits in April, May, October, November 2018 for seizures, medications listed included Vimpat, Fycompa, and Oxtellar. Her last ER visit was on 11/05/17, taking Oxtellar 1200mg  BID without side effects. She reports compliance to medication.  She brings a calendar of her seizures in 2019. She had 2 in January, 2 in February, 1 in March, 3 so far in April. The last seizure occurred on 11/11/17 out of sleep, she woke up with a lip bite. She has had a lot of her seizures occur while playing at the [facility]. Family has witnessed the seizures, describing staring/unresponsiveness with lip smacking, then convulsions with head turn to the right. She denies any prior warning symptoms, no post-ictal focal weakness. Her entire body feels sore. No clear seizure triggers, she denies any sleep deprivation or missed medications. She has significantly cut down on alcohol, drinking 1 beer on the weekend.   She denies any olfactory/gustatory hallucinations, deja vu, rising epigastric sensation, focal numbness/tingling/weakness, myoclonic jerks. She has migraines after a seizure and had a prescription for Norco. She also has Zipsor and Robaxin to take after seizures. She denies any dizziness, diplopia, dysarthria/dysphagia, neck/back pain, bowel/bladder dysfunction.   Epilepsy Risk Factors:  Her brother had seizures in childhood. She had a normal birth and early development.  There is no history of  febrile convulsions, CNS infections such as meningitis/encephalitis, significant traumatic brain injury, neurosurgical  procedures, or family history of seizures. She has a scar over the left temporal region after hitting her head on a window, she denies any loss of consciousness with this.   Prior AEDs: Lamictal, Keppra, Fycompa, Vimpat Laboratory Data:  EEGs: as above MRI: MRI brain with and without contrast in 10/2008 was unremarkable with scatted white matter changes   PAST MEDICAL HISTORY: Past Medical History:  Diagnosis Date  . Back pain   . Chronic headaches   . Depression   . Genital warts    herpes  . Herpes   . Seizures (HCC) 03/15/2016    PAST SURGICAL HISTORY: Past Surgical History:  Procedure Laterality Date  . CESAREAN SECTION     x 3    MEDICATIONS: Current Outpatient Medications on File Prior to Visit  Medication Sig Dispense Refill  . famciclovir (FAMVIR) 250 MG tablet TAKE 1 TABLET(250 MG) BY MOUTH TWICE DAILY FOR ITCHING 60 tablet 0  . HYDROcodone-acetaminophen (NORCO) 7.5-325 MG tablet Take 1-2 tablets by mouth every 6 (six) hours as needed for moderate pain or severe pain (headache pain).     . medroxyPROGESTERone (DEPO-PROVERA) 150 MG/ML injection INJECT 1 ML IN THE MUSCLE ONCE EVERY 3 MONTHS AS DIRECTED 1 mL 3  . OXTELLAR XR 600 MG TB24 Take 1,200 mg by mouth 2 (two) times daily.  2  . rizatriptan (MAXALT-MLT) 10 MG disintegrating tablet Take 10 mg by mouth as needed for migraine.     [name]. ZIPSOR 25 MG CAPS TK 1 C PO Q 6 H PRN  1   No current facility-administered medications on file prior to visit.     ALLERGIES: Allergies  Allergen Reactions  . Morphine Other (See Comments)    Unknown Reaction     FAMILY HISTORY: Family History  Problem Relation Age of Onset  . Hypertension Mother     SOCIAL HISTORY: Social History   Socioeconomic History  . Marital status: Single    Spouse name: Not on file  . Number of children: 2  . Years of education: 210, GED  . Highest education level: Not on file  Occupational History    Comment: na  Social Needs  . Financial  resource strain: Not on file  . Food insecurity:    Worry: Not on file    Inability: Not on file  . Transportation needs:    Medical: Not on file    Non-medical: Not on file  Tobacco Use  . Smoking status: Current Every Day Smoker    Packs/day: 1.00    Years: 10.00    Pack years: 10.00    Types: Cigarettes  . Smokeless tobacco: Never Used  Substance and Sexual Activity  . Alcohol use: Yes    Alcohol/week: 1.2 oz    Types: 2 Cans of beer per week    Comment: occasionally  . Drug use: Yes    Types: Marijuana    Comment: 3 times a week, 08/05/2016 "not often"  . Sexual activity: Not Currently    Birth control/protection: Injection  Lifestyle  . Physical activity:    Days per week: Not on file    Minutes per session: Not on file  . Stress: Not on file  Relationships  . Social connections:    Talks on phone: Not on file    Gets together: Not on file    Attends religious service: Not on file    Active member  of club or organization: Not on file    Attends meetings of clubs or organizations: Not on file    Relationship status: Not on file  . Intimate partner violence:    Fear of current or ex partner: Not on file    Emotionally abused: Not on file    Physically abused: Not on file    Forced sexual activity: Not on file  Other Topics Concern  . Not on file  Social History Narrative   Lives  With 2 children in home   Caffeine -Pepsi 2 daily    REVIEW OF SYSTEMS: Constitutional: No fevers, chills, or sweats, no generalized fatigue, change in appetite Eyes: No visual changes, double vision, eye pain Ear, nose and throat: No hearing loss, ear pain, nasal congestion, sore throat Cardiovascular: No chest pain, palpitations Respiratory:  No shortness of breath at rest or with exertion, wheezes GastrointestinaI: No nausea, vomiting, diarrhea, abdominal pain, fecal incontinence Genitourinary:  No dysuria, urinary retention or frequency Musculoskeletal:  No neck pain, back  pain Integumentary: No rash, pruritus, skin lesions Neurological: as above Psychiatric: No depression, insomnia, anxiety Endocrine: No palpitations, fatigue, diaphoresis, mood swings, change in appetite, change in weight, increased thirst Hematologic/Lymphatic:  No anemia, purpura, petechiae. Allergic/Immunologic: no itchy/runny eyes, nasal congestion, recent allergic reactions, rashes  PHYSICAL EXAM: Vitals:   11/14/17 1052  BP: 108/76  Pulse: 89  SpO2: 98%   General: No acute distress Head:  Normocephalic/atraumatic Eyes: Fundoscopic exam shows bilateral sharp discs, no vessel changes, exudates, or hemorrhages Neck: supple, no paraspinal tenderness, full range of motion Back: No paraspinal tenderness Heart: regular rate and rhythm Lungs: Clear to auscultation bilaterally. Vascular: No carotid bruits. Skin/Extremities: No rash, no edema Neurological Exam: Mental status: alert and oriented to person, place, and time, no dysarthria or aphasia, Fund of knowledge is appropriate.  Recent and remote memory are intact. 3/3 delayed recall. Attention and concentration are normal.    Able to name objects and repeat phrases. Cranial nerves: CN I: not tested CN II: pupils equal, round and reactive to light, visual fields intact, fundi unremarkable. CN III, IV, VI:  full range of motion, no nystagmus, no ptosis CN V: facial sensation intact CN VII: upper and lower face symmetric CN VIII: hearing intact to finger rub CN IX, X: gag intact, uvula midline CN XI: sternocleidomastoid and trapezius muscles intact CN XII: tongue midline Bulk & Tone: normal, no fasciculations. Motor: 5/5 throughout with no pronator drift. Sensation: intact to light touch, cold, pin, vibration and joint position sense.  No extinction to double simultaneous stimulation.  Romberg test negative Deep Tendon Reflexes: brisk +2 throughout, no ankle clonus Plantar responses: downgoing bilaterally Cerebellar: no  incoordination on finger to nose, heel to shin. No dysdiadochokinesia Gait: narrow-based and steady, able to tandem walk adequately. 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 She has had 2 prior 7-day EMU admissions where typical events were not captured, however most recent baseline EEG showed bilateral temporal sharp waves and focal slowing. A repeat MRI brain with and without contrast will be ordered. We discussed the diagnosis of intractable epilepsy, she has been on therapeutic doses at least 2 AEDs with continued seizures. Last seizure was on 11/11/17 on Oxtellar 1200mg  BID. We discussed  adding on Zonisamide, side effects were discussed. We discussed other options such as VNS and epilepsy surgery evaluation. She has no pregnancy plans. She has migraines and will try prn Imitrex for rescue. She knows to minimize intake to 2-3 a week to avoid rebound headaches. She does not drive and is aware of [place] driving laws to stop driving after a seizure, until 6 months seizure-free. She will continue her seizure calendar and follow-up in 3 months.  Thank you for allowing me to participate in the care of this patient. Please do not hesitate to call for any questions or concerns.   Patrcia Dolly, M.D.  CC: Elizabeth. Lodema Levine

## 2017-11-14 NOTE — Patient Instructions (Addendum)
1. Schedule MRI brain with and without contrast  We have sent a referral to [facility] for your MRI and they will call you directly to schedule your appt. They are located at [address] If you need to contact them directly please call [phone].   2. Start Zonisamide 100mg : Take 1 capsule every night for 2 weeks, then increase to 2 capsules every night for 2 weeks, then increase to 3 capsules every night and continue 3. Continue Oxtellar 600mg  2 tablets twice a day 4. Try imitrex 25mg  at onset of migraine, do not take more than 2-3 a week 5. Continue with seizure calendar 6. Follow-up in 3 months, call for any changes  Seizure Precautions: 1. If medication has been prescribed for you to prevent seizures, take it exactly as directed.  Do not stop taking the medicine without talking to your doctor first, even if you have not had a seizure in a long time.   2. Avoid activities in which a seizure would cause danger to yourself or to others.  Don't operate dangerous machinery, swim alone, or climb in high or dangerous places, such as on ladders, roofs, or girders.  Do not drive unless your doctor says you may.  3. If you have any warning that you may have a seizure, lay down in a safe place where you can't hurt yourself.    4.  No driving for 6 months from last seizure, as per [place]  [place] law.   Please refer to the following link on the Epile[employer] Foundation of [employer]'s website for more information: http://www.epilepsyfoundation.org/answerplace/Social/driving/drivingu.cfm   5.  Maintain good sleep hygiene. Avoid alcohol.  6.  Notify your neurology if you are planning pregnancy or if you become pregnant.  7.  Contact your doctor if you have any problems that may be related to the medicine you are taking.  8.  Call 911 and bring the patient back to the ED if:        A.  The seizure lasts longer than 5 minutes.       B.  The patient doesn't awaken shortly after the  seizure  C.  The patient has new problems such as difficulty seeing, speaking or moving  D.  The patient was injured during the seizure  E.  The patient has a temperature over 102 F (39C)  F.  The patient vomited and now is having trouble breathing

## 2017-11-16 ENCOUNTER — Ambulatory Visit
Admission: RE | Admit: 2017-11-16 | Discharge: 2017-11-16 | Disposition: A | Discharge status: Home / Self Care | Payer: Medicare Other | Source: Ambulatory Visit | Attending: Neurology | Admitting: Neurology

## 2017-11-16 DIAGNOSIS — R569 Unspecified convulsions: Secondary | ICD-10-CM | POA: Diagnosis not present

## 2017-11-16 DIAGNOSIS — G40019 Localization-related (focal) (partial) idiopathic epilepsy and epileptic syndromes with seizures of localized onset, intractable, without status epilepticus: Secondary | ICD-10-CM

## 2017-11-16 MED ORDER — GADOBENATE DIMEGLUMINE 529 MG/ML IV SOLN
14.0000 mL | Freq: Once | INTRAVENOUS | Status: AC | PRN
  Administered 2017-11-16: 14 mL via INTRAVENOUS

## 2017-11-17 ENCOUNTER — Encounter: Payer: Self-pay | Admitting: Neurology

## 2017-11-17 ENCOUNTER — Telehealth: Payer: Self-pay | Admitting: Neurology

## 2017-11-17 DIAGNOSIS — G40019 Localization-related (focal) (partial) idiopathic epilepsy and epileptic syndromes with seizures of localized onset, intractable, without status epilepticus: Secondary | ICD-10-CM | POA: Insufficient documentation

## 2017-11-17 NOTE — Telephone Encounter (Signed)
1.     Name of medication: Sumatriptan  2.     How are you currently taking this medication (dosage and times per day)?   3.     Are you having a reaction (difficulty breathing--STAT)?   4.     What is your medication issue? Pt said taking this medication makes her head hurt worse and she is not taking them and wants to know if Dr Karel Jarvis can prescribe something else

## 2017-11-17 NOTE — Telephone Encounter (Signed)
Pls let her know the MRI brain is normal, no evidence of tumor, stroke, or bleed. Looks like medicare will cover rizatriptan , if she is agreeable to trying this for rescue, pls send #10 tabs with 5 refills, take 1 tab at migraine onset, do not take more than 2-3 a week. Thanks

## 2017-11-18 ENCOUNTER — Other Ambulatory Visit: Payer: Self-pay

## 2017-11-18 MED ORDER — RIZATRIPTAN BENZOATE 10 MG PO TBDP: 10.0000 mg | ORAL_TABLET | ORAL | 5 refills | Status: DC | PRN

## 2017-11-18 NOTE — Telephone Encounter (Signed)
Spoke with pt relaying message below.  Verified pt preferred pharmacy.  Rx sent

## 2017-11-25 ENCOUNTER — Encounter: Payer: Self-pay | Admitting: Family Medicine

## 2017-11-25 ENCOUNTER — Ambulatory Visit (INDEPENDENT_AMBULATORY_CARE_PROVIDER_SITE_OTHER): Payer: Medicare Other | Admitting: Family Medicine

## 2017-11-25 VITALS — BP 130/82 | HR 89 | Resp 16 | Ht 62.0 in | Wt 149.0 lb

## 2017-11-25 DIAGNOSIS — F329 Major depressive disorder, single episode, unspecified: Secondary | ICD-10-CM | POA: Diagnosis not present

## 2017-11-25 DIAGNOSIS — F32A Depression, unspecified: Secondary | ICD-10-CM

## 2017-11-25 DIAGNOSIS — G4489 Other headache syndrome: Secondary | ICD-10-CM | POA: Diagnosis not present

## 2017-11-25 DIAGNOSIS — E559 Vitamin D deficiency, unspecified: Secondary | ICD-10-CM

## 2017-11-25 DIAGNOSIS — F17210 Nicotine dependence, cigarettes, uncomplicated: Secondary | ICD-10-CM | POA: Diagnosis not present

## 2017-11-25 DIAGNOSIS — F17200 Nicotine dependence, unspecified, uncomplicated: Secondary | ICD-10-CM | POA: Diagnosis not present

## 2017-11-25 DIAGNOSIS — G40019 Localization-related (focal) (partial) idiopathic epilepsy and epileptic syndromes with seizures of localized onset, intractable, without status epilepticus: Secondary | ICD-10-CM

## 2017-11-25 DIAGNOSIS — R569 Unspecified convulsions: Secondary | ICD-10-CM | POA: Diagnosis not present

## 2017-11-25 NOTE — Patient Instructions (Addendum)
F/U in 3 months, call if you need me sooner  Need to see mental health for treatment of depression  Now smoking 1 PPD of cigarettes , glad you want to quit and that you have patches, we will help you with this, continue to work with the quit line also    TSH and vitamin D levels today   Call neurology about headache treatment

## 2017-11-25 NOTE — Progress Notes (Signed)
   [name]     MRN: [id number]      DOB: 19-May-1981   HPI [name] is here for follow up of chroniic problems, still has uncontrolled seizure activity, and reports uncontrolled migraines and intolerance of the 2 medicarions prescribed for acute headache imitrex and maxalt. I have directed her  To call the neurologist for direction Report 3 seizures in the past months and 3 ED visits  Concerned about weight loss   ROS Denies recent fever or chills. Denies sinus pressure, nasal congestion, ear pain or sore throat. Denies chest congestion, productive cough or wheezing. Denies chest pains, palpitations and leg swelling Denies abdominal pain, nausea, vomiting,diarrhea or constipation.   Denies dysuria, frequency, hesitancy or incontinence. Denies joint pain, swelling and limitation in mobility. Denies skin break down or rash.   PE  BP 130/82   Pulse 89   Resp 16   Ht  (1.575 m)   Wt 149 lb (67.6 kg)   SpO2 99%   BMI 27.25 kg/m   Patient alert and oriented and in no cardiopulmonary distress.  HEENT: No facial asymmetry, EOMI,   oropharynx pink and moist.  Neck supple no JVD, no mass.  Chest: Clear to auscultation bilaterally.  CVS: S1, S2 no murmurs, no S3.Regular rate.  ABD: Soft non tender.   Ext: No edema  MS: Adequate ROM spine, shoulders, hips and knees.  Skin: Intact, no ulcerations or rash noted.  Psych: Good eye contact, flat affect. Memory intact  depressed appearing.  CNS: CN 2-12 intact, power,  normal throughout.no focal deficits noted.   Assessment & Plan  Depression Not suicidal or homicidal needs and wants psych treatment refer  Localization-related (focal) (partial) idiopathic epilepsy and epileptic syndromes with seizures of localized onset, intractable, without status epilepticus (HCC) Reports recurrent uncontrolled seizures, recently started with new neurologist, and still seizing , I have encouraged her to be diligent in taking  meds as prescribed and on time  Headache Reports intolerance to both Imitrex and maxalt which she was recently started on, I have advised her to work with her neurologist re migraine mx and give her medications a chance to work  NICOTINE ADDICTION Asked: confirms current cigarette smoker Assess: confirms wants to quit Arrange:given 1800 QUIT NOW # , and Hydrographic surveyor as well as counseled for 5 minutes Advised : need to quit to reduce risk of cancer, stroke , heart disease and lung failure Arrange: follow up in 6 to 8 weeks

## 2017-11-27 ENCOUNTER — Telehealth: Payer: Self-pay | Admitting: Neurology

## 2017-11-27 NOTE — Telephone Encounter (Signed)
Patient called regarding her Migraine headaches. She said the  dissolveable pills have made her get sick and not seeming to help at all. She also was asking did anything show in her MRI that would be causing the migraine headaches? Please Call. Thanks

## 2017-11-28 NOTE — Assessment & Plan Note (Signed)
Not suicidal or homicidal needs and wants psych treatment refer

## 2017-11-28 NOTE — Assessment & Plan Note (Signed)
Reports recurrent uncontrolled seizures, recently started with new neurologist, and still seizing , I have encouraged her to be diligent in taking meds as prescribed and on time

## 2017-11-28 NOTE — Assessment & Plan Note (Signed)
Asked: confirms current cigarette smoker Assess: confirms wants to quit Arrange:given 1800 QUIT NOW # , and Hydrographic surveyor as well as counseled for 5 minutes Advised : need to quit to reduce risk of cancer, stroke , heart disease and lung failure Arrange: follow up in 6 to 8 weeks

## 2017-11-28 NOTE — Assessment & Plan Note (Signed)
Reports intolerance to both Imitrex and maxalt which she was recently started on, I have advised her to work with her neurologist re migraine mx and give her medications a chance to work

## 2017-12-02 ENCOUNTER — Other Ambulatory Visit: Payer: Self-pay | Admitting: Family Medicine

## 2017-12-03 NOTE — Telephone Encounter (Signed)
Pls let her know that the MRI brain is actually normal in patients with migraines. Medicare may allow [place] to try naratriptan for migraines. If she would like to try, pls send in Rx for naratriptan  as needed for migraine, do not take more than 2-3 times a week, #10. Thanks

## 2017-12-05 ENCOUNTER — Other Ambulatory Visit: Payer: Self-pay

## 2017-12-05 MED ORDER — NARATRIPTAN HCL 1 MG PO TABS: 1.0000 mg | ORAL_TABLET | Freq: Once | ORAL | 3 refills | Status: DC | PRN

## 2017-12-05 NOTE — Telephone Encounter (Signed)
Patient calling back because her headaches are still really bad. Please call. Thanks

## 2017-12-05 NOTE — Telephone Encounter (Signed)
Spoke with pt relaying message below.  Pt would like to try nanratriptan.  Rx called into pt's verified preferred pharmacy.

## 2017-12-11 ENCOUNTER — Encounter: Payer: Self-pay | Admitting: Neurology

## 2018-01-09 ENCOUNTER — Encounter

## 2018-01-09 ENCOUNTER — Ambulatory Visit: Payer: Medicare Other | Admitting: Neurology

## 2018-02-03 DIAGNOSIS — R569 Unspecified convulsions: Secondary | ICD-10-CM | POA: Diagnosis not present

## 2018-02-03 DIAGNOSIS — R410 Disorientation, unspecified: Secondary | ICD-10-CM | POA: Diagnosis not present

## 2018-02-04 ENCOUNTER — Ambulatory Visit: Payer: Medicare Other

## 2018-02-09 NOTE — Progress Notes (Deleted)
Psychiatric Initial Adult Assessment   Patient Identification: [name] MRN:  [id number] Date of Evaluation:  02/09/2018 Referral Source: *** Chief Complaint:   Visit Diagnosis: No diagnosis found.  History of Present Illness:   [name] is a 37 y.o. year old female with a history of depression, intractable epilepsy, who is referred for     Associated Signs/Symptoms: Depression Symptoms:  {DEPRESSION SYMPTOMS:20000} (Hypo) Manic Symptoms:  {BHH MANIC SYMPTOMS:22872} Anxiety Symptoms:  {BHH ANXIETY SYMPTOMS:22873} Psychotic Symptoms:  {BHH PSYCHOTIC SYMPTOMS:22874} PTSD Symptoms: {BHH PTSD SYMPTOMS:22875}  Past Psychiatric History:  Outpatient:  Psychiatry admission:  Previous suicide attempt:  Past trials of medication:  History of violence:   Previous Psychotropic Medications: {YES/NO:21197}  Substance Abuse History in the last 12 months:  {yes no:314532}  Consequences of Substance Abuse: {BHH CONSEQUENCES OF SUBSTANCE ABUSE:22880}  Past Medical History:  Past Medical History:  Diagnosis Date  . Back pain   . Chronic headaches   . Depression   . Genital warts    herpes  . Herpes   . Seizures (HCC) 03/15/2016    Past Surgical History:  Procedure Laterality Date  . CESAREAN SECTION     x 3    Family Psychiatric History: ***  Family History:  Family History  Problem Relation Age of Onset  . Hypertension Mother     Social History:   Social History   Socioeconomic History  . Marital status: Single    Spouse name: Not on file  . Number of children: 2  . Years of education: 60, GED  . Highest education level: Not on file  Occupational History    Comment: na  Social Needs  . Financial resource strain: Not on file  . Food insecurity:    Worry: Not on file    Inability: Not on file  . Transportation needs:    Medical: Not on file    Non-medical: Not on file  Tobacco Use  . Smoking status: Current Every Day Smoker    Packs/day:  1.00    Years: 10.00    Pack years: 10.00    Types: Cigarettes  . Smokeless tobacco: Never Used  Substance and Sexual Activity  . Alcohol use: Yes    Alcohol/week: 1.2 oz    Types: 2 Cans of beer per week    Comment: occasionally  . Drug use: Yes    Types: Marijuana    Comment: 3 times a week, 08/05/2016 "not often"  . Sexual activity: Not Currently    Birth control/protection: Injection  Lifestyle  . Physical activity:    Days per week: Not on file    Minutes per session: Not on file  . Stress: Not on file  Relationships  . Social connections:    Talks on phone: Not on file    Gets together: Not on file    Attends religious service: Not on file    Active member of club or organization: Not on file    Attends meetings of clubs or organizations: Not on file    Relationship status: Not on file  Other Topics Concern  . Not on file  Social History Narrative   Lives  With 2 children in home   Caffeine -Pepsi 2 daily    Additional Social History: ***  Allergies:   Allergies  Allergen Reactions  . Morphine Other (See Comments)    Unknown Reaction     Metabolic Disorder Labs: No results found for: HGBA1C, MPG No results found for:  PROLACTIN Lab Results  Component Value Date   CHOL 144 04/22/2013   TRIG 83 04/22/2013   HDL 44 04/22/2013   CHOLHDL 3.3 04/22/2013   VLDL 17 04/22/2013   LDLCALC 83 04/22/2013     Current Medications: Current Outpatient Medications  Medication Sig Dispense Refill  . famciclovir (FAMVIR) 250 MG tablet TAKE 1 TABLET(250 MG) BY MOUTH TWICE DAILY FOR ITCHING 60 tablet 1  . medroxyPROGESTERone (DEPO-PROVERA) 150 MG/ML injection INJECT 1 ML IN THE MUSCLE ONCE EVERY 3 MONTHS AS DIRECTED 1 mL 3  . methocarbamol (ROBAXIN) 500 MG tablet Take 1 tablet (500 mg total) by mouth every 8 (eight) hours as needed for muscle spasms. 30 tablet 5  . naratriptan (AMERGE) 1 MG TABS tablet Take 1 tablet (1 mg total) by mouth once as needed for up to 1 dose.  Take one (1) tablet at onset of headache DO NOT TAKE MORE than 3 tablets in a week 10 tablet 3  . OXTELLAR XR 600 MG TB24 Take 1,200 mg by mouth 2 (two) times daily. 120 tablet 6  . rizatriptan (MAXALT-MLT) 10 MG disintegrating tablet Take 1 tablet (10 mg total) by mouth as needed for migraine. May repeat in 2 hours if needed 10 tablet 5  . zonisamide (ZONEGRAN) 100 MG capsule Take 1 cap every night for 2 weeks, then increase to 2 caps every night for 2 weeks, then increase to 3 caps every night and continue 90 capsule 6   No current facility-administered medications for this visit.     Neurologic: Headache: No Seizure: No Paresthesias:No  Musculoskeletal: Strength & Muscle Tone: within normal limits Gait & Station: normal Patient leans: N/A  Psychiatric Specialty Exam: ROS  There were no vitals taken for this visit.There is no height or weight on file to calculate BMI.  General Appearance: Fairly Groomed  Eye Contact:  Good  Speech:  Clear and Coherent  Volume:  Normal  Mood:  {BHH MOOD:22306}  Affect:  {Affect (PAA):22687}  Thought Process:  Coherent  Orientation:  Full (Time, Place, and Person)  Thought Content:  Logical  Suicidal Thoughts:  {ST/HT (PAA):22692}  Homicidal Thoughts:  {ST/HT (PAA):22692}  Memory:  Immediate;   Good  Judgement:  {Judgement (PAA):22694}  Insight:  {Insight (PAA):22695}  Psychomotor Activity:  Normal  Concentration:  Concentration: Good and Attention Span: Good  Recall:  Good  Fund of Knowledge:Good  Language: Good  Akathisia:  No  Handed:  Right  AIMS (if indicated):  N/A  Assets:  Communication Skills Desire for Improvement  ADL's:  Intact  Cognition: WNL  Sleep:  ***   Assessment  Plan  The patient demonstrates the following risk factors for suicide: Chronic risk factors for suicide include: {Chronic Risk Factors for ZOXWRUE:45409811}Suicide:30414011}. Acute risk factors for suicide include: {Acute Risk Factors for BJYNWGN:56213086}Suicide:30414012}. Protective  factors for this patient include: {Protective Factors for Suicide VHQI:69629528}Risk:30414013}. Considering these factors, the overall suicide risk at this point appears to be {Desc; low/moderate/high:110033}. Patient {ACTION; IS/IS UXL:24401027}OT:21021397} appropriate for outpatient follow up.   Treatment Plan Summary: Plan as above   Neysa Hottereina Eleen Litz, MD 7/22/20193:22 PM

## 2018-02-10 ENCOUNTER — Encounter: Payer: Self-pay | Admitting: Family Medicine

## 2018-02-12 ENCOUNTER — Other Ambulatory Visit: Payer: Self-pay | Admitting: Adult Health

## 2018-02-12 ENCOUNTER — Ambulatory Visit (INDEPENDENT_AMBULATORY_CARE_PROVIDER_SITE_OTHER): Payer: Medicare Other | Admitting: *Deleted

## 2018-02-12 ENCOUNTER — Other Ambulatory Visit: Payer: Self-pay

## 2018-02-12 ENCOUNTER — Encounter: Payer: Self-pay | Admitting: *Deleted

## 2018-02-12 ENCOUNTER — Other Ambulatory Visit: Payer: Medicare Other

## 2018-02-12 DIAGNOSIS — Z3202 Encounter for pregnancy test, result negative: Secondary | ICD-10-CM

## 2018-02-12 DIAGNOSIS — N926 Irregular menstruation, unspecified: Secondary | ICD-10-CM

## 2018-02-12 DIAGNOSIS — Z3042 Encounter for surveillance of injectable contraceptive: Secondary | ICD-10-CM | POA: Diagnosis not present

## 2018-02-12 LAB — BETA HCG QUANT (REF LAB)

## 2018-02-12 LAB — POCT URINE PREGNANCY: PREG TEST UR: NEGATIVE

## 2018-02-12 MED ORDER — MEDROXYPROGESTERONE ACETATE 150 MG/ML IM SUSP
150.0000 mg | Freq: Once | INTRAMUSCULAR | Status: AC
  Administered 2018-02-12: 150 mg via INTRAMUSCULAR

## 2018-02-12 NOTE — Progress Notes (Signed)
Pt given DepoProvera 150mg IM right deltoid without complications. Advised to return in 12 weeks for next injection. 

## 2018-02-16 ENCOUNTER — Ambulatory Visit (HOSPITAL_COMMUNITY): Payer: Medicare Other | Admitting: Psychiatry

## 2018-02-17 ENCOUNTER — Ambulatory Visit: Payer: Medicare Other

## 2018-02-18 ENCOUNTER — Ambulatory Visit: Payer: Medicare Other | Admitting: Neurology

## 2018-02-19 ENCOUNTER — Encounter: Payer: Self-pay | Admitting: Family Medicine

## 2018-02-20 ENCOUNTER — Ambulatory Visit (INDEPENDENT_AMBULATORY_CARE_PROVIDER_SITE_OTHER): Payer: Medicare Other | Admitting: Neurology

## 2018-02-20 ENCOUNTER — Other Ambulatory Visit: Payer: Self-pay | Admitting: Family Medicine

## 2018-02-20 ENCOUNTER — Encounter: Payer: Self-pay | Admitting: Neurology

## 2018-02-20 ENCOUNTER — Other Ambulatory Visit: Payer: Self-pay

## 2018-02-20 VITALS — BP 120/74 | HR 76 | Ht 62.0 in | Wt 153.0 lb

## 2018-02-20 DIAGNOSIS — G40019 Localization-related (focal) (partial) idiopathic epilepsy and epileptic syndromes with seizures of localized onset, intractable, without status epilepticus: Secondary | ICD-10-CM | POA: Diagnosis not present

## 2018-02-20 MED ORDER — OXTELLAR XR 600 MG PO TB24: 1200.0000 mg | ORAL_TABLET | Freq: Two times a day (BID) | ORAL | 6 refills | Status: DC

## 2018-02-20 MED ORDER — ZONISAMIDE 100 MG PO CAPS: ORAL_CAPSULE | ORAL | 6 refills | Status: DC

## 2018-02-20 NOTE — Progress Notes (Signed)
NEUROLOGY FOLLOW UP OFFICE NOTE  Jeannetta NapDorothy M Hem [id number] [birth year]  HISTORY OF PRESENT ILLNESS: I had the pleasure of seeing Barrett HenleDorothy Zackery in follow-up in the neurology clinic on 02/20/2018.  The patient was last seen 3 months ago for intractable epilepsy and migraines. She is accompanied by her mother who helps supplement the history today.  Records and images were personally reviewed where available.  I personally reviewed MRI brain with and without contrast done 11/17/17 which did not show any acute changes, hippocampi symmetric with no abnormal signal or enhancement seen. There were several white matter changes bilaterally with no abnormal enhancement. [facility] shown bilateral temporal slowing, as well as rhythmic delta at F7 and F8, independent left and right temporal sharp waves. She was started on Zonisamide on her last visit, in addition to Oxtellar XR 1200mg  BID. No side effects on Zonisamide 300mg  qhs. She reports initially doing well in May and June with no seizures, then she had 3 seizures in July (July 2,16,23). She was under a lot of stress with the seizure on July 2. She has headaches after the seizures. She denies any dizziness, vision changes. No injuries from falls.   History from Initial Assessment 11/14/2017: This is a 37 year old right-handed woman with a history of migraines, back pain, and seizures, presenting to establish care. Seizures started around 2010 when she started having nocturnal convulsions with tongue bite and urinary incontinence. She would wake up with her children telling her what happened. She would be slightly confused but back to baseline quickly. She was on Keppra and Lamictal with no change in seizure frequency. At that time she was also drinking heavily, but even with decrease in alcohol intake, had not noticed any change in frequency. [facility] for 7 days in October 2012, typical episodes were not captured. EMU report  unavailable for review, on discharge summary it was noted there were incidental left-sided sharp waves of which there may or ma not be a correlation to her spells. Keppra stopped and Lamictal dose increased. She was seeing neurologist Dr. Gerilyn Pilgrimoonquah, records unavailable for review, but it appears Fycompa was added to Lamictal. She reported worsening of seizures, now occurring in wakefulness, and was admitted [facility], again typical events were not captured. Baseline EEG was abnormal with intermittent left anterior to midtemporal slow waves, sharply contoured bursts of theta slowing over the left temporal region, as well as intermittent right temporal theta and delta slowing. There were also frequent 3-4 Hz very brief intermediate duration fluctuating runs of rhythmic delta activities (LRDA) maximal at F7. Occasional very brief runs were also seen maximal at F8.There were occasional independent left temporal and right temporal sharp waves. At times left temporal discharges have a wide field. There are ER visits in April, May, October, November 2018 for seizures, medications listed included Vimpat, Fycompa, and Oxtellar. Her last ER visit was on 11/05/17, taking Oxtellar 1200mg  BID without side effects. She reports compliance to medication.  She brings a calendar of her seizures in 2019. She had 2 in January, 2 in February, 1 in March, 3 so far in April. The last seizure occurred on 11/11/17 out of sleep, she woke up with a lip bite. She has had a lot of her seizures occur [facility]. Family has witnessed the seizures, describing staring/unresponsiveness with lip smacking, then convulsions with head turn to the right. She denies  any prior warning symptoms, no post-ictal focal weakness. Her entire body feels sore. No clear seizure triggers, she denies any sleep deprivation or missed medications. She has significantly cut down on alcohol, drinking 1 beer  on the weekend.   She denies any olfactory/gustatory hallucinations, deja vu, rising epigastric sensation, focal numbness/tingling/weakness, myoclonic jerks. She has migraines after a seizure and had a prescription for Norco. She also has Zipsor and Robaxin to take after seizures. She denies any dizziness, diplopia, dysarthria/dysphagia, neck/back pain, bowel/bladder dysfunction.   Epilepsy Risk Factors:  Her brother had seizures in childhood. She had a normal birth and early development.  There is no history of febrile convulsions, CNS infections such as meningitis/encephalitis, significant traumatic brain injury, neurosurgical procedures, or family history of seizures. She has a scar over the left temporal region after hitting her head on a window, she denies any loss of consciousness with this.   Prior AEDs: Lamictal, Keppra, Fycompa, Vimpat Laboratory Data:  EEGs: as above MRI: MRI brain with and without contrast in 10/2008 was unremarkable with scatted white matter changes  PAST MEDICAL HISTORY: Past Medical History:  Diagnosis Date  . Back pain   . Chronic headaches   . Depression   . Genital warts    herpes  . Herpes   . Seizures (HCC) 03/15/2016    MEDICATIONS: Current Outpatient Medications on File Prior to Visit  Medication Sig Dispense Refill  . famciclovir (FAMVIR) 250 MG tablet TAKE 1 TABLET(250 MG) BY MOUTH TWICE DAILY FOR ITCHING 60 tablet 1  . medroxyPROGESTERone (DEPO-PROVERA) 150 MG/ML injection INJECT 1 ML IN THE MUSCLE ONCE EVERY 3 MONTHS AS DIRECTED 1 mL 3  . methocarbamol (ROBAXIN) 500 MG tablet Take 1 tablet (500 mg total) by mouth every 8 (eight) hours as needed for muscle spasms. 30 tablet 5  . naratriptan (AMERGE) 1 MG TABS tablet Take 1 tablet (1 mg total) by mouth once as needed for up to 1 dose. Take one (1) tablet at onset of headache DO NOT TAKE MORE than 3 tablets in a week 10 tablet 3  . OXTELLAR XR 600 MG TB24 Take 1,200 mg by mouth 2 (two) times  daily. 120 tablet 6  . rizatriptan (MAXALT-MLT) 10 MG disintegrating tablet Take 1 tablet (10 mg total) by mouth as needed for migraine. May repeat in 2 hours if needed 10 tablet 5  . zonisamide (ZONEGRAN) 100 MG capsule Take 1 cap every night for 2 weeks, then increase to 2 caps every night for 2 weeks, then increase to 3 caps every night and continue 90 capsule 6   No current facility-administered medications on file prior to visit.     ALLERGIES: Allergies  Allergen Reactions  . Morphine Other (See Comments)    Unknown Reaction     FAMILY HISTORY: Family History  Problem Relation Age of Onset  . Hypertension Mother     SOCIAL HISTORY: Social History   Socioeconomic History  . Marital status: Single    Spouse name: Not on file  . Number of children: 2  . Years of education: 21, GED  . Highest education level: Not on file  Occupational History    Comment: na  Social Needs  . Financial resource strain: Not on file  . Food insecurity:    Worry: Not on file    Inability: Not on file  . Transportation needs:    Medical: Not on file    Non-medical: Not on file  Tobacco Use  . Smoking  status: Current Every Day Smoker    Packs/day: 1.00    Years: 10.00    Pack years: 10.00    Types: Cigarettes  . Smokeless tobacco: Never Used  Substance and Sexual Activity  . Alcohol use: Yes    Alcohol/week: 1.2 oz    Types: 2 Cans of beer per week    Comment: occasionally  . Drug use: Yes    Types: Marijuana    Comment: 3 times a week, 08/05/2016 "not often"  . Sexual activity: Not Currently    Birth control/protection: Injection  Lifestyle  . Physical activity:    Days per week: Not on file    Minutes per session: Not on file  . Stress: Not on file  Relationships  . Social connections:    Talks on phone: Not on file    Gets together: Not on file    Attends religious service: Not on file    Active member of club or organization: Not on file    Attends meetings of clubs or  organizations: Not on file    Relationship status: Not on file  . Intimate partner violence:    Fear of current or ex partner: Not on file    Emotionally abused: Not on file    Physically abused: Not on file    Forced sexual activity: Not on file  Other Topics Concern  . Not on file  Social History Narrative   Lives  With 2 children in home   Caffeine -Pepsi 2 daily    REVIEW OF SYSTEMS: Constitutional: No fevers, chills, or sweats, no generalized fatigue, change in appetite Eyes: No visual changes, double vision, eye pain Ear, nose and throat: No hearing loss, ear pain, nasal congestion, sore throat Cardiovascular: No chest pain, palpitations Respiratory:  No shortness of breath at rest or with exertion, wheezes GastrointestinaI: No nausea, vomiting, diarrhea, abdominal pain, fecal incontinence Genitourinary:  No dysuria, urinary retention or frequency Musculoskeletal:  No neck pain, back pain Integumentary: No rash, pruritus, skin lesions Neurological: as above Psychiatric: No depression, insomnia, anxiety Endocrine: No palpitations, fatigue, diaphoresis, mood swings, change in appetite, change in weight, increased thirst Hematologic/Lymphatic:  No anemia, purpura, petechiae. Allergic/Immunologic: no itchy/runny eyes, nasal congestion, recent allergic reactions, rashes  PHYSICAL EXAM: Vitals:   02/20/18 1011  BP: 120/74  Pulse: 76  SpO2: 99%   General: No acute distress Head:  Normocephalic/atraumatic Neck: supple, no paraspinal tenderness, full range of motion Heart:  Regular rate and rhythm Lungs:  Clear to auscultation bilaterally Back: No paraspinal tenderness Skin/Extremities: No rash, no edema Neurological Exam: alert and oriented to person, place, and time. No aphasia or dysarthria. Fund of knowledge is appropriate.  Recent and remote memory are intact.  Attention and concentration are normal.    Able to name objects and repeat phrases. Cranial nerves: Pupils  equal, round, reactive to light.  Extraocular movements intact with no nystagmus. Visual fields full. Facial sensation intact. No facial asymmetry. Tongue, uvula, palate midline.  Motor: Bulk and tone normal, muscle strength 5/5 throughout with no pronator drift.  Sensation to light touch intact.  No extinction to double simultaneous stimulation.  Deep tendon reflexes 2+ throughout, toes downgoing.  Finger to nose testing intact.  Gait narrow-based and steady, able to tandem walk adequately.  Romberg negative.  IMPRESSION: This is a 37 yo RH woman with a history of migraines and seizures since 2010, suggestive of intractable focal to bilateral tonic-clonic epilepsy arising from the left temporal region (staring/unresponsive  with lip smacking followed by convulsion with head version to the right per family). She has had 2 prior 7-day EMU admissions where typical events were not captured, however most recent baseline EEG showed bilateral temporal sharp waves and focal slowing. MRI brain no acute changes seen. She continues to report seizures on 2 AEDs, increase Zonisamide to 400mg  qhs. She will call our office for an update in 2 months, we may further uptitrate Zonisamide or add on a third AED, however again discussed intractable epilepsy and consideration for presurgical evaluation. She does not drive and is aware of [place] driving laws to stop driving after a seizure, until 6 months seizure-free. She will continue her seizure calendar and follow-up in 4 months.   Thank you for allowing me to participate in her care.  Please do not hesitate to call for any questions or concerns.  The duration of this appointment visit was 25 minutes of face-to-face time with the patient.  Greater than 50% of this time was spent in counseling, explanation of diagnosis, planning of further management, and coordination of care.   Patrcia Dolly, M.D.   CC: Dr. Lodema Hong

## 2018-02-20 NOTE — Patient Instructions (Addendum)
1. Increase Zonisamide 100mg : Take 4 caps every night 2. Continue Oxtellar XR 600mg : Take 2 tabs twice a day 3. Call our office for an update in 2 months, then follow-up in 4 months, call for any changes  Seizure Precautions: 1. If medication has been prescribed for you to prevent seizures, take it exactly as directed.  Do not stop taking the medicine without talking to your doctor first, even if you have not had a seizure in a long time.   2. Avoid activities in which a seizure would cause danger to yourself or to others.  Don't operate dangerous machinery, swim alone, or climb in high or dangerous places, such as on ladders, roofs, or girders.  Do not drive unless your doctor says you may.  3. If you have any warning that you may have a seizure, lay down in a safe place where you can't hurt yourself.    4.  No driving for 6 months from last seizure, as per [facility] state law.   Please refer to the following link on the [employer]ile[employer] of [employer]'s website for more information: http://www.epilepsyfoundation.org/answerplace/Social/driving/drivingu.cfm   5.  Maintain good sleep hygiene. Avoid alcohol.  6.  Notify your neurology if you are planning pregnancy or if you become pregnant.  7.  Contact your doctor if you have any problems that may be related to the medicine you are taking.  8.  Call 911 and bring the patient back to the ED if:        A.  The seizure lasts longer than 5 minutes.       B.  The patient doesn't awaken shortly after the seizure  C.  The patient has new problems such as difficulty seeing, speaking or moving  D.  The patient was injured during the seizure  E.  The patient has a temperature over 102 F (39C)  F.  The patient vomited and now is having trouble breathing

## 2018-02-25 ENCOUNTER — Ambulatory Visit: Payer: Medicare Other | Admitting: Family Medicine

## 2018-02-27 ENCOUNTER — Encounter: Payer: Self-pay | Admitting: Family Medicine

## 2018-03-05 ENCOUNTER — Encounter (HOSPITAL_COMMUNITY): Payer: Self-pay | Admitting: Emergency Medicine

## 2018-03-05 ENCOUNTER — Other Ambulatory Visit: Payer: Self-pay

## 2018-03-05 ENCOUNTER — Emergency Department (HOSPITAL_COMMUNITY)
Admission: EM | Admit: 2018-03-05 | Discharge: 2018-03-05 | Disposition: A | Discharge status: Home / Self Care | Payer: Medicare Other | Attending: Emergency Medicine | Admitting: Emergency Medicine

## 2018-03-05 DIAGNOSIS — R569 Unspecified convulsions: Secondary | ICD-10-CM | POA: Insufficient documentation

## 2018-03-05 DIAGNOSIS — Z5321 Procedure and treatment not carried out due to patient leaving prior to being seen by health care provider: Secondary | ICD-10-CM | POA: Insufficient documentation

## 2018-03-05 LAB — COMPREHENSIVE METABOLIC PANEL
ALK PHOS: 87 U/L (ref 38–126)
ALT: 10 U/L (ref 0–44)
ANION GAP: 7 (ref 5–15)
AST: 10 U/L — ABNORMAL LOW (ref 15–41)
Albumin: 3.5 g/dL (ref 3.5–5.0)
BILIRUBIN TOTAL: 0.5 mg/dL (ref 0.3–1.2)
BUN: 8 mg/dL (ref 6–20)
CALCIUM: 8.4 mg/dL — AB (ref 8.9–10.3)
CO2: 18 mmol/L — ABNORMAL LOW (ref 22–32)
Chloride: 110 mmol/L (ref 98–111)
Creatinine, Ser: 0.86 mg/dL (ref 0.44–1.00)
GFR calc non Af Amer: >60 mL/min (ref >60)
Glucose, Bld: 97 mg/dL (ref 70–99)
Potassium: 3.9 mmol/L (ref 3.5–5.1)
Sodium: 135 mmol/L (ref 135–145)
TOTAL PROTEIN: 6.7 g/dL (ref 6.5–8.1)

## 2018-03-05 LAB — CBC WITH DIFFERENTIAL/PLATELET
Basophils Absolute: 0 10*3/uL (ref 0.0–0.1)
Basophils Relative: 0 %
Eosinophils Absolute: 0.1 10*3/uL (ref 0.0–0.7)
Eosinophils Relative: 2 %
HEMATOCRIT: 39.5 % (ref 36.0–46.0)
HEMOGLOBIN: 13 g/dL (ref 12.0–15.0)
LYMPHS ABS: 2.5 10*3/uL (ref 0.7–4.0)
Lymphocytes Relative: 46 %
MCH: 31.5 pg (ref 26.0–34.0)
MCHC: 32.9 g/dL (ref 30.0–36.0)
MCV: 95.6 fL (ref 78.0–100.0)
MONO ABS: 0.4 10*3/uL (ref 0.1–1.0)
MONOS PCT: 8 %
NEUTROS PCT: 44 %
Neutro Abs: 2.4 10*3/uL (ref 1.7–7.7)
Platelets: 346 10*3/uL (ref 150–400)
RBC: 4.13 MIL/uL (ref 3.87–5.11)
RDW: 15.1 % (ref 11.5–15.5)
WBC: 5.5 10*3/uL (ref 4.0–10.5)

## 2018-03-05 NOTE — ED Provider Notes (Signed)
23:10 PM Pt not in room, gown and monitor leads laying on stretcher.    Devoria AlbeKnapp, Lorra Freeman, MD 03/05/18 [phone]

## 2018-03-05 NOTE — ED Notes (Signed)
Pt not in room upon entrance. Gown on bed.

## 2018-03-05 NOTE — ED Triage Notes (Addendum)
Per family pt had seizure like activity in car. Pt does not remember what happened. Pt very slow to respond to questions.

## 2018-03-22 ENCOUNTER — Emergency Department (HOSPITAL_COMMUNITY): Payer: Medicare Other

## 2018-03-22 ENCOUNTER — Encounter (HOSPITAL_COMMUNITY): Payer: Self-pay | Admitting: *Deleted

## 2018-03-22 ENCOUNTER — Emergency Department (HOSPITAL_COMMUNITY)
Admission: EM | Admit: 2018-03-22 | Discharge: 2018-03-22 | Disposition: A | Discharge status: Home / Self Care | Payer: Medicare Other | Attending: Emergency Medicine | Admitting: Emergency Medicine

## 2018-03-22 ENCOUNTER — Other Ambulatory Visit: Payer: Self-pay

## 2018-03-22 DIAGNOSIS — F17210 Nicotine dependence, cigarettes, uncomplicated: Secondary | ICD-10-CM | POA: Diagnosis not present

## 2018-03-22 DIAGNOSIS — S0993XA Unspecified injury of face, initial encounter: Secondary | ICD-10-CM | POA: Diagnosis not present

## 2018-03-22 DIAGNOSIS — S0990XA Unspecified injury of head, initial encounter: Secondary | ICD-10-CM | POA: Diagnosis not present

## 2018-03-22 DIAGNOSIS — W19XXXA Unspecified fall, initial encounter: Secondary | ICD-10-CM | POA: Insufficient documentation

## 2018-03-22 DIAGNOSIS — Y999 Unspecified external cause status: Secondary | ICD-10-CM | POA: Insufficient documentation

## 2018-03-22 DIAGNOSIS — G40909 Epilepsy, unspecified, not intractable, without status epilepticus: Secondary | ICD-10-CM | POA: Diagnosis not present

## 2018-03-22 DIAGNOSIS — Z79899 Other long term (current) drug therapy: Secondary | ICD-10-CM | POA: Insufficient documentation

## 2018-03-22 DIAGNOSIS — R220 Localized swelling, mass and lump, head: Secondary | ICD-10-CM | POA: Diagnosis not present

## 2018-03-22 DIAGNOSIS — Y93E5 Activity, floor mopping and cleaning: Secondary | ICD-10-CM | POA: Insufficient documentation

## 2018-03-22 DIAGNOSIS — Y92002 Bathroom of unspecified non-institutional (private) residence single-family (private) house as the place of occurrence of the external cause: Secondary | ICD-10-CM | POA: Diagnosis not present

## 2018-03-22 DIAGNOSIS — S0083XA Contusion of other part of head, initial encounter: Secondary | ICD-10-CM | POA: Diagnosis not present

## 2018-03-22 DIAGNOSIS — R51 Headache: Secondary | ICD-10-CM | POA: Diagnosis not present

## 2018-03-22 LAB — PREGNANCY, URINE: Preg Test, Ur: NEGATIVE

## 2018-03-22 NOTE — ED Notes (Signed)
Pt reports a new neurologist  changed doctors because she reports she was having "back to back" seizures new neuro in [place]   meds were changed, now with 3-4 seizures in the last month   Today, seizure and fell in the bathroom striking her L face Home, and ok and friends told her to come to hospital because she could have a brain bleed

## 2018-03-22 NOTE — ED Notes (Signed)
To CT

## 2018-03-22 NOTE — ED Provider Notes (Signed)
[facility] EMERGENCY DEPARTMENT Provider Note   CSN: [id number] Arrival date & time: 03/22/18  2111     History   Chief Complaint Chief Complaint  Patient presents with  . Fall    HPI [name] is a 37 y.o. female.  Patient with history of seizures and had breakthrough seizure this afternoon and hit the side of her head.  Patient has been back to her baseline for several hours but has bruising to the left side of her face and was concerned about a fracture or bleeding on her brain.  Patient has no headaches, no shortness of breath, no chest pain.  Has not taken her nighttime seizure medication at this time but did take her morning medication.  Denies any fever, chills.  The history is provided by the patient.  Fall  This is a new problem. The current episode started 3 to 5 hours ago. The problem occurs rarely. The problem has been resolved. Pertinent negatives include no chest pain, no abdominal pain, no headaches and no shortness of breath. Nothing aggravates the symptoms. Nothing relieves the symptoms. She has tried nothing for the symptoms. The treatment provided no relief.    Past Medical History:  Diagnosis Date  . Back pain   . Chronic headaches   . Depression   . Genital warts    herpes  . Herpes   . Seizures (HCC) 03/15/2016    Patient Active Problem List   Diagnosis Date Noted  . 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 Status epilepticus, generalized convulsive (HCC) 11/20/2015  . Seizure (HCC) 11/19/2015  . Amenorrhea 09/28/2011  . Vitamin D deficiency 09/06/2011  . Headache 03/10/2011  . Seizures (HCC) 10/26/2008  . NICOTINE ADDICTION 12/10/2007  . Depression 12/10/2007    Past Surgical History:  Procedure Laterality Date  . CESAREAN SECTION     x 3     OB History    Gravida  3   Para  3   Term      Preterm      AB      Living  3     SAB      TAB      Ectopic      Multiple      Live Births  3            Home Medications    Prior to Admission medications   Medication Sig Start Date End Date Taking? Authorizing Provider  famciclovir (FAMVIR) 250 MG tablet TAKE 1 TABLET(250 MG) BY MOUTH TWICE DAILY FOR ITCHING Patient taking differently: Take 250 mg by mouth 2 (two) times daily.  02/23/18  Yes Kerri Perches, MD  medroxyPROGESTERone (DEPO-PROVERA) 150 MG/ML injection INJECT 1 ML IN THE MUSCLE ONCE EVERY 3 MONTHS AS DIRECTED Patient taking differently: Inject 150 mg into the muscle every 3 (three) months.  10/22/17  Yes Adline Potter, NP  methocarbamol (ROBAXIN) 500 MG tablet Take 1 tablet (500 mg total) by mouth every 8 (eight) hours as needed for muscle spasms. 11/14/17  Yes Van Clines, MD  naratriptan (AMERGE) 1 MG TABS tablet Take 1 tablet (1 mg total) by mouth once as needed for up to 1 dose. Take one (1) tablet at onset of headache DO NOT TAKE MORE than 3 tablets in a week 12/05/17  Yes Van Clines, MD  OXTELLAR XR 600 MG TB24 Take 1,200  mg by mouth 2 (two) times daily. 02/20/18  Yes Van Clines, MD  zonisamide (ZONEGRAN) 100 MG capsule Take 4 caps every night Patient taking differently: Take 400 mg by mouth every evening. Take 4 caps every night 02/20/18  Yes Van Clines, MD    Family History Family History  Problem Relation Age of Onset  . Hypertension Mother     Social History Social History   Tobacco Use  . Smoking status: Current Every Day Smoker    Packs/day: 1.00    Years: 10.00    Pack years: 10.00    Types: Cigarettes  . Smokeless tobacco: Never Used  Substance Use Topics  . Alcohol use: Yes    Alcohol/week: 2.0 standard drinks    Types: 2 Cans of beer per week    Comment: occasionally  . Drug use: Yes    Types: Marijuana    Comment: 3 times a week, 08/05/2016 "not often"     Allergies   Morphine   Review of  Systems Review of Systems  Constitutional: Negative for chills and fever.  HENT: Negative for ear pain and sore throat.   Eyes: Negative for pain and visual disturbance.  Respiratory: Negative for cough and shortness of breath.   Cardiovascular: Negative for chest pain and palpitations.  Gastrointestinal: Negative for abdominal pain and vomiting.  Genitourinary: Negative for dysuria and hematuria.  Musculoskeletal: Negative for arthralgias and back pain.  Skin: Negative for color change and rash.  Neurological: Positive for seizures. Negative for syncope and headaches.  All other systems reviewed and are negative.    Physical Exam Updated Vital Signs  ED Triage Vitals  Enc Vitals Group     BP 03/22/18 2118 (!) 133/97     Pulse Rate 03/22/18 2118 88     Resp 03/22/18 2118 18     Temp 03/22/18 2118 99 F (37.2 C)     Temp Source 03/22/18 2118 Oral     SpO2 03/22/18 2118 100 %     Weight 03/22/18 2121 140 lb (63.5 kg)     Height 03/22/18 2121 5\' 2"  (1.575 m)     Head Circumference --      Peak Flow --      Pain Score 03/22/18 2121 8     Pain Loc --      Pain Edu? --      Excl. in GC? --     Physical Exam  Constitutional: She is oriented to person, place, and time. She appears well-developed and well-nourished. No distress.  HENT:  Head: Normocephalic and atraumatic.  Eyes: Pupils are equal, round, and reactive to light. Conjunctivae and EOM are normal.  Neck: Normal range of motion. Neck supple.  No midline spinal tenderness  Cardiovascular: Normal rate and regular rhythm.  No murmur heard. Pulmonary/Chest: Effort normal and breath sounds normal. No respiratory distress.  Abdominal: Soft. There is no tenderness.  Musculoskeletal: Normal range of motion. She exhibits tenderness (TTP over the left side of her face). She exhibits no edema.  No midline spinal pain  Neurological: She is alert and oriented to person, place, and time. No cranial nerve deficit or sensory  deficit. She exhibits normal muscle tone. Coordination normal.  5+/5 strength, normal sensation, no drift, normal gait  Skin: Skin is warm and dry.  Psychiatric: She has a normal mood and affect.  Nursing note and vitals reviewed.    ED Treatments / Results  Labs (all labs ordered are listed, but only  abnormal results are displayed) Labs Reviewed  PREGNANCY, URINE    EKG None  Radiology Ct Head Wo Contrast  Result Date: 03/22/2018 CLINICAL DATA:  Patient believes she had a seizure hitting head on the bathroom. Left-sided facial pain and headache. EXAM: CT HEAD WITHOUT CONTRAST CT MAXILLOFACIAL WITHOUT CONTRAST TECHNIQUE: Multidetector CT imaging of the head and maxillofacial structures were performed using the standard protocol without intravenous contrast. Multiplanar CT image reconstructions of the maxillofacial structures were also generated. COMPARISON:  None. FINDINGS: CT HEAD FINDINGS BRAIN: The ventricles and sulci are normal. No intraparenchymal hemorrhage, mass effect nor midline shift. No acute large vascular territory infarcts. Grey-white matter distinction is maintained. The basal ganglia are unremarkable. No abnormal extra-axial fluid collections. Basal cisterns are not effaced and midline. The brainstem and cerebellar hemispheres are without acute abnormalities. VASCULAR: Unremarkable. SKULL/SOFT TISSUES: No skull fracture. No significant soft tissue swelling. ORBITS/SINUSES: The included ocular globes and orbital contents are normal.The mastoid air cells are clear. The included paranasal sinuses are well-aerated. OTHER: None. CT MAXILLOFACIAL FINDINGS Osseous: No fracture or mandibular dislocation. No destructive process. Orbits: Negative. No traumatic or inflammatory finding. Sinuses: The frontal sinus is developmentally not pneumatized. No air-fluid levels or significant mucosal thickening of the paranasal sinuses. No fracture the sinus walls. Soft tissues: Mild soft tissue  swelling overlying the left zygoma. IMPRESSION: No acute intracranial abnormality. Left facial soft tissue swelling overlying the zygoma. No acute maxillofacial fracture. Electronically Signed   By: Tollie Eth M.D.   On: 03/22/2018 22:46   Ct Maxillofacial Wo Contrast  Result Date: 03/22/2018 CLINICAL DATA:  Patient believes she had a seizure hitting head on the bathroom. Left-sided facial pain and headache. EXAM: CT HEAD WITHOUT CONTRAST CT MAXILLOFACIAL WITHOUT CONTRAST TECHNIQUE: Multidetector CT imaging of the head and maxillofacial structures were performed using the standard protocol without intravenous contrast. Multiplanar CT image reconstructions of the maxillofacial structures were also generated. COMPARISON:  None. FINDINGS: CT HEAD FINDINGS BRAIN: The ventricles and sulci are normal. No intraparenchymal hemorrhage, mass effect nor midline shift. No acute large vascular territory infarcts. Grey-white matter distinction is maintained. The basal ganglia are unremarkable. No abnormal extra-axial fluid collections. Basal cisterns are not effaced and midline. The brainstem and cerebellar hemispheres are without acute abnormalities. VASCULAR: Unremarkable. SKULL/SOFT TISSUES: No skull fracture. No significant soft tissue swelling. ORBITS/SINUSES: The included ocular globes and orbital contents are normal.The mastoid air cells are clear. The included paranasal sinuses are well-aerated. OTHER: None. CT MAXILLOFACIAL FINDINGS Osseous: No fracture or mandibular dislocation. No destructive process. Orbits: Negative. No traumatic or inflammatory finding. Sinuses: The frontal sinus is developmentally not pneumatized. No air-fluid levels or significant mucosal thickening of the paranasal sinuses. No fracture the sinus walls. Soft tissues: Mild soft tissue swelling overlying the left zygoma. IMPRESSION: No acute intracranial abnormality. Left facial soft tissue swelling overlying the zygoma. No acute maxillofacial  fracture. Electronically Signed   By: Tollie Eth M.D.   On: 03/22/2018 22:46    Procedures Procedures (including critical care time)  Medications Ordered in ED Medications - No data to display   Initial Impression / Assessment and Plan / ED Course  I have reviewed the triage vital signs and the nursing notes.  Pertinent labs & imaging results that were available during my care of the patient were reviewed by me and considered in my medical decision making (see chart for details).     SHERETTA GRUMBINE is a 37 year old female with history of seizures who presents to the  ED following seizure.  Patient with no fever.  No abnormal vitals.  Patient with seizure this afternoon in which she had a left side of her face.  Patient has been at her baseline for the last several hours but came to the ED as she has had some bruising to the left side of her face.  She is concerned about fracture or possibly bleeding.  Patient states she is not always compliant with her medication but did pick up new prescription today.  Patient is neurologically intact.  Has no midline spinal pain.  Is overall well-appearing.  CT of the head and of the face were unremarkable.  Patient encouraged to continue to take her seizure medication and recommend follow-up with primary neurologist.  Told her return to the ED if symptoms worsen.  Final Clinical Impressions(s) / ED Diagnoses   Final diagnoses:  Contusion of face, initial encounter    ED Discharge Orders    None       Virgina Norfolk, DO 03/22/18 2301

## 2018-03-22 NOTE — ED Triage Notes (Signed)
Pt states that she was cleaning her bathroom and then the next thing she knows was that the ambulance crew were in her bathroom, pt reports that she thinks that she had a seizure, hitting her head in the bathroom, pt alert, able to answer questions, EDP at bedside,

## 2018-04-21 ENCOUNTER — Telehealth: Payer: Self-pay | Admitting: Neurology

## 2018-04-21 NOTE — Telephone Encounter (Signed)
Does she think the dizziness started after she fell last month or after we increased the zonisamide several months back? If it's with zonisamide, reduce back to 300mg  qhs. If since the fall, is she having more headaches also? If yes, start nortriptyline 10mg  qhs to help with headaches and potentially dizziness from head injury. Thanks

## 2018-04-21 NOTE — Telephone Encounter (Signed)
Spoke with pt.  She states that she has had 3 seizures since increasing her Zonisamide.  Verified how she was taking it - 4 Tabs QHS.  Pt states that her last seizure was on 03/22/18, she was brought to ED via EMS and was told that EMS would contact our office about her recent seizures.  Pt assumed we were already contacted, and did not think to call us.  Pt goes on to say that she is experiencing dizzy spells.  When asked how often she states "all the time".  Advised I would send message to DR. Aquino and return call with any recommendations.  Pt states that she would like to be seen on Thursday, since she is going to be in [place] anyway.  Let her know that Dr. Karel Jarvis has no openings for this Thursday.  Dr. Karel Jarvis - pls advise on seizure activity and dizziness

## 2018-04-21 NOTE — Telephone Encounter (Signed)
Patient is calling in wanting to know if there is any medication she could take for her dizziness. She also wanted to know that she has had 3 seizures since she upped the dosage of the Zonisamide 100mg  medication. Please call her back at [phone]. Thanks!

## 2018-04-22 MED ORDER — NORTRIPTYLINE HCL 10 MG PO CAPS: 10.0000 mg | ORAL_CAPSULE | Freq: Every day | ORAL | 3 refills | Status: DC

## 2018-04-22 NOTE — Telephone Encounter (Signed)
Spoke with pt.  She states that the dizzy spells have been going on since prior to her fall, however they have gotten worse since the fall.  States that she is also experiencing headaches.   Instructed pt to reduce Zonisamide to 300mg  QHS and that I will send Rx for Nortriptyline.  Verified pt's preferred pharmacy.   Rx for Nortriptyline 10mg s #30 with 3 refills  Sig = 1 tablet QHS  Sent to [facility] on [address] in [place], [place]

## 2018-05-06 ENCOUNTER — Other Ambulatory Visit: Payer: Self-pay | Admitting: Family Medicine

## 2018-05-07 ENCOUNTER — Encounter: Payer: Self-pay | Admitting: *Deleted

## 2018-05-07 ENCOUNTER — Ambulatory Visit (INDEPENDENT_AMBULATORY_CARE_PROVIDER_SITE_OTHER): Payer: Medicare Other | Admitting: *Deleted

## 2018-05-07 ENCOUNTER — Other Ambulatory Visit: Payer: Self-pay

## 2018-05-07 DIAGNOSIS — Z3202 Encounter for pregnancy test, result negative: Secondary | ICD-10-CM | POA: Diagnosis not present

## 2018-05-07 DIAGNOSIS — Z3042 Encounter for surveillance of injectable contraceptive: Secondary | ICD-10-CM | POA: Diagnosis not present

## 2018-05-07 LAB — POCT URINE PREGNANCY: Preg Test, Ur: NEGATIVE

## 2018-05-07 MED ORDER — MEDROXYPROGESTERONE ACETATE 150 MG/ML IM SUSP
150.0000 mg | Freq: Once | INTRAMUSCULAR | Status: AC
  Administered 2018-05-07: 150 mg via INTRAMUSCULAR

## 2018-05-07 NOTE — Progress Notes (Signed)
Pt given DepoProvera 150mg  IM left deltoid without complications. Advised to return in 12 weeks for next injection. Advised to also make an appt for well woman exam as she has not had one in over 2 years. Pt verbalized understanding.

## 2018-05-12 ENCOUNTER — Ambulatory Visit: Payer: Medicare Other

## 2018-06-09 ENCOUNTER — Encounter: Payer: Self-pay | Admitting: *Deleted

## 2018-06-15 ENCOUNTER — Telehealth: Payer: Self-pay | Admitting: Neurology

## 2018-06-15 NOTE — Telephone Encounter (Signed)
Patient called regarding a letter that will be sent or has been faxed needing a signature from Dr. Karel JarvisAquino. She is needing the letter to have someone to sit with her during the day while her Daughter is at school.

## 2018-06-22 ENCOUNTER — Encounter: Payer: Self-pay | Admitting: Neurology

## 2018-06-22 ENCOUNTER — Other Ambulatory Visit: Payer: Self-pay

## 2018-06-22 ENCOUNTER — Ambulatory Visit (INDEPENDENT_AMBULATORY_CARE_PROVIDER_SITE_OTHER): Payer: Medicare Other | Admitting: Neurology

## 2018-06-22 VITALS — BP 126/68 | HR 73 | Ht 61.0 in | Wt 156.0 lb

## 2018-06-22 DIAGNOSIS — G40019 Localization-related (focal) (partial) idiopathic epilepsy and epileptic syndromes with seizures of localized onset, intractable, without status epilepticus: Secondary | ICD-10-CM

## 2018-06-22 MED ORDER — DIVALPROEX SODIUM ER 250 MG PO TB24: ORAL_TABLET | ORAL | 6 refills | Status: DC

## 2018-06-22 MED ORDER — OXTELLAR XR 600 MG PO TB24: ORAL_TABLET | ORAL | 6 refills | Status: DC

## 2018-06-22 MED ORDER — NORTRIPTYLINE HCL 10 MG PO CAPS: 10.0000 mg | ORAL_CAPSULE | Freq: Every day | ORAL | 3 refills | Status: DC

## 2018-06-22 MED ORDER — ZONISAMIDE 100 MG PO CAPS: ORAL_CAPSULE | ORAL | 6 refills | Status: DC

## 2018-06-22 NOTE — Patient Instructions (Signed)
1. Reduce Oxtellar XR 600mg : take 1 tablet in AM, 2 tablets in PM 2. Continue Zonisamide 100mg : take 3 capsules every night 3. Start Depakote ER 250mg : take 1 tablet every night 4. If seizures continue on 3 medications, we will have to start thinking of other options to help with the seizures, such as if surgery is an option 5. Follow-up in 3-4 months, call for any changes  Seizure Precautions: 1. If medication has been prescribed for you to prevent seizures, take it exactly as directed.  Do not stop taking the medicine without talking to your doctor first, even if you have not had a seizure in a long time.   2. Avoid activities in which a seizure would cause danger to yourself or to others.  Don't operate dangerous machinery, swim alone, or climb in high or dangerous places, such as on ladders, roofs, or girders.  Do not drive unless your doctor says you may.  3. If you have any warning that you may have a seizure, lay down in a safe place where you can't hurt yourself.    4.  No driving for 6 months from last seizure, as per [facility] law.   Please refer to the following link on the Epile[employer] of [employer]'s website for more information: http://www.epilepsyfoundation.org/answerplace/Social/driving/drivingu.cfm   5.  Maintain good sleep hygiene. Avoid alcohol.  6.  Notify your neurology if you are planning pregnancy or if you become pregnant.  7.  Contact your doctor if you have any problems that may be related to the medicine you are taking.  8.  Call 911 and bring the patient back to the ED if:        A.  The seizure lasts longer than 5 minutes.       B.  The patient doesn't awaken shortly after the seizure  C.  The patient has new problems such as difficulty seeing, speaking or moving  D.  The patient was injured during the seizure  E.  The patient has a temperature over 102 F (39C)  F.  The patient vomited and now is having trouble breathing

## 2018-06-22 NOTE — Progress Notes (Signed)
NEUROLOGY FOLLOW UP OFFICE NOTE  AHLAYA Levine [id number] July 03, 1981  HISTORY OF PRESENT ILLNESS: I had the pleasure of seeing [name] in follow-up in the neurology clinic on 06/22/2018.  The patient was last seen 4 months ago for intractable epilepsy and migraines. She is again accompanied by her mother and daughter who help supplement the history today. They are all poor historians, but report continued seizures since her last visit. She is taking Oxtellar 600mg  2 tabs BID (1200mg  BID) and Zonisamide 300mg  qhs. She reported dizziness on 400mg  dose, but continues to report dizziness, which she feels is more in the morning with double vision as well. She called our office in October to report 3 seizures since last visit, she was in the ER on 03/22/18 after a seizure while cleaning the toilet, her daughter found her having a convulsion, she was combative when EMS arrived. ER notes indicate she was not fully compliant with medications, but she states she takes them regularly. She recalls one seizure in October, none in November. She was reporting headaches and dizziness and was started on nortriptyline 10mg  qhs. She reports the headaches are better, occurring around once a week.  History from Initial Assessment 11/14/2017: This is a 37 year old right-handed woman with a history of migraines, back pain, and seizures, presenting to establish care. Seizures started around 2010 when she started having nocturnal convulsions with tongue bite and urinary incontinence. She would wake up with her children telling her what happened. She would be slightly confused but back to baseline quickly. She was on Keppra and Lamictal with no change in seizure frequency. At that time she was also drinking heavily, but even with decrease in alcohol intake, had not noticed any change in frequency. She was admitted to [facility] for 7 days in October 2012, typical episodes were not captured. EMU report unavailable for  review, on discharge summary it was noted there were incidental left-sided sharp waves of which there may or ma not be a correlation to her spells. Keppra stopped and Lamictal dose increased. She was seeing neurologist Dr. Gerilyn Pilgrim, records unavailable for review, but it appears Fycompa was added to Lamictal. She reported worsening of seizures, now occurring in wakefulness, and was admitted for another 7-day EMU stay at [facility] in January 2018, again typical events were not captured. Baseline EEG was abnormal with intermittent left anterior to midtemporal slow waves, sharply contoured bursts of theta slowing over the left temporal region, as well as intermittent right temporal theta and delta slowing. There were also frequent 3-4 Hz very brief intermediate duration fluctuating runs of rhythmic delta activities (LRDA) maximal at F7. Occasional very brief runs were also seen maximal at F8.There were occasional independent left temporal and right temporal sharp waves. At times left temporal discharges have a wide field. There are ER visits in April, May, October, November 2018 for seizures, medications listed included Vimpat, Fycompa, and Oxtellar. Her last ER visit was on 11/05/17, taking Oxtellar 1200mg  BID without side effects. She reports compliance to medication.  She brings a calendar of her seizures in 2019. She had 2 in January, 2 in February, 1 in March, 3 so far in April. The last seizure occurred on 11/11/17 out of sleep, she woke up with a lip bite. She has had a lot of her seizures occur while playing at the [facility] [employer]. Family has witnessed the seizures, describing staring/unresponsiveness with lip smacking, then convulsions with head turn to the right. She denies any prior warning  symptoms, no post-ictal focal weakness. Her entire body feels sore. No clear seizure triggers, she denies any sleep deprivation or missed medications. She has significantly cut down on alcohol, drinking 1 beer on the weekend.     She denies any olfactory/gustatory hallucinations, deja vu, rising epigastric sensation, focal numbness/tingling/weakness, myoclonic jerks. She has migraines after a seizure and had a prescription for Norco. She also has Zipsor and Robaxin to take after seizures. She denies any dizziness, diplopia, dysarthria/dysphagia, neck/back pain, bowel/bladder dysfunction.   Epilepsy Risk Factors:  Her brother had seizures in childhood. She had a normal birth and early development.  There is no history of febrile convulsions, CNS infections such as meningitis/encephalitis, significant traumatic brain injury, neurosurgical procedures, or family history of seizures. She has a scar over the left temporal region after hitting her head on a window, she denies any loss of consciousness with this.   Prior AEDs: Lamictal, Keppra, Fycompa, Vimpat Laboratory Data:  EEGs: Her EEG at [facility] had shown bilateral temporal slowing, as well as rhythmic delta at F7 and F8, independent left and right temporal sharp waves. MRI: MRI brain with and without contrast in 10/2008 was unremarkable with scatted white matter changes I personally reviewed MRI brain with and without contrast done 11/17/17 which did not show any acute changes, hippocampi symmetric with no abnormal signal or enhancement seen. There were several white matter changes bilaterally with no abnormal enhancement.  PAST MEDICAL HISTORY: Past Medical History:  Diagnosis Date  . Back pain   . Chronic headaches   . Depression   . Genital warts    herpes  . Herpes   . Seizures (HCC) 03/15/2016    MEDICATIONS: Current Outpatient Medications on File Prior to Visit  Medication Sig Dispense Refill  . famciclovir (FAMVIR) 250 MG tablet TAKE 1 TABLET(250 MG) BY MOUTH TWICE DAILY FOR ITCHING 60 tablet 0  . medroxyPROGESTERone (DEPO-PROVERA) 150 MG/ML injection INJECT 1 ML IN THE MUSCLE ONCE EVERY 3 MONTHS AS DIRECTED (Patient taking differently: Inject 150 mg  into the muscle every 3 (three) months. ) 1 mL 3  . methocarbamol (ROBAXIN) 500 MG tablet Take 1 tablet (500 mg total) by mouth every 8 (eight) hours as needed for muscle spasms. 30 tablet 5  . naratriptan (AMERGE) 1 MG TABS tablet Take 1 tablet (1 mg total) by mouth once as needed for up to 1 dose. Take one (1) tablet at onset of headache DO NOT TAKE MORE than 3 tablets in a week 10 tablet 3  . nortriptyline (PAMELOR) 10 MG capsule Take 1 capsule (10 mg total) by mouth at bedtime. 30 capsule 3  . OXTELLAR XR 600 MG TB24 Take 1,200 mg by mouth 2 (two) times daily. 120 tablet 6  . zonisamide (ZONEGRAN) 100 MG capsule Take 4 caps every night (Patient taking differently: Take 400 mg by mouth every evening. Take 4 caps every night) 120 capsule 6   No current facility-administered medications on file prior to visit.     ALLERGIES: Allergies  Allergen Reactions  . Morphine Other (See Comments)    Unknown Reaction  unknown    FAMILY HISTORY: Family History  Problem Relation Age of Onset  . Hypertension Mother     SOCIAL HISTORY: Social History   Socioeconomic History  . Marital status: Single    Spouse name: Not on file  . Number of children: 2  . Years of education: 66, GED  . Highest education level: Not on file  Occupational History  Comment: na  Social Needs  . Financial resource strain: Not on file  . Food insecurity:    Worry: Not on file    Inability: Not on file  . Transportation needs:    Medical: Not on file    Non-medical: Not on file  Tobacco Use  . Smoking status: Current Every Day Smoker    Packs/day: 1.00    Years: 10.00    Pack years: 10.00    Types: Cigarettes  . Smokeless tobacco: Never Used  Substance and Sexual Activity  . Alcohol use: Yes    Alcohol/week: 2.0 standard drinks    Types: 2 Cans of beer per week    Comment: occasionally  . Drug use: Yes    Types: Marijuana    Comment: 3 times a week, 08/05/2016 "not often"  . Sexual activity: Not  Currently    Birth control/protection: Injection  Lifestyle  . Physical activity:    Days per week: Not on file    Minutes per session: Not on file  . Stress: Not on file  Relationships  . Social connections:    Talks on phone: Not on file    Gets together: Not on file    Attends religious service: Not on file    Active member of club or organization: Not on file    Attends meetings of clubs or organizations: Not on file    Relationship status: Not on file  . Intimate partner violence:    Fear of current or ex partner: Not on file    Emotionally abused: Not on file    Physically abused: Not on file    Forced sexual activity: Not on file  Other Topics Concern  . Not on file  Social History Narrative   Lives  With 2 children in home   Caffeine -Pepsi 2 daily    REVIEW OF SYSTEMS: Constitutional: No fevers, chills, or sweats, no generalized fatigue, change in appetite Eyes: No visual changes, double vision, eye pain Ear, nose and throat: No hearing loss, ear pain, nasal congestion, sore throat Cardiovascular: No chest pain, palpitations Respiratory:  No shortness of breath at rest or with exertion, wheezes GastrointestinaI: No nausea, vomiting, diarrhea, abdominal pain, fecal incontinence Genitourinary:  No dysuria, urinary retention or frequency Musculoskeletal:  No neck pain, back pain Integumentary: No rash, pruritus, skin lesions Neurological: as above Psychiatric: No depression, insomnia, anxiety Endocrine: No palpitations, fatigue, diaphoresis, mood swings, change in appetite, change in weight, increased thirst Hematologic/Lymphatic:  No anemia, purpura, petechiae. Allergic/Immunologic: no itchy/runny eyes, nasal congestion, recent allergic reactions, rashes  PHYSICAL EXAM: Vitals:   06/22/18 1442  BP: 126/68  Pulse: 73  SpO2: 100%   General: No acute distress, flat affect, slow to respond to questions but answers appropriately Head:   Normocephalic/atraumatic Neck: supple, no paraspinal tenderness, full range of motion Heart:  Regular rate and rhythm Lungs:  Clear to auscultation bilaterally Back: No paraspinal tenderness Skin/Extremities: No rash, no edema Neurological Exam: alert and oriented to person, place, and time. No aphasia or dysarthria. Fund of knowledge is appropriate.  Recent and remote memory are intact.  Attention and concentration are normal.    Able to name objects and repeat phrases. Cranial nerves: Pupils equal, round, reactive to light.  Extraocular movements intact with no nystagmus. Visual fields full. Facial sensation intact. No facial asymmetry. Tongue, uvula, palate midline.  Motor: Bulk and tone normal, muscle strength 5/5 throughout with no pronator drift.  Sensation to light touch intact.  No extinction to double simultaneous stimulation.  Finger to nose testing intact.  Gait narrow-based and steady, able to tandem walk adequately.  Romberg negative.  IMPRESSION: This is a 37 yo RH woman with a history of migraines and seizures since 2010, suggestive of intractable focal to bilateral tonic-clonic epilepsy arising from the left temporal region (staring/unresponsive with lip smacking followed by convulsion with head version to the right per family). She has had 2 prior 7-day EMU admissions at [facility] where typical events were not captured, however most recent baseline EEG showed bilateral temporal sharp waves and focal slowing. MRI brain no acute changes seen. She continues to report seizures on 2 AEDs,as well as more dizziness which is possibly due to [employer]. Reduce Oxtellar to 600mg  in AM, 1200mg  in PM, continue Zonisamide 300mg  qhs, we discussed adding on low dose Depakote ER 250mg  qhs for seizure and headache prophylaxis, side effects discussed. Continue nortriptyline 10mg  qhs for headache prophylaxis. We again discussed intractable epilepsy and consideration for presurgical evaluation, she would like to  think about it. She does not drive. She will continue her seizure calendar and follow-up in 3 months.   Thank you for allowing me to participate in her care.  Please do not hesitate to call for any questions or concerns.  The duration of this appointment visit was 27 minutes of face-to-face time with the patient.  Greater than 50% of this time was spent in counseling, explanation of diagnosis, planning of further management, and coordination of care.   Patrcia Dolly, M.D.   CC: Dr. Lodema Hong

## 2018-06-23 ENCOUNTER — Telehealth: Payer: Self-pay | Admitting: Family Medicine

## 2018-06-23 NOTE — Telephone Encounter (Signed)
PCS forms Received Copied, Sleeved Noted

## 2018-07-02 ENCOUNTER — Encounter: Payer: Self-pay | Admitting: *Deleted

## 2018-07-23 ENCOUNTER — Ambulatory Visit (INDEPENDENT_AMBULATORY_CARE_PROVIDER_SITE_OTHER): Payer: Medicare Other | Admitting: Family Medicine

## 2018-07-23 ENCOUNTER — Encounter: Payer: Self-pay | Admitting: Family Medicine

## 2018-07-23 VITALS — BP 114/80 | HR 67 | Resp 12 | Ht 61.0 in | Wt 158.0 lb

## 2018-07-23 DIAGNOSIS — Z79899 Other long term (current) drug therapy: Secondary | ICD-10-CM

## 2018-07-23 DIAGNOSIS — F17210 Nicotine dependence, cigarettes, uncomplicated: Secondary | ICD-10-CM

## 2018-07-23 DIAGNOSIS — B009 Herpesviral infection, unspecified: Secondary | ICD-10-CM | POA: Diagnosis not present

## 2018-07-23 DIAGNOSIS — G40019 Localization-related (focal) (partial) idiopathic epilepsy and epileptic syndromes with seizures of localized onset, intractable, without status epilepticus: Secondary | ICD-10-CM

## 2018-07-23 DIAGNOSIS — Z1159 Encounter for screening for other viral diseases: Secondary | ICD-10-CM | POA: Diagnosis not present

## 2018-07-23 DIAGNOSIS — Z23 Encounter for immunization: Secondary | ICD-10-CM | POA: Diagnosis not present

## 2018-07-23 DIAGNOSIS — F17200 Nicotine dependence, unspecified, uncomplicated: Secondary | ICD-10-CM

## 2018-07-23 MED ORDER — FAMCICLOVIR 250 MG PO TABS: 250.0000 mg | ORAL_TABLET | Freq: Two times a day (BID) | ORAL | 5 refills | Status: DC

## 2018-07-23 NOTE — Progress Notes (Signed)
   [name]     MRN: [id number]      DOB: [birth year]   HPI Ms. Lederman is here for follow up and re-evaluation of chronic medical conditions, medication management and review of any available recent lab and radiology data.  Preventive health is updated, specifically  Cancer screening and Immunization.   Has ongoing breakthrough seizures mos recent 12/14 needs PCS form completed, this is justified as she continues to have breakthrough seizures, and for her 4 multiple ED visits in the past 12 months due to uncontrolled seizures and she is being treated y Neurology for this, and has been for years  ROS Denies recent fever or chills. Denies sinus pressure, nasal congestion, ear pain or sore throat. Denies chest congestion, productive cough or wheezing. Denies chest pains, palpitations and leg swelling Denies abdominal pain, nausea, vomiting,diarrhea or constipation.   Denies dysuria, frequency, hesitancy or incontinence. Denies joint pain, swelling and limitation in mobility.  Denies depression, anxiety or insomnia. Denies skin break down or rash.   PE  BP 114/80 (BP Location: Right Arm, Patient Position: Sitting, Cuff Size: Normal)   Pulse 67   Resp 12   Ht 5\' 1"  (1.549 m)   Wt 158 lb (71.7 kg)   SpO2 98% Comment: room air  BMI 29.85 kg/m   Patient alert and oriented and in no cardiopulmonary distress.  HEENT: No facial asymmetry, EOMI,   oropharynx pink and moist.  Neck supple no JVD, no mass.  Chest: Clear to auscultation bilaterally.  CVS: S1, S2 no murmurs, no S3.Regular rate.  ABD: Soft non tender.   Ext: No edema  MS: Adequate ROM spine, shoulders, hips and knees.  Skin: Intact, no ulcerations or rash noted.  Psych: Good eye contact, normal affect. Memory intact not anxious or depressed appearing.  CNS: CN 2-12 intact, power,  normal throughout.no focal deficits noted.   Assessment & Plan  Localization-related (focal) (partial) idiopathic epilepsy and  epileptic syndromes with seizures of localized onset, intractable, without status epilepticus (HCC) Ongoing seizure  Activity despite medication management , uncontrolled , onset approx 2010. Need for PCS services justified as level of function is inconsistent, also for safety , form completed and sent  She is reminded of the need to e consistent in taking medication as prescribed, as well as keeping Neurology follow up  NICOTINE ADDICTION Asked:confirms currently smokes cigarettes Assess: Unwilling to quit but cutting back Advise: needs to QUIT to reduce risk of cancer, cardio and cerebrovascular disease Assist: counseled for 5 minutes and literature provided Arrange: follow up in 3 months   PCR DNA positive for HSV2 Maintained on prophylactic medication  To reduce flares, refill sent

## 2018-07-23 NOTE — Patient Instructions (Addendum)
Wellness with nurse past due, please schedule  Pelvic and pap due July 29 or after , please schedule  Need to quit smoking, now at 1 PPD, health dept has free classes  Cholesterol and TSH today  Flu vaccine today  Your form to request PCS service for uncontrolled seizure is completed and is being faxed   Steps to Quit Smoking  Smoking tobacco can be bad for your health. It can also affect almost every organ in your body. Smoking puts you and people around you at risk for many serious long-lasting (chronic) diseases. Quitting smoking is hard, but it is one of the best things that you can do for your health. It is never too late to quit. What are the benefits of quitting smoking? When you quit smoking, you lower your risk for getting serious diseases and conditions. They can include:  Lung cancer or lung disease.  Heart disease.  Stroke.  Heart attack.  Not being able to have children (infertility).  Weak bones (osteoporosis) and broken bones (fractures). If you have coughing, wheezing, and shortness of breath, those symptoms may get better when you quit. You may also get sick less often. If you are pregnant, quitting smoking can help to lower your chances of having a baby of low birth weight. What can I do to help me quit smoking? Talk with your doctor about what can help you quit smoking. Some things you can do (strategies) include:  Quitting smoking totally, instead of slowly cutting back how much you smoke over a period of time.  Going to in-person counseling. You are more likely to quit if you go to many counseling sessions.  Using resources and support systems, such as: ? Agricultural engineer with a Veterinary surgeon. ? Phone quitlines. ? Automotive engineer. ? Support groups or group counseling. ? Text messaging programs. ? Mobile phone apps or applications.  Taking medicines. Some of these medicines may have nicotine in them. If you are pregnant or breastfeeding, do not  take any medicines to quit smoking unless your doctor says it is okay. Talk with your doctor about counseling or other things that can help you. Talk with your doctor about using more than one strategy at the same time, such as taking medicines while you are also going to in-person counseling. This can help make quitting easier. What things can I do to make it easier to quit? Quitting smoking might feel very hard at first, but there is a lot that you can do to make it easier. Take these steps:  Talk to your family and friends. Ask them to support and encourage you.  Call phone quitlines, reach out to support groups, or work with a Veterinary surgeon.  Ask people who smoke to not smoke around you.  Avoid places that make you want (trigger) to smoke, such as: ? Bars. ? Parties. ? Smoke-break areas at work.  Spend time with people who do not smoke.  Lower the stress in your life. Stress can make you want to smoke. Try these things to help your stress: ? Getting regular exercise. ? Deep-breathing exercises. ? Yoga. ? Meditating. ? Doing a body scan. To do this, close your eyes, focus on one area of your body at a time from head to toe, and notice which parts of your body are tense. Try to relax the muscles in those areas.  Download or buy apps on your mobile phone or tablet that can help you stick to your quit plan. There are  many free apps, such as QuitGuide from the [employer] developer for Disease Control and Prevention). You can find more support from smokefree.gov and other websites. This information is not intended to replace advice given to you by your health care provider. Make sure you discuss any questions you have with your health care provider. Document Released: 05/04/2009 Document Revised: 03/05/2016 Document Reviewed: 11/22/2014 Elsevier Interactive Patient Education  2019 [employer].

## 2018-07-25 ENCOUNTER — Encounter: Payer: Self-pay | Admitting: Family Medicine

## 2018-07-25 DIAGNOSIS — Z1159 Encounter for screening for other viral diseases: Secondary | ICD-10-CM

## 2018-07-25 DIAGNOSIS — B009 Herpesviral infection, unspecified: Secondary | ICD-10-CM | POA: Insufficient documentation

## 2018-07-25 NOTE — Assessment & Plan Note (Signed)
Maintained on prophylactic medication  To reduce flares, refill sent

## 2018-07-25 NOTE — Assessment & Plan Note (Signed)
Ongoing seizure  Activity despite medication management , uncontrolled , onset approx 2010. Need for PCS services justified as level of function is inconsistent, also for safety , form completed and sent  She is reminded of the need to e consistent in taking medication as prescribed, as well as keeping Neurology follow up

## 2018-07-25 NOTE — Assessment & Plan Note (Signed)
Asked:confirms currently smokes cigarettes Assess: Unwilling to quit but cutting back Advise: needs to QUIT to reduce risk of cancer, cardio and cerebrovascular disease Assist: counseled for 5 minutes and literature provided Arrange: follow up in 3 months  

## 2018-07-29 ENCOUNTER — Ambulatory Visit: Payer: Medicare Other

## 2018-07-30 ENCOUNTER — Ambulatory Visit: Payer: Medicare Other

## 2018-07-30 ENCOUNTER — Ambulatory Visit (INDEPENDENT_AMBULATORY_CARE_PROVIDER_SITE_OTHER): Payer: Medicare Other

## 2018-07-30 VITALS — Ht 62.0 in | Wt 158.8 lb

## 2018-07-30 DIAGNOSIS — Z3042 Encounter for surveillance of injectable contraceptive: Secondary | ICD-10-CM | POA: Diagnosis not present

## 2018-07-30 DIAGNOSIS — Z3202 Encounter for pregnancy test, result negative: Secondary | ICD-10-CM

## 2018-07-30 LAB — POCT URINE PREGNANCY: Preg Test, Ur: NEGATIVE

## 2018-07-30 MED ORDER — MEDROXYPROGESTERONE ACETATE 150 MG/ML IM SUSP
150.0000 mg | Freq: Once | INTRAMUSCULAR | Status: AC
  Administered 2018-07-30: 150 mg via INTRAMUSCULAR

## 2018-07-30 NOTE — Progress Notes (Signed)
Pt here for depo injection 150 mg IM given rt deltoid. Tolerated well. Return 12 weeks for next injection. Pad CMA 

## 2018-08-25 ENCOUNTER — Encounter: Payer: Self-pay | Admitting: *Deleted

## 2018-09-01 NOTE — Telephone Encounter (Signed)
Spoke with PT advised forms was completed I have faxed forms- and pt is aware there is a 29 fee

## 2018-09-03 DIAGNOSIS — G40019 Localization-related (focal) (partial) idiopathic epilepsy and epileptic syndromes with seizures of localized onset, intractable, without status epilepticus: Secondary | ICD-10-CM

## 2018-09-14 ENCOUNTER — Telehealth: Payer: Self-pay | Admitting: Neurology

## 2018-09-14 NOTE — Telephone Encounter (Signed)
Patient is suffering severe headaches and med given does not help. She wants the hydrocodone medication (she used this in the past and it helped) sent to [facility] in [place]. Please send. Thanks!

## 2018-09-14 NOTE — Telephone Encounter (Signed)
Dr. Karel Jarvis does not prescribe Hydrocodone.

## 2018-09-25 ENCOUNTER — Ambulatory Visit: Payer: Medicare Other | Admitting: Neurology

## 2018-09-27 ENCOUNTER — Encounter (HOSPITAL_COMMUNITY): Payer: Self-pay | Admitting: *Deleted

## 2018-09-27 ENCOUNTER — Emergency Department (HOSPITAL_COMMUNITY)
Admission: EM | Admit: 2018-09-27 | Discharge: 2018-09-27 | Disposition: A | Discharge status: Home / Self Care | Payer: Medicare Other | Attending: Emergency Medicine | Admitting: Emergency Medicine

## 2018-09-27 DIAGNOSIS — R569 Unspecified convulsions: Secondary | ICD-10-CM | POA: Diagnosis not present

## 2018-09-27 DIAGNOSIS — F17210 Nicotine dependence, cigarettes, uncomplicated: Secondary | ICD-10-CM | POA: Diagnosis not present

## 2018-09-27 DIAGNOSIS — S0990XA Unspecified injury of head, initial encounter: Secondary | ICD-10-CM | POA: Diagnosis not present

## 2018-09-27 DIAGNOSIS — G40509 Epileptic seizures related to external causes, not intractable, without status epilepticus: Secondary | ICD-10-CM | POA: Diagnosis not present

## 2018-09-27 DIAGNOSIS — W19XXXA Unspecified fall, initial encounter: Secondary | ICD-10-CM | POA: Diagnosis not present

## 2018-09-27 DIAGNOSIS — T426X5A Adverse effect of other antiepileptic and sedative-hypnotic drugs, initial encounter: Secondary | ICD-10-CM | POA: Diagnosis not present

## 2018-09-27 DIAGNOSIS — R4020 Unspecified coma: Secondary | ICD-10-CM | POA: Diagnosis not present

## 2018-09-27 DIAGNOSIS — R58 Hemorrhage, not elsewhere classified: Secondary | ICD-10-CM | POA: Diagnosis not present

## 2018-09-27 DIAGNOSIS — Z79899 Other long term (current) drug therapy: Secondary | ICD-10-CM | POA: Insufficient documentation

## 2018-09-27 LAB — HCG, SERUM, QUALITATIVE: Preg, Serum: NEGATIVE

## 2018-09-27 LAB — BASIC METABOLIC PANEL
ANION GAP: 9 (ref 5–15)
BUN: 7 mg/dL (ref 6–20)
CALCIUM: 8.9 mg/dL (ref 8.9–10.3)
CO2: 18 mmol/L — ABNORMAL LOW (ref 22–32)
Chloride: 109 mmol/L (ref 98–111)
Creatinine, Ser: 1 mg/dL (ref 0.44–1.00)
GFR calc Af Amer: >60 mL/min (ref >60)
GLUCOSE: 86 mg/dL (ref 70–99)
Potassium: 3.9 mmol/L (ref 3.5–5.1)
SODIUM: 136 mmol/L (ref 135–145)

## 2018-09-27 LAB — VALPROIC ACID LEVEL

## 2018-09-27 MED ORDER — VALPROATE SODIUM 500 MG/5ML IV SOLN
INTRAVENOUS | Status: AC
  Filled 2018-09-27: qty 5

## 2018-09-27 MED ORDER — KETOROLAC TROMETHAMINE 30 MG/ML IJ SOLN
30.0000 mg | Freq: Once | INTRAMUSCULAR | Status: AC
  Administered 2018-09-27: 30 mg via INTRAVENOUS
  Filled 2018-09-27: qty 1

## 2018-09-27 MED ORDER — DIVALPROEX SODIUM ER 500 MG PO TB24: 500.0000 mg | ORAL_TABLET | Freq: Every day | ORAL | 1 refills | Status: DC

## 2018-09-27 MED ORDER — VALPROATE SODIUM 500 MG/5ML IV SOLN
500.0000 mg | Freq: Once | INTRAVENOUS | Status: AC
  Administered 2018-09-27: 500 mg via INTRAVENOUS
  Filled 2018-09-27: qty 5

## 2018-09-27 MED ORDER — LORAZEPAM 1 MG PO TABS
1.0000 mg | ORAL_TABLET | Freq: Once | ORAL | Status: AC
  Administered 2018-09-27: 1 mg via ORAL
  Filled 2018-09-27: qty 1

## 2018-09-27 NOTE — Discharge Instructions (Signed)
Please see your Neurologist this week - you MUST have a discussion with them tomorrow about your seizure and need for more medicines.  It appears that you may have missed your dose of Depakote last night - we have given you a dose in the emergency department however you will still need to take your dose this evening when you get home.  Starting tomorrow night please start taking 500 mg of Depakote instead of 250 mg.  Call your neurologist in the morning to open this discussion and receive further guidance  Emergency department for severe or worsening symptoms  Call your dentist about your cracked tooth, it should be seen within the next 3 days at your dentist.

## 2018-09-27 NOTE — ED Provider Notes (Signed)
[facility]ENCY [facility]PARTMENT Provider Note   CSN: [id number] Arrival date & time: 09/27/18  1711    History   Chief Complaint Chief Complaint  Patient presents with  . Loss of Consciousness    HPI [name] is a 38 y.o. female.     HPI  This very pleasant 38 year old female presents after having a seizure that was witnessed by family.  The family reports that they witnessed her standing in the yard when she fell straight forward in a very rigid form going to the ground and having a short amount of seizure activity.  She was unconscious thus they called for paramedic transport.  When the paramedics arrived the patient was coming around but has been confused in route to the hospital but gradually improving and they now state that she is answering questions appropriately.  She had evidence of biting her tongue on the right side, had a cracked right upper central incisor, the patient is now awake enough to tell me that she last had a seizure approximately 4 days ago which was a small 1 and for which the paramedics were not called.  She reports that she takes her medications exactly as prescribed.  She had been feeling some headaches as recently as 2 weeks ago when she called the office and was told that she could not have hydrocodone prescribed over the phone.  The patient's blood sugar was 95 on arrival of the paramedics.  She reports that she was able to eat lunch and was getting ready to grill some steak for dinner.  At this time the patient does not appear to have any confusion.  She denies chest pain shortness of breath blurred vision abdominal pain vomiting diarrhea rashes or any other complaints.  She does endorse compliance with her antiseizure medications.  Past Medical History:  Diagnosis Date  . Back pain   . Chronic headaches   . Depression   . Genital warts    herpes  . Herpes   . Seizures (HCC) 03/15/2016    Patient Active Problem List   Diagnosis Date Noted    . PCR DNA positive for HSV2 07/25/2018  . 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 Seizure (HCC) 11/19/2015  . Amenorrhea 09/28/2011  . Vitamin D deficiency 09/06/2011  . Headache 03/10/2011  . Seizures (HCC) 10/26/2008  . NICOTINE ADDICTION 12/10/2007  . Depression 12/10/2007    Past Surgical History:  Procedure Laterality Date  . CESAREAN SECTION     x 3     OB History    Gravida  3   Para  3   Term      Preterm      AB      Living  3     SAB      TAB      Ectopic      Multiple      Live Births  3            Home Medications    Prior to Admission medications   Medication Sig Start Date End Date Taking? Authorizing Provider  famciclovir (FAMVIR) 250 MG tablet Take 1 tablet (250 mg total) by mouth 2 (two) times daily. 07/23/18  Yes Kerri PerchesSimpson, Margaret E, MD  medroxyPROGESTERone (DEPO-PROVERA) 150 MG/ML injection INJECT 1 ML IN THE MUSCLE ONCE EVERY 3 MONTHS AS DIRECTED Patient taking differently: Inject 150 mg into the muscle  every 3 (three) months.  10/22/17  Yes Adline Potter, NP  methocarbamol (ROBAXIN) 500 MG tablet Take 1 tablet (500 mg total) by mouth every 8 (eight) hours as needed for muscle spasms. 11/14/17  Yes Van Clines, MD  OXTELLAR XR 600 MG TB24 Take 1 tablet in AM, 2 tablets in PM 06/22/18  Yes Van Clines, MD  zonisamide [facility]) 100 MG capsule Take 3 caps every night 06/22/18  Yes Van Clines, MD  divalproex (DEPAKOTE ER) 500 MG 24 hr tablet Take 1 tablet (500 mg total) by mouth at bedtime for 30 days. 09/27/18 10/27/18  Eber Hong, MD  naratriptan (AMERGE) 1 MG TABS tablet Take 1 tablet (1 mg total) by mouth once as needed for up to 1 dose. Take one (1) tablet at onset of headache DO NOT TAKE MORE than 3 tablets in a week 12/05/17   Van Clines, MD  nortriptyline (PAMELOR) 10 MG capsule  Take 1 capsule (10 mg total) by mouth at bedtime. 06/22/18   Van Clines, MD    Family History Family History  Problem Relation Age of Onset  . Hypertension Mother     Social History Social History   Tobacco Use  . Smoking status: Current Every Day Smoker    Packs/day: 1.00    Years: 10.00    Pack years: 10.00    Types: Cigarettes  . Smokeless tobacco: Never Used  Substance Use Topics  . Alcohol use: Yes    Alcohol/week: 2.0 standard drinks    Types: 2 Cans of beer per week    Comment: occasionally  . Drug use: Yes    Types: Marijuana    Comment: occ. use per pt     Allergies   Morphine   Review of Systems Review of Systems  All other systems reviewed and are negative.    Physical Exam Updated Vital Signs BP 117/81   Pulse 81   Temp 97.9 F (36.6 C) (Oral)   Resp (!) 27   Ht 1.549 m (5\' 1" )   Wt 63.5 kg   SpO2 99%   BMI 26.45 kg/m   Physical Exam Vitals signs and nursing note reviewed.  Constitutional:      General: She is not in acute distress.    Appearance: She is well-developed.  HENT:     Head: Normocephalic and atraumatic.     Comments: Abrasion to the right temporal area    Mouth/Throat:     Pharynx: No oropharyngeal exudate.     Comments: No malocclusion of the teeth, there is a right upper central incisor with a cracked but no tenderness, no intrusion or extrusion or subluxation.  There is a slight bite mark to the right side of the tongue but no active bleeding or lacerations to repair Eyes:     General: No scleral icterus.       Right eye: No discharge.        Left eye: No discharge.     Conjunctiva/sclera: Conjunctivae normal.     Pupils: Pupils are equal, round, and reactive to light.  Neck:     Musculoskeletal: Normal range of motion and neck supple.     Thyroid: No thyromegaly.     Vascular: No JVD.  Cardiovascular:     Rate and Rhythm: Normal rate and regular rhythm.     Heart sounds: Normal heart sounds. No murmur. No  friction rub. No gallop.   Pulmonary:     Effort: Pulmonary  effort is normal. No respiratory distress.     Breath sounds: Normal breath sounds. No wheezing or rales.  Abdominal:     General: Bowel sounds are normal. There is no distension.     Palpations: Abdomen is soft. There is no mass.     Tenderness: There is no abdominal tenderness.  Musculoskeletal: Normal range of motion.        General: No tenderness.  Lymphadenopathy:     Cervical: No cervical adenopathy.  Skin:    General: Skin is warm and dry.     Findings: No erythema or rash.  Neurological:     Mental Status: She is alert.     Coordination: Coordination normal.     Comments: Speech is clear, cranial nerves III through XII are intact, memory is intact, strength is normal in all 4 extremities including grips, sensation is intact to light touch and pinprick in all 4 extremities. Coordination as tested by finger-nose-finger is normal, no limb ataxia. Normal gait, normal reflexes at the patellar tendons bilaterally  Psychiatric:        Behavior: Behavior normal.      ED Treatments / Results  Labs (all labs ordered are listed, but only abnormal results are displayed) Labs Reviewed  BASIC METABOLIC PANEL - Abnormal; Notable for the following components:      Result Value   CO2 18 (*)    All other components within normal limits  VALPROIC ACID LEVEL - Abnormal; Notable for the following components:   Valproic Acid Lvl <10 (*)    All other components within normal limits  HCG, SERUM, QUALITATIVE    EKG None  Radiology No results found.  Procedures Procedures (including critical care time)  Medications Ordered in ED Medications  LORazepam (ATIVAN) tablet 1 mg (1 mg Oral Given 09/27/18 1745)  valproate (DEPACON) 500 mg in dextrose 5 % 50 mL IVPB (500 mg Intravenous New Bag/Given 09/27/18 1947)     Initial Impression / Assessment and Plan / ED Course  I have reviewed the triage vital signs and the nursing  notes.  Pertinent labs & imaging results that were available during my care of the patient were reviewed by me and considered in my medical decision making (see chart for details).  Clinical Course as of Sep 27 2019  Wynelle Link Sep 27, 2018  1845 Depakote level is undetectable, this is suspicious for a lack of compliance with that medication.   [BM]  1953 Patient was informed of the new treatment plan, she is in agreement.   [BM]    Clinical Course User Index [BM] Eber Hong, MD       The patient has had a recurrent seizure witnessed by family, she was postictal but is now returned to her normal state.  Will discuss with neurology as the patient's medications do not seem to be controlling her seizures as optimally as possible.  Per the neurology notes from June 22, 2018 the patient has had "intractable epilepsy and migraines".  She is taking Oxtellar, zonisamide and Depakote.  She was instructed to reduce her Oxtellar to 600 mg 1 tablet in the morning and 2 tablets in the evening, zonisamide 3 capsules at night for a total of 300 mg and Depakote was started at 250 mg extended release to be taken at night.  I do not think the patient needs to have a CT scan of her brain, she has no significant signs of trauma and is back to her normal self with no  significant increase in headaches.  Neurologic exam is unremarkable.  I will discuss her care with the neurologist.  Care discussed with on-call neurologist at [facility] Dr. Laurence Slate, he recommends that the patient needs to be given Depakote loaded this evening, take their home medication this evening including Depakote extended release and then go to 500 mg of Depakote at night and follow-up with neurology tomorrow.  Patient is agreeable with this plan  Patient encouraged to follow-up with her neurologist tomorrow as well as the dentist this week, she agrees to the treatment plan  Final Clinical Impressions(s) / ED Diagnoses   Final  diagnoses:  Seizure (HCC)  Seizure secondary to subtherapeutic anticonvulsant medication [facility])    ED Discharge Orders         Ordered    divalproex (DEPAKOTE ER) 500 MG 24 hr tablet  Daily at bedtime     09/27/18 2019           Eber Hong, MD 09/27/18 2021

## 2018-09-27 NOTE — ED Notes (Signed)
ED Provider at bedside. 

## 2018-09-27 NOTE — ED Triage Notes (Signed)
Witnessed by mother pt fell face forward , bit tongue, right front tooth chipped, pt was postictal at first 138/84 107, 97 % cbg 95  Hx of seizures

## 2018-10-12 ENCOUNTER — Telehealth: Payer: Self-pay | Admitting: *Deleted

## 2018-10-12 NOTE — Telephone Encounter (Signed)
Housing Form Received Via fax Copied Noted  Sleeved

## 2018-10-14 DIAGNOSIS — R569 Unspecified convulsions: Secondary | ICD-10-CM

## 2018-10-21 ENCOUNTER — Other Ambulatory Visit: Payer: Self-pay | Admitting: Adult Health

## 2018-10-22 ENCOUNTER — Ambulatory Visit (INDEPENDENT_AMBULATORY_CARE_PROVIDER_SITE_OTHER): Payer: Medicare Other | Admitting: *Deleted

## 2018-10-22 ENCOUNTER — Other Ambulatory Visit: Payer: Self-pay

## 2018-10-22 DIAGNOSIS — Z3042 Encounter for surveillance of injectable contraceptive: Secondary | ICD-10-CM

## 2018-10-22 MED ORDER — MEDROXYPROGESTERONE ACETATE 150 MG/ML IM SUSP
150.0000 mg | Freq: Once | INTRAMUSCULAR | Status: AC
  Administered 2018-10-22: 150 mg via INTRAMUSCULAR

## 2018-10-22 NOTE — Progress Notes (Signed)
Depo Provera 150mg IM given in left deltoid with no complications. Pt to return in 12 weeks for next injection.  

## 2018-11-24 ENCOUNTER — Encounter: Payer: Self-pay | Admitting: Family Medicine

## 2018-11-24 ENCOUNTER — Other Ambulatory Visit: Payer: Self-pay

## 2018-11-24 ENCOUNTER — Ambulatory Visit (INDEPENDENT_AMBULATORY_CARE_PROVIDER_SITE_OTHER): Payer: Medicare Other | Admitting: Family Medicine

## 2018-11-24 VITALS — BP 114/80 | Ht 61.0 in | Wt 140.0 lb

## 2018-11-24 DIAGNOSIS — Z0000 Encounter for general adult medical examination without abnormal findings: Secondary | ICD-10-CM

## 2018-11-24 NOTE — Progress Notes (Signed)
Subjective:   [name] is a 38 y.o. female who presents for Medicare Annual (Subsequent) preventive examination.  Location of Patient: Home Location of Provider: Telehealth Consent was obtain for visit to be over via telehealth. I verified that I am speaking with the correct person using two identifiers.   Review of Systems:    Cardiac Risk Factors include: none     Objective:     Vitals: BP 114/80   Ht  (1.549 m)   Wt 140 lb (63.5 kg)   BMI 26.45 kg/m   Body mass index is 26.45 kg/m.  Advanced Directives 03/22/2018 03/05/2018 09/12/2017 06/11/2017 04/26/2017 04/21/2017 11/27/2016  Does Patient Have a Medical Advance Directive? No No No No No No No  Would patient like information on creating a medical advance directive? - No - Patient declined No - Patient declined - - - -    Tobacco Social History   Tobacco Use  Smoking Status Current Every Day Smoker  . Packs/day: 1.00  . Years: 10.00  . Pack years: 10.00  . Types: Cigarettes  Smokeless Tobacco Never Used     Ready to quit: No Counseling given: Yes   Clinical Intake:  Pre-visit preparation completed: Yes  Pain : No/denies pain Pain Score: 0-No pain     Nutritional Status: BMI 25 -29 Overweight Nutritional Risks: None Diabetes: No  How often do you need to have someone help you when you read instructions, pamphlets, or other written materials from your doctor or pharmacy?: 1 - Never What is the last grade level you completed in school?: 9  Interpreter Needed?: No     Past Medical History:  Diagnosis Date  . Back pain   . Chronic headaches   . Depression   . Genital warts    herpes  . Herpes   . Seizures (HCC) 03/15/2016   Past Surgical History:  Procedure Laterality Date  . CESAREAN SECTION     x 3   Family History  Problem Relation Age of Onset  . Hypertension Mother    Social History   Socioeconomic History  . Marital status: Single    Spouse name: Not on file  .  Number of children: 2  . Years of education: 17, GED  . Highest education level: Not on file  Occupational History    Comment: na  Social Needs  . Financial resource strain: Not on file  . Food insecurity:    Worry: Not on file    Inability: Not on file  . Transportation needs:    Medical: Not on file    Non-medical: Not on file  Tobacco Use  . Smoking status: Current Every Day Smoker    Packs/day: 1.00    Years: 10.00    Pack years: 10.00    Types: Cigarettes  . Smokeless tobacco: Never Used  Substance and Sexual Activity  . Alcohol use: Yes    Alcohol/week: 2.0 standard drinks    Types: 2 Cans of beer per week    Comment: occasionally  . Drug use: Yes    Types: Marijuana    Comment: occ. use per pt  . Sexual activity: Not Currently    Birth control/protection: Injection  Lifestyle  . Physical activity:    Days per week: Not on file    Minutes per session: Not on file  . Stress: Not on file  Relationships  . Social connections:    Talks on phone: Not on file  Gets together: Not on file    Attends religious service: Not on file    Active member of club or organization: Not on file    Attends meetings of clubs or organizations: Not on file    Relationship status: Not on file  Other Topics Concern  . Not on file  Social History Narrative   Lives  With 2 children in home   Caffeine -Pepsi 2 daily    Outpatient Encounter Medications as of 11/24/2018  Medication Sig  . famciclovir (FAMVIR) 250 MG tablet Take 1 tablet (250 mg total) by mouth 2 (two) times daily.  . medroxyPROGESTERone (DEPO-PROVERA) 150 MG/ML injection INJECT 1 ML IN THE MUSCLE ONCE EVERY 3 MONTHS AS DIRECTED  . methocarbamol (ROBAXIN) 500 MG tablet Take 1 tablet (500 mg total) by mouth every 8 (eight) hours as needed for muscle spasms.  . naratriptan (AMERGE) 1 MG TABS tablet Take 1 tablet (1 mg total) by mouth once as needed for up to 1 dose. Take one (1) tablet at onset of headache DO NOT TAKE MORE  than 3 tablets in a week  . nortriptyline (PAMELOR) 10 MG capsule Take 1 capsule (10 mg total) by mouth at bedtime.  Verda Cumins XR 600 MG TB24 Take 1 tablet in AM, 2 tablets in PM  . zonisamide (ZONEGRAN) 100 MG capsule Take 3 caps every night  . divalproex (DEPAKOTE ER) 500 MG 24 hr tablet Take 1 tablet (500 mg total) by mouth at bedtime for 30 days.   No facility-administered encounter medications on file as of 11/24/2018.     Activities of Daily Living In your present state of health, do you have any difficulty performing the following activities: 11/24/2018  Hearing? N  Vision? N  Difficulty concentrating or making decisions? N  Walking or climbing stairs? N  Dressing or bathing? N  Doing errands, shopping? Y  Preparing Food and eating ? N  Using the Toilet? N  In the past six months, have you accidently leaked urine? N  Do you have problems with loss of bowel control? N  Managing your Medications? N  Managing your Finances? N  Housekeeping or managing your Housekeeping? N  Some recent data might be hidden    Patient Care Team: Kerri Perches, MD as PCP - General Beryle Beams, MD as Consulting Physician (Neurology) Tilda Burrow, MD as Consulting Physician (Obstetrics and Gynecology)    Assessment:   This is a routine wellness examination for [name].  Exercise Activities and Dietary recommendations Current Exercise Habits: The patient does not participate in regular exercise at present, Exercise limited by: neurologic condition(s);psychological condition(s)  Goals    . Increase water intake (pt-stated)     She would like to start drinking 32 ounces of water a day.    . Quit smoking / using tobacco (pt-stated)     She would like to cut back to half a pack of cigarettes a day and slowly wean herself off completely.       Fall Risk Fall Risk  11/24/2018 07/23/2018 06/22/2018 02/20/2018 11/14/2017  Falls in the past year? 0 1 1 Yes No  Comment - - - - -  Number  falls in past yr: - 1 1 2  or more -  Injury with Fall? 0 1 1 Yes -  Risk Factor Category  - - - High Fall Risk -  Risk for fall due to : - - - - -  Risk for fall due to: Comment - - - - -  Is the patient's home free of loose throw rugs in walkways, pet beds, electrical cords, etc?   yes      Grab bars in the bathroom? yes      Handrails on the stairs?   yes      Adequate lighting?   yes  Timed Get Up and Go performed:  n/a  Depression Screen PHQ 2/9 Scores 11/24/2018 07/23/2018 11/25/2017 11/25/2017  PHQ - 2 Score 0 0 5 1  PHQ- 9 Score - - 19 -     Cognitive Function     6CIT Screen 11/24/2018 08/05/2016  What Year? 0 points 0 points  What month? 0 points 0 points  What time? 0 points 0 points  Count back from 20 0 points 0 points  Months in reverse 0 points 0 points  Repeat phrase 0 points 0 points  Total Score 0 0    Immunization History  Administered Date(s) Administered  . Influenza Split 04/22/2012  . Influenza Whole 05/26/2009  . Influenza,inj,Quad PF,6+ Mos 04/22/2013, 06/01/2014, 09/20/2015, 08/05/2016, 07/23/2018  . Td 07/27/2009  . Tdap 09/21/2011    Qualifies for Shingles Vaccine? Discuss with Dr Lodema HongSimpson   Screening Tests Health Maintenance  Topic Date Due  . PAP SMEAR-Modifier  02/16/2019  . INFLUENZA VACCINE  02/20/2019  . TETANUS/TDAP  09/20/2021  . HIV Screening  Completed    Cancer Screenings: Lung: Low Dose CT Chest recommended if Age 95-80 years, 30 pack-year currently smoking OR have quit w/in 15years. Patient does not qualify. Breast:  Up to date on Mammogram? Not due yet  Up to date of Bone Density/Dexa? Not due yet Colorectal:  Not due yet  Additional Screenings:   Hepatitis C Screening: To discuss with Dr Lodema HongSimpson      Plan:      1. Encounter for Medicare annual wellness exam  I have personally reviewed and noted the following in the patient's chart:   . Medical and social history . Use of alcohol, tobacco or illicit drugs  . Current  medications and supplements . Functional ability and status . Nutritional status . Physical activity . Advanced directives . List of other physicians . Hospitalizations, surgeries, and ER visits in previous 12 months . Vitals . Screenings to include cognitive, depression, and falls . Referrals and appointments  In addition, I have reviewed and discussed with patient certain preventive protocols, quality metrics, and best practice recommendations. A written personalized care plan for preventive services as well as general preventive health recommendations were provided to patient.   I provided 20 minutes of non-face-to-face time during this encounter.   Freddy FinnerHannah M Jowanda Heeg, NP  11/24/2018

## 2018-11-24 NOTE — Patient Instructions (Signed)
[name] , Thank you for taking time to come for your Medicare Wellness Visit. I appreciate your ongoing commitment to your health goals. Please review the following plan we discussed and let me know if I can assist you in the future.   Preventive Care 40-64 Years, Female Preventive care refers to lifestyle choices and visits with your health care provider that can promote health and wellness. What does preventive care include?  A yearly physical exam. This is also called an annual well check.  Dental exams once or twice a year.  Routine eye exams. Ask your health care provider how often you should have your eyes checked.  Personal lifestyle choices, including:  Daily care of your teeth and gums.  Regular physical activity.  Eating a healthy diet.  Avoiding tobacco and drug use.  Limiting alcohol use.  Practicing safe sex.  Taking low-dose aspirin daily starting at age 50.  Taking vitamin and mineral supplements as recommended by your health care provider. What happens during an annual well check? The services and screenings done by your health care provider during your annual well check will depend on your age, overall health, lifestyle risk factors, and family history of disease. Counseling  Your health care provider may ask you questions about your:  Alcohol use.  Tobacco use.  Drug use.  Emotional well-being.  Home and relationship well-being.  Sexual activity.  Eating habits.  Work and work environment.  Method of birth control.  Menstrual cycle.  Pregnancy history. Screening  You may have the following tests or measurements:  Height, weight, and BMI.  Blood pressure.  Lipid and cholesterol levels. These may be checked every 5 years, or more frequently if you are over 50 years old.  Skin check.  Lung cancer screening. You may have this screening every year starting at age 55 if you have a 30-pack-year history of smoking and currently smoke or  have quit within the past 15 years.  Fecal occult blood test (FOBT) of the stool. You may have this test every year starting at age 50.  Flexible sigmoidoscopy or colonoscopy. You may have a sigmoidoscopy every 5 years or a colonoscopy every 10 years starting at age 50.  Hepatitis C blood test.  Hepatitis B blood test.  Sexually transmitted disease (STD) testing.  Diabetes screening. This is done by checking your blood sugar (glucose) after you have not eaten for a while (fasting). You may have this done every 1-3 years.  Mammogram. This may be done every 1-2 years. Talk to your health care provider about when you should start having regular mammograms. This may depend on whether you have a family history of breast cancer.  BRCA-related cancer screening. This may be done if you have a family history of breast, ovarian, tubal, or peritoneal cancers.  Pelvic exam and Pap test. This may be done every 3 years starting at age 21. Starting at age 30, this may be done every 5 years if you have a Pap test in combination with an HPV test.  Bone density scan. This is done to screen for osteoporosis. You may have this scan if you are at high risk for osteoporosis. Discuss your test results, treatment options, and if necessary, the need for more tests with your health care provider. Vaccines  Your health care provider may recommend certain vaccines, such as:  Influenza vaccine. This is recommended every year.  Tetanus, diphtheria, and acellular pertussis (Tdap, Td) vaccine. You may need a Td booster every   10 years.  Zoster vaccine. You may need this after age 60.  Pneumococcal 13-valent conjugate (PCV13) vaccine. You may need this if you have certain conditions and were not previously vaccinated.  Pneumococcal polysaccharide (PPSV23) vaccine. You may need one or two doses if you smoke cigarettes or if you have certain conditions. Talk to your health care provider about which screenings and  vaccines you need and how often you need them. This information is not intended to replace advice given to you by your health care provider. Make sure you discuss any questions you have with your health care provider. Document Released: 08/04/2015 Document Revised: 03/27/2016 Document Reviewed: 05/09/2015 Elsevier Interactive Patient Education  2017 [employer]    Fall Prevention in the Home Falls can cause injuries. They can happen to people of all ages. There are many things you can do to make your home safe and to help prevent falls. What can I do on the outside of my home?  Regularly fix the edges of walkways and driveways and fix any cracks.  Remove anything that might make you trip as you walk through a door, such as a raised step or threshold.  Trim any bushes or trees on the path to your home.  Use bright outdoor lighting.  Clear any walking paths of anything that might make someone trip, such as rocks or tools.  Regularly check to see if handrails are loose or broken. Make sure that both sides of any steps have handrails.  Any raised decks and porches should have guardrails on the edges.  Have any leaves, snow, or ice cleared regularly.  Use sand or salt on walking paths during winter.  Clean up any spills in your garage right away. This includes oil or grease spills. What can I do in the bathroom?  Use night lights.  Install grab bars by the toilet and in the tub and shower. Do not use towel bars as grab bars.  Use non-skid mats or decals in the tub or shower.  If you need to sit down in the shower, use a plastic, non-slip stool.  Keep the floor dry. Clean up any water that spills on the floor as soon as it happens.  Remove soap buildup in the tub or shower regularly.  Attach bath mats securely with double-sided non-slip rug tape.  Do not have throw rugs and other things on the floor that can make you trip. What can I do in the bedroom?  Use night lights.   Make sure that you have a light by your bed that is easy to reach.  Do not use any sheets or blankets that are too big for your bed. They should not hang down onto the floor.  Have a firm chair that has side arms. You can use this for support while you get dressed.  Do not have throw rugs and other things on the floor that can make you trip. What can I do in the kitchen?  Clean up any spills right away.  Avoid walking on wet floors.  Keep items that you use a lot in easy-to-reach places.  If you need to reach something above you, use a strong step stool that has a grab bar.  Keep electrical cords out of the way.  Do not use floor polish or wax that makes floors slippery. If you must use wax, use non-skid floor wax.  Do not have throw rugs and other things on the floor that can make you trip.   What can I do with my stairs?  Do not leave any items on the stairs.  Make sure that there are handrails on both sides of the stairs and use them. Fix handrails that are broken or loose. Make sure that handrails are as long as the stairways.  Check any carpeting to make sure that it is firmly attached to the stairs. Fix any carpet that is loose or worn.  Avoid having throw rugs at the top or bottom of the stairs. If you do have throw rugs, attach them to the floor with carpet tape.  Make sure that you have a light switch at the top of the stairs and the bottom of the stairs. If you do not have them, ask someone to add them for you. What else can I do to help prevent falls?  Wear shoes that:  Do not have high heels.  Have rubber bottoms.  Are comfortable and fit you well.  Are closed at the toe. Do not wear sandals.  If you use a stepladder:  Make sure that it is fully opened. Do not climb a closed stepladder.  Make sure that both sides of the stepladder are locked into place.  Ask someone to hold it for you, if possible.  Clearly mark and make sure that you can see:  Any  grab bars or handrails.  First and last steps.  Where the edge of each step is.  Use tools that help you move around (mobility aids) if they are needed. These include:  Canes.  Walkers.  Scooters.  Crutches.  Turn on the lights when you go into a dark area. Replace any light bulbs as soon as they burn out.  Set up your furniture so you have a clear path. Avoid moving your furniture around.  If any of your floors are uneven, fix them.  If there are any pets around you, be aware of where they are.  Review your medicines with your doctor. Some medicines can make you feel dizzy. This can increase your chance of falling. Ask your doctor what other things that you can do to help prevent falls. This information is not intended to replace advice given to you by your health care provider. Make sure you discuss any questions you have with your health care provider. Document Released: 05/04/2009 Document Revised: 12/14/2015 Document Reviewed: 08/12/2014 Elsevier Interactive Patient Education  2017 [employer].

## 2018-11-27 ENCOUNTER — Other Ambulatory Visit: Payer: Self-pay | Admitting: Neurology

## 2018-12-03 ENCOUNTER — Encounter (HOSPITAL_COMMUNITY): Payer: Self-pay | Admitting: Emergency Medicine

## 2018-12-03 ENCOUNTER — Emergency Department (HOSPITAL_COMMUNITY)
Admission: EM | Admit: 2018-12-03 | Discharge: 2018-12-03 | Disposition: A | Discharge status: Home / Self Care | Payer: Medicare Other | Attending: Emergency Medicine | Admitting: Emergency Medicine

## 2018-12-03 ENCOUNTER — Telehealth: Payer: Self-pay | Admitting: Neurology

## 2018-12-03 ENCOUNTER — Other Ambulatory Visit: Payer: Self-pay

## 2018-12-03 DIAGNOSIS — F17210 Nicotine dependence, cigarettes, uncomplicated: Secondary | ICD-10-CM | POA: Insufficient documentation

## 2018-12-03 DIAGNOSIS — R569 Unspecified convulsions: Secondary | ICD-10-CM | POA: Diagnosis not present

## 2018-12-03 DIAGNOSIS — G40909 Epilepsy, unspecified, not intractable, without status epilepticus: Secondary | ICD-10-CM | POA: Diagnosis not present

## 2018-12-03 DIAGNOSIS — E871 Hypo-osmolality and hyponatremia: Secondary | ICD-10-CM | POA: Diagnosis not present

## 2018-12-03 LAB — VALPROIC ACID LEVEL: Valproic Acid Lvl: 12 ug/mL — ABNORMAL LOW (ref 50.0–100.0)

## 2018-12-03 LAB — COMPREHENSIVE METABOLIC PANEL WITH GFR
ALT: 10 U/L (ref 0–44)
AST: 10 U/L — ABNORMAL LOW (ref 15–41)
Albumin: 3.8 g/dL (ref 3.5–5.0)
Alkaline Phosphatase: 78 U/L (ref 38–126)
Anion gap: 8 (ref 5–15)
BUN: 5 mg/dL — ABNORMAL LOW (ref 6–20)
CO2: 19 mmol/L — ABNORMAL LOW (ref 22–32)
Calcium: 8.4 mg/dL — ABNORMAL LOW (ref 8.9–10.3)
Chloride: 104 mmol/L (ref 98–111)
Creatinine, Ser: 0.8 mg/dL (ref 0.44–1.00)
GFR calc Af Amer: >60 mL/min
GFR calc non Af Amer: >60 mL/min
Glucose, Bld: 91 mg/dL (ref 70–99)
Potassium: 4.4 mmol/L (ref 3.5–5.1)
Sodium: 131 mmol/L — ABNORMAL LOW (ref 135–145)
Total Bilirubin: 0.2 mg/dL — ABNORMAL LOW (ref 0.3–1.2)
Total Protein: 7.1 g/dL (ref 6.5–8.1)

## 2018-12-03 LAB — MAGNESIUM: Magnesium: 2.1 mg/dL (ref 1.7–2.4)

## 2018-12-03 LAB — CBC
HCT: 43.1 % (ref 36.0–46.0)
Hemoglobin: 14 g/dL (ref 12.0–15.0)
MCH: 32.9 pg (ref 26.0–34.0)
MCHC: 32.5 g/dL (ref 30.0–36.0)
MCV: 101.4 fL — ABNORMAL HIGH (ref 80.0–100.0)
Platelets: 254 10*3/uL (ref 150–400)
RBC: 4.25 MIL/uL (ref 3.87–5.11)
RDW: 14.3 % (ref 11.5–15.5)
WBC: 3.5 10*3/uL — ABNORMAL LOW (ref 4.0–10.5)
nRBC: 0 % (ref 0.0–0.2)

## 2018-12-03 MED ORDER — SODIUM CHLORIDE 0.9 % IV SOLN: INTRAVENOUS | Status: DC

## 2018-12-03 MED ORDER — SODIUM CHLORIDE 0.9 % IV BOLUS: 500.0000 mL | Freq: Once | INTRAVENOUS | Status: DC

## 2018-12-03 MED ORDER — SODIUM CHLORIDE 0.9 % IV BOLUS
1000.0000 mL | Freq: Once | INTRAVENOUS | Status: AC
  Administered 2018-12-03: 1000 mL via INTRAVENOUS

## 2018-12-03 NOTE — Discharge Instructions (Addendum)
Continue all your current medications.  Return for any new or worse symptoms.  Depakote level was low today make sure you are getting your extended release Depakote medication.  Call of our neurology for follow-up.  Follow back up with Dr. Lodema Hong for electrolyte recheck.  This will be in about a week or 2.

## 2018-12-03 NOTE — Telephone Encounter (Signed)
Patient left VM about back to back seizures for the past couple of weeks. She is currently in the ED. She had two seizures today so far. Please call her back at [phone]. Thanks!

## 2018-12-03 NOTE — ED Triage Notes (Signed)
Pt had witnessed seizure that lasted approx 1 min per family. When EMS arrived, pt was awake, but post-ictal. She has become increasingly more responsive. Family stated that pt has known hx of seizures, but they have been more frequent over the past month.

## 2018-12-03 NOTE — ED Provider Notes (Signed)
[facility] EMERGENCY DEPARTMENT Provider Note   CSN: [id number] Arrival date & time: 12/03/18  1329    History   Chief Complaint Chief Complaint  Patient presents with  . Seizures    HPI [name] is a 38 y.o. female.     Patient with known history of seizures.  Patient had a witnessed seizure approximately lasting 1 minute per family shortly prior to arrival.  Patient brought in by EMS.  When EMS arrived patient was awake but postictal.  Became more responsive in route.  Patient has known history of seizures.  Patient does have frequent seizures.  [facility]  Patient states she now feels fine.  Did not bite her tongue.  No incontinence.  States she is taking all of her medications for seizures.  She is on Depakote Oxaytellar, and Zonegran.  Patient states she felt fine yesterday and felt fine right prior to the seizure.  Has no memory of it.     Past Medical History:  Diagnosis Date  . Back pain   . Chronic headaches   . Depression   . Genital warts    herpes  . Herpes   . Seizures (HCC) 03/15/2016    Patient Active Problem List   Diagnosis Date Noted  . PCR DNA positive for HSV2 07/25/2018  . 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 Seizure (HCC) 11/19/2015  . Amenorrhea 09/28/2011  . Vitamin D deficiency 09/06/2011  . Headache 03/10/2011  . Seizures (HCC) 10/26/2008  . NICOTINE ADDICTION 12/10/2007  . Depression 12/10/2007    Past Surgical History:  Procedure Laterality Date  . CESAREAN SECTION     x 3     OB History    Gravida  3   Para  3   Term      Preterm      AB      Living  3     SAB      TAB      Ectopic      Multiple      Live Births  3            Home Medications    Prior to Admission medications   Medication Sig Start Date End Date Taking?  Authorizing Provider  divalproex (DEPAKOTE ER) 500 MG 24 hr tablet Take 1 tablet (500 mg total) by mouth at bedtime for 30 days. 09/27/18 12/03/18 Yes Eber HongMiller, Brian, MD  famciclovir (FAMVIR) 250 MG tablet Take 1 tablet (250 mg total) by mouth 2 (two) times daily. 07/23/18  Yes Kerri PerchesSimpson, Margaret E, MD  medroxyPROGESTERone (DEPO-PROVERA) 150 MG/ML injection INJECT 1 ML IN THE MUSCLE ONCE EVERY 3 MONTHS AS DIRECTED 10/22/18  Yes Cyril MourningGriffin, Jennifer A, NP  naratriptan (AMERGE) 1 MG TABS tablet Take 1 tablet (1 mg total) by mouth once as needed for up to 1 dose. Take one (1) tablet at onset of headache DO NOT TAKE MORE than 3 tablets in a week 12/05/17  Yes Van ClinesAquino, Karen M, MD  nortriptyline (PAMELOR) 10 MG capsule Take 1 capsule (10 mg total) by mouth at bedtime. 06/22/18  Yes Van ClinesAquino, Karen M, MD  OXTELLAR XR 600 MG TB24 Take 1 tablet in AM, 2 tablets in PM 06/22/18  Yes Van ClinesAquino, Karen M, MD  zonisamide [facility](ZONEGRAN) 100 MG capsule Take 3 caps every night 06/22/18  Yes Van ClinesAquino, Karen M, MD  methocarbamol (ROBAXIN) 500 MG tablet TAKE 1 TABLET(500 MG) BY MOUTH EVERY 8 HOURS AS NEEDED FOR MUSCLE SPASMS Patient not taking: Reported on 12/03/2018 11/30/18   Van Clines, MD    Family History Family History  Problem Relation Age of Onset  . Hypertension Mother     Social History Social History   Tobacco Use  . Smoking status: Current Every Day Smoker    Packs/day: 1.00    Years: 10.00    Pack years: 10.00    Types: Cigarettes  . Smokeless tobacco: Never Used  Substance Use Topics  . Alcohol use: Yes    Alcohol/week: 2.0 standard drinks    Types: 2 Cans of beer per week    Comment: occasionally  . Drug use: Yes    Types: Marijuana    Comment: occ. use per pt     Allergies   Morphine   Review of Systems Review of Systems  Constitutional: Negative for chills and fever.  HENT: Negative for rhinorrhea and sore throat.   Eyes: Negative for visual disturbance.  Respiratory: Negative for cough and  shortness of breath.   Cardiovascular: Negative for chest pain and leg swelling.  Gastrointestinal: Negative for abdominal pain, diarrhea, nausea and vomiting.  Genitourinary: Negative for dysuria.  Musculoskeletal: Negative for back pain and neck pain.  Skin: Negative for rash.  Neurological: Positive for seizures. Negative for dizziness, light-headedness and headaches.  Hematological: Does not bruise/bleed easily.  Psychiatric/Behavioral: Negative for confusion.     Physical Exam Updated Vital Signs BP 124/82   Pulse 77   Temp 98.5 F (36.9 C) (Oral)   Resp 18   Ht 1.549 m ( )   Wt 63.5 kg   SpO2 99%   BMI 26.45 kg/m   Physical Exam Vitals signs and nursing note reviewed.  Constitutional:      General: She is not in acute distress.    Appearance: She is well-developed.  HENT:     Head: Normocephalic and atraumatic.  Eyes:     Extraocular Movements: Extraocular movements intact.     Conjunctiva/sclera: Conjunctivae normal.     Pupils: Pupils are equal, round, and reactive to light.  Neck:     Musculoskeletal: Normal range of motion and neck supple.  Cardiovascular:     Rate and Rhythm: Normal rate and regular rhythm.     Heart sounds: No murmur.  Pulmonary:     Effort: Pulmonary effort is normal. No respiratory distress.     Breath sounds: Normal breath sounds.  Abdominal:     Palpations: Abdomen is soft.     Tenderness: There is no abdominal tenderness.  Musculoskeletal: Normal range of motion.        General: No swelling.  Skin:    General: Skin is warm and dry.     Capillary Refill: Capillary refill takes less than 2 seconds.  Neurological:     General: No focal deficit present.     Mental Status: She is alert and oriented to person, place, and time.     Cranial Nerves: No cranial nerve deficit.     Sensory: No sensory deficit.      ED Treatments / Results  Labs (all labs ordered are listed, but only abnormal results are displayed) Labs Reviewed   COMPREHENSIVE METABOLIC PANEL - Abnormal; Notable for the following components:      Result Value   Sodium 131 (*)    CO2 19 (*)    BUN 5 (*)  Calcium 8.4 (*)    AST 10 (*)    Total Bilirubin 0.2 (*)    All other components within normal limits  CBC - Abnormal; Notable for the following components:   WBC 3.5 (*)    MCV 101.4 (*)    All other components within normal limits  VALPROIC ACID LEVEL - Abnormal; Notable for the following components:   Valproic Acid Lvl 12 (*)    All other components within normal limits  MAGNESIUM    EKG EKG Interpretation  Date/Time:  Thursday Dec 03 2018 13:41:22 EDT Ventricular Rate:  79 PR Interval:    QRS Duration: 84 QT Interval:  349 QTC Calculation: 400 R Axis:   46 Text Interpretation:  Sinus rhythm Borderline T abnormalities, anterior leads Baseline wander in lead(s) V5 No significant change since last tracing Confirmed by Vanetta Mulders [phone]) on 12/03/2018 2:54:50 PM   Radiology No results found.  Procedures Procedures (including critical care time)  Medications Ordered in ED Medications  sodium chloride 0.9 % bolus 1,000 mL (1,000 mLs Intravenous New Bag/Given 12/03/18 1609)     Initial Impression / Assessment and Plan / ED Course  I have reviewed the triage vital signs and the nursing notes.  Pertinent labs & imaging results that were available during my care of the patient were reviewed by me and considered in my medical decision making (see chart for details).        Patient now feeling back to baseline.  Labs significant for a mild hyponatremia.  Patient given 1 L of IV fluids.  Patient's Depakote level was subtherapeutic.  Patient's been stable here no further seizures.  Patient states she feels well enough to go home and she wants to go home.  Claims that she is taking her Depakote.  Let her know that she was subtherapeutic.  Mid and recommended she make sure that she is taking it.  We will have her follow-up  with primary care doctor in a week to have electrolytes rechecked.  We will also have her call and make an appointment with her neurologist.  Patient will return for any new or worse symptoms.  Final Clinical Impressions(s) / ED Diagnoses   Final diagnoses:  Seizure [facility])  Hyponatremia    ED Discharge Orders    None       Vanetta Mulders, MD 12/03/18 1714

## 2018-12-04 NOTE — Telephone Encounter (Signed)
Pt called to get e-visit set up from post ed visit for seizures

## 2018-12-07 ENCOUNTER — Encounter: Payer: Self-pay | Admitting: *Deleted

## 2018-12-08 ENCOUNTER — Other Ambulatory Visit: Payer: Self-pay

## 2018-12-08 ENCOUNTER — Telehealth (INDEPENDENT_AMBULATORY_CARE_PROVIDER_SITE_OTHER): Payer: Medicare Other | Admitting: Neurology

## 2018-12-08 DIAGNOSIS — G40019 Localization-related (focal) (partial) idiopathic epilepsy and epileptic syndromes with seizures of localized onset, intractable, without status epilepticus: Secondary | ICD-10-CM | POA: Diagnosis not present

## 2018-12-08 DIAGNOSIS — R569 Unspecified convulsions: Secondary | ICD-10-CM

## 2018-12-08 DIAGNOSIS — G43009 Migraine without aura, not intractable, without status migrainosus: Secondary | ICD-10-CM

## 2018-12-08 MED ORDER — OXTELLAR XR 600 MG PO TB24: ORAL_TABLET | ORAL | 11 refills | Status: DC

## 2018-12-08 MED ORDER — SUMATRIPTAN SUCCINATE 6 MG/0.5ML ~~LOC~~ SOAJ: SUBCUTANEOUS | 11 refills | Status: DC

## 2018-12-08 MED ORDER — DIVALPROEX SODIUM ER 500 MG PO TB24: ORAL_TABLET | ORAL | 11 refills | Status: DC

## 2018-12-08 MED ORDER — ZONISAMIDE 100 MG PO CAPS: ORAL_CAPSULE | ORAL | 11 refills | Status: DC

## 2018-12-08 MED ORDER — NORTRIPTYLINE HCL 10 MG PO CAPS: 10.0000 mg | ORAL_CAPSULE | Freq: Every day | ORAL | 11 refills | Status: DC

## 2018-12-08 NOTE — Progress Notes (Signed)
Virtual Visit via Telephone Note The purpose of this virtual visit is to provide medical care while limiting exposure to the novel coronavirus.    Consent was obtained for phone visit:  Yes.   Answered questions that patient had about telehealth interaction:  Yes.   I discussed the limitations, risks, security and privacy concerns of performing an evaluation and management service by telephone. I also discussed with the patient that there may be a patient responsible charge related to this service. The patient expressed understanding and agreed to proceed.  Pt location: Home Physician Location: office Name of referring provider:  Kerri PerchesSimpson, Margaret E, MD I connected with .[name] at patients initiation/request on 12/08/2018 at 11:00 AM EDT by telephone and verified that I am speaking with the correct person using two identifiers.  Pt MRN:  [id number] Pt DOB:  Aug 07, 1980   History of Present Illness:  The patient was last seen in December 2019 for intractable epilepsy. Since her last visit, she brings her seizure calendar which we reviewed today. She had 1 seizure in February (smoking a cigarette and burned a hole in her pillow), 1 in March, 3 in April (smoking and burned herself), 3 so far in May, she had 2 seizures in one day on 12/03/2018 and went to the ER. She has a very bad headache after her seizures. Naratriptan has not been helpful. She was in the ER in March for a seizure and Depakote dose was increased to 500mg  qhs. She continues to take Zonisamide 300mg  qhs and Oxtellar 600mg  in AM, 1200mg  in PM. She is also on nortriptyline 10mg  qhs for headache prophylaxis. She is frustrated with continued seizures and severe migraines after needing ER visits for headache control. She denies any dizziness, vision changes.   History from Initial Assessment 11/14/2017: This is a [age] year old right-handed woman with a history of migraines, back pain, and seizures, presenting to establish care.  Seizures started around 2010 when she started having nocturnal convulsions with tongue bite and urinary incontinence. She would wake up with her children telling her what happened. She would be slightly confused but back to baseline quickly. She was on Keppra and Lamictal with no change in seizure frequency. At that time she was also drinking heavily, but even with decrease in alcohol intake, had not noticed any change in frequency. She was admitted to [facility] for 7 days in October 2012, typical episodes were not captured. EMU report unavailable for review, on discharge summary it was noted there were incidental left-sided sharp waves of which there may or ma not be a correlation to her spells. Keppra stopped and Lamictal dose increased. She was seeing neurologist Dr. Gerilyn Pilgrimoonquah, records unavailable for review, but it appears Fycompa was added to Lamictal. She reported worsening of seizures, now occurring in wakefulness, and was admitted for another 7-day [facility] stay [facility] in January 2018, again typical events were not captured. Baseline EEG was abnormal with intermittent left anterior to midtemporal slow waves, sharply contoured bursts of theta slowing over the left temporal region, as well as intermittent right temporal theta and delta slowing. There were also frequent 3-4 Hz very brief intermediate duration fluctuating runs of rhythmic delta activities (LRDA) maximal at F7. Occasional very brief runs were also seen maximal at F8.There were occasional independent left temporal and right temporal sharp waves. At times left temporal discharges have a wide field. There are ER visits in April, May, October, November 2018 for seizures, medications listed included  Vimpat, Fycompa, and Oxtellar. Her last ER visit was on 11/05/17, taking Oxtellar 1200mg  BID without side effects. She reports compliance to medication.  She brings a calendar of her seizures in 2019. She had 2 in January, 2 in February, 1 in March, 3 so  far in April. The last seizure occurred on 11/11/17 out of sleep, she woke up with a lip bite. She has had a lot of her seizures occur while playing at the [facility]. Family has witnessed the seizures, describing staring/unresponsiveness with lip smacking, then convulsions with head turn to the right. She denies any prior warning symptoms, no post-ictal focal weakness. Her entire body feels sore. No clear seizure triggers, she denies any sleep deprivation or missed medications. She has significantly cut down on alcohol, drinking 1 beer on the weekend.   She denies any olfactory/gustatory hallucinations, deja vu, rising epigastric sensation, focal numbness/tingling/weakness, myoclonic jerks. She has migraines after a seizure and had a prescription for Norco. She also has Zipsor and Robaxin to take after seizures. She denies any dizziness, diplopia, dysarthria/dysphagia, neck/back pain, bowel/bladder dysfunction.   Epilepsy Risk Factors:  Her brother had seizures in childhood. She had a normal birth and early development.  There is no history of febrile convulsions, CNS infections such as meningitis/encephalitis, significant traumatic brain injury, neurosurgical procedures, or family history of seizures. She has a scar over the left temporal region after hitting her head on a window, she denies any loss of consciousness with this.   Prior AEDs: Lamictal, Keppra, Fycompa, Vimpat Laboratory Data:  EEGs: Her EEG at [facility] had shown bilateral temporal slowing, as well as rhythmic delta at F7 and F8, independent left and right temporal sharp waves. MRI: MRI brain with and without contrast in 10/2008 was unremarkable with scatted white matter changes I personally reviewed MRI brain with and without contrast done 11/17/17 which did not show any acute changes, hippocampi symmetric with no abnormal signal or enhancement seen. There were several white matter changes bilaterally with no abnormal enhancement.      Observations/Objective:  Limited due to nature of phone visit. Patient is awake, alert, able to answer questions.   Assessment and Plan:   This is a 38 yo RH woman with a history of migraines and intractable epilepsy. Semiology suggestive of focal to bilateral tonic-clonic epilepsy arising from the left temporal region (staring/unresponsive with lip smacking followed by convulsion with head version to the right per family). She has had 2 prior 7-day EMU admissions at [facility] where typical events were not captured, however most recent baseline EEG showed bilateral temporal sharp waves and focal slowing. MRI brain no acute changes. We discussed increasing Depakote ER 500mg  to 1 tab BID, continue Zonisamide 300mg  qhs and Oxtellear 600mg  in AM, 1200mg  in PM. Continue nortriptyline for migraine prophylaxis. She will try sumatriptan injection for post-ictal headaches/migraines. She would now like to proceed with presurgical evaluation, referral will be sent to [facility]. She does not drive. Continue seizure calendar, follow-up in 3 months, she knows to call for any changes.   Follow Up Instructions: -I discussed the assessment and treatment plan with the patient. The patient was provided an opportunity to ask questions and all were answered. The patient agreed with the plan and demonstrated an understanding of the instructions.   The patient was advised to call back or seek an in-person evaluation if the symptoms worsen or if the condition fails to improve as anticipated.    Total Time spent in visit with the patient was:  25 minutes, of which 100% of the time was spent in counseling and/or coordinating care on the above.   Pt understands and agrees with the plan of care outlined.     Van Clines, MD

## 2018-12-08 NOTE — Progress Notes (Signed)
Neurology referral [facility] placed Referral for presurgical evaluation for intractable epilepsy

## 2019-01-07 ENCOUNTER — Telehealth: Payer: Self-pay | Admitting: Adult Health

## 2019-01-07 NOTE — Telephone Encounter (Signed)
Called patient and left message informing her that we are not allowing any visitors or children to come to visit with her at this time and we are requiring a mask to be worn during the visit. Asked if she has had any exposure to anyone suspected or confirmed of having COVID-19 or if she is experiencing any of the following: fever, cough, sob, muscle pain, severe headache, sore throat, diarrhea, loss of taste or smell or ear, nose or throat discomfort to call and reschedule.  Encouraged to do e-check-in and Hello Patient prior to arrival and to call our office when she is in our office parking lot in order for us to complete registration over the phone.  

## 2019-01-11 ENCOUNTER — Ambulatory Visit (INDEPENDENT_AMBULATORY_CARE_PROVIDER_SITE_OTHER): Payer: Medicare Other | Admitting: *Deleted

## 2019-01-11 ENCOUNTER — Other Ambulatory Visit: Payer: Self-pay

## 2019-01-11 DIAGNOSIS — Z3042 Encounter for surveillance of injectable contraceptive: Secondary | ICD-10-CM | POA: Diagnosis not present

## 2019-01-11 MED ORDER — MEDROXYPROGESTERONE ACETATE 150 MG/ML IM SUSP
150.0000 mg | Freq: Once | INTRAMUSCULAR | Status: AC
  Administered 2019-01-11: 150 mg via INTRAMUSCULAR

## 2019-01-11 NOTE — Progress Notes (Signed)
Depo Provera 150mg IM given in right deltoid with no complications. Pt to return in 12 weeks for next injection.  

## 2019-01-13 ENCOUNTER — Telehealth: Payer: Self-pay | Admitting: Neurology

## 2019-01-13 NOTE — Telephone Encounter (Signed)
Pt called no answer voice mail left to return call

## 2019-01-13 NOTE — Telephone Encounter (Signed)
Pt called checking status of previous call requesting migraine headache medication be called in. Pt also questioning seizure meds which ones she should be taking since she had seizure last night and this morning.

## 2019-01-13 NOTE — Telephone Encounter (Signed)
Missed medications? No missed meds Sleep deprived? Not sleep deprived Alcohol intake?  Had a beer  Other Triggers?  No triggers   Back to their usual baseline self? Back to normal just hurting from fall  Current medications prescribed by Dr. Delice Lesch  Depakote Robaxin amerge pamelor oxteller Sumatriptan zonegran  Pt asking for something different for headaches she does not like the shots  With seizure had fall in tub hit head black eye knot on head

## 2019-01-13 NOTE — Telephone Encounter (Signed)
Pt called states seizure last night and again this morning ambulance came but pt did not go she thought her lip would stop bleeding but it has not. PT REQUEST STOP TAKING SHOT FOR HEADACHES and to start new stronger pills for headaches.  Uses [facility] [place]. Call pt [phone]. ALSO pt has question regarding several medications on her medlist.

## 2019-01-14 ENCOUNTER — Emergency Department (HOSPITAL_COMMUNITY)
Admission: EM | Admit: 2019-01-14 | Discharge: 2019-01-14 | Disposition: A | Discharge status: Home / Self Care | Payer: Medicare Other | Attending: Emergency Medicine | Admitting: Emergency Medicine

## 2019-01-14 ENCOUNTER — Other Ambulatory Visit: Payer: Self-pay | Admitting: Neurology

## 2019-01-14 ENCOUNTER — Telehealth: Payer: Self-pay

## 2019-01-14 ENCOUNTER — Telehealth: Payer: Self-pay | Admitting: Neurology

## 2019-01-14 ENCOUNTER — Emergency Department (HOSPITAL_COMMUNITY): Payer: Medicare Other

## 2019-01-14 ENCOUNTER — Encounter (HOSPITAL_COMMUNITY): Payer: Self-pay

## 2019-01-14 ENCOUNTER — Other Ambulatory Visit: Payer: Self-pay

## 2019-01-14 DIAGNOSIS — Z79899 Other long term (current) drug therapy: Secondary | ICD-10-CM | POA: Insufficient documentation

## 2019-01-14 DIAGNOSIS — R52 Pain, unspecified: Secondary | ICD-10-CM | POA: Diagnosis not present

## 2019-01-14 DIAGNOSIS — F17210 Nicotine dependence, cigarettes, uncomplicated: Secondary | ICD-10-CM | POA: Diagnosis not present

## 2019-01-14 DIAGNOSIS — G40909 Epilepsy, unspecified, not intractable, without status epilepticus: Secondary | ICD-10-CM | POA: Diagnosis not present

## 2019-01-14 DIAGNOSIS — S0990XA Unspecified injury of head, initial encounter: Secondary | ICD-10-CM | POA: Diagnosis not present

## 2019-01-14 DIAGNOSIS — R404 Transient alteration of awareness: Secondary | ICD-10-CM | POA: Diagnosis not present

## 2019-01-14 DIAGNOSIS — R0689 Other abnormalities of breathing: Secondary | ICD-10-CM | POA: Diagnosis not present

## 2019-01-14 DIAGNOSIS — G459 Transient cerebral ischemic attack, unspecified: Secondary | ICD-10-CM | POA: Diagnosis not present

## 2019-01-14 DIAGNOSIS — R569 Unspecified convulsions: Secondary | ICD-10-CM | POA: Diagnosis not present

## 2019-01-14 DIAGNOSIS — R51 Headache: Secondary | ICD-10-CM | POA: Diagnosis not present

## 2019-01-14 DIAGNOSIS — H05221 Edema of right orbit: Secondary | ICD-10-CM | POA: Diagnosis not present

## 2019-01-14 DIAGNOSIS — S0993XA Unspecified injury of face, initial encounter: Secondary | ICD-10-CM | POA: Diagnosis not present

## 2019-01-14 LAB — BASIC METABOLIC PANEL
Anion gap: 13 (ref 5–15)
BUN: 8 mg/dL (ref 6–20)
CO2: 17 mmol/L — ABNORMAL LOW (ref 22–32)
Calcium: 8.7 mg/dL — ABNORMAL LOW (ref 8.9–10.3)
Chloride: 110 mmol/L (ref 98–111)
Creatinine, Ser: 1.02 mg/dL — ABNORMAL HIGH (ref 0.44–1.00)
GFR calc Af Amer: >60 mL/min (ref >60)
GFR calc non Af Amer: >60 mL/min (ref >60)
Glucose, Bld: 98 mg/dL (ref 70–99)
Potassium: 3.9 mmol/L (ref 3.5–5.1)
Sodium: 140 mmol/L (ref 135–145)

## 2019-01-14 LAB — CBC WITH DIFFERENTIAL/PLATELET
Abs Immature Granulocytes: 0.01 10*3/uL (ref 0.00–0.07)
Basophils Absolute: 0 10*3/uL (ref 0.0–0.1)
Basophils Relative: 0 %
Eosinophils Absolute: 0.1 10*3/uL (ref 0.0–0.5)
Eosinophils Relative: 2 %
HCT: 40.5 % (ref 36.0–46.0)
Hemoglobin: 13.2 g/dL (ref 12.0–15.0)
Immature Granulocytes: 0 %
Lymphocytes Relative: 38 %
Lymphs Abs: 1.9 10*3/uL (ref 0.7–4.0)
MCH: 33.1 pg (ref 26.0–34.0)
MCHC: 32.6 g/dL (ref 30.0–36.0)
MCV: 101.5 fL — ABNORMAL HIGH (ref 80.0–100.0)
Monocytes Absolute: 0.4 10*3/uL (ref 0.1–1.0)
Monocytes Relative: 8 %
Neutro Abs: 2.5 10*3/uL (ref 1.7–7.7)
Neutrophils Relative %: 52 %
Platelets: 320 10*3/uL (ref 150–400)
RBC: 3.99 MIL/uL (ref 3.87–5.11)
RDW: 13.6 % (ref 11.5–15.5)
WBC: 5 10*3/uL (ref 4.0–10.5)
nRBC: 0 % (ref 0.0–0.2)

## 2019-01-14 LAB — HCG, SERUM, QUALITATIVE: Preg, Serum: NEGATIVE

## 2019-01-14 LAB — PHENYTOIN LEVEL, TOTAL: Phenytoin Lvl: <2.5 ug/mL — ABNORMAL LOW (ref 10.0–20.0)

## 2019-01-14 LAB — VALPROIC ACID LEVEL: Valproic Acid Lvl: 33 ug/mL — ABNORMAL LOW (ref 50.0–100.0)

## 2019-01-14 MED ORDER — SODIUM CHLORIDE 0.9 % IV SOLN
1000.0000 mg | Freq: Once | INTRAVENOUS | Status: DC
  Filled 2019-01-14: qty 20

## 2019-01-14 MED ORDER — DIVALPROEX SODIUM ER 500 MG PO TB24: ORAL_TABLET | ORAL | 11 refills | Status: DC

## 2019-01-14 MED ORDER — VALPROATE SODIUM 500 MG/5ML IV SOLN
1250.0000 mg | Freq: Once | INTRAVENOUS | Status: AC
  Administered 2019-01-14: 1250 mg via INTRAVENOUS
  Filled 2019-01-14: qty 12.5

## 2019-01-14 MED ORDER — DIHYDROERGOTAMINE MESYLATE 4 MG/ML NA SOLN: NASAL | 12 refills | Status: DC

## 2019-01-14 MED ORDER — OXCARBAZEPINE ER 600 MG PO TB24: 1200.0000 mg | ORAL_TABLET | Freq: Once | ORAL | Status: DC

## 2019-01-14 NOTE — Telephone Encounter (Signed)
Pls have her increase the Depakote ER 500mg : Take 1 tab in AM, 2 tabs in PM. We can try a different nasal spray to help with the migraines after seizures. I will send in the prescription. Instructions for Migranal nasal spray is 1 spray in each nostril, then repeat after 15 minutes. Thanks

## 2019-01-14 NOTE — Progress Notes (Signed)
PA started in cover my meds  waiting for determination of authorization for Dihydroergotamine Mesylate 4mg /ml

## 2019-01-14 NOTE — ED Notes (Signed)
Pt aware a urine sample is needed. 

## 2019-01-14 NOTE — ED Notes (Signed)
Discussion importance of pt taking medications as directed.  Informed of how her seizure medications affect the blood stream and the importance of maintaining those levels.  Pt verbal understood.

## 2019-01-14 NOTE — ED Triage Notes (Signed)
EMS reports pt has history of seizures and ran out of her meds yesterday.  EMS reports pt's daughter witnessed seizure this morning and called ems.  Reports pt had a seizure yesterday also.  EMS says pt was postictal when they arrived.  Pt presently alert and oriented.  Denies any pain.

## 2019-01-14 NOTE — Telephone Encounter (Signed)
Patient just got out of hospital and was returning your call. Thanks!

## 2019-01-14 NOTE — Telephone Encounter (Signed)
Noted, thanks!

## 2019-01-14 NOTE — Discharge Instructions (Signed)
You were seen in the ER today for a seizure.  Your labs did not show significant abnormalities.  Your Depakote level was below therapeutic level therefore you were given a loading dose in the ER.  It is extremely important that you go to the pharmacy and fill your seizure medication prescriptions & take these as prescribed.   Please call your neurologist today to let them know about your ER visit. Do not drive until cleared by your neurologist.   Return to the Er for new or worsening symptoms including but not limited to re-occurrence of seizure, prolonged seizure activity, fever, numbness, weakness, headache, or any other concerns.

## 2019-01-14 NOTE — ED Notes (Signed)
Per ems pt takes oxtellar, zonixamide, and divalproex.  Per ems, pt's bottle of oxtellar was empty.

## 2019-01-14 NOTE — Telephone Encounter (Signed)
Called pt to Increase Depakote 500mg  ER to 1 tab in the am and 2 tab in the pm also about the migraine nasal spray. Pt mother answered the phone pt had another seizure EMS was called and was leaving with the PT to take her to [facility] to be evaluated. Pt mother asked to call office when pt is dc from hospital to up date, I will also call to follow up. Dr. Delice Lesch informed that pt was taken to the hospital, Dr. Delice Lesch has an opening for July 1st @ 11:30 on hold for the pt to be seen once dc from the hospital

## 2019-01-14 NOTE — Telephone Encounter (Signed)
[facility] is calling in about the PA for the Dihydroergotamine medication and is needing the DX and dosage for this. Please call back at [phone]. Thanks!

## 2019-01-14 NOTE — Telephone Encounter (Signed)
Called pt to inform of medication increase and about the migtranal nasal spray. Pt mother answer phone [name] had another Seizure EMS had just left taken her to [facility], advised pt mother to call office to update once [name] was home, so we can schedule hospital follow up.

## 2019-01-14 NOTE — Telephone Encounter (Signed)
Spoke with pt, she is home from the ER, went over medication change from this morning, pt is going to come into the office for follow up appointment July 1st at 11:30.

## 2019-01-14 NOTE — ED Notes (Signed)
Pt reports having seizure on yesterday, right eye black, and reports biting tongue on left side.  Do remember what  Happened this am.  Daughter witnessed seizure and pt was post ictal on EMS arrive.  Pt is wake with relay in reaction.  PA in room and pt says this is her normal past seizure.  Answers all questions appropriately an follows command.  Pt denies any pain at this time.

## 2019-01-14 NOTE — ED Provider Notes (Signed)
[facility] Provider Note   CSN: [id number] Arrival date & time: 01/14/19  [id number]     History   Chief Complaint Chief Complaint  Patient presents with   Seizures    HPI [name] is a 38 y.o. female with a hx of tobacco abuse, depression, & seizures who presents to the ED via EMS s/p seizure this AM just PTA. Per patient she states she presumed she had a seizure this morning, but does not really remember the events that lead to this/onset. She is feeling as she typically does after a seizure. She also had a seizure late last night where she hit her head leading to a black eye & a wound to the inner lower lip. Does not believe she hit her had this AM but is not sure. She is not in any pain @ present. Denies change in vision, dizziness, numbness, weakness, chest pain, dyspnea, fever, or chills. She states she takes 3 medicines for her seizures, notes that she ran out of one of the medications yesterday after her morning dose therefore she did not have her evening dose yesterday or morning dose today. She has been taking the other two medicines as prescribed. She is unsure which medicine she ran out of, but it is one that she takes once in the AM & twice in the PM. Prior to last 24 hours her last seizure was about 2 weeks ago and was not prompted by missing any medicines- she called her neurologist & let them know.   Per EMS to triage team: Patient has hx of seizures, ran out of her seizures medicines yesterday, daughter witnessed seizure & called 911. Had a seizure yesterday as well. Post-ictal upon EMS arrival. Antiepileptics include oxtellar, zonixamide, & divalproex. Per EMS bottle of oxtellar was empty.      HPI  Past Medical History:  Diagnosis Date   Back pain    Chronic headaches    Depression    Genital warts    herpes   Herpes    Seizures (HCC) 03/15/2016    Patient Active Problem List   Diagnosis Date Noted   PCR DNA positive for HSV2  07/25/2018   Localization-related (focal) (partial) idiopathic epilepsy and epileptic syndromes with seizures of localized onset, intractable, without status epilepticus (HCC) 11/17/2017   Seizure (HCC) 11/19/2015   Amenorrhea 09/28/2011   Vitamin D deficiency 09/06/2011   Headache 03/10/2011   Convulsions (HCC) 10/26/2008   NICOTINE ADDICTION 12/10/2007   Depression 12/10/2007    Past Surgical History:  Procedure Laterality Date   CESAREAN SECTION     x 3     OB History    Gravida  3   Para  3   Term      Preterm      AB      Living  3     SAB      TAB      Ectopic      Multiple      Live Births  3            Home Medications    Prior to Admission medications   Medication Sig Start Date End Date Taking? Authorizing Provider  dihydroergotamine (MIGRANAL) 4 MG/ML nasal spray Use 1 spray into each nostril. Do not tilt your head back, sniff through your nose, or blow your nose while spraying or immediately after. Use another spray into each nostril 15 minutes after the first sprays. No more than  4 sprays in one hour 01/14/19   Van ClinesAquino, Karen M, MD  divalproex (DEPAKOTE ER) 500 MG 24 hr tablet Take 1 tablet in AM, 2 tablets in PM 01/14/19   Van ClinesAquino, Karen M, MD  famciclovir (FAMVIR) 250 MG tablet Take 1 tablet (250 mg total) by mouth 2 (two) times daily. 07/23/18   Kerri PerchesSimpson, Margaret E, MD  medroxyPROGESTERone (DEPO-PROVERA) 150 MG/ML injection INJECT 1 ML IN THE MUSCLE ONCE EVERY 3 MONTHS AS DIRECTED 10/22/18   Adline PotterGriffin, Jennifer A, NP  methocarbamol (ROBAXIN) 500 MG tablet TAKE 1 TABLET(500 MG) BY MOUTH EVERY 8 HOURS AS NEEDED FOR MUSCLE SPASMS 11/30/18   Van ClinesAquino, Karen M, MD  naratriptan (AMERGE) 1 MG TABS tablet Take 1 tablet (1 mg total) by mouth once as needed for up to 1 dose. Take one (1) tablet at onset of headache DO NOT TAKE MORE than 3 tablets in a week 12/05/17   Van ClinesAquino, Karen M, MD  nortriptyline (PAMELOR) 10 MG capsule Take 1 capsule (10 mg total) by  mouth at bedtime. 12/08/18   Van ClinesAquino, Karen M, MD  OXTELLAR XR 600 MG TB24 Take 1 tablet in AM, 2 tablets in PM 12/08/18   Van ClinesAquino, Karen M, MD  zonisamide [facility](ZONEGRAN) 100 MG capsule Take 3 caps every night 12/08/18   Van ClinesAquino, Karen M, MD    Family History Family History  Problem Relation Age of Onset   Hypertension Mother     Social History Social History   Tobacco Use   Smoking status: Current Every Day Smoker    Packs/day: 1.00    Years: 10.00    Pack years: 10.00    Types: Cigarettes   Smokeless tobacco: Never Used  Substance Use Topics   Alcohol use: Yes    Alcohol/week: 2.0 standard drinks    Types: 2 Cans of beer per week    Comment: occasionally   Drug use: Yes    Types: Marijuana    Comment: occ. use per pt     Allergies   Morphine  Review of Systems Review of Systems  Constitutional: Negative for chills and fever.  HENT: Negative for congestion, ear pain and sore throat.   Eyes: Negative for visual disturbance.  Respiratory: Negative for cough and shortness of breath.   Cardiovascular: Negative for chest pain.  Gastrointestinal: Negative for abdominal pain, diarrhea and vomiting.  Genitourinary: Negative for dysuria, frequency and urgency.  Skin: Positive for color change (R periorbital bruising) and wound.  Neurological: Positive for seizures. Negative for dizziness, facial asymmetry, speech difficulty, weakness, light-headedness, numbness and headaches.  All other systems reviewed and are negative.    Physical Exam Updated Vital Signs BP 94/73 (BP Location: Right Arm)    Pulse 82    Temp 98.7 F (37.1 C) (Oral)    Resp 18    Ht 5\' 1"  (1.549 m)    Wt 63.5 kg    SpO2 96%    BMI 26.45 kg/m   Physical Exam Vitals signs and nursing note reviewed.  Constitutional:      General: She is not in acute distress.    Appearance: She is well-developed. She is not toxic-appearing.  HENT:     Head: Normocephalic. No Battle's sign.     Right Ear: No  hemotympanum.     Left Ear: No hemotympanum.     Nose: Nose normal.     Mouth/Throat:     Pharynx: Uvula midline.     Comments: Abrasion to central inner lower lip.  Abrasion  to left side of tongue.  No active bleeding.  Eyes:     General:        Right eye: No discharge.        Left eye: No discharge.     Extraocular Movements: Extraocular movements intact.     Conjunctiva/sclera: Conjunctivae normal.     Right eye: No hemorrhage.    Left eye: No hemorrhage.    Comments: L periorbital ecchymosis & tenderness to palpation.  No obvious hyphema.   Neck:     Musculoskeletal: Normal range of motion and neck supple. No spinous process tenderness.  Cardiovascular:     Rate and Rhythm: Normal rate and regular rhythm.  Pulmonary:     Effort: Pulmonary effort is normal. No respiratory distress.     Breath sounds: Normal breath sounds. No wheezing, rhonchi or rales.  Abdominal:     General: There is no distension.     Palpations: Abdomen is soft.     Tenderness: There is no abdominal tenderness.  Musculoskeletal:     Comments: Moving all extremities w/o point/focal bony tenderness No midline spinal tenderness.   Skin:    General: Skin is warm and dry.     Findings: No rash.  Neurological:     Comments: Alert. Clear speech. No facial droop. CNIII-XII grossly intact. Bilateral upper and lower extremities' sensation grossly intact. 5/5 symmetric strength with grip strength and with plantar and dorsi flexion bilaterally. Normal finger to nose bilaterally. Negative pronator drift.   Psychiatric:        Behavior: Behavior normal.    ED Treatments / Results  Labs (all labs ordered are listed, but only abnormal results are displayed) Labs Reviewed  BASIC METABOLIC PANEL - Abnormal; Notable for the following components:      Result Value   CO2 17 (*)    Creatinine, Ser 1.02 (*)    Calcium 8.7 (*)    All other components within normal limits  CBC WITH DIFFERENTIAL/PLATELET - Abnormal;  Notable for the following components:   MCV 101.5 (*)    All other components within normal limits  PHENYTOIN LEVEL, TOTAL - Abnormal; Notable for the following components:   Phenytoin Lvl <2.5 (*)    All other components within normal limits  VALPROIC ACID LEVEL - Abnormal; Notable for the following components:   Valproic Acid Lvl 33 (*)    All other components within normal limits  HCG, SERUM, QUALITATIVE    EKG    Radiology Ct Head Wo Contrast  Result Date: 01/14/2019 CLINICAL DATA:  Head trauma, seizure, headache EXAM: CT HEAD WITHOUT CONTRAST CT MAXILLOFACIAL WITHOUT CONTRAST TECHNIQUE: Multidetector CT imaging of the head and maxillofacial structures were performed using the standard protocol without intravenous contrast. Multiplanar CT image reconstructions of the maxillofacial structures were also generated. COMPARISON:  03/22/2018 FINDINGS: CT HEAD FINDINGS Brain: No evidence of acute infarction, hemorrhage, hydrocephalus, extra-axial collection or mass lesion/mass effect. Vascular: No hyperdense vessel or unexpected calcification. CT FACIAL BONES FINDINGS Skull: Normal. Negative for fracture or focal lesion. Facial bones: No displaced fractures or dislocations. Sinuses/Orbits: No acute finding. Other: Soft tissue edema about the right orbit and forehead. IMPRESSION: 1.  No acute intracranial pathology. 2.  No displaced fracture or dislocation of the facial bones. Electronically Signed   By: Lauralyn Primes M.D.   On: 01/14/2019 10:54   Ct Maxillofacial Wo Cm  Result Date: 01/14/2019 CLINICAL DATA:  Head trauma, seizure, headache EXAM: CT HEAD WITHOUT CONTRAST CT MAXILLOFACIAL WITHOUT CONTRAST  TECHNIQUE: Multidetector CT imaging of the head and maxillofacial structures were performed using the standard protocol without intravenous contrast. Multiplanar CT image reconstructions of the maxillofacial structures were also generated. COMPARISON:  03/22/2018 FINDINGS: CT HEAD FINDINGS Brain: No  evidence of acute infarction, hemorrhage, hydrocephalus, extra-axial collection or mass lesion/mass effect. Vascular: No hyperdense vessel or unexpected calcification. CT FACIAL BONES FINDINGS Skull: Normal. Negative for fracture or focal lesion. Facial bones: No displaced fractures or dislocations. Sinuses/Orbits: No acute finding. Other: Soft tissue edema about the right orbit and forehead. IMPRESSION: 1.  No acute intracranial pathology. 2.  No displaced fracture or dislocation of the facial bones. Electronically Signed   By: Lauralyn PrimesAlex  Bibbey M.D.   On: 01/14/2019 10:54    Procedures Procedures (including critical care time)  Medications Ordered in ED Medications  valproate (DEPACON) 1,250 mg in dextrose 5 % 50 mL IVPB (1,250 mg Intravenous New Bag/Given 01/14/19 1125)     Initial Impression / Assessment and Plan / ED Course  I have reviewed the triage vital signs and the nursing notes.  Pertinent labs & imaging results that were available during my care of the patient were reviewed by me and considered in my medical decision making (see chart for details).   Patient presents to the ED w/ via EMS s/p seizure this AM, also had seizure last night in setting of being out of one of her anti-epileptics. Per discussion with patient, EMS report, & [facility] is confirmed that she is out of her Oxtellar ER medication, also through discussion w/ pharmacy it appears that her neurologist has increased her dose of Depakote ER from 500 mg BID to 500 mg 1 tablet in the AM & 2 tablets in the PM. No changes to other medicines. On my assessment patient is non toxic appearing, no apparent distress, vitals without significant abnormality. She has a normal neuro exam w/o focal deficits. She does have some R periorbital ecchymosis/tendenress w/ EOMI as well as some abrasions to the lower lip& tongue without active bleeding & w/o appearance of need for  intervention. Will check basic labs, CT head/max face, & monitor. Initial plan to administer her Oxtellar in the ED but we unfortunately do not have this medication within our pharmacy therefore this ordered was dicontinued. She has had her other two anti-epileptics this AM.   CBC: No leukocytosis or significant anemia.  BMP:  Mildly low bicarb w/ mild creatinine elevation similar to prior. Mild hypocalcemia, no significant electrolyte derangement.  preg test: negative Dilantin level initially ordered initially by mistake, subtherapeutic but patient does not take this medicine therefore med for loading dose discontinued, valproic acid level (depakote) ordered as this is patient's prescribed medicine.  EKG: No STEMI  CT head: No acute intracranial abnormalities CT maxillofacial: No fx/dislocation.   Valproic acid level (depakote): 33- subtherapeutic.  Will administer loading dose of Depakote.   Patient has remained well appearing. No re-occurrence of seizure activity in the ED. Seizures within past 24 hours likely due to subtherapeutic anti-epileptics. Called & confirmed w/ pharmacy that she has refills available & ready for pick up. Will discharge home w/ close neurology follow up, discussed not driving until cleared by neurology. I discussed results, treatment plan, need for follow-up, and return precautions with the patient. Provided opportunity for questions, patient confirmed understanding and is in agreement with plan.   This is a shared visit with supervising physician Dr. Jodi MourningZavitz who has independently evaluated patient &  is in agreement.    Final Clinical Impressions(s) / ED Diagnoses   Final diagnoses:  Seizure [facility])    ED Discharge Orders    None       Amaryllis Dyke, PA-C 01/14/19 1243    Elnora Morrison, MD 01/16/19 [phone]

## 2019-01-14 NOTE — ED Notes (Signed)
cbg per ems was 119

## 2019-01-18 NOTE — Progress Notes (Signed)
Resubmitted on Cover My Meds with corrected Dx G43.009 and clinical questions. Will check back later for determination

## 2019-01-19 ENCOUNTER — Telehealth: Payer: Self-pay

## 2019-01-19 NOTE — Telephone Encounter (Signed)
PA was approved for Dihydroergotmaine mesylate 4mg/ml   01/15/2019 until further notice  8mls per 30 days  Reference number [id number] [employer] [phone]  Pt called and informed of medication approval  

## 2019-01-19 NOTE — Progress Notes (Signed)
PA was approved for Dihydroergotmaine mesylate 4mg/ml   01/15/2019 until further notice  8mls per 30 days  Reference number [id number] [employer] [phone]  Pt called and informed of medication approval  

## 2019-01-20 ENCOUNTER — Telehealth: Payer: Self-pay

## 2019-01-20 ENCOUNTER — Encounter: Payer: Self-pay | Admitting: Neurology

## 2019-01-20 ENCOUNTER — Other Ambulatory Visit: Payer: Self-pay

## 2019-01-20 ENCOUNTER — Ambulatory Visit (INDEPENDENT_AMBULATORY_CARE_PROVIDER_SITE_OTHER): Payer: Medicare Other | Admitting: Neurology

## 2019-01-20 VITALS — BP 120/82 | HR 80 | Ht 61.0 in | Wt 157.0 lb

## 2019-01-20 DIAGNOSIS — G40019 Localization-related (focal) (partial) idiopathic epilepsy and epileptic syndromes with seizures of localized onset, intractable, without status epilepticus: Secondary | ICD-10-CM | POA: Diagnosis not present

## 2019-01-20 DIAGNOSIS — G43009 Migraine without aura, not intractable, without status migrainosus: Secondary | ICD-10-CM | POA: Diagnosis not present

## 2019-01-20 NOTE — Telephone Encounter (Signed)
Appt scheduled 03/15/19 at 3 with Dr Edwena Blow.  Pt is aware of appt and states that she does have NP paperwork.

## 2019-01-20 NOTE — Patient Instructions (Signed)
1. Take Depakote ER 500mg : 1 tablet in AM, 2 tablets in PM  2. Take Oxtellar XR 600mg : 1 tablet in AM, 2 tablets in PM  3. Take Zonisamide 100mg : 3 capsules every night  4. We will follow-up on the referral to [facility]   5. Follow-up as scheduled in August, call for any changes  Seizure Precautions: 1. If medication has been prescribed for you to prevent seizures, take it exactly as directed.  Do not stop taking the medicine without talking to your doctor first, even if you have not had a seizure in a long time.   2. Avoid activities in which a seizure would cause danger to yourself or to others.  Don't operate dangerous machinery, swim alone, or climb in high or dangerous places, such as on ladders, roofs, or girders.  Do not drive unless your doctor says you may.  3. If you have any warning that you may have a seizure, lay down in a safe place where you can't hurt yourself.    4.  No driving for 6 months from last seizure, as per [facility].   Please refer to the following link on the [place] website for more information: http://[email or web address]/[email or web address]/driving/drivingu.cfm   5.  Maintain good sleep hygiene. Avoid alcohol  6.  Notify your neurology if you are planning pregnancy or if you become pregnant.  7.  Contact your doctor if you have any problems that may be related to the medicine you are taking.  8.  Call 911 and bring the patient back to the ED if:        A.  The seizure lasts longer than 5 minutes.       B.  The patient doesn't awaken shortly after the seizure  C.  The patient has new problems such as difficulty seeing, speaking or moving  D.  The patient was injured during the seizure  E.  The patient has a temperature over 102 F (39C)  F.  The patient vomited and now is having trouble breathing

## 2019-01-20 NOTE — Progress Notes (Signed)
NEUROLOGY FOLLOW UP OFFICE NOTE  [name] [id number] 1980/10/31  HISTORY OF PRESENT ILLNESS: I had the pleasure of seeing [name] in follow-up in the neurology clinic on 01/20/2019.  The patient was last seen 6 weeks ago for intractable epilepsy. She was in the hospital last week due to a cluster of seizures. She had 2 seizures at home and called to report a post-ictal headache unrelieved with sumatriptan nasal spray. She sustained a black eye on the right and bit her tongue and inner cheek. She had a third seizure and was brought to [facility] where she reported that she ran out of medication the day prior and was unable to take 2 doses of her medications. Depakote level was 33, she was instructed to increase Depakote ER 500mg  to 1 in AM, 2 in PM. She is also on Oxtellar XR 600mg  in AM, 1200mg  in PM and Zonisamide 300mg  qhs. She is on nortriptyline 10mg  qhs for headache prophylaxis, and Migranal nasal spray has been sent to her pharmacy. She brings her seizure calendar, aside from the seizures on 6/24-6/25, she had one on 5/19. She is tearful in the office today reporting memory loss, she forgets even her phone number. She is getting more depressed and frustrated with continued seizures and increasing pills. She had been referred to the [facility] for presurgical evaluation but has not heard back yet.  History from Initial Assessment 11/14/2017: This is a 38 year old right-handed woman with a history of migraines, back pain, and seizures, presenting to establish care. Seizures started around 2010 when she started having nocturnal convulsions with tongue bite and urinary incontinence. She would wake up with her children telling her what happened. She would be slightly confused but back to baseline quickly. She was on Keppra and Lamictal with no change in seizure frequency. At that time she was also drinking heavily, but even with decrease in alcohol intake, had not noticed any change in  frequency. She was admitted to [facility] for 7 days in October 2012, typical episodes were not captured. EMU report unavailable for review, on discharge summary it was noted there were incidental left-sided sharp waves of which there may or ma not be a correlation to her spells. Keppra stopped and Lamictal dose increased. She was seeing neurologist Dr. Gerilyn Pilgrimoonquah, records unavailable for review, but it appears Fycompa was added to Lamictal. She reported worsening of seizures, now occurring in wakefulness, and was admitted for another 7-day [facility]U stay at [facility] in January 2018, again typical events were not captured. Baseline EEG was abnormal with intermittent left anterior to midtemporal slow waves, sharply contoured bursts of theta slowing over the left temporal region, as well as intermittent right temporal theta and delta slowing. There were also frequent 3-4 Hz very brief intermediate duration fluctuating runs of rhythmic delta activities (LRDA) maximal at F7. Occasional very brief runs were also seen maximal at F8.There were occasional independent left temporal and right temporal sharp waves. At times left temporal discharges have a wide field. There are ER visits in April, May, October, November 2018 for seizures, medications listed included Vimpat, Fycompa, and Oxtellar. Her last ER visit was on 11/05/17, taking Oxtellar 1200mg  BID without side effects. She reports compliance to medication.  She brings a calendar of her seizures in 2019. She had 2 in January, 2 in February, 1 in March, 3 so far in April. The last seizure occurred on 11/11/17 out of sleep, she woke up with a lip bite.  She has had a lot of her seizures occur while playing at the [facility]. Family has witnessed the seizures, describing staring/unresponsiveness with lip smacking, then convulsions with head turn to the right. She denies any prior warning symptoms, no post-ictal focal weakness. Her entire body feels sore. No clear seizure  triggers, she denies any sleep deprivation or missed medications. She has significantly cut down on alcohol, drinking 1 beer on the weekend.   She denies any olfactory/gustatory hallucinations, deja vu, rising epigastric sensation, focal numbness/tingling/weakness, myoclonic jerks. She has migraines after a seizure and had a prescription for Norco. She also has Zipsor and Robaxin to take after seizures. She denies any dizziness, diplopia, dysarthria/dysphagia, neck/back pain, bowel/bladder dysfunction.   Epilepsy Risk Factors:  Her brother had seizures in childhood. She had a normal birth and early development.  There is no history of febrile convulsions, CNS infections such as meningitis/encephalitis, significant traumatic brain injury, neurosurgical procedures, or family history of seizures. She has a scar over the left temporal region after hitting her head on a window, she denies any loss of consciousness with this.   Prior AEDs: Lamictal, Keppra, Fycompa, Vimpat Laboratory Data:  EEGs: Her EEG at [facility] had shown bilateral temporal slowing, as well as rhythmic delta at F7 and F8, independent left and right temporal sharp waves. MRI: MRI brain with and without contrast in 10/2008 was unremarkable with scatted white matter changes I personally reviewed MRI brain with and without contrast done 11/17/17 which did not show any acute changes, hippocampi symmetric with no abnormal signal or enhancement seen. There were several white matter changes bilaterally with no abnormal enhancement.  PAST MEDICAL HISTORY: Past Medical History:  Diagnosis Date  . Back pain   . Chronic headaches   . Depression   . Genital warts    herpes  . Herpes   . Seizures ([place]) 03/15/2016    MEDICATIONS: Current Outpatient Medications on File Prior to Visit  Medication Sig Dispense Refill  . divalproex (DEPAKOTE ER) 500 MG 24 hr tablet Take 1 tablet in AM, 2 tablets in PM 90 tablet 11  . famciclovir (FAMVIR) 250  MG tablet Take 1 tablet (250 mg total) by mouth 2 (two) times daily. 60 tablet 5  . medroxyPROGESTERone (DEPO-PROVERA) 150 MG/ML injection INJECT 1 ML IN THE MUSCLE ONCE EVERY 3 MONTHS AS DIRECTED 1 mL 3  . methocarbamol (ROBAXIN) 500 MG tablet Take 500 mg by mouth every 6 (six) hours as needed for muscle spasms.    . naratriptan (AMERGE) 1 MG TABS tablet Take 1 tablet (1 mg total) by mouth once as needed for up to 1 dose. Take one (1) tablet at onset of headache DO NOT TAKE MORE than 3 tablets in a week 10 tablet 3  . nortriptyline (PAMELOR) 10 MG capsule Take 1 capsule (10 mg total) by mouth at bedtime. 30 capsule 11  . OXTELLAR XR 600 MG TB24 Take 1 tablet in AM, 2 tablets in PM 90 tablet 11  . zonisamide (ZONEGRAN) 100 MG capsule Take 3 caps every night 90 capsule 11   No current facility-administered medications on file prior to visit.     ALLERGIES: Allergies  Allergen Reactions  . Morphine Other (See Comments)    Unknown Reaction  unknown    FAMILY HISTORY: Family History  Problem Relation Age of Onset  . Hypertension Mother     SOCIAL HISTORY: Social History   Socioeconomic History  . Marital status: Single    Spouse name: Not  on file  . Number of children: 2  . Years of education: 3210, GED  . Highest education level: Not on file  Occupational History    Comment: na  Social Needs  . Financial resource strain: Not on file  . Food insecurity    Worry: Not on file    Inability: Not on file  . Transportation needs    Medical: Not on file    Non-medical: Not on file  Tobacco Use  . Smoking status: Current Every Day Smoker    Packs/day: 1.00    Years: 10.00    Pack years: 10.00    Types: Cigarettes  . Smokeless tobacco: Never Used  Substance and Sexual Activity  . Alcohol use: Yes    Alcohol/week: 2.0 standard drinks    Types: 2 Cans of beer per week    Comment: occasionally  . Drug use: Yes    Types: Marijuana    Comment: occ. use per pt  . Sexual  activity: Not Currently    Birth control/protection: Injection  Lifestyle  . Physical activity    Days per week: Not on file    Minutes per session: Not on file  . Stress: Not on file  Relationships  . Social Musicianconnections    Talks on phone: Not on file    Gets together: Not on file    Attends religious service: Not on file    Active member of club or organization: Not on file    Attends meetings of clubs or organizations: Not on file    Relationship status: Not on file  . Intimate partner violence    Fear of current or ex partner: Not on file    Emotionally abused: Not on file    Physically abused: Not on file    Forced sexual activity: Not on file  Other Topics Concern  . Not on file  Social History Narrative   Lives  With 2 children in home   Caffeine -Pepsi 2 daily    REVIEW OF SYSTEMS: Constitutional: No fevers, chills, or sweats, no generalized fatigue, change in appetite Eyes: No visual changes, double vision, eye pain Ear, nose and throat: No hearing loss, ear pain, nasal congestion, sore throat Cardiovascular: No chest pain, palpitations Respiratory:  No shortness of breath at rest or with exertion, wheezes GastrointestinaI: No nausea, vomiting, diarrhea, abdominal pain, fecal incontinence Genitourinary:  No dysuria, urinary retention or frequency Musculoskeletal:  No neck pain, back pain Integumentary: No rash, pruritus, skin lesions Neurological: as above Psychiatric: + depression, insomnia, anxiety Endocrine: No palpitations, fatigue, diaphoresis, mood swings, change in appetite, change in weight, increased thirst Hematologic/Lymphatic:  No anemia, purpura, petechiae. Allergic/Immunologic: no itchy/runny eyes, nasal congestion, recent allergic reactions, rashes  PHYSICAL EXAM: Vitals:   01/20/19 1120  BP: 120/82  Pulse: 80  SpO2: 98%   General: No acute distress, she has a healing right periorbital hematoma, tearful Head:  Normocephalic/atraumatic  Skin/Extremities: No rash, no edema Neurological Exam: alert and oriented to person, place, and time. No aphasia or dysarthria. Fund of knowledge is appropriate.  Recent and remote memory are impaired.  Attention and concentration are normal.    Able to name objects and repeat phrases. Cranial nerves: Pupils equal, round, reactive to light. Extraocular movements intact with no nystagmus. Visual fields full. Facial sensation intact. No facial asymmetry. Tongue, uvula, palate midline, healing wound on right side of tongue.  Motor: Bulk and tone normal, muscle strength 5/5 throughout with no pronator drift.  Finger  to nose testing intact.  Gait narrow-based and steady, able to tandem walk adequately.  Romberg negative. There was minimal left hand tremor noted during conversation, none during testing, no postural or endpoint tremor seen.  IMPRESSION: This is a 38 yo RH woman with a history of migraines and intractable epilepsy. Semiology suggestive of focal to bilateral tonic-clonic epilepsy arising from the left temporal region (staring/unresponsive with lip smacking followed by convulsion with head version to the right per family). She has had 2 prior 7-day EMU admissions at [facility] where typical events were not captured, however most recent baseline EEG showed bilateral temporal sharp waves and focal slowing. MRI brain no acute changes. She had a cluster of seizures last 6/24-6/25 and presents for an urgent follow-up. She will increase Depakote ER to 500mg  in AM, 1000mg  in PM, continue Oxtellar 600mg  in AM, 1200mg  in PM and Zonisamide 300mg  qhs. Continue nortriptyline for migraine prophylaxis, she has prn Migranal to try for post-ictal migraines. Proceed with presurgical evaluation at tertiary center. She does not drive. Continue seizure calendar, follow-up as scheduled next month, she knows to call for any changes.    Thank you for allowing me to participate in her care.  Please do not hesitate to call for  any questions or concerns.  The duration of this appointment visit was 25 minutes of face-to-face time with the patient.  Greater than 50% of this time was spent in counseling, explanation of diagnosis, planning of further management, and coordination of care.   Patrcia DollyKaren , M.D.   CC: Dr. Lodema HongSimpson

## 2019-01-21 NOTE — Progress Notes (Signed)
PA was approved for Dihydroergotmaine mesylate 4mg /ml   01/15/2019 until further notice  71mls per 30 days  Reference number [id number] [place] [phone]  Pt called and informed of medication approval

## 2019-02-08 ENCOUNTER — Telehealth: Payer: Self-pay | Admitting: Neurology

## 2019-02-08 NOTE — Telephone Encounter (Signed)
Patient is calling in about having dizzy spells and she is wanting to speak with a nurse about that. Please call her. Thanks!

## 2019-02-09 NOTE — Telephone Encounter (Signed)
Patient stated she had double vision Saturday - Monday but has not had any today. Slightly dizzy off and on but not today and she says she is mostly concerned about the double vision. She has not had any seizures, no missed medications, not sleep deprived, no alcohol intake. Next appt 8/14. Any recommendations?

## 2019-02-09 NOTE — Telephone Encounter (Signed)
It may be a side effect from her medications, but we have to weigh benefits (no seizures) versus side effects, if she is feeling okay today, would keep an eye on it, or maybe try spreading out her medications a little. Thanks

## 2019-02-09 NOTE — Telephone Encounter (Signed)
Follow up  Patient called back returning nurses call from voicemail.

## 2019-02-09 NOTE — Telephone Encounter (Signed)
Left message for patient to return call.

## 2019-02-10 NOTE — Telephone Encounter (Signed)
Left message for patient to return call.

## 2019-02-11 NOTE — Telephone Encounter (Signed)
Spoke to patient and she is feeling better (no more double vision). Gave her Dr. Amparo Levine recommendation of spacing out her meds a little - patient stated she already does that. She will call back if has any future problems and has a follow up 8/14 that she is aware of.

## 2019-02-19 ENCOUNTER — Other Ambulatory Visit: Payer: Self-pay | Admitting: Neurology

## 2019-02-22 ENCOUNTER — Ambulatory Visit: Payer: Medicare Other | Admitting: Family Medicine

## 2019-02-24 ENCOUNTER — Ambulatory Visit (INDEPENDENT_AMBULATORY_CARE_PROVIDER_SITE_OTHER): Payer: Medicare Other | Admitting: Family Medicine

## 2019-02-24 ENCOUNTER — Encounter (INDEPENDENT_AMBULATORY_CARE_PROVIDER_SITE_OTHER): Payer: Self-pay

## 2019-02-24 ENCOUNTER — Other Ambulatory Visit: Payer: Self-pay

## 2019-02-24 ENCOUNTER — Encounter: Payer: Self-pay | Admitting: Family Medicine

## 2019-02-24 ENCOUNTER — Other Ambulatory Visit (HOSPITAL_COMMUNITY)
Admission: RE | Admit: 2019-02-24 | Discharge: 2019-02-24 | Disposition: A | Discharge status: Home / Self Care | Payer: Medicare Other | Source: Ambulatory Visit | Attending: Family Medicine | Admitting: Family Medicine

## 2019-02-24 VITALS — BP 114/78 | HR 79 | Temp 98.3°F | Resp 15 | Ht 61.0 in | Wt 163.0 lb

## 2019-02-24 DIAGNOSIS — Z6830 Body mass index (BMI) 30.0-30.9, adult: Secondary | ICD-10-CM | POA: Diagnosis not present

## 2019-02-24 DIAGNOSIS — Z1151 Encounter for screening for human papillomavirus (HPV): Secondary | ICD-10-CM | POA: Diagnosis not present

## 2019-02-24 DIAGNOSIS — R569 Unspecified convulsions: Secondary | ICD-10-CM | POA: Diagnosis not present

## 2019-02-24 DIAGNOSIS — Z01419 Encounter for gynecological examination (general) (routine) without abnormal findings: Secondary | ICD-10-CM | POA: Diagnosis not present

## 2019-02-24 DIAGNOSIS — F17210 Nicotine dependence, cigarettes, uncomplicated: Secondary | ICD-10-CM | POA: Diagnosis not present

## 2019-02-24 DIAGNOSIS — Z124 Encounter for screening for malignant neoplasm of cervix: Secondary | ICD-10-CM | POA: Insufficient documentation

## 2019-02-24 DIAGNOSIS — Z79899 Other long term (current) drug therapy: Secondary | ICD-10-CM

## 2019-02-24 DIAGNOSIS — Z1231 Encounter for screening mammogram for malignant neoplasm of breast: Secondary | ICD-10-CM | POA: Diagnosis not present

## 2019-02-24 DIAGNOSIS — E559 Vitamin D deficiency, unspecified: Secondary | ICD-10-CM | POA: Diagnosis not present

## 2019-02-24 DIAGNOSIS — F17200 Nicotine dependence, unspecified, uncomplicated: Secondary | ICD-10-CM | POA: Diagnosis not present

## 2019-02-24 DIAGNOSIS — E66811 Obesity, class 1: Secondary | ICD-10-CM

## 2019-02-24 DIAGNOSIS — E669 Obesity, unspecified: Secondary | ICD-10-CM

## 2019-02-24 NOTE — Patient Instructions (Addendum)
F/U with MD in 4 months, call if you need me sooner  Pap today  Lipid panel, hepatc panel, tSH and vit d today  Mammogram to be scheduled at checkout please  Now smoking 1 PPD, please make the commitment to stop smoking sooner rather than later and start using the patches that you have  Best wishes for birhtday and ongoing treatment of your seizures

## 2019-02-25 ENCOUNTER — Telehealth: Payer: Self-pay | Admitting: *Deleted

## 2019-02-25 NOTE — Telephone Encounter (Signed)
Mom called and wanted to speak with a nurse now as patient was having a seizure. Asked questions about the seizure and the mom said she didn't know because she wasn't with her daughter. Her granddaughter (patient's 40 year old daughter) had called an ambulance and they were now with the patient. Reassured mom that patient was immediately being cared for if ambulance was there. Asked mom to have the patient (or family member if needed) to call us back with details this afternoon or when able and I will share that information with Dr. Delice Lesch.

## 2019-02-26 ENCOUNTER — Telehealth: Payer: Self-pay | Admitting: Neurology

## 2019-02-26 ENCOUNTER — Other Ambulatory Visit: Payer: Self-pay | Admitting: *Deleted

## 2019-02-26 ENCOUNTER — Other Ambulatory Visit: Payer: Self-pay | Admitting: Neurology

## 2019-02-26 MED ORDER — METHOCARBAMOL 500 MG PO TABS: 500.0000 mg | ORAL_TABLET | Freq: Four times a day (QID) | ORAL | 3 refills | Status: DC | PRN

## 2019-02-26 MED ORDER — DIVALPROEX SODIUM ER 500 MG PO TB24: ORAL_TABLET | ORAL | 1 refills | Status: DC

## 2019-02-26 NOTE — Telephone Encounter (Signed)
Pls send in Rx for Robaxin, and also pls have her increase the Depakote 500mg : Take 2 tabs BID. I see she has an appt at the [facility] on 8/24, let's just move her f/u with me until after her [place] visit, I have an opening on 8/27 at 10am, if she is agreeable. Thanks!

## 2019-02-26 NOTE — Telephone Encounter (Signed)
Pls send for 30 tabs with 3 refills, thanks

## 2019-02-26 NOTE — Telephone Encounter (Signed)
Talked with patient and informed her MD wants her to increase Depakote to 500mg  2 tabs BID. Robaxin 500mg  Q6 prn and the Depakote sent into [facility] in [place].   Informed patient that MD would like to move her follow up to after her appt at [facility]. Patient stated she will be able to do a virtual visit 8/27 at 10am. Appt moved from 8/14 to 8/27 at 10 am.

## 2019-02-26 NOTE — Telephone Encounter (Signed)
Pt c/o: seizure yesterday Missed medications?  No. Sleep deprived?  No. Alcohol intake?  No. Back to their usual baseline self?  Yes.  . If no, advise go to ER Current medications prescribed by Dr. Delice Lesch:   Zonegran 100 mg 3 tabs QHS  Oxtellar XR 600 mg 1 tab am ; 2 tabs pm  Depakote ER 500 mg 1 tab am ; 2 tabs pm  Nortriptylline 10 mg 1 tab QHS   States ambulance was called yesterday when she had a seizure and she was told she fell off the bed and she bit the side of her mouth. Her "bones are sore" today and she is requesting a muscle relaxer. States nothing over the counter works for this pain. Per chart our office prescribed methocarbamol(Robaxin) 7/1 for same symptom but patient stated when she went to pick it up the pharmacy stated the script didn't go through and she was unable to get it. Informed patient if that happens again to please have the pharmacy call us or she can call us as we want to make sure she is able to get her prescribed meds.  Prior order was Robaxin 500mg  every 6 hours as needed for muscle spasms. Patient is requesting this be sent in or whatever MD recommends. Thanks.

## 2019-02-26 NOTE — Telephone Encounter (Signed)
Patient is calling in about having a seizure yesterday and now her bones are sore. She is wanting a muscle relaxor. Pharm on file is correct. Thanks!

## 2019-02-26 NOTE — Telephone Encounter (Signed)
Robaxin 500 mg Q 6 hours prn   How many pills to order with this script?   Will call her and discuss moving appt with you.

## 2019-02-28 ENCOUNTER — Encounter: Payer: Self-pay | Admitting: Family Medicine

## 2019-02-28 DIAGNOSIS — Z01419 Encounter for gynecological examination (general) (routine) without abnormal findings: Secondary | ICD-10-CM | POA: Insufficient documentation

## 2019-02-28 NOTE — Assessment & Plan Note (Signed)
Asked:confirms currently smokes cigarettes, 1 PPD Assess: Unwilling to quit but will be attempting to cut back Advise: needs to QUIT to reduce risk of cancer, cardio and cerebrovascular disease Assist: counseled for 5 minutes and literature provided Arrange: follow up in 3 months

## 2019-02-28 NOTE — Assessment & Plan Note (Signed)
Exam as documented and pap sent 

## 2019-02-28 NOTE — Assessment & Plan Note (Signed)
  Patient re-educated about  the importance of commitment to a  minimum of 150 minutes of exercise per week as able.  The importance of healthy food choices with portion control discussed, as well as eating regularly and within a 12 hour window most days. The need to choose "clean , green" food 50 to 75% of the time is discussed, as well as to make water the primary drink and set a goal of 64 ounces water daily.    Weight /BMI 02/24/2019 01/20/2019 01/14/2019  WEIGHT 163 lb 157 lb 140 lb  HEIGHT 5\' 1"  5\' 1"  5\' 1"   BMI 30.8 kg/m2 29.66 kg/m2 26.45 kg/m2

## 2019-02-28 NOTE — Assessment & Plan Note (Signed)
Still l uncontrolled with breakthrough seizures, upcoming appt at tertiary center , reportedly, followed by neurology

## 2019-02-28 NOTE — Progress Notes (Signed)
   [name]     MRN: [id number]      DOB: 04/15/1981   HPI [name] is here for follow up and re-evaluation of chronic medical conditions,  and review of any available recent lab and radiology data.  Preventive health is updated, specifically  Cancer screening and Immunization.   Still has uncontrolled seizures and states she has upcoming appt at [facility]. The PT denies any adverse reactions to current medications since the last visit.  C/o headaches , currently being treated by Neurology,   ROS Denies recent fever or chills. Denies sinus pressure, nasal congestion, ear pain or sore throat. Denies chest congestion, productive cough or wheezing. Denies chest pains, palpitations and leg swelling Denies abdominal pain, nausea, vomiting,diarrhea or constipation.   Denies dysuria, frequency, hesitancy or incontinence. Denies joint pain, swelling and limitation in mobility.  Denies depression, anxiety or insomnia. Denies skin break down or rash.   PE  BP 114/78   Pulse 79   Temp 98.3 F (36.8 C) (Temporal)   Resp 15   Ht 5\' 1"  (1.549 m)   Wt 163 lb (73.9 kg)   SpO2 98%   BMI 30.80 kg/m   Patient alert and oriented and in no cardiopulmonary distress.  HEENT: No facial asymmetry, EOMI,   oropharynx pink and moist.  Neck supple no JVD, no mass.  Chest: Clear to auscultation bilaterally.  CVS: S1, S2 no murmurs, no S3.Regular rate.  ABD: Soft non tender.  Pelvic: Normal external genitalia, no ulcers or skin rash noted Internal: normal  vaginal walls, no ulcers, physiologic d/c Uterus : not enl;arged, no cervical motion tenderness, no adnexal mass or tenderness  Ext: No edema  MS: Adequate ROM spine, shoulders, hips and knees.  Skin: Intact, no ulcerations or rash noted.  Psych: Good eye contact, normal affect. Memory intact not anxious or depressed appearing.  CNS: CN 2-12 intact, power,  normal throughout.no focal deficits noted.   Assessment & Plan   Convulsions ([place]) Still l uncontrolled with breakthrough seizures, upcoming appt at tertiary center , reportedly, followed by neurology   NICOTINE ADDICTION Asked:confirms currently smokes cigarettes, 1 PPD Assess: Unwilling to quit but will be attempting to cut back Advise: needs to QUIT to reduce risk of cancer, cardio and cerebrovascular disease Assist: counseled for 5 minutes and literature provided Arrange: follow up in 3 months   Encounter for cervical Pap smear with pelvic exam Exam as documented and pap sent   Obesity (BMI 30.0-34.9)  Patient re-educated about  the importance of commitment to a  minimum of 150 minutes of exercise per week as able.  The importance of healthy food choices with portion control discussed, as well as eating regularly and within a 12 hour window most days. The need to choose "clean , green" food 50 to 75% of the time is discussed, as well as to make water the primary drink and set a goal of 64 ounces water daily.    Weight /BMI 02/24/2019 01/20/2019 01/14/2019  WEIGHT 163 lb 157 lb 140 lb  HEIGHT 5\' 1"  5\' 1"  5\' 1"   BMI 30.8 kg/m2 29.66 kg/m2 26.45 kg/m2

## 2019-03-03 LAB — CYTOLOGY - PAP
Diagnosis: NEGATIVE
HPV: NOT DETECTED

## 2019-03-05 ENCOUNTER — Ambulatory Visit: Payer: Medicare Other | Admitting: Neurology

## 2019-03-14 ENCOUNTER — Other Ambulatory Visit: Payer: Self-pay | Admitting: Neurology

## 2019-03-15 ENCOUNTER — Encounter: Payer: Self-pay | Admitting: Family Medicine

## 2019-03-15 DIAGNOSIS — G40219 Localization-related (focal) (partial) symptomatic epilepsy and epileptic syndromes with complex partial seizures, intractable, without status epilepticus: Secondary | ICD-10-CM | POA: Diagnosis not present

## 2019-03-18 ENCOUNTER — Other Ambulatory Visit: Payer: Self-pay

## 2019-03-18 ENCOUNTER — Encounter: Payer: Self-pay | Admitting: Neurology

## 2019-03-18 ENCOUNTER — Telehealth (INDEPENDENT_AMBULATORY_CARE_PROVIDER_SITE_OTHER): Payer: Medicare Other | Admitting: Neurology

## 2019-03-18 VITALS — Ht 61.0 in | Wt 173.0 lb

## 2019-03-18 DIAGNOSIS — G43009 Migraine without aura, not intractable, without status migrainosus: Secondary | ICD-10-CM

## 2019-03-18 DIAGNOSIS — G40019 Localization-related (focal) (partial) idiopathic epilepsy and epileptic syndromes with seizures of localized onset, intractable, without status epilepticus: Secondary | ICD-10-CM

## 2019-03-18 MED ORDER — OXTELLAR XR 600 MG PO TB24: ORAL_TABLET | ORAL | 11 refills | Status: DC

## 2019-03-18 MED ORDER — DIVALPROEX SODIUM ER 250 MG PO TB24: ORAL_TABLET | ORAL | 11 refills | Status: DC

## 2019-03-18 MED ORDER — NORTRIPTYLINE HCL 10 MG PO CAPS: 10.0000 mg | ORAL_CAPSULE | Freq: Every day | ORAL | 11 refills | Status: DC

## 2019-03-18 MED ORDER — ZONISAMIDE 100 MG PO CAPS: ORAL_CAPSULE | ORAL | 3 refills | Status: DC

## 2019-03-18 NOTE — Progress Notes (Signed)
Virtual Visit via Video Note The purpose of this virtual visit is to provide medical care while limiting exposure to the novel coronavirus.    Consent was obtained for video visit:  Yes.   Answered questions that patient had about telehealth interaction:  Yes.   I discussed the limitations, risks, security and privacy concerns of performing an evaluation and management service by telemedicine. I also discussed with the patient that there may be a patient responsible charge related to this service. The patient expressed understanding and agreed to proceed.  Pt location: Home Physician Location: office Name of referring provider:  Fayrene Helper, MD I connected with [name] at patients initiation/request on 03/18/2019 at 10:00 AM EDT by video enabled telemedicine application and verified that I am speaking with the correct person using two identifiers. Pt MRN:  [id number] Pt DOB:  July 29, 1980 Video Participants:  [name]   History of Present Illness:  The patient was seen as a virtual video visit on 03/18/2019. She was last seen 6 weeks ago and presents for follow-up at the neurology clinic for intractable epilepsy. Since her last visit, she has been evaluated at the [facility] Epilepsy clinic and is scheduled for additional workup starting with EMU admission. Since her last visit, she reports a GTC on 8/6 and a "light one" on 8/21. She is currently on Oxtellar XR 600mg  1 tab in AM, 2 tabs in PM, Zonisamide 300mg  qhs, and Depakote ER 1000mg  BID. She denies any side effects on medications. She reports the headaches are okay, she is also on notriptyline 10mg  qhs. She notes difficulty swallowing the 500mg  Depakote tablets and is asking for a smaller pill size. She is in better spirits today and denies any dizziness, vision changes, no other falls aside from the seizure.  History from Initial Assessment 11/14/2017: This is a 38 year old right-handed woman with a history of migraines,  back pain, and seizures, presenting to establish care. Seizures started around 2010 when she started having nocturnal convulsions with tongue bite and urinary incontinence. She would wake up with her children telling her what happened. She would be slightly confused but back to baseline quickly. She was on Keppra and Lamictal with no change in seizure frequency. At that time she was also drinking heavily, but even with decrease in alcohol intake, had not noticed any change in frequency. She was admitted to [facility] for 7 days in October 2012, typical episodes were not captured. EMU report unavailable for review, on discharge summary it was noted there were incidental left-sided sharp waves of which there may or ma not be a correlation to her spells. Keppra stopped and Lamictal dose increased. She was seeing neurologist Dr. Merlene Laughter, records unavailable for review, but it appears Fycompa was added to Lamictal. She reported worsening of seizures, now occurring in wakefulness, and was admitted for another 7-day EMU stay at [facility] in January 2018, again typical events were not captured. Baseline EEG was abnormal with intermittent left anterior to midtemporal slow waves, sharply contoured bursts of theta slowing over the left temporal region, as well as intermittent right temporal theta and delta slowing. There were also frequent 3-4 Hz very brief intermediate duration fluctuating runs of rhythmic delta activities (LRDA) maximal at F7. Occasional very brief runs were also seen maximal at F8.There were occasional independent left temporal and right temporal sharp waves. At times left temporal discharges have a wide field. There are ER visits in April, May, October, November 2018 for  seizures, medications listed included Vimpat, Fycompa, and Oxtellar. Her last ER visit was on 11/05/17, taking Oxtellar 1200mg  BID without side effects. She reports compliance to medication.  She brings a calendar of her seizures in  2019. She had 2 in January, 2 in February, 1 in March, 3 so far in April. The last seizure occurred on 11/11/17 out of sleep, she woke up with a lip bite. She has had a lot of her seizures occur [facility] Family has witnessed the seizures, describing staring/unresponsiveness with lip smacking, then convulsions with head turn to the right. She denies any prior warning symptoms, no post-ictal focal weakness. Her entire body feels sore. No clear seizure triggers, she denies any sleep deprivation or missed medications. She has significantly cut down on alcohol, drinking 1 beer on the weekend.   She denies any olfactory/gustatory hallucinations, deja vu, rising epigastric sensation, focal numbness/tingling/weakness, myoclonic jerks. She has migraines after a seizure and had a prescription for Norco. She also has Zipsor and Robaxin to take after seizures. She denies any dizziness, diplopia, dysarthria/dysphagia, neck/back pain, bowel/bladder dysfunction.   Epilepsy Risk Factors:  Her brother had seizures in childhood. She had a normal birth and early development.  There is no history of febrile convulsions, CNS infections such as meningitis/encephalitis, significant traumatic brain injury, neurosurgical procedures, or family history of seizures. She has a scar over the left temporal region after hitting her head on a window, she denies any loss of consciousness with this.   Prior AEDs: Lamictal, Keppra, Fycompa, Vimpat Laboratory Data:  EEGs: [facility] shown bilateral temporal slowing, as well as rhythmic delta at F7 and F8, independent left and right temporal sharp waves. MRI: MRI brain with and without contrast in 10/2008 was unremarkable with scatted white matter changes I personally reviewed MRI brain with and without contrast done 11/17/17 which did not show any acute changes, hippocampi symmetric with no abnormal signal or enhancement seen. There were several white  matter changes bilaterally with no abnormal enhancement.     Current Outpatient Medications on File Prior to Visit  Medication Sig Dispense Refill  . divalproex (DEPAKOTE ER) 500 MG 24 hr tablet Take 2 tablets in AM, 2 tablets in PM 120 tablet 1  . famciclovir (FAMVIR) 250 MG tablet Take 1 tablet (250 mg total) by mouth 2 (two) times daily. 60 tablet 5  . medroxyPROGESTERone (DEPO-PROVERA) 150 MG/ML injection INJECT 1 ML IN THE MUSCLE ONCE EVERY 3 MONTHS AS DIRECTED 1 mL 3  . methocarbamol (ROBAXIN) 500 MG tablet Take 1 tablet (500 mg total) by mouth every 6 (six) hours as needed for muscle spasms. 30 tablet 3  . nortriptyline (PAMELOR) 10 MG capsule Take 1 capsule (10 mg total) by mouth at bedtime. 30 capsule 11  . OXTELLAR XR 600 MG TB24 Take 1 tablet in AM, 2 tablets in PM 90 tablet 11  . zonisamide (ZONEGRAN) 100 MG capsule TAKE 3 CAPSULES BY MOUTH EVERY NIGHT 90 capsule 3   No current facility-administered medications on file prior to visit.      Observations/Objective:   Vitals:   03/18/19 1002  Weight: 173 lb (78.5 kg)  Height: 5\' 1"  (1.549 m)   GEN:  The patient appears stated age and is in NAD.  Neurological examination: Patient is awake, alert, oriented x 3. No aphasia or dysarthria. Intact fluency and comprehension. Remote and recent memory intact. Able to name and repeat. Cranial nerves: Extraocular movements intact with no  nystagmus. No facial asymmetry. Motor: moves all extremities symmetrically, at least anti-gravity x 4. No incoordination on finger to nose testing. Gait: narrow-based and steady, able to tandem walk adequately. Negative Romberg test.  Assessment and Plan:   This is a 38 yo RH woman with a history of migraines and intractable epilepsy. Semiology suggestive of focal to bilateral tonic-clonic epilepsy arising from the left temporal region (staring/unresponsive with lip smacking followed by convulsion with head version to the right per family). She has had 2  prior 7-day EMU admissions at [facility] where typical events were not captured, however most recent baseline EEG showed bilateral temporal sharp waves and focal slowing. MRI brain no acute changes. She has been evaluated at the [facility] with plans for EMU admission and consideration as a possible surgical candidate. Continue current doses of AEDs, she is having difficulties swallowing Depakote ER 500mg  tablets and will try 250mg  tabs 4 tabs BID (10000mg  BID), continue Oxtellar 600mg  in AM, 1200mg  in PM and Zonisamide 300mg  qhs. Continue nortriptyline for migraine prophylaxis, she has prn Migranal to try for post-ictal migraines. She does not drive. Follow-up in 4 months, she knows to call for any changes.    Follow Up Instructions:   -I discussed the assessment and treatment plan with the patient. The patient was provided an opportunity to ask questions and all were answered. The patient agreed with the plan and demonstrated an understanding of the instructions.   The patient was advised to call back or seek an in-person evaluation if the symptoms worsen or if the condition fails to improve as anticipated.     Van Clines, MD

## 2019-03-21 IMAGING — CT CT HEAD W/O CM
3 series · 16 of 45 positions shown, 19 images · non-contrast
Comparison: 11/18/2016 CT head.  10/27/2008 MRI head.

CLINICAL DATA: 36 y/o F; headache, dizziness, blurred vision
starting today.

EXAM:
CT HEAD WITHOUT CONTRAST
TECHNIQUE: Contiguous axial images were obtained from the base of the skull
through the vertex without intravenous contrast.

[Series 2: head wo · axial · 0.39mm/px · z∈[+35,+150]mm · 10 of 28 slices shown, 13 images]
[im 3/28  brain]
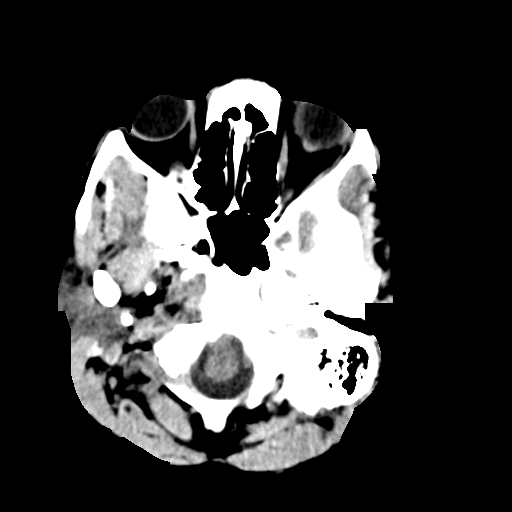
[im 3/28  bone]
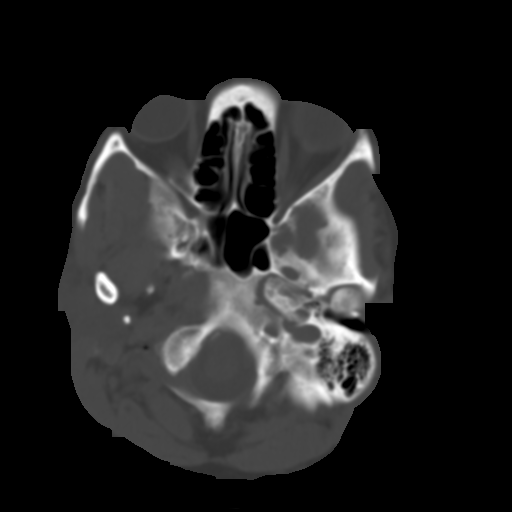
[im 5/28  brain]
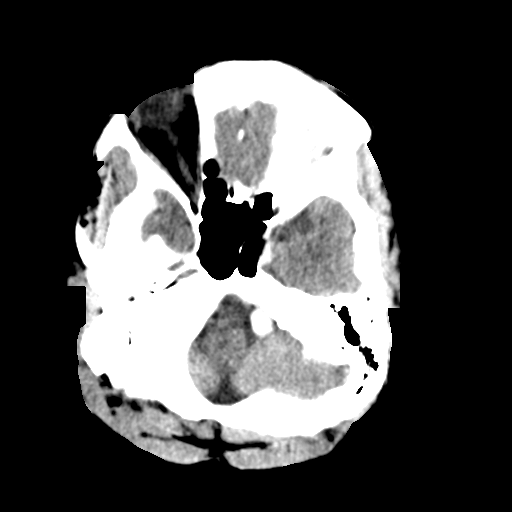
[im 8/28  brain]
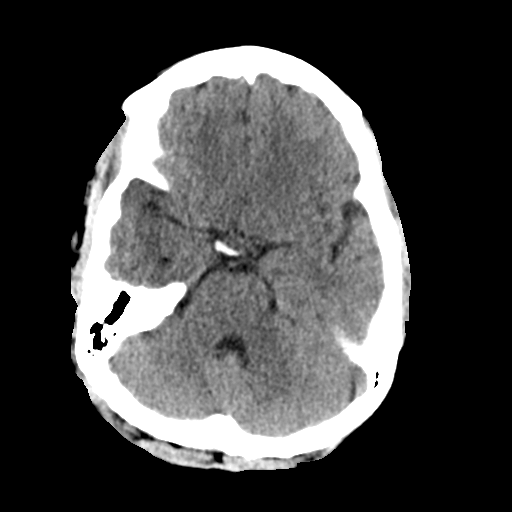
[im 11/28  brain]
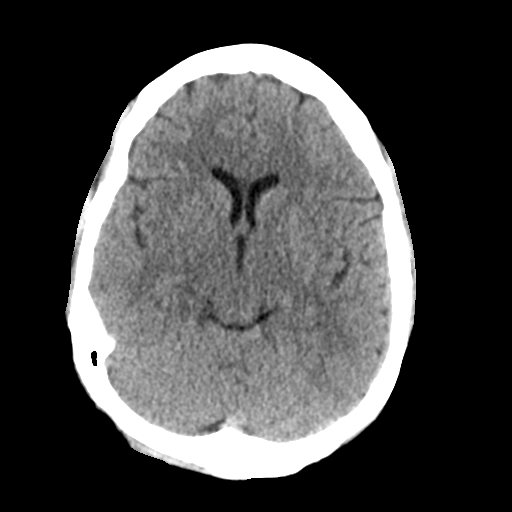
[im 13/28  brain]
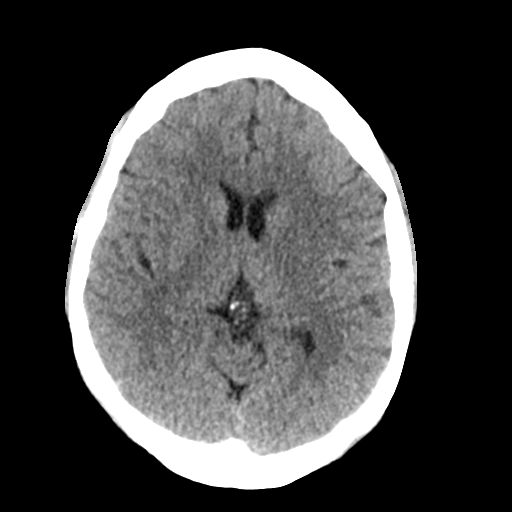
[im 13/28  bone]
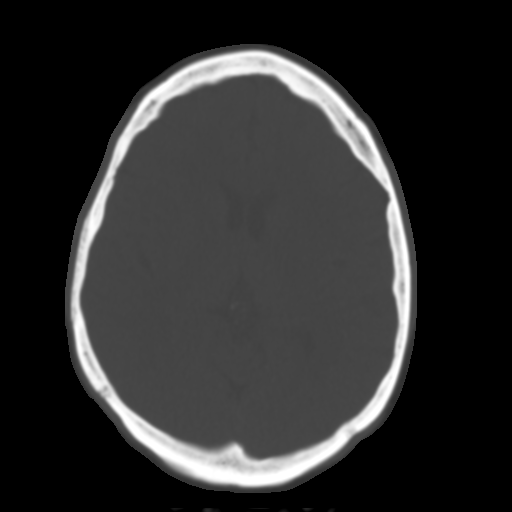
[im 16/28  brain]
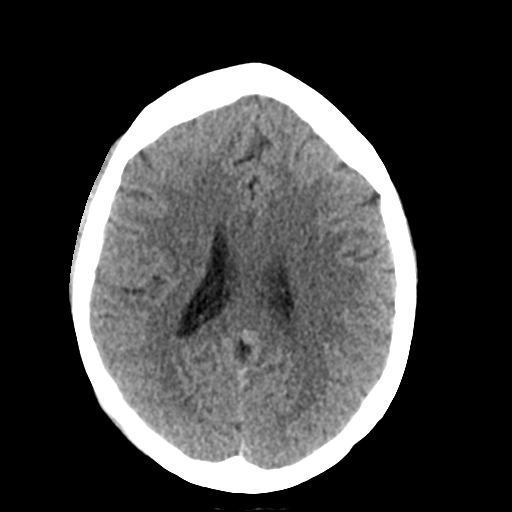
[im 18/28  brain]
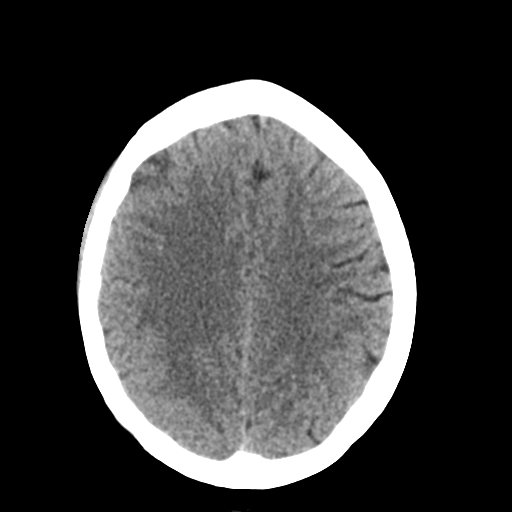
[im 21/28  brain]
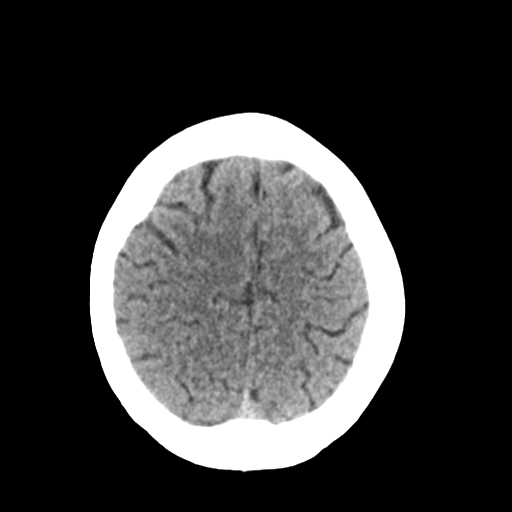
[im 24/28  brain]
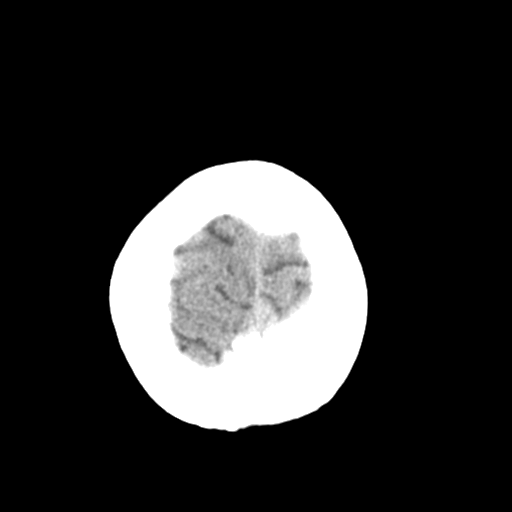
[im 24/28  bone]
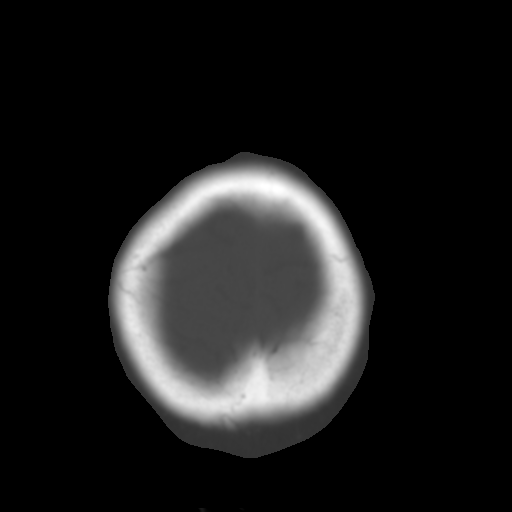
[im 26/28  brain]
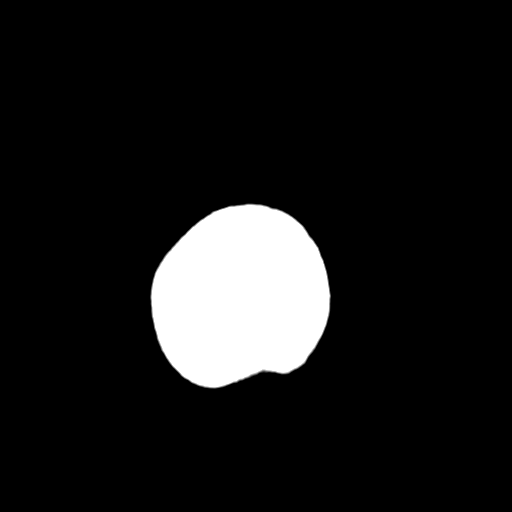

[Series 4: coronal soft tissue · coronal · 0.30mm/px · 3 of 67 slices shown]
[im 23/67  brain]
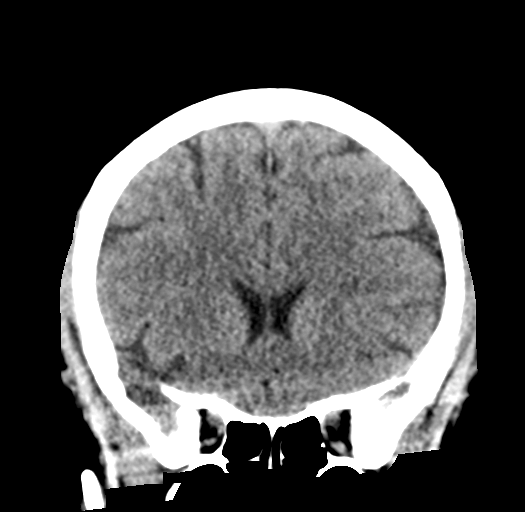
[im 30/67  brain]
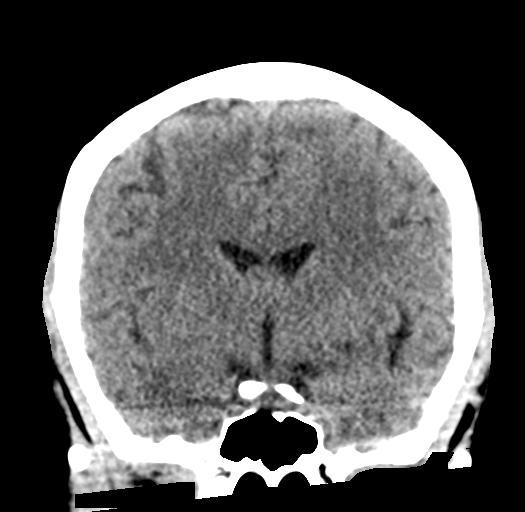
[im 37/67  brain]
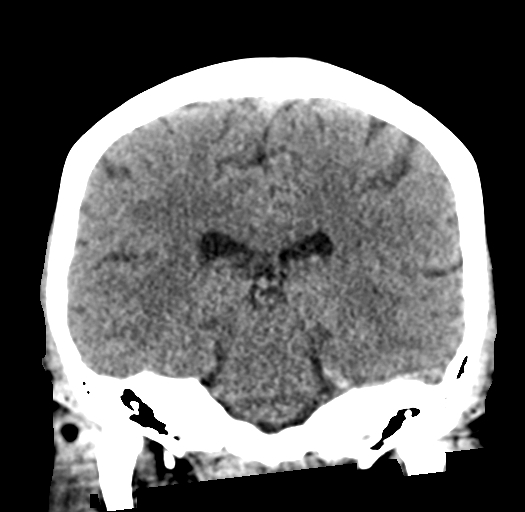

[Series 5: sagittal soft tissue · sagittal · 0.30mm/px · 3 of 56 slices shown]
[im 19/56  brain]
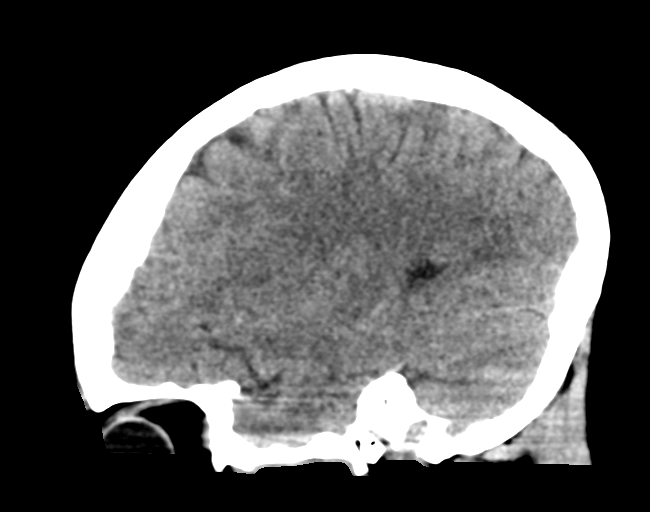
[im 28/56  brain]
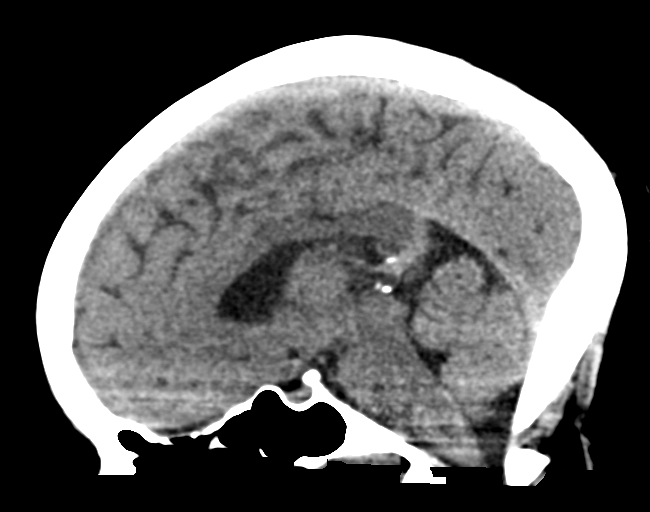
[im 37/56  brain]
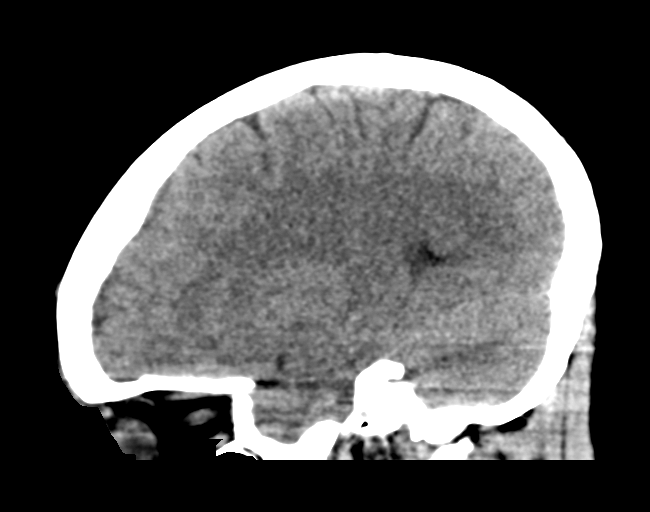

[16 of 45 positions shown; findings below may reference images not displayed]

FINDINGS: Brain: No evidence of acute infarction, hemorrhage, hydrocephalus,
extra-axial collection or mass lesion/mass effect. Few stable
nonspecific foci of hypoattenuation in subcortical white matter.

Vascular: No hyperdense vessel or unexpected calcification.

Skull: Normal. Negative for fracture or focal lesion.

Sinuses/Orbits: No acute finding.

Other: None.
IMPRESSION: Stable negative CT of the head.

By: Vidziune Aldona M.D.

## 2019-03-31 ENCOUNTER — Telehealth: Payer: Self-pay | Admitting: Neurology

## 2019-03-31 NOTE — Telephone Encounter (Signed)
Patient called requesting medication for a headache. She reports she had two seizures yesterday that resulted in two ambulance calls.  [facility] in [place]

## 2019-03-31 NOTE — Telephone Encounter (Signed)
Pt states that none of her migraine medications are helping.  Her head has hurt continuously since her second seizure yesterday.  Pt admits to not sleeping well for the past few nights but normally sleeps fine.  No new changes in her life, increased anxiety, no alcohol use.  Her pain is all over her head. She does not know how long her seizures lasted. She does not think she hit her head. Denies N&V.

## 2019-04-01 NOTE — Telephone Encounter (Signed)
Left message for pt to return call.

## 2019-04-01 NOTE — Telephone Encounter (Signed)
Has she taken the Migranal nasal spray? If that has not helped, the only other thing we can do is her come to the office for a Toradol injection.

## 2019-04-02 ENCOUNTER — Telehealth: Payer: Self-pay | Admitting: Obstetrics & Gynecology

## 2019-04-02 NOTE — Telephone Encounter (Signed)
Called patient regarding appointment scheduled in our office encouraged to come alone to the visit if possible, however, a support person, over age 40, may accompany her  to appointment if assistance is needed for safety or care concerns. Otherwise, support persons should remain outside until the visit is complete.  ° °We ask if you have had any exposure to anyone suspected or confirmed of having COVID-19 or if you are experiencing any of the following, to call and reschedule your appointment: fever, cough, shortness of breath, muscle pain, diarrhea, rash, vomiting, abdominal pain, red eye, weakness, bruising, bleeding, joint pain, or a severe headache.  ° °Please know we will ask you these questions or similar questions when you arrive for your appointment and again it’s how we are keeping everyone safe.   ° °Also,to keep you safe, please use the provided hand sanitizer when you enter the office. We are asking everyone in the office to wear a mask to help prevent the spread of °germs. If you have a mask of your own, please wear it to your appointment, if not, we are happy to provide one for you. ° °Thank you for understanding and your cooperation.  ° ° °[facility] Staff ° ° ° °

## 2019-04-05 ENCOUNTER — Other Ambulatory Visit: Payer: Self-pay

## 2019-04-05 ENCOUNTER — Ambulatory Visit (INDEPENDENT_AMBULATORY_CARE_PROVIDER_SITE_OTHER): Payer: Medicaid Other

## 2019-04-05 VITALS — Ht 61.0 in | Wt 160.0 lb

## 2019-04-05 DIAGNOSIS — Z3202 Encounter for pregnancy test, result negative: Secondary | ICD-10-CM | POA: Diagnosis not present

## 2019-04-05 DIAGNOSIS — Z3042 Encounter for surveillance of injectable contraceptive: Secondary | ICD-10-CM

## 2019-04-05 LAB — POCT URINE PREGNANCY: Preg Test, Ur: NEGATIVE

## 2019-04-05 MED ORDER — MEDROXYPROGESTERONE ACETATE 150 MG/ML IM SUSP
150.0000 mg | Freq: Once | INTRAMUSCULAR | Status: AC
  Administered 2019-04-05: 150 mg via INTRAMUSCULAR

## 2019-04-05 NOTE — Progress Notes (Signed)
Pt here for depo injection 150 mg IM given lt deltoid. Tolerated well.Return 12 weeks for next injection. Pad CMA 

## 2019-04-23 NOTE — Telephone Encounter (Signed)
Sending to clinical staff for review: Okay to sign/close encounter or is further follow up needed? ° °

## 2019-05-04 ENCOUNTER — Other Ambulatory Visit: Payer: Self-pay

## 2019-05-04 ENCOUNTER — Ambulatory Visit (INDEPENDENT_AMBULATORY_CARE_PROVIDER_SITE_OTHER): Payer: Medicare Other

## 2019-05-04 DIAGNOSIS — Z23 Encounter for immunization: Secondary | ICD-10-CM | POA: Diagnosis not present

## 2019-05-28 NOTE — Telephone Encounter (Signed)
This patient never returned [name]'s call. Okay to close encounter or is further follow up needed?

## 2019-06-01 ENCOUNTER — Telehealth: Payer: Self-pay | Admitting: Neurology

## 2019-06-01 DIAGNOSIS — G40802 Other epilepsy, not intractable, without status epilepticus: Secondary | ICD-10-CM | POA: Diagnosis not present

## 2019-06-01 DIAGNOSIS — R569 Unspecified convulsions: Secondary | ICD-10-CM | POA: Diagnosis not present

## 2019-06-01 NOTE — Telephone Encounter (Signed)
Nortriptyline can be increased from 10mg  to 25mg  at bedtime. What is she taking to break the migraine?  Is it Migranal?  If that is not effective, we can try Ubrelvy 100mg .  Take 1 tablet, may repeat after 2 hours if needed (maximum 2 tablets in 24 hours).  She may come pick up samples to try first.

## 2019-06-01 NOTE — Telephone Encounter (Signed)
Patient called and said she is having a hard time with bad migraines. She said she had a seizure two weeks ago and hit her head. Then she had another seizure this morning as well. She'd like something stronger for her headaches if possible.  [facility] in [place]

## 2019-06-01 NOTE — Telephone Encounter (Signed)
Pt denies any N&V. Her vision is a little blurry.  Pt continues to take all Seizure meds and HA meds as instructed. States that the HA meds do not help at all. Pt takes Tylenol or aspirin as needed abut once per week and they do not help.

## 2019-06-02 MED ORDER — UBRELVY 100 MG PO TABS: 100.0000 mg | ORAL_TABLET | Freq: Once | ORAL | 0 refills | Status: AC

## 2019-06-02 NOTE — Telephone Encounter (Signed)
OK.  She can come by the office to try Ubrelvy.

## 2019-06-02 NOTE — Telephone Encounter (Signed)
Pt will have a friend come by the office to pick up samples. They will give pts name and DOB. Pt made aware to take one tablet of Ubrelvy at onset and may repeat in 2 hours if needed. Not to exceed more that 2 tablets in 24 hours.

## 2019-06-02 NOTE — Telephone Encounter (Signed)
She is taking just the Nortriptyline for Mirgraines. Not sure about the Migranal.

## 2019-06-10 ENCOUNTER — Other Ambulatory Visit: Payer: Self-pay

## 2019-06-10 DIAGNOSIS — Z20822 Contact with and (suspected) exposure to covid-19: Secondary | ICD-10-CM

## 2019-06-12 LAB — NOVEL CORONAVIRUS, NAA: SARS-CoV-2, NAA: NOT DETECTED

## 2019-06-22 DIAGNOSIS — G40219 Localization-related (focal) (partial) symptomatic epilepsy and epileptic syndromes with complex partial seizures, intractable, without status epilepticus: Secondary | ICD-10-CM | POA: Diagnosis not present

## 2019-06-22 DIAGNOSIS — Z20828 Contact with and (suspected) exposure to other viral communicable diseases: Secondary | ICD-10-CM | POA: Diagnosis present

## 2019-06-22 DIAGNOSIS — R569 Unspecified convulsions: Secondary | ICD-10-CM | POA: Diagnosis not present

## 2019-06-22 DIAGNOSIS — G40209 Localization-related (focal) (partial) symptomatic epilepsy and epileptic syndromes with complex partial seizures, not intractable, without status epilepticus: Secondary | ICD-10-CM | POA: Diagnosis present

## 2019-06-22 DIAGNOSIS — F17200 Nicotine dependence, unspecified, uncomplicated: Secondary | ICD-10-CM | POA: Diagnosis present

## 2019-06-28 ENCOUNTER — Ambulatory Visit: Payer: Medicare Other

## 2019-06-28 MED ORDER — BUPRENORPHINE (OPIOID PARTIAL AGONISTS)
300.00 | Status: DC
Start: 2019-06-28 — End: 2019-06-28

## 2019-06-28 MED ORDER — Medication
Status: DC
Start: ? — End: 2019-06-28

## 2019-06-28 MED ORDER — EQL FLU/SEVERE COLD/CONGESTION 60-4-30-1000 MG PO PACK
180.00 | PACK | ORAL | Status: DC
Start: ? — End: 2019-06-28

## 2019-06-28 MED ORDER — TUSSI PRES-B 2-15-200 MG/5ML PO LIQD
0.50 | ORAL | Status: DC
Start: ? — End: 2019-06-28

## 2019-06-28 MED ORDER — NUTREN 1.0 PO
1.00 | ORAL | Status: DC
Start: ? — End: 2019-06-28

## 2019-06-28 MED ORDER — GNP GLUCOSAME CHONDROITIN DS PO TABS
10.00 | ORAL_TABLET | ORAL | Status: DC
Start: 2019-06-28 — End: 2019-06-28

## 2019-06-28 MED ORDER — CHLORPHEN-PSEUDOEPHED-APAP 30-500&2-30-500 MG PO MISC
250.00 | ORAL | Status: DC
Start: 2019-06-28 — End: 2019-06-28

## 2019-06-28 MED ORDER — DIPHENDRYL 25 MG PO TABS
1.00 | ORAL_TABLET | ORAL | Status: DC
Start: 2019-06-28 — End: 2019-06-28

## 2019-06-28 MED ORDER — Medication
5.00 | Status: DC
Start: 2019-06-27 — End: 2019-06-28

## 2019-06-28 MED ORDER — CVS EAR DROPS OT
40.00 | OTIC | Status: DC
Start: 2019-06-28 — End: 2019-06-28

## 2019-07-07 ENCOUNTER — Other Ambulatory Visit: Payer: Self-pay

## 2019-07-07 ENCOUNTER — Ambulatory Visit: Payer: Medicare Other | Admitting: Family Medicine

## 2019-07-12 ENCOUNTER — Other Ambulatory Visit: Payer: Medicare Other

## 2019-07-12 ENCOUNTER — Ambulatory Visit (INDEPENDENT_AMBULATORY_CARE_PROVIDER_SITE_OTHER): Payer: Medicare Other | Admitting: *Deleted

## 2019-07-12 ENCOUNTER — Other Ambulatory Visit: Payer: Self-pay

## 2019-07-12 DIAGNOSIS — Z3042 Encounter for surveillance of injectable contraceptive: Secondary | ICD-10-CM | POA: Diagnosis not present

## 2019-07-12 LAB — BETA HCG QUANT (REF LAB): hCG Quant: <1 m[IU]/mL

## 2019-07-12 MED ORDER — MEDROXYPROGESTERONE ACETATE 150 MG/ML IM SUSP
150.0000 mg | Freq: Once | INTRAMUSCULAR | Status: AC
  Administered 2019-07-12: 150 mg via INTRAMUSCULAR

## 2019-07-12 NOTE — Progress Notes (Signed)
   NURSE VISIT- INJECTION  SUBJECTIVE:  [name] is a 38 y.o. G3P3 female here for a Depo Provera for contraception/period management. She is a GYN patient.   OBJECTIVE:  There were no vitals taken for this visit.  Appears well, in no apparent distress  Injection administered in: Right deltoid  No orders of the defined types were placed in this encounter.   ASSESSMENT: GYN patient Depo Provera for contraception/period management PLAN: Follow-up: in 11-13 weeks for next Depo   Mykalah Saari, Celene Squibb  07/12/2019 4:04 PM

## 2019-07-13 ENCOUNTER — Encounter: Payer: Self-pay | Admitting: Neurology

## 2019-07-26 ENCOUNTER — Other Ambulatory Visit: Payer: Self-pay | Admitting: Family Medicine

## 2019-07-26 ENCOUNTER — Telehealth: Payer: Self-pay

## 2019-07-26 NOTE — Telephone Encounter (Signed)
Pt called to with no answer to see if she is still taken Naratriptan 1mg . Unable to leave voice mail, will call back later.

## 2019-08-03 ENCOUNTER — Telehealth: Payer: Self-pay | Admitting: Neurology

## 2019-08-03 NOTE — Telephone Encounter (Signed)
Patient called regarding her medication Oxtellar. She said that she received a letter in the mail saying that her Medicaid Insurance was not paying. She said they would giver her a Temporary supply. Please Call. Thank you

## 2019-08-03 NOTE — Telephone Encounter (Signed)
Pt messaged through [employer] asking her to contact her pharmacy or to forward form to our office. This medication is a preferred medication with [place] [facility]. Not sure why they are not covering it. Unless it has something to do with pt having Medicare as well.

## 2019-08-04 ENCOUNTER — Telehealth (INDEPENDENT_AMBULATORY_CARE_PROVIDER_SITE_OTHER): Payer: Medicare Other | Admitting: Neurology

## 2019-08-04 ENCOUNTER — Other Ambulatory Visit: Payer: Self-pay

## 2019-08-04 ENCOUNTER — Encounter: Payer: Self-pay | Admitting: Neurology

## 2019-08-04 VITALS — Ht 61.0 in | Wt 150.0 lb

## 2019-08-04 DIAGNOSIS — G43009 Migraine without aura, not intractable, without status migrainosus: Secondary | ICD-10-CM

## 2019-08-04 DIAGNOSIS — G40019 Localization-related (focal) (partial) idiopathic epilepsy and epileptic syndromes with seizures of localized onset, intractable, without status epilepticus: Secondary | ICD-10-CM

## 2019-08-04 MED ORDER — NURTEC 75 MG PO TBDP: 1.0000 | ORAL_TABLET | ORAL | 4 refills | Status: DC | PRN

## 2019-08-04 MED ORDER — VALPROIC ACID 250 MG/5ML PO SOLN: ORAL | 6 refills | Status: DC

## 2019-08-04 NOTE — Progress Notes (Signed)
Virtual Visit via Video Note The purpose of this virtual visit is to provide medical care while limiting exposure to the novel coronavirus.    Consent was obtained for video visit:  Yes.   Answered questions that patient had about telehealth interaction:  Yes.   I discussed the limitations, risks, security and privacy concerns of performing an evaluation and management service by telemedicine. I also discussed with the patient that there may be a patient responsible charge related to this service. The patient expressed understanding and agreed to proceed.  Pt location: Home Physician Location: office Name of referring provider:  Fayrene Helper, MD I connected with [name] at patients initiation/request on 08/04/2019 at 11:30 AM EST by video enabled telemedicine application and verified that I am speaking with the correct person using two identifiers. Pt MRN:  [id number] Pt DOB:  June 26, 1981 Video Participants:  [name]   History of Present Illness:  The patient was seen as a virtual video visit on 08/04/2019. She has intractable epilepsy and was evaluated at the [facility] where she underwent had an EMU admission from December 1-6, 2020. Records reviewed, final report unavailable for review, baseline EEG showed occasional, intermittent bilateral centro-temporal sharply contoured theta slowing. She reports having one seizure during her stay, however she could not stay longer due to social issues. It was discussed with her prior to hospital discharge that repeat EMU would be warranted to further classify seizures. She was discharged on home medications Oxtellar XR 600mg  1 tab in AM, 2 tabs in PM, Zonisamide 300mg  qhs, and Depakote ER 500mg  in AM, 1000mg  in PM. She states she has not been taking Depakote (even prior to EMU admission) due to difficulty swallowing the big pills. Since her EMU stay, she had one seizure on January 1, no injuries. She had been up until 3AM  and had a little beer the night prior for New Year's eve. She has noticed that seizures tend to occur either at the beginning, middle, or end of the month. She is on Depo-Provera and does not have any menstrual periods. She continues to report bad migraines prior or after her seizures. She is on nortriptyline 10mg  qhs for migraines. Several rescue medications tried have been ineffective, including sumatriptan, rizatriptan, naratriptan, Migranal nasal spray, Ubrelvy, Fioricet, Robaxin, flexeril, ibuprofen, naproxen, diclofenac.    History from Initial Assessment 11/14/2017: This is a 39 year old right-handed woman with a history of migraines, back pain, and seizures, presenting to establish care. Seizures started around 2010 when she started having nocturnal convulsions with tongue bite and urinary incontinence. She would wake up with her children telling her what happened. She would be slightly confused but back to baseline quickly. She was on Keppra and Lamictal with no change in seizure frequency. At that time she was also drinking heavily, but even with decrease in alcohol intake, had not noticed any change in frequency. She was admitted to [facility] for 7 days in October 2012, typical episodes were not captured. EMU report unavailable for review, on discharge summary it was noted there were incidental left-sided sharp waves of which there may or ma not be a correlation to her spells. Keppra stopped and Lamictal dose increased. She was seeing neurologist Dr. Merlene Laughter, records unavailable for review, but it appears Fycompa was added to Lamictal. She reported worsening of seizures, now occurring in wakefulness, and was admitted for another 7-day EMU stay at [facility] in January 2018, again typical events were not  captured. Baseline EEG was abnormal with intermittent left anterior to midtemporal slow waves, sharply contoured bursts of theta slowing over the left temporal region, as well as intermittent right  temporal theta and delta slowing. There were also frequent 3-4 Hz very brief intermediate duration fluctuating runs of rhythmic delta activities (LRDA) maximal at F7. Occasional very brief runs were also seen maximal at F8.There were occasional independent left temporal and right temporal sharp waves. At times left temporal discharges have a wide field. There are ER visits in April, May, October, November 2018 for seizures, medications listed included Vimpat, Fycompa, and Oxtellar. Her last ER visit was on 11/05/17, taking Oxtellar 1200mg  BID without side effects. She reports compliance to medication.  She brings a calendar of her seizures in 2019. She had 2 in January, 2 in February, 1 in March, 3 so far in April. The last seizure occurred on 11/11/17 out of sleep, she woke up with a lip bite. She has had a lot of her seizures occur while playing at the [facility] Family has witnessed the seizures, describing staring/unresponsiveness with lip smacking, then convulsions with head turn to the right. She denies any prior warning symptoms, no post-ictal focal weakness. Her entire body feels sore. No clear seizure triggers, she denies any sleep deprivation or missed medications. She has significantly cut down on alcohol, drinking 1 beer on the weekend.   She denies any olfactory/gustatory hallucinations, deja vu, rising epigastric sensation, focal numbness/tingling/weakness, myoclonic jerks. She has migraines after a seizure and had a prescription for Norco. She also has Zipsor and Robaxin to take after seizures. She denies any dizziness, diplopia, dysarthria/dysphagia, neck/back pain, bowel/bladder dysfunction.   Epilepsy Risk Factors:  Her brother had seizures in childhood. She had a normal birth and early development.  There is no history of febrile convulsions, CNS infections such as meningitis/encephalitis, significant traumatic brain injury, neurosurgical procedures, or family history of seizures. She has  a scar over the left temporal region after hitting her head on a window, she denies any loss of consciousness with this.   Prior AEDs: Lamictal, Keppra, Fycompa, Vimpat Laboratory Data:  EEGs: Her EEG at [facility] had shown bilateral temporal slowing, as well as rhythmic delta at F7 and F8, independent left and right temporal sharp waves. MRI: MRI brain with and without contrast in 10/2008 was unremarkable with scatted white matter changes I personally reviewed MRI brain with and without contrast done 11/17/17 which did not show any acute changes, hippocampi symmetric with no abnormal signal or enhancement seen. There were several white matter changes bilaterally with no abnormal enhancement.     Current Outpatient Medications on File Prior to Visit  Medication Sig Dispense Refill  . famciclovir (FAMVIR) 250 MG tablet TAKE 1 TABLET(250 MG) BY MOUTH TWICE DAILY 60 tablet 0  . medroxyPROGESTERone (DEPO-PROVERA) 150 MG/ML injection INJECT 1 ML IN THE MUSCLE ONCE EVERY 3 MONTHS AS DIRECTED 1 mL 3  . methocarbamol (ROBAXIN) 500 MG tablet Take 1 tablet (500 mg total) by mouth every 6 (six) hours as needed for muscle spasms. 30 tablet 3  . nortriptyline (PAMELOR) 10 MG capsule Take 1 capsule (10 mg total) by mouth at bedtime. 30 capsule 11  . OXTELLAR XR 600 MG TB24 Take 1 tablet in AM, 2 tablets in PM 90 tablet 11  . zonisamide (ZONEGRAN) 100 MG capsule TAKE 3 CAPSULES BY MOUTH EVERY NIGHT 90 capsule 3   No current facility-administered medications on file prior to visit.     Observations/Objective:  Vitals:   08/04/19 1126  Weight: 150 lb (68 kg)  Height: 5\' 1"  (1.549 m)   GEN:  The patient appears stated age and is in NAD.  Neurological examination: Patient is awake, alert, oriented x 3. No aphasia or dysarthria. Intact fluency and comprehension. Remote and recent memory intact. Able to name and repeat. Cranial nerves: Extraocular movements intact with no nystagmus. No facial asymmetry.  Motor: moves all extremities symmetrically, at least anti-gravity x 4.    Assessment and Plan:   This is a 39 yo RH woman with a history of migraines and intractable epilepsy. Semiology suggestive of focal to bilateral tonic-clonic epilepsy arising from the left temporal region (staring/unresponsive with lip smacking followed by convulsion with head version to the right per family). She has had 3 prior 6 to 7-day EMU admissions at [facility] and most recently at [facility], she had one seizure during her admission, we discussed recommendation for repeat EMU admission to further classify seizures. She had needed to leave the hospital due to social issues. She had stopped Depakote due to pill size, we discussed trying liquid formulation, she will restart valproic acid 250mg /5mL: take 55mL BID x 1 week, then increase to 80mL BID. Hopefully this helps with migraines as well. A prescription for Nurtec prn will be sent for insurance approval. She has tried several migraine rescue medications with no relief. Continue Oxtellar XR 600mg  1 tab in AM, 2 tabs in PM and Zonisamide 300mg  qhs. Continue nortriptyline 10mg  qhs for migraine prophylaxis. She does not drive. Follow-up in 4 months, she knows to call for any changes.    Follow Up Instructions:   -I discussed the assessment and treatment plan with the patient. The patient was provided an opportunity to ask questions and all were answered. The patient agreed with the plan and demonstrated an understanding of the instructions.   The patient was advised to call back or seek an in-person evaluation if the symptoms worsen or if the condition fails to improve as anticipated.     4m, MD

## 2019-08-25 ENCOUNTER — Telehealth: Payer: Self-pay | Admitting: Neurology

## 2019-08-25 NOTE — Telephone Encounter (Signed)
Stated she has been taken everything but the liquid Depakote said she took it twice and it is nasty she couldn't take it,

## 2019-08-25 NOTE — Telephone Encounter (Signed)
Pt stated she had a seizure last night now looks like she may have a busted blood vessel in her eye, she stated at she did not hit her eye and that it doesn't hurt, advised she can use over the counter eye drops to help with the redness and that Dr. Karel Jarvis would be informed of her seizure,

## 2019-08-25 NOTE — Telephone Encounter (Addendum)
The following message was left with [employer] on 08/25/19 at 12:19 PM.  Caller states she had a seizure last night and now has a red dot in her eye that looks like a blood vessel burst in the white part of her right eye.  Please give patient a call about this.

## 2019-08-25 NOTE — Telephone Encounter (Signed)
Pls confirm that she has not missed any doses of medications, oxtellar, zonisamide, and liquid Depakote. How is she taking the liquid Depakote? Thanks

## 2019-08-26 ENCOUNTER — Other Ambulatory Visit: Payer: Self-pay | Admitting: Family Medicine

## 2019-08-27 ENCOUNTER — Ambulatory Visit
Admission: EM | Admit: 2019-08-27 | Discharge: 2019-08-27 | Disposition: A | Discharge status: Home / Self Care | Payer: Medicare Other | Attending: Family Medicine | Admitting: Family Medicine

## 2019-08-27 ENCOUNTER — Other Ambulatory Visit: Payer: Self-pay

## 2019-08-27 DIAGNOSIS — H1131 Conjunctival hemorrhage, right eye: Secondary | ICD-10-CM

## 2019-08-27 NOTE — Discharge Instructions (Signed)
You have a burst blood vessel in your eye. This will go away on its own. This can occur again with another seizure or increased strain and stress on your body.  Report to Emergency Department for sudden vision loss, new severe pain, or other concerning symptoms.

## 2019-08-27 NOTE — ED Provider Notes (Signed)
[facility]    CSN: [id number] Arrival date & time: 08/27/19  1604      History   Chief Complaint Chief Complaint  Patient presents with  . blood in left eye    HPI [name] is a 39 y.o. female.   Reports that she had a seizure 3 days ago and has noted that she has a "spot of blood" on the white of her R eye. Reports that she has never had this occur before. Does not recall hitting her eye on anything when she had the seizure. Reports that her daughter witnessed the seizure and did not tell her that she hit her head on anything. Denies headaches, changes in vision, blurred vision, eye pain.   The history is provided by the patient.    Past Medical History:  Diagnosis Date  . Back pain   . Chronic headaches   . Depression   . Genital warts    herpes  . Herpes   . Seizures (HCC) 03/15/2016    Patient Active Problem List   Diagnosis Date Noted  . Encounter for cervical Pap smear with pelvic exam 02/28/2019  . PCR DNA positive for HSV2 07/25/2018  . 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 Seizure (HCC) 11/19/2015  . Amenorrhea 09/28/2011  . Vitamin D deficiency 09/06/2011  . Headache 03/10/2011  . Obesity (BMI 30.0-34.9) 05/28/2009  . Convulsions (HCC) 10/26/2008  . NICOTINE ADDICTION 12/10/2007  . Depression 12/10/2007    Past Surgical History:  Procedure Laterality Date  . CESAREAN SECTION     x 3    OB History    Gravida  3   Para  3   Term      Preterm      AB      Living  3     SAB      TAB      Ectopic      Multiple      Live Births  3            Home Medications    Prior to Admission medications   Medication Sig Start Date End Date Taking? Authorizing Provider  famciclovir (FAMVIR) 250 MG tablet TAKE 1 TABLET(250 MG) BY MOUTH TWICE DAILY 08/26/19   Kerri Perches, MD    medroxyPROGESTERone (DEPO-PROVERA) 150 MG/ML injection INJECT 1 ML IN THE MUSCLE ONCE EVERY 3 MONTHS AS DIRECTED 10/22/18   Adline Potter, NP  methocarbamol (ROBAXIN) 500 MG tablet Take 1 tablet (500 mg total) by mouth every 6 (six) hours as needed for muscle spasms. 02/26/19   Van Clines, MD  nortriptyline (PAMELOR) 10 MG capsule Take 1 capsule (10 mg total) by mouth at bedtime. 03/18/19   Van Clines, MD  OXTELLAR XR 600 MG TB24 Take 1 tablet in AM, 2 tablets in PM 03/18/19   Van Clines, MD  Rimegepant Sulfate (NURTEC) 75 MG TBDP Take 1 tablet by mouth as needed. Take 1 tablet as needed for migraine. Do not take more than 1 dose in 24 hours. 08/04/19   Van Clines, MD  valproic acid (DEPAKENE) 250 MG/5ML solution Take 5 mL twice a day for a week, then increase to 10 mL twice a day 08/04/19   Van Clines, MD  zonisamide (ZONEGRAN) 100 MG capsule TAKE 3 CAPSULES BY MOUTH EVERY NIGHT 03/18/19   Van Clines,  MD    Family History Family History  Problem Relation Age of Onset  . Hypertension Mother   . Healthy Father     Social History Social History   Tobacco Use  . Smoking status: Current Every Day Smoker    Packs/day: 1.00    Years: 10.00    Pack years: 10.00    Types: Cigarettes  . Smokeless tobacco: Never Used  Substance Use Topics  . Alcohol use: Not Currently    Alcohol/week: 2.0 standard drinks    Types: 2 Cans of beer per week    Comment: last drink in December  . Drug use: Yes    Types: Marijuana    Comment: everyday     Allergies   Morphine   Review of Systems Review of Systems  Constitutional: Negative for chills, fatigue and fever.  HENT: Negative for congestion, ear pain and nosebleeds.   Eyes: Positive for redness. Negative for pain and visual disturbance.  Respiratory: Negative for cough and shortness of breath.   Cardiovascular: Negative for chest pain and palpitations.  Gastrointestinal: Negative for abdominal pain and vomiting.   Genitourinary: Negative for dysuria and hematuria.  Musculoskeletal: Negative for arthralgias and back pain.  Skin: Negative for color change and rash.  Neurological: Positive for seizures. Negative for syncope, light-headedness, numbness and headaches.  All other systems reviewed and are negative.    Physical Exam Triage Vital Signs ED Triage Vitals  Enc Vitals Group     BP 08/27/19 1614 134/87     Pulse Rate 08/27/19 1614 85     Resp 08/27/19 1614 16     Temp 08/27/19 1614 98.2 F (36.8 C)     Temp Source 08/27/19 1614 Oral     SpO2 08/27/19 1614 98 %     Weight --      Height --      Head Circumference --      Peak Flow --      Pain Score 08/27/19 1619 0     Pain Loc --      Pain Edu? --      Excl. in GC? --    No data found.  Updated Vital Signs BP 134/87 (BP Location: Right Arm)   Pulse 85   Temp 98.2 F (36.8 C) (Oral)   Resp 16   SpO2 98%   Visual Acuity Right Eye Distance: 20/30 Left Eye Distance: 20/40 Bilateral Distance: 20/40  Right Eye Near:   Left Eye Near:    Bilateral Near:     Physical Exam Vitals and nursing note reviewed.  Constitutional:      General: She is not in acute distress.    Appearance: She is well-developed.  HENT:     Head: Normocephalic and atraumatic.  Eyes:     General: Lids are normal. Lids are everted, no foreign bodies appreciated. Vision grossly intact. Gaze aligned appropriately.     Extraocular Movements: Extraocular movements intact.     Conjunctiva/sclera:     Right eye: Right conjunctiva is injected.   Cardiovascular:     Rate and Rhythm: Normal rate and regular rhythm.     Heart sounds: No murmur.  Pulmonary:     Effort: Pulmonary effort is normal. No respiratory distress.     Breath sounds: Normal breath sounds. No stridor. No wheezing, rhonchi or rales.  Abdominal:     General: Abdomen is flat.     Palpations: Abdomen is soft. There is no mass.  Tenderness: There is no abdominal tenderness.      Hernia: No hernia is present.  Musculoskeletal:        General: Normal range of motion.     Cervical back: Neck supple.  Skin:    General: Skin is warm and dry.  Neurological:     General: No focal deficit present.     Mental Status: She is alert and oriented to person, place, and time.     Cranial Nerves: No cranial nerve deficit.      UC Treatments / Results  Labs (all labs ordered are listed, but only abnormal results are displayed) Labs Reviewed - No data to display  EKG   Radiology No results found.  Procedures Procedures (including critical care time)  Medications Ordered in UC Medications - No data to display  Initial Impression / Assessment and Plan / UC Course  I have reviewed the triage vital signs and the nursing notes.  Pertinent labs & imaging results that were available during my care of the patient were reviewed by me and considered in my medical decision making (see chart for details).     Presents with subconjunctival hemorrhage that appeared after seizure. Pt with hx seizures and taking medications. Followed by neuro. Instructed on when to report to ED. Follow up with neuro and PCP as needed.  Final Clinical Impressions(s) / UC Diagnoses   Final diagnoses:  Subconjunctival hemorrhage, non-traumatic, right     Discharge Instructions     You have a burst blood vessel in your eye. This will go away on its own. This can occur again with another seizure or increased strain and stress on your body.  Report to Emergency Department for sudden vision loss, new severe pain, or other concerning symptoms.     ED Prescriptions    None     PDMP not reviewed this encounter.   Faustino Congress, NP 08/27/19 1701

## 2019-08-27 NOTE — ED Triage Notes (Signed)
Pt presents to UC w/ c/o redness around lateral left iris. Pt states this occurred during a seizure on 08/25/19.  Pt has been using eyedrops w/o relief.  Pt states she has some blurred vision in that left eye.

## 2019-08-30 ENCOUNTER — Other Ambulatory Visit: Payer: Self-pay | Admitting: Neurology

## 2019-08-30 MED ORDER — ZONISAMIDE 100 MG PO CAPS: ORAL_CAPSULE | ORAL | 11 refills | Status: DC

## 2019-08-30 NOTE — Telephone Encounter (Signed)
Since she cannot take the Depakote pills or liquid, would recommend increasing her Zonisamide to 4 caps every night. I have sent in a new Rx, pls confirm with her instructions. Thanks!

## 2019-08-30 NOTE — Telephone Encounter (Signed)
Pt called unable to leave a voice mail  

## 2019-08-31 NOTE — Progress Notes (Signed)
nurtec 75 mg ODT  Approved  Pt ID [id number] Approved until 07/21/20  Reference number [id number]

## 2019-08-31 NOTE — Telephone Encounter (Signed)
Pt called no answer unable to leave voice mail, mail box is full

## 2019-09-01 NOTE — Telephone Encounter (Signed)
Pt called no answer voice mail left for her to call back   

## 2019-09-02 ENCOUNTER — Other Ambulatory Visit: Payer: Self-pay

## 2019-09-02 ENCOUNTER — Telehealth: Payer: Self-pay | Admitting: Neurology

## 2019-09-02 NOTE — Telephone Encounter (Signed)
Pt called informed increasing her Zonisamide to 4 caps every night

## 2019-09-02 NOTE — Telephone Encounter (Signed)
The following message was left with [employer] on 09/02/19 at 12:20 PM.  Caller states her seizure medication at [facility] requires more information from the office.  [facility] in [place]

## 2019-09-02 NOTE — Progress Notes (Signed)
Key: [facility] PA CASE ID [id number]  Waiting for determination on Oxtellar PA

## 2019-09-02 NOTE — Telephone Encounter (Signed)
PA started for pt [name] 600mg   [facility] PA CASE ID [id number]

## 2019-09-02 NOTE — Progress Notes (Signed)
Samples of this drug were given to the patient, quantity  1box 600 mg , Lot Number [id number] exp October 30, 2019 2 box of 300mg , Lot number exp Jul 21, 2020 1 box of 300mg , lot number Jul 23, 2020 exp October 10 2019 1 box of 300mg  lot number [id number] exp May 21 2021

## 2019-09-03 ENCOUNTER — Other Ambulatory Visit: Payer: Self-pay | Admitting: Neurology

## 2019-09-03 ENCOUNTER — Telehealth: Payer: Self-pay

## 2019-09-03 MED ORDER — OXCARBAZEPINE ER 600 MG PO TB24: 600.0000 mg | ORAL_TABLET | Freq: Two times a day (BID) | ORAL | 3 refills | Status: DC

## 2019-09-03 MED ORDER — OXCARBAZEPINE 600 MG PO TABS: ORAL_TABLET | ORAL | 3 refills | Status: DC

## 2019-09-03 NOTE — Telephone Encounter (Signed)
Pt called informed of medication change, from the XR to the ER, medication is still the same dose and and the same directions pt verbalized understanding,

## 2019-09-03 NOTE — Progress Notes (Signed)
Pt called informed that PA for Oxtellar XR was denied by [facility] RX medication was changed to Oxcarbazepine same dose and directions, pt verbalized understanding

## 2019-09-03 NOTE — Addendum Note (Signed)
Addended by: Van Clines on: 09/03/2019 10:03 AM   Modules accepted: Orders

## 2019-09-03 NOTE — Telephone Encounter (Signed)
Patient left msg with after hours about medication and the DR sending to the pharm but they haven't received it. She didn't leave name of med or pharm. Thanks!

## 2019-09-03 NOTE — Telephone Encounter (Signed)
Pt called unable to leave a voice mail but she was Pt was given samples of oxtellar on (09/03/19) until her PA is approved from her insurance and [facility] has her zonisamide script on file in [place]  (pt made aware of script on file yesterday 09/03/19)

## 2019-09-25 ENCOUNTER — Other Ambulatory Visit: Payer: Self-pay | Admitting: Family Medicine

## 2019-10-01 ENCOUNTER — Telehealth: Payer: Self-pay | Admitting: Neurology

## 2019-10-01 NOTE — Telephone Encounter (Signed)
Pt called back. °

## 2019-10-01 NOTE — Telephone Encounter (Signed)
Patient called and requested a call back from [place]. She declined to give anymore details at this time.

## 2019-10-05 ENCOUNTER — Other Ambulatory Visit: Payer: Self-pay | Admitting: Adult Health

## 2019-10-06 ENCOUNTER — Other Ambulatory Visit: Payer: Self-pay

## 2019-10-06 ENCOUNTER — Ambulatory Visit (INDEPENDENT_AMBULATORY_CARE_PROVIDER_SITE_OTHER): Payer: Medicare Other | Admitting: *Deleted

## 2019-10-06 VITALS — BP 132/93 | HR 91 | Ht 61.0 in | Wt 155.8 lb

## 2019-10-06 DIAGNOSIS — Z3042 Encounter for surveillance of injectable contraceptive: Secondary | ICD-10-CM | POA: Diagnosis not present

## 2019-10-06 DIAGNOSIS — Z308 Encounter for other contraceptive management: Secondary | ICD-10-CM

## 2019-10-06 MED ORDER — MEDROXYPROGESTERONE ACETATE 150 MG/ML IM SUSP
150.0000 mg | Freq: Once | INTRAMUSCULAR | Status: AC
  Administered 2019-10-06: 150 mg via INTRAMUSCULAR

## 2019-10-06 NOTE — Progress Notes (Signed)
   NURSE VISIT- INJECTION  SUBJECTIVE:  [name] is a 39 y.o. G3P3 female here for a Depo Provera for contraception/period management. She is a GYN patient.   OBJECTIVE:  There were no vitals taken for this visit.  Appears well, in no apparent distress  Injection administered in: Left deltoid  No orders of the defined types were placed in this encounter.   ASSESSMENT: GYN patient Depo Provera for contraception/period management PLAN: Follow-up: in 11-13 weeks for next Depo   Park Breed  10/06/2019 4:21 PM

## 2019-10-11 ENCOUNTER — Other Ambulatory Visit: Payer: Self-pay | Admitting: Neurology

## 2019-10-11 ENCOUNTER — Other Ambulatory Visit: Payer: Self-pay

## 2019-10-11 MED ORDER — TIZANIDINE HCL 4 MG PO TABS: 4.0000 mg | ORAL_TABLET | Freq: Four times a day (QID) | ORAL | 5 refills | Status: DC | PRN

## 2019-10-13 ENCOUNTER — Encounter: Payer: Self-pay | Admitting: *Deleted

## 2019-10-13 NOTE — Progress Notes (Signed)
Barrett Henle Key: [id number] - PA Case ID: [id number] - Rx #: [id number] Need help? Call us at [phone] Outcome Additional Information Required We are unable to process your request for prior authorization for OXTELLAR XR TAB 600MG  for the above member due to [facility] has a denied request on file for OXTELLAR XR TAB 600MG  for this member. Please refer to the appeals process outlined in the original denial or contact Prior Authorization Department at [phone] for further questions.

## 2019-10-30 ENCOUNTER — Other Ambulatory Visit: Payer: Self-pay | Admitting: Family Medicine

## 2019-11-22 ENCOUNTER — Encounter: Payer: Self-pay | Admitting: Neurology

## 2019-11-24 ENCOUNTER — Emergency Department (HOSPITAL_COMMUNITY)
Admission: EM | Admit: 2019-11-24 | Discharge: 2019-11-24 | Disposition: A | Discharge status: Home / Self Care | Payer: Medicare Other

## 2019-11-24 ENCOUNTER — Telehealth: Payer: Self-pay | Admitting: Neurology

## 2019-11-24 NOTE — Telephone Encounter (Signed)
Pls have her increase the Zonisamide 100mg  caps to take 5 caps qhs (she is supposed to be on 4 caps currently, pls confirm). Pls send in new Rx. Also pls remind her to keep her appointment at [facility] with Dr. [facility] on May 13 to discuss next steps after her hospital stay at [facility]. Thanks

## 2019-11-24 NOTE — Telephone Encounter (Signed)
spoke with pt she had a seizure today went to the ER at [facility], she has not been taken her valproic acid she said its nasty ans she cant take it, but she has been taken her zonegran and oxcarbazepine, no change in sleep no new stress

## 2019-11-24 NOTE — Telephone Encounter (Signed)
Patient called that she was taken to the hospital after having another seizure and was told at ER to let Dr. Karel Jarvis know.

## 2019-11-24 NOTE — Telephone Encounter (Signed)
Pt called no answer, voice mail left for pt to call back

## 2019-11-25 ENCOUNTER — Other Ambulatory Visit: Payer: Self-pay

## 2019-11-25 MED ORDER — ZONISAMIDE 100 MG PO CAPS: ORAL_CAPSULE | ORAL | 11 refills | Status: DC

## 2019-11-25 NOTE — Telephone Encounter (Signed)
Pt stated that she had been taken 3 tablets she will increase to the 5 tablets at night, she said that her son is going to take her to the appointment with Dr. Sherlean Foot,

## 2019-11-26 ENCOUNTER — Telehealth: Payer: Self-pay

## 2019-11-26 NOTE — Telephone Encounter (Signed)
Pt called follow up appointment postponed until pt is seen at [facility] on 5/13 will call pt to schedule an appointment after Dr Karel Jarvis gets notes from Dr Sherlean Foot to reschedule the pt

## 2019-12-02 ENCOUNTER — Ambulatory Visit: Payer: Medicare Other | Admitting: Neurology

## 2019-12-22 ENCOUNTER — Ambulatory Visit (INDEPENDENT_AMBULATORY_CARE_PROVIDER_SITE_OTHER): Payer: Medicare Other | Admitting: *Deleted

## 2019-12-22 DIAGNOSIS — Z3042 Encounter for surveillance of injectable contraceptive: Secondary | ICD-10-CM | POA: Diagnosis not present

## 2019-12-22 MED ORDER — MEDROXYPROGESTERONE ACETATE 150 MG/ML IM SUSP
150.0000 mg | Freq: Once | INTRAMUSCULAR | Status: AC
  Administered 2019-12-22: 150 mg via INTRAMUSCULAR

## 2019-12-22 NOTE — Progress Notes (Signed)
   NURSE VISIT- INJECTION  SUBJECTIVE:  [name] is a 39 y.o. G3P3 female here for a Depo Provera for contraception/period management. She is a GYN patient.   OBJECTIVE:  There were no vitals taken for this visit.  Appears well, in no apparent distress  Injection administered in: Right deltoid  No orders of the defined types were placed in this encounter.   ASSESSMENT: GYN patient Depo Provera for contraception/period management PLAN: Follow-up: in 11-13 weeks for next Depo   Annamarie Dawley  12/22/2019 3:20 PM

## 2019-12-29 ENCOUNTER — Telehealth: Payer: Self-pay | Admitting: Neurology

## 2019-12-29 NOTE — Telephone Encounter (Signed)
Patient called in to let us know she had a seizure this past weekend. She stated her cousin told her about a vitamin called "Ashwaghna" that is supposed to reduce seizures. She wanted to find out if we would recommend her take this.

## 2019-12-29 NOTE — Telephone Encounter (Signed)
It is not something I would recommend.

## 2019-12-29 NOTE — Telephone Encounter (Signed)
Pt called informed that the vitamin Ashwaghna is something she would not recommend pt verbalized understanding

## 2020-01-03 ENCOUNTER — Other Ambulatory Visit: Payer: Self-pay | Admitting: Family Medicine

## 2020-01-05 ENCOUNTER — Other Ambulatory Visit: Payer: Self-pay

## 2020-01-05 ENCOUNTER — Encounter (HOSPITAL_COMMUNITY): Payer: Self-pay | Admitting: Emergency Medicine

## 2020-01-05 ENCOUNTER — Emergency Department (HOSPITAL_COMMUNITY)
Admission: EM | Admit: 2020-01-05 | Discharge: 2020-01-05 | Discharge status: Against Medical Advice | Payer: Medicare Other | Attending: Emergency Medicine | Admitting: Emergency Medicine

## 2020-01-05 DIAGNOSIS — R569 Unspecified convulsions: Secondary | ICD-10-CM | POA: Diagnosis not present

## 2020-01-05 DIAGNOSIS — F1290 Cannabis use, unspecified, uncomplicated: Secondary | ICD-10-CM | POA: Insufficient documentation

## 2020-01-05 DIAGNOSIS — F17210 Nicotine dependence, cigarettes, uncomplicated: Secondary | ICD-10-CM | POA: Insufficient documentation

## 2020-01-05 LAB — CBC WITH DIFFERENTIAL/PLATELET
Abs Immature Granulocytes: 0.03 10*3/uL (ref 0.00–0.07)
Basophils Absolute: 0 10*3/uL (ref 0.0–0.1)
Basophils Relative: 0 %
Eosinophils Absolute: 0 10*3/uL (ref 0.0–0.5)
Eosinophils Relative: 0 %
HCT: 42.3 % (ref 36.0–46.0)
Hemoglobin: 13.6 g/dL (ref 12.0–15.0)
Immature Granulocytes: 0 %
Lymphocytes Relative: 17 %
Lymphs Abs: 1.5 10*3/uL (ref 0.7–4.0)
MCH: 32.3 pg (ref 26.0–34.0)
MCHC: 32.2 g/dL (ref 30.0–36.0)
MCV: 100.5 fL — ABNORMAL HIGH (ref 80.0–100.0)
Monocytes Absolute: 0.5 10*3/uL (ref 0.1–1.0)
Monocytes Relative: 6 %
Neutro Abs: 6.7 10*3/uL (ref 1.7–7.7)
Neutrophils Relative %: 77 %
Platelets: 422 10*3/uL — ABNORMAL HIGH (ref 150–400)
RBC: 4.21 MIL/uL (ref 3.87–5.11)
RDW: 15.2 % (ref 11.5–15.5)
WBC: 8.7 10*3/uL (ref 4.0–10.5)
nRBC: 0 % (ref 0.0–0.2)

## 2020-01-05 LAB — COMPREHENSIVE METABOLIC PANEL
ALT: 17 U/L (ref 0–44)
AST: 18 U/L (ref 15–41)
Albumin: 3.6 g/dL (ref 3.5–5.0)
Alkaline Phosphatase: 78 U/L (ref 38–126)
Anion gap: 11 (ref 5–15)
BUN: 6 mg/dL (ref 6–20)
CO2: 18 mmol/L — ABNORMAL LOW (ref 22–32)
Calcium: 8.7 mg/dL — ABNORMAL LOW (ref 8.9–10.3)
Chloride: 107 mmol/L (ref 98–111)
Creatinine, Ser: 0.98 mg/dL (ref 0.44–1.00)
GFR calc Af Amer: >60 mL/min (ref >60)
GFR calc non Af Amer: >60 mL/min (ref >60)
Glucose, Bld: 103 mg/dL — ABNORMAL HIGH (ref 70–99)
Potassium: 4.1 mmol/L (ref 3.5–5.1)
Sodium: 136 mmol/L (ref 135–145)
Total Bilirubin: 0.2 mg/dL — ABNORMAL LOW (ref 0.3–1.2)
Total Protein: 7.1 g/dL (ref 6.5–8.1)

## 2020-01-05 LAB — VALPROIC ACID LEVEL: Valproic Acid Lvl: <10 ug/mL — ABNORMAL LOW (ref 50.0–100.0)

## 2020-01-05 LAB — CBG MONITORING, ED: Glucose-Capillary: 94 mg/dL (ref 70–99)

## 2020-01-05 MED ORDER — SODIUM CHLORIDE 0.9 % IV SOLN: INTRAVENOUS | Status: DC

## 2020-01-05 MED ORDER — SODIUM CHLORIDE 0.9 % IV BOLUS
1000.0000 mL | Freq: Once | INTRAVENOUS | Status: AC
  Administered 2020-01-05: 1000 mL via INTRAVENOUS
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
## 2020-01-05 NOTE — ED Notes (Signed)
Pt stated to this RN that she needed to go home, EDP made aware, pt signing AMA

## 2020-01-05 NOTE — Discharge Instructions (Addendum)
Take your seizure meds as directed.

## 2020-01-05 NOTE — ED Provider Notes (Signed)
[facility] EMERGENCY DEPARTMENT Provider Note   CSN: [id number] Arrival date & time: 01/05/20  1954     History Chief Complaint  Patient presents with  . Seizures    [name] is a 39 y.o. female.  Pt presents to the ED today with seizures.  The pt has a hx of seizures and has been out of her meds for a few days.  Pt said [facility] would not fill them due to a Medicaid issue.  However, she was able to get them filled today and took the morning dose.  The pt had 2 seizures today.  She bit her tongue, but otherwise has no other injury.  Pt is followed by Dr. Sherlean Foot at [facility] and Dr. Karel Jarvis locally.  She last saw Dr. Sherlean Foot on May 27.  She is currently on Oxcarbazepine 600mg  in AM, 1200mg  in PM and Zonisamide, 500mg  at night.  She was on Depakote also, but said she is not taking that one now.        Past Medical History:  Diagnosis Date  . Back pain   . Chronic headaches   . Depression   . Genital warts    herpes  . Herpes   . Seizures (HCC) 03/15/2016    Patient Active Problem List   Diagnosis Date Noted  . Encounter for cervical Pap smear with pelvic exam 02/28/2019  . PCR DNA positive for HSV2 07/25/2018  . 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 Seizure (HCC) 11/19/2015  . Amenorrhea 09/28/2011  . Vitamin D deficiency 09/06/2011  . Headache 03/10/2011  . Obesity (BMI 30.0-34.9) 05/28/2009  . Convulsions (HCC) 10/26/2008  . NICOTINE ADDICTION 12/10/2007  . Depression 12/10/2007    Past Surgical History:  Procedure Laterality Date  . CESAREAN SECTION     x 3     OB History    Gravida  3   Para  3   Term      Preterm      AB      Living  3     SAB      TAB      Ectopic      Multiple      Live Births  3           Family History  Problem Relation Age of Onset  . Hypertension Mother   . Healthy  Father     Social History   Tobacco Use  . Smoking status: Current Every Day Smoker    Packs/day: 1.00    Years: 10.00    Pack years: 10.00    Types: Cigarettes  . Smokeless tobacco: Never Used  Vaping Use  . Vaping Use: Never used  Substance Use Topics  . Alcohol use: Not Currently    Alcohol/week: 2.0 standard drinks    Types: 2 Cans of beer per week    Comment: last drink in December  . Drug use: Yes    Types: Marijuana    Comment: everyday    Home Medications Prior to Admission medications   Medication Sig Start Date End Date Taking? Authorizing Provider  famciclovir (FAMVIR) 250 MG tablet TAKE 1 TABLET(250 MG) BY MOUTH TWICE DAILY 01/03/20   12/12/2007, MD  medroxyPROGESTERone (DEPO-PROVERA) 150 MG/ML injection ADMINISTER 1 ML IN THE MUSCLE 1 TIME EVERY 3 MONTHS AS DIRECTED 10/06/19   01/05/20, NP  nortriptyline (PAMELOR) 10  MG capsule Take 1 capsule (10 mg total) by mouth at bedtime. 03/18/19   Cameron Sprang, MD  oxcarbazepine (TRILEPTAL) 600 MG tablet Take 1 tab in AM, 2 tabs in PM 09/03/19   Cameron Sprang, MD  Rimegepant Sulfate (NURTEC) 75 MG TBDP Take 1 tablet by mouth as needed. Take 1 tablet as needed for migraine. Do not take more than 1 dose in 24 hours. 08/04/19   Cameron Sprang, MD  tiZANidine (ZANAFLEX) 4 MG tablet Take 1 tablet (4 mg total) by mouth every 6 (six) hours as needed for muscle spasms. 10/11/19   Cameron Sprang, MD  valproic acid (DEPAKENE) 250 MG/5ML solution Take 5 mL twice a day for a week, then increase to 10 mL twice a day 08/04/19   Cameron Sprang, MD  zonisamide (ZONEGRAN) 100 MG capsule TAKE 5 CAPSULES BY MOUTH EVERY NIGHT 11/25/19   Cameron Sprang, MD    Allergies    Morphine  Review of Systems   Review of Systems  Neurological: Positive for seizures.  All other systems reviewed and are negative.   Physical Exam Updated Vital Signs BP 122/77 (BP Location: Right Arm)   Pulse 96   Temp 97.8 F (36.6 C) (Oral)    Resp 15   Ht 5\' 1"  (1.549 m)   Wt 72.6 kg   SpO2 100%   BMI 30.23 kg/m   Physical Exam Vitals and nursing note reviewed.  Constitutional:      Appearance: Normal appearance.  HENT:     Head: Normocephalic and atraumatic.     Comments: Bite to left side of tongue    Right Ear: External ear normal.     Left Ear: External ear normal.     Nose: Nose normal.  Eyes:     Extraocular Movements: Extraocular movements intact.     Conjunctiva/sclera: Conjunctivae normal.     Pupils: Pupils are equal, round, and reactive to light.  Cardiovascular:     Rate and Rhythm: Normal rate and regular rhythm.     Pulses: Normal pulses.     Heart sounds: Normal heart sounds.  Pulmonary:     Effort: Pulmonary effort is normal.     Breath sounds: Normal breath sounds.  Abdominal:     General: Abdomen is flat. Bowel sounds are normal.     Palpations: Abdomen is soft.  Musculoskeletal:        General: Normal range of motion.     Cervical back: Normal range of motion and neck supple.  Skin:    General: Skin is warm.     Capillary Refill: Capillary refill takes less than 2 seconds.  Neurological:     General: No focal deficit present.     Mental Status: She is alert and oriented to person, place, and time.  Psychiatric:        Mood and Affect: Mood normal.        Behavior: Behavior normal.        Thought Content: Thought content normal.        Judgment: Judgment normal.     ED Results / Procedures / Treatments   Labs (all labs ordered are listed, but only abnormal results are displayed) Labs Reviewed  CBC WITH DIFFERENTIAL/PLATELET - Abnormal; Notable for the following components:      Result Value   MCV 100.5 (*)    Platelets 422 (*)    All other components within normal limits  COMPREHENSIVE METABOLIC PANEL - Abnormal;  Notable for the following components:   CO2 18 (*)    Glucose, Bld 103 (*)    Calcium 8.7 (*)    Total Bilirubin 0.2 (*)    All other components within normal  limits  VALPROIC ACID LEVEL  CBG MONITORING, ED  POC URINE PREG, ED    EKG None  Radiology No results found.  Procedures Procedures (including critical care time)  Medications Ordered in ED Medications  sodium chloride 0.9 % bolus 1,000 mL (0 mLs Intravenous Stopped 01/05/20 2127)    And  0.9 %  sodium chloride infusion (has no administration in time range)    ED Course  I have reviewed the triage vital signs and the nursing notes.  Pertinent labs & imaging results that were available during my care of the patient were reviewed by me and considered in my medical decision making (see chart for details).    MDM Rules/Calculators/A&P                         Pt was waiting for labs to come back when she told the nurse she wanted to go.  I talked with her and told her we did not have anything back and we had not given her any seizure meds.  She wanted to go home and take her normal meds.  She is instructed to return if worse.  She is signing AMA.  She is to f/u with her neurologist.     Final Clinical Impression(s) / ED Diagnoses Final diagnoses:  Seizure [facility])    Rx / DC Orders ED Discharge Orders    None       Jacalyn Lefevre, MD 01/05/20 2145

## 2020-02-28 ENCOUNTER — Telehealth: Payer: Self-pay | Admitting: Neurology

## 2020-02-28 ENCOUNTER — Other Ambulatory Visit: Payer: Self-pay | Admitting: Family Medicine

## 2020-02-28 NOTE — Telephone Encounter (Signed)
Agree, this is not neurological, thanks
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
## 2020-02-28 NOTE — Telephone Encounter (Signed)
Spoke with pt and inst that she needs to reach out to her PCP for request of muscle relaxer, she verbalized understanding.

## 2020-03-02 ENCOUNTER — Telehealth: Payer: Self-pay | Admitting: Neurology

## 2020-03-02 NOTE — Telephone Encounter (Signed)
Patient is asking if her paperwork has been filled out and sent to [facility]. Please call

## 2020-03-03 ENCOUNTER — Emergency Department (HOSPITAL_COMMUNITY)
Admission: EM | Admit: 2020-03-03 | Discharge: 2020-03-03 | Disposition: A | Discharge status: Home / Self Care | Payer: Medicare Other | Attending: Emergency Medicine | Admitting: Emergency Medicine

## 2020-03-03 ENCOUNTER — Encounter (HOSPITAL_COMMUNITY): Payer: Self-pay | Admitting: *Deleted

## 2020-03-03 DIAGNOSIS — Z79899 Other long term (current) drug therapy: Secondary | ICD-10-CM | POA: Insufficient documentation

## 2020-03-03 DIAGNOSIS — F1210 Cannabis abuse, uncomplicated: Secondary | ICD-10-CM | POA: Diagnosis not present

## 2020-03-03 DIAGNOSIS — Y998 Other external cause status: Secondary | ICD-10-CM | POA: Insufficient documentation

## 2020-03-03 DIAGNOSIS — S025XXA Fracture of tooth (traumatic), initial encounter for closed fracture: Secondary | ICD-10-CM | POA: Diagnosis not present

## 2020-03-03 DIAGNOSIS — F17210 Nicotine dependence, cigarettes, uncomplicated: Secondary | ICD-10-CM | POA: Diagnosis not present

## 2020-03-03 DIAGNOSIS — Y9389 Activity, other specified: Secondary | ICD-10-CM | POA: Diagnosis not present

## 2020-03-03 DIAGNOSIS — Y9289 Other specified places as the place of occurrence of the external cause: Secondary | ICD-10-CM | POA: Diagnosis not present

## 2020-03-03 DIAGNOSIS — Z0471 Encounter for examination and observation following alleged adult physical abuse: Secondary | ICD-10-CM | POA: Diagnosis not present

## 2020-03-03 DIAGNOSIS — S00502A Unspecified superficial injury of oral cavity, initial encounter: Secondary | ICD-10-CM | POA: Diagnosis present

## 2020-03-03 DIAGNOSIS — S0511XA Contusion of eyeball and orbital tissues, right eye, initial encounter: Secondary | ICD-10-CM | POA: Diagnosis not present

## 2020-03-03 MED ORDER — CHLORHEXIDINE GLUCONATE 0.12 % MT SOLN: 15.0000 mL | Freq: Two times a day (BID) | OROMUCOSAL | 0 refills | Status: AC

## 2020-03-03 MED ORDER — TETRACAINE HCL 0.5 % OP SOLN
2.0000 [drp] | Freq: Once | OPHTHALMIC | Status: AC
  Administered 2020-03-03: 2 [drp] via OPHTHALMIC
  Filled 2020-03-03: qty 4

## 2020-03-03 MED ORDER — FLUORESCEIN SODIUM 1 MG OP STRP
1.0000 | ORAL_STRIP | Freq: Once | OPHTHALMIC | Status: AC
  Administered 2020-03-03: 1 via OPHTHALMIC
  Filled 2020-03-03: qty 1
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
## 2020-03-03 NOTE — ED Notes (Addendum)
Pt noted to be walking out of room with discharge instructions. Per EDP, "I discharged her." pt left prior to ED staff obtaining e-signature, discharge vitals. Pt alert and ambulated out of ED with steady gait. nad noted.
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
## 2020-03-03 NOTE — Discharge Instructions (Addendum)
It is very important you follow-up with your dentist to have this tooth fracture fixed.  I put some cement on the tooth to protect it from infection. If it is not fixed by a dentist, it can lead to a serious infection.  I prescribed you some swish and spit to keep your mouth clean.    Your eye swelling should go down over the next 2 weeks.  I did not see any evidence of serious injury to your eye in the ER today.

## 2020-03-06 NOTE — Telephone Encounter (Signed)
Pt called mail box is full, unable to leave a voice mail.

## 2020-03-06 NOTE — Telephone Encounter (Signed)
Can you pls check with her if she means the Medicaid Community Alternatives Program? If yes, pls ask her why she is applying for this? The letter says that she should have a chronic and severe medical condition meeting institutional or nursing home level of care. Seizures usually do not meet this level unless she needs someone to give her the medications and needs help with dressing/bathing/meals.

## 2020-03-07 NOTE — Telephone Encounter (Signed)
Pt called and left a voice mail on nurses phone I called her back no answer unable to leave voice mail,

## 2020-03-07 NOTE — Telephone Encounter (Signed)
Pt called no answer voice mail left for pt to call back 

## 2020-03-15 ENCOUNTER — Telehealth: Payer: Self-pay | Admitting: Neurology

## 2020-03-15 ENCOUNTER — Ambulatory Visit (INDEPENDENT_AMBULATORY_CARE_PROVIDER_SITE_OTHER): Payer: Medicare Other | Admitting: *Deleted

## 2020-03-15 DIAGNOSIS — Z3042 Encounter for surveillance of injectable contraceptive: Secondary | ICD-10-CM | POA: Diagnosis not present

## 2020-03-15 MED ORDER — MEDROXYPROGESTERONE ACETATE 150 MG/ML IM SUSP
150.0000 mg | Freq: Once | INTRAMUSCULAR | Status: AC
  Administered 2020-03-15: 150 mg via INTRAMUSCULAR
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
## 2020-03-15 NOTE — Telephone Encounter (Signed)
Patient notified and voiced understanding.
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
## 2020-03-15 NOTE — Progress Notes (Signed)
   NURSE VISIT- INJECTION  SUBJECTIVE:  [name] is a 39 y.o. G3P3 female here for a Depo Provera for contraception/period management. She is a GYN patient.   OBJECTIVE:  There were no vitals taken for this visit.  Appears well, in no apparent distress  Injection administered in: Left deltoid  Meds ordered this encounter  Medications  . medroxyPROGESTERone (DEPO-PROVERA) injection 150 mg    ASSESSMENT: GYN patient Depo Provera for contraception/period management PLAN: Follow-up: in 11-13 weeks for next Depo   Nance Pear  03/15/2020 4:18 PM

## 2020-03-20 ENCOUNTER — Other Ambulatory Visit: Payer: Self-pay | Admitting: Family Medicine

## 2020-03-21 ENCOUNTER — Other Ambulatory Visit: Payer: Self-pay | Admitting: Neurology

## 2020-03-30 ENCOUNTER — Other Ambulatory Visit: Payer: Self-pay

## 2020-03-30 MED ORDER — TIZANIDINE HCL 4 MG PO TABS: 4.0000 mg | ORAL_TABLET | Freq: Four times a day (QID) | ORAL | 0 refills | Status: DC | PRN

## 2020-04-12 ENCOUNTER — Other Ambulatory Visit: Payer: Self-pay | Admitting: Neurology

## 2020-04-14 ENCOUNTER — Other Ambulatory Visit: Payer: Self-pay | Admitting: Adult Health

## 2020-04-27 ENCOUNTER — Other Ambulatory Visit: Payer: Self-pay

## 2020-04-27 ENCOUNTER — Ambulatory Visit (INDEPENDENT_AMBULATORY_CARE_PROVIDER_SITE_OTHER): Payer: Medicare Other | Admitting: Family Medicine

## 2020-04-27 ENCOUNTER — Encounter: Payer: Self-pay | Admitting: Family Medicine

## 2020-04-27 VITALS — BP 120/80 | HR 80 | Ht 62.0 in | Wt 149.1 lb

## 2020-04-27 DIAGNOSIS — Z0000 Encounter for general adult medical examination without abnormal findings: Secondary | ICD-10-CM

## 2020-04-27 DIAGNOSIS — F17200 Nicotine dependence, unspecified, uncomplicated: Secondary | ICD-10-CM | POA: Diagnosis not present

## 2020-04-27 DIAGNOSIS — F322 Major depressive disorder, single episode, severe without psychotic features: Secondary | ICD-10-CM | POA: Diagnosis not present

## 2020-04-27 DIAGNOSIS — F419 Anxiety disorder, unspecified: Secondary | ICD-10-CM

## 2020-04-27 DIAGNOSIS — F4310 Post-traumatic stress disorder, unspecified: Secondary | ICD-10-CM | POA: Diagnosis not present

## 2020-04-27 DIAGNOSIS — Z23 Encounter for immunization: Secondary | ICD-10-CM | POA: Diagnosis not present

## 2020-04-27 NOTE — Patient Instructions (Addendum)
  F/u in office with MD in mid [place], call if you need me sooner  Flu vaccine today  Please DO GET the covid vaccines, you NEED them  You are referred to Therapist and to Psychiiatry, please do follow through
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
## 2020-04-27 NOTE — Assessment & Plan Note (Addendum)
Refer therapy and psychiatry

## 2020-04-27 NOTE — Assessment & Plan Note (Addendum)
Score of 18 in 04/2020,  refer to therapy and psychiatry

## 2020-04-27 NOTE — Assessment & Plan Note (Signed)
Annual exam as documented. Counseling done  re healthy lifestyle involving commitment to 150 minutes exercise per week, heart healthy diet, and attaining healthy weight.The importance of adequate sleep also discussed. Changes in health habits are decided on by the patient with goals and time frames  set for achieving them. Immunization and cancer screening needs are specifically addressed at this visit. 

## 2020-04-28 ENCOUNTER — Encounter: Payer: Self-pay | Admitting: Family Medicine

## 2020-04-28 DIAGNOSIS — F4310 Post-traumatic stress disorder, unspecified: Secondary | ICD-10-CM | POA: Insufficient documentation

## 2020-04-28 NOTE — Assessment & Plan Note (Addendum)
refer to psychology,and psychiatryt,  re eval in  10 weeks

## 2020-04-28 NOTE — Assessment & Plan Note (Signed)
Asked:confirms currently smokes cigarettes °Assess: Unwilling to set a quit date, but is cutting back °Advise: needs to QUIT to reduce risk of cancer, cardio and cerebrovascular disease °Assist: counseled for 5 minutes and literature provided °Arrange: follow up in 2 to 4 months ° °

## 2020-05-01 ENCOUNTER — Other Ambulatory Visit: Payer: Self-pay | Admitting: Neurology

## 2020-05-01 NOTE — Telephone Encounter (Signed)
Pt needs an appointment to get more refills , message sent to get her scheduled,

## 2020-05-14 ENCOUNTER — Other Ambulatory Visit: Payer: Self-pay | Admitting: Neurology

## 2020-05-23 ENCOUNTER — Other Ambulatory Visit: Payer: Self-pay | Admitting: *Deleted

## 2020-05-25 ENCOUNTER — Other Ambulatory Visit: Payer: Self-pay | Admitting: Family Medicine

## 2020-05-26 ENCOUNTER — Telehealth: Payer: Self-pay | Admitting: Neurology

## 2020-05-26 ENCOUNTER — Other Ambulatory Visit: Payer: Self-pay

## 2020-05-26 ENCOUNTER — Emergency Department (HOSPITAL_COMMUNITY)
Admission: EM | Admit: 2020-05-26 | Discharge: 2020-05-26 | Disposition: A | Discharge status: Home / Self Care | Payer: Medicare Other | Attending: Emergency Medicine | Admitting: Emergency Medicine

## 2020-05-26 DIAGNOSIS — R4182 Altered mental status, unspecified: Secondary | ICD-10-CM | POA: Insufficient documentation

## 2020-05-26 DIAGNOSIS — F17210 Nicotine dependence, cigarettes, uncomplicated: Secondary | ICD-10-CM | POA: Diagnosis not present

## 2020-05-26 DIAGNOSIS — R569 Unspecified convulsions: Secondary | ICD-10-CM | POA: Diagnosis present

## 2020-05-26 DIAGNOSIS — Z79899 Other long term (current) drug therapy: Secondary | ICD-10-CM | POA: Diagnosis not present

## 2020-05-26 LAB — BASIC METABOLIC PANEL
Anion gap: 8 (ref 5–15)
BUN: 5 mg/dL — ABNORMAL LOW (ref 6–20)
CO2: 20 mmol/L — ABNORMAL LOW (ref 22–32)
Calcium: 9 mg/dL (ref 8.9–10.3)
Chloride: 105 mmol/L (ref 98–111)
Creatinine, Ser: 0.88 mg/dL (ref 0.44–1.00)
GFR, Estimated: >60 mL/min (ref >60)
Glucose, Bld: 93 mg/dL (ref 70–99)
Potassium: 4 mmol/L (ref 3.5–5.1)
Sodium: 133 mmol/L — ABNORMAL LOW (ref 135–145)

## 2020-05-26 LAB — CBC
HCT: 39.2 % (ref 36.0–46.0)
Hemoglobin: 13.1 g/dL (ref 12.0–15.0)
MCH: 33.4 pg (ref 26.0–34.0)
MCHC: 33.4 g/dL (ref 30.0–36.0)
MCV: 100 fL (ref 80.0–100.0)
Platelets: 388 10*3/uL (ref 150–400)
RBC: 3.92 MIL/uL (ref 3.87–5.11)
RDW: 15.1 % (ref 11.5–15.5)
WBC: 4.6 10*3/uL (ref 4.0–10.5)
nRBC: 0 % (ref 0.0–0.2)

## 2020-05-26 LAB — MAGNESIUM: Magnesium: 2 mg/dL (ref 1.7–2.4)

## 2020-05-26 LAB — CBG MONITORING, ED: Glucose-Capillary: 95 mg/dL (ref 70–99)

## 2020-05-26 NOTE — ED Notes (Signed)
Patient states she could not remember how to sign her name at the finance office

## 2020-05-26 NOTE — ED Triage Notes (Signed)
Advised by PCP to come in for evaluation of possible stroke. Patient's mother states she normally does not repeat herself and was fine yesterday.

## 2020-05-26 NOTE — ED Provider Notes (Signed)
[facility] EMERGENCY DEPARTMENT Provider Note   CSN: [id number] Arrival date & time: 05/26/20  1352     History Chief Complaint  Patient presents with  . Altered Mental Status    [name] is a 39 y.o. female.  Patient with a history of focal neuro seizures.  Usually occur about 4 times a month according to [facility]sciences where she is followed.  Also followed by Dr. Lodema Hong.  Patient's been referred to [facility]iences for stereo ablation procedure for the seizures.  Patient's last seen by them in September.  Patient is on several medications.  Based on the ones listed no levels to check here.  Patient was at the bank started to get an shaking of her left arm.  And then could not remember how to write her name.  The description sounds to be what [facility] neuro[facility] this is describing.  Patient now feels fine.  She felt fine earlier in the day.  No headache.  No seizure activity now.  Patient now verbalizing well.  Able to describe what she was doing today and how she felt.  Also able to remember that she could not remember how to write her name.        Past Medical History:  Diagnosis Date  . Back pain   . Chronic headaches   . Depression   . Genital warts    herpes  . Herpes   . Seizures (HCC) 03/15/2016    Patient Active Problem List   Diagnosis Date Noted  . Post traumatic stress disorder (PTSD) 04/28/2020  . Post-traumatic stress 04/27/2020  . Depression, major, single episode, severe (HCC) 04/27/2020  . Encounter for cervical Pap smear with pelvic exam 02/28/2019  . PCR DNA positive for HSV2 07/25/2018  . 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 Seizure (HCC) 11/19/2015  . Annual physical exam 08/15/2014  . Amenorrhea 09/28/2011  . Vitamin D deficiency 09/06/2011  . Headache 03/10/2011  . Obesity (BMI 30.0-34.9)  05/28/2009  . Convulsions (HCC) 10/26/2008  . NICOTINE ADDICTION 12/10/2007  . Depression 12/10/2007    Past Surgical History:  Procedure Laterality Date  . CESAREAN SECTION     x 3     OB History    Gravida  3   Para  3   Term      Preterm      AB      Living  3     SAB      TAB      Ectopic      Multiple      Live Births  3           Family History  Problem Relation Age of Onset  . Hypertension Mother   . Healthy Father     Social History   Tobacco Use  . Smoking status: Current Every Day Smoker    Packs/day: 1.00    Years: 10.00    Pack years: 10.00    Types: Cigarettes  . Smokeless tobacco: Never Used  Vaping Use  . Vaping Use: Never used  Substance Use Topics  . Alcohol use: Not Currently    Alcohol/week: 2.0 standard drinks    Types: 2 Cans of beer per week    Comment: last drink in December  . Drug use: Yes    Types: Marijuana    Comment: everyday    Home Medications Prior to  Admission medications   Medication Sig Start Date End Date Taking? Authorizing Provider  famciclovir (FAMVIR) 250 MG tablet TAKE 1 TABLET(250 MG) BY MOUTH TWICE DAILY 05/25/20   Kerri Perches, MD  medroxyPROGESTERone (DEPO-PROVERA) 150 MG/ML injection Inject 79mL intramuscularly every 3 months 04/17/20   Cheral Marker, CNM  nortriptyline (PAMELOR) 10 MG capsule Take 1 tablet by mouth every evening 03/21/20   Van Clines, MD  oxcarbazepine (TRILEPTAL) 600 MG tablet Take 1 tab in AM, 2 tabs in PM 09/03/19   Van Clines, MD  Rimegepant Sulfate (NURTEC) 75 MG TBDP Take 1 tablet by mouth as needed. Take 1 tablet as needed for migraine. Do not take more than 1 dose in 24 hours. 08/04/19   Van Clines, MD  tiZANidine (ZANAFLEX) 4 MG tablet Take 1 tablet by mouth every 6 hours as needed for muscle spasms. Need appt 05/15/20   Van Clines, MD  valproic acid (DEPAKENE) 250 MG/5ML solution Take 5 mL twice a day for a week, then increase to 10 mL twice  a day 08/04/19   Van Clines, MD  zonisamide (ZONEGRAN) 100 MG capsule TAKE 5 CAPSULES BY MOUTH EVERY NIGHT 11/25/19   Van Clines, MD    Allergies    Morphine  Review of Systems   Review of Systems  Constitutional: Negative for chills and fever.  HENT: Negative for rhinorrhea and sore throat.   Eyes: Negative for visual disturbance.  Respiratory: Negative for cough and shortness of breath.   Cardiovascular: Negative for chest pain and leg swelling.  Gastrointestinal: Negative for abdominal pain, diarrhea, nausea and vomiting.  Genitourinary: Negative for dysuria.  Musculoskeletal: Negative for back pain and neck pain.  Skin: Negative for rash.  Neurological: Positive for seizures. Negative for dizziness, light-headedness and headaches.  Hematological: Does not bruise/bleed easily.  Psychiatric/Behavioral: Negative for confusion.    Physical Exam Updated Vital Signs BP 132/88 (BP Location: Left Arm)   Pulse 83   Temp 98.6 F (37 C) (Oral)   Resp 18   SpO2 100%   Physical Exam Vitals and nursing note reviewed.  Constitutional:      General: She is not in acute distress.    Appearance: She is well-developed.  HENT:     Head: Normocephalic and atraumatic.  Eyes:     Conjunctiva/sclera: Conjunctivae normal.  Cardiovascular:     Rate and Rhythm: Normal rate and regular rhythm.     Heart sounds: No murmur heard.   Pulmonary:     Effort: Pulmonary effort is normal. No respiratory distress.     Breath sounds: Normal breath sounds.  Abdominal:     Palpations: Abdomen is soft.     Tenderness: There is no abdominal tenderness.  Musculoskeletal:     Cervical back: Neck supple.  Skin:    General: Skin is warm and dry.  Neurological:     General: No focal deficit present.     Mental Status: She is alert and oriented to person, place, and time.     Cranial Nerves: No cranial nerve deficit.     Sensory: No sensory deficit.     Motor: No weakness.     Comments:    Neuro exam without any focal deficit.  Motor strength is good.  No numbness.  Coordination intact.  Cranial nerves intact.     ED Results / Procedures / Treatments   Labs (all labs ordered are listed, but only abnormal results are displayed) Labs Reviewed  BASIC METABOLIC PANEL  CBC  MAGNESIUM  CBG MONITORING, ED    EKG EKG Interpretation  Date/Time:  Friday May 26 2020 14:17:20 EDT Ventricular Rate:  80 PR Interval:    QRS Duration: 97 QT Interval:  335 QTC Calculation: 387 R Axis:   71 Text Interpretation: Sinus rhythm Ventricular premature complex Nonspecific T abnormalities, anterior leads Baseline wander in lead(s) V6 No significant change since last tracing Confirmed by Vanetta Mulders [id number]) on 05/26/2020 2:26:48 PM   Radiology No results found.  Procedures Procedures (including critical care time)  Medications Ordered in ED Medications - No data to display  ED Course  I have reviewed the triage vital signs and the nursing notes.  Pertinent labs & imaging results that were available during my care of the patient were reviewed by me and considered in my medical decision making (see chart for details).    MDM Rules/Calculators/A&P                          Based on chart review patient has a history of focal seizures.  Appears that that is what occurred today.  According to [facility] neuro[facility] notes these occur 4 times a month.  Patient is on several antiseizure medicines.  They are evaluating her for a stereo procedure to help ablate the cause of the seizures or to narrow down where the seizures are coming from so that they can eradicate them altogether.  Since medications not helping her very well.  Patient felt fine earlier today.  Patient seems to be back to normal now.  No activity witnessed by [place].  Will check basic labs.   Final Clinical Impression(s) / ED Diagnoses Final diagnoses:  Focal seizure [facility])    Rx / DC Orders ED Discharge Orders     None       Vanetta Mulders, MD 05/26/20 1431

## 2020-05-26 NOTE — Telephone Encounter (Signed)
For your review only

## 2020-05-26 NOTE — Discharge Instructions (Addendum)
Follow up with your md if needed °

## 2020-06-07 ENCOUNTER — Other Ambulatory Visit: Payer: Self-pay

## 2020-06-07 ENCOUNTER — Ambulatory Visit (INDEPENDENT_AMBULATORY_CARE_PROVIDER_SITE_OTHER): Payer: Medicare Other | Admitting: *Deleted

## 2020-06-07 DIAGNOSIS — Z3042 Encounter for surveillance of injectable contraceptive: Secondary | ICD-10-CM | POA: Diagnosis not present

## 2020-06-07 MED ORDER — MEDROXYPROGESTERONE ACETATE 150 MG/ML IM SUSP
150.0000 mg | Freq: Once | INTRAMUSCULAR | Status: AC
  Administered 2020-06-07: 150 mg via INTRAMUSCULAR

## 2020-06-07 NOTE — Progress Notes (Signed)
   NURSE VISIT- INJECTION  SUBJECTIVE:  [name] is a 39 y.o. G3P3 female here for a Depo Provera for contraception/period management. She is a GYN patient.   OBJECTIVE:  There were no vitals taken for this visit.  Appears well, in no apparent distress  Injection administered in: Right deltoid  Meds ordered this encounter  Medications  . medroxyPROGESTERone (DEPO-PROVERA) injection 150 mg    ASSESSMENT: GYN patient Depo Provera for contraception/period management PLAN: Follow-up: in 11-13 weeks for next Depo   Kalie Cabral A Sieara Bremer  06/07/2020 2:28 PM

## 2020-07-04 ENCOUNTER — Other Ambulatory Visit: Payer: Self-pay | Admitting: Neurology

## 2020-07-27 ENCOUNTER — Other Ambulatory Visit: Payer: 59

## 2020-07-27 DIAGNOSIS — Z20822 Contact with and (suspected) exposure to covid-19: Secondary | ICD-10-CM

## 2020-07-28 ENCOUNTER — Other Ambulatory Visit: Payer: Self-pay | Admitting: Women's Health

## 2020-07-28 LAB — NOVEL CORONAVIRUS, NAA: SARS-CoV-2, NAA: NOT DETECTED

## 2020-07-28 LAB — SARS-COV-2, NAA 2 DAY TAT

## 2020-08-01 ENCOUNTER — Other Ambulatory Visit: Payer: Self-pay | Admitting: Family Medicine

## 2020-08-01 ENCOUNTER — Telehealth: Payer: Self-pay | Admitting: Neurology

## 2020-08-01 NOTE — Telephone Encounter (Signed)
Patient called in stating she is still having migraines and would like to see if there is something she could take for it? She also stated her COVID test came back negative and would like to know if it would be safe for her to take the COVID vaccine with her having seizures?

## 2020-08-01 NOTE — Telephone Encounter (Signed)
I have not seen her in a year, please let her know she needs to have a visit so we can adjust medications. I have an opening on Friday at 8:30am, pls let her know otherwise I am booked out so far. Thanks

## 2020-08-01 NOTE — Telephone Encounter (Signed)
Spoke to pt, Pt state the Nortriptyline 10 and the nurtec not helping with her headaches at all.  Please advise.   Pt also wants to know if it will be safe for her to have the Vaccines?

## 2020-08-02 ENCOUNTER — Telehealth (INDEPENDENT_AMBULATORY_CARE_PROVIDER_SITE_OTHER): Payer: 59 | Admitting: Family Medicine

## 2020-08-02 ENCOUNTER — Encounter: Payer: Self-pay | Admitting: Family Medicine

## 2020-08-02 ENCOUNTER — Other Ambulatory Visit: Payer: Self-pay

## 2020-08-02 DIAGNOSIS — G40019 Localization-related (focal) (partial) idiopathic epilepsy and epileptic syndromes with seizures of localized onset, intractable, without status epilepticus: Secondary | ICD-10-CM

## 2020-08-02 DIAGNOSIS — F17200 Nicotine dependence, unspecified, uncomplicated: Secondary | ICD-10-CM

## 2020-08-02 DIAGNOSIS — F17210 Nicotine dependence, cigarettes, uncomplicated: Secondary | ICD-10-CM | POA: Diagnosis not present

## 2020-08-02 DIAGNOSIS — Z79899 Other long term (current) drug therapy: Secondary | ICD-10-CM

## 2020-08-02 DIAGNOSIS — E559 Vitamin D deficiency, unspecified: Secondary | ICD-10-CM

## 2020-08-02 DIAGNOSIS — Z1159 Encounter for screening for other viral diseases: Secondary | ICD-10-CM | POA: Diagnosis not present

## 2020-08-02 DIAGNOSIS — E669 Obesity, unspecified: Secondary | ICD-10-CM

## 2020-08-02 DIAGNOSIS — E66811 Obesity, class 1: Secondary | ICD-10-CM

## 2020-08-02 DIAGNOSIS — R569 Unspecified convulsions: Secondary | ICD-10-CM | POA: Diagnosis not present

## 2020-08-02 NOTE — Assessment & Plan Note (Signed)
Asked:confirms currently smokes cigarettes, 1 PPD Assess: Unwilling to set a quit date, but is cutting back Advise: needs to QUIT to reduce risk of cancer, cardio and cerebrovascular disease Assist: counseled for 5 minutes and literature provided Arrange: follow up in 2 to 4 months  

## 2020-08-02 NOTE — Assessment & Plan Note (Signed)
Reports daily headaches following head trauma, managed by Neurology

## 2020-08-02 NOTE — Assessment & Plan Note (Signed)
Not suicidal or homicidal, will address at next visit
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
## 2020-08-02 NOTE — Patient Instructions (Addendum)
F/U in  3 months, re evaluate smoking , goal of 10/day, and evaluate depression. Call if you need me sooner  Great that you are getting the covid vaccine tomorrow  Aim for maximum of 10 cigarettes/ day in next 3 months as we discussed  Please ger fasting lipid, cmp and EGFR, TDH , vit D and hepatitis C screen this week  It is important that you exercise regularly at least 30 minutes 5 times a week. If you develop chest pain, have severe difficulty breathing, or feel very tired, stop exercising immediately and seek medical attention  Think about what you will eat, plan ahead. Choose " clean, green, fresh or frozen" over canned, processed or packaged foods which are more sugary, salty and fatty. 70 to 75% of food eaten should be vegetables and fruit. Three meals at set times with snacks allowed between meals, but they must be fruit or vegetables. Aim to eat over a 12 hour period , example 7 am to 7 pm, and STOP after  your last meal of the day. Drink water,generally about 64 ounces per day, no other drink is as healthy. Fruit juice is best enjoyed in a healthy way, by EATING the fruit. Thanks for choosing [facility], we consider it a privelige to serve you.

## 2020-08-02 NOTE — Assessment & Plan Note (Signed)
Reports improvement, seizure fee x 2.5 months, managed by Neurology , reports improved pill compliance as medication is pre packed

## 2020-08-03 ENCOUNTER — Ambulatory Visit: Payer: 59 | Attending: Internal Medicine

## 2020-08-03 DIAGNOSIS — Z23 Encounter for immunization: Secondary | ICD-10-CM

## 2020-08-03 NOTE — Progress Notes (Signed)
   Covid-19 Vaccination Clinic  Name:  [name]    MRN: [id number] DOB: 1980/10/04  08/03/2020  [name] was observed post Covid-19 immunization for 15 minutes without incident. She was provided with Vaccine Information Sheet and instruction to access the V-Safe system.   [name] was instructed to call 911 with any severe reactions post vaccine: [name] Difficulty breathing  . Swelling of face and throat  . A fast heartbeat  . A bad rash all over body  . Dizziness and weakness   Immunizations Administered    Name Date Dose VIS Date Route   Moderna COVID-19 Vaccine 08/03/2020  1:15 PM 0.5 mL 05/10/2020 Intramuscular   Manufacturer: Moderna   Lot: [id number]   NDC: 48016-553-74

## 2020-08-04 ENCOUNTER — Other Ambulatory Visit: Payer: Self-pay

## 2020-08-04 ENCOUNTER — Telehealth: Payer: 59 | Admitting: Neurology

## 2020-08-07 ENCOUNTER — Ambulatory Visit: Payer: Medicare Other | Admitting: Family Medicine

## 2020-08-22 ENCOUNTER — Other Ambulatory Visit: Payer: Self-pay | Admitting: Adult Health

## 2020-08-23 ENCOUNTER — Ambulatory Visit: Payer: Medicare Other

## 2020-08-28 ENCOUNTER — Ambulatory Visit: Payer: 59

## 2020-08-31 ENCOUNTER — Ambulatory Visit: Payer: 59 | Attending: Internal Medicine

## 2020-08-31 DIAGNOSIS — Z23 Encounter for immunization: Secondary | ICD-10-CM

## 2020-08-31 NOTE — Progress Notes (Signed)
   Covid-19 Vaccination Clinic  Name:  DEVENEY BAYON    MRN: [id number] DOB: 11-03-1980  08/31/2020  Ms. Moyd was observed post Covid-19 immunization for 15 minutes without incident. She was provided with Vaccine Information Sheet and instruction to access the V-Safe system.   Ms. Cerino was instructed to call 911 with any severe reactions post vaccine: [name] Difficulty breathing  . Swelling of face and throat  . A fast heartbeat  . A bad rash all over body  . Dizziness and weakness   Immunizations Administered    Name Date Dose VIS Date Route   Moderna COVID-19 Vaccine 08/31/2020  1:07 PM 0.5 mL 05/10/2020 Intramuscular   Manufacturer: Moderna   Lot: [id number]   NDC: 02637-858-85

## 2020-09-04 ENCOUNTER — Ambulatory Visit (INDEPENDENT_AMBULATORY_CARE_PROVIDER_SITE_OTHER): Payer: 59

## 2020-09-04 ENCOUNTER — Other Ambulatory Visit: Payer: Self-pay

## 2020-09-04 DIAGNOSIS — Z3042 Encounter for surveillance of injectable contraceptive: Secondary | ICD-10-CM | POA: Diagnosis not present

## 2020-09-04 MED ORDER — MEDROXYPROGESTERONE ACETATE 150 MG/ML IM SUSP
150.0000 mg | Freq: Once | INTRAMUSCULAR | Status: AC
  Administered 2020-09-04: 150 mg via INTRAMUSCULAR

## 2020-09-04 NOTE — Progress Notes (Signed)
   NURSE VISIT- INJECTION  SUBJECTIVE:  [name] is a 40 y.o. G3P3 female here for a Depo Provera for contraception/period management. She is a GYN patient.   OBJECTIVE:  There were no vitals taken for this visit.  Appears well, in no apparent distress  Injection administered in: Right deltoid  Meds ordered this encounter  Medications  . medroxyPROGESTERone (DEPO-PROVERA) injection 150 mg    ASSESSMENT: GYN patient Depo Provera for contraception/period management PLAN: Follow-up: in 11-13 weeks for next Depo   Demontrez Rindfleisch A Nazaiah Navarrete  09/04/2020 3:54 PM

## 2020-09-11 ENCOUNTER — Other Ambulatory Visit: Payer: Self-pay | Admitting: Family Medicine

## 2020-09-11 ENCOUNTER — Other Ambulatory Visit: Payer: Self-pay | Admitting: Neurology

## 2020-09-11 ENCOUNTER — Other Ambulatory Visit: Payer: Self-pay | Admitting: Adult Health

## 2020-09-25 ENCOUNTER — Telehealth: Payer: Self-pay | Admitting: Neurology

## 2020-09-25 ENCOUNTER — Other Ambulatory Visit: Payer: Self-pay

## 2020-09-25 ENCOUNTER — Other Ambulatory Visit: Payer: Self-pay | Admitting: Neurology

## 2020-09-25 MED ORDER — TIZANIDINE HCL 4 MG PO TABS: ORAL_TABLET | ORAL | 5 refills | Status: DC

## 2020-09-25 NOTE — Telephone Encounter (Signed)
Pharmacy changed new script sent

## 2020-09-25 NOTE — Telephone Encounter (Signed)
Patient called and states that the medication was sent to the wrong pharmacy . She could not tell me the name of the medication   She needs it sent to the [facility]

## 2020-10-02 ENCOUNTER — Telehealth: Payer: Self-pay | Admitting: Neurology

## 2020-10-02 NOTE — Telephone Encounter (Signed)
She has not been seen in over a year, she was scheduled for an video visit in January but did not pick up. She needs an appointment for any new medication. We'll put her on waitlist, but she has to show up/answer phone. Also, [facility] has been trying to contact her, pls have her call [phone] Marylu Lund) to schedule appointment with them as well to move forward with further treatment of seizures.

## 2020-10-02 NOTE — Telephone Encounter (Signed)
Pt is sch for 12-22-20 and is on the wait list for Dr Karel Jarvis

## 2020-10-02 NOTE — Telephone Encounter (Signed)
Was she up because of the headache? Can offer migraine cocktail, thanks

## 2020-10-02 NOTE — Telephone Encounter (Signed)
If she can do the 11:30 slot on Wed, March 16, can give to her, but pls impress need to show up (even if virtual - she did not pick up last time, make sure phone charged, etc). Thanks

## 2020-10-02 NOTE — Telephone Encounter (Signed)
Pt stated that she just couldn't sleep and that she has a head ache. She does not have a ride to come to [facility] for a headache cocktail,

## 2020-10-02 NOTE — Telephone Encounter (Signed)
Pt is sch for 10-04-20 with Karel Jarvis for a virtual visit

## 2020-10-02 NOTE — Telephone Encounter (Signed)
Patient called in stating she was up all night due to not being able to sleep. She has had a headache all night and still has it.

## 2020-10-04 ENCOUNTER — Telehealth (INDEPENDENT_AMBULATORY_CARE_PROVIDER_SITE_OTHER): Payer: Medicare Other | Admitting: Neurology

## 2020-10-04 ENCOUNTER — Encounter: Payer: Self-pay | Admitting: Neurology

## 2020-10-04 ENCOUNTER — Other Ambulatory Visit: Payer: Self-pay

## 2020-10-04 VITALS — Ht 62.0 in | Wt 145.0 lb

## 2020-10-04 DIAGNOSIS — G40019 Localization-related (focal) (partial) idiopathic epilepsy and epileptic syndromes with seizures of localized onset, intractable, without status epilepticus: Secondary | ICD-10-CM

## 2020-10-04 DIAGNOSIS — G43009 Migraine without aura, not intractable, without status migrainosus: Secondary | ICD-10-CM

## 2020-10-04 MED ORDER — NURTEC 75 MG PO TBDP: 1.0000 | ORAL_TABLET | ORAL | 11 refills | Status: DC | PRN

## 2020-10-04 MED ORDER — OXCARBAZEPINE 600 MG PO TABS
ORAL_TABLET | ORAL | 3 refills | Status: DC
Start: 2020-10-04 — End: 2021-08-01

## 2020-10-04 MED ORDER — TIZANIDINE HCL 4 MG PO TABS
ORAL_TABLET | ORAL | 11 refills | Status: DC
Start: 2020-10-04 — End: 2021-08-01

## 2020-10-04 MED ORDER — ZONISAMIDE 100 MG PO CAPS
ORAL_CAPSULE | ORAL | 3 refills | Status: DC
Start: 2020-10-04 — End: 2021-08-24

## 2020-10-04 MED ORDER — NORTRIPTYLINE HCL 10 MG PO CAPS
ORAL_CAPSULE | ORAL | 3 refills | Status: DC
Start: 2020-10-04 — End: 2021-08-01

## 2020-10-04 NOTE — Progress Notes (Signed)
Telephone (Audio) Visit The purpose of this telephone visit is to provide medical care while limiting exposure to the novel coronavirus.    Consent was obtained for telephone visit:  Yes.   Answered questions that patient had about telehealth interaction:  Yes.   I discussed the limitations, risks, security and privacy concerns of performing an evaluation and management service by telephone. I also discussed with the patient that there may be a patient responsible charge related to this service. The patient expressed understanding and agreed to proceed.  Pt location: Home Physician Location: office Name of referring provider:  Kerri Perches, MD I connected with .[name] at patients initiation/request on 10/04/2020 at 11:30 AM EDT by telephone and verified that I am speaking with the correct person using two identifiers.  Pt MRN:  [id number] Pt DOB:  [birth year]   History of Present Illness:  The patient had a telephone visit on 10/04/2020. She was last seen in the neurology clinic over a year ago for intractable epilepsy. Since her last visit, she has followed up with Dr. Sherlean Foot at [facility], last visit was in 03/2020. She is on oxcarbazepine 600mg  in AM, 1200mg  in PM and Zonisamide 500mg  qhs. She reports that she had been doing well with no seizures in a long time until last night when she had a nocturnal seizure, waking up with urinary incontinence, drool all over her pillow and shirt, and tongue bite. She cannot even remember the seizure prior to this one. Last reported seizure at [facility] was 04/01/20. She reports sleep deprivation the other night with a headache where she was crying from so much pain. She missed one dose of her medications not that many days ago. She sounds tired but able to answer questions appropriately. She is on nortriptyline 10mg  qhs for migraine prophylaxis with prn Nurtec. She has prn Tizanidine for neck pain. She does not drive.   Records from [facility] reviewed: She  had a PET scan in 01/2020: no seizure focus identified Video EEG (02/17/2020-02/23/2020) : She had one GTC with R-head deviation (appeared to be L-sided onset) and another episode with grunting and unclear shaking (pt off camera) with postictal agiatation, but no staring spells with oral and hand automatisms.Interictal EEG showed an abnormal EEG due tooccasional lefttemporoparietaland parietalsharp waves.  Neuropsychological evaluation in 12/2019: Abnormal but consistent with expectation given her overall intellectual functioning and limited educational attainment. She does have slightly better discrimination or recognition of visual versus verbal material. However, overall, there is not compelling evidence of lateralized dysfunction. Given her memory dysfunction, results suggest that she would be at a low risk of further memory impairment should she undergo temporal resection. There are no mood or psychiatric concerns of note and she has strong family support.    Oxcarbazepine level 03/2020: 36 (ref 10-35) Zonisamide level 03/2020: 24.2 (ref 10-40)   History from Initial Assessment 11/14/2017: This is a 40 year old right-handed woman with a history of migraines, back pain, and seizures, presenting to establish care. Seizures started around 2010 when she started having nocturnal convulsions with tongue bite and urinary incontinence. She would wake up with her children telling her what happened. She would be slightly confused but back to baseline quickly. She was on Keppra and Lamictal with no change in seizure frequency. At that time she was also drinking heavily, but even with decrease in alcohol intake, had not noticed any change in frequency. She was admitted to [facility] for 7 days in October 2012,  typical episodes were not captured. EMU report unavailable for review, on discharge summary it was noted there were incidental left-sided sharp waves of which there may or ma not be a correlation to her  spells. Keppra stopped and Lamictal dose increased. She was seeing neurologist Dr. Gerilyn Pilgrim, records unavailable for review, but it appears Fycompa was added to Lamictal. She reported worsening of seizures, now occurring in wakefulness, and was admitted for another 7-day EMU stay at [facility] in January 2018, again typical events were not captured. Baseline EEG was abnormal with intermittent left anterior to midtemporal slow waves, sharply contoured bursts of theta slowing over the left temporal region, as well as intermittent right temporal theta and delta slowing. There were also frequent 3-4 Hz very brief intermediate duration fluctuating runs of rhythmic delta activities (LRDA) maximal at F7. Occasional very brief runs were also seen maximal at F8.There were occasional independent left temporal and right temporal sharp waves. At times left temporal discharges have a wide field. There are ER visits in April, May, October, November 2018 for seizures, medications listed included Vimpat, Fycompa, and Oxtellar. Her last ER visit was on 11/05/17, taking Oxtellar 1200mg  BID without side effects. She reports compliance to medication.  She brings a calendar of her seizures in 2019. She had 2 in January, 2 in February, 1 in March, 3 so far in April. The last seizure occurred on 11/11/17 out of sleep, she woke up with a lip bite. She has had a lot of her seizures occur while playing at the [facility]. Family has witnessed the seizures, describing staring/unresponsiveness with lip smacking, then convulsions with head turn to the right. She denies any prior warning symptoms, no post-ictal focal weakness. Her entire body feels sore. No clear seizure triggers, she denies any sleep deprivation or missed medications. She has significantly cut down on alcohol, drinking 1 beer on the weekend.   She denies any olfactory/gustatory hallucinations, deja vu, rising epigastric sensation, focal numbness/tingling/weakness, myoclonic  jerks. She has migraines after a seizure and had a prescription for Norco. She also has Zipsor and Robaxin to take after seizures. She denies any dizziness, diplopia, dysarthria/dysphagia, neck/back pain, bowel/bladder dysfunction.   Epilepsy Risk Factors:  Her brother had seizures in childhood. She had a normal birth and early development.  There is no history of febrile convulsions, CNS infections such as meningitis/encephalitis, significant traumatic brain injury, neurosurgical procedures, or family history of seizures. She has a scar over the left temporal region after hitting her head on a window, she denies any loss of consciousness with this.   Prior AEDs: Lamictal, Keppra, Fycompa, Vimpat, Depakote Prior migraine rescue medications: sumatriptan, rizatriptan, naratriptan, Migranal nasal spray, Ubrelvy, Fioricet, Robaxin, flexeril, ibuprofen, naproxen, diclofenac.   Laboratory Data:  EEGs: Her EEG at [facility] had shown bilateral temporal slowing, as well as rhythmic delta at F7 and F8, independent left and right temporal sharp waves. MRI: MRI brain with and without contrast in 10/2008 was unremarkable with scatted white matter changes I personally reviewed MRI brain with and without contrast done 11/17/17 which did not show any acute changes, hippocampi symmetric with no abnormal signal or enhancement seen. There were several white matter changes bilaterally with no abnormal enhancement.     Current Outpatient Medications on File Prior to Visit  Medication Sig Dispense Refill  . famciclovir (FAMVIR) 250 MG tablet TAKE 1 TABLET BY MOUTH TWICE DAILY 28 tablet 10  . medroxyPROGESTERone (DEPO-PROVERA) 150 MG/ML injection Inject 63mL intramuscularly every 3 months, due for appt  1 mL 3  . nortriptyline (PAMELOR) 10 MG capsule Take 1 tablet by mouth every evening 90 capsule 3  . oxcarbazepine (TRILEPTAL) 600 MG tablet Take 1 tablet by mouth every morning & 2 in the evening 90 tablet 6  . Rimegepant  Sulfate (NURTEC) 75 MG TBDP Take 1 tablet by mouth as needed. Take 1 tablet as needed for migraine. Do not take more than 1 dose in 24 hours. 10 tablet 4  . tiZANidine (ZANAFLEX) 4 MG tablet Take 1 tablet by mouth every 6 hours as needed for muscle spasms. 30 tablet 5  .      . zonisamide (ZONEGRAN) 100 MG capsule Take 5 capsules by mouth every night 150 capsule 4   No current facility-administered medications on file prior to visit.       Observations/Objective:   Vitals:   10/04/20 1027  Weight: 145 lb (65.8 kg)  Height: 5\' 2"  (1.575 m)   Initially patient sounded drowsy and slow to respond, this slowly cleared up, she was able to answer questions appropriately with no confusion or dysarthria noted.   Assessment and Plan:   This is a 40 yo RH woman with a history of migraines and intractable epilepsy. Semiology suggestive of focal to bilateral tonic-clonic epilepsy arising from the left temporal region (staring/unresponsive with lip smacking followed by convulsion with head version to the right per family). She has not been seen in our office for over a year, last visit with [facility] was 6 months ago. She states she has not had any seizures in a long time until she had a nocturnal seizure last night. She had been sleep deprived and missed a dose of medication. We discussed avoidance of seizure triggers. She reports significant headaches, increase Zonisamide to 100mg  in AM, 500mg  in PM. Continue oxcarbazepine 600mg  in AM, 1200mg  in PM and nortriptyline 10mg  qhs. She has prn Nurtec and Tizanidine for migraine rescue. We discussed recommendation for proceeding with sEEG at [facility], she states that if she has another seizure, she will call to schedule. She does not drive. We may increase nortriptyline in the future if needed. Follow-up in 4 months, she knows to call for any changes.   Follow Up Instructions:   -I discussed the assessment and treatment plan with the patient. The patient was provided an  opportunity to ask questions and all were answered. The patient agreed with the plan and demonstrated an understanding of the instructions.   The patient was advised to call back or seek an in-person evaluation if the symptoms worsen or if the condition fails to improve as anticipated.    Total Time spent in visit with the patient was:  14:51 minutes, of which 100% of the time was spent in counseling and/or coordinating care on the above.   Pt understands and agrees with the plan of care outlined.     , MD

## 2020-10-05 ENCOUNTER — Telehealth: Payer: Self-pay

## 2020-10-05 NOTE — Telephone Encounter (Signed)
CAP  Copied Noted  Sleeved

## 2020-10-07 ENCOUNTER — Emergency Department (HOSPITAL_COMMUNITY)
Admission: EM | Admit: 2020-10-07 | Discharge: 2020-10-07 | Disposition: A | Discharge status: Home / Self Care | Payer: Medicare Other | Attending: Emergency Medicine | Admitting: Emergency Medicine

## 2020-10-07 ENCOUNTER — Other Ambulatory Visit: Payer: Self-pay

## 2020-10-07 ENCOUNTER — Emergency Department (HOSPITAL_COMMUNITY): Payer: Medicare Other

## 2020-10-07 ENCOUNTER — Encounter (HOSPITAL_COMMUNITY): Payer: Self-pay

## 2020-10-07 DIAGNOSIS — Y92410 Unspecified street and highway as the place of occurrence of the external cause: Secondary | ICD-10-CM | POA: Diagnosis not present

## 2020-10-07 DIAGNOSIS — Z041 Encounter for examination and observation following transport accident: Secondary | ICD-10-CM | POA: Diagnosis not present

## 2020-10-07 DIAGNOSIS — S39012A Strain of muscle, fascia and tendon of lower back, initial encounter: Secondary | ICD-10-CM | POA: Diagnosis not present

## 2020-10-07 DIAGNOSIS — S161XXA Strain of muscle, fascia and tendon at neck level, initial encounter: Secondary | ICD-10-CM | POA: Diagnosis not present

## 2020-10-07 DIAGNOSIS — F17210 Nicotine dependence, cigarettes, uncomplicated: Secondary | ICD-10-CM | POA: Diagnosis not present

## 2020-10-07 DIAGNOSIS — R519 Headache, unspecified: Secondary | ICD-10-CM | POA: Diagnosis not present

## 2020-10-07 DIAGNOSIS — S199XXA Unspecified injury of neck, initial encounter: Secondary | ICD-10-CM | POA: Diagnosis present

## 2020-10-07 DIAGNOSIS — M542 Cervicalgia: Secondary | ICD-10-CM | POA: Diagnosis not present

## 2020-10-07 DIAGNOSIS — M545 Low back pain, unspecified: Secondary | ICD-10-CM | POA: Diagnosis not present
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
## 2020-10-07 NOTE — Discharge Instructions (Addendum)
Take Tylenol or Motrin for pain and follow-up if not improving
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
## 2020-10-09 ENCOUNTER — Other Ambulatory Visit: Payer: Self-pay

## 2020-10-09 ENCOUNTER — Telehealth: Payer: Self-pay | Admitting: Neurology

## 2020-10-09 ENCOUNTER — Encounter: Payer: Self-pay | Admitting: Emergency Medicine

## 2020-10-09 ENCOUNTER — Ambulatory Visit: Admission: EM | Admit: 2020-10-09 | Discharge: 2020-10-09 | Payer: Medicare Other

## 2020-10-09 DIAGNOSIS — R569 Unspecified convulsions: Secondary | ICD-10-CM

## 2020-10-09 DIAGNOSIS — S0093XA Contusion of unspecified part of head, initial encounter: Secondary | ICD-10-CM
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
## 2020-10-09 NOTE — Telephone Encounter (Signed)
No answer at 156 10/09/2020

## 2020-10-09 NOTE — Telephone Encounter (Signed)
Patient is going to contact the pharmacy.

## 2020-10-09 NOTE — Telephone Encounter (Signed)
Patient called in returning [name]'s call 

## 2020-10-09 NOTE — ED Triage Notes (Signed)
Patient very nervous and crying.

## 2020-10-09 NOTE — Telephone Encounter (Signed)
Patient states that she was just seen in an urgent care because she fell and hit her head. She couldn't remember what medications she's supposed to be taking because she had a virtual visit with Dr Karel Jarvis and states that her medications were changed but is unsure to what. Please call.
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
## 2020-10-09 NOTE — Discharge Instructions (Addendum)
See your neurologist for evaluation.  Go to the ED to be seen now

## 2020-10-23 ENCOUNTER — Other Ambulatory Visit: Payer: Self-pay

## 2020-10-23 ENCOUNTER — Encounter: Payer: Self-pay | Admitting: Family Medicine

## 2020-10-23 ENCOUNTER — Ambulatory Visit (HOSPITAL_COMMUNITY)
Admission: RE | Admit: 2020-10-23 | Discharge: 2020-10-23 | Disposition: A | Discharge status: Home / Self Care | Payer: Medicare Other | Source: Ambulatory Visit | Attending: Family Medicine | Admitting: Family Medicine

## 2020-10-23 ENCOUNTER — Ambulatory Visit (INDEPENDENT_AMBULATORY_CARE_PROVIDER_SITE_OTHER): Payer: Medicare Other | Admitting: Family Medicine

## 2020-10-23 VITALS — BP 136/83 | HR 83 | Resp 16 | Ht 62.0 in | Wt 132.0 lb

## 2020-10-23 DIAGNOSIS — S90426A Blister (nonthermal), unspecified lesser toe(s), initial encounter: Secondary | ICD-10-CM

## 2020-10-23 DIAGNOSIS — F17210 Nicotine dependence, cigarettes, uncomplicated: Secondary | ICD-10-CM | POA: Diagnosis not present

## 2020-10-23 DIAGNOSIS — G40019 Localization-related (focal) (partial) idiopathic epilepsy and epileptic syndromes with seizures of localized onset, intractable, without status epilepticus: Secondary | ICD-10-CM | POA: Diagnosis not present

## 2020-10-23 DIAGNOSIS — Z8489 Family history of other specified conditions: Secondary | ICD-10-CM

## 2020-10-23 DIAGNOSIS — F17200 Nicotine dependence, unspecified, uncomplicated: Secondary | ICD-10-CM

## 2020-10-23 DIAGNOSIS — M79671 Pain in right foot: Secondary | ICD-10-CM | POA: Insufficient documentation

## 2020-10-23 DIAGNOSIS — M7989 Other specified soft tissue disorders: Secondary | ICD-10-CM | POA: Diagnosis not present

## 2020-10-23 MED ORDER — CEPHALEXIN 500 MG PO CAPS: 500.0000 mg | ORAL_CAPSULE | Freq: Two times a day (BID) | ORAL | 0 refills | Status: DC

## 2020-10-23 NOTE — Patient Instructions (Addendum)
Keep f/u as before, call if you need me sooner   please get X ray of foot today, also antibiotic is prescribed and you are referred to Podiatry for further evaluation  Careful not to fall  Please work on stopping smoking to improve your health   Steps to Quit Smoking Smoking tobacco is the leading cause of preventable death. It can affect almost every organ in the body. Smoking puts you and people around you at risk for many serious, long-lasting (chronic) diseases. Quitting smoking can be hard, but it is one of the best things that you can do for your health. It is never too late to quit. How do I get ready to quit? When you decide to quit smoking, make a plan to help you succeed. Before you quit:  Pick a date to quit. Set a date within the next 2 weeks to give you time to prepare.  Write down the reasons why you are quitting. Keep this list in places where you will see it often.  Tell your family, friends, and co-workers that you are quitting. Their support is important.  Talk with your doctor about the choices that may help you quit.  Find out if your health insurance will pay for these treatments.  Know the people, places, things, and activities that make you want to smoke (triggers). Avoid them. What first steps can I take to quit smoking?  Throw away all cigarettes at home, at work, and in your car.  Throw away the things that you use when you smoke, such as ashtrays and lighters.  Clean your car. Make sure to empty the ashtray.  Clean your home, including curtains and carpets. What can I do to help me quit smoking? Talk with your doctor about taking medicines and seeing a counselor at the same time. You are more likely to succeed when you do both.  If you are pregnant or breastfeeding, talk with your doctor about counseling or other ways to quit smoking. Do not take medicine to help you quit smoking unless your doctor tells you to do so. To quit smoking: Quit right  away  Quit smoking totally, instead of slowly cutting back on how much you smoke over a period of time.  Go to counseling. You are more likely to quit if you go to counseling sessions regularly. Take medicine You may take medicines to help you quit. Some medicines need a prescription, and some you can buy over-the-counter. Some medicines may contain a drug called nicotine to replace the nicotine in cigarettes. Medicines may:  Help you to stop having the desire to smoke (cravings).  Help to stop the problems that come when you stop smoking (withdrawal symptoms). Your doctor may ask you to use:  Nicotine patches, gum, or lozenges.  Nicotine inhalers or sprays.  Non-nicotine medicine that is taken by mouth. Find resources Find resources and other ways to help you quit smoking and remain smoke-free after you quit. These resources are most helpful when you use them often. They include:  Online chats with a Veterinary surgeon.  Phone quitlines.  Printed Materials engineer.  Support groups or group counseling.  Text messaging programs.  Mobile phone apps. Use apps on your mobile phone or tablet that can help you stick to your quit plan. There are many free apps for mobile phones and tablets as well as websites. Examples include Quit Guide from the [employer] and smokefree.gov   What things can I do to make it easier to quit?  Talk to your family and friends. Ask them to support and encourage you.  Call a phone quitline (1-800-QUIT-NOW), reach out to support groups, or work with a Veterinary surgeon.  Ask people who smoke to not smoke around you.  Avoid places that make you want to smoke, such as: ? Bars. ? Parties. ? Smoke-break areas at work.  Spend time with people who do not smoke.  Lower the stress in your life. Stress can make you want to smoke. Try these things to help your stress: ? Getting regular exercise. ? Doing deep-breathing exercises. ? Doing yoga. ? Meditating. ? Doing a body  scan. To do this, close your eyes, focus on one area of your body at a time from head to toe. Notice which parts of your body are tense. Try to relax the muscles in those areas.   How will I feel when I quit smoking? Day 1 to 3 weeks Within the first 24 hours, you may start to have some problems that come from quitting tobacco. These problems are very bad 2-3 days after you quit, but they do not often last for more than 2-3 weeks. You may get these symptoms:  Mood swings.  Feeling restless, nervous, angry, or annoyed.  Trouble concentrating.  Dizziness.  Strong desire for high-sugar foods and nicotine.  Weight gain.  Trouble pooping (constipation).  Feeling like you may vomit (nausea).  Coughing or a sore throat.  Changes in how the medicines that you take for other issues work in your body.  Depression.  Trouble sleeping (insomnia). Week 3 and afterward After the first 2-3 weeks of quitting, you may start to notice more positive results, such as:  Better sense of smell and taste.  Less coughing and sore throat.  Slower heart rate.  Lower blood pressure.  Clearer skin.  Better breathing.  Fewer sick days. Quitting smoking can be hard. Do not give up if you fail the first time. Some people need to try a few times before they succeed. Do your best to stick to your quit plan, and talk with your doctor if you have any questions or concerns. Summary  Smoking tobacco is the leading cause of preventable death. Quitting smoking can be hard, but it is one of the best things that you can do for your health.  When you decide to quit smoking, make a plan to help you succeed.  Quit smoking right away, not slowly over a period of time.  When you start quitting, seek help from your doctor, family, or friends. This information is not intended to replace advice given to you by your health care provider. Make sure you discuss any questions you have with your health care  provider. Document Revised: 04/02/2019 Document Reviewed: 09/26/2018 Elsevier Patient Education  2021 [employer].

## 2020-10-23 NOTE — Assessment & Plan Note (Signed)
X ray, antibiotic and podiatry referral

## 2020-10-23 NOTE — Telephone Encounter (Signed)
Completed.

## 2020-10-25 ENCOUNTER — Telehealth: Payer: Self-pay

## 2020-10-25 NOTE — Telephone Encounter (Signed)
Patient called asking for foot xray results- results came back today, can you let me know what they say and I'll call pt

## 2020-10-25 NOTE — Telephone Encounter (Signed)
No fracture or dislocation. She should keep her appointment with podiatry.

## 2020-10-25 NOTE — Telephone Encounter (Signed)
Patient called to get results of her foot xray. Please return patient call [phone]

## 2020-10-26 NOTE — Telephone Encounter (Signed)
Pt informed

## 2020-11-01 ENCOUNTER — Other Ambulatory Visit: Payer: Self-pay

## 2020-11-01 ENCOUNTER — Ambulatory Visit (INDEPENDENT_AMBULATORY_CARE_PROVIDER_SITE_OTHER): Payer: Medicare Other | Admitting: Family Medicine

## 2020-11-01 ENCOUNTER — Encounter: Payer: Self-pay | Admitting: Family Medicine

## 2020-11-01 DIAGNOSIS — E559 Vitamin D deficiency, unspecified: Secondary | ICD-10-CM | POA: Diagnosis not present

## 2020-11-01 DIAGNOSIS — Z79899 Other long term (current) drug therapy: Secondary | ICD-10-CM | POA: Diagnosis not present

## 2020-11-01 DIAGNOSIS — F322 Major depressive disorder, single episode, severe without psychotic features: Secondary | ICD-10-CM

## 2020-11-01 DIAGNOSIS — F17200 Nicotine dependence, unspecified, uncomplicated: Secondary | ICD-10-CM

## 2020-11-01 DIAGNOSIS — Z1159 Encounter for screening for other viral diseases: Secondary | ICD-10-CM | POA: Diagnosis not present

## 2020-11-01 DIAGNOSIS — R569 Unspecified convulsions: Secondary | ICD-10-CM | POA: Diagnosis not present

## 2020-11-01 DIAGNOSIS — S90426A Blister (nonthermal), unspecified lesser toe(s), initial encounter: Secondary | ICD-10-CM

## 2020-11-01 DIAGNOSIS — F17210 Nicotine dependence, cigarettes, uncomplicated: Secondary | ICD-10-CM | POA: Diagnosis not present

## 2020-11-01 MED ORDER — NICOTINE 21 MG/24HR TD PT24: 21.0000 mg | MEDICATED_PATCH | Freq: Every day | TRANSDERMAL | 1 refills | Status: DC

## 2020-11-01 NOTE — Patient Instructions (Signed)
F/u in 7 weeks , re evaluate smoking, weight  And depression  Labs ordered in  January to be drawn today  Please give appointment info for dr Prudy Feeler at checkout    New is the Nicoderm patch to help with stopping smoking, start using this on the day you decide to stop cigarettes, do not smoke and use the patch  Call 1800 QUITNOW to get help 24/7 with things to do to help you to stop smoking  It is important that you exercise regularly at least 30 minutes 5 times a week. If you develop chest pain, have severe difficulty breathing, or feel very tired, stop exercising immediately and seek medical attention   Thanks for choosing [facility], we consider it a privelige to serve you.

## 2020-11-02 ENCOUNTER — Encounter: Payer: Self-pay | Admitting: Family Medicine

## 2020-11-02 ENCOUNTER — Other Ambulatory Visit: Payer: Self-pay | Admitting: Family Medicine

## 2020-11-02 DIAGNOSIS — S90426A Blister (nonthermal), unspecified lesser toe(s), initial encounter: Secondary | ICD-10-CM | POA: Insufficient documentation

## 2020-11-02 LAB — CMP14+EGFR
ALT: 11 IU/L (ref 0–32)
AST: 17 IU/L (ref 0–40)
Albumin/Globulin Ratio: 1.4 (ref 1.2–2.2)
Albumin: 4.1 g/dL (ref 3.8–4.8)
Alkaline Phosphatase: 103 IU/L (ref 44–121)
BUN/Creatinine Ratio: 8 — ABNORMAL LOW (ref 9–23)
BUN: 6 mg/dL (ref 6–24)
Bilirubin Total: <0.2 mg/dL (ref 0.0–1.2)
CO2: 17 mmol/L — ABNORMAL LOW (ref 20–29)
Calcium: 9.4 mg/dL (ref 8.7–10.2)
Chloride: 102 mmol/L (ref 96–106)
Creatinine, Ser: 0.72 mg/dL (ref 0.57–1.00)
Globulin, Total: 3 g/dL (ref 1.5–4.5)
Glucose: 95 mg/dL (ref 65–99)
Potassium: 4.1 mmol/L (ref 3.5–5.2)
Sodium: 136 mmol/L (ref 134–144)
Total Protein: 7.1 g/dL (ref 6.0–8.5)
eGFR: 108 mL/min/{1.73_m2} (ref >59)

## 2020-11-02 LAB — LIPID PANEL
Chol/HDL Ratio: 3 ratio (ref 0.0–4.4)
Cholesterol, Total: 213 mg/dL — ABNORMAL HIGH (ref 100–199)
HDL: 70 mg/dL (ref >39)
LDL Chol Calc (NIH): 126 mg/dL — ABNORMAL HIGH (ref 0–99)
Triglycerides: 94 mg/dL (ref 0–149)
VLDL Cholesterol Cal: 17 mg/dL (ref 5–40)

## 2020-11-02 LAB — VITAMIN D 25 HYDROXY (VIT D DEFICIENCY, FRACTURES): Vit D, 25-Hydroxy: 11.3 ng/mL — ABNORMAL LOW (ref 30.0–100.0)

## 2020-11-02 LAB — HEPATITIS C ANTIBODY: Hep C Virus Ab: <0.1 s/co ratio (ref 0.0–0.9)

## 2020-11-02 LAB — TSH: TSH: 0.774 u[IU]/mL (ref 0.450–4.500)

## 2020-11-02 MED ORDER — ERGOCALCIFEROL 1.25 MG (50000 UT) PO CAPS: 50000.0000 [IU] | ORAL_CAPSULE | ORAL | 1 refills | Status: DC

## 2020-11-02 NOTE — Assessment & Plan Note (Signed)
Asked:confirms currently smokes cigarettes, 1 PPD Assess: Unwilling to set a quit date, but is cutting back Advise: needs to QUIT to reduce risk of cancer, cardio and cerebrovascular disease Assist: counseled for 5 minutes and literature provided Arrange: follow up in 2 to 4 months  

## 2020-11-02 NOTE — Assessment & Plan Note (Signed)
Antibiotic prescribed. Xray and Podiatry eval

## 2020-11-02 NOTE — Assessment & Plan Note (Signed)
Uncontrolled , followed by neurology, recnet seizure reported

## 2020-11-02 NOTE — Progress Notes (Signed)
   [name]     MRN: [id number]      DOB: 1981/05/20   HPI [name] is here with a c/o blister to right 3rd toe after falling 2 weeks ago, feels this is  Result of the fall. Denies any drainage from the area, just discoloration, no fever or chills  ROS Denies recent fever or chills. Denies sinus pressure, nasal congestion, ear pain or sore throat. Denies chest congestion, productive cough or wheezing. Denies chest pains, palpitations and leg swelling Denies abdominal pain, nausea, vomiting,diarrhea or constipation.   Denies dysuria, frequency, hesitancy or incontinence. Denies joint pain, swelling and limitation in mobility.  Denies uncontrolled  depression, anxiety or insomnia. [name]   PE  BP 136/83   Pulse 83   Resp 16   Ht 5\' 2"  (1.575 m)   Wt 132 lb (59.9 kg)   SpO2 96%   BMI 24.14 kg/m   Patient alert and oriented and in no cardiopulmonary distress.  HEENT: No facial asymmetry, EOMI,     Neck supple .  Chest: Clear to auscultation bilaterally.  CVS: S1, S2 no murmurs, no S3.Regular rate.  ABD: Soft non tender.   Ext: No edema  MS: Adequate ROM spine, shoulders, hips and knees.  Skin: Intact, intact blister with old blood / bruise of right 3rd toe, mildly tender, no drainage noted  Psych: Good eye contact, normal affect. Memory intact not anxious or depressed appearing.  CNS: CN 2-12 intact, power,  normal throughout.no focal deficits noted.   Assessment & Plan  Right foot pain X ray, antibiotic and podiatry referral  Blister of third toe Antibiotic prescribed. Xray and Podiatry eval  Localization-related (focal) (partial) idiopathic epilepsy and epileptic syndromes with seizures of localized onset, intractable, without status epilepticus (HCC) Uncontrolled , followed by neurology, recnet seizure reported  NICOTINE ADDICTION Asked:confirms currently smokes cigarettes, 1 PPD Assess: Unwilling to set a quit date, but is cutting back Advise:  needs to QUIT to reduce risk of cancer, cardio and cerebrovascular disease Assist: counseled for 5 minutes and literature provided Arrange: follow up in 2 to 4 months

## 2020-11-03 ENCOUNTER — Encounter: Payer: Self-pay | Admitting: Family Medicine

## 2020-11-03 NOTE — Assessment & Plan Note (Signed)
Uncontrolled, reports recent seizure , followed by Neurology

## 2020-11-03 NOTE — Assessment & Plan Note (Signed)
Reduced size and tenderness, f/u with Podiatry, still symptomatic

## 2020-11-03 NOTE — Assessment & Plan Note (Signed)
Asked:confirms currently smokes cigarettes 1 PPD Assess: Unwilling to set a quit date, but is cutting back and will start patches Advise: needs to QUIT to reduce risk of cancer, cardio and cerebrovascular disease Assist: counseled for 5 minutes and literature provided Arrange: follow up in 2 to 4 months

## 2020-11-03 NOTE — Progress Notes (Signed)
   [name]     MRN: [id number]      DOB: [birth year]   HPI [name] is here for follow up of depression and smoking cessation Marked improvement on medication and denies depression Wants to quit smoking , now smokes 1 PPD. The PT denies any adverse reactions to current medications since the last visit.  Blister on toe smaller but still present has appt with Podiatry  ROS Denies recent fever or chills. Denies sinus pressure, nasal congestion, ear pain or sore throat. Denies chest congestion, productive cough or wheezing. Denies chest pains, palpitations and leg swelling Denies abdominal pain, nausea, vomiting,diarrhea or constipation.   Denies dysuria, frequency, hesitancy or incontinence. Denies joint pain, swelling and limitation in mobility. Denies headaches,  numbness, or tingling. Denies uncontrolled depression, anxiety or insomnia. Denies skin break down or rash.   PE  BP 118/75   Pulse 91   Resp 15   Ht 5\' 2"  (1.575 m)   Wt 134 lb 6.4 oz (61 kg)   SpO2 98%   BMI 24.58 kg/m   Patient alert and oriented and in no cardiopulmonary distress.  HEENT: No facial asymmetry, EOMI,     Neck supple .  Chest: Clear to auscultation bilaterally.  CVS: S1, S2 no murmurs, no S3.Regular rate.  ABD: Soft non tender.   Ext: No edema  MS: Adequate ROM spine, shoulders, hips and knees.  Skin: Intact, no ulcerations or rash noted. Reduced size of blister on toe, non tender Psych: Good eye contact, normal affect. Memory intact not anxious or depressed appearing.  CNS: CN 2-12 intact, power,  normal throughout.no focal deficits noted.   Assessment & Plan  Blister of third toe Reduced size and tenderness, f/u with Podiatry, still symptomatic  Depression, major, single episode, severe (HCC) Marked improvement on medication, continue same  NICOTINE ADDICTION Asked:confirms currently smokes cigarettes 1 PPD Assess: Unwilling to set a quit date, but is cutting back  and will start patches Advise: needs to QUIT to reduce risk of cancer, cardio and cerebrovascular disease Assist: counseled for 5 minutes and literature provided Arrange: follow up in 2 to 4 months   Seizure (HCC) Uncontrolled, reports recent seizure , followed by Neurology

## 2020-11-03 NOTE — Assessment & Plan Note (Signed)
Marked improvement on medication, continue same ?

## 2020-11-21 ENCOUNTER — Ambulatory Visit (INDEPENDENT_AMBULATORY_CARE_PROVIDER_SITE_OTHER): Payer: Medicare Other

## 2020-11-21 ENCOUNTER — Other Ambulatory Visit: Payer: Self-pay

## 2020-11-21 DIAGNOSIS — Z3042 Encounter for surveillance of injectable contraceptive: Secondary | ICD-10-CM

## 2020-11-21 MED ORDER — MEDROXYPROGESTERONE ACETATE 150 MG/ML IM SUSP
150.0000 mg | Freq: Once | INTRAMUSCULAR | Status: AC
  Administered 2020-11-21: 150 mg via INTRAMUSCULAR

## 2020-11-21 NOTE — Progress Notes (Signed)
   NURSE VISIT- INJECTION  SUBJECTIVE:  [name] is a 40 y.o. G3P3 female here for a Depo Provera for contraception/period management. She is a GYN patient.   OBJECTIVE:  There were no vitals taken for this visit.  Appears well, in no apparent distress  Injection administered in: Left deltoid  Meds ordered this encounter  Medications  . medroxyPROGESTERone (DEPO-PROVERA) injection 150 mg    ASSESSMENT: GYN patient Depo Provera for contraception/period management PLAN: Follow-up: in 11-13 weeks for next Depo   [name]  11/21/2020 2:19 PM

## 2020-11-29 ENCOUNTER — Telehealth: Payer: Self-pay | Admitting: Family Medicine

## 2020-11-29 DIAGNOSIS — M79671 Pain in right foot: Secondary | ICD-10-CM | POA: Diagnosis not present

## 2020-11-29 DIAGNOSIS — S90222A Contusion of left lesser toe(s) with damage to nail, initial encounter: Secondary | ICD-10-CM | POA: Diagnosis not present

## 2020-11-29 DIAGNOSIS — M79674 Pain in right toe(s): Secondary | ICD-10-CM | POA: Diagnosis not present

## 2020-11-29 NOTE — Telephone Encounter (Signed)
Left message for patient to call back and schedule Medicare Annual Wellness Visit (AWV) either virtually or in office.   Last AWV 11/24/18 please schedule at anytime with [facility]  health coach  This should be a 40 minute visit.

## 2020-12-12 NOTE — Progress Notes (Signed)
Subjective:   [name] is a 40 y.o. female who presents for Medicare Annual (Subsequent) preventive examination.  I connected with [name] today by telephone and verified that I am speaking with the correct person using two identifiers. Location patient: home Location provider: work Persons participating in the virtual visit: patient, provider.   I discussed the limitations, risks, security and privacy concerns of performing an evaluation and management service by telephone and the availability of in person appointments. I also discussed with the patient that there may be a patient responsible charge related to this service. The patient expressed understanding and verbally consented to this telephonic visit.    Interactive audio and video telecommunications were attempted between this provider and patient, however failed, due to patient having technical difficulties OR patient did not have access to video capability.  We continued and completed visit with audio only.      Review of Systems    N/A        Objective:    Today's Vitals   There is no height or weight on file to calculate BMI.  Advanced Directives 12/13/2020 10/07/2020 10/04/2020 03/03/2020 01/14/2019 12/07/2018 12/03/2018  Does Patient Have a Medical Advance Directive? No No No No No No No  Would patient like information on creating a medical advance directive? No - Patient declined No - Patient declined - - - - No - Patient declined    Current Medications (verified) Outpatient Encounter Medications as of 12/13/2020  Medication Sig  . ergocalciferol (VITAMIN D2) 1.25 MG (50000 UT) capsule Take 1 capsule (50,000 Units total) by mouth once a week. One capsule once weekly  . famciclovir (FAMVIR) 250 MG tablet TAKE 1 TABLET BY MOUTH TWICE DAILY  . medroxyPROGESTERone (DEPO-PROVERA) 150 MG/ML injection Inject 1mL intramuscularly every 3 months, due for appt  . nortriptyline (PAMELOR) 10 MG capsule Take 1 tablet  every night  . oxcarbazepine (TRILEPTAL) 600 MG tablet Take 1 tablet by mouth every morning & 2 in the evening  . Rimegepant Sulfate (NURTEC) 75 MG TBDP Take 1 tablet by mouth as needed. Take 1 tablet as needed for migraine. Do not take more than 1 dose in 24 hours.  [name]. tiZANidine (ZANAFLEX) 4 MG tablet Take 1 tablet by mouth every 6 hours as needed for muscle spasms.  [name]. zonisamide (ZONEGRAN) 100 MG capsule Take 1 cap in AM, 5 caps in PM  . nicotine (NICODERM CQ) 21 mg/24hr patch Place 1 patch (21 mg total) onto the skin daily. (Patient not taking: Reported on 12/13/2020)   No facility-administered encounter medications on file as of 12/13/2020.    Allergies (verified) Morphine   History: Past Medical History:  Diagnosis Date  . Back pain   . Chronic headaches   . Depression   . Genital warts    herpes  . Herpes   . Seizures (HCC) 03/15/2016   Past Surgical History:  Procedure Laterality Date  . CESAREAN SECTION     x 3   Family History  Problem Relation Age of Onset  . Hypertension Mother   . Healthy Father    Social History   Socioeconomic History  . Marital status: Single    Spouse name: Not on file  . Number of children: 2  . Years of education: 2010, GED  . Highest education level: Not on file  Occupational History    Comment: na  Tobacco Use  . Smoking status: Current Every Day Smoker    Packs/day: 1.00  Years: 10.00    Pack years: 10.00    Types: Cigarettes  . Smokeless tobacco: Never Used  Vaping Use  . Vaping Use: Never used  Substance and Sexual Activity  . Alcohol use: Not Currently    Alcohol/week: 2.0 standard drinks    Types: 2 Cans of beer per week    Comment: last drink in December  . Drug use: Yes    Types: Marijuana    Comment: everyday  . Sexual activity: Not Currently    Birth control/protection: Injection  Other Topics Concern  . Not on file  Social History Narrative   Lives  With 1 child in home      Caffeine -Pepsi 2 daily        Right handed      Highest level of edu- 11 grade      Disabled   Social Determinants of Health   Financial Resource Strain: Low Risk   . Difficulty of Paying Living Expenses: Not hard at all  Food Insecurity: No Food Insecurity  . Worried About Programme researcher, broadcasting/film/video in the Last Year: Never true  . Ran Out of Food in the Last Year: Never true  Transportation Needs: No Transportation Needs  . Lack of Transportation (Medical): No  . Lack of Transportation (Non-Medical): No  Physical Activity: Inactive  . Days of Exercise per Week: 0 days  . Minutes of Exercise per Session: 0 min  Stress: No Stress Concern Present  . Feeling of Stress : Not at all  Social Connections: Socially Isolated  . Frequency of Communication with Friends and Family: More than three times a week  . Frequency of Social Gatherings with Friends and Family: More than three times a week  . Attends Religious Services: Never  . Active Member of Clubs or Organizations: No  . Attends Banker Meetings: Never  . Marital Status: Never married    Tobacco Counseling Ready to quit: Not Answered Counseling given: Not Answered   Clinical Intake:  Pre-visit preparation completed: Yes  Pain : No/denies pain     Nutritional Risks: None Diabetes: No  How often do you need to have someone help you when you read instructions, pamphlets, or other written materials from your doctor or pharmacy?: 5 - Always What is the last grade level you completed in school?: 11th grade  Diabetic?No     Information entered by :: SCrews,LPN   Activities of Daily Living In your present state of health, do you have any difficulty performing the following activities: 12/13/2020  Hearing? Y  Vision? N  Difficulty concentrating or making decisions? N  Walking or climbing stairs? N  Dressing or bathing? N  Doing errands, shopping? Y  Preparing Food and eating ? N  Using the Toilet? N  In the past six months, have you  accidently leaked urine? N  Do you have problems with loss of bowel control? N  Managing your Medications? Y  Comment gets bubble pack  Managing your Finances? N  Housekeeping or managing your Housekeeping? N  Some recent data might be hidden    Patient Care Team: Kerri Perches, MD as PCP - General Beryle Beams, MD as Consulting Physician (Neurology) Tilda Burrow, MD (Inactive) as Consulting Physician (Obstetrics and Gynecology) Van Clines, MD as Consulting Physician (Neurology)  Indicate any recent Medical Services you may have received from other than [facility] providers in the past year (date may be approximate).     Assessment:  This is a routine wellness examination for Taquita.  Hearing/Vision screen  Hearing Screening   125Hz  250Hz  500Hz  1000Hz  2000Hz  3000Hz  4000Hz  6000Hz  8000Hz   Right ear:           Left ear:           Vision Screening Comments: Patient states has not seen an eye doctor. In process of getting a eye appointment  Dietary issues and exercise activities discussed: Current Exercise Habits: Home exercise routine, Type of exercise: walking, Time (Minutes): 30, Frequency (Times/Week): 3, Weekly Exercise (Minutes/Week): 90, Intensity: Mild, Exercise limited by: neurologic condition(s)  Goals Addressed              This Visit's Progress   .  Increase water intake (pt-stated)   Not on track     She would like to start drinking 32 ounces of water a day.    .  Quit smoking / using tobacco (pt-stated)   Not on track     She would like to cut back to half a pack of cigarettes a day and slowly wean herself off completely.      Depression Screen PHQ 2/9 Scores 12/13/2020 11/01/2020 04/27/2020 04/27/2020 04/27/2020 04/27/2020 11/24/2018  PHQ - 2 Score 0 0 4 6 6  0 0  PHQ- 9 Score - - 18 21 17  - -    Fall Risk Fall Risk  12/13/2020 11/01/2020 10/23/2020 10/04/2020 04/27/2020  Falls in the past year? 1 1 1  0 0  Comment - - - - -  Number falls in past yr: 1 1  1  0 0  Injury with Fall? 1 0 0 0 0  Comment - - - - -  Risk Factor Category  - - - - -  Risk for fall due to : Impaired balance/gait;Medication side effect - - - -  Risk for fall due to: Comment - - - - -  Follow up Falls evaluation completed;Falls prevention discussed - - - -    FALL RISK PREVENTION PERTAINING TO THE HOME:  Any stairs in or around the home? Yes  If so, are there any without handrails? No  Home free of loose throw rugs in walkways, pet beds, electrical cords, etc? Yes  Adequate lighting in your home to reduce risk of falls? Yes   ASSISTIVE DEVICES UTILIZED TO PREVENT FALLS:  Life alert? No  Use of a cane, walker or w/c? Yes  Grab bars in the bathroom? Yes  Shower chair or bench in shower? No  Elevated toilet seat or a handicapped toilet? No    Cognitive Function:     6CIT Screen 12/13/2020 11/24/2018 08/05/2016  What Year? 0 points 0 points 0 points  What month? 0 points 0 points 0 points  What time? 0 points 0 points 0 points  Count back from 20 4 points 0 points 0 points  Months in reverse 4 points 0 points 0 points  Repeat phrase 4 points 0 points 0 points  Total Score 12 0 0    Immunizations Immunization History  Administered Date(s) Administered  . Influenza Split 04/22/2012  . Influenza Whole 05/26/2009  . Influenza, Seasonal, Injecte, Preservative Fre 05/06/2019  . Influenza,inj,Quad PF,6+ Mos 04/22/2013, 06/01/2014, 09/20/2015, 08/05/2016, 07/23/2018, 05/04/2019, 04/27/2020  . Moderna Sars-Covid-2 Vaccination 08/03/2020, 08/31/2020  . Td 07/27/2009  . Tdap 09/21/2011    TDAP status: Up to date  Flu Vaccine status: Up to date  Pneumococcal vaccine status: Up to date  Covid-19 vaccine status: Completed vaccines  Qualifies  for Shingles Vaccine? No   Zostavax completed No   Shingrix Completed?: No.    Education has been provided regarding the importance of this vaccine. Patient has been advised to call insurance company to determine out of  pocket expense if they have not yet received this vaccine. Advised may also receive vaccine at local pharmacy or Health Dept. Verbalized acceptance and understanding.  Screening Tests Health Maintenance  Topic Date Due  . MAMMOGRAM  Never done  . COVID-19 Vaccine (3 - Booster for Moderna series) 01/28/2021  . INFLUENZA VACCINE  02/19/2021  . TETANUS/TDAP  09/20/2021  . PAP SMEAR-Modifier  02/23/2022  . Hepatitis C Screening  Completed  . HIV Screening  Completed  . HPV VACCINES  Aged Out    Health Maintenance  Health Maintenance Due  Topic Date Due  . MAMMOGRAM  Never done    Colorectal Cancer Screening: Not due until age 37  Mammogram status: Ordered 12/13/2020. Pt provided with contact info and advised to call to schedule appt.   Bone Density Screening: Not due until age 38  Lung Cancer Screening: (Low Dose CT Chest recommended if Age 36-80 years, 30 pack-year currently smoking OR have quit w/in 15years.) does qualify.   Lung Cancer Screening Referral: N/A  Additional Screening:  Hepatitis C Screening: does qualify; Completed 11/01/2020  Vision Screening: Recommended annual ophthalmology exams for early detection of glaucoma and other disorders of the eye. Is the patient up to date with their annual eye exam?  No  Who is the provider or what is the name of the office in which the patient attends annual eye exams? Dr. Charise Killian If pt is not established with a provider, would they like to be referred to a provider to establish care? No .   Dental Screening: Recommended annual dental exams for proper oral hygiene  Community Resource Referral / Chronic Care Management: CRR required this visit?  No   CCM required this visit?  No      Plan:     I have personally reviewed and noted the following in the patient's chart:   . Medical and social history . Use of alcohol, tobacco or illicit drugs  . Current medications and supplements including opioid prescriptions.   . Functional ability and status . Nutritional status . Physical activity . Advanced directives . List of other physicians . Hospitalizations, surgeries, and ER visits in previous 12 months . Vitals . Screenings to include cognitive, depression, and falls . Referrals and appointments  In addition, I have reviewed and discussed with patient certain preventive protocols, quality metrics, and best practice recommendations. A written personalized care plan for preventive services as well as general preventive health recommendations were provided to patient.     Theodora Blow, LPN   01/09/3085   Nurse Notes: None

## 2020-12-13 ENCOUNTER — Ambulatory Visit (INDEPENDENT_AMBULATORY_CARE_PROVIDER_SITE_OTHER): Payer: Medicare Other

## 2020-12-13 DIAGNOSIS — Z1231 Encounter for screening mammogram for malignant neoplasm of breast: Secondary | ICD-10-CM | POA: Diagnosis not present

## 2020-12-13 DIAGNOSIS — Z0000 Encounter for general adult medical examination without abnormal findings: Secondary | ICD-10-CM

## 2020-12-13 NOTE — Patient Instructions (Signed)
[name] , Thank you for taking time to come for your Medicare Wellness Visit. I appreciate your ongoing commitment to your health goals. Please review the following plan we discussed and let me know if I can assist you in the future.   Screening recommendations/referrals: Colonoscopy: Not due until age 42 Mammogram: Currently due for mammogram, orders placed we will get you scheduled  Bone Density: Not due until age age 20 Recommended yearly ophthalmology/optometry visit for glaucoma screening and checkup Recommended yearly dental visit for hygiene and checkup  Vaccinations: Influenza vaccine: Up to date, next due fall 2022 Pneumococcal vaccine: Not due until age 72 Tdap vaccine: Up to date, next due 09/20/2021 Shingles vaccine: Not due until age 50     Advanced directives: Advance directive discussed with you today. Even though you declined this today please call our office should you change your mind and we can give you the proper paperwork for you to fill out.   Conditions/risks identified: None  Next appointment: 12/19/2021 @ 1:00 PM with [place] Years, Female Preventive care refers to lifestyle choices and visits with your health care provider that can promote health and wellness. What does preventive care include?  A yearly physical exam. This is also called an annual well check.  Dental exams once or twice a year.  Routine eye exams. Ask your health care provider how often you should have your eyes checked.  Personal lifestyle choices, including:  Daily care of your teeth and gums.  Regular physical activity.  Eating a healthy diet.  Avoiding tobacco and drug use.  Limiting alcohol use.  Practicing safe sex.  Taking low-dose aspirin daily starting at age 98.  Taking vitamin and mineral supplements as recommended by your health care provider. What happens during an annual well check? The services and screenings done by  your health care provider during your annual well check will depend on your age, overall health, lifestyle risk factors, and family history of disease. Counseling  Your health care provider may ask you questions about your:  Alcohol use.  Tobacco use.  Drug use.  Emotional well-being.  Home and relationship well-being.  Sexual activity.  Eating habits.  Work and work Statistician.  Method of birth control.  Menstrual cycle.  Pregnancy history. Screening  You may have the following tests or measurements:  Height, weight, and BMI.  Blood pressure.  Lipid and cholesterol levels. These may be checked every 5 years, or more frequently if you are over 37 years old.  Skin check.  Lung cancer screening. You may have this screening every year starting at age 49 if you have a 30-pack-year history of smoking and currently smoke or have quit within the past 15 years.  Fecal occult blood test (FOBT) of the stool. You may have this test every year starting at age 70.  Flexible sigmoidoscopy or colonoscopy. You may have a sigmoidoscopy every 5 years or a colonoscopy every 10 years starting at age 16.  Hepatitis C blood test.  Hepatitis B blood test.  Sexually transmitted disease (STD) testing.  Diabetes screening. This is done by checking your blood sugar (glucose) after you have not eaten for a while (fasting). You may have this done every 1-3 years.  Mammogram. This may be done every 1-2 years. Talk to your health care provider about when you should start having regular mammograms. This may depend on whether you have a family history of breast cancer.  BRCA-related cancer  screening. This may be done if you have a family history of breast, ovarian, tubal, or peritoneal cancers.  Pelvic exam and Pap test. This may be done every 3 years starting at age 7. Starting at age 71, this may be done every 5 years if you have a Pap test in combination with an HPV test.  Bone density  scan. This is done to screen for osteoporosis. You may have this scan if you are at high risk for osteoporosis. Discuss your test results, treatment options, and if necessary, the need for more tests with your health care provider. Vaccines  Your health care provider may recommend certain vaccines, such as:  Influenza vaccine. This is recommended every year.  Tetanus, diphtheria, and acellular pertussis (Tdap, Td) vaccine. You may need a Td booster every 10 years.  Zoster vaccine. You may need this after age 29.  Pneumococcal 13-valent conjugate (PCV13) vaccine. You may need this if you have certain conditions and were not previously vaccinated.  Pneumococcal polysaccharide (PPSV23) vaccine. You may need one or two doses if you smoke cigarettes or if you have certain conditions. Talk to your health care provider about which screenings and vaccines you need and how often you need them. This information is not intended to replace advice given to you by your health care provider. Make sure you discuss any questions you have with your health care provider. Document Released: 08/04/2015 Document Revised: 03/27/2016 Document Reviewed: 05/09/2015 Elsevier Interactive Patient Education  2017 [place] Prevention in the Home Falls can cause injuries. They can happen to people of all ages. There are many things you can do to make your home safe and to help prevent falls. What can I do on the outside of my home?  Regularly fix the edges of walkways and driveways and fix any cracks.  Remove anything that might make you trip as you walk through a door, such as a raised step or threshold.  Trim any bushes or trees on the path to your home.  Use bright outdoor lighting.  Clear any walking paths of anything that might make someone trip, such as rocks or tools.  Regularly check to see if handrails are loose or broken. Make sure that both sides of any steps have handrails.  Any raised  decks and porches should have guardrails on the edges.  Have any leaves, snow, or ice cleared regularly.  Use sand or salt on walking paths during winter.  Clean up any spills in your garage right away. This includes oil or grease spills. What can I do in the bathroom?  Use night lights.  Install grab bars by the toilet and in the tub and shower. Do not use towel bars as grab bars.  Use non-skid mats or decals in the tub or shower.  If you need to sit down in the shower, use a plastic, non-slip stool.  Keep the floor dry. Clean up any water that spills on the floor as soon as it happens.  Remove soap buildup in the tub or shower regularly.  Attach bath mats securely with double-sided non-slip rug tape.  Do not have throw rugs and other things on the floor that can make you trip. What can I do in the bedroom?  Use night lights.  Make sure that you have a light by your bed that is easy to reach.  Do not use any sheets or blankets that are too big for your bed. They should not  hang down onto the floor.  Have a firm chair that has side arms. You can use this for support while you get dressed.  Do not have throw rugs and other things on the floor that can make you trip. What can I do in the kitchen?  Clean up any spills right away.  Avoid walking on wet floors.  Keep items that you use a lot in easy-to-reach places.  If you need to reach something above you, use a strong step stool that has a grab bar.  Keep electrical cords out of the way.  Do not use floor polish or wax that makes floors slippery. If you must use wax, use non-skid floor wax.  Do not have throw rugs and other things on the floor that can make you trip. What can I do with my stairs?  Do not leave any items on the stairs.  Make sure that there are handrails on both sides of the stairs and use them. Fix handrails that are broken or loose. Make sure that handrails are as long as the stairways.  Check  any carpeting to make sure that it is firmly attached to the stairs. Fix any carpet that is loose or worn.  Avoid having throw rugs at the top or bottom of the stairs. If you do have throw rugs, attach them to the floor with carpet tape.  Make sure that you have a light switch at the top of the stairs and the bottom of the stairs. If you do not have them, ask someone to add them for you. What else can I do to help prevent falls?  Wear shoes that:  Do not have high heels.  Have rubber bottoms.  Are comfortable and fit you well.  Are closed at the toe. Do not wear sandals.  If you use a stepladder:  Make sure that it is fully opened. Do not climb a closed stepladder.  Make sure that both sides of the stepladder are locked into place.  Ask someone to hold it for you, if possible.  Clearly mark and make sure that you can see:  Any grab bars or handrails.  First and last steps.  Where the edge of each step is.  Use tools that help you move around (mobility aids) if they are needed. These include:  Canes.  Walkers.  Scooters.  Crutches.  Turn on the lights when you go into a dark area. Replace any light bulbs as soon as they burn out.  Set up your furniture so you have a clear path. Avoid moving your furniture around.  If any of your floors are uneven, fix them.  If there are any pets around you, be aware of where they are.  Review your medicines with your doctor. Some medicines can make you feel dizzy. This can increase your chance of falling. Ask your doctor what other things that you can do to help prevent falls. This information is not intended to replace advice given to you by your health care provider. Make sure you discuss any questions you have with your health care provider. Document Released: 05/04/2009 Document Revised: 12/14/2015 Document Reviewed: 08/12/2014 Elsevier Interactive Patient Education  2017 [employer].

## 2020-12-20 ENCOUNTER — Ambulatory Visit (INDEPENDENT_AMBULATORY_CARE_PROVIDER_SITE_OTHER): Payer: Medicare Other | Admitting: Family Medicine

## 2020-12-20 ENCOUNTER — Encounter: Payer: Self-pay | Admitting: Family Medicine

## 2020-12-20 ENCOUNTER — Other Ambulatory Visit: Payer: Self-pay

## 2020-12-20 VITALS — BP 128/84 | HR 96 | Resp 16 | Ht 62.0 in | Wt 133.4 lb

## 2020-12-20 DIAGNOSIS — F17200 Nicotine dependence, unspecified, uncomplicated: Secondary | ICD-10-CM

## 2020-12-20 DIAGNOSIS — R569 Unspecified convulsions: Secondary | ICD-10-CM

## 2020-12-20 DIAGNOSIS — T148XXA Other injury of unspecified body region, initial encounter: Secondary | ICD-10-CM | POA: Diagnosis not present

## 2020-12-20 DIAGNOSIS — Z1231 Encounter for screening mammogram for malignant neoplasm of breast: Secondary | ICD-10-CM | POA: Diagnosis not present

## 2020-12-20 DIAGNOSIS — H05232 Hemorrhage of left orbit: Secondary | ICD-10-CM | POA: Diagnosis not present

## 2020-12-20 DIAGNOSIS — F17210 Nicotine dependence, cigarettes, uncomplicated: Secondary | ICD-10-CM

## 2020-12-20 MED ORDER — CEPHALEXIN 500 MG PO CAPS
500.0000 mg | ORAL_CAPSULE | Freq: Two times a day (BID) | ORAL | 0 refills | Status: DC
Start: 2020-12-20 — End: 2021-01-25

## 2020-12-20 NOTE — Progress Notes (Signed)
   [name]     MRN: [id number]      DOB: Feb 08, 1981   HPI [name] is here following a fall 2 days ago on 12/18/2020 when she ha d a seizure and hit her face, has swelling , bruising and minor skin breakdown over  Left eye. Denies change in vision, eye pain or drainage from the eye  ROS Denies recent fever or chills. Denies sinus pressure, nasal congestion, ear pain or sore throat. Denies chest congestion, productive cough or wheezing. Denies chest pains, palpitations and leg swelling Denies abdominal pain, nausea, vomiting,diarrhea or constipation.   Denies dysuria, frequency, hesitancy or incontinence. Denies joint pain, swelling and limitation in mobility.  Denies uncontrolled depression, anxiety or insomnia. Denies skin break down or rash.   PE  BP 128/84   Pulse 96   Resp 16   Ht 5\' 2"  (1.575 m)   Wt 133 lb 6.4 oz (60.5 kg)   SpO2 98%   BMI 24.40 kg/m    Patient alert and oriented and in no cardiopulmonary distress.  HEENT: No facial asymmetry, EOMI,     Neck supple .Let supraorbital bruising and swelling, minor trauma to skin over left eye, with abrasion, no drainage   Chest: Clear to auscultation bilaterally.  CVS: S1, S2 no murmurs, no S3.Regular rate.  ABD: Soft non tender.   Ext: No edema  MS: Adequate ROM spine, shoulders, hips and knees.  Skin: Intact, no ulcerations or rash noted.  Psych: Good eye contact, normal affect. Memory intact not anxious or depressed appearing.  CNS: CN 2-12 intact, power,  normal throughout.no focal deficits noted.   Assessment & Plan  Abrasion Abrasion over left eye, 5 day course of keflex, and advised against skin trauma , like rubbing the eye  Periorbital hematoma of left eye S/p direct facial trauma on 12/18/2020 during a seizure , advised cool compress, should clear in 3 to 4 weeks  Seizure (HCC) Remains uncontrolled , has Neurology f/u in next 4 weeks, reports medication compliance  NICOTINE  ADDICTION Asked:confirms currently smokes cigarettes Assess: Unwilling to set a quit date, but is cutting back Advise: needs to QUIT to reduce risk of cancer, cardio and cerebrovascular disease Assist: counseled for 5 minutes and literature provided Arrange: follow up in 2 to 4 months

## 2020-12-20 NOTE — Patient Instructions (Addendum)
F/U end August, flu vaccine at visit, call if you need me sooner  Keflex is prescribed for 5 days due to abrasion over left eye,,please do not scratch skin around eye as this will increase infection risk  Please schedule mammogram at checkout   Work on cutting down cigarettes every week, aim to be smoking no more than 15 ciggs/ day when you get back in August, now you smoke 1 PPD  Thanks for choosing [facility], we consider it a privelige to serve you.

## 2020-12-22 ENCOUNTER — Ambulatory Visit: Payer: Medicare Other | Admitting: Neurology

## 2020-12-24 ENCOUNTER — Encounter: Payer: Self-pay | Admitting: Family Medicine

## 2020-12-24 DIAGNOSIS — H05232 Hemorrhage of left orbit: Secondary | ICD-10-CM | POA: Insufficient documentation

## 2020-12-24 DIAGNOSIS — T148XXA Other injury of unspecified body region, initial encounter: Secondary | ICD-10-CM | POA: Insufficient documentation

## 2020-12-24 NOTE — Assessment & Plan Note (Signed)
Asked:confirms currently smokes cigarettes °Assess: Unwilling to set a quit date, but is cutting back °Advise: needs to QUIT to reduce risk of cancer, cardio and cerebrovascular disease °Assist: counseled for 5 minutes and literature provided °Arrange: follow up in 2 to 4 months ° °

## 2020-12-24 NOTE — Assessment & Plan Note (Signed)
Abrasion over left eye, 5 day course of keflex, and advised against skin trauma , like rubbing the eye

## 2020-12-24 NOTE — Assessment & Plan Note (Signed)
Remains uncontrolled , has Neurology f/u in next 4 weeks, reports medication compliance

## 2020-12-24 NOTE — Assessment & Plan Note (Signed)
S/p direct facial trauma on 12/18/2020 during a seizure , advised cool compress, should clear in 3 to 4 weeks

## 2020-12-29 ENCOUNTER — Other Ambulatory Visit: Payer: Self-pay

## 2020-12-29 ENCOUNTER — Ambulatory Visit (HOSPITAL_COMMUNITY)
Admission: RE | Admit: 2020-12-29 | Discharge: 2020-12-29 | Disposition: A | Discharge status: Home / Self Care | Payer: Medicare Other | Source: Ambulatory Visit | Attending: Family Medicine | Admitting: Family Medicine

## 2020-12-29 DIAGNOSIS — Z1231 Encounter for screening mammogram for malignant neoplasm of breast: Secondary | ICD-10-CM | POA: Diagnosis not present

## 2021-01-05 ENCOUNTER — Other Ambulatory Visit: Payer: Self-pay | Admitting: Family Medicine

## 2021-01-19 DIAGNOSIS — S01511A Laceration without foreign body of lip, initial encounter: Secondary | ICD-10-CM | POA: Diagnosis not present

## 2021-01-19 DIAGNOSIS — R569 Unspecified convulsions: Secondary | ICD-10-CM | POA: Diagnosis not present

## 2021-01-19 DIAGNOSIS — J3489 Other specified disorders of nose and nasal sinuses: Secondary | ICD-10-CM | POA: Diagnosis not present

## 2021-01-19 DIAGNOSIS — R519 Headache, unspecified: Secondary | ICD-10-CM | POA: Diagnosis not present

## 2021-01-19 DIAGNOSIS — M47812 Spondylosis without myelopathy or radiculopathy, cervical region: Secondary | ICD-10-CM | POA: Diagnosis not present

## 2021-01-19 DIAGNOSIS — R404 Transient alteration of awareness: Secondary | ICD-10-CM | POA: Diagnosis not present

## 2021-01-19 DIAGNOSIS — S025XXA Fracture of tooth (traumatic), initial encounter for closed fracture: Secondary | ICD-10-CM | POA: Diagnosis not present

## 2021-01-19 DIAGNOSIS — S0990XA Unspecified injury of head, initial encounter: Secondary | ICD-10-CM | POA: Diagnosis not present

## 2021-01-19 DIAGNOSIS — W1839XA Other fall on same level, initial encounter: Secondary | ICD-10-CM | POA: Diagnosis not present

## 2021-01-19 DIAGNOSIS — W19XXXA Unspecified fall, initial encounter: Secondary | ICD-10-CM | POA: Diagnosis not present

## 2021-01-19 DIAGNOSIS — Z043 Encounter for examination and observation following other accident: Secondary | ICD-10-CM | POA: Diagnosis not present

## 2021-01-25 ENCOUNTER — Ambulatory Visit (INDEPENDENT_AMBULATORY_CARE_PROVIDER_SITE_OTHER): Payer: Medicare Other | Admitting: Neurology

## 2021-01-25 ENCOUNTER — Ambulatory Visit (INDEPENDENT_AMBULATORY_CARE_PROVIDER_SITE_OTHER): Payer: Medicare Other | Admitting: Family Medicine

## 2021-01-25 ENCOUNTER — Encounter: Payer: Self-pay | Admitting: Neurology

## 2021-01-25 ENCOUNTER — Other Ambulatory Visit: Payer: Self-pay

## 2021-01-25 ENCOUNTER — Encounter: Payer: Self-pay | Admitting: Family Medicine

## 2021-01-25 VITALS — BP 152/94 | HR 78 | Ht 62.0 in | Wt 132.5 lb

## 2021-01-25 VITALS — BP 142/92 | HR 76 | Temp 99.0°F | Resp 18 | Ht 62.5 in | Wt 132.0 lb

## 2021-01-25 DIAGNOSIS — S0889XA Traumatic amputation of other parts of head, initial encounter: Secondary | ICD-10-CM | POA: Insufficient documentation

## 2021-01-25 DIAGNOSIS — S0993XD Unspecified injury of face, subsequent encounter: Secondary | ICD-10-CM

## 2021-01-25 DIAGNOSIS — S0993XA Unspecified injury of face, initial encounter: Secondary | ICD-10-CM | POA: Insufficient documentation

## 2021-01-25 DIAGNOSIS — G43009 Migraine without aura, not intractable, without status migrainosus: Secondary | ICD-10-CM | POA: Diagnosis not present

## 2021-01-25 DIAGNOSIS — G40019 Localization-related (focal) (partial) idiopathic epilepsy and epileptic syndromes with seizures of localized onset, intractable, without status epilepticus: Secondary | ICD-10-CM | POA: Diagnosis not present

## 2021-01-25 HISTORY — DX: Unspecified injury of face, initial encounter: S09.93XA

## 2021-01-25 MED ORDER — XCOPRI 14 X 12.5 MG & 14 X 25 MG PO TBPK: ORAL_TABLET | ORAL | 0 refills | Status: DC

## 2021-01-25 MED ORDER — CEPHALEXIN 500 MG PO CAPS: 500.0000 mg | ORAL_CAPSULE | Freq: Three times a day (TID) | ORAL | 0 refills | Status: DC

## 2021-01-25 NOTE — Patient Instructions (Signed)
Start Xcopri (Cenobamate) starter pack 12.5mg  every night for 2 weeks, then 25mg  every night for 2 weeks. The next dose will be in the pillpacks with Exact Care  2. Continue oxcarbazepine 600mg : take 1 tablet in AM, 2 tablets in PM and Zonisamide 100mg : Take 1 cap in AM, 5 caps in PM  3. Continue nortriptyline 10mg  every night  4. Please see Dr. for your lip wound  5. Please call [facility] to schedule follow-up   6. Follow-up in 3-4 months, call for any changes   Seizure Precautions: 1. If medication has been prescribed for you to prevent seizures, take it exactly as directed.  Do not stop taking the medicine without talking to your doctor first, even if you have not had a seizure in a long time.   2. Avoid activities in which a seizure would cause danger to yourself or to others.  Don't operate dangerous machinery, swim alone, or climb in high or dangerous places, such as on ladders, roofs, or girders.  Do not drive unless your doctor says you may.  3. If you have any warning that you may have a seizure, lay down in a safe place where you can't hurt yourself.    4.  No driving for 6 months from last seizure, as per [facility].   Please refer to the following link on the Epilepsy Foundation of America's website for more information: http://www.epilepsyfoundation.org/answerplace/Social/driving/drivingu.cfm   5.  Maintain good sleep hygiene.  6.  Notify your neurology if you are planning pregnancy or if you become pregnant.  7.  Contact your doctor if you have any problems that may be related to the medicine you are taking.  8.  Call 911 and bring the patient back to the ED if:        A.  The seizure lasts longer than 5 minutes.       B.  The patient doesn't awaken shortly after the seizure  C.  The patient has new problems such as difficulty seeing, speaking or moving  D.  The patient was injured during the seizure  E.  The patient has a temperature over 102  F (39C)  F.  The patient vomited and now is having trouble breathing

## 2021-01-25 NOTE — Progress Notes (Signed)
NEUROLOGY FOLLOW UP OFFICE NOTE  [name] [id number] 02-01-1981  HISTORY OF PRESENT ILLNESS: I had the pleasure of seeing Jenisa Monty in follow-up in the neurology clinic on 01/25/2021.  The patient was last evaluated by telephone 4 months ago for intractable epilepsy. She is accompanied by her mother who helps supplement the history today. On her last visit, Zonisamide was increased to 100mg  in AM,500mg  in PM for migraine and seizure prophylaxis. She is also on oxcarbazepine 600mg  in AM, 1200mg  in PM. She is on nortriptyline 10mg  qhs for migraine prophylaxis. She has prn Nurtec and Tizanidine for migraine rescue. Since her last visit, she was in the ER on 10/09/20 for seizure where she fell down the stairs. She had a seizure at the end of May sustaining a left eye abrasion and periorbital hematoma. She was at [facility] last 01/19/21 for another seizure while [place] were [place] a casino, she fell and hit her face on the floor, sustaining a complex lower lip laceration and broken tooth. The seizure lasted less than 30 seconds. Lower lip today is discolored with yellowish discharge.   Records from [facility] reviewed: She had a PET scan in 01/2020: no seizure focus identified  Video EEG (02/17/2020-02/23/2020) : She had one GTC with R-head deviation (appeared to be L-sided onset) and another episode with grunting and unclear shaking (pt off camera) with postictal agiatation, but no staring spells with oral and hand automatisms. Interictal EEG showed an abnormal EEG due to occasional left temporoparietal and parietal sharp waves.  Neuropsychological evaluation in 12/2019: Abnormal but consistent with expectation given her overall intellectual functioning and limited educational attainment. She does have slightly better discrimination or recognition of visual versus verbal material. However, overall, there is not compelling evidence of lateralized dysfunction. Given her memory dysfunction, results suggest that  she would be at a low risk of further memory impairment should she undergo temporal resection. There are no mood or psychiatric concerns of note and she has strong family support.    Oxcarbazepine level 03/2020: 36 (ref 10-35) Zonisamide level 03/2020: 24.2 (ref 10-40)   History from Initial Assessment 11/14/2017: This is a 40 year old right-handed woman with a history of migraines, back pain, and seizures, presenting to establish care. Seizures started around 2010 when she started having nocturnal convulsions with tongue bite and urinary incontinence. She would wake up with her children telling her what happened. She would be slightly confused but back to baseline quickly. She was on Keppra and Lamictal with no change in seizure frequency. At that time she was also drinking heavily, but even with decrease in alcohol intake, had not noticed any change in frequency. She was admitted to [facility] for 7 days in October 2012, typical episodes were not captured. EMU report unavailable for review, on discharge summary it was noted there were incidental left-sided sharp waves of which there may or ma not be a correlation to her spells. Keppra stopped and Lamictal dose increased. She was seeing neurologist Dr. 20, records unavailable for review, but it appears Fycompa was added to Lamictal. She reported worsening of seizures, now occurring in wakefulness, and was admitted for another 7-day EMU stay at [facility] in January 2018, again typical events were not captured. Baseline EEG was abnormal with intermittent left anterior to midtemporal slow waves, sharply contoured bursts of theta slowing over the left temporal region, as well as intermittent right temporal theta and delta slowing. There were also frequent 3-4 Hz very brief intermediate duration  fluctuating runs of rhythmic delta activities (LRDA) maximal at F7. Occasional very brief runs were also seen maximal at F8.There were occasional independent left  temporal and right temporal sharp waves. At times left temporal discharges have a wide field. There are ER visits in April, May, October, November 2018 for seizures, medications listed included Vimpat, Fycompa, and Oxtellar. Her last ER visit was on 11/05/17, taking Oxtellar 1200mg  BID without side effects. She reports compliance to medication.   She brings a calendar of her seizures in 2019. She had 2 in January, 2 in February, 1 in March, 3 so far in April. The last seizure occurred on 11/11/17 out of sleep, she woke up with a lip bite. She has had a lot of her seizures occur while playing at the [facility]. Family has witnessed the seizures, describing staring/unresponsiveness with lip smacking, then convulsions with head turn to the right. She denies any prior warning symptoms, no post-ictal focal weakness. Her entire body feels sore. No clear seizure triggers, she denies any sleep deprivation or missed medications. She has significantly cut down on alcohol, drinking 1 beer on the weekend.    She denies any olfactory/gustatory hallucinations, deja vu, rising epigastric sensation, focal numbness/tingling/weakness, myoclonic jerks. She has migraines after a seizure and had a prescription for Norco. She also has Zipsor and Robaxin to take after seizures. She denies any dizziness, diplopia, dysarthria/dysphagia, neck/back pain, bowel/bladder dysfunction.    Epilepsy Risk Factors:  Her brother had seizures in childhood. She had a normal birth and early development.  There is no history of febrile convulsions, CNS infections such as meningitis/encephalitis, significant traumatic brain injury, neurosurgical procedures, or family history of seizures. She has a scar over the left temporal region after hitting her head on a window, she denies any loss of consciousness with this.    Prior AEDs: Lamictal, Keppra, Fycompa, Vimpat, Depakote Prior migraine rescue medications: sumatriptan, rizatriptan, naratriptan,  Migranal nasal spray, Ubrelvy, Fioricet, Robaxin, flexeril, ibuprofen, naproxen, diclofenac.   Laboratory Data:  EEGs: Her EEG at [facility] had shown bilateral temporal slowing, as well as rhythmic delta at F7 and F8, independent left and right temporal sharp waves. MRI: MRI brain with and without contrast in 10/2008 was unremarkable with scatted white matter changes I personally reviewed MRI brain with and without contrast done 11/17/17 which did not show any acute changes, hippocampi symmetric with no abnormal signal or enhancement seen. There were several white matter changes bilaterally with no abnormal enhancement.  PAST MEDICAL HISTORY: Past Medical History:  Diagnosis Date   Back pain    Chronic headaches    Depression    Genital warts    herpes   Herpes    Seizures (HCC) 03/15/2016    MEDICATIONS: Current Outpatient Medications on File Prior to Visit  Medication Sig Dispense Refill   famciclovir (FAMVIR) 250 MG tablet TAKE 1 TABLET(250 MG) BY MOUTH TWICE DAILY 60 tablet 3   medroxyPROGESTERone (DEPO-PROVERA) 150 MG/ML injection Inject 28mL intramuscularly every 3 months, due for appt 1 mL 3   nortriptyline (PAMELOR) 10 MG capsule Take 1 tablet every night 90 capsule 3   oxcarbazepine (TRILEPTAL) 600 MG tablet Take 1 tablet by mouth every morning & 2 in the evening 270 tablet 3   Rimegepant Sulfate (NURTEC) 75 MG TBDP Take 1 tablet by mouth as needed. Take 1 tablet as needed for migraine. Do not take more than 1 dose in 24 hours. 10 tablet 11   tiZANidine (ZANAFLEX) 4 MG tablet Take 1  tablet by mouth every 6 hours as needed for muscle spasms. 30 tablet 11   zonisamide (ZONEGRAN) 100 MG capsule Take 1 cap in AM, 5 caps in PM 540 capsule 3   cephALEXin (KEFLEX) 500 MG capsule Take 1 capsule (500 mg total) by mouth 2 (two) times daily. (Patient not taking: Reported on 01/25/2021) 10 capsule 0   ergocalciferol (VITAMIN D2) 1.25 MG (50000 UT) capsule Take 1 capsule (50,000 Units total) by  mouth once a week. One capsule once weekly (Patient not taking: Reported on 01/25/2021) 12 capsule 1   nicotine (NICODERM CQ) 21 mg/24hr patch Place 1 patch (21 mg total) onto the skin daily. (Patient not taking: No sig reported) 28 patch 1   No current facility-administered medications on file prior to visit.    ALLERGIES: Allergies  Allergen Reactions   Morphine Other (See Comments)    Unknown Reaction  unknown    FAMILY HISTORY: Family History  Problem Relation Age of Onset   Hypertension Mother    Healthy Father     SOCIAL HISTORY: Social History   Socioeconomic History   Marital status: Single    Spouse name: Not on file   Number of children: 2   Years of education: 48, GED   Highest education level: Not on file  Occupational History    Comment: na  Tobacco Use   Smoking status: Every Day    Packs/day: 1.00    Years: 10.00    Pack years: 10.00    Types: Cigarettes   Smokeless tobacco: Never  Vaping Use   Vaping Use: Never used  Substance and Sexual Activity   Alcohol use: Not Currently    Alcohol/week: 2.0 standard drinks    Types: 2 Cans of beer per week    Comment: last drink in December   Drug use: Yes    Types: Marijuana    Comment: everyday   Sexual activity: Not Currently    Birth control/protection: Injection  Other Topics Concern   Not on file  Social History Narrative   Lives  With 1 child in home      Caffeine -Pepsi 2 daily       Right handed      Highest level of edu- 11 grade      Disabled   Social Determinants of Health   Financial Resource Strain: Low Risk    Difficulty of Paying Living Expenses: Not hard at all  Food Insecurity: No Food Insecurity   Worried About Programme researcher, broadcasting/film/video in the Last Year: Never true   Ran Out of Food in the Last Year: Never true  Transportation Needs: No Transportation Needs   Lack of Transportation (Medical): No   Lack of Transportation (Non-Medical): No  Physical Activity: Inactive   Days of  Exercise per Week: 0 days   Minutes of Exercise per Session: 0 min  Stress: No Stress Concern Present   Feeling of Stress : Not at all  Social Connections: Socially Isolated   Frequency of Communication with Friends and Family: More than three times a week   Frequency of Social Gatherings with Friends and Family: More than three times a week   Attends Religious Services: Never   Database administrator or Organizations: No   Attends Banker Meetings: Never   Marital Status: Never married  Catering manager Violence: Not At Risk   Fear of Current or Ex-Partner: No   Emotionally Abused: No   Physically Abused: No  Sexually Abused: No     PHYSICAL EXAM: Vitals:   01/25/21 1143  BP: (!) 152/94  Pulse: 78  SpO2: 100%   General: No acute distress Head:  Normocephalic/atraumatic. Lower lip discolored with yellowish discharge Skin/Extremities: No rash, no edema Neurological Exam: alert and awake. No aphasia or dysarthria. Fund of knowledge is reduced.  Recent and remote memory are impaired.  Attention and concentration are normal.   Cranial nerves: Pupils equal, round. Extraocular movements intact with no nystagmus. Visual fields full.  No facial asymmetry.  Motor: Bulk and tone normal, muscle strength 5/5 throughout with no pronator drift.   Finger to nose testing intact.  Gait narrow-based and steady, no ataxia   IMPRESSION: This is a 40 yo RH woman with a history of migraines and intractable epilepsy. She continues to have seizures with significant injuries, most recently on 01/19/21 sustaining lower lip injury. Semiology suggestive of focal to bilateral tonic-clonic epilepsy arising from the left temporal region (staring/unresponsive with lip smacking followed by convulsion with head version to the right per family). [facility] with vEEG capturing seizures that appeared to be left-sided onset, interictal EEG with left temporoparietal and  parietal sharp waves, PET scan no seizure focus identified. Further evaluation has been recommended with sEEG, [facility] and was advised to call them for follow-up. In the meantime, we discussed adding on Cenobamate, side effects discussed. Continue oxcarbazepine 600mg  in AM, 1200mg  in PM and Zonisamide 100mg  in AM, 500mg  in PM. She is also on nortriptyline 10mg  qhs for headache prophylaxis. Lower lip injury today appears infected, advised to follow-up with PCP. She does not drive. Follow-up in 3-4 months, call for any changes.   Thank you for allowing me to participate in her care.  Please do not hesitate to call for any questions or concerns.    Patrcia DollyKaren Tim Corriher, M.D.   CC: Dr. Lodema HongSimpson

## 2021-01-25 NOTE — Patient Instructions (Signed)
F/U in 2 weeks , re evaluate lip and blood pressure, call if you need me sooner  VERY IMPORTANT you take the antibiotic keflex one three times daily as prescribed  Please contact your Neurologist so that you have better seizure control

## 2021-01-25 NOTE — Progress Notes (Signed)
   [name]     MRN: [id number]      DOB: 1981/06/20   HPI [name] is here with a h/o trauma to lower lip while having a seizure on July 1, false tooth went through  her lip and has fallen out, hwe has no recall of taking any antibiotics since the injury. Fell while having a seizure Lower lip where injured is discolored and has an odor   ROS Denies recent fever or chills. Denies sinus pressure, nasal congestion, ear pain or sore throat. Denies chest congestion, productive cough or wheezing. Denies chest pains, palpitations and leg swelling Denies abdominal pain, nausea, vomiting,diarrhea or constipation.   Denies dysuria, frequency, hesitancy or incontinence.  PE  BP (!) 152/103 (BP Location: Right Arm, Patient Position: Sitting, Cuff Size: Normal)   Pulse 76   Temp 99 F (37.2 C)   Resp 18   Ht 5' 2.5" (1.588 m)   Wt 132 lb (59.9 kg)   SpO2 98%   BMI 23.76 kg/m   Patient alert seems distressed, and is easily tearful HEENT: No facial asymmetry, EOMI,     Neck supple .Infected lower lip, may  need plastic surgery to repair, has apt at [facility] ED on 01/29/2021 to remove suture  Chest: Clear to auscultation bilaterally.  CVS: S1, S2 no murmurs, no S3.Regular rate.  ABD: Soft non tender.   Ext: No edema  .  CNS: CN 2-12 intact, power,  normal throughout.no focal deficits noted.   Assessment & Plan  Lip injury Antibiotic prescribed, she will return to [facility] as planned on 7/11/202, at that time will be better able to assess healing  Oral hygiene discussed with [name]  Traumatic amputation of lip Will be re evaluted in ED  Where sutures will be removed, she may need Plastic surgery eval Keflex precribed and she is advised to take all the antibiotic as prescribed

## 2021-01-28 ENCOUNTER — Encounter: Payer: Self-pay | Admitting: Family Medicine

## 2021-01-28 NOTE — Assessment & Plan Note (Addendum)
Antibiotic prescribed, she will return to UC as planned on 7/11/202, at that time will be better able to assess healing  Oral hygiene discussed with Nicole Cella

## 2021-01-28 NOTE — Assessment & Plan Note (Signed)
Will be re evaluted in ED  Where sutures will be removed, she may need Plastic surgery eval Keflex precribed and she is advised to take all the antibiotic as prescribed

## 2021-01-29 ENCOUNTER — Telehealth: Payer: Self-pay | Admitting: Neurology

## 2021-01-29 DIAGNOSIS — S01511D Laceration without foreign body of lip, subsequent encounter: Secondary | ICD-10-CM | POA: Diagnosis not present

## 2021-01-29 DIAGNOSIS — X58XXXD Exposure to other specified factors, subsequent encounter: Secondary | ICD-10-CM | POA: Diagnosis not present

## 2021-01-29 NOTE — Telephone Encounter (Signed)
I'm not quite sure how medication would cause pain in one arm, she can stop it and if no change in pain, it is not the medication and needs to see her PCP or ER. Thanks

## 2021-01-29 NOTE — Telephone Encounter (Signed)
No known injury,please advise.

## 2021-01-29 NOTE — Telephone Encounter (Signed)
Pt called in, stated since taking the new medicine, she has been having severe pain in her right arm. Xcopri. [phone]. Would like a call back.

## 2021-01-29 NOTE — Telephone Encounter (Signed)
Patient advised.

## 2021-02-05 ENCOUNTER — Other Ambulatory Visit: Payer: Self-pay | Admitting: Adult Health

## 2021-02-06 ENCOUNTER — Other Ambulatory Visit: Payer: Self-pay

## 2021-02-06 ENCOUNTER — Ambulatory Visit (INDEPENDENT_AMBULATORY_CARE_PROVIDER_SITE_OTHER): Payer: Medicare Other | Admitting: *Deleted

## 2021-02-06 DIAGNOSIS — Z3042 Encounter for surveillance of injectable contraceptive: Secondary | ICD-10-CM

## 2021-02-06 MED ORDER — MEDROXYPROGESTERONE ACETATE 150 MG/ML IM SUSP
150.0000 mg | Freq: Once | INTRAMUSCULAR | Status: AC
  Administered 2021-02-06: 150 mg via INTRAMUSCULAR

## 2021-02-06 NOTE — Progress Notes (Signed)
   NURSE VISIT- INJECTION  SUBJECTIVE:  [name] is a 40 y.o. G3P3 female here for a Depo Provera for contraception/period management. She is a GYN patient.   OBJECTIVE:  There were no vitals taken for this visit.  Appears well, in no apparent distress  Injection administered in: Right deltoid  Meds ordered this encounter  Medications   medroxyPROGESTERone (DEPO-PROVERA) injection 150 mg    ASSESSMENT: GYN patient Depo Provera for contraception/period management PLAN: Follow-up: in 11-13 weeks for next Depo   Annamarie Dawley  02/06/2021 2:30 PM

## 2021-02-12 ENCOUNTER — Ambulatory Visit (INDEPENDENT_AMBULATORY_CARE_PROVIDER_SITE_OTHER): Payer: Medicare Other | Admitting: Family Medicine

## 2021-02-12 ENCOUNTER — Encounter: Payer: Self-pay | Admitting: Family Medicine

## 2021-02-12 ENCOUNTER — Other Ambulatory Visit: Payer: Self-pay

## 2021-02-12 VITALS — BP 118/84 | HR 74 | Resp 16 | Ht 62.0 in | Wt 132.0 lb

## 2021-02-12 DIAGNOSIS — Z23 Encounter for immunization: Secondary | ICD-10-CM | POA: Diagnosis not present

## 2021-02-12 DIAGNOSIS — F17210 Nicotine dependence, cigarettes, uncomplicated: Secondary | ICD-10-CM | POA: Diagnosis not present

## 2021-02-12 DIAGNOSIS — F17200 Nicotine dependence, unspecified, uncomplicated: Secondary | ICD-10-CM

## 2021-02-12 DIAGNOSIS — R569 Unspecified convulsions: Secondary | ICD-10-CM | POA: Diagnosis not present

## 2021-02-12 DIAGNOSIS — S0993XD Unspecified injury of face, subsequent encounter: Secondary | ICD-10-CM | POA: Diagnosis not present

## 2021-02-12 MED ORDER — FAMCICLOVIR 250 MG PO TABS: ORAL_TABLET | ORAL | 3 refills | Status: DC

## 2021-02-12 NOTE — Patient Instructions (Signed)
Annual exam in office with MD October 8 or after.  Please cancel August appointment with MD.  Patient to call and come into the office for flu vaccine in August.  Pneumonia 23 vaccine today in the office.  Need to work on quitting smoking and you report smoking half a pack per day and knowing that you need to quit by September.  Lip laceration has healed well.   Thanks for choosing [facility], we consider it a privelige to serve you.

## 2021-02-19 ENCOUNTER — Encounter: Payer: Self-pay | Admitting: Family Medicine

## 2021-02-19 NOTE — Assessment & Plan Note (Signed)
Asked:confirms currently smokes cigarettes °Assess: Unwilling to set a quit date, but is cutting back °Advise: needs to QUIT to reduce risk of cancer, cardio and cerebrovascular disease °Assist: counseled for 5 minutes and literature provided °Arrange: follow up in 2 to 4 months ° °

## 2021-02-19 NOTE — Assessment & Plan Note (Signed)
Fully healed with good closure, no need for plastic referral

## 2021-02-19 NOTE — Assessment & Plan Note (Signed)
Uncontrolled with recurrent breakthrough seizures, has planned hoispitalization in September for better control

## 2021-02-19 NOTE — Progress Notes (Signed)
   [name]     MRN: [id number]      DOB: 1981/02/26   HPI [name] is here for follow up of lacerated lip, this has healed very well ., and she will not need a referral to Plastics. No seizures since last visit Trying to quit smoking, will be hospitalized at [facility] in September for seizure control and knows she needs to quit by then ROS Denies recent fever or chills. Denies sinus pressure, nasal congestion, ear pain or sore throat. Denies chest congestion, productive cough or wheezing Denies chest pains, palpitations and leg swelling Denies abdominal pain, nausea, vomiting,diarrhea or constipation.   Denies dysuria, frequency, hesitancy or incontinence. Denies joint pain, swelling and limitation in mobility.  Denies skin break down or rash.   PE  BP 118/84   Pulse 74   Resp 16   Ht 5\' 2"  (1.575 m)   Wt 132 lb (59.9 kg)   SpO2 98%   BMI 24.14 kg/m   Patient alert and oriented and in no cardiopulmonary distress.  HEENT: No facial asymmetry, EOMI,     Neck supple .Lip laceration well healed with sign of bacterial infection  Chest: Clear to auscultation bilaterally.  CVS: S1, S2 no murmurs, no S3.Regular rate.  ABD: Soft non tender.   Ext: No edema  MS: Adequate ROM spine, shoulders, hips and knees.  Skin: Intact, no ulcerations or rash noted.  Psych: Good eye contact, normal affect. Memory intact not anxious or depressed appearing.  CNS: CN 2-12 intact, power,  normal throughout.no focal deficits noted.   Assessment & Plan  Lip injury Fully healed with good closure, no need for plastic referral  NICOTINE ADDICTION Asked:confirms currently smokes cigarettes Assess: Unwilling to set a quit date, but is cutting back Advise: needs to QUIT to reduce risk of cancer, cardio and cerebrovascular disease Assist: counseled for 5 minutes and literature provided Arrange: follow up in 2 to 4 months   Convulsions (HCC) Uncontrolled with recurrent breakthrough  seizures, has planned hoispitalization in September for better control

## 2021-03-03 ENCOUNTER — Emergency Department (HOSPITAL_COMMUNITY)
Admission: EM | Admit: 2021-03-03 | Discharge: 2021-03-03 | Disposition: A | Discharge status: Home / Self Care | Payer: Medicare Other | Attending: Emergency Medicine | Admitting: Emergency Medicine

## 2021-03-03 DIAGNOSIS — G40909 Epilepsy, unspecified, not intractable, without status epilepticus: Secondary | ICD-10-CM | POA: Diagnosis present

## 2021-03-03 DIAGNOSIS — Z79899 Other long term (current) drug therapy: Secondary | ICD-10-CM | POA: Diagnosis not present

## 2021-03-03 DIAGNOSIS — R404 Transient alteration of awareness: Secondary | ICD-10-CM | POA: Diagnosis not present

## 2021-03-03 DIAGNOSIS — F17210 Nicotine dependence, cigarettes, uncomplicated: Secondary | ICD-10-CM | POA: Insufficient documentation

## 2021-03-03 DIAGNOSIS — R569 Unspecified convulsions: Secondary | ICD-10-CM

## 2021-03-03 DIAGNOSIS — R1111 Vomiting without nausea: Secondary | ICD-10-CM | POA: Diagnosis not present

## 2021-03-03 DIAGNOSIS — Z20822 Contact with and (suspected) exposure to covid-19: Secondary | ICD-10-CM | POA: Diagnosis not present

## 2021-03-03 LAB — CBG MONITORING, ED: Glucose-Capillary: 137 mg/dL — ABNORMAL HIGH (ref 70–99)

## 2021-03-03 MED ORDER — LORAZEPAM 1 MG PO TABS
1.0000 mg | ORAL_TABLET | Freq: Once | ORAL | Status: AC
  Administered 2021-03-03: 1 mg via ORAL
  Filled 2021-03-03: qty 1

## 2021-03-03 NOTE — ED Provider Notes (Signed)
[facility] EMERGENCY DEPARTMENT Provider Note   CSN: [id number] Arrival date & time: 03/03/21  1422     History Chief Complaint  Patient presents with   Seizures    [name] is a 40 y.o. female.   Seizures  This patient is a 40 year old female, she has a history of known epileptiform disorder, she has a history of following with neurology in the past, she is on Zonegran, Trileptal, she also takes a tricyclic antidepressant at night to help with sleep and to prevent seizures at night when they often happen.  The last notes from her neurology clinic was from March of this year.  She presents to the hospital after having a witnessed seizure.  The patient is the granddaughter of one of the patients in the emergency department who died earlier in the day.  She unfortunately had a cardiac arrest, the patient was very upset, she had been drinking a little bit alcohol, she was then witnessed to have the seizure which self resolved.  By the time the paramedics arrived the patient had been ambulating minimally, she was able to help get to the stretcher, she had no evidence of tongue biting or urinary incontinence, she was confused but gradually improving.  At this time the patient is still slightly confused, level 5 caveat applies.  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   Lip injury 01/25/2021   Abrasion 12/24/2020   Post traumatic stress disorder (PTSD) 04/28/2020   Post-traumatic stress 04/27/2020   Depression, major, single episode, severe (HCC) 04/27/2020   PCR DNA positive for HSV2 07/25/2018   Localization-related (focal) (partial) idiopathic epilepsy and epileptic syndromes with seizures of localized onset, intractable, without  status epilepticus (HCC) 11/17/2017   Seizure (HCC) 11/19/2015   Amenorrhea 09/28/2011   Vitamin D deficiency 09/06/2011   Headache 03/10/2011   Obesity (BMI 30.0-34.9) 05/28/2009   Convulsions (HCC) 10/26/2008   NICOTINE ADDICTION 12/10/2007   Depression 12/10/2007    Past Surgical History:  Procedure Laterality Date   CESAREAN SECTION     x 3     OB History     Gravida  3   Para  3   Term      Preterm      AB      Living  3      SAB      IAB      Ectopic      Multiple      Live Births  3           Family History  Problem Relation Age of Onset   Hypertension Mother    Healthy Father     Social History   Tobacco Use   Smoking status: Every Day    Packs/day: 1.00    Years: 10.00    Pack years: 10.00    Types: Cigarettes   Smokeless tobacco: Never  Vaping Use   Vaping Use: Never used  Substance Use Topics   Alcohol use: Not Currently    Alcohol/week: 2.0 standard drinks    Types: 2 Cans of beer per week    Comment: last drink in December   Drug  use: Yes    Types: Marijuana    Comment: everyday    Home Medications Prior to Admission medications   Medication Sig Start Date End Date Taking? Authorizing Provider  Cenobamate (XCOPRI) 14 x 12.5 MG & 14 x 25 MG TBPK Take 12.5mg  tablet every night for 2 weeks, then 25mg  tablet every night for 2 weeks 01/25/21   03/28/21, MD  ergocalciferol (VITAMIN D2) 1.25 MG (50000 UT) capsule Take 1 capsule (50,000 Units total) by mouth once a week. One capsule once weekly 11/02/20   11/04/20, MD  famciclovir [facility]) 250 MG tablet TAKE 1 TABLET(250 MG) BY MOUTH TWICE DAILY 02/12/21   02/14/21, MD  medroxyPROGESTERone (DEPO-PROVERA) 150 MG/ML injection ADMINISTER 1 ML IN THE MUSCLE 1 TIME EVERY 3 MONTHS AS DIRECTED 02/05/21   02/07/21, CNM  nortriptyline (PAMELOR) 10 MG capsule Take 1 tablet every night 10/04/20   10/06/20, MD  oxcarbazepine (TRILEPTAL) 600 MG tablet  Take 1 tablet by mouth every morning & 2 in the evening 10/04/20   10/06/20, MD  Rimegepant Sulfate (NURTEC) 75 MG TBDP Take 1 tablet by mouth as needed. Take 1 tablet as needed for migraine. Do not take more than 1 dose in 24 hours. 10/04/20   10/06/20, MD  tiZANidine (ZANAFLEX) 4 MG tablet Take 1 tablet by mouth every 6 hours as needed for muscle spasms. 10/04/20   10/06/20, MD  zonisamide [facility]) 100 MG capsule Take 1 cap in AM, 5 caps in PM 10/04/20   10/06/20, MD    Allergies    Morphine  Review of Systems   Review of Systems  Unable to perform ROS: Mental status change  Neurological:  Positive for seizures.   Physical Exam Updated Vital Signs BP (!) 142/96   Pulse 71   Temp (!) 97.5 F (36.4 C) (Oral)   Resp 11   Ht 1.575 m (5\' 2" )   Wt 63.7 kg   SpO2 98%   BMI 25.69 kg/m   Physical Exam Vitals and nursing note reviewed.  Constitutional:      General: She is not in acute distress.    Appearance: She is well-developed.  HENT:     Head: Normocephalic and atraumatic.     Mouth/Throat:     Pharynx: No oropharyngeal exudate.     Comments: There is no evidence of damage or injury to the tongue Eyes:     General: No scleral icterus.       Right eye: No discharge.        Left eye: No discharge.     Conjunctiva/sclera: Conjunctivae normal.     Pupils: Pupils are equal, round, and reactive to light.  Neck:     Thyroid: No thyromegaly.     Vascular: No JVD.  Cardiovascular:     Rate and Rhythm: Normal rate and regular rhythm.     Heart sounds: Normal heart sounds. No murmur heard.   No friction rub. No gallop.  Pulmonary:     Effort: Pulmonary effort is normal. No respiratory distress.     Breath sounds: Normal breath sounds. No wheezing or rales.  Abdominal:     General: Bowel sounds are normal. There is no distension.     Palpations: Abdomen is soft. There is no mass.     Tenderness: There is no abdominal tenderness.  Musculoskeletal:         General: No tenderness.  Normal range of motion.     Cervical back: Normal range of motion and neck supple.  Lymphadenopathy:     Cervical: No cervical adenopathy.  Skin:    General: Skin is warm and dry.     Findings: No erythema or rash.  Neurological:     Mental Status: She is alert.     Coordination: Coordination normal.     Comments: The patient is able to move all 4 extremities, she follows commands with normal strength, normal sensation, cranial nerves III through XII are normal as well.  Her answers to questions, little bit slower but she is able to answer my questions  Psychiatric:        Behavior: Behavior normal.    ED Results / Procedures / Treatments   Labs (all labs ordered are listed, but only abnormal results are displayed) Labs Reviewed  CBG MONITORING, ED - Abnormal; Notable for the following components:      Result Value   Glucose-Capillary 137 (*)    All other components within normal limits    EKG EKG Interpretation  Date/Time:  Saturday March 03 2021 14:32:36 EDT Ventricular Rate:  77 PR Interval:  166 QRS Duration: 101 QT Interval:  487 QTC Calculation: 552 R Axis:   72 Text Interpretation: Sinus rhythm Borderline T abnormalities, anterior leads Prolonged QT interval since last tracing no significant change Confirmed by Eber Hong ([id number]) on 03/03/2021 3:05:34 PM  Radiology No results found.  Procedures Procedures   Medications Ordered in ED Medications  LORazepam (ATIVAN) tablet 1 mg (1 mg Oral Given 03/03/21 1540)    ED Course  I have reviewed the triage vital signs and the nursing notes.  Pertinent labs & imaging results that were available during my care of the patient were reviewed by me and considered in my medical decision making (see chart for details).    MDM Rules/Calculators/A&P                           Blood pressure is normalized, heart rate is normal, she is afebrile, she does not have any signs of trauma, she has  likely had another seizure but is on multiple medications at this time with a significant stressor with the loss of her grandmother earlier today, she does not need CT scan imaging nor does she need lab work, she will get 1 mg of Ativan and she will rest and be observed in the emergency department for further seizures.  During the patient's emergency department stay she had no further seizures, she is awake and alert and back to her normal self.  I do not think there is a need to change her medications around at this time as there is a significant event over the course of the day that has caused her to be not herself.  She has no focal neurologic findings no headache no fever no chills no nausea no vomiting no tachycardia no hypotension and can follow-up in the outpatient setting, the patient was totally agreeable to this plan and is stable for discharge  Final Clinical Impression(s) / ED Diagnoses Final diagnoses:  Seizure [facility])     Eber Hong, MD 03/03/21 1759

## 2021-03-03 NOTE — ED Triage Notes (Signed)
Pt. Arrived via EMS with complaints of seizure. Per EMS Pt. Was sitting outside with family when they began to have a seizure. EMS states they were talking on scene. Pt. Is hard to arouse now but will respond if you walk them up. Pt. Given 4 of zofran in route. Pt. Did ambulate to gurney with assistance.

## 2021-03-03 NOTE — Discharge Instructions (Addendum)
Please follow-up with your neurologist and your family doctor this week.  Take all of your medications exactly as prescribed  Return to the emergency department for severe or worsening symptoms  Thank you for letting us take care of you today!  Please obtain all of your results from medical records or have your doctors office obtain the results - share them with your doctor - you should be seen at your doctors office in the next 2 days. Call today to arrange your follow up. Take the medications as prescribed. Please review all of the medicines and only take them if you do not have an allergy to them. Please be aware that if you are taking birth control pills, taking other prescriptions, ESPECIALLY ANTIBIOTICS may make the birth control ineffective - if this is the case, either do not engage in sexual activity or use alternative methods of birth control such as condoms until you have finished the medicine and your family doctor says it is OK to restart them. If you are on a blood thinner such as COUMADIN, be aware that any other medicine that you take may cause the coumadin to either work too much, or not enough - you should have your coumadin level rechecked in next 7 days if this is the case.  ?  It is also a possibility that you have an allergic reaction to any of the medicines that you have been prescribed - Everybody reacts differently to medications and while MOST people have no trouble with most medicines, you may have a reaction such as nausea, vomiting, rash, swelling, shortness of breath. If this is the case, please stop taking the medicine immediately and contact your physician.   If you were given a medication in the ED such as percocet, vicodin, or morphine, be aware that these medicines are sedating and may change your ability to take care of yourself adequately for several hours after being given this medicines - you should not drive or take care of small children if you were given this  medicine in the Emergency Department or if you have been prescribed these types of medicines. ?   You should return to the ER IMMEDIATELY if you develop severe or worsening symptoms.

## 2021-03-03 NOTE — ED Notes (Signed)
Seizure pads applied to bed.  

## 2021-03-07 ENCOUNTER — Telehealth (INDEPENDENT_AMBULATORY_CARE_PROVIDER_SITE_OTHER): Payer: Medicare Other | Admitting: Nurse Practitioner

## 2021-03-07 ENCOUNTER — Encounter: Payer: Self-pay | Admitting: Nurse Practitioner

## 2021-03-07 ENCOUNTER — Other Ambulatory Visit: Payer: Self-pay

## 2021-03-07 DIAGNOSIS — W19XXXD Unspecified fall, subsequent encounter: Secondary | ICD-10-CM | POA: Diagnosis not present

## 2021-03-07 DIAGNOSIS — M79672 Pain in left foot: Secondary | ICD-10-CM | POA: Diagnosis not present

## 2021-03-07 DIAGNOSIS — W19XXXA Unspecified fall, initial encounter: Secondary | ICD-10-CM | POA: Insufficient documentation

## 2021-03-07 HISTORY — DX: Unspecified fall, initial encounter: W19.XXXA

## 2021-03-07 MED ORDER — IBUPROFEN 800 MG PO TABS: 800.0000 mg | ORAL_TABLET | Freq: Three times a day (TID) | ORAL | 0 refills | Status: DC | PRN

## 2021-03-07 NOTE — Assessment & Plan Note (Signed)
-  had seizure on 8/13 and fell; has left foot pain as result of the fall

## 2021-03-07 NOTE — Assessment & Plan Note (Signed)
-  Rx. Ibuprofen -she states she is unable to walk -referral to ortho

## 2021-03-07 NOTE — Progress Notes (Signed)
Acute Office Visit  Subjective:    Patient ID: [name], female    DOB: 09/23/1980, 40 y.o.   MRN: [id number]  Chief Complaint  Patient presents with   Foot Injury    Had a seizure and fell on 8/13 and went to the ER. Once coming home her L foot started bothering her and now she can't walk at all due to the pain. The pain shoots up her leg.    Foot Injury   Patient is in today for left foot pain that radiates into her lower leg, but not to the level of her knee. She rates her pain at 10/10, and she states that she is unable to walk.  She fell on 8/13 and noticed foot pain after she got home form the ED.  Past Medical History:  Diagnosis Date   Back pain    Chronic headaches    Depression    Genital warts    herpes   Herpes    Seizures ([place]) 03/15/2016    Past Surgical History:  Procedure Laterality Date   CESAREAN SECTION     x 3    Family History  Problem Relation Age of Onset   Hypertension Mother    Healthy Father     Social History   Socioeconomic History   Marital status: Single    Spouse name: Not on file   Number of children: 2   Years of education: 62, GED   Highest education level: Not on file  Occupational History    Comment: na  Tobacco Use   Smoking status: Every Day    Packs/day: 1.00    Years: 10.00    Pack years: 10.00    Types: Cigarettes   Smokeless tobacco: Never  Vaping Use   Vaping Use: Never used  Substance and Sexual Activity   Alcohol use: Not Currently    Alcohol/week: 2.0 standard drinks    Types: 2 Cans of beer per week    Comment: last drink in December   Drug use: Yes    Types: Marijuana    Comment: everyday   Sexual activity: Not Currently    Birth control/protection: Injection  Other Topics Concern   Not on file  Social History Narrative   Lives  With 1 child in home      Caffeine -Pepsi 2 daily       Right handed      Highest level of edu- 11 grade      Disabled   Social Determinants of Health    Financial Resource Strain: Low Risk    Difficulty of Paying Living Expenses: Not hard at all  Food Insecurity: No Food Insecurity   Worried About Charity fundraiser in the Last Year: Never true   [place] in the Last Year: Never true  Transportation Needs: No Transportation Needs   Lack of Transportation (Medical): No   Lack of Transportation (Non-Medical): No  Physical Activity: Inactive   Days of Exercise per Week: 0 days   Minutes of Exercise per Session: 0 min  Stress: No Stress Concern Present   Feeling of Stress : Not at all  Social Connections: Socially Isolated   Frequency of Communication with Friends and Family: More than three times a week   Frequency of Social Gatherings with Friends and Family: More than three times a week   Attends Religious Services: Never   Marine scientist or Organizations: No   Attends  Club or Organization Meetings: Never   Marital Status: Never married  Human resources officer Violence: Not At Risk   Fear of Current or Ex-Partner: No   Emotionally Abused: No   Physically Abused: No   Sexually Abused: No    Outpatient Medications Prior to Visit  Medication Sig Dispense Refill   Cenobamate (XCOPRI) 14 x 12.5 MG & 14 x 25 MG TBPK Take 12.57m tablet every night for 2 weeks, then 222mtablet every night for 2 weeks 28 each 0   ergocalciferol (VITAMIN D2) 1.25 MG (50000 UT) capsule Take 1 capsule (50,000 Units total) by mouth once a week. One capsule once weekly 12 capsule 1   famciclovir (FAMVIR) 250 MG tablet TAKE 1 TABLET(250 MG) BY MOUTH TWICE DAILY 60 tablet 3   medroxyPROGESTERone (DEPO-PROVERA) 150 MG/ML injection ADMINISTER 1 ML IN THE MUSCLE 1 TIME EVERY 3 MONTHS AS DIRECTED 1 mL 0   nortriptyline (PAMELOR) 10 MG capsule Take 1 tablet every night 90 capsule 3   oxcarbazepine (TRILEPTAL) 600 MG tablet Take 1 tablet by mouth every morning & 2 in the evening 270 tablet 3   Rimegepant Sulfate (NURTEC) 75 MG TBDP Take 1 tablet by mouth  as needed. Take 1 tablet as needed for migraine. Do not take more than 1 dose in 24 hours. 10 tablet 11   tiZANidine (ZANAFLEX) 4 MG tablet Take 1 tablet by mouth every 6 hours as needed for muscle spasms. 30 tablet 11   zonisamide (ZONEGRAN) 100 MG capsule Take 1 cap in AM, 5 caps in PM 540 capsule 3   ketorolac (TORADOL) 10 MG tablet Take 10 mg by mouth every 8 (eight) hours as needed.     No facility-administered medications prior to visit.    Allergies  Allergen Reactions   Morphine Other (See Comments)    Unknown Reaction  unknown    Review of Systems  Constitutional: Negative.   Musculoskeletal:        Left foot pain per HPI      Objective:    Physical Exam  There were no vitals taken for this visit. Wt Readings from Last 3 Encounters:  03/03/21 140 lb 6.9 oz (63.7 kg)  02/12/21 132 lb (59.9 kg)  01/25/21 132 lb (59.9 kg)    Health Maintenance Due  Topic Date Due   COVID-19 Vaccine (3 - Moderna risk series) 09/28/2020   INFLUENZA VACCINE  02/19/2021    There are no preventive care reminders to display for this patient.   Lab Results  Component Value Date   TSH 0.774 11/01/2020   Lab Results  Component Value Date   WBC 4.6 05/26/2020   HGB 13.1 05/26/2020   HCT 39.2 05/26/2020   MCV 100.0 05/26/2020   PLT 388 05/26/2020   Lab Results  Component Value Date   NA 136 11/01/2020   K 4.1 11/01/2020   CO2 17 (L) 11/01/2020   GLUCOSE 95 11/01/2020   BUN 6 11/01/2020   CREATININE 0.72 11/01/2020   BILITOT <0.2 11/01/2020   ALKPHOS 103 11/01/2020   AST 17 11/01/2020   ALT 11 11/01/2020   PROT 7.1 11/01/2020   ALBUMIN 4.1 11/01/2020   CALCIUM 9.4 11/01/2020   ANIONGAP 8 05/26/2020   EGFR 108 11/01/2020   Lab Results  Component Value Date   CHOL 213 (H) 11/01/2020   Lab Results  Component Value Date   HDL 70 11/01/2020   Lab Results  Component Value Date   LDLCALC 126 (H) 11/01/2020  Lab Results  Component Value Date   TRIG 94 11/01/2020    Lab Results  Component Value Date   CHOLHDL 3.0 11/01/2020   No results found for: HGBA1C     Assessment & Plan:   Problem List Items Addressed This Visit       Other   Fall    -had seizure on 8/13 and fell; has left foot pain as result of the fall      Relevant Orders   Ambulatory referral to Orthopedic Surgery   Left foot pain - Primary    -Rx. Ibuprofen -she states she is unable to walk -referral to ortho      Relevant Medications   ibuprofen (ADVIL) 800 MG tablet   Other Relevant Orders   Ambulatory referral to Orthopedic Surgery     Meds ordered this encounter  Medications   ibuprofen (ADVIL) 800 MG tablet    Sig: Take 1 tablet (800 mg total) by mouth every 8 (eight) hours as needed.    Dispense:  30 tablet    Refill:  0   Date:  03/07/2021   Location of Patient: Home Location of Provider: Office Consent was obtain for visit to be over via telehealth. I verified that I am speaking with the correct person using two identifiers.  I connected with  Fransico Setters on 03/07/21 via telephone and verified that I am speaking with the correct person using two identifiers.   I discussed the limitations of evaluation and management by telemedicine. The patient expressed understanding and agreed to proceed.  Time spent: 6 minutes   Noreene Larsson, NP

## 2021-03-12 ENCOUNTER — Telehealth: Payer: Self-pay | Admitting: Neurology

## 2021-03-12 NOTE — Telephone Encounter (Signed)
Patient called and requested a call back from the nurse. She'd like to know about what to expect from her visit to [facility]?   Patient seen at the ED for a seizure recently.

## 2021-03-13 NOTE — Telephone Encounter (Signed)
Pls let her know that I reviewed the notes from [facility], the visit at [facility] next week is to discuss how she is doing and whether or not she is interested in pursuing her surgical evaluation, which I strongly recommend she pursue. She should ask them all her questions and concerns about the next steps. Would she like to restart the low dose of Xcopri again and see if she tolerates it better this time?

## 2021-03-13 NOTE — Telephone Encounter (Signed)
Pt called and informed that Dr Karel Jarvis reviewed her [facility] notes and that at her appointment she would be talking about surgery. Pt stated that she was interested in doing the surgery. She does NOT want to restart the Xcopri because it caused her to hurt to bad.

## 2021-03-14 ENCOUNTER — Telehealth: Payer: Self-pay

## 2021-03-14 ENCOUNTER — Encounter: Payer: Self-pay | Admitting: Radiology

## 2021-03-14 NOTE — Telephone Encounter (Signed)
PCS forms  Copied Sleeved noted

## 2021-03-21 ENCOUNTER — Ambulatory Visit: Payer: Medicare Other | Admitting: Family Medicine

## 2021-03-29 DIAGNOSIS — Z0279 Encounter for issue of other medical certificate: Secondary | ICD-10-CM

## 2021-03-30 NOTE — Telephone Encounter (Signed)
Completed on 03-28-21 Pt picked up forms on 03-30-21

## 2021-04-04 ENCOUNTER — Telehealth: Payer: Self-pay

## 2021-04-04 NOTE — Telephone Encounter (Signed)
-----   Message from Van Clines, MD sent at 04/03/2021  2:38 PM EDT ----- Regarding: [facility] f/u Can you pls check with her if she went to her [facility] appointment? I don't see the notes. thanks

## 2021-04-04 NOTE — Telephone Encounter (Signed)
Pt called no voice mail no answer

## 2021-04-24 DIAGNOSIS — H04123 Dry eye syndrome of bilateral lacrimal glands: Secondary | ICD-10-CM | POA: Diagnosis not present

## 2021-04-29 ENCOUNTER — Other Ambulatory Visit: Payer: Self-pay | Admitting: Women's Health

## 2021-04-30 ENCOUNTER — Telehealth: Payer: Self-pay | Admitting: Adult Health

## 2021-04-30 ENCOUNTER — Other Ambulatory Visit: Payer: Self-pay

## 2021-04-30 ENCOUNTER — Ambulatory Visit: Payer: Medicare Other

## 2021-04-30 MED ORDER — MEDROXYPROGESTERONE ACETATE 150 MG/ML IM SUSP: 150.0000 mg | INTRAMUSCULAR | 0 refills | Status: DC

## 2021-04-30 NOTE — Telephone Encounter (Signed)
Patient called stating that she called her pharmacy to get a refill of her Depo shot but they told her that she needs a new order placed. Patient has an appointment today at 3:30 pm. Please contact pt when  her depo order has been placed.

## 2021-04-30 NOTE — Telephone Encounter (Signed)
Called pt to inform her that her refill request was approved. Two identifiers used. Pt confirmed understanding.

## 2021-05-02 ENCOUNTER — Ambulatory Visit: Payer: Medicare Other | Admitting: Neurology

## 2021-05-02 ENCOUNTER — Ambulatory Visit (INDEPENDENT_AMBULATORY_CARE_PROVIDER_SITE_OTHER): Payer: Medicare Other | Admitting: *Deleted

## 2021-05-02 ENCOUNTER — Other Ambulatory Visit: Payer: Self-pay

## 2021-05-02 DIAGNOSIS — Z308 Encounter for other contraceptive management: Secondary | ICD-10-CM

## 2021-05-02 MED ORDER — MEDROXYPROGESTERONE ACETATE 150 MG/ML IM SUSP
150.0000 mg | Freq: Once | INTRAMUSCULAR | Status: AC
  Administered 2021-05-02: 150 mg via INTRAMUSCULAR

## 2021-05-02 NOTE — Progress Notes (Signed)
   NURSE VISIT- INJECTION  SUBJECTIVE:  [name] is a 40 y.o. G3P3 female here for a Depo Provera for contraception/period management. She is a GYN patient.   OBJECTIVE:  There were no vitals taken for this visit.  Appears well, in no apparent distress  Injection administered in: Left deltoid  Meds ordered this encounter  Medications   medroxyPROGESTERone (DEPO-PROVERA) injection 150 mg    ASSESSMENT: GYN patient Depo Provera for contraception/period management PLAN: Follow-up: in 11-13 weeks for next Depo   Malachy Mood  05/02/2021 10:59 AM

## 2021-05-08 ENCOUNTER — Encounter: Payer: Self-pay | Admitting: Family Medicine

## 2021-05-08 ENCOUNTER — Other Ambulatory Visit: Payer: Self-pay

## 2021-05-08 ENCOUNTER — Ambulatory Visit (INDEPENDENT_AMBULATORY_CARE_PROVIDER_SITE_OTHER): Payer: Medicare Other | Admitting: Family Medicine

## 2021-05-08 VITALS — BP 128/85 | HR 73 | Resp 16 | Ht 62.5 in | Wt 129.1 lb

## 2021-05-08 DIAGNOSIS — E669 Obesity, unspecified: Secondary | ICD-10-CM

## 2021-05-08 DIAGNOSIS — Z23 Encounter for immunization: Secondary | ICD-10-CM

## 2021-05-08 DIAGNOSIS — H547 Unspecified visual loss: Secondary | ICD-10-CM | POA: Insufficient documentation

## 2021-05-08 DIAGNOSIS — Z13220 Encounter for screening for lipoid disorders: Secondary | ICD-10-CM

## 2021-05-08 DIAGNOSIS — F17200 Nicotine dependence, unspecified, uncomplicated: Secondary | ICD-10-CM

## 2021-05-08 DIAGNOSIS — H543 Unqualified visual loss, both eyes: Secondary | ICD-10-CM

## 2021-05-08 DIAGNOSIS — Z0000 Encounter for general adult medical examination without abnormal findings: Secondary | ICD-10-CM | POA: Diagnosis not present

## 2021-05-08 DIAGNOSIS — E559 Vitamin D deficiency, unspecified: Secondary | ICD-10-CM

## 2021-05-08 HISTORY — DX: Unqualified visual loss, both eyes: H54.3

## 2021-05-08 MED ORDER — ERGOCALCIFEROL 1.25 MG (50000 UT) PO CAPS: 50000.0000 [IU] | ORAL_CAPSULE | ORAL | 1 refills | Status: DC

## 2021-05-08 NOTE — Assessment & Plan Note (Signed)
Asked:confirms currently smokes cigarettes, 10/day Assess: Unwilling to set a quit date, but is cutting back Advise: needs to QUIT to reduce risk of cancer, cardio and cerebrovascular disease Assist: counseled for 5 minutes and literature provided Arrange: follow up in 2 to 4 months  

## 2021-05-08 NOTE — Patient Instructions (Addendum)
F/u in 5 months, call if you need me before  Flu vaccine today  Please work on cutting back on cigarettes, now at 10/day, need to quit  Start once weekly vit D  You are referred for eye exam  Please get covid vaccine at your pharmacy  Fasting lipid, cmp and eGFr and Vit D 1 week before   Thanks for choosing [facility], we consider it a privelige to serve you.

## 2021-05-08 NOTE — Assessment & Plan Note (Signed)

## 2021-05-08 NOTE — Progress Notes (Signed)
Ch visit   [name]     MRN: [id number]      DOB: 25-Feb-1981  HPI: Patient is in for annual physical exam. No other health concerns are expressed or addressed at the visit. Recent labs,  are reviewed. Immunization is reviewed , and  updated if needed.   PE: BP 128/85   Pulse 73   Resp 16   Ht 5' 2.5" (1.588 m)   Wt 129 lb 1.3 oz (58.6 kg)   SpO2 98%   BMI 23.23 kg/m   Pleasant  female, alert and oriented x 3, in no cardio-pulmonary distress. Afebrile. HEENT No facial trauma or asymetry. Sinuses non tender.  Extra occullar muscles intact.. External ears normal, . Neck: supple, no adenopathy,JVD or thyromegaly.No bruits.  Chest: Clear to ascultation bilaterally.No crackles or wheezes. Non tender to palpation  Cardiovascular system; Heart sounds normal,  S1 and  S2 ,no S3.  No murmur, or thrill. Apical beat not displaced Peripheral pulses normal.  Abdomen: Soft, non tender, no organomegaly or masses. No bruits. Bowel sounds normal. No guarding, tenderness or rebound.    Musculoskeletal exam: Full ROM of spine, hips , shoulders and knees. No deformity ,swelling or crepitus noted. No muscle wasting or atrophy.   Neurologic: Cranial nerves 2 to 12 intact. Power, tone ,sensation and reflexes normal throughout. No disturbance in gait. No tremor.  Skin: Intact, no ulceration, erythema , scaling or rash noted. Pigmentation normal throughout  Psych; Normal mood and affect. Judgement and concentration normal   A/P: Annual physical exam Annual exam as documented. Counseling done  re healthy lifestyle involving commitment to 150 minutes exercise per week, heart healthy diet, and attaining healthy weight.The importance of adequate sleep also discussed. Regular seat belt use and home safety, is also discussed. Changes in health habits are decided on by the patient with goals and time frames  set for achieving them. Immunization and cancer screening needs  are specifically addressed at this visit.   NICOTINE ADDICTION Asked:confirms currently smokes cigarettes, 10/day Assess: Unwilling to set a quit date, but is cutting back Advise: needs to QUIT to reduce risk of cancer, cardio and cerebrovascular disease Assist: counseled for 5 minutes and literature provided Arrange: follow up in 2 to 4 months

## 2021-05-25 ENCOUNTER — Telehealth: Payer: Self-pay | Admitting: Family Medicine

## 2021-05-25 NOTE — Telephone Encounter (Signed)
We filled out an aide request and faxed to [facility] on Sept 9. She can call [phone] to follow up and see if they have a waiting list or why they haven't contacted her yet

## 2021-05-25 NOTE — Telephone Encounter (Signed)
Pt needs aide

## 2021-05-28 NOTE — Telephone Encounter (Signed)
Pt states this one was denied --she needs another one

## 2021-05-28 NOTE — Telephone Encounter (Signed)
Ok form completed and put in your box to sign

## 2021-05-28 NOTE — Telephone Encounter (Signed)
Is there anything else besides seizures that she would qualify for an aide for? Is asking for another form but the one from Sept was denied

## 2021-06-04 ENCOUNTER — Other Ambulatory Visit: Payer: Medicare Other | Admitting: Adult Health

## 2021-06-06 NOTE — Telephone Encounter (Signed)
Spoke to patient and gave her [facility] #. She will contact [facility].

## 2021-06-06 NOTE — Telephone Encounter (Signed)
Yes, forms have been completed and faxed. she needs to call [facility] for an update. They do not update [place]. Number is in previous message

## 2021-06-06 NOTE — Telephone Encounter (Signed)
Patient called asking if these forms are completed yet?

## 2021-07-02 ENCOUNTER — Other Ambulatory Visit: Payer: Self-pay | Admitting: Family Medicine

## 2021-07-10 ENCOUNTER — Telehealth: Payer: Self-pay

## 2021-07-10 NOTE — Telephone Encounter (Signed)
Felicia from [employer] called, she received new personal services request, but has been unable to reach the patient to set the patient up for assessment.  Asked if nurse could give her a call back at [phone].

## 2021-07-12 NOTE — Telephone Encounter (Signed)
Pt states no one has called her she was given the number and she said she will call [facility]

## 2021-07-24 ENCOUNTER — Other Ambulatory Visit: Payer: Self-pay | Admitting: Adult Health

## 2021-07-25 ENCOUNTER — Ambulatory Visit: Payer: Medicaid Other

## 2021-07-31 ENCOUNTER — Other Ambulatory Visit: Payer: Self-pay | Admitting: Neurology

## 2021-08-01 NOTE — Telephone Encounter (Signed)
She needs to schedule f/u, refills can be sent until her f/u, thanks

## 2021-08-08 ENCOUNTER — Telehealth: Payer: Self-pay

## 2021-08-08 NOTE — Telephone Encounter (Signed)
PCS forms (office visit 08/10/2021) to complete forms. ( In blue folder)  Copied Noted Sleeved

## 2021-08-10 ENCOUNTER — Encounter: Payer: Self-pay | Admitting: Family Medicine

## 2021-08-10 ENCOUNTER — Ambulatory Visit (INDEPENDENT_AMBULATORY_CARE_PROVIDER_SITE_OTHER): Payer: Commercial Managed Care - HMO | Admitting: Family Medicine

## 2021-08-10 ENCOUNTER — Other Ambulatory Visit: Payer: Self-pay

## 2021-08-10 VITALS — BP 135/76 | HR 95 | Resp 16 | Ht 62.5 in | Wt 131.8 lb

## 2021-08-10 DIAGNOSIS — R569 Unspecified convulsions: Secondary | ICD-10-CM

## 2021-08-10 DIAGNOSIS — F17210 Nicotine dependence, cigarettes, uncomplicated: Secondary | ICD-10-CM

## 2021-08-10 DIAGNOSIS — F17200 Nicotine dependence, unspecified, uncomplicated: Secondary | ICD-10-CM

## 2021-08-10 DIAGNOSIS — M25511 Pain in right shoulder: Secondary | ICD-10-CM | POA: Diagnosis not present

## 2021-08-10 MED ORDER — PREDNISONE 5 MG PO TABS: 5.0000 mg | ORAL_TABLET | Freq: Two times a day (BID) | ORAL | 0 refills | Status: AC

## 2021-08-10 MED ORDER — NAPROXEN SODIUM 220 MG PO TABS: 220.0000 mg | ORAL_TABLET | Freq: Two times a day (BID) | ORAL | 0 refills | Status: DC | PRN

## 2021-08-10 MED ORDER — FAMOTIDINE 20 MG PO TABS: 20.0000 mg | ORAL_TABLET | Freq: Two times a day (BID) | ORAL | 0 refills | Status: DC

## 2021-08-10 NOTE — Patient Instructions (Signed)
Follow-up as before call if you need me sooner.  5-day course of anti-inflammatories with Pepcid for stomach protection is prescribed for your right shoulder pain.  If it persists and we will refer you to orthopedics.  You are referred to a neurologist because of uncontrolled seizures disorder.  Please commit to cutting back and planning to quit smoking.  You currently smoke 1 pack of cigarettes daily and is.  Multiple health challenges.  Thanks for choosing [facility], we consider it a privelige to serve you.

## 2021-08-10 NOTE — Assessment & Plan Note (Signed)
1 month history, no inciting trauma, short course of anti-inflammatory, if persist will refer to orthopedics.

## 2021-08-10 NOTE — Assessment & Plan Note (Addendum)
Asked:confirms currently smokes cigarettes 1 PPD Assess: Unwilling to set a quit date, not actively trying to cut back currently Advise: needs to QUIT to reduce risk of cancer, cardio and cerebrovascular disease Assist: counseled for 5 minutes and literature provided Arrange: follow up in 2 to 4 months

## 2021-08-10 NOTE — Assessment & Plan Note (Signed)
Uncontrolled seizures, reports 2 seizures in December.  Needs follow-up with her neurologist referral is entered.

## 2021-08-12 ENCOUNTER — Encounter: Payer: Self-pay | Admitting: Family Medicine

## 2021-08-12 NOTE — Progress Notes (Signed)
° °  [name]     MRN: [id number]      DOB: June 12, 1981   HPI [name] is here for follow up and re-evaluation of chronic medical conditions, medication management and review of any available recent lab and radiology data.  Preventive health is updated, specifically  Cancer screening and Immunization.   C/o 2 seizures in December needs Neurology f/u appt 1 month h/o acute right arm pan / shoulder pain with certain movements Smoking 1 PPD, states living conditions contribute as they are stressful  ROS Denies recent fever or chills. Denies sinus pressure, nasal congestion, ear pain or sore throat. Denies chest congestion, productive cough or wheezing. Denies chest pains, palpitations and leg swelling Denies abdominal pain, nausea, vomiting,diarrhea or constipation.   Denies dysuria, frequency, hesitancy or incontinence. Denies skin break down or rash.   PE  BP 135/76    Pulse 95    Resp 16    Ht 5' 2.5" (1.588 m)    Wt 131 lb 12.8 oz (59.8 kg)    SpO2 98%    BMI 23.72 kg/m   Patient alert and oriented and in no cardiopulmonary distress.  HEENT: No facial asymmetry, EOMI,     Neck supple .  Chest: Clear to auscultation bilaterally.  CVS: S1, S2 no murmurs, no S3.Regular rate.  ABD: Soft non tender.   Ext: No edema  MS: Adequate ROM spine,  hips and knees.Decreased in right arm, tender over biceps muscle on mid arm  Skin: Intact, no ulcerations or rash noted.  Psych: Good eye contact, normal affect. Memory intact not anxious or depressed appearing.  CNS: CN 2-12 intact, power,  normal throughout.no focal deficits noted.   Assessment & Plan  Right shoulder pain 1 month history, no inciting trauma, short course of anti-inflammatory, if persist will refer to orthopedics.  Seizure (HCC) Uncontrolled seizures, reports 2 seizures in December.  Needs follow-up with her neurologist referral is entered.    NICOTINE ADDICTION Asked:confirms currently smokes cigarettes  1 PPD Assess: Unwilling to set a quit date, not actively trying to cut back currently Advise: needs to QUIT to reduce risk of cancer, cardio and cerebrovascular disease Assist: counseled for 5 minutes and literature provided Arrange: follow up in 2 to 4 months

## 2021-08-20 ENCOUNTER — Other Ambulatory Visit: Payer: Self-pay | Admitting: Family Medicine

## 2021-08-20 DIAGNOSIS — R569 Unspecified convulsions: Secondary | ICD-10-CM

## 2021-08-20 NOTE — Progress Notes (Signed)
Amb neuro

## 2021-08-20 NOTE — Addendum Note (Signed)
Addended by: Kerri Perches on: 08/20/2021 07:21 AM   Modules accepted: Orders

## 2021-08-24 ENCOUNTER — Ambulatory Visit (INDEPENDENT_AMBULATORY_CARE_PROVIDER_SITE_OTHER): Payer: Medicare Other | Admitting: Neurology

## 2021-08-24 ENCOUNTER — Other Ambulatory Visit: Payer: Medicare Other | Admitting: Adult Health

## 2021-08-24 ENCOUNTER — Other Ambulatory Visit: Payer: Self-pay

## 2021-08-24 ENCOUNTER — Encounter: Payer: Self-pay | Admitting: Neurology

## 2021-08-24 VITALS — BP 143/83 | HR 70 | Ht 62.0 in | Wt 130.4 lb

## 2021-08-24 DIAGNOSIS — G40019 Localization-related (focal) (partial) idiopathic epilepsy and epileptic syndromes with seizures of localized onset, intractable, without status epilepticus: Secondary | ICD-10-CM | POA: Diagnosis not present

## 2021-08-24 DIAGNOSIS — G43009 Migraine without aura, not intractable, without status migrainosus: Secondary | ICD-10-CM | POA: Diagnosis not present

## 2021-08-24 MED ORDER — ZONISAMIDE 100 MG PO CAPS: ORAL_CAPSULE | ORAL | 11 refills | Status: DC

## 2021-08-24 MED ORDER — BRIVARACETAM 100 MG PO TABS: ORAL_TABLET | ORAL | 5 refills | Status: DC

## 2021-08-24 MED ORDER — OXCARBAZEPINE 600 MG PO TABS: ORAL_TABLET | ORAL | 11 refills | Status: DC

## 2021-08-24 MED ORDER — NORTRIPTYLINE HCL 25 MG PO CAPS: 25.0000 mg | ORAL_CAPSULE | Freq: Every day | ORAL | 11 refills | Status: DC
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
## 2021-08-24 NOTE — Patient Instructions (Signed)
Start Briviact samples: take 50mg  twice a day for 1 week, then take 100mg  twice a day. Once done with samples, refills will be at the pharmacy for Briviact 100mg : take 1 tablet twice a day  2. Increase nortriptyline to 25mg : take 1 capsule every night  3. Continue oxcarbazepine 600mg : take 1 tablet in AM, 2 tablets in PM  4. Continue zonisamide 100mg : take 1 capsule in AM, 5 capsules in PM  5. Discuss Ortho referral with PCP  6. Call [facility] to schedule follow-up   7. Follow-up in 4-5 months, call for any changes   Seizure Precautions: 1. If medication has been prescribed for you to prevent seizures, take it exactly as directed.  Do not stop taking the medicine without talking to your doctor first, even if you have not had a seizure in a long time.   2. Avoid activities in which a seizure would cause danger to yourself or to others.  Don't operate dangerous machinery, swim alone, or climb in high or dangerous places, such as on ladders, roofs, or girders.  Do not drive unless your doctor says you may.  3. If you have any warning that you may have a seizure, lay down in a safe place where you can't hurt yourself.    4.  No driving for 6 months from last seizure, as per [facility].   Please refer to the following link on the [place] website for more information: [email or web address]   5.  Maintain good sleep hygiene. Avoid alcohol.  6.  Notify your neurology if you are planning pregnancy or if you become pregnant.  7.  Contact your doctor if you have any problems that may be related to the medicine you are taking.  8.  Call 911 and bring the patient back to the ED if:        A.  The seizure lasts longer than 5 minutes.       B.  The patient doesn't awaken shortly after the seizure  C.  The patient has new problems such as difficulty seeing, speaking or moving  D.  The patient was injured  during the seizure  E.  The patient has a temperature over 102 F (39C)  F.  The patient vomited and now is having trouble breathing

## 2021-08-24 NOTE — Progress Notes (Signed)
Medication Samples have been provided to the patient.  Drug name: briviact       Strength: 50mg         Qty: 1  LOT  Exp.Date: 10/2022  Dosing instructions: take 1 pill BID for a week  The patient has been instructed regarding the correct time, dose, and frequency of taking this medication, including desired effects and most common side effects.     Medication Samples have been provided to the patient.  Drug name: briviact       Strength: 100 mg        Qty: 1  LOT: 11/2022  Exp.Date: 09/2023  Dosing instructions: take one tablet BID start after taken 50mg  tablets   The patient has been instructed regarding the correct time, dose, and frequency of taking this medication, including desired effects and most common side effects.

## 2021-08-29 ENCOUNTER — Other Ambulatory Visit: Payer: Self-pay | Admitting: Neurology

## 2021-09-19 ENCOUNTER — Other Ambulatory Visit: Payer: Self-pay

## 2021-09-19 ENCOUNTER — Telehealth: Payer: Self-pay | Admitting: Neurology

## 2021-09-19 MED ORDER — TIZANIDINE HCL 4 MG PO TABS: 4.0000 mg | ORAL_TABLET | Freq: Four times a day (QID) | ORAL | 2 refills | Status: DC | PRN

## 2021-09-19 MED ORDER — NORTRIPTYLINE HCL 25 MG PO CAPS: 25.0000 mg | ORAL_CAPSULE | Freq: Every day | ORAL | 11 refills | Status: DC

## 2021-09-19 NOTE — Telephone Encounter (Signed)
Refill sent to pharmacy.   

## 2021-09-19 NOTE — Telephone Encounter (Signed)
1. Which medications need refilled? (List name and dosage, if known) tizanidine and nortriptyline ? ?2. Which pharmacy/location is medication to be sent to? (include street and city if local pharmacy) [facility] ? ? ?

## 2021-09-25 ENCOUNTER — Telehealth: Payer: Self-pay | Admitting: Women's Health

## 2021-09-25 NOTE — Telephone Encounter (Signed)
Patient has a pap scheduled tomorrow and would like to get her depo restarted. She would need refills. Please advise.  ?

## 2021-09-26 ENCOUNTER — Other Ambulatory Visit: Payer: Medicare Other | Admitting: Advanced Practice Midwife

## 2021-09-26 ENCOUNTER — Other Ambulatory Visit: Payer: Self-pay | Admitting: Neurology

## 2021-09-26 MED ORDER — BRIVARACETAM 100 MG PO TABS: ORAL_TABLET | ORAL | 5 refills | Status: DC

## 2021-09-26 MED ORDER — OXCARBAZEPINE 600 MG PO TABS: ORAL_TABLET | ORAL | 11 refills | Status: DC

## 2021-09-26 MED ORDER — ZONISAMIDE 100 MG PO CAPS: ORAL_CAPSULE | ORAL | 11 refills | Status: DC

## 2021-09-26 NOTE — Telephone Encounter (Signed)
Pt called in stating she got confused and told Dr. Delice Lesch to send her medication to [facility] when she was here last, but she didn't realize all her medications were being sent there. She needs all of her medications to be sent to [facility] so they will put them in pill packs for her.  ?

## 2021-09-27 ENCOUNTER — Other Ambulatory Visit: Payer: Self-pay

## 2021-09-27 ENCOUNTER — Other Ambulatory Visit: Payer: Self-pay | Admitting: Neurology

## 2021-09-27 ENCOUNTER — Other Ambulatory Visit (HOSPITAL_COMMUNITY)
Admission: RE | Admit: 2021-09-27 | Discharge: 2021-09-27 | Disposition: A | Discharge status: Home / Self Care | Payer: Medicare Other | Source: Ambulatory Visit | Attending: Advanced Practice Midwife | Admitting: Advanced Practice Midwife

## 2021-09-27 ENCOUNTER — Encounter: Payer: Self-pay | Admitting: Obstetrics & Gynecology

## 2021-09-27 ENCOUNTER — Ambulatory Visit (INDEPENDENT_AMBULATORY_CARE_PROVIDER_SITE_OTHER): Payer: Medicare Other | Admitting: Obstetrics & Gynecology

## 2021-09-27 VITALS — BP 130/80 | HR 84 | Wt 139.6 lb

## 2021-09-27 DIAGNOSIS — Z01419 Encounter for gynecological examination (general) (routine) without abnormal findings: Secondary | ICD-10-CM

## 2021-09-27 DIAGNOSIS — Z3042 Encounter for surveillance of injectable contraceptive: Secondary | ICD-10-CM

## 2021-09-27 DIAGNOSIS — Z3202 Encounter for pregnancy test, result negative: Secondary | ICD-10-CM

## 2021-09-27 DIAGNOSIS — Z1151 Encounter for screening for human papillomavirus (HPV): Secondary | ICD-10-CM | POA: Diagnosis not present

## 2021-09-27 LAB — POCT URINE PREGNANCY: Preg Test, Ur: NEGATIVE

## 2021-09-27 MED ORDER — MEDROXYPROGESTERONE ACETATE 150 MG/ML IM SUSP
150.0000 mg | Freq: Once | INTRAMUSCULAR | Status: AC
  Administered 2021-09-27: 12:00:00 150 mg via INTRAMUSCULAR

## 2021-09-27 MED ORDER — MEDROXYPROGESTERONE ACETATE 150 MG/ML IM SUSP: 150.0000 mg | INTRAMUSCULAR | 4 refills | Status: DC

## 2021-09-27 NOTE — Progress Notes (Signed)
? ?WELL-WOMAN EXAMINATION ?Patient name: [name] MRN [id number]  Date of birth: 1981-03-28 ?Chief Complaint:   ?Gynecologic Exam (Restart depo) ? ?History of Present Illness:   ?[name] is a 41 y.o. G3P3  female being seen today for a routine well-woman exam.  ?Today she notes: no acute complaints or concerns ? ?No menses while on Depot.  Not currently sexually active, but wants to continue to be on the safe side. ? ? ?No LMP recorded. Patient has had an injection. ? ?The current method of family planning is Depo-Provera injections.  ? ? ?Last pap 02/2019.  ?Last mammogram: 12/2020. ?Last colonoscopy: n/a ? ?Depression screen [facility] 2/9 05/08/2021 03/07/2021 01/25/2021 12/13/2020 11/01/2020  ?Decreased Interest 3 0 3 0 0  ?Down, Depressed, Hopeless 0 3 3 0 0  ?PHQ - 2 Score 3 3 6  0 0  ?Altered sleeping 0 0 3 - -  ?Tired, decreased energy 1 0 3 - -  ?Change in appetite 1 0 3 - -  ?Feeling bad or failure about yourself  0 0 3 - -  ?Trouble concentrating 0 0 3 - -  ?Moving slowly or fidgety/restless 0 0 3 - -  ?Suicidal thoughts 0 0 1 - -  ?PHQ-9 Score 5 3 25  - -  ?Difficult doing work/chores - Not difficult at all - - -  ?Some recent data might be hidden  ? ? ? ? ?Review of Systems:   ?Pertinent items are noted in HPI ?Denies any headaches, blurred vision, fatigue, shortness of breath, chest pain, abdominal pain, bowel movements, urination, or intercourse unless otherwise stated above. ? ?Pertinent History Reviewed:  ?Reviewed past medical,surgical, social and family history.  ?Reviewed problem list, medications and allergies. ?Physical Assessment:  ? ?Vitals:  ? 09/27/21 1141  ?BP: 130/80  ?Pulse: 84  ?Weight: 139 lb 9.6 oz (63.3 kg)  ?Body mass index is 25.53 kg/m?. ?  ?     Physical Examination:  ? General appearance - well appearing, and in no distress ? Mental status - alert, oriented to person, place, and time ? Psych:  She has a normal mood and affect ? Skin - warm and dry, normal color, no suspicious  lesions noted ? Chest - effort normal, all lung fields clear to auscultation bilaterally ? Heart - normal rate and regular rhythm ? Neck:  midline trachea, no thyromegaly or nodules ? Breasts - breasts appear normal, no suspicious masses, no skin or nipple changes or  axillary nodes ? Abdomen - soft, nontender, nondistended, no masses or organomegaly ? Pelvic - VULVA: normal appearing vulva with no masses, tenderness or lesions  VAGINA: normal appearing vagina with normal color and discharge, no lesions  CERVIX: normal appearing cervix without discharge or lesions, no CMT ? Thin prep pap is done with HR HPV cotesting ? UTERUS: uterus is felt to be normal size, shape, consistency and nontender  ? ADNEXA: No adnexal masses or tenderness noted. ? Extremities:  No swelling or varicosities noted ? ?Chaperone:   ? ? ?Assessment & Plan:  ?1) Well-Woman Exam ?-pap collected, reviewed screening guidelines ?-continue yearly mammograms ? ?2) Contraceptive Management ?- plan to continue with Depot q 64mos ? ?Orders Placed This Encounter  ?Procedures  ? POCT urine pregnancy  ? ? ?Meds:  ?Meds ordered this encounter  ?Medications  ? medroxyPROGESTERone (DEPO-PROVERA) 150 MG/ML injection  ?  Sig: Inject 1 mL (150 mg total) into the muscle every 3 (three) months for 1 dose.  ?  Dispense:  1 mL  ?  Refill:  4  ? medroxyPROGESTERone (DEPO-PROVERA) injection 150 mg  ? ? ?Follow-up: Return in about 3 months (around 12/28/2021) for Depot shot, 68yr annual. ? ? ?Myna Hidalgo, DO ?Attending Obstetrician & Gynecologist, Faculty Practice ?Center for [employer], [facility] ? ? ?

## 2021-09-28 LAB — CYTOLOGY - PAP
Adequacy: ABSENT
Comment: NEGATIVE
Diagnosis: NEGATIVE
High risk HPV: NEGATIVE

## 2021-10-10 ENCOUNTER — Other Ambulatory Visit: Payer: Self-pay

## 2021-10-10 ENCOUNTER — Ambulatory Visit (INDEPENDENT_AMBULATORY_CARE_PROVIDER_SITE_OTHER): Payer: Medicare Other | Admitting: Family Medicine

## 2021-10-10 ENCOUNTER — Encounter: Payer: Self-pay | Admitting: Family Medicine

## 2021-10-10 VITALS — BP 122/85 | HR 83 | Ht 62.0 in | Wt 140.1 lb

## 2021-10-10 DIAGNOSIS — F322 Major depressive disorder, single episode, severe without psychotic features: Secondary | ICD-10-CM

## 2021-10-10 DIAGNOSIS — F17200 Nicotine dependence, unspecified, uncomplicated: Secondary | ICD-10-CM

## 2021-10-10 DIAGNOSIS — R569 Unspecified convulsions: Secondary | ICD-10-CM

## 2021-10-10 DIAGNOSIS — M25511 Pain in right shoulder: Secondary | ICD-10-CM

## 2021-10-10 NOTE — Patient Instructions (Signed)
F/u in august , call if you need me sooner ? ? You are referred to Orthopedics and to psychiatry ? ?Please work on quitting smoking ? ?Thanks for choosing [facility], we consider it a privelige to serve you. ? ?

## 2021-10-10 NOTE — Assessment & Plan Note (Signed)
Denies any recent seizures, managed by Neurology ?

## 2021-10-10 NOTE — Progress Notes (Signed)
? ?  [name]     MRN: [id number]      DOB: May 04, 1981 ? ? ?HPI ?[name] is here for follow up and re-evaluation of chronic medical conditions, medication management and review of any available recent lab and radiology data.  ?Preventive health is updated, specifically  Cancer screening and Immunization.   ? 2 month h/o right shoulder pain rated at 8 and limiting  mobility, needs Ortho eval ? ?ROS ?Denies recent fever or chills. ?Denies sinus pressure, nasal congestion, ear pain or sore throat. ?Denies chest congestion, productive cough or wheezing. ?Denies chest pains, palpitations and leg swelling ?Denies abdominal pain, nausea, vomiting,diarrhea or constipation.   ?Denies dysuria, frequency, hesitancy or incontinence. ?Denies joint pain, swelling and limitation in mobility. ?Denies headaches, seizures, numbness, or tingling. ?C/o uncontrolled depressionDenies skin break down or rash. ? ? ?PE ? ?BP 122/85 (BP Location: Left Arm)   Pulse 83   Ht 5\' 2"  (1.575 m)   Wt 140 lb 1.9 oz (63.6 kg)   SpO2 99%   BMI 25.63 kg/m?  ? ?Patient alert and oriented and in no cardiopulmonary distress. ? ?HEENT: No facial asymmetry, EOMI,     Neck supple . ? ?Chest: Clear to auscultation bilaterally. ? ?CVS: S1, S2 no murmurs, no S3.Regular rate. ? ?ABD: Soft non tender.  ? ?Ext: No edema ? ?MS: Adequate ROM spine, s, hips and knees.decreased in right shoulder ? ?Skin: Intact, no ulcerations or rash noted. ? ?Psych: Good eye contact, flat  affect. Memory intact anxious at times tearful and depressed appearing. ? ?CNS: CN 2-12 intact, power,  normal throughout.no focal deficits noted. ? ? ?Assessment & Plan ? ?Depression, major, single episode, severe ([place]) ?Urgent referral to Psych and to therapist,already on amitriptyline and drug interaction with SSRI. Pact top not hurtherself or anyone buit to seek ED attention if needed ? ?NICOTINE ADDICTION ?Asked:confirms currently smokes cigarettes 1 PPD ?Assess: Unwilling to set  a quit date, but is cutting back ?Advise: needs to QUIT to reduce risk of cancer, cardio and cerebrovascular disease ?Assist: counseled for 5 minutes and literature provided ?Arrange: follow up in 2 to 4 months ? ? ?Right shoulder pain ?perisaitrent x 2 months. No trauma, refer Ortho ? ?Seizure ([place]) ?Denies any recent seizures, managed by Neurology ? ?

## 2021-10-10 NOTE — Assessment & Plan Note (Addendum)
Urgent referral to Psych and to therapist,already on amitriptyline and drug interaction with SSRI. Pact top not hurtherself or anyone buit to seek ED attention if needed ?

## 2021-10-10 NOTE — Assessment & Plan Note (Signed)
perisaitrent x 2 months. No trauma, refer Ortho ?

## 2021-10-10 NOTE — Assessment & Plan Note (Signed)
Asked:confirms currently smokes cigarettes 1 PPD Assess: Unwilling to set a quit date, but is cutting back Advise: needs to QUIT to reduce risk of cancer, cardio and cerebrovascular disease Assist: counseled for 5 minutes and literature provided Arrange: follow up in 2 to 4 months  

## 2021-10-11 ENCOUNTER — Ambulatory Visit (INDEPENDENT_AMBULATORY_CARE_PROVIDER_SITE_OTHER): Payer: Medicare Other

## 2021-10-11 ENCOUNTER — Ambulatory Visit: Payer: Medicare Other | Admitting: Orthopaedic Surgery

## 2021-10-11 ENCOUNTER — Encounter: Payer: Self-pay | Admitting: Orthopaedic Surgery

## 2021-10-11 ENCOUNTER — Ambulatory Visit (INDEPENDENT_AMBULATORY_CARE_PROVIDER_SITE_OTHER): Payer: Medicare Other | Admitting: Orthopaedic Surgery

## 2021-10-11 VITALS — BP 132/86 | HR 86 | Ht 62.0 in | Wt 140.0 lb

## 2021-10-11 DIAGNOSIS — G8929 Other chronic pain: Secondary | ICD-10-CM

## 2021-10-11 DIAGNOSIS — M25511 Pain in right shoulder: Secondary | ICD-10-CM | POA: Diagnosis not present

## 2021-10-11 NOTE — Patient Instructions (Signed)

## 2021-10-11 NOTE — Progress Notes (Signed)
? ?Subjective:  ? ? Patient ID: [name], female    DOB: [birth year], 41 y.o.   MRN: [id number] ? ?HPI ?She has pain in the right shoulder for over two months.  She has no injury.  It hurts to raise the hand over head.  She has pain putting on her bra.  She has no redness, no numbness, no neck pain.  She has tried ice, heat, Tylenol, Advil with no help. She was seen at Dr. Anthony Sar office recently and I have reviewed the notes.  ? ? ?Review of Systems  ?Constitutional:  Positive for activity change.  ?Musculoskeletal:  Positive for arthralgias.  ?All other systems reviewed and are negative. ?For Review of Systems, all other systems reviewed and are negative. ? ?The following is a summary of the past history medically, past history surgically, known current medicines, social history and family history.  This information is gathered electronically by the computer from prior information and documentation.  I review this each visit and have found including this information at this point in the chart is beneficial and informative.  ? ?Past Medical History:  ?Diagnosis Date  ? Back pain   ? Chronic headaches   ? Depression   ? Fall 03/07/2021  ? Genital warts   ? herpes  ? Herpes   ? Lip injury 01/25/2021  ? Lower lip trauma on 01/19/2021 during seizure, treated at [facility] urgent care  ? Seizures (HCC) 03/15/2016  ? Vision loss, bilateral 05/08/2021  ? ? ?Past Surgical History:  ?Procedure Laterality Date  ? CESAREAN SECTION    ? x 3  ? ? ?Current Outpatient Medications on File Prior to Visit  ?Medication Sig Dispense Refill  ? brivaracetam (BRIVIACT) 100 MG TABS tablet take 1 tablet twice a day 60 tablet 5  ? ergocalciferol (VITAMIN D2) 1.25 MG (50000 UT) capsule Take 1 capsule (50,000 Units total) by mouth once a week. One capsule once weekly 12 capsule 1  ? famciclovir (FAMVIR) 250 MG tablet TAKE ONE (1) TABLET BY MOUTH TWICE DAILY 28 tablet 10  ? famotidine (PEPCID) 20 MG tablet Take 1 tablet (20 mg total) by mouth 2  (two) times daily. 20 tablet 0  ? medroxyPROGESTERone (DEPO-PROVERA) 150 MG/ML injection Inject 1 mL (150 mg total) into the muscle every 3 (three) months for 1 dose. 1 mL 4  ? naproxen sodium (ALEVE) 220 MG tablet Take 1 tablet (220 mg total) by mouth 2 (two) times daily as needed. 10 tablet 0  ? nortriptyline (PAMELOR) 25 MG capsule Take 1 capsule (25 mg total) by mouth at bedtime. 30 capsule 11  ? NURTEC 75 MG TBDP PLACE 1 TABLET ON TONGUE AND ALLOW TO DISSOLVE AS NEEDED FOR MIGRAINE. DO NOT TAKE MORE THAN 1 DOSE IN 24 HOURS 10 tablet 10  ? oxcarbazepine (TRILEPTAL) 600 MG tablet TAKE 1 TABLET BY MOUTH IN THE MORNING ~TAKE 2 TABLETS AT BEDTIME 90 tablet 11  ? tiZANidine (ZANAFLEX) 4 MG tablet Take 1 tablet (4 mg total) by mouth every 6 (six) hours as needed for muscle spasms. 30 tablet 2  ? zonisamide (ZONEGRAN) 100 MG capsule Take 1 cap in AM, 5 caps in PM 180 capsule 11  ? ?No current facility-administered medications on file prior to visit.  ? ? ?Social History  ? ?Socioeconomic History  ? Marital status: Single  ?  Spouse name: Not on file  ? Number of children: 2  ? Years of education: 30, GED  ? Highest education  level: Not on file  ?Occupational History  ?  Comment: na  ?Tobacco Use  ? Smoking status: Every Day  ?  Packs/day: 1.00  ?  Years: 10.00  ?  Pack years: 10.00  ?  Types: Cigarettes  ? Smokeless tobacco: Never  ?Vaping Use  ? Vaping Use: Never used  ?Substance and Sexual Activity  ? Alcohol use: Not Currently  ?  Alcohol/week: 2.0 standard drinks  ?  Types: 2 Cans of beer per week  ?  Comment: last drink in December  ? Drug use: Yes  ?  Types: Marijuana  ?  Comment: everyday  ? Sexual activity: Not Currently  ?  Birth control/protection: Injection  ?Other Topics Concern  ? Not on file  ?Social History Narrative  ? Lives  With 1 child in home  ?   ? Caffeine -Pepsi 2 daily  ?    ? Right handed  ?   ? Highest level of edu- 11 grade  ?   ? Disabled  ? ?Social Determinants of Health  ? ?Financial  Resource Strain: Low Risk   ? Difficulty of Paying Living Expenses: Not hard at all  ?Food Insecurity: No Food Insecurity  ? Worried About Programme researcher, broadcasting/film/videounning Out of Food in the Last Year: Never true  ? Ran Out of Food in the Last Year: Never true  ?Transportation Needs: No Transportation Needs  ? Lack of Transportation (Medical): No  ? Lack of Transportation (Non-Medical): No  ?Physical Activity: Inactive  ? Days of Exercise per Week: 0 days  ? Minutes of Exercise per Session: 0 min  ?Stress: No Stress Concern Present  ? Feeling of Stress : Not at all  ?Social Connections: Socially Isolated  ? Frequency of Communication with Friends and Family: More than three times a week  ? Frequency of Social Gatherings with Friends and Family: More than three times a week  ? Attends Religious Services: Never  ? Active Member of Clubs or Organizations: No  ? Attends BankerClub or Organization Meetings: Never  ? Marital Status: Never married  ?Intimate Partner Violence: Not At Risk  ? Fear of Current or Ex-Partner: No  ? Emotionally Abused: No  ? Physically Abused: No  ? Sexually Abused: No  ? ? ?Family History  ?Problem Relation Age of Onset  ? Hypertension Mother   ? Healthy Father   ? ? ?BP 132/86   Pulse 86   Ht 5\' 2"  (1.575 m)   Wt 140 lb (63.5 kg)   BMI 25.61 kg/m?  ? ?Body mass index is 25.61 kg/m?. ? ?   ?Objective:  ? Physical Exam ?Vitals and nursing note reviewed. Exam conducted with a chaperone present.  ?Constitutional:   ?   Appearance: She is well-developed.  ?HENT:  ?   Head: Normocephalic and atraumatic.  ?Eyes:  ?   Conjunctiva/sclera: Conjunctivae normal.  ?   Pupils: Pupils are equal, round, and reactive to light.  ?Cardiovascular:  ?   Rate and Rhythm: Normal rate and regular rhythm.  ?Pulmonary:  ?   Effort: Pulmonary effort is normal.  ?Abdominal:  ?   Palpations: Abdomen is soft.  ?Musculoskeletal:  ?     Arms: ? ?   Cervical back: Normal range of motion and neck supple.  ?Skin: ?   General: Skin is warm and dry.   ?Neurological:  ?   Mental Status: She is alert and oriented to person, place, and time.  ?   Cranial Nerves: No cranial  nerve deficit.  ?   Motor: No abnormal muscle tone.  ?   Coordination: Coordination normal.  ?   Deep Tendon Reflexes: Reflexes are normal and symmetric. Reflexes normal.  ?Psychiatric:     ?   Behavior: Behavior normal.     ?   Thought Content: Thought content normal.     ?   Judgment: Judgment normal.  ?X-rays were done of the right shoulder, reported separately. ? ? ? ? ?   ?Assessment & Plan:  ? ?Encounter Diagnosis  ?Name Primary?  ? Chronic pain in right shoulder Yes  ? ?PROCEDURE NOTE: ? ?The patient request injection, verbal consent was obtained. ? ?The right shoulder was prepped appropriately after time out was performed.  ? ?Sterile technique was observed and injection of 1 cc of DepoMedrol 40mg  with several cc's of plain xylocaine. Anesthesia was provided by ethyl chloride and a 20-gauge needle was used to inject the shoulder area. A posterior approach was used.  The injection was tolerated well. ? ?A band aid dressing was applied. ? ?The patient was advised to apply ice later today and tomorrow to the injection sight as needed. ? ?Take one Aleve twice a day. ? ?Return in two weeks. ? ?She may need PT/OT and/or MRI. ? ?Call if any problem. ? ?Precautions discussed. ? ?Electronically Signed ? , MD ?3/23/20239:14 AM ? ? ?

## 2021-10-24 ENCOUNTER — Telehealth: Payer: Self-pay | Admitting: *Deleted

## 2021-10-24 NOTE — Chronic Care Management (AMB) (Signed)
?  Care Management  ? ?Outreach Note ? ?10/24/2021 ?Name: [name] MRN: [id number] DOB: 1980/11/20 ? ?Referred by: Kerri Perches, MD ?Reason for referral : Care Coordination (Initial outreach to schedule referral with SW ) ? ? ?An unsuccessful telephone outreach was attempted today. The patient was referred to the case management team for assistance with care management and care coordination.  ? ?Follow Up Plan:  ?A HIPAA compliant phone message was left for the patient providing contact information and requesting a return call.  ?The care management team will reach out to the patient again over the next 14 days.  ?If patient returns call to provider office, please advise to call Embedded Care Management Care Guide Misty Stanley * at [phone].* ? ?[name]  ?Care Guide, Embedded Care Coordination ?[place]  Care Management  ?Direct Dial: [phone] ? ?

## 2021-10-25 ENCOUNTER — Ambulatory Visit (INDEPENDENT_AMBULATORY_CARE_PROVIDER_SITE_OTHER): Payer: Medicare Other | Admitting: Orthopaedic Surgery

## 2021-10-25 ENCOUNTER — Encounter: Payer: Self-pay | Admitting: Orthopaedic Surgery

## 2021-10-25 VITALS — BP 112/72 | HR 72 | Ht 62.0 in | Wt 140.0 lb

## 2021-10-25 DIAGNOSIS — G8929 Other chronic pain: Secondary | ICD-10-CM | POA: Diagnosis not present

## 2021-10-25 DIAGNOSIS — M25511 Pain in right shoulder: Secondary | ICD-10-CM | POA: Diagnosis not present

## 2021-10-25 MED ORDER — DICLOFENAC SODIUM 75 MG PO TBEC: 75.0000 mg | DELAYED_RELEASE_TABLET | Freq: Two times a day (BID) | ORAL | 2 refills | Status: DC

## 2021-10-25 NOTE — Progress Notes (Signed)
My shoulder still hurts ? ?She said the injection in the right shoulder only helped a little. She has pain still. She has no new trauma, no swelling, no numbness, no redness. ? ?Examination of right Upper Extremity is done. ? Inspection: ?  Overall:  Elbow non-tender without crepitus or defects, forearm non-tender without crepitus or defects, wrist non-tender without crepitus or defects, hand non-tender.  ?  Shoulder: with glenohumeral joint tenderness, without effusion. ?  Upper arm:  without swelling and tenderness ? ? Range of motion: ?  Overall:  Full range of motion of the elbow, full range of motion of wrist and full range of motion in fingers. ?  Shoulder:  right  150 degrees forward flexion; 130 degrees abduction; 20 degrees internal rotation, 20 degrees external rotation, 5 degrees extension, 40 degrees adduction. ? ? Stability: ?  Overall:  Shoulder, elbow and wrist stable ? ? Strength and Tone: ?  Overall full shoulder muscles strength, full upper arm strength and normal upper arm bulk and tone.  ? ?Encounter Diagnosis  ?Name Primary?  ? Chronic pain in right shoulder Yes  ? ?Begin OT. ? ?I will change to diclofenac for anti-inflammatory effects. ? ?Return in three weeks. ? ?Consider MRI if not improved. ? ?Call if any problem. ? ?Precautions discussed. ? ?Electronically Signed ?Darreld Mclean, MD ?4/6/20239:54 AM ? ?

## 2021-10-30 ENCOUNTER — Telehealth: Payer: Self-pay | Admitting: Family Medicine

## 2021-10-30 NOTE — Telephone Encounter (Signed)
PCS FORMS  ? ?Noted ?Copied ?Sleeved  ? ?

## 2021-10-31 ENCOUNTER — Ambulatory Visit (INDEPENDENT_AMBULATORY_CARE_PROVIDER_SITE_OTHER): Payer: Medicare Other | Admitting: Family Medicine

## 2021-10-31 ENCOUNTER — Encounter: Payer: Self-pay | Admitting: Family Medicine

## 2021-10-31 VITALS — BP 132/81 | HR 82 | Resp 16 | Ht 62.0 in | Wt 149.1 lb

## 2021-10-31 DIAGNOSIS — H547 Unspecified visual loss: Secondary | ICD-10-CM | POA: Diagnosis not present

## 2021-10-31 DIAGNOSIS — G40909 Epilepsy, unspecified, not intractable, without status epilepticus: Secondary | ICD-10-CM

## 2021-10-31 DIAGNOSIS — F17200 Nicotine dependence, unspecified, uncomplicated: Secondary | ICD-10-CM | POA: Diagnosis not present

## 2021-10-31 DIAGNOSIS — R6889 Other general symptoms and signs: Secondary | ICD-10-CM

## 2021-10-31 DIAGNOSIS — F322 Major depressive disorder, single episode, severe without psychotic features: Secondary | ICD-10-CM | POA: Diagnosis not present

## 2021-10-31 DIAGNOSIS — G40019 Localization-related (focal) (partial) idiopathic epilepsy and epileptic syndromes with seizures of localized onset, intractable, without status epilepticus: Secondary | ICD-10-CM

## 2021-10-31 MED ORDER — FLUOXETINE HCL 20 MG PO CAPS: 20.0000 mg | ORAL_CAPSULE | Freq: Every day | ORAL | 3 refills | Status: DC

## 2021-10-31 NOTE — Assessment & Plan Note (Signed)
REPORTS PROGRESSIVE VIOSION LOSS, REFER OPTHAL ?

## 2021-10-31 NOTE — Progress Notes (Signed)
? ?  [name]     MRN: [id number]      DOB: [birth year] ? ? ?HPI ?[name] is here to have form completed to validate a need to have someone/ aiassist with cooking, states she forgets food in oven. Also notes no motivation to cleaning her home, and states that she has developed gradual loss of vision in past 6 months ?Reports had seizure 1 week ago, woke with pillow wet, states no blood visible but believes that she did have a seizure, prior to this one documented in 02/2021. ?Has patches and tablets to help with quitting smoking and has called the quit line ?ROS ?Denies recent fever or chills. ?Denies sinus pressure, nasal congestion, ear pain or sore throat. ?Denies chest congestion, productive cough or wheezing. ?Denies chest pains, palpitations and leg swelling ?Denies abdominal pain, nausea, vomiting,diarrhea or constipation.   ?Denies dysuria, frequency, hesitancy or incontinence. ?Denies joint pain, swelling and limitation in mobility. ? ?Denies skin break down or rash. ? ? ?PE ? ?BP 132/81   Pulse 82   Resp 16   Ht 5\' 2"  (1.575 m)   Wt 149 lb 1.9 oz (67.6 kg)   SpO2 98%   BMI 27.27 kg/m?  ? ?Patient alert and oriented and in no cardiopulmonary distress. ? ?HEENT: No facial asymmetry, EOMI,     Neck supple . ? ?Chest: Clear to auscultation bilaterally. ? ?CVS: S1, S2 no murmurs, no S3.Regular rate. ? ?ABD: Soft non tender.  ? ?Ext: No edema ? ?MS: Adequate ROM spine, shoulders, hips and knees. ? ?Skin: Intact, no ulcerations or rash noted. ?normal affect.  ?CNS: CN 2-12 intact,reduced vision on screening power,  normal throughout.no focal deficits noted. ? ? ?Assessment & Plan ? ?Vision loss ?REPORTS PROGRESSIVE VIOSION LOSS, REFER OPTHAL ? ?Depression, major, single episode, severe (HCC) ?Start daily fluoxetine and re eval , not suicidal or homicidal ?Reports increased forgetfulness and lack of motivation to clean home, refer for  CAP ? ?NICOTINE ADDICTION ?Asked:confirms currently smokes  cigarettes ?Assess: Unwilling to set a quit date, but is cutting back ?Advise: needs to QUIT to reduce risk of cancer, cardio and cerebrovascular disease ?Assist: counseled for 5 minutes and literature provided ?Arrange: follow up in 2 to 4 months ? ? ?Localization-related (focal) (partial) idiopathic epilepsy and epileptic syndromes with seizures of localized onset, intractable, without status epilepticus (HCC) ?Reports rcent seizure during sleep, unwitnessed, and cannot verify, prior to this most recent she can recall is approx 8 months prior, requesting CAP ? ?Forgetfulness ?Reports forgettin to turn off stove, needing help with cookojng as a result refer to CAP ? ?

## 2021-10-31 NOTE — Patient Instructions (Signed)
F/u in 8 to 10 weeks re evaluate depression and smoking status ? ?I will complete forms to request 40 hr/ week of assistance in home for you ? ?Please do set and keep quit date for smoking and call 1800quit now regularly ? ?Yoe  are also referred to case manager to see if there are additonal needs that you have to improve the quality of your life ? ?New is once daily fluoxetine for depression, very improtant that you take as prescribed ? ?Thanks for choosing [facility], we consider it a privelige to serve you. ? ?Nurse please screen visoion at checkout, new report of loss of vision ?

## 2021-11-05 NOTE — Chronic Care Management (AMB) (Signed)
?  Care Management  ? ?Note ? ?11/05/2021 ?Name: [name] MRN: [id number] DOB: Jun 25, 1981 ? ?[name] is a 41 y.o. year old female who is a primary care patient of Lodema Hong Milus Mallick, MD. I reached out to [name] by phone today offer care coordination services.  ? ?[name] was given information about care management services today including:  ?Care management services include personalized support from designated clinical staff supervised by her physician, including individualized plan of care and coordination with other care providers ?24/7 contact phone numbers for assistance for urgent and routine care needs. ?The patient may stop care management services at any time by phone call to the office staff. ? ?Patient agreed to services and verbal consent obtained.  ? ?Follow up plan: ?Telephone appointment with care management team member scheduled for:11/29/21 ? ?[name]  ?Care Guide, Embedded Care Coordination ?[place]  Care Management  ?Direct Dial: [phone] ? ?

## 2021-11-15 ENCOUNTER — Ambulatory Visit: Payer: Medicare Other | Admitting: Orthopaedic Surgery

## 2021-11-19 ENCOUNTER — Encounter: Payer: Self-pay | Admitting: Family Medicine

## 2021-11-19 DIAGNOSIS — R6889 Other general symptoms and signs: Secondary | ICD-10-CM | POA: Insufficient documentation

## 2021-11-19 NOTE — Assessment & Plan Note (Signed)
Reports forgettin to turn off stove, needing help with cookojng as a result refer to CAP ?

## 2021-11-19 NOTE — Assessment & Plan Note (Signed)
Asked:confirms currently smokes cigarettes °Assess: Unwilling to set a quit date, but is cutting back °Advise: needs to QUIT to reduce risk of cancer, cardio and cerebrovascular disease °Assist: counseled for 5 minutes and literature provided °Arrange: follow up in 2 to 4 months ° °

## 2021-11-19 NOTE — Assessment & Plan Note (Addendum)
Start daily fluoxetine and re eval , not suicidal or homicidal ?Reports increased forgetfulness and lack of motivation to clean home, refer for  CAP ?

## 2021-11-19 NOTE — Assessment & Plan Note (Signed)
Reports rcent seizure during sleep, unwitnessed, and cannot verify, prior to this most recent she can recall is approx 8 months prior, requesting CAP ?

## 2021-11-23 NOTE — Telephone Encounter (Signed)
Faxed forms [employer] [phone] ?

## 2021-11-29 ENCOUNTER — Ambulatory Visit: Payer: Medicare Other | Admitting: Licensed Clinical Social Worker

## 2021-11-29 DIAGNOSIS — E559 Vitamin D deficiency, unspecified: Secondary | ICD-10-CM

## 2021-11-29 DIAGNOSIS — F17200 Nicotine dependence, unspecified, uncomplicated: Secondary | ICD-10-CM

## 2021-11-29 DIAGNOSIS — G40909 Epilepsy, unspecified, not intractable, without status epilepticus: Secondary | ICD-10-CM

## 2021-11-29 DIAGNOSIS — H547 Unspecified visual loss: Secondary | ICD-10-CM

## 2021-11-29 DIAGNOSIS — R6889 Other general symptoms and signs: Secondary | ICD-10-CM

## 2021-11-29 DIAGNOSIS — F322 Major depressive disorder, single episode, severe without psychotic features: Secondary | ICD-10-CM

## 2021-11-29 NOTE — Chronic Care Management (AMB) (Signed)
Care Management ?Clinical Social Work Note ? ?11/29/2021 ?Name: [name] MRN: [id number] DOB: 10-Dec-1980 ? ?[name] is a 41 y.o. year old female who is a primary care patient of Elizabeth Perches, MD.  The Care Management team was consulted for assistance with chronic disease management and coordination needs. ? ?Engaged with patient by telephone for initial visit in response to provider referral for social work chronic care management and care coordination services ? ?Consent to Services:  ?[name] was given information about Care Management services today including:  ?Care Management services includes personalized support from designated clinical staff supervised by her physician, including individualized plan of care and coordination with other care providers ?24/7 contact phone numbers for assistance for urgent and routine care needs. ?The patient may stop case management services at any time by phone call to the office staff. ? ?Patient agreed to services and consent obtained.  ? ?Assessment: Review of patient past medical history, allergies, medications, and health status, including review of relevant consultants reports was performed today as part of a comprehensive evaluation and provision of chronic care management and care coordination services. ? ?SDOH (Social Determinants of Health) assessments and interventions performed:  ?SDOH Interventions   ? ?Flowsheet Row Most Recent Value  ?SDOH Interventions   ?Physical Activity Interventions Other (Comments)  [walking challenges. has  a cane to use as needed]  ?Stress Interventions Provide Counseling  [client has fear of having seizures,  she has fear of falling]  ?Depression Interventions/Treatment  Counseling, Medication  ? ?  ?  ? ?Advanced Directives Status: See Vynca application for related entries. ? ?Care Plan ? ?Allergies  ?Allergen Reactions  ? Morphine Other (See Comments)  ?  Unknown Reaction  ?unknown  ? ? ?Outpatient Encounter  Medications as of 11/29/2021  ?Medication Sig  ? brivaracetam (BRIVIACT) 100 MG TABS tablet take 1 tablet twice a day  ? diclofenac (VOLTAREN) 75 MG EC tablet Take 1 tablet (75 mg total) by mouth 2 (two) times daily with a meal.  ? ergocalciferol (VITAMIN D2) 1.25 MG (50000 UT) capsule Take 1 capsule (50,000 Units total) by mouth once a week. One capsule once weekly  ? famciclovir (FAMVIR) 250 MG tablet TAKE ONE (1) TABLET BY MOUTH TWICE DAILY  ? famotidine (PEPCID) 20 MG tablet Take 1 tablet (20 mg total) by mouth 2 (two) times daily. (Patient not taking: Reported on 10/31/2021)  ? FLUoxetine (PROZAC) 20 MG capsule Take 1 capsule (20 mg total) by mouth daily.  ? medroxyPROGESTERone (DEPO-PROVERA) 150 MG/ML injection Inject 1 mL (150 mg total) into the muscle every 3 (three) months for 1 dose.  ? nortriptyline (PAMELOR) 25 MG capsule Take 1 capsule (25 mg total) by mouth at bedtime.  ? NURTEC 75 MG TBDP PLACE 1 TABLET ON TONGUE AND ALLOW TO DISSOLVE AS NEEDED FOR MIGRAINE. DO NOT TAKE MORE THAN 1 DOSE IN 24 HOURS  ? oxcarbazepine (TRILEPTAL) 600 MG tablet TAKE 1 TABLET BY MOUTH IN THE MORNING ~TAKE 2 TABLETS AT BEDTIME  ? tiZANidine (ZANAFLEX) 4 MG tablet Take 1 tablet (4 mg total) by mouth every 6 (six) hours as needed for muscle spasms.  ? zonisamide (ZONEGRAN) 100 MG capsule Take 1 cap in AM, 5 caps in PM  ? ?No facility-administered encounter medications on file as of 11/29/2021.  ? ? ?Patient Active Problem List  ? Diagnosis Date Noted  ? Forgetfulness 11/19/2021  ? Right shoulder pain 08/10/2021  ? Vision loss 05/08/2021  ? Post  traumatic stress disorder (PTSD) 04/28/2020  ? Post-traumatic stress 04/27/2020  ? Depression, major, single episode, severe (HCC) 04/27/2020  ? PCR DNA positive for HSV2 07/25/2018  ? Localization-related (focal) (partial) idiopathic epilepsy and epileptic syndromes with seizures of localized onset, intractable, without status epilepticus (HCC) 11/17/2017  ? Seizure (HCC) 11/19/2015  ?  Amenorrhea 09/28/2011  ? Vitamin D deficiency 09/06/2011  ? Headache 03/10/2011  ? Convulsions (HCC) 10/26/2008  ? NICOTINE ADDICTION 12/10/2007  ? Depression 12/10/2007  ? ? ?Conditions to be addressed/monitored: monitor client management of depression issues ? ?Care Plan : LCSW Care plan  ?Updates made by Isaiah Blakes, LCSW since 11/29/2021 12:00 AM  ?  ? ?Problem: Depression Identification (Depression)   ?  ? ?Goal: Depressive Symptoms Identified. Manage Depression issues   ?Start Date: 11/29/2021  ?Expected End Date: 02/14/2022  ?This Visit's Progress: Not on track  ?Priority: Medium  ?Note:   ?Current Barriers:  ? ?Seizures occasionally ?Mobility issues ?Depression issues ?Suicidal Ideation/Homicidal Ideation: No ? ?Clinical Social Work Goal(s):  ?patient will work with SW monthly by telephone or in person to reduce or manage symptoms related to depression and depression management ?Patient will attend scheduled medical appointments in next 30 days ?Patient will contact RNCM as needed for nursing support ? ?Interventions: ?Patient interviewed and appropriate assessments performed: GAD-7; PHQ 2/9 ?1:1 collaboration with Elizabeth Perches, MD regarding development and update of comprehensive plan of care as evidenced by provider attestation and co-signature ?Discussed client needs with Kelli Churn. [place] ?Reviewed transport needs. She has help from her mother with her transport needs ?Reviewed sleeping issues. She has some difficulty sleeping ?Discussed food procurement. She said she has [employer] benefit and this helps with food procurement. She drinks about 2 cans of ensure supplement daily  ?Discussed medication procurement ?Reviewed client disability status. She is disabled and receives a monthly Disability benefit ?Reviewed pain issues . She has had in the past some pain in her right shoulder. She has seen Dr. Hilda Lias, orthopedist for this issue ?Reviewed mood of client. She is sad occasionally She is  taking medications as prescribed. She is concerned about having seizures.  She has some support from her mother and has some support from her daughter ?Reviewed upcoming medical appointment She has an appointment on 12/06/2021 at [facility] ? ?Patient Coping Strengths:  ?Takes medications as prescribed ?Attends scheduled medical appointments ? ?Patient Self Care Deficits:  ?Memory challenges ?Mobility challenges ?Depression issues ? ?Patient Goals:  ?- spend time or talk with others at least 2 to 3 times per week ?- practice relaxation or meditation daily ?- keep a calendar with appointment dates ? ?Follow Up Plan: LCSW to call client on 01/10/22 at 1:00 PM   ? ?Kelton Pillar.Donavan Kerlin MSW, LCSW ?Licensed Clinical Social Worker ?[facility] Care Management ?[phone] ?

## 2021-11-29 NOTE — Patient Instructions (Addendum)
Visit Information ? ?Thank you for taking time to visit with me today. Please don't hesitate to contact me if I can be of assistance to you before our next scheduled telephone appointment. ? ?Following are the goals we discussed today:  ? ?Our next appointment is by telephone on 01/10/22 at 1:00 PM  ? ?Please call the care guide team at [phone] if you need to cancel or reschedule your appointment.  ? ?If you are experiencing a Mental Health or Behavioral Health Crisis or need someone to talk to, please call the [facility]: [phone]  ? ?Following is a copy of your full plan of care:  ?Care Plan : LCSW Care plan  ?Updates made by Elizabeth Blakes, LCSW since 11/29/2021 12:00 AM  ?  ? ?Problem: Depression Identification (Depression)   ?  ? ?Goal: Depressive Symptoms Identified. Manage Depression issues   ?Start Date: 11/29/2021  ?Expected End Date: 02/14/2022  ?This Visit's Progress: Not on track  ?Priority: Medium  ?Note:   ?Current Barriers:  ? ?Seizures occasionally ?Mobility issues ?Depression issues ?Suicidal Ideation/Homicidal Ideation: No ? ?Clinical Social Work Goal(s):  ?patient will work with SW monthly by telephone or in person to reduce or manage symptoms related to depression and depression management ?Patient will attend scheduled medical appointments in next 30 days ?Patient will contact RNCM as needed for nursing support ? ?Interventions: ?Patient interviewed and appropriate assessments performed: GAD-7; PHQ 2/9 ?1:1 collaboration with Elizabeth Perches, MD regarding development and update of comprehensive plan of care as evidenced by provider attestation and co-signature ?Discussed client needs with [name] ?Reviewed transport needs. She has help from her mother with her transport needs ?Reviewed sleeping issues. She has some difficulty sleeping ?Discussed food procurement. She said she has [employer] benefit and this helps with food procurement. She drinks about  2 cans of ensure supplement daily  ?Discussed medication procurement ?Reviewed client disability status. She is disabled and receives a monthly Disability benefit ?Reviewed pain issues . She has had in the past some pain in her right shoulder. She has seen Dr. Hilda Levine, orthopedist for this issue ?Reviewed mood of client. She is sad occasionally She is taking medications as prescribed. She is concerned about having seizures.  She has some support from her mother and has some support from her daughter ?Reviewed upcoming medical appointment She has an appointment on [facility]06[facility] at [facility] ? ? ?Patient Coping Strengths:  ?Takes medications as prescribed ?Attends scheduled medical appointments ? ?Patient Self Care Deficits:  ?Memory challenges ?Mobility challenges ?Depression issues ? ?Patient Goals:  ?- spend time or talk with others at least 2 to 3 times per week ?- practice relaxation or meditation dailyVisit Information ? ?[name] was given information about Care Management services by the embedded care coordination team including:  ?Care Management services include personalized support from designated clinical staff supervised by her physician, including individualized plan of care and coordination with other care providers ?24/7 contact phone numbers for assistance for urgent and routine care needs. ?The patient may stop CCM services at any time (effectivat the end of the month) by phone call to the office staff. ? ?Patient agreed to services and verbal consent obtained.  ? ?Follow Up Plan: LCSW to call client on 01/10/22 at 1:00 PM  ?  ?Elizabeth Pillar.Dehaven Levine MSW, LCSW ?Licensed Clinical Social Worker ?[facility] Care Management ?[phone] ? ?  ?

## 2021-11-30 ENCOUNTER — Ambulatory Visit (HOSPITAL_COMMUNITY): Payer: Medicare Other

## 2021-11-30 ENCOUNTER — Other Ambulatory Visit (HOSPITAL_COMMUNITY): Payer: Self-pay | Admitting: Family Medicine

## 2021-11-30 DIAGNOSIS — Z1231 Encounter for screening mammogram for malignant neoplasm of breast: Secondary | ICD-10-CM

## 2021-12-04 ENCOUNTER — Other Ambulatory Visit: Payer: Self-pay | Admitting: Neurology

## 2021-12-20 ENCOUNTER — Telehealth: Payer: Self-pay | Admitting: *Deleted

## 2021-12-20 ENCOUNTER — Telehealth: Payer: Medicare Other

## 2021-12-20 NOTE — Telephone Encounter (Signed)
  Care Management   Follow Up Note   12/20/2021 Name: [name] MRN: [id number] DOB: 25-Jul-1980   Referred by: Kerri Perches, MD Reason for referral : Care Coordination (Seizures)   An unsuccessful telephone outreach was attempted today. The patient was referred to the case management team for assistance with care management and care coordination.   Follow Up Plan: Telephone follow up appointment with care management team member scheduled for: upon care guide rescheduling.  [name] Shows [facility], BSN RN Case Manager [facility] [phone]

## 2021-12-28 ENCOUNTER — Ambulatory Visit (INDEPENDENT_AMBULATORY_CARE_PROVIDER_SITE_OTHER): Payer: Medicare Other | Admitting: *Deleted

## 2021-12-28 DIAGNOSIS — Z3042 Encounter for surveillance of injectable contraceptive: Secondary | ICD-10-CM

## 2021-12-28 MED ORDER — MEDROXYPROGESTERONE ACETATE 150 MG/ML IM SUSP
150.0000 mg | Freq: Once | INTRAMUSCULAR | Status: AC
  Administered 2021-12-28: 150 mg via INTRAMUSCULAR

## 2021-12-28 NOTE — Progress Notes (Signed)
   NURSE VISIT- INJECTION  SUBJECTIVE:  [name] is a 41 y.o. G3P3 female here for a Depo Provera for contraception/period management. She is a GYN patient.   OBJECTIVE:  There were no vitals taken for this visit.  Appears well, in no apparent distress  Injection administered in: Right deltoid  Meds ordered this encounter  Medications   medroxyPROGESTERone (DEPO-PROVERA) injection 150 mg    ASSESSMENT: GYN patient Depo Provera for contraception/period management PLAN: Follow-up: in 11-13 weeks for next Depo   [name]  12/28/2021 11:30 AM

## 2021-12-31 ENCOUNTER — Inpatient Hospital Stay (HOSPITAL_COMMUNITY): Admission: RE | Admit: 2021-12-31 | Payer: Medicare Other | Source: Ambulatory Visit

## 2022-01-01 DIAGNOSIS — F17200 Nicotine dependence, unspecified, uncomplicated: Secondary | ICD-10-CM | POA: Diagnosis not present

## 2022-01-01 DIAGNOSIS — Z79899 Other long term (current) drug therapy: Secondary | ICD-10-CM | POA: Diagnosis not present

## 2022-01-01 DIAGNOSIS — R569 Unspecified convulsions: Secondary | ICD-10-CM | POA: Diagnosis not present

## 2022-01-01 DIAGNOSIS — Z885 Allergy status to narcotic agent status: Secondary | ICD-10-CM | POA: Diagnosis not present

## 2022-01-02 ENCOUNTER — Ambulatory Visit: Payer: Medicare Other | Admitting: Family Medicine

## 2022-01-10 ENCOUNTER — Telehealth: Payer: Self-pay

## 2022-01-10 ENCOUNTER — Ambulatory Visit: Payer: Medicare Other | Admitting: Neurology

## 2022-01-10 ENCOUNTER — Telehealth: Payer: Medicare Other

## 2022-01-10 NOTE — Chronic Care Management (AMB) (Signed)
  Care Coordination Note  01/10/2022 Name: [name] MRN: [id number] DOB: [birth year]  [name] is a 41 y.o. year old female who is a primary care patient of Kerri Perches, MD and is actively engaged with the care management team. I reached out to [name] by phone today to assist with re-scheduling an initial visit with the RN Case Manager  Follow up plan: Unsuccessful telephone outreach attempt made. A HIPAA compliant phone message was left for the patient providing contact information and requesting a return call.  The care management team will reach out to the patient again over the next 7 days.  If patient returns call to provider office, please advise to call Embedded Care Management Care Guide Penne Lash  at [phone]  Penne Lash, RMA Care Guide, Embedded Care Coordination [facility]  [place], [place] [address] Direct Dial: [phone] [email or web address]

## 2022-01-11 ENCOUNTER — Encounter: Payer: Self-pay | Admitting: Nurse Practitioner

## 2022-01-11 ENCOUNTER — Telehealth: Payer: Medicare Other

## 2022-01-11 ENCOUNTER — Telehealth: Payer: Self-pay | Admitting: Neurology

## 2022-01-11 ENCOUNTER — Ambulatory Visit (INDEPENDENT_AMBULATORY_CARE_PROVIDER_SITE_OTHER): Payer: Medicare Other | Admitting: Nurse Practitioner

## 2022-01-11 ENCOUNTER — Telehealth: Payer: Self-pay | Admitting: *Deleted

## 2022-01-11 VITALS — BP 117/79 | HR 86 | Ht 61.0 in | Wt 148.0 lb

## 2022-01-11 DIAGNOSIS — M25511 Pain in right shoulder: Secondary | ICD-10-CM | POA: Diagnosis not present

## 2022-01-11 DIAGNOSIS — Z7689 Persons encountering health services in other specified circumstances: Secondary | ICD-10-CM

## 2022-01-11 DIAGNOSIS — F324 Major depressive disorder, single episode, in partial remission: Secondary | ICD-10-CM

## 2022-01-11 DIAGNOSIS — F17200 Nicotine dependence, unspecified, uncomplicated: Secondary | ICD-10-CM

## 2022-01-11 DIAGNOSIS — G40019 Localization-related (focal) (partial) idiopathic epilepsy and epileptic syndromes with seizures of localized onset, intractable, without status epilepticus: Secondary | ICD-10-CM | POA: Diagnosis not present

## 2022-01-11 DIAGNOSIS — G8929 Other chronic pain: Secondary | ICD-10-CM

## 2022-01-11 DIAGNOSIS — F322 Major depressive disorder, single episode, severe without psychotic features: Secondary | ICD-10-CM

## 2022-01-11 MED ORDER — FLUOXETINE HCL 20 MG PO CAPS: 20.0000 mg | ORAL_CAPSULE | Freq: Every day | ORAL | 3 refills | Status: DC

## 2022-01-11 NOTE — Assessment & Plan Note (Deleted)
States that she did not not get the RX for prozac , med resent to pharmacy today denies SIO, HI  Phq9 score 0.

## 2022-01-12 DIAGNOSIS — Z7689 Persons encountering health services in other specified circumstances: Secondary | ICD-10-CM | POA: Insufficient documentation

## 2022-01-12 NOTE — Assessment & Plan Note (Signed)
follow up for epilepsy monitoring at [facility] on 01/01/2022 to 01/10/2022.  Patient stated that she had 2 seizures while she was at the hospital.  She said she was taken off  all of her medications to see how many seizures she would have while not taking medication.  Patient stated that she has been taking her medications as ordered since she left the hospital.  She denies seizures, headache, dizziness, nausea, vomiting. Hospital discharge summary and notes and labs reviewed by me  Patient encouraged to maintain close follow-up with neurologist.

## 2022-01-12 NOTE — Assessment & Plan Note (Addendum)
Had epilepsy monitoring at [facility] on 01/01/2022 to 01/10/2022.  Patient stated that she had 2 seizures while she was at the hospital.  She said she was taken off  all of her seizures  medications to see how many seizures she would have while not taking medication.   Denies seizure since she left the hospital. She has since resumes taking her medications.  Currently on Briviact 100 mg twice daily, Trileptal 600 in the morning, 1200 mg at bedtime, zonisamide 500 mg daily at bedtime Continue current medications Need to maintain close follow-up with neurology discussed with patient

## 2022-01-15 NOTE — Chronic Care Management (AMB) (Signed)
  Care Coordination Note  01/15/2022 Name: [name] MRN: [id number] DOB: 1980/12/07  [name] is a 41 y.o. year old female who is a primary care patient of Kerri Perches, MD and is actively engaged with the care management team. I reached out to [name] by phone today to assist with re-scheduling an initial visit with the RN Case Manager  Follow up plan: Unsuccessful telephone outreach attempt made. A HIPAA compliant phone message was left for the patient providing contact information and requesting a return call.  The care management team will reach out to the patient again over the next 5 days.  If patient returns call to provider office, please advise to call Embedded Care Management Care Guide Penne Lash  at [phone]  Penne Lash, RMA Care Guide, Embedded Care Coordination [facility]  [place], [place] [address] Direct Dial: [phone] [email or web address]

## 2022-01-18 NOTE — Chronic Care Management (AMB) (Signed)
  Care Coordination Note  01/18/2022 Name: FLORETTA PETRO MRN: [id number] DOB: August 19, 1980  SUSANA DUELL is a 41 y.o. year old female who is a primary care patient of Kerri Perches, MD and is actively engaged with the care management team. I reached out to [name] by phone today to assist with re-scheduling an initial visit with the RN Case Manager  Follow up plan: Unable to make contact on outreach attempts x 3. PCP Kerri Perches, MD notified via routed documentation in medical record.   Penne Lash, RMA Care Guide, Embedded Care Coordination [facility]  [place], [place] [address] Direct Dial: [phone] [email or web address]: [email or web address]

## 2022-01-25 ENCOUNTER — Ambulatory Visit (INDEPENDENT_AMBULATORY_CARE_PROVIDER_SITE_OTHER): Payer: 59 | Admitting: Family Medicine

## 2022-01-25 ENCOUNTER — Encounter: Payer: Self-pay | Admitting: Family Medicine

## 2022-01-25 VITALS — BP 126/86 | HR 76 | Ht 62.0 in | Wt 150.1 lb

## 2022-01-25 DIAGNOSIS — F322 Major depressive disorder, single episode, severe without psychotic features: Secondary | ICD-10-CM

## 2022-01-25 NOTE — Patient Instructions (Signed)
Annual exam 10/25 or after , call if you need me sooner

## 2022-01-25 NOTE — Progress Notes (Signed)
Pt left without being seen.

## 2022-01-30 NOTE — Telephone Encounter (Signed)
Pls let her know I got the records of her hospitalization at [facility], but they will be presenting her case to the Epilepsy team to come up with a plan for her. When is her appt with [facility]?

## 2022-01-30 NOTE — Telephone Encounter (Signed)
Pt stated she isn't sure when her next appointment is she will call them to find out and let us know

## 2022-02-04 ENCOUNTER — Ambulatory Visit (INDEPENDENT_AMBULATORY_CARE_PROVIDER_SITE_OTHER): Payer: 59

## 2022-02-04 DIAGNOSIS — Z0000 Encounter for general adult medical examination without abnormal findings: Secondary | ICD-10-CM

## 2022-02-14 ENCOUNTER — Telehealth (INDEPENDENT_AMBULATORY_CARE_PROVIDER_SITE_OTHER): Payer: 59 | Admitting: Neurology

## 2022-02-14 ENCOUNTER — Ambulatory Visit: Payer: Medicare Other | Admitting: Family Medicine

## 2022-02-14 ENCOUNTER — Encounter: Payer: Self-pay | Admitting: Neurology

## 2022-02-14 VITALS — Ht 62.0 in | Wt 150.0 lb

## 2022-02-14 DIAGNOSIS — G43009 Migraine without aura, not intractable, without status migrainosus: Secondary | ICD-10-CM

## 2022-02-14 DIAGNOSIS — G40019 Localization-related (focal) (partial) idiopathic epilepsy and epileptic syndromes with seizures of localized onset, intractable, without status epilepticus: Secondary | ICD-10-CM | POA: Diagnosis not present

## 2022-02-14 MED ORDER — UBRELVY 100 MG PO TABS: ORAL_TABLET | ORAL | 5 refills | Status: DC

## 2022-02-14 MED ORDER — ZONISAMIDE 100 MG PO CAPS
ORAL_CAPSULE | ORAL | 11 refills | Status: DC
Start: 2022-02-14 — End: 2022-09-17

## 2022-02-14 MED ORDER — OXCARBAZEPINE 600 MG PO TABS
ORAL_TABLET | ORAL | 11 refills | Status: DC
Start: 2022-02-14 — End: 2022-08-28

## 2022-02-14 MED ORDER — TIZANIDINE HCL 4 MG PO TABS
4.0000 mg | ORAL_TABLET | Freq: Four times a day (QID) | ORAL | 11 refills | Status: DC | PRN
Start: 2022-02-14 — End: 2022-11-25

## 2022-02-14 MED ORDER — BRIVARACETAM 100 MG PO TABS: ORAL_TABLET | ORAL | 5 refills | Status: DC

## 2022-02-14 MED ORDER — NORTRIPTYLINE HCL 25 MG PO CAPS: ORAL_CAPSULE | ORAL | 11 refills | Status: DC

## 2022-02-14 NOTE — Progress Notes (Signed)
Virtual Visit via Video Note The purpose of this virtual visit is to provide medical care while limiting exposure to the novel coronavirus.    Consent was obtained for video visit:  Yes.   Answered questions that patient had about telehealth interaction:  Yes.   I discussed the limitations, risks, security and privacy concerns of performing an evaluation and management service by telemedicine. I also discussed with the patient that there may be a patient responsible charge related to this service. The patient expressed understanding and agreed to proceed.  Pt location: Home Physician Location: office Name of referring provider:  Kerri Perches, MD I connected with [name] at patients initiation/request on 02/14/2022 at 10:30 AM EDT by video enabled telemedicine application and verified that I am speaking with the correct person using two identifiers. Pt MRN:  [id number] Pt DOB:  1980-11-07 Video Participants:  [name]   History of Present Illness:  The patient had a virtual video visit on 02/14/2022. She was last seen in the neurology clinic 5 months ago for intractable epilepsy. Since her last visit, she underwent video EEG monitoring at [facility] from June 13-22, 2023. Notes were reviewed. Interictal EEG showed left temporal slowing, occasional spike and slow wave discharges, generalized but left fronto-central predominant, lasting 1-3 seconds during sleep. There were 2 seizures captured, one in sleep with staring, mouth automatisms, head turn to right, then GTC, and another in sleep with ictal cry and rightward head turn then GTC, with left temporal focal onset. Per discharge summary, she will likely need to be readmitted for further capturing of more seizures prior to being considered for phase II monitoring. She was discharged home on Briviact 100mg  BID, Oxcarbazepine 600mg  1 tab in AM, 2 tabs in PM, Zonisamide 100mg  in AM, 500mg  qhs, and nortriptyline 25mg  qhs.  She denies  any seizures since hospital discharge, but notes an average seizure frequency of 2 a month. Headaches are still bad, the Nurtec helps a little, she takes it 2-3 times a month but does not like the taste of the dissolving tablet. She is on nortriptyline 25mg  qhs for headache prophylaxis. She reports pain in her right shoulder. She does not drive. She is asking for a home health aide, she needs assistance with dressing and bathing, she has left the stove on and needs help with meals. She also has stairs at her home. She forgets her medications and also requests for medication assistance.    History from Initial Assessment 11/14/2017: This is a 41 year old right-handed woman with a history of migraines, back pain, and seizures, presenting to establish care. Seizures started around 2010 when she started having nocturnal convulsions with tongue bite and urinary incontinence. She would wake up with her children telling her what happened. She would be slightly confused but back to baseline quickly. She was on Keppra and Lamictal with no change in seizure frequency. At that time she was also drinking heavily, but even with decrease in alcohol intake, had not noticed any change in frequency. She was admitted to [facility] for 7 days in October 2012, typical episodes were not captured. EMU report unavailable for review, on discharge summary it was noted there were incidental left-sided sharp waves of which there may or ma not be a correlation to her spells. Keppra stopped and Lamictal dose increased. She was seeing neurologist Dr. , records unavailable for review, but it appears Fycompa was added to Lamictal. She reported worsening of seizures, now occurring  in wakefulness, and was admitted for another 7-day EMU stay at [facility] in January 2018, again typical events were not captured. Baseline EEG was abnormal with intermittent left anterior to midtemporal slow waves, sharply contoured bursts of theta slowing over  the left temporal region, as well as intermittent right temporal theta and delta slowing. There were also frequent 3-4 Hz very brief intermediate duration fluctuating runs of rhythmic delta activities (LRDA) maximal at F7. Occasional very brief runs were also seen maximal at F8.There were occasional independent left temporal and right temporal sharp waves. At times left temporal discharges have a wide field. There are ER visits in April, May, October, November 2018 for seizures, medications listed included Vimpat, Fycompa, and Oxtellar. Her last ER visit was on 11/05/17, taking Oxtellar 1200mg  BID without side effects. She reports compliance to medication.   She brings a calendar of her seizures in 2019. She had 2 in January, 2 in February, 1 in March, 3 so far in April. The last seizure occurred on 11/11/17 out of sleep, she woke up with a lip bite. She has had a lot of her seizures occur while playing at the [facility]. Family has witnessed the seizures, describing staring/unresponsiveness with lip smacking, then convulsions with head turn to the right. She denies any prior warning symptoms, no post-ictal focal weakness. Her entire body feels sore. No clear seizure triggers, she denies any sleep deprivation or missed medications. She has significantly cut down on alcohol, drinking 1 beer on the weekend.    She denies any olfactory/gustatory hallucinations, deja vu, rising epigastric sensation, focal numbness/tingling/weakness, myoclonic jerks. She has migraines after a seizure and had a prescription for Norco. She also has Zipsor and Robaxin to take after seizures. She denies any dizziness, diplopia, dysarthria/dysphagia, neck/back pain, bowel/bladder dysfunction.    Epilepsy Risk Factors:  Her brother had seizures in childhood. She had a normal birth and early development.  There is no history of febrile convulsions, CNS infections such as meningitis/encephalitis, significant traumatic brain injury,  neurosurgical procedures, or family history of seizures. She has a scar over the left temporal region after hitting her head on a window, she denies any loss of consciousness with this.    Prior AEDs: Lamictal, Keppra, Fycompa, Vimpat, Depakote, Cenobamate Prior migraine rescue medications: sumatriptan, rizatriptan, naratriptan, Migranal nasal spray, Ubrelvy, Fioricet, Robaxin, flexeril, ibuprofen, naproxen, diclofenac.   Laboratory Data:  EEGs: Her EEG at [facility] had shown bilateral temporal slowing, as well as rhythmic delta at F7 and F8, independent left and right temporal sharp waves. MRI: MRI brain with and without contrast in 10/2008 was unremarkable with scatted white matter changes I personally reviewed MRI brain with and without contrast done 11/17/17 which did not show any acute changes, hippocampi symmetric with no abnormal signal or enhancement seen. There were several white matter changes bilaterally with no abnormal enhancement.  Records from [facility] reviewed: She had a PET scan in 01/2020: no seizure focus identified  Video EEG (02/17/2020-02/23/2020) : She had one GTC with R-head deviation (appeared to be L-sided onset) and another episode with grunting and unclear shaking (pt off camera) with postictal agiatation, but no staring spells with oral and hand automatisms. Interictal EEG showed an abnormal EEG due to occasional left temporoparietal and parietal sharp waves.  Neuropsychological evaluation in 12/2019: Abnormal but consistent with expectation given her overall intellectual functioning and limited educational attainment. She does have slightly better discrimination or recognition of visual versus verbal material. However, overall, there is not compelling evidence of  lateralized dysfunction. Given her memory dysfunction, results suggest that she would be at a low risk of further memory impairment should she undergo temporal resection. There are no mood or psychiatric concerns of note and  she has strong family support.    Oxcarbazepine level 03/2020: 36 (ref 10-35) Zonisamide level 03/2020: 24.2 (ref 10-40)    Current Outpatient Medications on File Prior to Visit  Medication Sig Dispense Refill   brivaracetam (BRIVIACT) 100 MG TABS tablet take 1 tablet twice a day 60 tablet 5   diclofenac (VOLTAREN) 75 MG EC tablet Take 1 tablet (75 mg total) by mouth 2 (two) times daily with a meal. 60 tablet 2   famciclovir (FAMVIR) 250 MG tablet TAKE ONE (1) TABLET BY MOUTH TWICE DAILY 28 tablet 10   famotidine (PEPCID) 20 MG tablet Take 1 tablet (20 mg total) by mouth 2 (two) times daily. 20 tablet 0   FLUoxetine (PROZAC) 20 MG capsule Take 1 capsule (20 mg total) by mouth daily. 30 capsule 3   medroxyPROGESTERone (DEPO-PROVERA) 150 MG/ML injection Inject 1 mL (150 mg total) into the muscle every 3 (three) months for 1 dose. 1 mL 4   nortriptyline (PAMELOR) 25 MG capsule Take 1 capsule (25 mg total) by mouth at bedtime. 30 capsule 11   NURTEC 75 MG TBDP PLACE 1 TABLET ON TONGUE AND ALLOW TO DISSOLVE AS NEEDED FOR MIGRAINE. DO NOT TAKE MORE THAN 1 DOSE IN 24 HOURS 10 tablet 10   oxcarbazepine (TRILEPTAL) 600 MG tablet TAKE 1 TABLET BY MOUTH IN THE MORNING ~TAKE 2 TABLETS AT BEDTIME 90 tablet 11   tiZANidine (ZANAFLEX) 4 MG tablet TAKE 1 TABLET BY MOUTH EVERY 6 HOURS AS NEEDED FOR MUSCLE SPASMS 30 tablet 10   zonisamide (ZONEGRAN) 100 MG capsule Take 1 cap in AM, 5 caps in PM 180 capsule 11   ergocalciferol (VITAMIN D2) 1.25 MG (50000 UT) capsule Take 1 capsule (50,000 Units total) by mouth once a week. One capsule once weekly (Patient not taking: Reported on 01/11/2022) 12 capsule 1   No current facility-administered medications on file prior to visit.     Observations/Objective:   GEN:  The patient appears stated age and is in NAD.  Neurological examination: Patient is awake, alert. No aphasia or dysarthria. Intact fluency and comprehension. Cranial nerves: Extraocular movements intact.  No facial asymmetry. Motor: moves all extremities symmetrically, at least anti-gravity x 4.    Assessment and Plan:   This is a 41 yo RH woman with a history of migraines and intractable epilepsy.  Semiology suggestive of focal to bilateral tonic-clonic epilepsy arising from the left temporal region (staring/unresponsive with lip smacking followed by convulsion with head version to the right per family). She has had 2 Phase I presurgical evaluations at [facility] with vEEG capturing seizures that appeared to be left-sided onset, interictal EEG with left temporoparietal and parietal sharp waves, PET scan no seizure focus identified. Per discharge summary, she will likely need to be readmitted for further capturing of more seizures prior to being considered for phase II monitoring. Continue  Briviact 100mg  BID, Oxcarbazepine 600mg  1 tab in AM, 2 tabs in PM, Zonisamide 100mg  in AM, 500mg  qhs. Follow-up as scheduled with [facility] for next steps in management. She continues to report frequent headaches, increase nortriptyline 25mg : take 2 caps every night. She has side effects on Nurtec and will be prescribed Ubrelvy for migraine prophylaxis. She does not drive. Follow-up in 3-4 months, call for any changes.  Follow Up Instructions:   -I discussed the assessment and treatment plan with the patient. The patient was provided an opportunity to ask questions and all were answered. The patient agreed with the plan and demonstrated an understanding of the instructions.   The patient was advised to call back or seek an in-person evaluation if the symptoms worsen or if the condition fails to improve as anticipated.     Van Clines, MD

## 2022-02-18 NOTE — Patient Instructions (Signed)
Good to see you.  Increase nortriptyline 25mg : take 2 capsules every night  2. A prescription for was sent for migraine rescue.  3. Continue all your seizure medications and follow-up with [facility] as scheduled  4. Follow-up in 3-4 months, call for any changes   Seizure Precautions: 1. If medication has been prescribed for you to prevent seizures, take it exactly as directed.  Do not stop taking the medicine without talking to your doctor first, even if you have not had a seizure in a long time.   2. Avoid activities in which a seizure would cause danger to yourself or to others.  Don't operate dangerous machinery, swim alone, or climb in high or dangerous places, such as on ladders, roofs, or girders.  Do not drive unless your doctor says you may.  3. If you have any warning that you may have a seizure, lay down in a safe place where you can't hurt yourself.    4.  No driving for 6 months from last seizure, as per [facility].   Please refer to the following link on the Epilepsy Foundation of America's website for more information: http://www.epilepsyfoundation.org/answerplace/Social/driving/drivingu.cfm   5.  Maintain good sleep hygiene. Avoid alcohol.  6.  Notify your neurology if you are planning pregnancy or if you become pregnant.  7.  Contact your doctor if you have any problems that may be related to the medicine you are taking.  8.  Call 911 and bring the patient back to the ED if:        A.  The seizure lasts longer than 5 minutes.       B.  The patient doesn't awaken shortly after the seizure  C.  The patient has new problems such as difficulty seeing, speaking or moving  D.  The patient was injured during the seizure  E.  The patient has a temperature over 102 F (39C)  F.  The patient vomited and now is having trouble breathing

## 2022-02-26 ENCOUNTER — Other Ambulatory Visit: Payer: Self-pay | Admitting: Neurology

## 2022-03-04 ENCOUNTER — Other Ambulatory Visit: Payer: Self-pay | Admitting: Neurology

## 2022-03-07 ENCOUNTER — Telehealth: Payer: Self-pay | Admitting: Neurology

## 2022-03-07 ENCOUNTER — Telehealth: Payer: Self-pay

## 2022-03-07 NOTE — Telephone Encounter (Signed)
Called patient and helped her understand the medication. She was getting one in a package from a mail delivery and then had a bottle of med's from [employer]. I helped her understand how to take it.

## 2022-03-07 NOTE — Telephone Encounter (Signed)
Per Dr. Rosalyn Gess last note, she should be on nortriptyline 25mg  at bedtime.  She also recommended home health referral - please follow-up on this, and resend, if needed.

## 2022-03-07 NOTE — Telephone Encounter (Signed)
Per Dr. Rosalyn Gess last note, she did want to send referral for home health (see note).

## 2022-03-07 NOTE — Telephone Encounter (Signed)
Caller LM with [name], that she was expecting to be mailed orders so that she can get a home health aid. She also has questions about nortriptyline 25mg . She is unsure which one she should take. One bottle is for night and another pill bottle says to take 2 at [facility].

## 2022-03-08 ENCOUNTER — Other Ambulatory Visit: Payer: Self-pay

## 2022-03-08 DIAGNOSIS — R6889 Other general symptoms and signs: Secondary | ICD-10-CM

## 2022-03-08 DIAGNOSIS — H547 Unspecified visual loss: Secondary | ICD-10-CM

## 2022-03-08 DIAGNOSIS — Z7689 Persons encountering health services in other specified circumstances: Secondary | ICD-10-CM

## 2022-03-08 DIAGNOSIS — G40019 Localization-related (focal) (partial) idiopathic epilepsy and epileptic syndromes with seizures of localized onset, intractable, without status epilepticus: Secondary | ICD-10-CM

## 2022-03-08 NOTE — Telephone Encounter (Signed)
Orders sent to [facility] health

## 2022-03-08 NOTE — Progress Notes (Signed)
Home h

## 2022-03-10 ENCOUNTER — Other Ambulatory Visit: Payer: Self-pay | Admitting: Neurology

## 2022-03-15 ENCOUNTER — Ambulatory Visit: Payer: Medicaid Other

## 2022-03-17 ENCOUNTER — Other Ambulatory Visit: Payer: Self-pay | Admitting: Neurology

## 2022-03-19 ENCOUNTER — Ambulatory Visit: Payer: Medicare Other | Admitting: Family Medicine

## 2022-03-22 ENCOUNTER — Telehealth: Payer: Self-pay | Admitting: Neurology

## 2022-03-22 ENCOUNTER — Other Ambulatory Visit: Payer: Self-pay | Admitting: Neurology

## 2022-03-22 MED ORDER — BRIVARACETAM 100 MG PO TABS: 100.0000 mg | ORAL_TABLET | Freq: Two times a day (BID) | ORAL | 5 refills | Status: DC

## 2022-03-22 NOTE — Telephone Encounter (Signed)
1. Which medications need refilled? (List name and dosage, if known) briviact  2. Which pharmacy/location is medication to be sent to? (include street and city if Insurance claims handler) [facility]

## 2022-03-22 NOTE — Addendum Note (Signed)
Addended by: Van Clines on: 03/22/2022 02:48 PM   Modules accepted: Orders

## 2022-03-24 ENCOUNTER — Other Ambulatory Visit: Payer: Self-pay | Admitting: Neurology

## 2022-04-10 ENCOUNTER — Telehealth: Payer: Self-pay

## 2022-04-10 ENCOUNTER — Other Ambulatory Visit (HOSPITAL_COMMUNITY): Payer: Self-pay

## 2022-04-10 NOTE — Telephone Encounter (Signed)
Received request to initiate PA for Ubrelvy 100mg .  Submitted a Prior Authorization request to [facility] for  UBRELVY  via CoverMyMeds. Will update once we receive a response.   Key: [id number]

## 2022-04-10 NOTE — Telephone Encounter (Signed)
Received notification from [facility] regarding a prior authorization for  Ubrelvy . Authorization has been APPROVED from 04/10/2022 to 07/21/2022. Approval letter sent to scan center.  Authorization # [id number]  Could not run test claim to determine pt's copay as it has already been billed at an external pharmacy. Pt is ineligible for copay card due to having coverage through Medicare.

## 2022-04-15 ENCOUNTER — Telehealth: Payer: Self-pay | Admitting: Neurology

## 2022-04-15 NOTE — Telephone Encounter (Signed)
Pt said she keeps falling down the steps that is around/in her home. She needs a note from [place] stating she needs to live in an apartment where there are no stairs. She spoke with the apartment complex and they told her she will need a note from [place] stating that.

## 2022-04-22 ENCOUNTER — Telehealth: Payer: Self-pay

## 2022-04-22 ENCOUNTER — Telehealth: Payer: Self-pay | Admitting: Family Medicine

## 2022-04-22 ENCOUNTER — Encounter: Payer: Self-pay | Admitting: Neurology

## 2022-04-22 NOTE — Telephone Encounter (Signed)
Patient called in regard to recent labs that were done at [place] center due to referral . Patient wants a call back in regard.

## 2022-04-22 NOTE — Telephone Encounter (Signed)
Patient called to check on the status of the letter.  She'd also like to know which doctor sent her to the cancer center.

## 2022-04-22 NOTE — Telephone Encounter (Signed)
Pt called an informed that Dr Delice Lesch stated that Letter done. It looks like it was the [facility] neurology team that ordered the MRI that was done at the [place] center. Pls make sure she follows up with her [place] neurologist.

## 2022-04-22 NOTE — Telephone Encounter (Signed)
Letter done. It looks like it was the [facility] neurology team that ordered the MRI that was done at the [place] center. Pls make sure she follows up with her [place] neurologist

## 2022-04-22 NOTE — Telephone Encounter (Signed)
Letter done. It looks like it was the [facility] neurology team that ordered the MRI that was done at the [place] center. Pls make sure she follows up with her [place] neurologist, thanks

## 2022-04-23 NOTE — Telephone Encounter (Signed)
LMTRC

## 2022-04-24 ENCOUNTER — Telehealth: Payer: Self-pay | Admitting: Neurology

## 2022-04-24 NOTE — Telephone Encounter (Signed)
Pt called in stating the [facility] MRI results are in, but the person who can give her the results is out of the office until 05/16/22.

## 2022-04-24 NOTE — Telephone Encounter (Signed)
Results need to come from her ordering physician

## 2022-04-25 ENCOUNTER — Telehealth: Payer: Self-pay | Admitting: Neurology

## 2022-04-25 NOTE — Telephone Encounter (Signed)
Patient called to report tiny white spots on her brain found on her MRI that were not there last time.

## 2022-04-25 NOTE — Telephone Encounter (Signed)
Pt called informed she needs to call [facility] to follow up on her MRI she has a lot of questions she was advised to call [facility] to ask them

## 2022-04-26 ENCOUNTER — Other Ambulatory Visit: Payer: Self-pay | Admitting: Family Medicine

## 2022-04-30 ENCOUNTER — Telehealth: Payer: Self-pay | Admitting: Neurology

## 2022-04-30 NOTE — Telephone Encounter (Signed)
Patient called and said she'd like someone to call her doctor at [facility], she forgets name.   He said at a call yesterday she is now needing to have surgery on her brain related to her MRI 2 results.  Phone for doctor at [facility]: [phone]

## 2022-05-03 ENCOUNTER — Ambulatory Visit (INDEPENDENT_AMBULATORY_CARE_PROVIDER_SITE_OTHER): Payer: 59 | Admitting: Family Medicine

## 2022-05-03 ENCOUNTER — Encounter: Payer: Self-pay | Admitting: Family Medicine

## 2022-05-03 VITALS — BP 115/77 | HR 88 | Ht 61.0 in | Wt 164.0 lb

## 2022-05-03 DIAGNOSIS — Z748 Other problems related to care provider dependency: Secondary | ICD-10-CM

## 2022-05-03 DIAGNOSIS — F322 Major depressive disorder, single episode, severe without psychotic features: Secondary | ICD-10-CM | POA: Diagnosis not present

## 2022-05-03 DIAGNOSIS — F17200 Nicotine dependence, unspecified, uncomplicated: Secondary | ICD-10-CM

## 2022-05-03 DIAGNOSIS — G4489 Other headache syndrome: Secondary | ICD-10-CM | POA: Diagnosis not present

## 2022-05-03 DIAGNOSIS — F17210 Nicotine dependence, cigarettes, uncomplicated: Secondary | ICD-10-CM

## 2022-05-03 DIAGNOSIS — Z23 Encounter for immunization: Secondary | ICD-10-CM

## 2022-05-03 DIAGNOSIS — R569 Unspecified convulsions: Secondary | ICD-10-CM | POA: Diagnosis not present

## 2022-05-03 NOTE — Patient Instructions (Signed)
Please sched mammogram at  [facility]  Needs transport to appt at [facility] 10/19 to arrive at 11 :30, needs urgent assistance from [facility]  Work on quitting  smoking  F/U in 3 months

## 2022-05-06 ENCOUNTER — Encounter: Payer: Self-pay | Admitting: Family Medicine

## 2022-05-06 ENCOUNTER — Ambulatory Visit: Payer: Self-pay

## 2022-05-06 DIAGNOSIS — Z748 Other problems related to care provider dependency: Secondary | ICD-10-CM | POA: Insufficient documentation

## 2022-05-06 NOTE — Assessment & Plan Note (Signed)
Being treated at [facility], needs transportation for upcoming appointments involving  imaging

## 2022-05-06 NOTE — Progress Notes (Signed)
   [name]     MRN: [id number]      DOB: [birth year]   HPI [name] is here for follow up and re-evaluation of chronic medical conditions, medication management and review of any available recent lab and radiology data.  Preventive health is updated, specifically  Cancer screening and Immunization.   Is being followed at [facility] for  uncontrolled seizures, had recent MRI and needs additional imaging and appointments there as a possible abnormal focus for the seizures is identified. She has transportation challenges getting to her appointments and is asking for help with htois, wants to keep appt, first is next week.   ROS Denies recent fever or chills. Denies sinus pressure, nasal congestion, ear pain or sore throat. Denies chest congestion, productive cough or wheezing. Denies chest pains, palpitations and leg swelling Denies abdominal pain, nausea, vomiting,diarrhea or constipation.   Denies dysuria, frequency, hesitancy or incontinence. Denies joint pain, swelling and limitation in mobility.   Denies skin break down or rash.   PE  BP 115/77 (BP Location: Right Arm, Patient Position: Sitting)   Pulse 88   Ht 5\' 1"  (1.549 m)   Wt 164 lb (74.4 kg)   SpO2 97%   BMI 30.99 kg/m   Patient alert and oriented and in no cardiopulmonary distress.  HEENT: No facial asymmetry, EOMI,     Neck supple .  Chest: Clear to auscultation bilaterally.  CVS: S1, S2 no murmurs, no S3.Regular rate.  ABD: Soft non tender.   Ext: No edema  MS: Adequate ROM spine, shoulders, hips and knees.  Skin: Intact, no ulcerations or rash noted.  Psych: Good eye contact, normal affect.  not anxious or depressed appearing.  CNS: CN 2-12 intact, power,  normal throughout.no focal deficits noted.   Assessment & Plan  Current smoker Asked:confirms currently smokes cigarettes Assess: Unwilling to set a quit date, but is cutting back Advise: needs to QUIT to reduce risk of cancer, cardio and  cerebrovascular disease Assist: counseled for 5 minutes and literature provided Arrange: follow up in 2 to 4 months   Convulsions ([place]) Being treated at [facility], needs transportation for upcoming appointments involving  imaging  Depression, major, single episode, severe ([place]) Continue current med, assess at next visit  Assistance needed with transportation Has multiple appointments upcoming with Neurology at [facility] for uncontrolled seizure disorder, needs transport, urgent referral to social worker  Headache Managed by Neurology  Seizure [facility]) un dergoing investigation of seizures both in [place] and at [facility], recent abn brain scan necessitates sevral appointments in [facility] in the upcoming weeks No seizures in past 6 weeks

## 2022-05-06 NOTE — Patient Outreach (Addendum)
  Care Coordination   Initial Visit Note   05/06/2022 Name: TEYONA NICHELSON MRN: [id number] DOB: [birth year]  WILLOWDEAN LUHMANN is a 41 y.o. year old female who sees Fayrene Helper, MD for primary care. I spoke with  Fransico Setters by phone today.  What matters to the patients health and wellness today?  I need transportation to my appointment on the 19th.    Goals Addressed             This Visit's Progress    COMPLETED: Care Coordination Activities       Care Coordination Interventions: Referral received indicating patient needs assistance with transportation on 10/19 to an appointment at [facility] the patient on her health plan transportation benefits Assisted the patient in contacting [facility] who indicates they only transport up to 50 miles for medical appointments and patients trip is 60 miles Requested an exception - exception denied Attempted to contact DSS to determine if patients Medicaid benefit would provide transportation Patient reports she will contact DSS on her own Inbound call received from the patient who indicates she had to leave a voice message for DSS regarding transportation Reviewed the patient may need to have appointment rescheduled to a location that is closer to home in order to obtain a ride through her payor benefit Encouraged the patient to contact her [place] provider to review challenges with transportation to determine if there are other options for the procedure         SDOH assessments and interventions completed:  Yes  SDOH Interventions Today    Flowsheet Row Most Recent Value  SDOH Interventions   Transportation Interventions Payor Benefit        Care Coordination Interventions Activated:  Yes  Care Coordination Interventions:  Yes, provided   Follow up plan: No further intervention required.   Encounter Outcome:  Pt. Visit Completed   Daneen Schick, BSW, CDP Social Worker, Certified Dementia  Practitioner [place] [facility]  Care Coordination [phone]

## 2022-05-06 NOTE — Patient Instructions (Signed)
Visit Information  Thank you for taking time to visit with me today. Please don't hesitate to contact me if I can be of assistance to you.   Following are the goals we discussed today:   Goals Addressed             This Visit's Progress    COMPLETED: Care Coordination Activities       Care Coordination Interventions: Referral received indicating patient needs assistance with transportation on 10/19 to an appointment at [facility] the patient on her health plan transportation benefits Assisted the patient in contacting [facility] who indicates they only transport up to 50 miles for medical appointments and patients trip is 60 miles Requested an exception - exception denied Attempted to contact DSS to determine if patients Medicaid benefit would provide transportation Patient reports she will contact DSS on her own         If you are experiencing a Mental Health or [place] or need someone to talk to, please call the [facility]: [phone]  Patient verbalizes understanding of instructions and care plan provided today and agrees to view in [place]. Active MyChart status and patient understanding of how to access instructions and care plan via MyChart confirmed with patient.     No further follow up required: Please contact [place] at [phone].  Thank you, Daneen Schick, BSW, CDP Social Worker, Certified Dementia Practitioner [facility]  Care Coordination [phone]

## 2022-05-06 NOTE — Assessment & Plan Note (Signed)
Has multiple appointments upcoming with Neurology at [facility] for uncontrolled seizure disorder, needs transport, urgent referral to social worker

## 2022-05-06 NOTE — Assessment & Plan Note (Signed)
Continue current med, assess at next visit

## 2022-05-06 NOTE — Assessment & Plan Note (Addendum)
un dergoing investigation of seizures both in [place] and at [facility], recent abn brain scan necessitates sevral appointments in [facility] in the upcoming weeks No seizures in past 6 weeks

## 2022-05-06 NOTE — Assessment & Plan Note (Signed)
Asked:confirms currently smokes cigarettes °Assess: Unwilling to set a quit date, but is cutting back °Advise: needs to QUIT to reduce risk of cancer, cardio and cerebrovascular disease °Assist: counseled for 5 minutes and literature provided °Arrange: follow up in 2 to 4 months ° °

## 2022-05-06 NOTE — Assessment & Plan Note (Signed)
Managed by Neurology

## 2022-05-10 ENCOUNTER — Telehealth: Payer: Self-pay

## 2022-05-10 NOTE — Telephone Encounter (Signed)
Patient called has to be October 26 @ 1:00 pm for CT at the cancer center at [facility]. Said Dr Moshe Cipro nurse will arrange her a ride. Please contact patient back to confirm [phone].

## 2022-05-10 NOTE — Telephone Encounter (Signed)
Spoke with patient in regards to transportation there is no options locally that will provide transportation that far patient has a number given to her from [place] she is going to call to try to arrange transportation.

## 2022-05-14 ENCOUNTER — Other Ambulatory Visit: Payer: Self-pay | Admitting: *Deleted

## 2022-05-14 ENCOUNTER — Telehealth: Payer: Self-pay | Admitting: Neurology

## 2022-05-14 NOTE — Patient Outreach (Signed)
  Care Coordination   05/14/2022  Name: [name] MRN: [id number] DOB: 1980-11-29   Care Coordination Outreach Attempts:  An unsuccessful telephone outreach was attempted today to offer the patient information about available care coordination services as a benefit of their health plan. HIPAA compliant message left on voicemail, providing contact information for CSW, encouraging patient to return CSW's call at her earliest convenience.  Follow Up Plan:  Additional outreach attempts will be made to offer the patient care coordination information and services.   Encounter Outcome:  No Answer.   Care Coordination Interventions Activated:  No.    Care Coordination Interventions:  No, not indicated.    Nat Christen, BSW, MSW, LCSW  Licensed Education officer, environmental Health System  Mailing [place] [address] [place], [place] [address] Physical Address-[address], [place], [place] [address] Toll Free Main # [phone] Fax # [phone] Cell # [phone] [email or web address]

## 2022-05-14 NOTE — Telephone Encounter (Signed)
Patient needs a letter stating that she needs a Aid to come in and help her daily. They are coming in for a home visit today at 1:30

## 2022-05-17 ENCOUNTER — Ambulatory Visit: Payer: Self-pay | Admitting: *Deleted

## 2022-05-17 ENCOUNTER — Encounter: Payer: Self-pay | Admitting: *Deleted

## 2022-05-17 NOTE — Patient Instructions (Signed)
Visit Information  Thank you for taking time to visit with me today. Please don't hesitate to contact me if I can be of assistance to you.   Please call the care guide team at [phone] if you need to cancel or reschedule your appointment.   If you are experiencing a Mental Health or Behavioral Health Crisis or need someone to talk to, please call the Suicide and Crisis Lifeline: 988 call the [place] National Suicide Prevention Lifeline: 1-800-273-8255 or TTY: 1-800-799-4 TTY (1-800-799-4889) to talk to a trained counselor call 1-800-273-TALK (toll free, 24 hour hotline) go to [facility] [address], [place] ([phone]) call the [place] Crisis Line: 800-939-9988 call 911  Patient verbalizes understanding of instructions and care plan provided today and agrees to view in MyChart. Active MyChart status and patient understanding of how to access instructions and care plan via MyChart confirmed with patient.     No further follow up required.  Saanvi Hakala, BSW, MSW, LCSW  Licensed Clinical Social Worker  [facility] [place] [facility]  Mailing Address-[address], [place], [place] [address] Physical Address-[address], [place], [place] [address] Toll Free Main # [phone] Fax # [phone] Cell # [phone] [email or web address]            

## 2022-05-17 NOTE — Patient Outreach (Signed)
  Care Coordination   Initial Visit Note   05/17/2022  Name: [name] MRN: [id number] DOB: 1980/09/09  [name] is a 41 y.o. year old female who sees Fayrene Helper, MD for primary care. I spoke with Fransico Setters by phone today.  What matters to the patients health and wellness today?  No Interventions Identified.  SDOH assessments and interventions completed:  Yes.  SDOH Interventions Today    Flowsheet Row Most Recent Value  SDOH Interventions   Food Insecurity Interventions Intervention Not Indicated  Housing Interventions Intervention Not Indicated  Transportation Interventions Intervention Not Indicated  Utilities Interventions Intervention Not Indicated  Alcohol Usage Interventions Intervention Not Indicated (Score <7)  Financial Strain Interventions Intervention Not Indicated  Physical Activity Interventions Patient Refused  Stress Interventions Intervention Not Indicated  Social Connections Interventions Intervention Not Indicated     Care Coordination Interventions Activated:  Yes.   Care Coordination Interventions:  Yes, provided.   Follow up plan: No further intervention required.   Encounter Outcome:  Pt. Visit Completed.   Nat Christen, BSW, MSW, LCSW  Licensed Education officer, environmental Health System  Mailing [place] [address], [place], [place] [address] Physical Address-[address] [place], [place] [address] Toll Free Main # [phone] Fax # [phone] Cell # [phone] [email or web address]

## 2022-05-21 ENCOUNTER — Ambulatory Visit: Payer: Medicaid Other | Admitting: Neurology

## 2022-05-21 NOTE — Patient Outreach (Signed)
Error

## 2022-05-22 ENCOUNTER — Ambulatory Visit: Payer: 59 | Admitting: *Deleted

## 2022-05-22 ENCOUNTER — Ambulatory Visit (HOSPITAL_COMMUNITY): Payer: Medicaid Other

## 2022-05-22 ENCOUNTER — Encounter: Payer: Self-pay | Admitting: *Deleted

## 2022-05-22 NOTE — Patient Instructions (Signed)
Visit Information  Thank you for taking time to visit with me today. Please don't hesitate to contact me if I can be of assistance to you.   Please call the care guide team at [phone] if you need to cancel or reschedule your appointment.   If you are experiencing a Mental Health or Behavioral Health Crisis or need someone to talk to, please call the Suicide and Crisis Lifeline: 988 call the [place] National Suicide Prevention Lifeline: 1-800-273-8255 or TTY: 1-800-799-4 TTY (1-800-799-4889) to talk to a trained counselor call 1-800-273-TALK (toll free, 24 hour hotline) go to [facility] [address], [place] ([phone]) call the [place] Crisis Line: 800-939-9988 call 911  Patient verbalizes understanding of instructions and care plan provided today and agrees to view in MyChart. Active MyChart status and patient understanding of how to access instructions and care plan via MyChart confirmed with patient.     No further follow up required.  Madalen Gavin, BSW, MSW, LCSW  Licensed Clinical Social Worker  [facility] [place] [facility]  Mailing Address-[address], [place], [place] [address] Physical Address-[address], [place], [place] [address] Toll Free Main # [phone] Fax # [phone] Cell # [phone] [email or web address]            

## 2022-05-22 NOTE — Patient Outreach (Signed)
  Care Coordination   Follow Up Visit Note   05/22/2022  Name: [name] MRN: [id number] DOB: Dec 03, 1980  [name] is a 41 y.o. year old female who sees Fayrene Helper, MD for primary care. I spoke with Fransico Setters by phone today.  What matters to the patients health and wellness today?   Transportation resources provided.  CSW collaboration with representative from [employer] [phone]), to inquire about transportation services, as well as put patient in direct contact.   SDOH assessments and interventions completed:  Yes.  Care Coordination Interventions Activated:  Yes.   Care Coordination Interventions:  Yes, provided.   Follow up plan: No further intervention required.   Encounter Outcome:  Pt. Visit Completed.   Nat Christen, BSW, MSW, LCSW  Licensed Education officer, environmental Health System  Mailing [place] [address], [place], [place] [address] Physical Address-[address], [place], [place] [address] Toll Free Main # [phone] Fax # [phone] Cell # [phone] [email or web address]

## 2022-05-23 ENCOUNTER — Telehealth: Payer: Self-pay | Admitting: Neurology

## 2022-05-23 NOTE — Telephone Encounter (Signed)
Pt called she stated that transportation for her to go to [facility] was set up 2 weeks ago, she said she got a letter when she called yesterday they said they didn't know anything about it she told them she had a letter they still didn't come to get her this morning, she had to call [facility] they tried to arrange transport and didn't have anyone to come get her. She called [facility] this morning to reschedule her appointment, Pt stated that her mom could not take her she doesn't drive that far, she stated that her son is back at work she will reschedule for a day he is off so he can take her,. Yesterday she go so upset and cried so much her head started hurting she took both a nurtec and a [place] she is still having a headache today, education was done on how to take her rescue medication that you do not take them both in 24 hours she stated that she understood, Pt advised that I would let Dr Delice Lesch know what was going on,

## 2022-05-23 NOTE — Telephone Encounter (Signed)
Patient called to report she was not able to keep her CT appointment with [facility] due to no transportation.  Patient stated her transportation cancelled on her at the last minute due to the facility being too far away. Their number is [phone]. She said they asked for justification for the ride then cancelled.   Patient stated she was in tears crying all day and she has gotten a migraine. She took a Games developer and a Nurtec without relief.  She'd like to speak with someone to make a plan.

## 2022-05-30 NOTE — Telephone Encounter (Signed)
Will discuss on upcoming f/u

## 2022-06-05 ENCOUNTER — Ambulatory Visit: Payer: Medicaid Other | Admitting: Neurology

## 2022-06-05 ENCOUNTER — Encounter: Payer: Self-pay | Admitting: Neurology

## 2022-06-17 ENCOUNTER — Telehealth: Payer: Self-pay | Admitting: Family Medicine

## 2022-06-17 NOTE — Telephone Encounter (Signed)
PCS  forms  Noted  Copied  Sleeved   In brown folder

## 2022-06-18 ENCOUNTER — Ambulatory Visit: Payer: 59 | Admitting: Family Medicine

## 2022-06-18 ENCOUNTER — Encounter: Payer: Self-pay | Admitting: Family Medicine

## 2022-06-24 ENCOUNTER — Telehealth: Payer: Self-pay | Admitting: Family Medicine

## 2022-06-24 NOTE — Telephone Encounter (Signed)
Cap forms   Noted  Copied Sleeved in brown folder

## 2022-06-26 ENCOUNTER — Encounter: Payer: Self-pay | Admitting: Family Medicine

## 2022-06-26 ENCOUNTER — Ambulatory Visit (INDEPENDENT_AMBULATORY_CARE_PROVIDER_SITE_OTHER): Payer: 59 | Admitting: Family Medicine

## 2022-06-26 VITALS — BP 126/82 | HR 76 | Ht 61.0 in | Wt 163.1 lb

## 2022-06-26 DIAGNOSIS — R569 Unspecified convulsions: Secondary | ICD-10-CM | POA: Diagnosis not present

## 2022-06-26 DIAGNOSIS — F322 Major depressive disorder, single episode, severe without psychotic features: Secondary | ICD-10-CM

## 2022-06-26 DIAGNOSIS — Z0279 Encounter for issue of other medical certificate: Secondary | ICD-10-CM

## 2022-06-26 DIAGNOSIS — F17210 Nicotine dependence, cigarettes, uncomplicated: Secondary | ICD-10-CM

## 2022-06-26 DIAGNOSIS — R6889 Other general symptoms and signs: Secondary | ICD-10-CM | POA: Diagnosis not present

## 2022-06-26 DIAGNOSIS — F17200 Nicotine dependence, unspecified, uncomplicated: Secondary | ICD-10-CM

## 2022-06-26 NOTE — Patient Instructions (Signed)
F/U in January as before, call if you need me sooner  Medications are as prescribed and listed  All the best with upcoming surgery which you need  Forms will be completed and faxe d for CAPS AND PCS SERVICES THIS WEEK  nO SMOKING TO GET YOU IN [facility]  Thanks for choosing [facility], we consider it a privelige to serve you.

## 2022-06-30 ENCOUNTER — Encounter: Payer: Self-pay | Admitting: Family Medicine

## 2022-06-30 NOTE — Assessment & Plan Note (Signed)
Asked:confirms currently smokes cigarettes °Assess: Unwilling to set a quit date, but is cutting back °Advise: needs to QUIT to reduce risk of cancer, cardio and cerebrovascular disease °Assist: counseled for 5 minutes and literature provided °Arrange: follow up in 2 to 4 months ° °

## 2022-06-30 NOTE — Assessment & Plan Note (Signed)
Uncontrolled , not suicidal or homicidal, refer Psych

## 2022-06-30 NOTE — Assessment & Plan Note (Signed)
Upcoming surgery in the near future, will need PCS services

## 2022-06-30 NOTE — Assessment & Plan Note (Signed)
Needs PCS services, high risk of injury due to uncontrolled seizures and forgetfullness

## 2022-06-30 NOTE — Progress Notes (Signed)
   [name]     MRN: [id number]      DOB: 07-22-1981   HPI [name] is here for follow up and re-evaluation of chronic medical conditions, medication management and review of any available recent lab and radiology data.  Has upcoming intracranial surgery to correct seizure disorder. Will need to have someone with her 24/7 for 3 weeks post op per her report, therfore requesting PCS services Has recently had a fall and is moving to an apt with no steps, also hopes to ave less disruptive neighbors   ROS Denies recent fever or chills. Denies sinus pressure, nasal congestion, ear pain or sore throat. Denies chest congestion, productive cough or wheezing. Denies chest pains, palpitations and leg swelling Denies abdominal pain, nausea, vomiting,diarrhea or constipation.   Denies dysuria, frequency, hesitancy or incontinence. . Denies skin break down or rash.   PE  BP 126/82   Pulse 76   Ht 5\' 1"  (1.549 m)   Wt 163 lb 1.9 oz (74 kg)   SpO2 99%   BMI 30.82 kg/m   Patient alert and oriented and in no cardiopulmonary distress.  HEENT: No facial asymmetry, EOMI,     Neck supple .  Chest: Clear to auscultation bilaterally.  CVS: S1, S2 no murmurs, no S3.Regular rate.  ABD: Soft non tender.   Ext: No edema  MS: Adequate ROM spine, shoulders, hips and knees.  Skin: Intact, no ulcerations or rash noted.  Psych: Good eye contact, flat affect. Memory impaired, depresed appearing.  CNS: CN 2-12 intact, power,  normal throughout.no focal deficits noted.   Assessment & Plan  Current smoker Asked:confirms currently smokes cigarettes Assess: Unwilling to set a quit date, but is cutting back Advise: needs to QUIT to reduce risk of cancer, cardio and cerebrovascular disease Assist: counseled for 5 minutes and literature provided Arrange: follow up in 2 to 4 months   Convulsions (HCC) Upcoming surgery in the near future, will need PCS services  Forgetfulness Needs PCS  services, high risk of injury due to uncontrolled seizures and forgetfullness  Depression, major, single episode, severe (HCC) Uncontrolled , not suicidal or homicidal, refer Psych

## 2022-07-01 ENCOUNTER — Telehealth: Payer: Self-pay | Admitting: Family Medicine

## 2022-07-01 NOTE — Telephone Encounter (Signed)
Pt called to check on the status of the rest of her paperwork

## 2022-07-02 NOTE — Telephone Encounter (Signed)
Patient picked up forms.

## 2022-07-02 NOTE — Telephone Encounter (Signed)
Forms up front for pick up  

## 2022-07-08 ENCOUNTER — Telehealth: Payer: Self-pay | Admitting: Family Medicine

## 2022-07-08 NOTE — Telephone Encounter (Signed)
Patient called asked if nurse can give her a call about a surgery to be done in [facility] on her brain. Patient called asking can get his procedure done closer to [place] area. Call back # [phone].

## 2022-07-09 ENCOUNTER — Encounter: Payer: Self-pay | Admitting: Adult Health

## 2022-07-09 ENCOUNTER — Ambulatory Visit (INDEPENDENT_AMBULATORY_CARE_PROVIDER_SITE_OTHER): Payer: 59 | Admitting: Adult Health

## 2022-07-09 VITALS — BP 147/98 | HR 80 | Ht 62.0 in | Wt 162.0 lb

## 2022-07-09 DIAGNOSIS — Z30013 Encounter for initial prescription of injectable contraceptive: Secondary | ICD-10-CM | POA: Diagnosis not present

## 2022-07-09 DIAGNOSIS — Z3202 Encounter for pregnancy test, result negative: Secondary | ICD-10-CM | POA: Diagnosis not present

## 2022-07-09 LAB — POCT URINE PREGNANCY: Preg Test, Ur: NEGATIVE

## 2022-07-09 MED ORDER — MEDROXYPROGESTERONE ACETATE 150 MG/ML IM SUSP
150.0000 mg | Freq: Once | INTRAMUSCULAR | Status: AC
  Administered 2022-07-09: 150 mg via INTRAMUSCULAR

## 2022-07-09 MED ORDER — MEDROXYPROGESTERONE ACETATE 150 MG/ML IM SUSP: 150.0000 mg | INTRAMUSCULAR | 4 refills | Status: DC

## 2022-07-09 NOTE — Addendum Note (Signed)
Addended by: Colen Darling on: 07/09/2022 03:11 PM   Modules accepted: Orders

## 2022-07-09 NOTE — Progress Notes (Signed)
  Subjective:     Patient ID: [name], female   DOB: 07/27/1980, 41 y.o.   MRN: [id number]  HPI [name] is a 41 year old black female,single, G3P3 in wanting to get back on depo.  Last pap was negative HPV and malignancy 09/27/21.  PCP is Dr Lodema Hong  Review of Systems Periods not regular, has missed depo and wants to restart Not currently having sex Reviewed past medical,surgical, social and family history. Reviewed medications and allergies.     Objective:   Physical Exam BP (!) 147/98 (BP Location: Right Arm, Patient Position: Sitting, Cuff Size: Normal)   Pulse 80   Ht 5\' 2"  (1.575 m)   Wt 162 lb (73.5 kg)   BMI 29.63 kg/m  UPT is negative Skin warm and dry.  Lungs: clear to ausculation bilaterally. Cardiovascular: regular rate and rhythm.    Fall risk is high  Upstream - 07/09/22 1425       Pregnancy Intention Screening   Does the patient want to become pregnant in the next year? No    Does the patient's partner want to become pregnant in the next year? No    Would the patient like to discuss contraceptive options today? No      Contraception Wrap Up   Current Method Abstinence    End Method Hormonal Injection    Contraception Counseling Provided Yes    How was the end contraceptive method provided? Prescription             Assessment:     1. Pregnancy examination or test, negative result  2. Encounter for initial prescription of injectable contraceptive Will give shot in office today and send rx doe depo to drug store  Use condoms for 2 weeks if has sex Meds ordered this encounter  Medications   medroxyPROGESTERone (DEPO-PROVERA) 150 MG/ML injection    Sig: Inject 1 mL (150 mg total) into the muscle every 3 (three) months.    Dispense:  1 mL    Refill:  4    Order Specific Question:   Supervising Provider    Answer:   07/11/22 [2510]       Plan:     Return in 12 weeks for depo

## 2022-07-10 NOTE — Telephone Encounter (Signed)
Spoke to patient

## 2022-07-16 ENCOUNTER — Other Ambulatory Visit (HOSPITAL_COMMUNITY): Payer: Self-pay

## 2022-07-16 ENCOUNTER — Telehealth: Payer: Self-pay

## 2022-07-16 NOTE — Telephone Encounter (Signed)
Pharmacy Patient Advocate Encounter  Prior Authorization for Bernita Raisin 100MG  tablets has been approved.    PA# PA Case ID #:  Effective dates: 07/16/2022 through 07/22/2023

## 2022-07-16 NOTE — Telephone Encounter (Signed)
Pharmacy Patient Advocate Encounter   Received notification from CoverMyMeds that prior authorization for Ubrelvy 100MG  tablets is required/requested.    PA submitted on 07/16/2022 to (ins) [employer] via CoverMyMeds  Key [id number]   Status is pending

## 2022-07-30 HISTORY — PX: BRAIN SURGERY: SHX531

## 2022-08-02 ENCOUNTER — Encounter: Payer: Self-pay | Admitting: *Deleted

## 2022-08-02 ENCOUNTER — Telehealth: Payer: Self-pay | Admitting: *Deleted

## 2022-08-02 NOTE — Patient Outreach (Signed)
  Care Coordination TOC Note Transition Care Management Follow-up Telephone Call Date of discharge and from where: [facility] 08/01/22 How have you been since you were released from the hospital? "Doing ok but in some pain. I have some swelling around my eye and I just talked to my surgeon's nurse about whether I can put ice on it." Was told that she could put some ice around the eye but not near the incision.  Any questions or concerns? No  Items Reviewed: Did the pt receive and understand the discharge instructions provided? Yes  Medications obtained and verified? Yes . Has oxycocdone, protonix, zofran, and senokot-s. Will pickup dexamethasone today. It was not in stock. Explained role of steroid and that it should help with swelling and may improve pain symptoms if they're relate to swelling.  Other? Yes reviewed discharge instructions and incision care. Needs to drink more Gatorade instead of water to increase sodium levels. Needs to repeat BMP in 1 week with PCP to rck sodium level.  Any new allergies since your discharge? No  Dietary orders reviewed? Yes. Normal diet high in lean protein and low in sugar/concentrated carbs to reduce risk of infection and promote wound healing. Do you have support at home? Yes   Home Care and Equipment/Supplies: Were home health services ordered? no If so, what is the name of the agency?   Has the agency set up a time to come to the patient's home? not applicable Were any new equipment or medical supplies ordered?  No What is the name of the medical supply agency?  Were you able to get the supplies/equipment? not applicable Do you have any questions related to the use of the equipment or supplies? No  Functional Questionnaire: (I = Independent and D = Dependent) ADLs: I. Except for driving  Bathing/Dressing- I  Meal Prep- I  Eating- I  Maintaining continence- I  Transferring/Ambulation- I  Managing Meds- I  Follow up appointments  reviewed:  PCP Hospital f/u appt confirmed? Yes  Scheduled to see Dr Moshe Cipro on 08/08/22 at 11:00. Message sent to Dr Griffin Dakin office to verify if this already scheduled appt is appropriate for hospital f/u. Timeframe is perfect for recommended rck of sodium level. [facility] f/u appt confirmed? Yes  Scheduled to see Dr Virgina Evener on 08/14/22 at 9:20 Are transportation arrangements needed? No  If their condition worsens, is the pt aware to call PCP or go to the Emergency Dept.? Yes Was the patient provided with contact information for the PCP's office or ED? Yes Was to pt encouraged to call back with questions or concerns? Yes  SDOH assessments and interventions completed:   Yes SDOH Interventions Today    Flowsheet Row Most Recent Value  SDOH Interventions   Food Insecurity Interventions Intervention Not Indicated  Housing Interventions Intervention Not Indicated  Transportation Interventions Intervention Not Indicated       Care Coordination Interventions:  See above interventions    Encounter Outcome:  Pt. Visit Completed    Chong Sicilian, BSN, RN-BC RN Care Coordinator [place]: [phone] Main #: [phone]

## 2022-08-08 ENCOUNTER — Telehealth: Payer: Self-pay | Admitting: Family Medicine

## 2022-08-08 ENCOUNTER — Encounter: Payer: Self-pay | Admitting: Family Medicine

## 2022-08-08 ENCOUNTER — Ambulatory Visit (INDEPENDENT_AMBULATORY_CARE_PROVIDER_SITE_OTHER): Payer: 59 | Admitting: Family Medicine

## 2022-08-08 VITALS — BP 146/85 | HR 84 | Ht 61.5 in | Wt 162.0 lb

## 2022-08-08 DIAGNOSIS — Z7689 Persons encountering health services in other specified circumstances: Secondary | ICD-10-CM

## 2022-08-08 DIAGNOSIS — G40019 Localization-related (focal) (partial) idiopathic epilepsy and epileptic syndromes with seizures of localized onset, intractable, without status epilepticus: Secondary | ICD-10-CM

## 2022-08-08 DIAGNOSIS — K5903 Drug induced constipation: Secondary | ICD-10-CM | POA: Diagnosis not present

## 2022-08-08 DIAGNOSIS — E669 Obesity, unspecified: Secondary | ICD-10-CM

## 2022-08-08 MED ORDER — POLYETHYLENE GLYCOL 3350 17 GM/SCOOP PO POWD: ORAL | 0 refills | Status: DC

## 2022-08-08 MED ORDER — BISACODYL 5 MG PO TBEC: DELAYED_RELEASE_TABLET | ORAL | 0 refills | Status: DC

## 2022-08-08 NOTE — Progress Notes (Signed)
   [name]     MRN: [id number]      DOB: [birth year]   HPI [name] is here for [facility] visit following  uncomplicated  hospitalization at [facility] from 01/08 to 08/01/2022 for treatment of intractable seizure disorder had craniotomy on 07/30/2022, home only problem is constipation, did not pick up prescribed stool softener. Denies recent fever or chills.Denies uncontrolled pain. No report of drainage from surgical site Hospital course reviewed at visit, management of wound and activities reviewed at visit and questions answered  ROS: Denies sinus pressure, nasal congestion, ear pain or sore throat. Denies chest congestion, productive cough or wheezing. Denies chest pains, palpitations and leg swelling Denies abdominal pain, nausea, vomiting,.   Denies dysuria, frequency, hesitancy or incontinence. Denies joint pain, swelling and limitation in mobility. Denies headaches, seizures, numbness, or tingling. PE  BP 131/81   Pulse 84   Ht 5' 1.5" (1.562 m)   Wt 162 lb 0.6 oz (73.5 kg)   SpO2 97%   BMI 30.12 kg/m   Patient alert and oriented and in no cardiopulmonary distress. Neck Adequate ROM, though spasm present, EOMI, mild facial asymmetry with left .periorbital edema  Chest: Clear to auscultation bilaterally.  CVS: S1, S2 no murmurs, no S3.Regular rate.  ABD: Soft non tender.   Ext: No edema  MS: Adequate ROM spine, shoulders, hips and knees.  Skin: Intact, no ulcerations or rash noted.  Psych: Good eye contact, flat affect. CNS: CN 2-12 intact, power,  normal throughout.no focal deficits noted.   Assessment & Plan  Localization-related (focal) (partial) idiopathic epilepsy and epileptic syndromes with seizures of localized onset, intractable, without status epilepticus ([place]) S/p craniotomy on 07/30/2022 at [facility] recovering well noi sign of infection at opsite, staples intact, incision clean and dry, no report of seizures or fever since discharge Keep f/u with neurosurg  as scheduled  Encounter for support and coordination of transition of care Patient in for follow up of recent hospitalization. Discharge summary, and laboratory and radiology data are reviewed, and any questions or concerns  are discussed. Specific issues requiring follow up are specifically addressed.   Constipation Miralax and dulcolax prescribed

## 2022-08-08 NOTE — Patient Instructions (Addendum)
F/U in 2.5  months, call if you need me sooner  Chem 7 today  Daily miralax powder and dulcolax tabs every 3 days if needed is prescribed for constipation  You  can take the muscle relaxant every SIX hours for neck spasm  Thanks for choosing [facility], we consider it a privelige to serve you.

## 2022-08-08 NOTE — Telephone Encounter (Signed)
Patient called said the medicine that was called into her pharmacy not covered by her insurance cost $15.00 patient has no money   Please return patient call. [phone]  bisacodyl 5 MG EC tablet [[id number]]   [place]

## 2022-08-09 NOTE — Telephone Encounter (Signed)
I spoke with pt and advised her to buy any OTC laxative she can afford so that she can have a BM, and to eat foods that help her to go to bathroom, she states ensure plus does, she has some and will try that

## 2022-08-12 ENCOUNTER — Encounter: Payer: Self-pay | Admitting: Family Medicine

## 2022-08-12 DIAGNOSIS — K5900 Constipation, unspecified: Secondary | ICD-10-CM | POA: Insufficient documentation

## 2022-08-12 NOTE — Assessment & Plan Note (Signed)
Patient in for follow up of recent hospitalization. Discharge summary, and laboratory and radiology data are reviewed, and any questions or concerns  are discussed. Specific issues requiring follow up are specifically addressed.  

## 2022-08-12 NOTE — Assessment & Plan Note (Signed)
Miralax and dulcolax prescribed

## 2022-08-12 NOTE — Assessment & Plan Note (Signed)
S/p craniotomy on 07/30/2022 at [facility] recovering well noi sign of infection at opsite, staples intact, incision clean and dry, no report of seizures or fever since discharge Keep f/u with neurosurg as scheduled

## 2022-08-22 LAB — BMP8+EGFR
BUN/Creatinine Ratio: 4 — ABNORMAL LOW (ref 9–23)
BUN: 3 mg/dL — ABNORMAL LOW (ref 6–24)
CO2: 16 mmol/L — ABNORMAL LOW (ref 20–29)
Calcium: 9.3 mg/dL (ref 8.7–10.2)
Chloride: 99 mmol/L (ref 96–106)
Creatinine, Ser: 0.74 mg/dL (ref 0.57–1.00)
Glucose: 81 mg/dL (ref 70–99)
Potassium: 4.6 mmol/L (ref 3.5–5.2)
Sodium: 129 mmol/L — ABNORMAL LOW (ref 134–144)
eGFR: 103 mL/min/{1.73_m2} (ref >59)

## 2022-08-27 ENCOUNTER — Other Ambulatory Visit: Payer: Self-pay | Admitting: Neurology

## 2022-09-06 ENCOUNTER — Telehealth: Payer: Self-pay | Admitting: Family Medicine

## 2022-09-06 NOTE — Telephone Encounter (Signed)
LMTRC-KG

## 2022-09-06 NOTE — Telephone Encounter (Signed)
Pt called wanting to know the name of the depression medication she is taking

## 2022-09-06 NOTE — Telephone Encounter (Signed)
Patient aware.

## 2022-09-09 ENCOUNTER — Other Ambulatory Visit: Payer: Self-pay | Admitting: Neurology

## 2022-09-12 ENCOUNTER — Telehealth: Payer: Self-pay | Admitting: Anesthesiology

## 2022-09-12 ENCOUNTER — Telehealth: Payer: Self-pay | Admitting: Family Medicine

## 2022-09-12 ENCOUNTER — Telehealth: Payer: Self-pay | Admitting: Neurology

## 2022-09-12 NOTE — Telephone Encounter (Signed)
Pt called back in stating [facility] did not stop the Briviact. They think our office needs to send another prescription. She also stated she will be going back to [place] on 10/22/22 & 01/15/23.  She also stated since her Nurtec did not work, she is wanting to see if there is something else she could take?

## 2022-09-12 NOTE — Telephone Encounter (Signed)
Tried calling patient and no answer.

## 2022-09-12 NOTE — Telephone Encounter (Signed)
From the notes I received, they want to continue her on all seizure medications for now. Continue Briviact. When is her next appointment with the neurologist at [facility]? Thanks

## 2022-09-12 NOTE — Telephone Encounter (Signed)
Pt called stating she had her craniotomy for her seizures on 01.9.24. states she would like to ask Dr Delice Lesch if she needs to continue taking Briviact 100 mg.

## 2022-09-12 NOTE — Telephone Encounter (Signed)
She said that [facility] took the Briviact out of her pill pac and she is going to call [facility] to make sure they did not stop it can call us back and let me know and let me know when her next appt is with them,

## 2022-09-12 NOTE — Telephone Encounter (Signed)
Pt called stating she thought she was supposed to be on some type of medication for anxiety/depression? With some other issues to address

## 2022-09-16 ENCOUNTER — Other Ambulatory Visit: Payer: Self-pay | Admitting: Neurology

## 2022-09-16 ENCOUNTER — Telehealth: Payer: Self-pay | Admitting: Family Medicine

## 2022-09-16 NOTE — Telephone Encounter (Signed)
Patient aware of what medication is used for

## 2022-09-16 NOTE — Telephone Encounter (Signed)
Pt wants to speak to nurse in regards to her medication famciclovir (FAMVIR) 250 MG tablet . Can you please contact patient when available?

## 2022-09-18 ENCOUNTER — Other Ambulatory Visit: Payer: Self-pay | Admitting: Neurology

## 2022-09-18 ENCOUNTER — Ambulatory Visit (INDEPENDENT_AMBULATORY_CARE_PROVIDER_SITE_OTHER): Payer: 59 | Admitting: Internal Medicine

## 2022-09-18 ENCOUNTER — Encounter: Payer: Self-pay | Admitting: Internal Medicine

## 2022-09-18 VITALS — BP 122/80 | HR 92 | Ht 61.0 in | Wt 161.0 lb

## 2022-09-18 DIAGNOSIS — B351 Tinea unguium: Secondary | ICD-10-CM | POA: Diagnosis not present

## 2022-09-18 DIAGNOSIS — B356 Tinea cruris: Secondary | ICD-10-CM | POA: Insufficient documentation

## 2022-09-18 DIAGNOSIS — B353 Tinea pedis: Secondary | ICD-10-CM | POA: Diagnosis not present

## 2022-09-18 MED ORDER — TERBINAFINE HCL 250 MG PO TABS: 250.0000 mg | ORAL_TABLET | Freq: Every day | ORAL | 0 refills | Status: DC

## 2022-09-18 NOTE — Progress Notes (Signed)
   HPI:[name] is a 42 y.o. female who presents for evaluation of Follow-up (Itching in groin area, reports feet and hands peeling ) . For the details of today's visit, please refer to the assessment and plan.  Physical Exam: Vitals:   09/18/22 0842  BP: 122/80  Pulse: 92  SpO2: 98%  Weight: 161 lb 0.6 oz (73 kg)  Height: 5' 1"$  (1.549 m)     Physical Exam Constitutional:      Appearance: Normal appearance. She is not ill-appearing.  Genitourinary:    Pubic Area: Rash present.       Comments: Rash in inner thighs and pubic area, well-demarcated scaly plaque with raised border and central clearing Skin:    Findings: Rash present. Rash is scaling (pruritic scaling rash on feet. discolored thick great toe nalis).      Assessment & Plan:   [name] was seen today for follow-up.  Tinea cruris Assessment & Plan: Patient presents with rash in her groin. She is not sexually active. 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 No burning with urination or vulvovaginal irritation.  She also has skin peeling on hand and feet.   -Examined patient and she has tinea cruris.  She also has tinea pedis of both feet and onychomycosis.  Will start patient on oral terbinafine to treat onychomycosis.  Explained to patient this will also treat her tinea pedis and tinea cruris. - Check CMP. Follow up in 6 weeks to recheck CMP.   Orders: -     CMP14+EGFR -     Terbinafine HCl; Take 1 tablet (250 mg total) by mouth daily.  Dispense: 90 tablet; Refill: 0  Onychomycosis -     CMP14+EGFR -     Terbinafine HCl; Take 1 tablet (250 mg total) by mouth daily.  Dispense: 90 tablet; Refill: 0  Tinea pedis of both feet Assessment & Plan:    Orders: -     CMP14+EGFR -     Terbinafine HCl; Take 1 tablet (250 mg total) by mouth daily.  Dispense: 90 tablet; Refill: 0      Elizabeth Dy, MD

## 2022-09-18 NOTE — Patient Instructions (Signed)
Thank you, Ms.Fransico Setters for allowing [place] to provide your care today.    Medication prescribed: - terbinafine (LAMISIL) 250 MG tablet; Take 1 tablet (250 mg total) by mouth daily.  Dispense: 90 tablet; Refill: 0  Go by the lab.  Follow up in 6 weeks . We will check lab work then and see if your fungal infection is improving.       Tamsen Snider, M.D.

## 2022-09-18 NOTE — Assessment & Plan Note (Addendum)
Patient presents with rash in her groin. She is not sexually active. 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 No burning with urination or vulvovaginal irritation.  She also has skin peeling on hand and feet.   -Examined patient and she has tinea cruris.  She also has tinea pedis of both feet and onychomycosis.  Will start patient on oral terbinafine to treat onychomycosis.  Explained to patient this will also treat her tinea pedis and tinea cruris. - Check CMP. Follow up in 6 weeks to recheck CMP.

## 2022-09-18 NOTE — Telephone Encounter (Signed)
Please call patient she has questions about medication  Briviact being called in    And she think she has a seizure last night and bite her tongue    Please call

## 2022-09-19 ENCOUNTER — Encounter: Payer: Self-pay | Admitting: Radiology

## 2022-09-19 LAB — CMP14+EGFR
ALT: 9 IU/L (ref 0–32)
AST: 11 IU/L (ref 0–40)
Albumin/Globulin Ratio: 1.5 (ref 1.2–2.2)
Albumin: 4.1 g/dL (ref 3.9–4.9)
Alkaline Phosphatase: 135 IU/L — ABNORMAL HIGH (ref 44–121)
BUN/Creatinine Ratio: 3 — ABNORMAL LOW (ref 9–23)
BUN: 3 mg/dL — ABNORMAL LOW (ref 6–24)
Bilirubin Total: <0.2 mg/dL (ref 0.0–1.2)
CO2: 16 mmol/L — ABNORMAL LOW (ref 20–29)
Calcium: 9.3 mg/dL (ref 8.7–10.2)
Chloride: 99 mmol/L (ref 96–106)
Creatinine, Ser: 0.88 mg/dL (ref 0.57–1.00)
Globulin, Total: 2.8 g/dL (ref 1.5–4.5)
Glucose: 86 mg/dL (ref 70–99)
Potassium: 4.3 mmol/L (ref 3.5–5.2)
Sodium: 129 mmol/L — ABNORMAL LOW (ref 134–144)
Total Protein: 6.9 g/dL (ref 6.0–8.5)
eGFR: 84 mL/min/{1.73_m2} (ref >59)

## 2022-09-19 MED ORDER — BRIVARACETAM 100 MG PO TABS: 100.0000 mg | ORAL_TABLET | Freq: Two times a day (BID) | ORAL | 5 refills | Status: DC

## 2022-09-19 MED ORDER — BRIVARACETAM 100 MG PO TABS: 100.0000 mg | ORAL_TABLET | Freq: Two times a day (BID) | ORAL | 0 refills | Status: DC

## 2022-09-19 NOTE — Telephone Encounter (Signed)
The mail order pharmacy still hasn't been delivered yet. She isnt sure if she ha a seizure or dreamed she had a seizure, neede her medication sent to [facility] in [place] she will go get it

## 2022-09-20 ENCOUNTER — Other Ambulatory Visit: Payer: Self-pay | Admitting: Internal Medicine

## 2022-09-20 DIAGNOSIS — R748 Abnormal levels of other serum enzymes: Secondary | ICD-10-CM

## 2022-09-23 ENCOUNTER — Telehealth: Payer: Self-pay | Admitting: Internal Medicine

## 2022-09-23 ENCOUNTER — Telehealth: Payer: Self-pay | Admitting: Family Medicine

## 2022-09-23 NOTE — Telephone Encounter (Signed)
Pt called stating she thought she was supposed to have had cream called in for the itching on her feet??

## 2022-09-23 NOTE — Telephone Encounter (Signed)
Pt wants call back with lab results

## 2022-09-24 NOTE — Telephone Encounter (Signed)
Pt will return tomorrow for repeat labs recommended by Dr Court Joy

## 2022-09-25 ENCOUNTER — Other Ambulatory Visit: Payer: Self-pay | Admitting: Neurology

## 2022-09-25 ENCOUNTER — Telehealth: Payer: Self-pay | Admitting: Family Medicine

## 2022-09-25 ENCOUNTER — Other Ambulatory Visit: Payer: Self-pay | Admitting: Family Medicine

## 2022-09-25 NOTE — Telephone Encounter (Signed)
Patient aware.

## 2022-09-25 NOTE — Telephone Encounter (Signed)
Pt returning call to Katie 

## 2022-09-25 NOTE — Telephone Encounter (Signed)
LMTRC-KG

## 2022-09-25 NOTE — Telephone Encounter (Signed)
Patient aware no cream to be sent in

## 2022-09-27 ENCOUNTER — Ambulatory Visit: Payer: Medicare HMO

## 2022-09-27 ENCOUNTER — Other Ambulatory Visit: Payer: Self-pay

## 2022-09-27 MED ORDER — ZONISAMIDE 100 MG PO CAPS: ORAL_CAPSULE | ORAL | 2 refills | Status: DC

## 2022-09-30 ENCOUNTER — Ambulatory Visit (INDEPENDENT_AMBULATORY_CARE_PROVIDER_SITE_OTHER): Payer: 59 | Admitting: *Deleted

## 2022-09-30 DIAGNOSIS — Z3042 Encounter for surveillance of injectable contraceptive: Secondary | ICD-10-CM

## 2022-09-30 MED ORDER — MEDROXYPROGESTERONE ACETATE 150 MG/ML IM SUSY
150.0000 mg | PREFILLED_SYRINGE | Freq: Once | INTRAMUSCULAR | Status: AC
  Administered 2022-09-30: 150 mg via INTRAMUSCULAR

## 2022-09-30 NOTE — Progress Notes (Signed)
   NURSE VISIT- INJECTION  SUBJECTIVE:  [name] is a 42 y.o. G3P3 female here for a Depo Provera for contraception/period management. She is a GYN patient.   OBJECTIVE:  There were no vitals taken for this visit.  Appears well, in no apparent distress  Injection administered in: Left deltoid  Meds ordered this encounter  Medications   medroxyPROGESTERone Acetate SUSY 150 mg    ASSESSMENT: GYN patient Depo Provera for contraception/period management PLAN: Follow-up: in 11-13 weeks for next Depo   Levy Pupa  09/30/2022 11:02 AM

## 2022-10-04 ENCOUNTER — Telehealth: Payer: Self-pay | Admitting: Family Medicine

## 2022-10-04 NOTE — Telephone Encounter (Signed)
Pt called stating she is wanting to know if she is on or can be prescribed anxiety/depression med?  "Made comment she will try to live another day"

## 2022-10-08 ENCOUNTER — Telehealth: Payer: Self-pay

## 2022-10-08 NOTE — Telephone Encounter (Signed)
Pt needs a PA for Nurtec 75 mg

## 2022-10-13 MED ORDER — BRIVARACETAM 100 MG PO TABS: 100.0000 mg | ORAL_TABLET | Freq: Two times a day (BID) | ORAL | 5 refills | Status: DC

## 2022-10-13 NOTE — Telephone Encounter (Signed)
She has not been seen since surgery, needs f/u

## 2022-10-15 ENCOUNTER — Other Ambulatory Visit (HOSPITAL_COMMUNITY): Payer: Self-pay

## 2022-10-15 ENCOUNTER — Telehealth: Payer: Self-pay | Admitting: Internal Medicine

## 2022-10-15 ENCOUNTER — Telehealth: Payer: Self-pay

## 2022-10-15 NOTE — Telephone Encounter (Signed)
New encounter created for this authorization. Updates will be there Thank you

## 2022-10-15 NOTE — Telephone Encounter (Signed)
I received notification patient did not see MyChart message.  I called patient she is going to come for repeat labs this week, CMP and GGT.

## 2022-10-15 NOTE — Telephone Encounter (Signed)
Patient Advocate Encounter   Received notification from [place] that prior authorization is required for Nurtec 75MG  dispersible tablets   Submitted: 10-15-2022 Key [id number]  Status is pending

## 2022-10-16 ENCOUNTER — Other Ambulatory Visit (HOSPITAL_COMMUNITY): Payer: Self-pay

## 2022-10-16 ENCOUNTER — Encounter: Payer: Self-pay | Admitting: Neurology

## 2022-10-16 NOTE — Telephone Encounter (Signed)
Patient Advocate Encounter  Received a fax from [place] regarding Prior Authorization for Nurtec 75MG  dispersible tablets .   Authorization has been DENIED due to

## 2022-10-18 DIAGNOSIS — G40909 Epilepsy, unspecified, not intractable, without status epilepticus: Secondary | ICD-10-CM | POA: Diagnosis not present

## 2022-10-22 DIAGNOSIS — R748 Abnormal levels of other serum enzymes: Secondary | ICD-10-CM | POA: Diagnosis not present

## 2022-10-23 LAB — COMPREHENSIVE METABOLIC PANEL
ALT: 14 IU/L (ref 0–32)
AST: 11 IU/L (ref 0–40)
Albumin/Globulin Ratio: 1.3 (ref 1.2–2.2)
Albumin: 3.9 g/dL (ref 3.9–4.9)
Alkaline Phosphatase: 151 IU/L — ABNORMAL HIGH (ref 44–121)
BUN/Creatinine Ratio: 6 — ABNORMAL LOW (ref 9–23)
BUN: 5 mg/dL — ABNORMAL LOW (ref 6–24)
Bilirubin Total: <0.2 mg/dL (ref 0.0–1.2)
CO2: 15 mmol/L — ABNORMAL LOW (ref 20–29)
Calcium: 9.2 mg/dL (ref 8.7–10.2)
Chloride: 99 mmol/L (ref 96–106)
Creatinine, Ser: 0.84 mg/dL (ref 0.57–1.00)
Globulin, Total: 3.1 g/dL (ref 1.5–4.5)
Glucose: 99 mg/dL (ref 70–99)
Potassium: 4.5 mmol/L (ref 3.5–5.2)
Sodium: 131 mmol/L — ABNORMAL LOW (ref 134–144)
Total Protein: 7 g/dL (ref 6.0–8.5)
eGFR: 88 mL/min/{1.73_m2} (ref >59)

## 2022-10-23 LAB — GAMMA GT: GGT: 60 IU/L (ref 0–60)

## 2022-10-24 ENCOUNTER — Other Ambulatory Visit: Payer: Self-pay | Admitting: Neurology

## 2022-10-24 ENCOUNTER — Ambulatory Visit: Payer: 59 | Admitting: Family Medicine

## 2022-10-24 ENCOUNTER — Other Ambulatory Visit: Payer: Self-pay | Admitting: Internal Medicine

## 2022-10-24 DIAGNOSIS — R748 Abnormal levels of other serum enzymes: Secondary | ICD-10-CM

## 2022-10-24 NOTE — Progress Notes (Signed)
Alk phos is still mildly elevated on repeat CMP. GGT high end of normal. Discussed with patient. Labs completed fasting and she would prefer to have further workup instead of trending labs. Check RUQ ultrasound. AMA, serium calcium, PTH, and Vitamin D.

## 2022-10-28 NOTE — Telephone Encounter (Signed)
Pls let her know that her pharmacy wants her to try Emgality before they approve the Nurtec. The Emgality is a once a month medication that she injects to cut down on the migraines, it is not taken as needed for headache. If she would like to proceed, we can send Rx for starting dose (2 doses), then once a month thereafter, thanks

## 2022-10-29 MED ORDER — EMGALITY 120 MG/ML ~~LOC~~ SOAJ: 240.0000 mL | Freq: Once | SUBCUTANEOUS | 0 refills | Status: AC

## 2022-10-29 NOTE — Telephone Encounter (Signed)
Pt called no answer left a voice mail to call back. when she calls back we need to let her know that Dr Karel Jarvis stated that her pharmacy wants her to try Emgality before they approve the Nurtec. The Emgality is a once a month medication that she injects to cut down on the migraines, it is not taken as needed for headache. If she would like to proceed, we can send Rx for starting dose (2 doses), then once a month thereafter,

## 2022-10-29 NOTE — Telephone Encounter (Signed)
Pt called in an was informed pharmacy wants her to try Emgality before they approve the Nurtec. The Emgality is a once a month medication that she injects to cut down on the migraines, it is not taken as needed for headache. If she would like to proceed, we can send Rx for starting dose (2 doses), then once a month thereafter,  pt stated she would like to try it, rx needs to [facility] on [address]

## 2022-10-29 NOTE — Telephone Encounter (Signed)
Pt called no answer left a voice mail to call the office back  °

## 2022-10-30 ENCOUNTER — Encounter: Payer: Self-pay | Admitting: Family Medicine

## 2022-10-30 ENCOUNTER — Ambulatory Visit (INDEPENDENT_AMBULATORY_CARE_PROVIDER_SITE_OTHER): Payer: 59 | Admitting: Family Medicine

## 2022-10-30 ENCOUNTER — Telehealth: Payer: Self-pay | Admitting: Pharmacy Technician

## 2022-10-30 VITALS — BP 125/85 | HR 83 | Ht 61.0 in | Wt 166.0 lb

## 2022-10-30 DIAGNOSIS — F322 Major depressive disorder, single episode, severe without psychotic features: Secondary | ICD-10-CM

## 2022-10-30 DIAGNOSIS — G4489 Other headache syndrome: Secondary | ICD-10-CM | POA: Diagnosis not present

## 2022-10-30 DIAGNOSIS — G40019 Localization-related (focal) (partial) idiopathic epilepsy and epileptic syndromes with seizures of localized onset, intractable, without status epilepticus: Secondary | ICD-10-CM | POA: Diagnosis not present

## 2022-10-30 DIAGNOSIS — F17210 Nicotine dependence, cigarettes, uncomplicated: Secondary | ICD-10-CM

## 2022-10-30 DIAGNOSIS — F17200 Nicotine dependence, unspecified, uncomplicated: Secondary | ICD-10-CM

## 2022-10-30 DIAGNOSIS — B353 Tinea pedis: Secondary | ICD-10-CM

## 2022-10-30 NOTE — Telephone Encounter (Signed)
Patient Advocate Encounter  Received notification from [employer] that prior authorization for [facility] is required.   PA submitted on 4.10.24 Key [id number] Status is pending

## 2022-10-30 NOTE — Progress Notes (Signed)
   [name]     MRN: [id number]      DOB: 1980-09-28   HPI [name] is here for follow up and re-evaluation of chronic medical conditions, medication management and review of any available recent lab and radiology data.  Preventive health is updated, specifically  Cancer screening and Immunization.   Questions or concerns regarding consultations or procedures which the PT has had in the interim are  addressed. The PT denies any adverse reactions to current medications since the last visit.  Currently on augmentin for infected tooth, need to refill terbinafine, rash on foot improving States migraine is not responding to meds will be started on once weekly med Reports still smoking and really wants to quit Has remained seizure free since surgery Still c/o noise and disturbance from residents who live above her apt, wopuld like to [facility] or would like tem to move ROS Denies recent fever or chills. Denies sinus pressure, nasal congestion, ear pain or sore throat. Denies chest congestion, productive cough or wheezing. Denies chest pains, palpitations and leg swelling Denies abdominal pain, nausea, vomiting,diarrhea or constipation.   Denies dysuria, frequency, hesitancy or incontinence. Denies joint pain, swelling and limitation in mobility. Still c/o rash on foot, has not filled rx   PE  BP 125/85 (BP Location: Right Arm, Patient Position: Sitting, Cuff Size: Normal)   Pulse 83   Ht 5\' 1"  (1.549 m)   Wt 166 lb (75.3 kg)   SpO2 97%   BMI 31.37 kg/m   Patient alert and oriented and in no cardiopulmonary distress.  HEENT: No facial asymmetry, EOMI,     Neck supple .  Chest: Clear to auscultation bilaterally.  CVS: S1, S2 no murmurs, no S3.Regular rate.  ABD: Soft non tender.   Ext: No edema  MS: Adequate ROM spine, shoulders, hips and knees.  Psych: Good eye contact,not anxious or depressed appearing.  CNS: CN 2-12 intact, power,  normal throughout.no focal deficits  noted.   Assessment & Plan  Localization-related (focal) (partial) idiopathic epilepsy and epileptic syndromes with seizures of localized onset, intractable, without status epilepticus (HCC) Reports being seizure fee since recent surgery  Tinea pedis of both feet Needs to take med prescribed, reports not taking and no change  Current smoker Asked:confirms currently smokes cigarettes Assess: Unwilling to set a quit date, but is cutting back and wants to quit Advise: needs to QUIT to reduce risk of cancer, cardio and cerebrovascular disease Assist: counseled for 5 minutes and literature provided Arrange: follow up in 2 to 4 months   Depression, major, single episode, severe (HCC) Resolved and on no medication currently  Headache Followed by Neurology, reports still uncontrolled though less frequent

## 2022-10-30 NOTE — Telephone Encounter (Signed)
ERROR

## 2022-10-30 NOTE — Patient Instructions (Addendum)
Annual exam in 4 months, call if you need me sooner  Please schedule mammogram at checkout  Fill and continue terbinafine tab for total of 3 months  NO smoking, start back on the lozengers, call [id number]  Get [place] at hospital next week Tuesday, April 16,need to go to the X ray dept at [facility] need TdAP , please get at pharmacy  Glad no more seizures  F/U headaches with neurology  Thanks for choosing [facility], we consider it a privelige to serve you.

## 2022-10-31 ENCOUNTER — Other Ambulatory Visit (HOSPITAL_COMMUNITY): Payer: Self-pay

## 2022-11-04 ENCOUNTER — Encounter: Payer: Self-pay | Admitting: Family Medicine

## 2022-11-04 NOTE — Assessment & Plan Note (Signed)
Reports being seizure fee since recent surgery

## 2022-11-04 NOTE — Assessment & Plan Note (Signed)
Followed by Neurology, reports still uncontrolled though less frequent

## 2022-11-04 NOTE — Assessment & Plan Note (Signed)
Needs to take med prescribed, reports not taking and no change

## 2022-11-04 NOTE — Assessment & Plan Note (Signed)
Asked:confirms currently smokes cigarettes ?Assess: Unwilling to set a quit date, but is cutting back and wants to quit ?Advise: needs to QUIT to reduce risk of cancer, cardio and cerebrovascular disease ?Assist: counseled for 5 minutes and literature provided ?Arrange: follow up in 2 to 4 months ? ?

## 2022-11-04 NOTE — Assessment & Plan Note (Signed)
Resolved and on no medication currently 

## 2022-11-05 ENCOUNTER — Ambulatory Visit (HOSPITAL_COMMUNITY): Payer: 59 | Attending: Internal Medicine

## 2022-11-06 ENCOUNTER — Ambulatory Visit (HOSPITAL_COMMUNITY)
Admission: RE | Admit: 2022-11-06 | Discharge: 2022-11-06 | Disposition: A | Discharge status: Home / Self Care | Payer: 59 | Source: Ambulatory Visit | Attending: Family Medicine | Admitting: Family Medicine

## 2022-11-06 ENCOUNTER — Encounter (HOSPITAL_COMMUNITY): Payer: Self-pay

## 2022-11-06 DIAGNOSIS — Z1231 Encounter for screening mammogram for malignant neoplasm of breast: Secondary | ICD-10-CM

## 2022-11-11 ENCOUNTER — Ambulatory Visit (INDEPENDENT_AMBULATORY_CARE_PROVIDER_SITE_OTHER): Payer: 59 | Admitting: Internal Medicine

## 2022-11-11 ENCOUNTER — Encounter: Payer: Self-pay | Admitting: Internal Medicine

## 2022-11-11 VITALS — BP 122/83 | HR 77 | Resp 16 | Ht 62.0 in | Wt 166.1 lb

## 2022-11-11 DIAGNOSIS — R748 Abnormal levels of other serum enzymes: Secondary | ICD-10-CM

## 2022-11-11 DIAGNOSIS — B356 Tinea cruris: Secondary | ICD-10-CM

## 2022-11-11 MED ORDER — TRIAMCINOLONE ACETONIDE 0.025 % EX OINT
1.0000 | TOPICAL_OINTMENT | Freq: Two times a day (BID) | CUTANEOUS | 0 refills | Status: AC | PRN
Start: 2022-11-11 — End: ?

## 2022-11-11 NOTE — Assessment & Plan Note (Signed)
Patient needs to complete lab work ordered last visit and RUQ ultrasound.

## 2022-11-11 NOTE — Patient Instructions (Signed)
Thank you, Ms.[name] for allowing [place] to provide your care today.   Take Terbinafine daily.  Use steroid cream for uncontrolled itching Come back in 6 weeks for follow up or sooner if worsening.   Go by lab.    Thurmon Fair, M.D.

## 2022-11-11 NOTE — Assessment & Plan Note (Signed)
Itching on inner thighs had improved until a few days ago. The pilling skin on her feet is also improving. She has been taking terbinafine as prescribed. No recent outbreaks of HSV2.   Tinea improving, started terbinafine on 2/28. I am unsure why patient is itching more in the last 2 days.  Review of notes showed patient possibly had stopped medication during this time. Will continue for 12 week course to treat tinea pedis and continue to treat tinea cruris. Will give a low dose steroid cream to help with itching. Follow up in 6 weeks or sooner if worsening.

## 2022-11-11 NOTE — Progress Notes (Signed)
   HPI:[name] is a 42 y.o. female who presents for evaluation of Vaginal Itching (Still having itching and irritation in vaginal area ) . For the details of today's visit, please refer to the assessment and plan.  Physical Exam: Vitals:   11/11/22 1306  BP: 122/83  Pulse: 77  Resp: 16  SpO2: 97%  Weight: 166 lb 1.9 oz (75.4 kg)  Height:  (1.575 m)     Physical Exam Vitals reviewed. Exam conducted with a chaperone present.  Constitutional:      General: She is not in acute distress.    Appearance: She is not ill-appearing.  Cardiovascular:     Rate and Rhythm: Normal rate and regular rhythm.  Genitourinary:    Exam position: Lithotomy position.     Pubic Area: No rash or pubic lice.      Labia:        Right: No rash.        Left: No rash.      Urethra: No urethral lesion.  Feet:     Right foot:     Skin integrity: No skin breakdown, erythema, dry skin or fissure.     Left foot:     Skin integrity: No skin breakdown, erythema, dry skin or fissure.  Skin:    Findings: No erythema or rash.      Assessment & Plan:   [name] was seen today for vaginal itching.  Tinea cruris Assessment & Plan: Itching on inner thighs had improved until a few days ago. The pilling skin on her feet is also improving. She has been taking terbinafine as prescribed. No recent outbreaks of HSV2.   Tinea improving, started terbinafine on 2/28. I am unsure why patient is itching more in the last 2 days.  Review of notes showed patient possibly had stopped medication during this time. Will continue for 12 week course to treat tinea pedis and continue to treat tinea cruris. Will give a low dose steroid cream to help with itching. Follow up in 6 weeks or sooner if worsening.   Orders: -     Triamcinolone Acetonide; Apply 1 Application topically 2 (two) times daily as needed (itching).  Dispense: 30 g; Refill: 0 -     CMP14+EGFR  Elevated alkaline phosphatase level Assessment &  Plan: Patient needs to complete lab work ordered last visit and RUQ ultrasound.           Milus Banister, MD

## 2022-11-11 NOTE — Telephone Encounter (Signed)
PA Approved

## 2022-11-12 ENCOUNTER — Ambulatory Visit (HOSPITAL_COMMUNITY): Payer: 59 | Admitting: Psychiatry

## 2022-11-12 ENCOUNTER — Encounter (HOSPITAL_COMMUNITY): Payer: Self-pay

## 2022-11-12 LAB — CMP14+EGFR
ALT: 17 IU/L (ref 0–32)
AST: 20 IU/L (ref 0–40)
Albumin/Globulin Ratio: 1.3 (ref 1.2–2.2)
Albumin: 3.8 g/dL — ABNORMAL LOW (ref 3.9–4.9)
Alkaline Phosphatase: 140 IU/L — ABNORMAL HIGH (ref 44–121)
BUN/Creatinine Ratio: 8 — ABNORMAL LOW (ref 9–23)
BUN: 6 mg/dL (ref 6–24)
Bilirubin Total: <0.2 mg/dL (ref 0.0–1.2)
CO2: 17 mmol/L — ABNORMAL LOW (ref 20–29)
Calcium: 9 mg/dL (ref 8.7–10.2)
Chloride: 92 mmol/L — ABNORMAL LOW (ref 96–106)
Creatinine, Ser: 0.73 mg/dL (ref 0.57–1.00)
Globulin, Total: 2.9 g/dL (ref 1.5–4.5)
Glucose: 83 mg/dL (ref 70–99)
Potassium: 5 mmol/L (ref 3.5–5.2)
Sodium: 123 mmol/L — ABNORMAL LOW (ref 134–144)
Total Protein: 6.7 g/dL (ref 6.0–8.5)
eGFR: 105 mL/min/{1.73_m2} (ref >59)

## 2022-11-13 ENCOUNTER — Telehealth: Payer: Self-pay | Admitting: Neurology

## 2022-11-13 DIAGNOSIS — Z79899 Other long term (current) drug therapy: Secondary | ICD-10-CM

## 2022-11-13 NOTE — Telephone Encounter (Signed)
New message    Patient asking for a call back on tomorrow to discuss seizure medication

## 2022-11-14 NOTE — Telephone Encounter (Signed)
Pt called she said PCP did lab work results are in epic her sodium is low, she was advised to call [facility] but she called our office 1st I told her to call [facility] as well she has a follow up with [facility] on May 14th

## 2022-11-18 NOTE — Telephone Encounter (Signed)
Pls let her know to f/u with [facility], but in the meantime we can reduce the oxcarbazepine a little since this can cause low sodium levels. Pls confirm how she is taking it, is she taking 1 tab in AM, 2 tabs in PM still? If yes, reduce to 1 tab in AM, 1 and 1/2 tabs in PM. Recheck sodium in 1 week. thanks

## 2022-11-18 NOTE — Telephone Encounter (Signed)
Pt called left a voice mail to call the office back  ?

## 2022-11-18 NOTE — Telephone Encounter (Signed)
Pt called in returning Heather's call 

## 2022-11-19 ENCOUNTER — Other Ambulatory Visit: Payer: Self-pay

## 2022-11-19 DIAGNOSIS — Z79899 Other long term (current) drug therapy: Secondary | ICD-10-CM

## 2022-11-19 NOTE — Telephone Encounter (Signed)
Pt called informed f/u with [facility], but in the meantime we can reduce the oxcarbazepine a little since this can cause low sodium levels. Pls confirm how she is taking it,  she taking 1 tab in AM, 2 tabs in PM still? If yes, reduce to 1 tab in AM, 1 and 1/2 tabs in PM. Pt made a note of medication change Recheck sodium in 1 week will mail her lad order so she can go to [place] to get lab word done

## 2022-11-22 ENCOUNTER — Other Ambulatory Visit: Payer: Self-pay | Admitting: Neurology

## 2022-11-28 ENCOUNTER — Telehealth: Payer: Self-pay

## 2022-11-28 NOTE — Telephone Encounter (Signed)
PA request received via [facility] for Briviact 100MG  tablets  PA not submitted due to question of previous trial and failure of alternatives; submitting to clinical pool   Key: [id number]

## 2022-12-10 ENCOUNTER — Telehealth: Payer: Self-pay | Admitting: Neurology

## 2022-12-10 NOTE — Telephone Encounter (Signed)
Pt called informed to take her oxcrbazepine 1 tab in AM, 1 and 1/2 tabs in PM

## 2022-12-10 NOTE — Telephone Encounter (Signed)
Pt called in wanting to double check to see when she is supposed to be taking her oxcarbazepine?

## 2022-12-11 NOTE — Telephone Encounter (Signed)
PA resubmitted with additional information of previous trials.  New Key: [id number]

## 2022-12-13 ENCOUNTER — Telehealth: Payer: Self-pay | Admitting: Pharmacy Technician

## 2022-12-13 ENCOUNTER — Other Ambulatory Visit: Payer: Self-pay

## 2022-12-13 ENCOUNTER — Other Ambulatory Visit (HOSPITAL_COMMUNITY): Payer: Self-pay

## 2022-12-13 MED ORDER — BRIVARACETAM 100 MG PO TABS: 100.0000 mg | ORAL_TABLET | Freq: Two times a day (BID) | ORAL | 5 refills | Status: DC

## 2022-12-13 NOTE — Telephone Encounter (Signed)
Patient Advocate Encounter  Received notification from [facility] that prior authorization for BRIVIACT 100MG  is required.   PA submitted on 5.24.24 EXPEDITED Key [id number] Status is pending

## 2022-12-13 NOTE — Telephone Encounter (Signed)
Pharmacy Patient Advocate Encounter  Received notification from [facility] that the request for prior authorization for BRIVIACT 100MG  has been denied due to NOT MEETING CRITERI.    Patient did meet criteria but they did not read the supporting documentation. Resubmitted as EXPEDITED. Heather (LPN)  made aware.

## 2022-12-13 NOTE — Telephone Encounter (Signed)
Patient Advocate Encounter  Prior Authorization for BRIVIACT has been approved with [employer].    PA# [id number] Effective dates: 5.24.24 through 5.24.25  Per [facility] test claim, copay for 30 days supply is $10

## 2022-12-17 ENCOUNTER — Other Ambulatory Visit (HOSPITAL_COMMUNITY): Payer: Self-pay

## 2022-12-23 ENCOUNTER — Ambulatory Visit (INDEPENDENT_AMBULATORY_CARE_PROVIDER_SITE_OTHER): Payer: 59 | Admitting: *Deleted

## 2022-12-23 DIAGNOSIS — Z3042 Encounter for surveillance of injectable contraceptive: Secondary | ICD-10-CM

## 2022-12-23 MED ORDER — MEDROXYPROGESTERONE ACETATE 150 MG/ML IM SUSP
150.0000 mg | Freq: Once | INTRAMUSCULAR | Status: AC
Start: 2022-12-23 — End: 2022-12-23
  Administered 2022-12-23: 150 mg via INTRAMUSCULAR

## 2022-12-23 NOTE — Progress Notes (Signed)
   NURSE VISIT- INJECTION  SUBJECTIVE:  [name] is a 42 y.o. G3P3 female here for a Depo Provera for contraception/period management. She is a GYN patient.   OBJECTIVE:  There were no vitals taken for this visit.  Appears well, in no apparent distress  Injection administered in: Right deltoid  Meds ordered this encounter  Medications   medroxyPROGESTERone (DEPO-PROVERA) injection 150 mg    ASSESSMENT: GYN patient Depo Provera for contraception/period management PLAN: Follow-up: in 11-13 weeks for next Depo   Jobe Marker  12/23/2022 11:30 AM

## 2022-12-24 ENCOUNTER — Ambulatory Visit: Payer: 59 | Admitting: Family Medicine

## 2023-01-02 NOTE — Telephone Encounter (Signed)
PA has been approved from 12/13/2022-12/12/2023

## 2023-01-07 ENCOUNTER — Telehealth: Payer: Self-pay | Admitting: Neurology

## 2023-01-07 ENCOUNTER — Other Ambulatory Visit: Payer: Self-pay | Admitting: Neurology

## 2023-01-07 NOTE — Telephone Encounter (Signed)
Pt is calling in stating that she is having major migraines and it is on the L side which she had her brain surgery January of this year.  Pt stated that she does not want to go to the ED b/c of the wait time there.  She stated that she had one yesterday and again today.  Pt would like to have a call back.

## 2023-01-07 NOTE — Telephone Encounter (Signed)
Pls let her know that I have not seen her since July 2023, not since she had her brain surgery. We will need to schedule a f/u to discuss. I do see that she has a f/u with her neurosurgeon next week, can discuss HAs with him as well.

## 2023-01-07 NOTE — Telephone Encounter (Signed)
Patient wants dr.aquino know she has been having headaches

## 2023-01-08 NOTE — Telephone Encounter (Signed)
See other phone note

## 2023-01-08 NOTE — Telephone Encounter (Signed)
[facility] is going to see [facility] next week and she is scheduled to see Dr Karel Jarvis July 1st for a follow up

## 2023-01-09 ENCOUNTER — Encounter (HOSPITAL_COMMUNITY): Payer: Self-pay | Admitting: Emergency Medicine

## 2023-01-09 ENCOUNTER — Ambulatory Visit: Payer: 59 | Admitting: Family Medicine

## 2023-01-09 ENCOUNTER — Emergency Department (HOSPITAL_COMMUNITY)
Admission: EM | Admit: 2023-01-09 | Discharge: 2023-01-09 | Disposition: A | Discharge status: Home / Self Care | Payer: 59 | Attending: Emergency Medicine | Admitting: Emergency Medicine

## 2023-01-09 ENCOUNTER — Other Ambulatory Visit: Payer: Self-pay

## 2023-01-09 ENCOUNTER — Emergency Department (HOSPITAL_COMMUNITY): Payer: 59

## 2023-01-09 DIAGNOSIS — Z5329 Procedure and treatment not carried out because of patient's decision for other reasons: Secondary | ICD-10-CM | POA: Diagnosis not present

## 2023-01-09 DIAGNOSIS — E871 Hypo-osmolality and hyponatremia: Secondary | ICD-10-CM | POA: Diagnosis not present

## 2023-01-09 DIAGNOSIS — G9389 Other specified disorders of brain: Secondary | ICD-10-CM | POA: Diagnosis not present

## 2023-01-09 DIAGNOSIS — R569 Unspecified convulsions: Secondary | ICD-10-CM | POA: Diagnosis not present

## 2023-01-09 DIAGNOSIS — Z743 Need for continuous supervision: Secondary | ICD-10-CM | POA: Diagnosis not present

## 2023-01-09 LAB — CBC WITH DIFFERENTIAL/PLATELET
Abs Immature Granulocytes: 0.01 10*3/uL (ref 0.00–0.07)
Basophils Absolute: 0 10*3/uL (ref 0.0–0.1)
Basophils Relative: 1 %
Eosinophils Absolute: 0.1 10*3/uL (ref 0.0–0.5)
Eosinophils Relative: 3 %
HCT: 32.9 % — ABNORMAL LOW (ref 36.0–46.0)
Hemoglobin: 10.9 g/dL — ABNORMAL LOW (ref 12.0–15.0)
Immature Granulocytes: 0 %
Lymphocytes Relative: 42 %
Lymphs Abs: 1.9 10*3/uL (ref 0.7–4.0)
MCH: 31.1 pg (ref 26.0–34.0)
MCHC: 33.1 g/dL (ref 30.0–36.0)
MCV: 94 fL (ref 80.0–100.0)
Monocytes Absolute: 0.5 10*3/uL (ref 0.1–1.0)
Monocytes Relative: 11 %
Neutro Abs: 1.9 10*3/uL (ref 1.7–7.7)
Neutrophils Relative %: 43 %
Platelets: 337 10*3/uL (ref 150–400)
RBC: 3.5 MIL/uL — ABNORMAL LOW (ref 3.87–5.11)
RDW: 17.3 % — ABNORMAL HIGH (ref 11.5–15.5)
WBC: 4.4 10*3/uL (ref 4.0–10.5)
nRBC: 0 % (ref 0.0–0.2)

## 2023-01-09 LAB — COMPREHENSIVE METABOLIC PANEL
ALT: 13 U/L (ref 0–44)
AST: 15 U/L (ref 15–41)
Albumin: 3.2 g/dL — ABNORMAL LOW (ref 3.5–5.0)
Alkaline Phosphatase: 105 U/L (ref 38–126)
Anion gap: 9 (ref 5–15)
BUN: 9 mg/dL (ref 6–20)
CO2: 14 mmol/L — ABNORMAL LOW (ref 22–32)
Calcium: 8 mg/dL — ABNORMAL LOW (ref 8.9–10.3)
Chloride: 100 mmol/L (ref 98–111)
Creatinine, Ser: 1.12 mg/dL — ABNORMAL HIGH (ref 0.44–1.00)
GFR, Estimated: >60 mL/min (ref >60)
Glucose, Bld: 108 mg/dL — ABNORMAL HIGH (ref 70–99)
Potassium: 3.7 mmol/L (ref 3.5–5.1)
Sodium: 123 mmol/L — ABNORMAL LOW (ref 135–145)
Total Bilirubin: 0.3 mg/dL (ref 0.3–1.2)
Total Protein: 6.7 g/dL (ref 6.5–8.1)

## 2023-01-09 MED ORDER — BRIVARACETAM 100 MG PO TABS
100.0000 mg | ORAL_TABLET | Freq: Once | ORAL | Status: AC
  Administered 2023-01-09: 100 mg via ORAL
  Filled 2023-01-09: qty 1

## 2023-01-09 NOTE — ED Provider Notes (Signed)
[facility] AT [facility] Provider Note   CSN: [id number] Arrival date & time: 01/09/23  1554     History  Chief Complaint  Patient presents with   Seizures    [name] is a 42 y.o. female.  Patient has a history of seizures and had a seizure today.  She is out of her Brivact  The history is provided by the patient and medical records. A language interpreter was used.  Seizures Seizure activity on arrival: yes   Seizure type:  Grand mal Preceding symptoms: no sensation of an aura present   Initial focality:  None Episode characteristics: abnormal movements   Postictal symptoms: confusion   Return to baseline: yes   Severity:  Moderate Timing:  Once Progression:  Resolved Context: not alcohol withdrawal        Home Medications Prior to Admission medications   Medication Sig Start Date End Date Taking? Authorizing Provider  amoxicillin (AMOXIL) 875 MG tablet SMARTSIG:1 Tablet(s) By Mouth Every 12 Hours 10/22/22   [provider]  brivaracetam (BRIVIACT) 100 MG TABS tablet Take 1 tablet (100 mg total) by mouth 2 (two) times daily. 12/13/22   Van Clines, MD  famciclovir [facility]) 250 MG tablet TAKE 1 TABLET BY MOUTH TWICE DAILY 09/26/22   Kerri Perches, MD  ibuprofen (ADVIL) 800 MG tablet Take by mouth. 10/22/22   [provider]  medroxyPROGESTERone (DEPO-PROVERA) 150 MG/ML injection Inject 1 mL (150 mg total) into the muscle every 3 (three) months. 07/09/22   Adline Potter, NP  nortriptyline (PAMELOR) 25 MG capsule TAKE 1 CAPSULE BY MOUTH AT BEDTIME 10/25/22   Van Clines, MD  oxcarbazepine (TRILEPTAL) 600 MG tablet TAKE 1 tab in AM, 1 and 1/2 tabs in PM. 11/25/22   Van Clines, MD  terbinafine (LAMISIL) 250 MG tablet Take 1 tablet (250 mg total) by mouth daily. 09/18/22   Gardenia Phlegm, MD  tiZANidine (ZANAFLEX) 4 MG tablet TAKE 1 TABLET BY MOUTH EVERY 6 HOURS AS NEEDED FOR MUSCLE SPASMS 11/25/22    Van Clines, MD  triamcinolone (KENALOG) 0.025 % ointment Apply 1 Application topically 2 (two) times daily as needed (itching). 11/11/22   Gardenia Phlegm, MD  Ubrogepant (UBRELVY) 100 MG TABS Take 1 tablet as needed at onset of migraine. Do not take more than 3 a week Patient not taking: Reported on 10/30/2022 02/14/22   Van Clines, MD  zonisamide (ZONEGRAN) 100 MG capsule TAKE ONE (1) CAPSULE BY MOUTH ONCE DAILY IN THE MORNING AND TAKE FIVE (5) CAPSULES IN THE EVENING 09/27/22   Van Clines, MD      Allergies    Morphine    Review of Systems   Review of Systems  Constitutional:  Negative for appetite change and fatigue.  HENT:  Negative for congestion, ear discharge and sinus pressure.   Eyes:  Negative for discharge.  Respiratory:  Negative for cough.   Cardiovascular:  Negative for chest pain.  Gastrointestinal:  Negative for abdominal pain and diarrhea.  Genitourinary:  Negative for frequency and hematuria.  Musculoskeletal:  Negative for back pain.  Skin:  Negative for rash.  Neurological:  Positive for seizures. Negative for headaches.  Psychiatric/Behavioral:  Negative for hallucinations.     Physical Exam Updated Vital Signs BP 112/82   Pulse 97   Temp 98.2 F (36.8 C)   Resp 18   Ht 5\' 2"  (1.575 m)   Wt 73.9 kg  SpO2 98%   BMI 29.81 kg/m  Physical Exam Vitals and nursing note reviewed.  Constitutional:      Appearance: She is well-developed.  HENT:     Head: Normocephalic.     Nose: Nose normal.  Eyes:     General: No scleral icterus.    Conjunctiva/sclera: Conjunctivae normal.  Neck:     Thyroid: No thyromegaly.  Cardiovascular:     Rate and Rhythm: Normal rate and regular rhythm.     Heart sounds: No murmur heard.    No friction rub. No gallop.  Pulmonary:     Breath sounds: No stridor. No wheezing or rales.  Chest:     Chest wall: No tenderness.  Abdominal:     General: There is no distension.     Tenderness: There is no abdominal  tenderness. There is no rebound.  Musculoskeletal:        General: Normal range of motion.     Cervical back: Neck supple.  Lymphadenopathy:     Cervical: No cervical adenopathy.  Skin:    Findings: No erythema or rash.  Neurological:     Mental Status: She is alert and oriented to person, place, and time.     Motor: No abnormal muscle tone.     Coordination: Coordination normal.  Psychiatric:        Behavior: Behavior normal.     ED Results / Procedures / Treatments   Labs (all labs ordered are listed, but only abnormal results are displayed) Labs Reviewed  COMPREHENSIVE METABOLIC PANEL - Abnormal; Notable for the following components:      Result Value   Sodium 123 (*)    CO2 14 (*)    Glucose, Bld 108 (*)    Creatinine, Ser 1.12 (*)    Calcium 8.0 (*)    Albumin 3.2 (*)    All other components within normal limits  CBC WITH DIFFERENTIAL/PLATELET - Abnormal; Notable for the following components:   RBC 3.50 (*)    Hemoglobin 10.9 (*)    HCT 32.9 (*)    RDW 17.3 (*)    All other components within normal limits  CBG MONITORING, ED    EKG None  Radiology CT Head Wo Contrast  Result Date: 01/09/2023 CLINICAL DATA:  History of brain surgery seizure EXAM: CT HEAD WITHOUT CONTRAST TECHNIQUE: Contiguous axial images were obtained from the base of the skull through the vertex without intravenous contrast. RADIATION DOSE REDUCTION: This exam was performed according to the departmental dose-optimization program which includes automated exposure control, adjustment of the mA and/or kV according to patient size and/or use of iterative reconstruction technique. COMPARISON:  CT brain 01/14/2019 FINDINGS: Brain: No acute territorial infarction, hemorrhage or intracranial mass. Interval encephalomalacia at the anterior left temporal lobe. Minimal ex vacuo dilatation of left temporal horn. Ventricles nonenlarged Vascular: No hyperdense vessels. Mild carotid vascular calcification Skull:  No fracture.  Interval left temporal craniotomy. Sinuses/Orbits: No acute finding. Other: None IMPRESSION: 1. No CT evidence for acute intracranial abnormality. 2. Interval left temporal craniotomy with encephalomalacia at the anterior left temporal lobe. Electronically Signed   By: Jasmine Pang M.D.   On: 01/09/2023 17:27    Procedures Procedures    Medications Ordered in ED Medications  brivaracetam (BRIVIACT) tablet 100 mg (100 mg Oral Given 01/09/23 1727)    ED Course/ Medical Decision Making/ A&P  CRITICAL CARE Performed by: Bethann Berkshire Total critical care time: 45 minutes Critical care time was exclusive of separately billable procedures  and treating other patients. Critical care was necessary to treat or prevent imminent or life-threatening deterioration. Critical care was time spent personally by me on the following activities: development of treatment plan with patient and/or surrogate as well as nursing, discussions with consultants, evaluation of patient's response to treatment, examination of patient, obtaining history from patient or surrogate, ordering and performing treatments and interventions, ordering and review of laboratory studies, ordering and review of radiographic studies, pulse oximetry and re-evaluation of patient's condition.                                 Medical Decision Making Amount and/or Complexity of Data Reviewed Labs: ordered. Radiology: ordered.  Risk Prescription drug management.  This patient presents to the ED for concern of seizure, this involves an extensive number of treatment options, and is a complaint that carries with it a high risk of complications and morbidity.  The differential diagnosis includes seizure secondary to lack of seizure medicine or infection   Co morbidities that complicate the patient evaluation  Seizure years   Additional history obtained:  Additional history obtained from patient External records from  outside source obtained and reviewed including hospital records   Lab Tests:  I Ordered, and personally interpreted labs.  The pertinent results include: Sodium 123   Imaging Studies ordered:  I ordered imaging studies including CT head I independently visualized and interpreted imaging which showed negative I agree with the radiologist interpretation   Cardiac Monitoring: / EKG:  The patient was maintained on a cardiac monitor.  I personally viewed and interpreted the cardiac monitored which showed an underlying rhythm of: Normal sinus rhythm   Consultations Obtained:  No consultant   Problem List / ED Course / Critical interventions / Medication management  Seizures I ordered medication including Brivact Reevaluation of the patient after these medicines showed that the patient improved I have reviewed the patients home medicines and have made adjustments as needed   Social Determinants of Health:  none   Test / Admission - Considered:  None  Seizure and hyponatremia.  Patient left AMA        Final Clinical Impression(s) / ED Diagnoses Final diagnoses:  Seizure (HCC)  Hyponatremia    Rx / DC Orders ED Discharge Orders     None         Bethann Berkshire, MD 01/10/23 1159

## 2023-01-09 NOTE — ED Triage Notes (Signed)
Pt to ER via EMS form home with c/o seizures.  Pt has hx of same. Family reports "brain surgery" in January.  Reports tonic/clonic activity while walking, was lowered to floor by family member.  Pt was post-ictal on EMS arrival, arrives to ER more alert, but remains groggy.  Answers most questions appropriately.

## 2023-01-09 NOTE — Discharge Instructions (Signed)
Follow-up with your neurologist in the next few weeks.  Make sure you take all your seizure medicines.  You should see your family doctor to check your salt level which is very low

## 2023-01-10 ENCOUNTER — Telehealth: Payer: Self-pay | Admitting: Internal Medicine

## 2023-01-10 ENCOUNTER — Other Ambulatory Visit (HOSPITAL_COMMUNITY): Payer: Self-pay

## 2023-01-10 ENCOUNTER — Other Ambulatory Visit: Payer: Self-pay | Admitting: Neurology

## 2023-01-10 MED ORDER — BRIVARACETAM 100 MG PO TABS: 100.0000 mg | ORAL_TABLET | Freq: Two times a day (BID) | ORAL | 5 refills | Status: DC

## 2023-01-10 MED ORDER — BRIVARACETAM 100 MG PO TABS: 100.0000 mg | ORAL_TABLET | Freq: Two times a day (BID) | ORAL | 3 refills | Status: DC

## 2023-01-10 NOTE — Telephone Encounter (Signed)
Pt is calling in stating that she went to the hospital due to having a seizure and is calling in stating that she needs to know the name of the medication that she was told to cut in half by our office.  Pt was asked to stay at the hospital but she declined to do so.  Pt would like to have a call back.

## 2023-01-10 NOTE — Telephone Encounter (Signed)
Prescription Request  01/10/2023  LOV: 11/11/2022  What is the name of the medication or equipment? terbinafine (LAMISIL) 250 MG tablet [[id number]   Have you contacted your pharmacy to request a refill? No   Which pharmacy would you like this sent to?  [id number] - [place], [place] - [address] [place] [address] Phone: [phone] Fax: [phone]  [facility] [place], [place] - [address] [place] [address] Phone: [phone] Fax: [phone]    Patient notified that their request is being sent to the clinical staff for review and that they should receive a response within 2 business days.   Please advise at Mobile [phone] (mobile)

## 2023-01-10 NOTE — Telephone Encounter (Addendum)
Pt c/o: seizure Missed medications?  Yes.   Hasn't had her briviact in 2 months  Sleep deprived?  No. Alcohol intake?  No. Increased stress? No. Any trigger? Headache  Any change in medication color or shape? No. Back to their usual baseline self?  Yes.  . If no, advise go to ER Current medications prescribed by Dr. Karel Jarvis:  brivaracetam (BRIVIACT) 100 MG TABS tablet Take 1 tablet (100 mg total) by mouth 2 (two) times daily  zonisamide (ZONEGRAN) 100 MG capsule TAKE ONE (1) CAPSULE BY MOUTH ONCE DAILY IN THE MORNING AND TAKE FIVE (5) CAPSULES IN THE EVENING  oxcarbazepine (TRILEPTAL) 600 MG tablet TAKE 1 tab in AM, 1 and 1/2 tabs in PM   Hospital wanted her to stay but she didn't stay  Pt called informed to take her oxcrbazepine 1 tab in AM, 1 and 1/2 tabs in PM     Brivicact needs to be sent to [facility] so her aide can get it today and to [facility] so it can go in her pill pack

## 2023-01-10 NOTE — Addendum Note (Signed)
Addended by: Dimas Chyle on: 01/10/2023 09:30 AM   Modules accepted: Orders

## 2023-01-13 ENCOUNTER — Other Ambulatory Visit: Payer: Self-pay

## 2023-01-13 DIAGNOSIS — B351 Tinea unguium: Secondary | ICD-10-CM

## 2023-01-13 DIAGNOSIS — B353 Tinea pedis: Secondary | ICD-10-CM

## 2023-01-13 DIAGNOSIS — B356 Tinea cruris: Secondary | ICD-10-CM

## 2023-01-13 MED ORDER — TERBINAFINE HCL 250 MG PO TABS
250.0000 mg | ORAL_TABLET | Freq: Every day | ORAL | 0 refills | Status: AC
Start: 2023-01-13 — End: ?

## 2023-01-13 NOTE — Telephone Encounter (Signed)
Refill sent.

## 2023-01-20 ENCOUNTER — Encounter: Payer: Self-pay | Admitting: Neurology

## 2023-01-20 ENCOUNTER — Other Ambulatory Visit: Payer: 59

## 2023-01-20 ENCOUNTER — Ambulatory Visit (INDEPENDENT_AMBULATORY_CARE_PROVIDER_SITE_OTHER): Payer: 59 | Admitting: Neurology

## 2023-01-20 VITALS — BP 144/86 | HR 82 | Ht 62.0 in | Wt 172.4 lb

## 2023-01-20 DIAGNOSIS — E871 Hypo-osmolality and hyponatremia: Secondary | ICD-10-CM

## 2023-01-20 DIAGNOSIS — G43009 Migraine without aura, not intractable, without status migrainosus: Secondary | ICD-10-CM | POA: Diagnosis not present

## 2023-01-20 DIAGNOSIS — G40019 Localization-related (focal) (partial) idiopathic epilepsy and epileptic syndromes with seizures of localized onset, intractable, without status epilepticus: Secondary | ICD-10-CM

## 2023-01-20 MED ORDER — UBRELVY 100 MG PO TABS: ORAL_TABLET | ORAL | 5 refills | Status: DC

## 2023-01-20 MED ORDER — EMGALITY 120 MG/ML ~~LOC~~ SOAJ: 1.0000 "pen " | SUBCUTANEOUS | 11 refills | Status: DC

## 2023-01-20 MED ORDER — NORTRIPTYLINE HCL 25 MG PO CAPS: ORAL_CAPSULE | ORAL | 3 refills | Status: DC

## 2023-01-20 MED ORDER — BRIVARACETAM 100 MG PO TABS: 100.0000 mg | ORAL_TABLET | Freq: Two times a day (BID) | ORAL | 3 refills | Status: DC

## 2023-01-20 MED ORDER — OXCARBAZEPINE 600 MG PO TABS: ORAL_TABLET | ORAL | 3 refills | Status: DC

## 2023-01-20 MED ORDER — ZONISAMIDE 100 MG PO CAPS: ORAL_CAPSULE | ORAL | 3 refills | Status: DC

## 2023-01-20 MED ORDER — NORTRIPTYLINE HCL 25 MG PO CAPS: ORAL_CAPSULE | ORAL | 0 refills | Status: DC

## 2023-01-20 NOTE — Progress Notes (Signed)
Medication Samples have been provided to the patient.  Drug name: emgality       Strength: 120mg /ml        Qty: 1 box  LOT: [id number]  Exp.Date: 08/25/24  Dosing instructions: loading dose 2 injections  The patient has been instructed regarding the correct time, dose, and frequency of taking this medication, including desired effects and most common side effects.

## 2023-01-20 NOTE — Progress Notes (Signed)
NEUROLOGY FOLLOW UP OFFICE NOTE  [name] [id number] 1980/11/21  HISTORY OF PRESENT ILLNESS: I had the pleasure of seeing [name] in follow-up in the neurology clinic on 01/20/2023.  The patient was last seen a year ago for intractable epilepsy. She is alone in the office today.  Records and images were personally reviewed where available.  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 Last brain MRI with and without contrast the day after surgery showed small amount of restricted diffusion in the inferior temporal lobe at inferior aspect of resection cavity compatible with postsurgical margin infarct or contusion. During her stay, she was noted to have hyponatremia to 128. She was discharged home on Zonisamide 100mg  in AM, 500mg  at bedtime, Briviact 100mg  BID, Oxcarbazepine 600mg  in AM, 1200mg  in PM, nortriptyline 25mg  at bedtime. Our office was contacted in April 2024 about hyponatremia down to 123, oxcarbazepine reduced to 1 tab in AM, 1.5 tabs in PM (600,900). She was in the ER for a breakthrough seizure on 01/09/23 after missing her morning dose of Briviact. She denies any prior warning, she was caught before she fell. No tongue bite or incontinence. Sodium level was again 123, creatinine 1.12, Ca 8.0. I personally reviewed head CT without contrast which did not show any acute changes, there was interval encephalomalacia at the anterior left temporal lobe, minimal ex vacuo dilatation of left temporal horn.  She had called our office 01/07/23 to report "major migraines on the left side where she had brain surgery." Today she states that headaches are all over, not just in the surgical site. She would have bad headaches 3 days in a row. She used to have prn Ubrelvy but has not taken it. Nurtec was denied by insurance, however Emgality was approved in 10/2022. She states she has not received any Emgality.  She is on nortriptyline 25mg  at bedtime for migraine prophylaxis. She gets 4 hours of sleep at night, she wakes up at 1am and cannot go back to sleep. She lives with her 42 year old daughter. She has an aide who comes daily and helps with medications. Medications are in pillpacks. She denies any seizures "that I know of" since 01/09/23, but has woken up with her tongue or body sore. She has noticed slight body jerks/twitches in either arm or leg. She would be laying in bed watching TV then her left quickly jerks. No falls.    History from Initial Assessment 11/14/2017: This is a 42 year old right-handed woman with a history of migraines, back pain, and seizures, presenting to establish care. Seizures started around 2010 when she started having nocturnal convulsions with tongue bite and urinary incontinence. She would wake up with her children telling her what happened. She would be slightly confused but back to baseline quickly. She was on Keppra and Lamictal with no change in seizure frequency. At that time she was also drinking heavily, but even with decrease in alcohol intake, had not noticed any change in frequency. She was admitted to [facility] for 7 days in October 2012, typical episodes were not captured. EMU report unavailable for review, on discharge summary it was noted there were incidental left-sided sharp waves of which there may or ma not be a correlation to her spells. Keppra stopped and Lamictal dose increased. She was seeing neurologist Dr. Gerilyn Pilgrim, records unavailable for review, but it appears Fycompa was added to Lamictal. She reported  worsening of seizures, now occurring in wakefulness, and was admitted for another 7-day EMU stay at [facility] in January 2018, again typical events were not captured. Baseline EEG was abnormal with intermittent left anterior to midtemporal slow waves, sharply contoured bursts of theta slowing over the left temporal region, as well as intermittent right temporal  theta and delta slowing. There were also frequent 3-4 Hz very brief intermediate duration fluctuating runs of rhythmic delta activities (LRDA) maximal at F7. Occasional very brief runs were also seen maximal at F8.There were occasional independent left temporal and right temporal sharp waves. At times left temporal discharges have a wide field. There are ER visits in April, May, October, November 2018 for seizures, medications listed included Vimpat, Fycompa, and Oxtellar. Her last ER visit was on 11/05/17, taking Oxtellar 1200mg  BID without side effects. She reports compliance to medication.   She brings a calendar of her seizures in 2019. She had 2 in January, 2 in February, 1 in March, 3 so far in April. The last seizure occurred on 11/11/17 out of sleep, she woke up with a lip bite. She has had a lot of her seizures occur while playing at the [facility]. Family has witnessed the seizures, describing staring/unresponsiveness with lip smacking, then convulsions with head turn to the right. She denies any prior warning symptoms, no post-ictal focal weakness. Her entire body feels sore. No clear seizure triggers, she denies any sleep deprivation or missed medications. She has significantly cut down on alcohol, drinking 1 beer on the weekend.    She denies any olfactory/gustatory hallucinations, deja vu, rising epigastric sensation, focal numbness/tingling/weakness, myoclonic jerks. She has migraines after a seizure and had a prescription for Norco. She also has Zipsor and Robaxin to take after seizures. She denies any dizziness, diplopia, dysarthria/dysphagia, neck/back pain, bowel/bladder dysfunction.    Epilepsy Risk Factors:  Her brother had seizures in childhood. She had a normal birth and early development.  There is no history of febrile convulsions, CNS infections such as meningitis/encephalitis, significant traumatic brain injury, neurosurgical procedures, or family history of seizures. She has a scar  over the left temporal region after hitting her head on a window, she denies any loss of consciousness with this.    Prior AEDs: Lamictal, Keppra, Fycompa, Vimpat, Depakote, Cenobamate Prior migraine rescue medications: sumatriptan, rizatriptan, naratriptan, Migranal nasal spray, Ubrelvy, Fioricet, Robaxin, flexeril, ibuprofen, naproxen, diclofenac.   Laboratory Data:  EEGs: Her EEG at [facility] had shown bilateral temporal slowing, as well as rhythmic delta at F7 and F8, independent left and right temporal sharp waves. MRI: MRI brain with and without contrast in 10/2008 was unremarkable with scatted white matter changes I personally reviewed MRI brain with and without contrast done 11/17/17 which did not show any acute changes, hippocampi symmetric with no abnormal signal or enhancement seen. There were several white matter changes bilaterally with no abnormal enhancement.  Records from [facility] reviewed: She had a PET scan in 01/2020: no seizure focus identified  Video EEG (02/17/2020-02/23/2020) : She had one GTC with R-head deviation (appeared to be L-sided onset) and another episode with grunting and unclear shaking (pt off camera) with postictal agiatation, but no staring spells with oral and hand automatisms. Interictal EEG showed an abnormal EEG due to occasional left temporoparietal and parietal sharp waves.  Neuropsychological evaluation in 12/2019: Abnormal but consistent with expectation given her overall intellectual functioning and limited educational attainment. She does have slightly better discrimination or recognition of visual versus verbal material. However, overall, there  is not compelling evidence of lateralized dysfunction. Given her memory dysfunction, results suggest that she would be at a low risk of further memory impairment should she undergo temporal resection. There are no mood or psychiatric concerns of note and she has strong family support.    Oxcarbazepine level 03/2020: 36 (ref  10-35) Zonisamide level 03/2020: 24.2 (ref 10-40)  PAST MEDICAL HISTORY: Past Medical History:  Diagnosis Date   Back pain    Chronic headaches    Depression    Fall 03/07/2021   Genital warts    herpes   Herpes    Lip injury 01/25/2021   Lower lip trauma on 01/19/2021 during seizure, treated at [facility] urgent care   Seizures (HCC) 03/15/2016   Vision loss, bilateral 05/08/2021    MEDICATIONS: Current Outpatient Medications on File Prior to Visit  Medication Sig Dispense Refill   brivaracetam (BRIVIACT) 100 MG TABS tablet Take 1 tablet (100 mg total) by mouth 2 (two) times daily. 180 tablet 3   famciclovir (FAMVIR) 250 MG tablet TAKE 1 TABLET BY MOUTH TWICE DAILY 60 tablet 10   ibuprofen (ADVIL) 800 MG tablet Take by mouth.     medroxyPROGESTERone (DEPO-PROVERA) 150 MG/ML injection Inject 1 mL (150 mg total) into the muscle every 3 (three) months. 1 mL 4   nortriptyline (PAMELOR) 25 MG capsule TAKE 1 CAPSULE BY MOUTH AT BEDTIME 30 capsule 2   oxcarbazepine (TRILEPTAL) 600 MG tablet TAKE 1 tab in AM, 1 and 1/2 tabs in PM. 75 tablet 10   terbinafine (LAMISIL) 250 MG tablet Take 1 tablet (250 mg total) by mouth daily. 90 tablet 0   tiZANidine (ZANAFLEX) 4 MG tablet TAKE 1 TABLET BY MOUTH EVERY 6 HOURS AS NEEDED FOR MUSCLE SPASMS 30 tablet 10   triamcinolone (KENALOG) 0.025 % ointment Apply 1 Application topically 2 (two) times daily as needed (itching). 30 g 0   zonisamide (ZONEGRAN) 100 MG capsule TAKE ONE (1) CAPSULE BY MOUTH ONCE DAILY IN THE MORNING AND TAKE FIVE (5) CAPSULES IN THE EVENING 180 capsule 2   Ubrogepant (UBRELVY) 100 MG TABS Take 1 tablet as needed at onset of migraine. Do not take more than 3 a week (Patient not taking: Reported on 01/20/2023) 10 tablet 5   No current facility-administered medications on file prior to visit.    ALLERGIES: Allergies  Allergen Reactions   Morphine Other (See Comments)    Unknown Reaction  unknown    FAMILY HISTORY: Family History   Problem Relation Age of Onset   Hypertension Mother    Healthy Father     SOCIAL HISTORY: Social History   Socioeconomic History   Marital status: Single    Spouse name: Not on file   Number of children: 2   Years of education: 22, GED   Highest education level: 10th grade  Occupational History    Comment: na  Tobacco Use   Smoking status: Every Day    Packs/day: 1.00    Years: 10.00    Additional pack years: 0.00    Total pack years: 10.00    Types: Cigarettes    Passive exposure: Current   Smokeless tobacco: Never   Tobacco comments:    Offered Smoking Cessation.  Vaping Use   Vaping Use: Never used  Substance and Sexual Activity   Alcohol use: Not Currently    Alcohol/week: 2.0 standard drinks of alcohol    Types: 2 Cans of beer per week    Comment: last drink in December  Drug use: Yes    Types: Marijuana    Comment: everyday   Sexual activity: Not Currently    Partners: Male    Birth control/protection: None, Injection  Other Topics Concern   Not on file  Social History Narrative   Lives  With 1 child in home      Caffeine -Pepsi 2 daily       Right handed      Highest level of edu- 11 grade      Disabled   Social Determinants of Health   Financial Resource Strain: Low Risk  (05/17/2022)   Overall Financial Resource Strain (CARDIA)    Difficulty of Paying Living Expenses: Not hard at all  Food Insecurity: No Food Insecurity (08/02/2022)   Hunger Vital Sign    Worried About Running Out of Food in the Last Year: Never true    Ran Out of Food in the Last Year: Never true  Transportation Needs: No Transportation Needs (08/02/2022)   PRAPARE - Administrator, Civil Service (Medical): No    Lack of Transportation (Non-Medical): No  Recent Concern: Transportation Needs - Unmet Transportation Needs (05/06/2022)   PRAPARE - Administrator, Civil Service (Medical): Yes    Lack of Transportation (Non-Medical): No  Physical  Activity: Inactive (05/17/2022)   Exercise Vital Sign    Days of Exercise per Week: 0 days    Minutes of Exercise per Session: 0 min  Stress: No Stress Concern Present (05/17/2022)   [employer] of Occupational Health - Occupational Stress Questionnaire    Feeling of Stress : Only a little  Social Connections: Unknown (05/17/2022)   Social Connection and Isolation Panel [NHANES]    Frequency of Communication with Friends and Family: More than three times a week    Frequency of Social Gatherings with Friends and Family: More than three times a week    Attends Religious Services: More than 4 times per year    Active Member of [employer] or Organizations: Yes    Attends Engineer, structural: More than 4 times per year    Marital Status: Not on file  Intimate Partner Violence: Not At Risk (05/17/2022)   Humiliation, Afraid, Rape, and Kick questionnaire    Fear of Current or Ex-Partner: No    Emotionally Abused: No    Physically Abused: No    Sexually Abused: No     PHYSICAL EXAM: Vitals:   01/20/23 1237  BP: (!) 144/86  Pulse: 82  SpO2: 97%   General: No acute distress, flat affect Head:  Normocephalic/atraumatic Skin/Extremities: No rash, no edema Neurological Exam: alert and awake. No aphasia or dysarthria. Fund of knowledge is appropriate. Attention and concentration are normal.   Cranial nerves: Pupils equal, round. Extraocular movements intact with no nystagmus. Visual fields full.  No facial asymmetry.  Motor: Bulk and tone normal, muscle strength 5/5 throughout with no pronator drift.   Finger to nose testing intact.  Gait narrow-based and steady, no ataxia. No tremor or twitching in office today.   IMPRESSION: This is a 42 yo RH woman with a history of migraines and intractable epilepsy s/p left temporal encephalocele resection 07/30/2022. She has had one seizure with loss of awareness since the surgery, which occurred after missing one dose of Briviact. Sodium level  lower than during admission, 123. Oxcarbazepine dose had been reduced to 600mg  in AM, 900mg  in PM. Recheck sodium today. She is also on Zonisamide 100mg  in AM, 500mg  in  PM and Briviact 100mg  BID. She is reporting random body jerks/twitches since the surgery, we will do a 1-hour EEG. She has a history of migraines and reports worsening headaches. Loading dose of Emgality given today, she will start taking it once a month. Increase nortriptyline to 50mg  at bedtime, this may help with sleep as well. Refills sent for prn Ubrelvy for migraine rescue. She is scheduled to see epileptologist Dr. Imogene Burn on 7/16 and neurosurgeon Dr. Sunday Corn on 7/24, encouraged to follow-up as scheduled. She does not drive. Follow-up in 4 months, call for any changes.  .  Thank you for allowing me to participate in her care.  Please do not hesitate to call for any questions or concerns.    Patrcia Dolly, M.D.   CC: Dr. Lodema Hong

## 2023-01-20 NOTE — Patient Instructions (Addendum)
Good to see you.  Have bloodwork done for sodium level  2. Schedule 1-hour EEG  3. Increase nortriptyline 25mg : take 2 capsules every night  4. Start Emgality injection every month  5. Take Bernita Raisin as needed for migraine rescue  6. Follow-up as scheduled with Dr. Imogene Burn on February 05, 2023 and with Dr. Sunday Corn on February 12, 2023  7. Follow-up with me in 4-5 months, call for any changes   Seizure Precautions: 1. If medication has been prescribed for you to prevent seizures, take it exactly as directed.  Do not stop taking the medicine without talking to your doctor first, even if you have not had a seizure in a long time.   2. Avoid activities in which a seizure would cause danger to yourself or to others.  Don't operate dangerous machinery, swim alone, or climb in high or dangerous places, such as on ladders, roofs, or girders.  Do not drive unless your doctor says you may.  3. If you have any warning that you may have a seizure, lay down in a safe place where you can't hurt yourself.    4.  No driving for 6 months from last seizure, as per [facility].   Please refer to the following link on the Epilepsy Foundation of America's website for more information: http://www.epilepsyfoundation.org/answerplace/Social/driving/drivingu.cfm   5.  Maintain good sleep hygiene. Avoid alcohol.  6.  Notify your neurology if you are planning pregnancy or if you become pregnant.  7.  Contact your doctor if you have any problems that may be related to the medicine you are taking.  8.  Call 911 and bring the patient back to the ED if:        A.  The seizure lasts longer than 5 minutes.       B.  The patient doesn't awaken shortly after the seizure  C.  The patient has new problems such as difficulty seeing, speaking or moving  D.  The patient was injured during the seizure  E.  The patient has a temperature over 102 F (39C)  F.  The patient vomited and now is having trouble breathing

## 2023-01-21 ENCOUNTER — Ambulatory Visit: Payer: 59 | Admitting: Internal Medicine

## 2023-01-24 ENCOUNTER — Other Ambulatory Visit: Payer: 59

## 2023-01-24 ENCOUNTER — Encounter: Payer: Self-pay | Admitting: Neurology

## 2023-01-27 ENCOUNTER — Telehealth: Payer: Self-pay | Admitting: Neurology

## 2023-01-27 NOTE — Telephone Encounter (Signed)
Pt is calling in wanting to get clarification on her Rx nortriptyline (PAMELOR)  MG.

## 2023-01-28 NOTE — Telephone Encounter (Signed)
Nortriptyline Take 2 capsules every night

## 2023-02-04 DIAGNOSIS — Z5181 Encounter for therapeutic drug level monitoring: Secondary | ICD-10-CM | POA: Diagnosis not present

## 2023-02-04 DIAGNOSIS — Z91148 Patient's other noncompliance with medication regimen for other reason: Secondary | ICD-10-CM | POA: Diagnosis not present

## 2023-02-05 ENCOUNTER — Other Ambulatory Visit: Payer: Self-pay

## 2023-02-05 ENCOUNTER — Other Ambulatory Visit (HOSPITAL_COMMUNITY): Payer: Self-pay

## 2023-02-05 ENCOUNTER — Telehealth: Payer: Self-pay | Admitting: Pharmacy Technician

## 2023-02-05 MED ORDER — UBRELVY 100 MG PO TABS: ORAL_TABLET | ORAL | 5 refills | Status: DC

## 2023-02-05 NOTE — Telephone Encounter (Signed)
Pharmacy Patient Advocate Encounter  Received notification from [facility] that Prior Authorization for UBRELVY 100MG  has been APPROVED from 7.17.24 to 12.31.24.[name]  PA #/Case ID/Reference #: [id number]  Spoke with Pharmacy to process. Copay is $0

## 2023-02-05 NOTE — Telephone Encounter (Signed)
Pharmacy Patient Advocate Encounter   Received notification from Physician's Office that prior authorization for UBRELVY 100MG  is required/requested.   Insurance verification completed.   The patient is insured through [facility] .   Per test claim: PA submitted to [facility] via CoverMyMeds Key/confirmation #/EOC [id number] Status is pending

## 2023-02-06 ENCOUNTER — Telehealth: Payer: Self-pay

## 2023-02-06 ENCOUNTER — Telehealth: Payer: Self-pay | Admitting: Neurology

## 2023-02-06 NOTE — Telephone Encounter (Signed)
Pt is going to go to have her caregiver take her to the the pharmacy and have them look at her pills because the pills are different sizes and colors for her nortriptyline

## 2023-02-06 NOTE — Telephone Encounter (Signed)
Dr Imogene Burn from [facility] called in [name]'s sodium level is still low he is asking if you are ok if either he or you lowers oxcarbazepine (TRILEPTAL) 600 MG his clinic number is [phone] his personal number is [phone].

## 2023-02-06 NOTE — Telephone Encounter (Signed)
Patient was seen at [facility] has questions concerning medication that was given to her on 02/04/23, Patient wants to know if it is that same medication as she was taking before.[name]

## 2023-02-07 ENCOUNTER — Other Ambulatory Visit: Payer: Self-pay

## 2023-02-07 DIAGNOSIS — G40019 Localization-related (focal) (partial) idiopathic epilepsy and epileptic syndromes with seizures of localized onset, intractable, without status epilepticus: Secondary | ICD-10-CM

## 2023-02-07 MED ORDER — OXCARBAZEPINE 600 MG PO TABS
ORAL_TABLET | ORAL | Status: DC
Start: 2023-02-07 — End: 2023-09-15

## 2023-02-07 NOTE — Telephone Encounter (Signed)
Called patient left voicemail. Changed RX in Chart

## 2023-02-07 NOTE — Telephone Encounter (Signed)
Pls let her know Dr. Imogene Burn and I have communicated, her sodium level is better but still low. Recommend reducing oxcarbazepine further to 600mg : take 1 tablet twice a day. Pls send in updated Rx. Recheck sodium level in 1 week. Thanks

## 2023-02-10 ENCOUNTER — Telehealth: Payer: Self-pay | Admitting: Neurology

## 2023-02-10 ENCOUNTER — Other Ambulatory Visit: Payer: Self-pay

## 2023-02-10 DIAGNOSIS — E871 Hypo-osmolality and hyponatremia: Secondary | ICD-10-CM

## 2023-02-10 NOTE — Telephone Encounter (Signed)
I spoke with patient today an advised to check at pharmacy and do labs as instructed.

## 2023-02-10 NOTE — Telephone Encounter (Signed)
Patient is calling for questions about [facility] calling and about medication.[name]

## 2023-02-17 ENCOUNTER — Telehealth: Payer: Self-pay | Admitting: Neurology

## 2023-02-17 NOTE — Telephone Encounter (Signed)
Pt called no answer left a voice mail to call the office back. When she calls back pt needs to call her PCP about her foot pain.

## 2023-02-17 NOTE — Telephone Encounter (Signed)
Patient states she has pain in both her feet. Pain started 02/14/23 and its 10/10

## 2023-02-17 NOTE — Telephone Encounter (Signed)
Pt called an informed that she needs to call her PCP about her feet pain

## 2023-02-18 ENCOUNTER — Other Ambulatory Visit: Payer: Self-pay | Admitting: Neurology

## 2023-03-03 ENCOUNTER — Telehealth: Payer: Self-pay | Admitting: Neurology

## 2023-03-03 NOTE — Telephone Encounter (Signed)
Pt c/o: seizure Missed medications?  No. Sleep deprived?  No. Alcohol intake?  No. Increased stress? Yes.   Any change in medication color or shape? No. Back to their usual baseline self?  Yes.  . If no, advise go to ER Current medications prescribed by Dr. Karel Jarvis: zonisamide (ZONEGRAN ) oxcarbazepine (TRILEPTAL)

## 2023-03-03 NOTE — Telephone Encounter (Signed)
Patient is having a seizures again.

## 2023-03-04 ENCOUNTER — Encounter: Payer: Self-pay | Admitting: Family Medicine

## 2023-03-04 ENCOUNTER — Ambulatory Visit: Payer: 59 | Admitting: Family Medicine

## 2023-03-04 VITALS — BP 128/85 | HR 88 | Ht 62.0 in | Wt 180.0 lb

## 2023-03-04 DIAGNOSIS — E559 Vitamin D deficiency, unspecified: Secondary | ICD-10-CM

## 2023-03-04 DIAGNOSIS — F411 Generalized anxiety disorder: Secondary | ICD-10-CM | POA: Diagnosis not present

## 2023-03-04 DIAGNOSIS — Z59819 Housing instability, housed unspecified: Secondary | ICD-10-CM

## 2023-03-04 DIAGNOSIS — F17200 Nicotine dependence, unspecified, uncomplicated: Secondary | ICD-10-CM

## 2023-03-04 DIAGNOSIS — E669 Obesity, unspecified: Secondary | ICD-10-CM

## 2023-03-04 DIAGNOSIS — Z13220 Encounter for screening for lipoid disorders: Secondary | ICD-10-CM | POA: Diagnosis not present

## 2023-03-04 DIAGNOSIS — Z0001 Encounter for general adult medical examination with abnormal findings: Secondary | ICD-10-CM

## 2023-03-04 DIAGNOSIS — G40019 Localization-related (focal) (partial) idiopathic epilepsy and epileptic syndromes with seizures of localized onset, intractable, without status epilepticus: Secondary | ICD-10-CM

## 2023-03-04 MED ORDER — BUSPIRONE HCL 5 MG PO TABS
5.0000 mg | ORAL_TABLET | Freq: Two times a day (BID) | ORAL | 2 refills | Status: DC
Start: 2023-03-04 — End: 2023-06-09

## 2023-03-04 NOTE — Patient Instructions (Addendum)
F/u in 8 to 10 weeks re evaluate depression and anxiety  Annual wellness visi t to be scheduled at checkout  You are referred to [facility] and social worker with [facility] to help with housing needs in particular   Labs today, CBC, lipid, cmp and EGFR, tSH, vit D   New med for anxiety is buspar twice daily  You are referred to therapist  Thanks for choosing [facility], we consider it a privelige to serve you.

## 2023-03-04 NOTE — Progress Notes (Signed)
    [name]     MRN: [id number]      DOB: [birth year]  Chief Complaint  Patient presents with   Annual Exam    CPE patient reports seizure on Saturday thinks its stress related from possible bugs in her home.    HPI: Patient is in for annual physical exam. Moved to different apt and reports seeing worms , black spiders ,insects all the time, states she feels stressed though she has been living in that apt since January, reports that she has been seeing  more bugs . This  past Saturday she saw a big insect , has picture on her phone, got very stressed , states she had a seizure last week Saturday but did not go to the ED  has been trying to get different housing , does not want to live in an apt, certainly not the one she is in, states she has no social;worker, feels if she has a voucher.  She would be able to get housingStates she has lived where she is for over 15 years Recent labs,  are reviewed.  PE: BP 128/85 (BP Location: Right Arm, Patient Position: Sitting, Cuff Size: Normal)   Pulse 88   Ht 5\' 2"  (1.575 m)   Wt 180 lb 0.6 oz (81.7 kg)   SpO2 98%   BMI 32.93 kg/m   Pleasant  female, alert and oriented x 3, in no cardio-pulmonary distress. Afebrile. HEENT No facial trauma or asymetry. Sinuses non tender.  Extra occullar muscles intact.. External ears normal, . Neck: supple, no adenopathy,JVD or thyromegaly.No bruits.  Chest: Clear to ascultation bilaterally.No crackles or wheezes. Non tender to palpation    Cardiovascular system; Heart sounds normal,  S1 and  S2 ,no S3.    Peripheral pulses normal.  Abdomen: Soft, non tender, no organomegaly or masses. No bruits. Bowel sounds normal. No guarding, tenderness or rebound.      Musculoskeletal exam: Full ROM of spine, hips , shoulders and knees. No deformity ,swelling or crepitus noted. No muscle wasting or atrophy.   Neurologic: Cranial nerves 2 to 12 intact. Power, tone ,sensation and reflexes  normal throughout. No disturbance in gait. No tremor.  Skin: Intact, no ulceration, erythema , scaling or rash noted. Pigmentation normal throughout  Psych; Anxious, irritable, depressed and feels stressed over her housing situation. J Assessment & Plan:  Annual visit for general adult medical examination with abnormal findings Annual exam as documented. Immunization and cancer screening needs are specifically addressed at this visit.   Current smoker Asked:confirms currently smokes cigarettes Assess: Unwilling to set a quit date, but is cutting back Advise: needs to QUIT to reduce risk of cancer, cardio and cerebrovascular disease Assist: counseled for 5 minutes and literature provided Arrange: follow up in 2 to 4 months   Localization-related (focal) (partial) idiopathic epilepsy and epileptic syndromes with seizures of localized onset, intractable, without status epilepticus (HCC) Marked improvement in control following surgery, however reports recent seizure activity triggered by poor housing conditions, states bug in her house and unabl to get them exterminated , seeking new housing, needs help, refer [facility]  GAD (generalized anxiety disorder) Uncontrolled, start daily buspar and seeking housing assistance  Vitamin D deficiency Needs weekly vit D she will be contacted with this result

## 2023-03-05 NOTE — Telephone Encounter (Signed)
Pt called she wants to move to a different apartment because she has seen many big bugs and spiders and worms. She said that she has called the office and they said some one would come spray once a month and no one has been to her apartment.  She called corporate and now someone will be out today. Pt went to her PCP yesterday and stated that she wanted to kill her self over all that was going on with all the stress of the bugs and they sent her home with a referral for therapy and a RX for Buspar. Pt was advised that if she had thoughts of killing her self and with a plan  (she stated taken a bunch of her pill to kill her self ) she needed to go to [facility] that is the closes ER to her now for help. She said no she was going to talk to the people that her family Dr told her to talk too. Pt was asked about her Seizure medications  she is taken them ask prescribed.    brivaracetam (BRIVIACT) 100 MG TABS tablet  Take 1 tablet (100 mg total) by mouth 2 (two) times daily  oxcarbazepine (TRILEPTAL) 600 MG tablet Take 1 tablet two times daily  zonisamide (ZONEGRAN) 100 MG capsule TAKE ONE (1) CAPSULE BY MOUTH ONCE DAILY IN THE MORNING AND TAKE FIVE (5) CAPSULES IN THE EVENING,

## 2023-03-05 NOTE — Telephone Encounter (Signed)
Pls check on patient what she is asking for, is she asking to move to a different floor? We just adjusted her seizure medication, would monitor how she continues to do on current doses. Sodium level is better. Pls confirm how she is taking all her medications, dose and frequency. Thanks!

## 2023-03-07 ENCOUNTER — Telehealth: Payer: Self-pay | Admitting: Family Medicine

## 2023-03-07 NOTE — Telephone Encounter (Signed)
Patient is calling states she does not recall what shot she was told to get at [facility]. Please advise pt Thank you

## 2023-03-08 ENCOUNTER — Encounter: Payer: Self-pay | Admitting: Family Medicine

## 2023-03-08 DIAGNOSIS — Z0001 Encounter for general adult medical examination with abnormal findings: Secondary | ICD-10-CM | POA: Insufficient documentation

## 2023-03-08 DIAGNOSIS — F411 Generalized anxiety disorder: Secondary | ICD-10-CM | POA: Insufficient documentation

## 2023-03-08 NOTE — Assessment & Plan Note (Signed)
Annual exam as documented. . Immunization and cancer screening needs are specifically addressed at this visit.  

## 2023-03-08 NOTE — Assessment & Plan Note (Signed)
Needs weekly vit D she will be contacted with this result

## 2023-03-08 NOTE — Assessment & Plan Note (Signed)
Uncontrolled, start daily buspar and seeking housing assistance

## 2023-03-08 NOTE — Assessment & Plan Note (Signed)
Asked:confirms currently smokes cigarettes °Assess: Unwilling to set a quit date, but is cutting back °Advise: needs to QUIT to reduce risk of cancer, cardio and cerebrovascular disease °Assist: counseled for 5 minutes and literature provided °Arrange: follow up in 2 to 4 months ° °

## 2023-03-08 NOTE — Assessment & Plan Note (Signed)
Marked improvement in control following surgery, however reports recent seizure activity triggered by poor housing conditions, states bug in her house and unabl to get them exterminated , seeking new housing, needs help, refer [facility]

## 2023-03-10 ENCOUNTER — Telehealth: Payer: Self-pay | Admitting: *Deleted

## 2023-03-10 NOTE — Progress Notes (Signed)
  Care Coordination  Outreach Note  03/10/2023 Name: [name] MRN: [id number] DOB: 12-12-1980   Care Coordination Outreach Attempts: An unsuccessful telephone outreach was attempted today to offer the patient information about available care coordination services.  Follow Up Plan:  Additional outreach attempts will be made to offer the patient care coordination information and services.   Encounter Outcome:  No Answer   [name]  Care Coordination Care Guide  Direct Dial: [phone]

## 2023-03-10 NOTE — Progress Notes (Signed)
  Care Coordination   Note   03/10/2023 Name: [name] MRN: [id number] DOB: 10-13-1980  [name] is a 42 y.o. year old female who sees Kerri Perches, MD for primary care. I reached out to [name] by phone today to offer care coordination services.  [name] was given information about Care Coordination services today including:   The Care Coordination services include support from the care team which includes your Nurse Coordinator, Clinical Social Worker, or Pharmacist.  The Care Coordination team is here to help remove barriers to the health concerns and goals most important to you. Care Coordination services are voluntary, and the patient may decline or stop services at any time by request to their care team member.   Care Coordination Consent Status: Patient agreed to services and verbal consent obtained.   Follow up plan:  Telephone appointment with care coordination team member scheduled for:  03/11/23  Encounter Outcome:  Pt. Scheduled  [facility] Coordination Care Guide  Direct Dial: [phone]

## 2023-03-11 ENCOUNTER — Ambulatory Visit: Payer: Self-pay

## 2023-03-11 NOTE — Patient Outreach (Signed)
  Care Coordination   Initial Visit Note   03/11/2023 Name: [name] MRN: [id number] DOB: 08-30-1980  [name] is a 42 y.o. year old female who sees Kerri Perches, MD for primary care. I spoke with  [name] by phone today.  What matters to the patients health and wellness today?  Patient has a bug issue.  She has communicated with the leasing office but she reports the apartment has not been sprayed in over 6 months.  The leasing office agreed to spray the first of the month.  Patients is very overwhelmed by the bug.      Goals Addressed             This Visit's Progress    Pest Control/Housing search       Interventions Today    Flowsheet Row Most Recent Value  Chronic Disease   Chronic disease during today's visit Other  [seizures]  General Interventions   General Interventions Discussed/Reviewed General Interventions Discussed, General Interventions Reviewed, [employer] will contact legal aide and other low income housing communities.  Patient will follow up with leasing office on next appointment for pest control in September.  SW will provide a list of housing resorces and low income apartments.]  Mental Health Interventions   Mental Health Discussed/Reviewed Suicide  [History of suicidal comments.  Encourage patient to be positive.  Patient does not have a current plan to harm herself or others.  Understands that suicide would also hurt her family.]              SDOH assessments and interventions completed:  Yes  SDOH Interventions Today    Flowsheet Row Most Recent Value  SDOH Interventions   Food Insecurity Interventions Intervention Not Indicated, Other (Comment)  [Foodstamps]  Housing Interventions Other (Comment)  [Refer to [employer] for legal support]  Transportation Interventions [employer] ([employer]), Other (Comment)  Utilities Interventions Intervention Not Indicated         Care Coordination Interventions:  Yes, provided   Follow up plan: Follow up call scheduled for 03/26/23 at 11am.    Encounter Outcome:  Pt. Visit Completed

## 2023-03-11 NOTE — Patient Instructions (Signed)
Visit Information  Thank you for taking time to visit with me today. Please don't hesitate to contact me if I can be of assistance to you.   Following are the goals we discussed today:   Patient will contact legal aide for advice on her housing situation. Patient will follow up with the leasing office regarding date of next pest control. Sw will provide a list of housing resources and low income apartments.   Our next appointment is by telephone on 03/26/23 at 11am  Please call the care guide team at [phone] if you need to cancel or reschedule your appointment.   If you are experiencing a Mental Health or Behavioral Health Crisis or need someone to talk to, please call 911  Patient verbalizes understanding of instructions and care plan provided today and agrees to view in MyChart. Active MyChart status and patient understanding of how to access instructions and care plan via MyChart confirmed with patient.     Telephone follow up appointment with care management team member scheduled for:03/26/23 at 11am.  Lysle Morales, BSW Social Worker [facility] Care Management  [phone]

## 2023-03-17 ENCOUNTER — Telehealth: Payer: Self-pay | Admitting: Family Medicine

## 2023-03-17 ENCOUNTER — Ambulatory Visit (INDEPENDENT_AMBULATORY_CARE_PROVIDER_SITE_OTHER): Payer: 59 | Admitting: *Deleted

## 2023-03-17 DIAGNOSIS — Z3042 Encounter for surveillance of injectable contraceptive: Secondary | ICD-10-CM

## 2023-03-17 MED ORDER — MEDROXYPROGESTERONE ACETATE 150 MG/ML IM SUSP
150.0000 mg | Freq: Once | INTRAMUSCULAR | Status: AC
Start: 2023-03-17 — End: 2023-03-17
  Administered 2023-03-17: 150 mg via INTRAMUSCULAR

## 2023-03-17 NOTE — Telephone Encounter (Signed)
Pt called in return call to Katie  Pt also states that she has a seizure yesterday 8/25 Wants a call back

## 2023-03-17 NOTE — Progress Notes (Signed)
   NURSE VISIT- INJECTION  SUBJECTIVE:  [name] is a 42 y.o. G3P3 female here for a Depo Provera for contraception/period management. She is a GYN patient.   OBJECTIVE:  There were no vitals taken for this visit.  Appears well, in no apparent distress  Injection administered in: Left deltoid  Meds ordered this encounter  Medications   medroxyPROGESTERone (DEPO-PROVERA) injection 150 mg    ASSESSMENT: GYN patient Depo Provera for contraception/period management PLAN: Follow-up: in 11-13 weeks for next Depo   Jobe Marker  03/17/2023 10:51 AM

## 2023-03-18 NOTE — Telephone Encounter (Signed)
LMTRC-KG 

## 2023-03-19 NOTE — Telephone Encounter (Signed)
noted 

## 2023-03-19 NOTE — Telephone Encounter (Signed)
LMTRC-KG 

## 2023-03-19 NOTE — Telephone Encounter (Signed)
Patient returning call.   Teams message sent to clinic katie - Jahayra Gero returning your call park on [phone]

## 2023-03-21 ENCOUNTER — Telehealth: Payer: Self-pay | Admitting: Family Medicine

## 2023-03-21 MED ORDER — VITAMIN D (ERGOCALCIFEROL) 1.25 MG (50000 UNIT) PO CAPS
50000.0000 [IU] | ORAL_CAPSULE | ORAL | 8 refills | Status: DC
Start: 2023-03-21 — End: 2023-08-14

## 2023-03-21 NOTE — Telephone Encounter (Signed)
Patient calling thought Dr Lodema Hong was going to send in Vitamin D to her pharmacy.   Pharmacy: [facility] [place]

## 2023-03-21 NOTE — Telephone Encounter (Signed)
Patient aware.

## 2023-03-26 ENCOUNTER — Ambulatory Visit: Payer: Self-pay

## 2023-03-26 NOTE — Patient Instructions (Signed)
Visit Information  Thank you for taking time to visit with me today. Please don't hesitate to contact me if I can be of assistance to you.   Following are the goals we discussed today:  Patient will follow up with landlord on spraying unit for bugs consistently. Patient will continue to look for housing using the list provided by SW.   If you are experiencing a Mental Health or Behavioral Health Crisis or need someone to talk to, please call 911  Patient verbalizes understanding of instructions and care plan provided today and agrees to view in MyChart. Active MyChart status and patient understanding of how to access instructions and care plan via MyChart confirmed with patient.     No further follow up required: No further assistance is requested by patient.   Lysle Morales, BSW Social Worker [phone]

## 2023-03-26 NOTE — Patient Outreach (Signed)
  Care Coordination   Follow Up Visit Note   03/26/2023 Name: [name] MRN: [id number] DOB: [birth year]  [name] is a 42 y.o. year old female who sees Kerri Perches, MD for primary care. I spoke with  [name] by phone today.  What matters to the patients health and wellness today?  Patient continues to have a bug issue.  Patient will continue to look  for housing and communicate with landlord to spray unit consistently. Patient does not request a follow up visit.   Goals Addressed             This Visit's Progress    Pest Control/Housing search       Interventions Today    Flowsheet Row Most Recent Value  General Interventions   General Interventions Discussed/Reviewed General Interventions Discussed, General Interventions Reviewed, [employer]  [Pt reports she received housing list and is on the waiting list. Leasing office sprayed today but pt is not satisfied with spraying only 2 rooms. Pt will continue to communicate with leasing office and look for housing. Pt did not contact Legal Aide.]              SDOH assessments and interventions completed:  No     Care Coordination Interventions:  Yes, provided   Follow up plan: No further intervention required.   Encounter Outcome:  Patient Visit Completed

## 2023-04-01 ENCOUNTER — Other Ambulatory Visit: Payer: Self-pay | Admitting: Neurology

## 2023-04-14 ENCOUNTER — Other Ambulatory Visit: Payer: Self-pay | Admitting: Neurology

## 2023-05-08 ENCOUNTER — Encounter: Payer: Self-pay | Admitting: Family Medicine

## 2023-05-08 ENCOUNTER — Ambulatory Visit (INDEPENDENT_AMBULATORY_CARE_PROVIDER_SITE_OTHER): Payer: 59 | Admitting: Family Medicine

## 2023-05-08 VITALS — BP 125/81 | HR 88 | Resp 16 | Ht 61.0 in | Wt 195.0 lb

## 2023-05-08 DIAGNOSIS — E871 Hypo-osmolality and hyponatremia: Secondary | ICD-10-CM | POA: Diagnosis not present

## 2023-05-08 DIAGNOSIS — G4489 Other headache syndrome: Secondary | ICD-10-CM | POA: Diagnosis not present

## 2023-05-08 DIAGNOSIS — F17210 Nicotine dependence, cigarettes, uncomplicated: Secondary | ICD-10-CM

## 2023-05-08 DIAGNOSIS — B356 Tinea cruris: Secondary | ICD-10-CM

## 2023-05-08 DIAGNOSIS — F17200 Nicotine dependence, unspecified, uncomplicated: Secondary | ICD-10-CM | POA: Diagnosis not present

## 2023-05-08 DIAGNOSIS — B353 Tinea pedis: Secondary | ICD-10-CM | POA: Diagnosis not present

## 2023-05-08 DIAGNOSIS — B351 Tinea unguium: Secondary | ICD-10-CM

## 2023-05-08 DIAGNOSIS — Z23 Encounter for immunization: Secondary | ICD-10-CM

## 2023-05-08 DIAGNOSIS — G40019 Localization-related (focal) (partial) idiopathic epilepsy and epileptic syndromes with seizures of localized onset, intractable, without status epilepticus: Secondary | ICD-10-CM

## 2023-05-08 DIAGNOSIS — B3731 Acute candidiasis of vulva and vagina: Secondary | ICD-10-CM

## 2023-05-08 MED ORDER — FLUCONAZOLE 150 MG PO TABS: ORAL_TABLET | ORAL | 0 refills | Status: DC

## 2023-05-08 MED ORDER — KETOCONAZOLE 2 % EX CREA: 1.0000 | TOPICAL_CREAM | Freq: Two times a day (BID) | CUTANEOUS | 1 refills | Status: DC | PRN

## 2023-05-08 NOTE — Patient Instructions (Addendum)
F/u ion 7 weeks, call if you need me sooner  Pls schedule wellness at checkout  I am referring you to Nutritionist to help with weight loss, stop sweet tea,  no juice  or sodas  only water  Chem 7 today and Egfr  Flu vaccine today  Cream prescribed for feet , and 2 tablets for vaginal itch

## 2023-05-12 ENCOUNTER — Encounter: Payer: Self-pay | Admitting: Family Medicine

## 2023-05-12 DIAGNOSIS — E871 Hypo-osmolality and hyponatremia: Secondary | ICD-10-CM | POA: Insufficient documentation

## 2023-05-12 DIAGNOSIS — B3731 Acute candidiasis of vulva and vagina: Secondary | ICD-10-CM | POA: Insufficient documentation

## 2023-05-12 DIAGNOSIS — Z23 Encounter for immunization: Secondary | ICD-10-CM | POA: Insufficient documentation

## 2023-05-12 NOTE — Assessment & Plan Note (Signed)
Updated lab needed at/ before next visit.   

## 2023-05-12 NOTE — Assessment & Plan Note (Signed)
34 pound weight gain in 8 months, refer dietitian will also reach out to Gyne to see if IUD or other alternative is an option for the pt as currently on depo

## 2023-05-12 NOTE — Assessment & Plan Note (Signed)
Treated with 3 month course terbinafine

## 2023-05-12 NOTE — Assessment & Plan Note (Signed)
Controlled following surgery and on current medications, new concerns abourt exess weight gain

## 2023-05-12 NOTE — Assessment & Plan Note (Signed)
After obtaining informed consent, the vaccine is  administered , with no adverse effect noted at the time of administration.  

## 2023-05-12 NOTE — Assessment & Plan Note (Signed)
Asked:confirms currently smokes cigarettes 10/day states due to strss of weight gain Assess: Unwilling to set a quit date, but is cutting back Advise: needs to QUIT to reduce risk of cancer, cardio and cerebrovascular disease Assist: counseled for 5 minutes and literature provided Arrange: follow up in 2 to 4 months

## 2023-05-12 NOTE — Assessment & Plan Note (Signed)
Ketoconazole cream prescribed °

## 2023-05-12 NOTE — Assessment & Plan Note (Signed)
Treated based on history with fluconazole tabs #2

## 2023-05-12 NOTE — Assessment & Plan Note (Signed)
Managed by Neurology, good response to meds

## 2023-05-12 NOTE — Progress Notes (Signed)
   [name]     MRN: [id number]      DOB: 1980/11/30  Chief Complaint  Patient presents with   Seizures    Follow up visit and is concerned about recent weight gain and thinks its her seizure med     HPI [name] is here for follow up and re-evaluation of chronic medical conditions, medication management and review of any available recent lab and radiology data.  Preventive health is updated, specifically  Cancer screening and Immunization.   Questions or concerns regarding consultations or procedures which the PT has had in the interim are  addressed. The PT denies any adverse reactions to current medications since the last visit.  Concerned as above, also  smopking about 10/day. Mad about weight gain, wants help with this No seiozures since last visit  ROS Denies recent fever or chills. Denies sinus pressure, nasal congestion, ear pain or sore throat. Denies chest congestion, productive cough or wheezing. Denies chest pains, palpitations and leg swelling Denies abdominal pain, nausea, vomiting,diarrhea or constipation.   Denies dysuria, frequency, hesitancy or incontinence. Denies joint pain, swelling and limitation in mobility. . Denies skin break down or rash.   PE  BP 125/81   Pulse 88   Resp 16   Ht 5\' 1"  (1.549 m)   Wt 195 lb (88.5 kg)   SpO2 97%   BMI 36.84 kg/m   Patient alert and oriented and in no cardiopulmonary distress.  HEENT: No facial asymmetry, EOMI,     Neck supple .  Chest: Clear to auscultation bilaterally.  CVS: S1, S2 no murmurs, no S3.Regular rate.  ABD: Soft non tender.   Ext: No edema  MS: Adequate ROM spine, shoulders, hips and knees.  Skin: Intact, no ulcerations or rash noted.  Psych: Good eye contact, normal affect. Memory intact not anxious mildly depressed appearing.  CNS: CN 2-12 intact, power,  normal throughout.no focal deficits noted.   Assessment & Plan  Localization-related (focal) (partial) idiopathic  epilepsy and epileptic syndromes with seizures of localized onset, intractable, without status epilepticus (HCC) Controlled following surgery and on current medications, new concerns abourt exess weight gain  Tinea pedis of both feet Ketoconazole cream prescribed  Onychomycosis Treated with 3 month course terbinafine  Current smoker Asked:confirms currently smokes cigarettes 10/day states due to strss of weight gain Assess: Unwilling to set a quit date, but is cutting back Advise: needs to QUIT to reduce risk of cancer, cardio and cerebrovascular disease Assist: counseled for 5 minutes and literature provided Arrange: follow up in 2 to 4 months   Vaginal candida Treated based on history with fluconazole tabs #2  Headache Managed by Neurology, good response to meds  Morbid obesity (HCC) 34 pound weight gain in 8 months, refer dietitian will also reach out to Gyne to see if IUD or other alternative is an option for the pt as currently on depo  Encounter for immunization After obtaining informed consent, the vaccine is  administered , with no adverse effect noted at the time of administration.   Low sodium levels Updated lab needed at/ before next visit.

## 2023-05-13 LAB — BMP8+EGFR
BUN/Creatinine Ratio: 4 — ABNORMAL LOW (ref 9–23)
BUN: 3 mg/dL — ABNORMAL LOW (ref 6–24)
CO2: 18 mmol/L — ABNORMAL LOW (ref 20–29)
Calcium: 8.8 mg/dL (ref 8.7–10.2)
Chloride: 93 mmol/L — ABNORMAL LOW (ref 96–106)
Creatinine, Ser: 0.84 mg/dL (ref 0.57–1.00)
Glucose: 94 mg/dL (ref 70–99)
Potassium: 4.3 mmol/L (ref 3.5–5.2)
Sodium: 123 mmol/L — ABNORMAL LOW (ref 134–144)
eGFR: 88 mL/min/{1.73_m2} (ref >59)

## 2023-05-23 ENCOUNTER — Ambulatory Visit: Payer: 59 | Admitting: Neurology

## 2023-05-23 ENCOUNTER — Encounter: Payer: Self-pay | Admitting: Neurology

## 2023-06-02 ENCOUNTER — Telehealth: Payer: Self-pay | Admitting: Neurology

## 2023-06-02 NOTE — Telephone Encounter (Signed)
Patient has a question about the medication that starts with Emg she states that she got a text message about getting a new medication. She wants to know what this is for   She could not tell me the whole name of the medication, she could just me it was Emg

## 2023-06-02 NOTE — Telephone Encounter (Signed)
I asked patient to contact pharmacy, I do not see anything was sent in, she will call pharmacy.

## 2023-06-03 ENCOUNTER — Ambulatory Visit: Payer: 59 | Admitting: *Deleted

## 2023-06-03 DIAGNOSIS — Z3042 Encounter for surveillance of injectable contraceptive: Secondary | ICD-10-CM

## 2023-06-03 MED ORDER — MEDROXYPROGESTERONE ACETATE 150 MG/ML IM SUSP
150.0000 mg | Freq: Once | INTRAMUSCULAR | Status: AC
Start: 2023-06-03 — End: 2023-06-03
  Administered 2023-06-03: 150 mg via INTRAMUSCULAR

## 2023-06-03 NOTE — Progress Notes (Signed)
   NURSE VISIT- INJECTION  SUBJECTIVE:  [name] is a 42 y.o. G3P3 female here for a Depo Provera for contraception/period management. She is a GYN patient.   OBJECTIVE:  There were no vitals taken for this visit.  Appears well, in no apparent distress  Injection administered in: Right deltoid  Meds ordered this encounter  Medications   medroxyPROGESTERone (DEPO-PROVERA) injection 150 mg    ASSESSMENT: GYN patient Depo Provera for contraception/period management PLAN: Follow-up: in 11-13 weeks for next Depo   Annamarie Dawley  06/03/2023 2:51 PM

## 2023-06-09 ENCOUNTER — Other Ambulatory Visit: Payer: Self-pay | Admitting: Family Medicine

## 2023-06-09 ENCOUNTER — Ambulatory Visit: Payer: 59

## 2023-06-27 ENCOUNTER — Ambulatory Visit: Payer: 59 | Admitting: Family Medicine

## 2023-07-03 ENCOUNTER — Ambulatory Visit: Payer: 59 | Admitting: Nutrition

## 2023-07-03 ENCOUNTER — Encounter: Payer: Self-pay | Admitting: Family Medicine

## 2023-07-30 ENCOUNTER — Ambulatory Visit (INDEPENDENT_AMBULATORY_CARE_PROVIDER_SITE_OTHER): Payer: 59

## 2023-07-30 VITALS — BP 119/83 | HR 87 | Ht 61.0 in | Wt 203.0 lb

## 2023-07-30 DIAGNOSIS — Z0000 Encounter for general adult medical examination without abnormal findings: Secondary | ICD-10-CM | POA: Diagnosis not present

## 2023-07-30 NOTE — Progress Notes (Signed)
 Please attest and cosign this visit due to patients primary care provider not being in the office at the time the visit was completed.    Subjective:   [name] is a 43 y.o. female who presents for Medicare Annual (Subsequent) preventive examination.  Visit Complete: In person  Patient Medicare AWV questionnaire was completed by the patient on na; I have confirmed that all information answered by patient is correct and no changes since this date.  Cardiac Risk Factors include: sedentary lifestyle;obesity (BMI >30kg/m2);Other (see comment), Risk factor comments: has seizures     Objective:    Today's Vitals   07/30/23 1047  BP: 119/83  Pulse: 87  SpO2: 97%  Weight: 203 lb (92.1 kg)  Height: 5' 1 (1.549 m)   Body mass index is 38.36 kg/m.     07/30/2023   10:37 AM 01/20/2023   12:45 PM 01/09/2023    4:05 PM 05/17/2022    8:32 AM 02/14/2022   10:26 AM 08/24/2021   10:32 AM 03/03/2021    2:31 PM  Advanced Directives  Does Patient Have a Medical Advance Directive? No No No No No No No  Would patient like information on creating a medical advance directive? Yes (MAU/Ambulatory/Procedural Areas - Information given)  No - Patient declined No - Patient declined   No - Patient declined    Current Medications (verified) Outpatient Encounter Medications as of 07/30/2023  Medication Sig   BRIVIACT  100 MG TABS tablet TAKE 1 TABLET BY MOUTH TWICE DAILY   busPIRone  (BUSPAR ) 5 MG tablet TAKE 1 TABLET(5 MG) BY MOUTH TWICE DAILY   famciclovir  (FAMVIR ) 250 MG tablet TAKE 1 TABLET BY MOUTH TWICE DAILY   fluconazole  (DIFLUCAN ) 150 MG tablet Take one one tablet by mouth once daily for 2 days   Galcanezumab -gnlm (EMGALITY ) 120 MG/ML SOAJ Inject 1 pen  into the skin every 30 (thirty) days.   ibuprofen  (ADVIL ) 800 MG tablet Take by mouth.   ketoconazole  (NIZORAL ) 2 % cream Apply 1 Application topically 2 (two) times daily as needed for irritation.   medroxyPROGESTERone  (DEPO-PROVERA ) 150  MG/ML injection Inject 1 mL (150 mg total) into the muscle every 3 (three) months.   nortriptyline  (PAMELOR ) 25 MG capsule TAKE 2 CAPSULES BY MOUTH EVERY NIGHT   oxcarbazepine  (TRILEPTAL ) 600 MG tablet Take 1 tablet two times daily .   terbinafine  (LAMISIL ) 250 MG tablet Take 1 tablet (250 mg total) by mouth daily.   tiZANidine  (ZANAFLEX ) 4 MG tablet TAKE 1 TABLET BY MOUTH EVERY 6 HOURS AS NEEDED FOR MUSCLE SPASMS   triamcinolone  (KENALOG ) 0.025 % ointment Apply 1 Application topically 2 (two) times daily as needed (itching).   Ubrogepant  (UBRELVY ) 100 MG TABS Take 1 tablet as needed at onset of migraine. Do not take more than 3 a week   Vitamin D , Ergocalciferol , (DRISDOL ) 1.25 MG (50000 UNIT) CAPS capsule Take 1 capsule (50,000 Units total) by mouth every 7 (seven) days.   zonisamide  (ZONEGRAN ) 100 MG capsule TAKE ONE (1) CAPSULE BY MOUTH ONCE DAILY IN THE MORNING AND TAKE FIVE (5) CAPSULES IN THE EVENING   No facility-administered encounter medications on file as of 07/30/2023.    Allergies (verified) Morphine   History: Past Medical History:  Diagnosis Date   Back pain    Chronic headaches    Depression    Fall 03/07/2021   Genital warts    herpes   Herpes    Lip injury 01/25/2021   Lower lip trauma on 01/19/2021 during  seizure, treated at [facility] urgent care   Seizures (HCC) 03/15/2016   Vision loss, bilateral 05/08/2021   Past Surgical History:  Procedure Laterality Date   BRAIN SURGERY  07/30/2022   CESAREAN SECTION     x 3   Family History  Problem Relation Age of Onset   Hypertension Mother    Healthy Father    Social History   Socioeconomic History   Marital status: Single    Spouse name: Not on file   Number of children: 2   Years of education: 39, GED   Highest education level: 10th grade  Occupational History    Comment: na  Tobacco Use   Smoking status: Every Day    Current packs/day: 1.00    Average packs/day: 1 pack/day for 10.0 years (10.0 ttl pk-yrs)     Types: Cigarettes    Passive exposure: Current   Smokeless tobacco: Never   Tobacco comments:    Offered Smoking Cessation.  Vaping Use   Vaping status: Never Used  Substance and Sexual Activity   Alcohol use: Not Currently    Alcohol/week: 2.0 standard drinks of alcohol    Types: 2 Cans of beer per week    Comment: last drink in December   Drug use: Yes    Types: Marijuana    Comment: everyday   Sexual activity: Not Currently    Partners: Male    Birth control/protection: None, Injection  Other Topics Concern   Not on file  Social History Narrative   Lives  With 1 child in home      Caffeine  -Pepsi 2 daily       Right handed      Highest level of edu- 11 grade      Disabled   Social Drivers of Health   Financial Resource Strain: Low Risk  (07/30/2023)   Overall Financial Resource Strain (CARDIA)    Difficulty of Paying Living Expenses: Not hard at all  Food Insecurity: No Food Insecurity (07/30/2023)   Hunger Vital Sign    Worried About Running Out of Food in the Last Year: Never true    Ran Out of Food in the Last Year: Never true  Transportation Needs: No Transportation Needs (07/30/2023)   PRAPARE - Administrator, Civil Service (Medical): No    Lack of Transportation (Non-Medical): No  Physical Activity: Inactive (07/30/2023)   Exercise Vital Sign    Days of Exercise per Week: 0 days    Minutes of Exercise per Session: 0 min  Stress: No Stress Concern Present (07/30/2023)   [employer] of Occupational Health - Occupational Stress Questionnaire    Feeling of Stress : Not at all  Social Connections: Socially Isolated (07/30/2023)   Social Connection and Isolation Panel [NHANES]    Frequency of Communication with Friends and Family: Three times a week    Frequency of Social Gatherings with Friends and Family: Never    Attends Religious Services: Never    Database Administrator or Organizations: No    Attends Engineer, Structural: Never     Marital Status: Divorced    Tobacco Counseling Ready to quit: Not Answered Counseling given: Not Answered Tobacco comments: Offered Smoking Cessation.   Clinical Intake:  Pre-visit preparation completed: Yes  Pain : No/denies pain     BMI - recorded: 38.36 Nutritional Status: BMI > 30  Obese Nutritional Risks: None Diabetes: No  How often do you need to have someone help you when  you read instructions, pamphlets, or other written materials from your doctor or pharmacy?: 1 - Never  Interpreter Needed?: No  Information entered by :: Andriea Hasegawa, CMA   Activities of Daily Living    07/30/2023   10:50 AM  In your present state of health, do you have any difficulty performing the following activities:  Hearing? 0  Vision? 0  Difficulty concentrating or making decisions? 0  Walking or climbing stairs? 0  Dressing or bathing? 1  Comment has help if she needs it  Doing errands, shopping? 1  Comment patient does not drive. has to have someone take her  Preparing Food and eating ? Y  Comment someone prepares patients food  Using the Toilet? N  In the past six months, have you accidently leaked urine? Y  Do you have problems with loss of bowel control? N  Managing your Medications? Y  Comment patients Aid helps her  Managing your Finances? Y  Comment patients aid helps her  Housekeeping or managing your Housekeeping? Y  Comment patients aid helps her    Patient Care Team: Antonetta Rollene BRAVO, MD as PCP - General [place], Kofi, MD as Consulting Physician (Neurology) Georjean Darice HERO, MD as Consulting Physician (Neurology)  Indicate any recent Medical Services you may have received from other than [facility] providers in the past year (date may be approximate).     Assessment:   This is a routine wellness examination for Keira.  Hearing/Vision screen Hearing Screening - Comments:: Patient denies any hearing difficulties.   Vision Screening - Comments:: Wears rx  glasses - up to date with routine eye exams  [facility]   Goals Addressed             This Visit's Progress    Patient Stated         Depression Screen    07/30/2023   10:54 AM 03/04/2023   10:32 AM 11/11/2022    1:07 PM 10/30/2022    9:16 AM 09/18/2022    8:44 AM 08/08/2022   11:14 AM 06/26/2022    2:50 PM  PHQ 2/9 Scores  PHQ - 2 Score 6 6 1 1 6 3 3   PHQ- 9 Score 9 9 6 5 10 13 18     Fall Risk    07/30/2023   10:50 AM 05/08/2023   11:51 AM 03/11/2023    3:26 PM 03/04/2023   10:32 AM 01/20/2023   12:45 PM  Fall Risk   Falls in the past year? 0 1 0 0 1  Number falls in past yr: 0 0  0 1  Injury with Fall? 0 0  0 0  Risk for fall due to : No Fall Risks Other (Comment) Other (Comment) No Fall Risks   Risk for fall due to: Comment  seizures seizure disorder    Follow up Falls prevention discussed   Falls evaluation completed Falls evaluation completed    MEDICARE RISK AT HOME: Medicare Risk at Home Any stairs in or around the home?: Yes If so, are there any without handrails?: No Home free of loose throw rugs in walkways, pet beds, electrical cords, etc?: Yes Adequate lighting in your home to reduce risk of falls?: Yes Life alert?: Yes Use of a cane, walker or w/c?: Yes Grab bars in the bathroom?: Yes Shower chair or bench in shower?: Yes Elevated toilet seat or a handicapped toilet?: No  TIMED UP AND GO:  Was the test performed?  Yes  Length  of time to ambulate 10 feet: 5 sec Gait steady and fast without use of assistive device    Cognitive Function:        07/30/2023   10:49 AM 12/13/2020    1:24 PM 11/24/2018    1:53 PM 08/05/2016    3:29 PM  6CIT Screen  What Year? 0 points 0 points 0 points 0 points  What month? 0 points 0 points 0 points 0 points  What time? 0 points 0 points 0 points 0 points  Count back from 20 0 points 4 points 0 points 0 points  Months in reverse 0 points 4 points 0 points 0 points  Repeat phrase 0 points 4 points 0 points 0  points  Total Score 0 points 12 points 0 points 0 points    Immunizations Immunization History  Administered Date(s) Administered   Influenza Split 04/22/2012   Influenza Whole 05/26/2009   Influenza, Seasonal, Injecte, Preservative Fre 05/06/2019, 05/08/2023   Influenza,inj,Quad PF,6+ Mos 04/22/2013, 06/01/2014, 09/20/2015, 08/05/2016, 07/23/2018, 05/04/2019, 04/27/2020, 05/08/2021, 05/03/2022   Moderna Sars-Covid-2 Vaccination 08/03/2020, 08/31/2020   Pneumococcal Polysaccharide-23 02/12/2021   Td 07/27/2009   Tdap 09/21/2011    TDAP status: Due, Education has been provided regarding the importance of this vaccine. Advised may receive this vaccine at local pharmacy or Health Dept. Aware to provide a copy of the vaccination record if obtained from local pharmacy or Health Dept. Verbalized acceptance and understanding.  Flu Vaccine status: Up to date  Pneumococcal vaccine status: Not age appropriate for this patient.   Covid-19 vaccine status: Information provided on how to obtain vaccines.   Qualifies for Shingles Vaccine? No     Screening Tests Health Maintenance  Topic Date Due   COVID-19 Vaccine (3 - Moderna risk series) 09/28/2020   DTaP/Tdap/Td (3 - Td or Tdap) 09/20/2021   Medicare Annual Wellness (AWV)  02/05/2023   MAMMOGRAM  11/06/2023   Cervical Cancer Screening (HPV/Pap Cotest)  09/28/2026   INFLUENZA VACCINE  Completed   Hepatitis C Screening  Completed   HIV Screening  Completed   HPV VACCINES  Aged Out    Health Maintenance  Health Maintenance Due  Topic Date Due   COVID-19 Vaccine (3 - Moderna risk series) 09/28/2020   DTaP/Tdap/Td (3 - Td or Tdap) 09/20/2021   Medicare Annual Wellness (AWV)  02/05/2023    Colorectal Cancer Screenings: Not age appropriate for this patient.   Mammogram status: Completed 11/06/2022. Repeat every year  Bone Density Screening: Not age appropriate for this patient.   Lung Cancer Screening: (Low Dose CT Chest  recommended if Age 45-80 years, 20 pack-year currently smoking OR have quit w/in 15years.) does not qualify.   Lung Cancer Screening Referral: na  Additional Screening:  Hepatitis C Screening: does not qualify; Completed   Vision Screening: Recommended annual ophthalmology exams for early detection of glaucoma and other disorders of the eye. Is the patient up to date with their annual eye exam?  Yes  Who is the provider or what is the name of the office in which the patient attends annual eye exams? [facility] If pt is not established with a provider, would they like to be referred to a provider to establish care? No .   Dental Screening: Recommended annual dental exams for proper oral hygiene  Diabetic Foot Exam: na  Community Resource Referral / Chronic Care Management: CRR required this visit?  No   CCM required this visit?  Appt scheduled with PCP  Plan:     I have personally reviewed and noted the following in the patient's chart:   Medical and social history Use of alcohol, tobacco or illicit drugs  Current medications and supplements including opioid prescriptions. Patient is not currently taking opioid prescriptions. Functional ability and status Nutritional status Physical activity Advanced directives List of other physicians Hospitalizations, surgeries, and ER visits in previous 12 months Vitals Screenings to include cognitive, depression, and falls Referrals and appointments  In addition, I have reviewed and discussed with patient certain preventive protocols, quality metrics, and best practice recommendations. A written personalized care plan for preventive services as well as general preventive health recommendations were provided to patient.     Marian A Mora Pedraza, CMA   07/30/2023   After Visit Summary: (In Person-Printed) AVS printed and given to the patient  Nurse Notes: see routing comment

## 2023-07-30 NOTE — Patient Instructions (Signed)
 [name] , Thank you for taking time to come for your Medicare Wellness Visit. I appreciate your ongoing commitment to your health goals. Please review the following plan we discussed and let me know if I can assist you in the future.   Referrals/Orders/Follow-Ups/Clinician Recommendations:  Next Medicare Annual Wellness Visit: August 02, 2024 at 1:10 pm video visit  Come back for your visit with Dr. Antonetta on August 06, 2023 at 9:40 to discuss a referral to behavioral health and therapy.   You are due for a Covid booster and a Tetanus vaccine. Both of these can be given at any preferred pharmacy such as [facility] or [facility]  This is a list of the screening recommended for you and due dates:  Health Maintenance  Topic Date Due   COVID-19 Vaccine (3 - Moderna risk series) 09/28/2020   DTaP/Tdap/Td vaccine (3 - Td or Tdap) 09/20/2021   Medicare Annual Wellness Visit  02/05/2023   Mammogram  11/06/2023   Pap with HPV screening  09/28/2026   Flu Shot  Completed   Hepatitis C Screening  Completed   HIV Screening  Completed   HPV Vaccine  Aged Out    Advanced directives: (Provided) Advance directive discussed with you today. I have provided a copy for you to complete at home and have notarized. Once this is complete, please bring a copy in to our office so we can scan it into your chart.   Next Medicare Annual Wellness Visit scheduled for next year: Yes  Preventive Care 82-57 Years Old, Female Preventive care refers to lifestyle choices and visits with your health care provider that can promote health and wellness. Preventive care visits are also called wellness exams. What can I expect for my preventive care visit? Counseling Your health care provider may ask you questions about your: Medical history, including: Past medical problems. Family medical history. Pregnancy history. Current health, including: Menstrual cycle. Method of birth control. Emotional well-being. Home life and  relationship well-being. Sexual activity and sexual health. Lifestyle, including: Alcohol, nicotine  or tobacco, and drug use. Access to firearms. Diet, exercise, and sleep habits. Work and work astronomer. Sunscreen use. Safety issues such as seatbelt and bike helmet use. Physical exam Your health care provider will check your: Height and weight. These may be used to calculate your BMI (body mass index). BMI is a measurement that tells if you are at a healthy weight. Waist circumference. This measures the distance around your waistline. This measurement also tells if you are at a healthy weight and may help predict your risk of certain diseases, such as type 2 diabetes and high blood pressure. Heart rate and blood pressure. Body temperature. Skin for abnormal spots. What immunizations do I need?  Vaccines are usually given at various ages, according to a schedule. Your health care provider will recommend vaccines for you based on your age, medical history, and lifestyle or other factors, such as travel or where you work. What tests do I need? Screening Your health care provider may recommend screening tests for certain conditions. This may include: Lipid and cholesterol levels. Diabetes screening. This is done by checking your blood sugar (glucose) after you have not eaten for a while (fasting). Pelvic exam and Pap test. Hepatitis B test. Hepatitis C test. HIV (human immunodeficiency virus) test. STI (sexually transmitted infection) testing, if you are at risk. Lung cancer screening. Colorectal cancer screening. Mammogram. Talk with your health care provider about when you should start having regular mammograms. This may  depend on whether you have a family history of breast cancer. BRCA-related cancer screening. This may be done if you have a family history of breast, ovarian, tubal, or peritoneal cancers. Bone density scan. This is done to screen for osteoporosis. Talk with your  health care provider about your test results, treatment options, and if necessary, the need for more tests. Follow these instructions at home: Eating and drinking  Eat a diet that includes fresh fruits and vegetables, whole grains, lean protein, and low-fat dairy products. Take vitamin and mineral supplements as recommended by your health care provider. Do not drink alcohol if: Your health care provider tells you not to drink. You are pregnant, may be pregnant, or are planning to become pregnant. If you drink alcohol: Limit how much you have to 0-1 drink a day. Know how much alcohol is in your drink. In the [place] one drink equals one 12 oz bottle of beer (355 mL), one 5 oz glass of wine (148 mL), or one 1 oz glass of hard liquor (44 mL). Lifestyle Brush your teeth every morning and night with fluoride toothpaste. Floss one time each day. Exercise for at least 30 minutes 5 or more days each week. Do not use any products that contain nicotine  or tobacco. These products include cigarettes, chewing tobacco, and vaping devices, such as e-cigarettes. If you need help quitting, ask your health care provider. Do not use drugs. If you are sexually active, practice safe sex. Use a condom or other form of protection to prevent STIs. If you do not wish to become pregnant, use a form of birth control. If you plan to become pregnant, see your health care provider for a prepregnancy visit. Take aspirin  only as told by your health care provider. Make sure that you understand how much to take and what form to take. Work with your health care provider to find out whether it is safe and beneficial for you to take aspirin  daily. Find healthy ways to manage stress, such as: Meditation, yoga, or listening to music. Journaling. Talking to a trusted person. Spending time with friends and family. Minimize exposure to UV radiation to reduce your risk of skin cancer. Safety Always wear your seat belt while driving  or riding in a vehicle. Do not drive: If you have been drinking alcohol. Do not ride with someone who has been drinking. When you are tired or distracted. While texting. If you have been using any mind-altering substances or drugs. Wear a helmet and other protective equipment during sports activities. If you have firearms in your house, make sure you follow all gun safety procedures. Seek help if you have been physically or sexually abused. What's next? Visit your health care provider once a year for an annual wellness visit. Ask your health care provider how often you should have your eyes and teeth checked. Stay up to date on all vaccines. This information is not intended to replace advice given to you by your health care provider. Make sure you discuss any questions you have with your health care provider. Document Revised: 01/03/2021 Document Reviewed: 01/03/2021 Elsevier Patient Education  2024 [employer]. Understanding Your Risk for Falls Millions of people have serious injuries from falls each year. It is important to understand your risk of falling. Talk with your health care provider about your risk and what you can do to lower it. If you do have a serious fall, make sure to tell your provider. Falling once raises your risk of falling  again. How can falls affect me? Serious injuries from falls are common. These include: Broken bones, such as hip fractures. Head injuries, such as traumatic brain injuries (TBI) or concussions. A fear of falling can cause you to avoid activities and stay at home. This can make your muscles weaker and raise your risk for a fall. What can increase my risk? There are a number of risk factors that increase your risk for falling. The more risk factors you have, the higher your risk of falling. Serious injuries from a fall happen most often to people who are older than 45 years old. Teenagers and young adults ages 54-29 are also at higher risk. Common  risk factors include: Weakness in the lower body. Being generally weak or confused due to long-term (chronic) illness. Dizziness or balance problems. Poor vision. Medicines that cause dizziness or drowsiness. These may include: Medicines for your blood pressure, heart, anxiety, insomnia, or swelling (edema). Pain medicines. Muscle relaxants. Other risk factors include: Drinking alcohol. Having had a fall in the past. Having foot pain or wearing improper footwear. Working at a dangerous job. Having any of the following in your home: Tripping hazards, such as floor clutter or loose rugs. Poor lighting. Pets. Having dementia or memory loss. What actions can I take to lower my risk of falling?     Physical activity Stay physically fit. Do strength and balance exercises. Consider taking a regular class to build strength and balance. Yoga and tai chi are good options. Vision Have your eyes checked every year and your prescription for glasses or contacts updated as needed. Shoes and walking aids Wear non-skid shoes. Wear shoes that have rubber soles and low heels. Do not wear high heels. Do not walk around the house in socks or slippers. Use a cane or walker as told by your provider. Home safety Attach secure railings on both sides of your stairs. Install grab bars for your bathtub, shower, and toilet. Use a non-skid mat in your bathtub or shower. Attach bath mats securely with double-sided, non-slip rug tape. Use good lighting in all rooms. Keep a flashlight near your bed. Make sure there is a clear path from your bed to the bathroom. Use night-lights. Do not use throw rugs. Make sure all carpeting is taped or tacked down securely. Remove all clutter from walkways and stairways, including extension cords. Repair uneven or broken steps and floors. Avoid walking on icy or slippery surfaces. Walk on the grass instead of on icy or slick sidewalks. Use ice melter to get rid of ice on  walkways in the winter. Use a cordless phone. Questions to ask your health care provider Can you help me check my risk for a fall? Do any of my medicines make me more likely to fall? Should I take a vitamin D  supplement? What exercises can I do to improve my strength and balance? Should I make an appointment to have my vision checked? Do I need a bone density test to check for weak bones (osteoporosis)? Would it help to use a cane or a walker? Where to find more information Centers for Disease Control and Prevention, STEADI: tonerpromos.no Community-Based Fall Prevention Programs: tonerpromos.no [employer] on Aging: baseringtones.pl Contact a health care provider if: You fall at home. You are afraid of falling at home. You feel weak, drowsy, or dizzy. This information is not intended to replace advice given to you by your health care provider. Make sure you discuss any questions you have with your health  care provider. Document Revised: 03/11/2022 Document Reviewed: 03/11/2022 Elsevier Patient Education  2024 [employer].

## 2023-08-06 ENCOUNTER — Ambulatory Visit: Payer: 59 | Admitting: Family Medicine

## 2023-08-08 ENCOUNTER — Ambulatory Visit: Payer: Self-pay | Admitting: Family Medicine

## 2023-08-08 NOTE — Telephone Encounter (Addendum)
This RN attempted 3rd call to patient. No answer. LVM. Routed to office.  This RN attempted 2nd return call to patient. No answer. LVM. Route to call back folder for 3rd attempt.  This RN attempted return call to patient in regards to wrist pain. No answer. LVM. Will route to call back folder for further attempts.  Copied from CRM [id number]. Topic: Clinical - Medication Question >> Aug 08, 2023  2:38 PM Carlatta H wrote: Reason for CRM: Patient would like Syliva Overman to call her in a prescription for wrist pain//Please call patient and advise//

## 2023-08-11 NOTE — Telephone Encounter (Signed)
Called patient LVM to call office to schedule appt

## 2023-08-12 ENCOUNTER — Encounter: Payer: Self-pay | Admitting: Family Medicine

## 2023-08-14 ENCOUNTER — Ambulatory Visit (INDEPENDENT_AMBULATORY_CARE_PROVIDER_SITE_OTHER): Payer: 59 | Admitting: Family Medicine

## 2023-08-14 ENCOUNTER — Encounter: Payer: Self-pay | Admitting: Family Medicine

## 2023-08-14 VITALS — BP 133/85 | HR 90 | Ht 61.0 in | Wt 207.1 lb

## 2023-08-14 DIAGNOSIS — M25532 Pain in left wrist: Secondary | ICD-10-CM | POA: Insufficient documentation

## 2023-08-14 DIAGNOSIS — Z23 Encounter for immunization: Secondary | ICD-10-CM

## 2023-08-14 DIAGNOSIS — G40019 Localization-related (focal) (partial) idiopathic epilepsy and epileptic syndromes with seizures of localized onset, intractable, without status epilepticus: Secondary | ICD-10-CM

## 2023-08-14 MED ORDER — MELOXICAM 15 MG PO TABS: 15.0000 mg | ORAL_TABLET | Freq: Every day | ORAL | 0 refills | Status: DC

## 2023-08-14 MED ORDER — PREDNISONE 5 MG PO TABS: 5.0000 mg | ORAL_TABLET | Freq: Two times a day (BID) | ORAL | 0 refills | Status: DC

## 2023-08-14 MED ORDER — VITAMIN D (ERGOCALCIFEROL) 1.25 MG (50000 UNIT) PO CAPS: 50000.0000 [IU] | ORAL_CAPSULE | ORAL | 2 refills | Status: DC

## 2023-08-14 NOTE — Assessment & Plan Note (Signed)
Currently controlled , followed by Neurology and neurosyurgery

## 2023-08-14 NOTE — Assessment & Plan Note (Signed)
Meloxicam prescribed, to be followed by prednisone if needed

## 2023-08-14 NOTE — Patient Instructions (Addendum)
F/U as before, call if you need me sooner  Pneumonuia 20 vaccine in office today  Meloxicam is prescribed for wrist pain  If you are still hving pain in 2 weeks you can fill prednisone which is also prescribed to start in 2 weeks if needed  Once weekly vit D is needed your levels are very low  Thanks for choosing [facility], we consider it a privelige to serve you.

## 2023-08-14 NOTE — Assessment & Plan Note (Signed)
 After obtaining informed consent, the pneumonia 20 vaccine is  administered , with no adverse effect noted at the time of administration.

## 2023-08-14 NOTE — Progress Notes (Signed)
   [name]     MRN: [id number]      DOB: 1980/11/10  Chief Complaint  Patient presents with   Hand Pain    Pt reports sx of wrist pain on left hand, ongoing since before christmas.     HPI [name] is here c/o left wrist pain and swelling x 4 weeks, no inciting trauma noted Denies any recent seizures Still looking for housing Was depressed over hSV 2 d had been suicidal/ homicidal, this has resolved , needs f/u ppointment as schduled to further address  ROS Denies recent fever or chills. Denies sinus pressure, nasal congestion, ear pain or sore throat. Denies chest congestion, productive cough or wheezing. Denies chest pains, palpitations and leg swelling Denies abdominal pain, nausea, vomiting,diarrhea or constipation.   Denies dysuria, frequency, hesitancy or incontinence.  Denies headaches, seizures, numbness, or tingling. Denies  uncontrolled depression, anxiety or insomnia. Denies skin break down or rash.   PE  BP 133/85   Pulse 90   Ht 5\' 1"  (1.549 m)   Wt 207 lb 1.3 oz (93.9 kg)   SpO2 98%   BMI 39.13 kg/m   Patient alert and oriented and in no cardiopulmonary distress.  HEENT: No facial asymmetry, EOMI,     Neck supple .  Chest: Clear to auscultation bilaterally.  CVS: S1, S2 no murmurs, no S3.Regular rate.  ABD: Soft non tender.   Ext: No edema  MS: Adequate ROM spine, shoulders,  and knees.Left wrist mildly swollen with reduced ROM, no warmth or redness or t  Skin: Intact, no ulcerations or rash noted.  Psych: Good eye contact, normal affect. Memory intact not anxious or depressed appearing.  CNS: CN 2-12 intact, power,  normal throughout.no focal deficits noted.   Assessment & Plan  Wrist pain, acute, left Meloxicam prescribed, to be followed by prednisone if needed  Encounter for immunization After obtaining informed consent, the pneumonia 20 vaccine is  administered , with no adverse effect noted at the time of  administration.   Localization-related (focal) (partial) idiopathic epilepsy and epileptic syndromes with seizures of localized onset, intractable, without status epilepticus (HCC) Currently controlled , followed by Neurology and neurosyurgery

## 2023-08-22 ENCOUNTER — Telehealth: Payer: Self-pay

## 2023-08-22 ENCOUNTER — Other Ambulatory Visit: Payer: Self-pay | Admitting: Family Medicine

## 2023-08-22 NOTE — Telephone Encounter (Signed)
Needs an appointment with first available     Copied from CRM [id number]. Topic: Clinical - Medical Advice >> Aug 22, 2023  2:07 PM [name] wrote: Reason for CRM: patient is requesting medication for vaginal itching and bleeding, [phone]

## 2023-08-25 ENCOUNTER — Other Ambulatory Visit: Payer: Self-pay | Admitting: Adult Health

## 2023-08-25 NOTE — Telephone Encounter (Signed)
Called patient left voicemail to contact our office to schedule an in office visit. Thank you

## 2023-08-26 ENCOUNTER — Ambulatory Visit: Payer: 59 | Admitting: *Deleted

## 2023-08-26 DIAGNOSIS — Z3042 Encounter for surveillance of injectable contraceptive: Secondary | ICD-10-CM | POA: Diagnosis not present

## 2023-08-26 MED ORDER — MEDROXYPROGESTERONE ACETATE 150 MG/ML IM SUSY
150.0000 mg | PREFILLED_SYRINGE | Freq: Once | INTRAMUSCULAR | Status: AC
  Administered 2023-08-26: 150 mg via INTRAMUSCULAR

## 2023-08-26 NOTE — Progress Notes (Signed)
   NURSE VISIT- INJECTION  SUBJECTIVE:  [name] is a 43 y.o. G3P3 female here for a Depo Provera  for contraception/period management. She is a GYN patient.   OBJECTIVE:  There were no vitals taken for this visit.  Appears well, in no apparent distress  Injection administered in: Left deltoid  Meds ordered this encounter  Medications   medroxyPROGESTERone  Acetate SUSY 150 mg    ASSESSMENT: GYN patient Depo Provera  for contraception/period management PLAN: Follow-up: in 11-13 weeks for next Depo   Clarita Salt  08/26/2023 3:14 PM

## 2023-08-28 ENCOUNTER — Ambulatory Visit: Payer: Self-pay | Admitting: Family Medicine

## 2023-08-29 ENCOUNTER — Inpatient Hospital Stay (HOSPITAL_COMMUNITY)
Admission: EM | Admit: 2023-08-29 | Discharge: 2023-09-05 | DRG: 208 | Disposition: A | Discharge status: Home Health | Payer: 59 | Attending: Internal Medicine | Admitting: Internal Medicine

## 2023-08-29 ENCOUNTER — Emergency Department (HOSPITAL_COMMUNITY): Payer: 59

## 2023-08-29 ENCOUNTER — Ambulatory Visit: Payer: Self-pay | Admitting: Family Medicine

## 2023-08-29 ENCOUNTER — Encounter (HOSPITAL_COMMUNITY): Payer: Self-pay | Admitting: Emergency Medicine

## 2023-08-29 ENCOUNTER — Inpatient Hospital Stay (HOSPITAL_COMMUNITY): Payer: 59

## 2023-08-29 ENCOUNTER — Other Ambulatory Visit: Payer: Self-pay

## 2023-08-29 DIAGNOSIS — E871 Hypo-osmolality and hyponatremia: Secondary | ICD-10-CM | POA: Diagnosis not present

## 2023-08-29 DIAGNOSIS — R059 Cough, unspecified: Secondary | ICD-10-CM | POA: Diagnosis not present

## 2023-08-29 DIAGNOSIS — I214 Non-ST elevation (NSTEMI) myocardial infarction: Secondary | ICD-10-CM | POA: Diagnosis present

## 2023-08-29 DIAGNOSIS — Z978 Presence of other specified devices: Secondary | ICD-10-CM

## 2023-08-29 DIAGNOSIS — R918 Other nonspecific abnormal finding of lung field: Secondary | ICD-10-CM | POA: Diagnosis not present

## 2023-08-29 DIAGNOSIS — K3189 Other diseases of stomach and duodenum: Secondary | ICD-10-CM | POA: Diagnosis not present

## 2023-08-29 DIAGNOSIS — R569 Unspecified convulsions: Secondary | ICD-10-CM | POA: Diagnosis not present

## 2023-08-29 DIAGNOSIS — J9811 Atelectasis: Secondary | ICD-10-CM | POA: Diagnosis not present

## 2023-08-29 DIAGNOSIS — R030 Elevated blood-pressure reading, without diagnosis of hypertension: Secondary | ICD-10-CM | POA: Diagnosis not present

## 2023-08-29 DIAGNOSIS — R7989 Other specified abnormal findings of blood chemistry: Secondary | ICD-10-CM | POA: Diagnosis present

## 2023-08-29 DIAGNOSIS — E66812 Obesity, class 2: Secondary | ICD-10-CM | POA: Diagnosis present

## 2023-08-29 DIAGNOSIS — I959 Hypotension, unspecified: Secondary | ICD-10-CM | POA: Diagnosis present

## 2023-08-29 DIAGNOSIS — J159 Unspecified bacterial pneumonia: Secondary | ICD-10-CM | POA: Diagnosis present

## 2023-08-29 DIAGNOSIS — Z8249 Family history of ischemic heart disease and other diseases of the circulatory system: Secondary | ICD-10-CM

## 2023-08-29 DIAGNOSIS — R0602 Shortness of breath: Secondary | ICD-10-CM | POA: Diagnosis not present

## 2023-08-29 DIAGNOSIS — R0789 Other chest pain: Secondary | ICD-10-CM | POA: Diagnosis not present

## 2023-08-29 DIAGNOSIS — B9789 Other viral agents as the cause of diseases classified elsewhere: Secondary | ICD-10-CM | POA: Diagnosis present

## 2023-08-29 DIAGNOSIS — J189 Pneumonia, unspecified organism: Secondary | ICD-10-CM | POA: Diagnosis not present

## 2023-08-29 DIAGNOSIS — J810 Acute pulmonary edema: Secondary | ICD-10-CM | POA: Diagnosis present

## 2023-08-29 DIAGNOSIS — J1289 Other viral pneumonia: Principal | ICD-10-CM | POA: Diagnosis present

## 2023-08-29 DIAGNOSIS — R531 Weakness: Secondary | ICD-10-CM | POA: Diagnosis not present

## 2023-08-29 DIAGNOSIS — K9429 Other complications of gastrostomy: Secondary | ICD-10-CM | POA: Diagnosis not present

## 2023-08-29 DIAGNOSIS — R2689 Other abnormalities of gait and mobility: Secondary | ICD-10-CM | POA: Diagnosis not present

## 2023-08-29 DIAGNOSIS — Z885 Allergy status to narcotic agent status: Secondary | ICD-10-CM

## 2023-08-29 DIAGNOSIS — G43909 Migraine, unspecified, not intractable, without status migrainosus: Secondary | ICD-10-CM | POA: Diagnosis present

## 2023-08-29 DIAGNOSIS — I499 Cardiac arrhythmia, unspecified: Secondary | ICD-10-CM | POA: Diagnosis not present

## 2023-08-29 DIAGNOSIS — E872 Acidosis, unspecified: Secondary | ICD-10-CM | POA: Diagnosis not present

## 2023-08-29 DIAGNOSIS — H543 Unqualified visual loss, both eyes: Secondary | ICD-10-CM | POA: Diagnosis not present

## 2023-08-29 DIAGNOSIS — Z6839 Body mass index (BMI) 39.0-39.9, adult: Secondary | ICD-10-CM | POA: Diagnosis not present

## 2023-08-29 DIAGNOSIS — G40909 Epilepsy, unspecified, not intractable, without status epilepticus: Secondary | ICD-10-CM | POA: Diagnosis present

## 2023-08-29 DIAGNOSIS — I517 Cardiomegaly: Secondary | ICD-10-CM | POA: Diagnosis not present

## 2023-08-29 DIAGNOSIS — Z87728 Personal history of other specified (corrected) congenital malformations of nervous system and sense organs: Secondary | ICD-10-CM

## 2023-08-29 DIAGNOSIS — J9801 Acute bronchospasm: Secondary | ICD-10-CM | POA: Diagnosis not present

## 2023-08-29 DIAGNOSIS — R0902 Hypoxemia: Principal | ICD-10-CM

## 2023-08-29 DIAGNOSIS — Z1152 Encounter for screening for COVID-19: Secondary | ICD-10-CM

## 2023-08-29 DIAGNOSIS — F411 Generalized anxiety disorder: Secondary | ICD-10-CM | POA: Diagnosis present

## 2023-08-29 DIAGNOSIS — J90 Pleural effusion, not elsewhere classified: Secondary | ICD-10-CM | POA: Diagnosis not present

## 2023-08-29 DIAGNOSIS — F17210 Nicotine dependence, cigarettes, uncomplicated: Secondary | ICD-10-CM | POA: Diagnosis not present

## 2023-08-29 DIAGNOSIS — F32A Depression, unspecified: Secondary | ICD-10-CM | POA: Diagnosis present

## 2023-08-29 DIAGNOSIS — Z743 Need for continuous supervision: Secondary | ICD-10-CM | POA: Diagnosis not present

## 2023-08-29 DIAGNOSIS — Z716 Tobacco abuse counseling: Secondary | ICD-10-CM | POA: Diagnosis not present

## 2023-08-29 DIAGNOSIS — J9601 Acute respiratory failure with hypoxia: Secondary | ICD-10-CM | POA: Diagnosis present

## 2023-08-29 DIAGNOSIS — J984 Other disorders of lung: Secondary | ICD-10-CM | POA: Diagnosis not present

## 2023-08-29 DIAGNOSIS — Z79899 Other long term (current) drug therapy: Secondary | ICD-10-CM | POA: Diagnosis not present

## 2023-08-29 DIAGNOSIS — R933 Abnormal findings on diagnostic imaging of other parts of digestive tract: Secondary | ICD-10-CM | POA: Diagnosis not present

## 2023-08-29 DIAGNOSIS — Z9181 History of falling: Secondary | ICD-10-CM | POA: Diagnosis not present

## 2023-08-29 DIAGNOSIS — Z7982 Long term (current) use of aspirin: Secondary | ICD-10-CM

## 2023-08-29 DIAGNOSIS — R509 Fever, unspecified: Secondary | ICD-10-CM | POA: Diagnosis not present

## 2023-08-29 DIAGNOSIS — Z4682 Encounter for fitting and adjustment of non-vascular catheter: Secondary | ICD-10-CM | POA: Diagnosis not present

## 2023-08-29 DIAGNOSIS — Z791 Long term (current) use of non-steroidal anti-inflammatories (NSAID): Secondary | ICD-10-CM

## 2023-08-29 DIAGNOSIS — J9690 Respiratory failure, unspecified, unspecified whether with hypoxia or hypercapnia: Secondary | ICD-10-CM | POA: Diagnosis not present

## 2023-08-29 DIAGNOSIS — J9600 Acute respiratory failure, unspecified whether with hypoxia or hypercapnia: Secondary | ICD-10-CM | POA: Diagnosis not present

## 2023-08-29 HISTORY — DX: Acute respiratory failure with hypoxia: J96.01

## 2023-08-29 LAB — CBC WITH DIFFERENTIAL/PLATELET
Abs Immature Granulocytes: 0.1 10*3/uL — ABNORMAL HIGH (ref 0.00–0.07)
Basophils Absolute: 0 10*3/uL (ref 0.0–0.1)
Basophils Relative: 0 %
Eosinophils Absolute: 0 10*3/uL (ref 0.0–0.5)
Eosinophils Relative: 0 %
HCT: 36.6 % (ref 36.0–46.0)
Hemoglobin: 12.5 g/dL (ref 12.0–15.0)
Immature Granulocytes: 1 %
Lymphocytes Relative: 4 %
Lymphs Abs: 0.6 10*3/uL — ABNORMAL LOW (ref 0.7–4.0)
MCH: 31.3 pg (ref 26.0–34.0)
MCHC: 34.2 g/dL (ref 30.0–36.0)
MCV: 91.5 fL (ref 80.0–100.0)
Monocytes Absolute: 0.3 10*3/uL (ref 0.1–1.0)
Monocytes Relative: 2 %
Neutro Abs: 13.1 10*3/uL — ABNORMAL HIGH (ref 1.7–7.7)
Neutrophils Relative %: 93 %
Platelets: 410 10*3/uL — ABNORMAL HIGH (ref 150–400)
RBC: 4 MIL/uL (ref 3.87–5.11)
RDW: 15 % (ref 11.5–15.5)
WBC: 14.1 10*3/uL — ABNORMAL HIGH (ref 4.0–10.5)
nRBC: 0 % (ref 0.0–0.2)

## 2023-08-29 LAB — BLOOD GAS, ARTERIAL
Acid-base deficit: 6.1 mmol/L — ABNORMAL HIGH (ref 0.0–2.0)
Bicarbonate: 17.3 mmol/L — ABNORMAL LOW (ref 20.0–28.0)
Drawn by: 22223
O2 Saturation: 99.7 %
Patient temperature: 37
pCO2 arterial: 28 mm[Hg] — ABNORMAL LOW (ref 32–48)
pH, Arterial: 7.4 (ref 7.35–7.45)
pO2, Arterial: 140 mm[Hg] — ABNORMAL HIGH (ref 83–108)

## 2023-08-29 LAB — ECHOCARDIOGRAM COMPLETE
Area-P 1/2: 1.53 cm2
Height: 61 in
S' Lateral: 3.2 cm
Weight: 3315.72 [oz_av]

## 2023-08-29 LAB — BRAIN NATRIURETIC PEPTIDE: B Natriuretic Peptide: 171 pg/mL — ABNORMAL HIGH (ref 0.0–100.0)

## 2023-08-29 LAB — BASIC METABOLIC PANEL
Anion gap: 12 (ref 5–15)
BUN: 5 mg/dL — ABNORMAL LOW (ref 6–20)
CO2: 18 mmol/L — ABNORMAL LOW (ref 22–32)
Calcium: 8.5 mg/dL — ABNORMAL LOW (ref 8.9–10.3)
Chloride: 96 mmol/L — ABNORMAL LOW (ref 98–111)
Creatinine, Ser: 0.75 mg/dL (ref 0.44–1.00)
GFR, Estimated: >60 mL/min (ref >60)
Glucose, Bld: 115 mg/dL — ABNORMAL HIGH (ref 70–99)
Potassium: 4.5 mmol/L (ref 3.5–5.1)
Sodium: 126 mmol/L — ABNORMAL LOW (ref 135–145)

## 2023-08-29 LAB — COMPREHENSIVE METABOLIC PANEL
ALT: 11 U/L (ref 0–44)
AST: 30 U/L (ref 15–41)
Albumin: 3.2 g/dL — ABNORMAL LOW (ref 3.5–5.0)
Alkaline Phosphatase: 140 U/L — ABNORMAL HIGH (ref 38–126)
Anion gap: 14 (ref 5–15)
BUN: <5 mg/dL — ABNORMAL LOW (ref 6–20)
CO2: 16 mmol/L — ABNORMAL LOW (ref 22–32)
Calcium: 8.9 mg/dL (ref 8.9–10.3)
Chloride: 94 mmol/L — ABNORMAL LOW (ref 98–111)
Creatinine, Ser: 0.71 mg/dL (ref 0.44–1.00)
GFR, Estimated: >60 mL/min (ref >60)
Glucose, Bld: 149 mg/dL — ABNORMAL HIGH (ref 70–99)
Potassium: 4.1 mmol/L (ref 3.5–5.1)
Sodium: 124 mmol/L — ABNORMAL LOW (ref 135–145)
Total Bilirubin: 0.6 mg/dL (ref 0.0–1.2)
Total Protein: 8.1 g/dL (ref 6.5–8.1)

## 2023-08-29 LAB — RESP PANEL BY RT-PCR (RSV, FLU A&B, COVID)  RVPGX2
Influenza A by PCR: NEGATIVE
Influenza B by PCR: NEGATIVE
Resp Syncytial Virus by PCR: NEGATIVE
SARS Coronavirus 2 by RT PCR: NEGATIVE

## 2023-08-29 LAB — TROPONIN I (HIGH SENSITIVITY)
Troponin I (High Sensitivity): 225 ng/L (ref <18)
Troponin I (High Sensitivity): 210 ng/L (ref <18)

## 2023-08-29 LAB — PROCALCITONIN: Procalcitonin: 0.44 ng/mL

## 2023-08-29 LAB — HEPARIN LEVEL (UNFRACTIONATED)
Heparin Unfractionated: <0.1 [IU]/mL — ABNORMAL LOW (ref 0.30–0.70)
Heparin Unfractionated: 0.24 [IU]/mL — ABNORMAL LOW (ref 0.30–0.70)

## 2023-08-29 LAB — D-DIMER, QUANTITATIVE: D-Dimer, Quant: 1.99 ug{FEU}/mL — ABNORMAL HIGH (ref 0.00–0.50)

## 2023-08-29 LAB — LACTIC ACID, PLASMA
Lactic Acid, Venous: 1.8 mmol/L (ref 0.5–1.9)
Lactic Acid, Venous: 2.1 mmol/L (ref 0.5–1.9)

## 2023-08-29 LAB — SEDIMENTATION RATE: Sed Rate: 93 mm/h — ABNORMAL HIGH (ref 0–22)

## 2023-08-29 MED ORDER — METOPROLOL TARTRATE 25 MG PO TABS
25.0000 mg | ORAL_TABLET | Freq: Two times a day (BID) | ORAL | Status: DC
  Administered 2023-08-29 – 2023-08-30 (×3): 25 mg via ORAL
  Filled 2023-08-29 (×3): qty 1

## 2023-08-29 MED ORDER — FENTANYL CITRATE PF 50 MCG/ML IJ SOSY
50.0000 ug | PREFILLED_SYRINGE | INTRAMUSCULAR | Status: DC | PRN
Start: 2023-08-29 — End: 2023-09-02
  Administered 2023-08-30 (×7): 100 ug via INTRAVENOUS
  Administered 2023-08-30: 50 ug via INTRAVENOUS
  Administered 2023-08-30 – 2023-08-31 (×6): 100 ug via INTRAVENOUS
  Filled 2023-08-29 (×14): qty 2

## 2023-08-29 MED ORDER — METHYLPREDNISOLONE SODIUM SUCC 125 MG IJ SOLR
125.0000 mg | Freq: Once | INTRAMUSCULAR | Status: AC
  Administered 2023-08-29: 125 mg via INTRAVENOUS
  Filled 2023-08-29: qty 2

## 2023-08-29 MED ORDER — HYDROMORPHONE HCL 1 MG/ML IJ SOLN
0.5000 mg | Freq: Once | INTRAMUSCULAR | Status: AC
  Administered 2023-08-29: 0.5 mg via INTRAVENOUS
  Filled 2023-08-29: qty 0.5

## 2023-08-29 MED ORDER — SODIUM CHLORIDE 0.9 % IV SOLN
500.0000 mg | INTRAVENOUS | Status: AC
  Administered 2023-08-29 – 2023-09-02 (×5): 500 mg via INTRAVENOUS
  Filled 2023-08-29 (×5): qty 5

## 2023-08-29 MED ORDER — ACETAMINOPHEN 650 MG RE SUPP: 650.0000 mg | Freq: Four times a day (QID) | RECTAL | Status: DC | PRN

## 2023-08-29 MED ORDER — ACETAMINOPHEN 325 MG PO TABS
650.0000 mg | ORAL_TABLET | Freq: Four times a day (QID) | ORAL | Status: DC | PRN
  Administered 2023-08-29: 650 mg via ORAL
  Filled 2023-08-29: qty 2

## 2023-08-29 MED ORDER — LORAZEPAM 2 MG/ML IJ SOLN
1.0000 mg | INTRAMUSCULAR | Status: DC | PRN
  Administered 2023-08-29 – 2023-09-01 (×3): 1 mg via INTRAVENOUS
  Filled 2023-08-29 (×3): qty 1

## 2023-08-29 MED ORDER — IOHEXOL 350 MG/ML SOLN
75.0000 mL | Freq: Once | INTRAVENOUS | Status: AC | PRN
  Administered 2023-08-29: 75 mL via INTRAVENOUS

## 2023-08-29 MED ORDER — HEPARIN BOLUS VIA INFUSION
4000.0000 [IU] | Freq: Once | INTRAVENOUS | Status: AC
  Administered 2023-08-29: 4000 [IU] via INTRAVENOUS

## 2023-08-29 MED ORDER — OXCARBAZEPINE 300 MG PO TABS
600.0000 mg | ORAL_TABLET | Freq: Two times a day (BID) | ORAL | Status: DC
  Administered 2023-08-30: 600 mg via ORAL
  Filled 2023-08-29: qty 2

## 2023-08-29 MED ORDER — METHYLPREDNISOLONE SODIUM SUCC 125 MG IJ SOLR: 60.0000 mg | Freq: Two times a day (BID) | INTRAMUSCULAR | Status: DC

## 2023-08-29 MED ORDER — ASPIRIN 81 MG PO TBEC
81.0000 mg | DELAYED_RELEASE_TABLET | Freq: Every day | ORAL | Status: DC
Start: 2023-08-29 — End: 2023-08-30
  Administered 2023-08-29 – 2023-08-30 (×2): 81 mg via ORAL
  Filled 2023-08-29 (×2): qty 1

## 2023-08-29 MED ORDER — FUROSEMIDE 10 MG/ML IJ SOLN
40.0000 mg | Freq: Once | INTRAMUSCULAR | Status: AC
  Administered 2023-08-29: 40 mg via INTRAVENOUS
  Filled 2023-08-29: qty 4

## 2023-08-29 MED ORDER — ONDANSETRON HCL 4 MG/2ML IJ SOLN: 4.0000 mg | Freq: Four times a day (QID) | INTRAMUSCULAR | Status: DC | PRN

## 2023-08-29 MED ORDER — FUROSEMIDE 10 MG/ML IJ SOLN: 40.0000 mg | Freq: Once | INTRAMUSCULAR | Status: DC

## 2023-08-29 MED ORDER — NORTRIPTYLINE HCL 25 MG PO CAPS
25.0000 mg | ORAL_CAPSULE | Freq: Every day | ORAL | Status: DC
  Filled 2023-08-29 (×3): qty 1

## 2023-08-29 MED ORDER — PROPOFOL 1000 MG/100ML IV EMUL
0.0000 ug/kg/min | INTRAVENOUS | Status: DC
  Administered 2023-08-29: 5 ug/kg/min via INTRAVENOUS
  Administered 2023-08-30: 40 ug/kg/min via INTRAVENOUS
  Administered 2023-08-30 (×2): 30 ug/kg/min via INTRAVENOUS
  Administered 2023-08-30: 50 ug/kg/min via INTRAVENOUS
  Administered 2023-08-31 (×3): 60 ug/kg/min via INTRAVENOUS
  Administered 2023-08-31: 50 ug/kg/min via INTRAVENOUS
  Administered 2023-08-31: 60 ug/kg/min via INTRAVENOUS
  Administered 2023-08-31: 50 ug/kg/min via INTRAVENOUS
  Administered 2023-08-31: 60 ug/kg/min via INTRAVENOUS
  Administered 2023-08-31: 50 ug/kg/min via INTRAVENOUS
  Administered 2023-09-01 (×2): 65 ug/kg/min via INTRAVENOUS
  Administered 2023-09-01 (×2): 75 ug/kg/min via INTRAVENOUS
  Filled 2023-08-29 (×17): qty 100

## 2023-08-29 MED ORDER — ROCURONIUM BROMIDE 10 MG/ML (PF) SYRINGE
PREFILLED_SYRINGE | INTRAVENOUS | Status: AC
  Administered 2023-08-29: 100 mg
  Filled 2023-08-29: qty 10

## 2023-08-29 MED ORDER — BUSPIRONE HCL 5 MG PO TABS
5.0000 mg | ORAL_TABLET | Freq: Two times a day (BID) | ORAL | Status: DC
  Administered 2023-08-29 – 2023-08-30 (×2): 5 mg via ORAL
  Filled 2023-08-29 (×2): qty 1

## 2023-08-29 MED ORDER — KETOROLAC TROMETHAMINE 30 MG/ML IJ SOLN
30.0000 mg | Freq: Once | INTRAMUSCULAR | Status: AC
  Administered 2023-08-29: 30 mg via INTRAVENOUS
  Filled 2023-08-29: qty 1

## 2023-08-29 MED ORDER — HEPARIN BOLUS VIA INFUSION
2000.0000 [IU] | Freq: Once | INTRAVENOUS | Status: AC
  Administered 2023-08-29: 2000 [IU] via INTRAVENOUS

## 2023-08-29 MED ORDER — BRIVARACETAM 100 MG PO TABS
100.0000 mg | ORAL_TABLET | Freq: Two times a day (BID) | ORAL | Status: DC
  Administered 2023-08-30 – 2023-08-31 (×3): 100 mg via ORAL
  Filled 2023-08-29 (×3): qty 1

## 2023-08-29 MED ORDER — HEPARIN (PORCINE) 25000 UT/250ML-% IV SOLN
1500.0000 [IU]/h | INTRAVENOUS | Status: AC
Start: 2023-08-29 — End: 2023-08-31
  Administered 2023-08-29: 950 [IU]/h via INTRAVENOUS
  Administered 2023-08-31: 1500 [IU]/h via INTRAVENOUS
  Filled 2023-08-29 (×3): qty 250

## 2023-08-29 MED ORDER — SODIUM CHLORIDE 0.9 % IV SOLN
2.0000 g | INTRAVENOUS | Status: AC
  Administered 2023-08-29 – 2023-09-02 (×5): 2 g via INTRAVENOUS
  Filled 2023-08-29 (×5): qty 20

## 2023-08-29 MED ORDER — ETOMIDATE 2 MG/ML IV SOLN
INTRAVENOUS | Status: AC
  Administered 2023-08-29: 20 mg
  Filled 2023-08-29: qty 10

## 2023-08-29 MED ORDER — LEVALBUTEROL HCL 0.63 MG/3ML IN NEBU
0.6300 mg | INHALATION_SOLUTION | Freq: Four times a day (QID) | RESPIRATORY_TRACT | Status: DC | PRN
  Administered 2023-08-29: 0.63 mg via RESPIRATORY_TRACT
  Filled 2023-08-29: qty 3

## 2023-08-29 MED ORDER — FUROSEMIDE 10 MG/ML IJ SOLN
60.0000 mg | Freq: Once | INTRAMUSCULAR | Status: AC
  Administered 2023-08-29: 60 mg via INTRAVENOUS
  Filled 2023-08-29: qty 6

## 2023-08-29 MED ORDER — ONDANSETRON HCL 4 MG PO TABS: 4.0000 mg | ORAL_TABLET | Freq: Four times a day (QID) | ORAL | Status: DC | PRN

## 2023-08-29 MED ORDER — FENTANYL CITRATE PF 50 MCG/ML IJ SOSY
50.0000 ug | PREFILLED_SYRINGE | INTRAMUSCULAR | Status: DC | PRN
  Administered 2023-09-01 – 2023-09-02 (×2): 50 ug via INTRAVENOUS
  Filled 2023-08-29 (×2): qty 1

## 2023-08-29 NOTE — Progress Notes (Addendum)
 Patient extremely tachypneic. ABG obtained:  pH 7.4 PCO2 28 PO2 140.  Bicarb 17.3  Patient evaluated. Having wheezing with rales. Will give albuterol neb, solumedrol 125mg . Repeat lasix .  Repeat BMP pending.  Will repeat CXR.  Elizabeth Annett J Jayelle Page, DO

## 2023-08-29 NOTE — Progress Notes (Signed)
 PHARMACY - ANTICOAGULATION CONSULT NOTE  Pharmacy Consult for heparin  Indication: chest pain/ACS  Allergies  Allergen Reactions   Morphine Other (See Comments)    Unknown Reaction      Patient Measurements: Height: 5' 1 (154.9 cm) Weight: 94 kg (207 lb 3.7 oz) IBW/kg (Calculated) : 47.8 Heparin  Dosing Weight: 70 kg  Vital Signs: Temp: 99.2 F (37.3 C) (02/07 1002) Temp Source: Oral (02/07 1002) BP: 134/96 (02/07 1115) Pulse Rate: 112 (02/07 1115)  Labs: Recent Labs    08/29/23 1000 08/29/23 1037  HGB 12.5  --   HCT 36.6  --   PLT 410*  --   CREATININE 0.71  --   TROPONINIHS  --  225*    Estimated Creatinine Clearance: 93.9 mL/min (by C-G formula based on SCr of 0.71 mg/dL).   Medical History: Past Medical History:  Diagnosis Date   Back pain    Chronic headaches    Depression    Fall 03/07/2021   Genital warts    herpes   Herpes    Lip injury 01/25/2021   Lower lip trauma on 01/19/2021 during seizure, treated at [facility] urgent care   Seizures (HCC) 03/15/2016   Vision loss, bilateral 05/08/2021    Medications:  (Not in a hospital admission)   Assessment: Pharmacy consulted to dose heparin  in patient with chest pain/ACS.  Patient is not on anticoagulation prior to admission.  Trop 225 CBC WNL  Goal of Therapy:  Heparin  level 0.3-0.7 units/ml Monitor platelets by anticoagulation protocol: Yes   Plan:  Give 4000 units bolus x 1 Start heparin  infusion at 950 units/hr Check anti-Xa level in 6 hours and daily while on heparin  Continue to monitor H&H and platelets  Elspeth Sour, PharmD Clinical Pharmacist 08/29/2023 12:30 PM

## 2023-08-29 NOTE — ED Notes (Addendum)
 Pt given bsc to accommodate for shortness of breath. If pt desats with use pf bsc, purwick will be used

## 2023-08-29 NOTE — ED Provider Notes (Signed)
 [place] EMERGENCY DEPARTMENT AT [facility] Provider Note   CSN: [id number] Arrival date & time: 08/29/23  9053     History  Chief Complaint  Patient presents with   Shortness of Breath   Weakness   Cough    [name] is a 43 y.o. female.  Patient has a history of seizures.  She complains of cough and shortness of breath for few days  The history is provided by the patient and medical records. No language interpreter was used.  Shortness of Breath Severity:  Moderate Onset quality:  Sudden Timing:  Constant Progression:  Worsening Chronicity:  New Context: not activity   Relieved by:  Nothing Worsened by:  Nothing Ineffective treatments:  None tried Associated symptoms: cough   Associated symptoms: no abdominal pain, no chest pain, no headaches and no rash   Weakness Associated symptoms: cough and shortness of breath   Associated symptoms: no abdominal pain, no chest pain, no diarrhea, no frequency, no headaches and no seizures   Cough Associated symptoms: shortness of breath   Associated symptoms: no chest pain, no eye discharge, no headaches and no rash        Home Medications Prior to Admission medications   Medication Sig Start Date End Date Taking? Authorizing Provider  BRIVIACT  100 MG TABS tablet TAKE 1 TABLET BY MOUTH TWICE DAILY 04/14/23  Yes Georjean Darice CHRISTELLA, MD  busPIRone  (BUSPAR ) 5 MG tablet TAKE 1 TABLET(5 MG) BY MOUTH TWICE DAILY 06/09/23  Yes Antonetta Rollene BRAVO, MD  famciclovir  (FAMVIR ) 250 MG tablet TAKE ONE (1) TABLET BY MOUTH TWICE DAILY 08/25/23  Yes Antonetta Rollene BRAVO, MD  Galcanezumab -gnlm (EMGALITY ) 120 MG/ML SOAJ Inject 1 pen  into the skin every 30 (thirty) days. 02/20/23  Yes Georjean Darice CHRISTELLA, MD  medroxyPROGESTERone  (DEPO-PROVERA ) 150 MG/ML injection ADMINISTER 1 ML(150 MG) IN THE MUSCLE EVERY 3 MONTHS 08/26/23  Yes Signa Nest A, NP  ketoconazole  (NIZORAL ) 2 % cream Apply 1 Application topically 2 (two) times daily as  needed for irritation. Patient not taking: Reported on 08/29/2023 05/08/23   Antonetta Rollene BRAVO, MD  meloxicam  (MOBIC ) 15 MG tablet Take 1 tablet (15 mg total) by mouth daily. Patient not taking: Reported on 08/29/2023 08/14/23   Antonetta Rollene BRAVO, MD  nortriptyline  (PAMELOR ) 25 MG capsule TAKE 2 CAPSULES BY MOUTH EVERY NIGHT 04/01/23   Georjean Darice CHRISTELLA, MD  oxcarbazepine  (TRILEPTAL ) 600 MG tablet Take 1 tablet two times daily . 02/07/23   Georjean Darice CHRISTELLA, MD  terbinafine  (LAMISIL ) 250 MG tablet Take 1 tablet (250 mg total) by mouth daily. 01/13/23   Golda Lynwood PARAS, MD  tiZANidine  (ZANAFLEX ) 4 MG tablet TAKE 1 TABLET BY MOUTH EVERY 6 HOURS AS NEEDED FOR MUSCLE SPASMS 11/25/22   Georjean Darice CHRISTELLA, MD  triamcinolone  (KENALOG ) 0.025 % ointment Apply 1 Application topically 2 (two) times daily as needed (itching). 11/11/22   Golda Lynwood PARAS, MD  Ubrogepant  (UBRELVY ) 100 MG TABS Take 1 tablet as needed at onset of migraine. Do not take more than 3 a week 02/05/23   Georjean Darice CHRISTELLA, MD  Vitamin D , Ergocalciferol , (DRISDOL ) 1.25 MG (50000 UNIT) CAPS capsule Take 1 capsule (50,000 Units total) by mouth every 7 (seven) days. 08/14/23   Antonetta Rollene BRAVO, MD  zonisamide  (ZONEGRAN ) 100 MG capsule TAKE ONE (1) CAPSULE BY MOUTH ONCE DAILY IN THE MORNING AND TAKE FIVE (5) CAPSULES IN THE EVENING 01/20/23   Georjean Darice CHRISTELLA, MD  Allergies    Morphine    Review of Systems   Review of Systems  Constitutional:  Negative for appetite change and fatigue.  HENT:  Negative for congestion, ear discharge and sinus pressure.   Eyes:  Negative for discharge.  Respiratory:  Positive for cough and shortness of breath.   Cardiovascular:  Negative for chest pain.  Gastrointestinal:  Negative for abdominal pain and diarrhea.  Genitourinary:  Negative for frequency and hematuria.  Musculoskeletal:  Negative for back pain.  Skin:  Negative for rash.  Neurological:  Positive for weakness. Negative for seizures and headaches.   Psychiatric/Behavioral:  Negative for hallucinations.     Physical Exam Updated Vital Signs BP (!) 148/91   Pulse (!) 111   Temp 98.1 F (36.7 C) (Oral)   Resp (!) 41   Ht 5' 1 (1.549 m)   Wt 94 kg   SpO2 96%   BMI 39.16 kg/m  Physical Exam Vitals and nursing note reviewed.  Constitutional:      Appearance: She is well-developed.  HENT:     Head: Normocephalic.     Nose: Nose normal.  Eyes:     General: No scleral icterus.    Conjunctiva/sclera: Conjunctivae normal.  Neck:     Thyroid : No thyromegaly.  Cardiovascular:     Rate and Rhythm: Normal rate and regular rhythm.     Heart sounds: No murmur heard.    No friction rub. No gallop.  Pulmonary:     Breath sounds: No stridor. No wheezing or rales.  Chest:     Chest wall: No tenderness.  Abdominal:     General: There is no distension.     Tenderness: There is no abdominal tenderness. There is no rebound.  Musculoskeletal:        General: Normal range of motion.     Cervical back: Neck supple.  Lymphadenopathy:     Cervical: No cervical adenopathy.  Skin:    Findings: No erythema or rash.  Neurological:     Mental Status: She is alert and oriented to person, place, and time.     Motor: No abnormal muscle tone.     Coordination: Coordination normal.  Psychiatric:        Behavior: Behavior normal.     ED Results / Procedures / Treatments   Labs (all labs ordered are listed, but only abnormal results are displayed) Labs Reviewed  CBC WITH DIFFERENTIAL/PLATELET - Abnormal; Notable for the following components:      Result Value   WBC 14.1 (*)    Platelets 410 (*)    Neutro Abs 13.1 (*)    Lymphs Abs 0.6 (*)    Abs Immature Granulocytes 0.10 (*)    All other components within normal limits  COMPREHENSIVE METABOLIC PANEL - Abnormal; Notable for the following components:   Sodium 124 (*)    Chloride 94 (*)    CO2 16 (*)    Glucose, Bld 149 (*)    BUN <5 (*)    Albumin 3.2 (*)    Alkaline Phosphatase  140 (*)    All other components within normal limits  BRAIN NATRIURETIC PEPTIDE - Abnormal; Notable for the following components:   B Natriuretic Peptide 171.0 (*)    All other components within normal limits  TROPONIN I (HIGH SENSITIVITY) - Abnormal; Notable for the following components:   Troponin I (High Sensitivity) 225 (*)    All other components within normal limits  TROPONIN I (HIGH SENSITIVITY) - Abnormal;  Notable for the following components:   Troponin I (High Sensitivity) 210 (*)    All other components within normal limits  RESP PANEL BY RT-PCR (RSV, FLU A&B, COVID)  RVPGX2  HEPARIN  LEVEL (UNFRACTIONATED)  HIGH SENSITIVITY CRP  SEDIMENTATION RATE  RAPID URINE DRUG SCREEN, HOSP PERFORMED  D-DIMER, QUANTITATIVE    EKG EKG Interpretation Date/Time:  Friday August 29 2023 10:05:12 EST Ventricular Rate:  116 PR Interval:  146 QRS Duration:  98 QT Interval:  318 QTC Calculation: 442 R Axis:   74  Text Interpretation: Sinus tachycardia Low voltage, precordial leads Abnormal T, consider ischemia, anterior leads Confirmed by Suzette Pac [phone]) on 08/29/2023 10:54:40 AM  Radiology ECHOCARDIOGRAM COMPLETE Result Date: 08/29/2023    ECHOCARDIOGRAM REPORT   Patient Name:   AMERIAH LINT Date of Exam: 08/29/2023 Medical Rec #:  [id number]        Height:       61.0 in Accession #:    [id number]       Weight:       207.2 lb Date of Birth:  1980-08-18        BSA:          1.917 m Patient Age:    42 years         BP:           146/93 mmHg Patient Gender: F                HR:           113 bpm. Exam Location:  [facility] Procedure: 2D Echo, Cardiac Doppler and Color Doppler Indications:    CHF-Acute Systolic I50.21 / Increased Troponins  History:        Patient has no prior history of Echocardiogram examinations.                 Risk Factors:Current Smoker.  Sonographer:    Aida Pizza RCS Referring Phys: [phone] Kishon Garriga IMPRESSIONS  1. Left ventricular ejection fraction, by  estimation, is 65 to 70%. The left ventricle has normal function. Left ventricular endocardial border not optimally defined to evaluate regional wall motion - cannot exclude basal posterolateral hypokinesis. There is mild asymmetric left ventricular hypertrophy of the septal segment. Left ventricular diastolic parameters are consistent with Grade I diastolic dysfunction (impaired relaxation).  2. Right ventricular systolic function is normal. The right ventricular size is normal. Tricuspid regurgitation signal is inadequate for assessing PA pressure.  3. The mitral valve is grossly normal. Trivial mitral valve regurgitation.  4. The aortic valve is tricuspid. Aortic valve regurgitation is not visualized. No aortic stenosis is present.  5. The inferior vena cava is normal in size with greater than 50% respiratory variability, suggesting right atrial pressure of 3 mmHg. Comparison(s): No prior Echocardiogram. FINDINGS  Left Ventricle: Left ventricular ejection fraction, by estimation, is 65 to 70%. The left ventricle has normal function. Left ventricular endocardial border not optimally defined to evaluate regional wall motion. The left ventricular internal cavity size was normal in size. There is mild asymmetric left ventricular hypertrophy of the septal segment. Left ventricular diastolic parameters are consistent with Grade I diastolic dysfunction (impaired relaxation). Right Ventricle: The right ventricular size is normal. No increase in right ventricular wall thickness. Right ventricular systolic function is normal. Tricuspid regurgitation signal is inadequate for assessing PA pressure. Left Atrium: Left atrial size was normal in size. Right Atrium: Right atrial size was normal in size. Pericardium: Trivial pericardial effusion is present.  The pericardial effusion is surrounding the apex. Presence of epicardial fat layer. Mitral Valve: The mitral valve is grossly normal. Trivial mitral valve regurgitation.  Tricuspid Valve: The tricuspid valve is grossly normal. Tricuspid valve regurgitation is trivial. Aortic Valve: The aortic valve is tricuspid. There is mild aortic valve annular calcification. Aortic valve regurgitation is not visualized. No aortic stenosis is present. Pulmonic Valve: The pulmonic valve was grossly normal. Pulmonic valve regurgitation is trivial. Aorta: The aortic root is normal in size and structure. Venous: The inferior vena cava is normal in size with greater than 50% respiratory variability, suggesting right atrial pressure of 3 mmHg. IAS/Shunts: No atrial level shunt detected by color flow Doppler.  LEFT VENTRICLE PLAX 2D LVIDd:         4.30 cm   Diastology LVIDs:         3.20 cm   LV e' medial:    5.66 cm/s LV PW:         1.00 cm   LV E/e' medial:  11.4 LV IVS:        1.20 cm   LV e' lateral:   11.30 cm/s LVOT diam:     2.00 cm   LV E/e' lateral: 5.7 LV SV:         53 LV SV Index:   28 LVOT Area:     3.14 cm  RIGHT VENTRICLE RV S prime:     18.20 cm/s TAPSE (M-mode): 1.7 cm LEFT ATRIUM             Index        RIGHT ATRIUM           Index LA diam:        3.20 cm 1.67 cm/m   RA Area:     14.60 cm LA Vol (A2C):   39.9 ml 20.81 ml/m  RA Volume:   41.50 ml  21.64 ml/m LA Vol (A4C):   24.5 ml 12.78 ml/m LA Biplane Vol: 32.4 ml 16.90 ml/m  AORTIC VALVE LVOT Vmax:   119.00 cm/s LVOT Vmean:  70.500 cm/s LVOT VTI:    0.168 m  AORTA Ao Root diam: 3.40 cm MITRAL VALVE MV Area (PHT): 1.53 cm    SHUNTS MV Decel Time: 496 msec    Systemic VTI:  0.17 m MV E velocity: 64.80 cm/s  Systemic Diam: 2.00 cm MV A velocity: 83.60 cm/s MV E/A ratio:  0.78 Jayson Sierras MD Electronically signed by Jayson Sierras MD Signature Date/Time: 08/29/2023/1:33:58 PM    Final    DG Chest Port 1 View Result Date: 08/29/2023 CLINICAL DATA:  43 year old female with shortness of breath, nonproductive cough for 2-3 days. EXAM: PORTABLE CHEST 1 VIEW COMPARISON:  Chest radiographs 12/26/2014. FINDINGS: Portable AP semi  upright view at 1022 hours. Lordotic positioning. Cardiomegaly is new since 2016. Lower lung volumes. Diffuse bilateral pulmonary interstitial opacity, basilar and peripheral predominant and fairly symmetric. Trace fluid in the right minor fissure. No other pleural effusions are evident. No pneumothorax or air bronchograms. No acute osseous abnormality identified. Negative visible bowel gas pattern. IMPRESSION: Cardiomegaly is new since 2016 with diffuse pulmonary interstitial opacity favored to be acute pulmonary edema. Trace pleural fluid in the right minor fissure. Bilateral pneumonia felt less likely. Electronically Signed   By: VEAR Hurst M.D.   On: 08/29/2023 11:01    Procedures Procedures    Medications Ordered in ED Medications  heparin  ADULT infusion 100 units/mL (25000 units/250mL) (950 Units/hr Intravenous New Bag/Given 08/29/23  1258)  metoprolol  tartrate (LOPRESSOR ) tablet 25 mg (has no administration in time range)  aspirin  EC tablet 81 mg (has no administration in time range)  furosemide  (LASIX ) injection 40 mg (40 mg Intravenous Given 08/29/23 1126)  heparin  bolus via infusion 4,000 Units (4,000 Units Intravenous Bolus from Bag 08/29/23 1258)    ED Course/ Medical Decision Making/ A&P                                 Medical Decision Making Amount and/or Complexity of Data Reviewed Labs: ordered. Radiology: ordered.  Risk Prescription drug management. Decision regarding hospitalization.   Patient with hypoxia.  Unknown cause.  Patient will be admitted to medicine with cardiology following        Final Clinical Impression(s) / ED Diagnoses Final diagnoses:  Hypoxia    Rx / DC Orders ED Discharge Orders     None         Suzette Pac, MD 09/01/23 1009

## 2023-08-29 NOTE — Progress Notes (Signed)
 CRITICAL CARE  [name] is a 43 y.o. 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   She was admitted due to acute hypoxemic respiratory failure-multifactorial with pulmonary edema versus sepsis, POA, secondary to pneumonia.  Patient was seen by her cardiologist, echo done showed preserved LVEF and G1 DD.  CT PE study was negative for pulmonary embolus, but was positive pneumonia and she was started on IV ceftriaxone  azithromycin . Troponin was elevated at 225  > 210.  Cardiology wants patient to be admitted to [facility] for further evaluation.  On arrival of [facility] to transport patient, it was noted that her respiratory rate was in the 50s to 60s, patient was seen by Dr. Barbra.  ABG done showed pH 7.4, pCO2 28, pO2 140, bicarb 17.3.  Patient was placed on NRB, IV Dilaudid  0.5 mg x 1 was given.  Breathing treatment was provided, respiratory rate improved to mid to high 20s. RN called for reevaluation of patient prior to [facility] arrival for transportation.  At bedside, patient was febrile with a temp of 100.7 F, she presents with coarse breath sounds with diffuse wheezes and rales.  She continued to use accessory muscles for breathing and this has been going on for hours and she appears tired. My assessment was that patient will need to be intubated prior to transportation, since patient has high risk of decompensation during transportation to [facility] and the patient will need an ICU, because patient does not appear that she will be able to maintain airway by the time she gets to [facility]. ED physician (Dr. Patsey) agreed with my assessment. PCCM (Dr. Mancel Ply) was consulted and he agreed with intubation and patient was accepted to ICU at [facility]. ED physician (Dr. Patsey) was appreciated for helping with patient's intubation.  Critical time: 41 minutes   Critical care personally provided  managing the patient due to high probability of clinically significant and life threatening deterioration. This critical care time included obtaining a history; examining the patient, pulse oximetry; ordering and review of studies; arranging urgent treatment with development of a management plan; evaluation of patient's response of treatment; frequent reassessment; and discussions with other providers.  This critical care time was performed to assess and manage the high probability of imminent and life threatening deterioration that could result in multi-organ failure.

## 2023-08-29 NOTE — Telephone Encounter (Signed)
 Noted and agree.

## 2023-08-29 NOTE — Telephone Encounter (Signed)
  Chief Complaint: severe SOB Symptoms: Cough, feeling hot then cold has not checked her temperature, runny nose, chest pain, anxiety Frequency: x 2-3 days Pertinent Negatives: Patient denies known exposure, recent travel. Disposition: [x] ED /[] Urgent Care (no appt availability in office) / [] Appointment(In office/virtual)/ []  [place] [facility]/ [] Home Care/ [] Refused Recommended Disposition /[]  Mobile Bus/ []  Follow-up with PCP Additional Notes: Patient tearful, crying stating she saw on the news a little girl died of the flu and she is afraid to die of the flu. She states she went to the drugstore to pick up OTC cough medication and someone told her she sounds like she has the flu or COVID. Patient agreeable to call 911 and states she feels comfortable enough to call herself.   Copied from CRM [id number]. Topic: Clinical - Red Word Triage >> Aug 29, 2023  9:05 AM [name] wrote: [facility] that prompted transfer to Nurse Triage: Patient is having trouble breathing overnight and now// Reason for Disposition  SEVERE difficulty breathing (e.g., struggling for each breath, speaks in single words)  Answer Assessment - Initial Assessment Questions 1. RESPIRATORY STATUS: Describe your breathing? (e.g., wheezing, shortness of breath, unable to speak, severe coughing)      Coughing a whole lot, trouble breathing real bad  2. ONSET: When did this breathing problem begin?      2-3 days.  3. PATTERN Does the difficult breathing come and go, or has it been constant since it started?      Constant.  4. SEVERITY: How bad is your breathing? (e.g., mild, moderate, severe)    - MILD: No SOB at rest, mild SOB with walking, speaks normally in sentences, can lie down, no retractions, pulse < 100.    - MODERATE: SOB at rest, SOB with minimal exertion and prefers to sit, cannot lie down flat, speaks in phrases, mild retractions, audible wheezing, pulse 100-120.    - SEVERE: Very SOB at  rest, speaks in single words, struggling to breathe, sitting hunched forward, retractions, pulse > 120      All the time when I'm walking or sitting on the bed, the whole time  5. RECURRENT SYMPTOM: Have you had difficulty breathing before? If Yes, ask: When was the last time? and What happened that time?      Denies.  6. CARDIAC HISTORY: Do you have any history of heart disease? (e.g., heart attack, angina, bypass surgery, angioplasty)      Denies.  7. LUNG HISTORY: Do you have any history of lung disease?  (e.g., pulmonary embolus, asthma, emphysema)     Denies.  8. CAUSE: What do you think is causing the breathing problem?      No, I don't think so  9. OTHER SYMPTOMS: Do you have any other symptoms? (e.g., dizziness, runny nose, cough, chest pain, fever)     Cough, feeling hot then cold has not checked her temperature, runny nose, chest pain.  10. O2 SATURATION MONITOR:  Do you use an oxygen  saturation monitor (pulse oximeter) at home? If Yes, ask: What is your reading (oxygen  level) today? What is your usual oxygen  saturation reading? (e.g., 95%)       Denies having one.  12. TRAVEL: Have you traveled out of the country in the last month? (e.g., travel history, exposures)       Denies travel, denies known exposures.  Protocols used: Breathing Difficulty-A-AH

## 2023-08-29 NOTE — ED Notes (Signed)
 Decision made to intubate based on pt's respiratory status; EDP, RT, CRN and writer at bedside. 7.5 ETT placed 21cm @ lip + color change

## 2023-08-29 NOTE — ED Triage Notes (Signed)
 Pt reports 2-3 days ago cough started, Non productive. SOB started 3 days ago with weakness and chills. Pt has low grade fever. Ems reports 92% on 6L. Pt is visibly and audibly labored.Pt reports chills and hot flashes. Pt repots taking robitussin.

## 2023-08-29 NOTE — Progress Notes (Signed)
*  PRELIMINARY RESULTS* Echocardiogram 2D Echocardiogram has been performed.  Bernis Brisker 08/29/2023, 1:22 PM

## 2023-08-29 NOTE — Consult Note (Signed)
 Cardiology Consultation   Patient ID: [name] MRN: [id number]; DOB: [birth year]  Admit date: 08/29/2023 Date of Consult: 08/29/2023  PCP:  Antonetta Rollene BRAVO, MD   [facility] Providers Cardiologist:  New to [facility]   Patient Profile:   [name] is a 43 y.o. 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 Suzette.  History of Present Illness:   [name] presented to [facility] ER this morning for evaluation of worsening dyspnea/orthopnea that began yesterday evening.  Two days prior she began experiencing an intermittent nonproductive cough and exertional fatigue, feeling of chills but not entirely sure whether she has had any fevers.  Also pleuritic chest discomfort.  She does not report any peripheral edema or recent weight change.  No other change in her medications.  Denies any illicit substance use.  Initial labs showed WBC 14.1, Hgb 12.5, platelets 410, absolute neutrophils 13.1, Na+ 124, K+ 4.1 and creatinine 0.71. Albumin low at 3.2. AST 30 and ALT 11. Alkaline phosphatase elevated at 140 but appears chronically elevated as well. BNP mildly elevated at 171. Initial Hs troponin elevated at 225 with repeat values pending. Negative for COVID, influenza and RSV. CXR shows cardiomegaly with diffuse pulmonary interstitial opacity favored to represent acute pulmonary edema and trace pleural fluid along the right minor fissure.  ECG shows sinus tachycardia with anterolateral T wave inversions that are more prominent than prior tracing. She received IV Lasix  40 mg while in the ED and has been started on IV Heparin  for anticoagulation.  Patient still reports shortness of breath at  rest although is able to lie back in bed at this point, less prominent symptoms than the night before.  States that this has never happened before.  Stat echocardiogram obtained in the ER demonstrates LVEF 65 to 70%, difficult to assess wall motion abnormality, cannot completely exclude basal posterolateral hypokinesis.  She does have mild diastolic dysfunction, normal RV contraction, no major valvular abnormalities.  Past Medical History:  Diagnosis Date   Back pain    Chronic headaches    Depression    Fall 03/07/2021   Genital warts    herpes   Herpes    Lip injury 01/25/2021   Lower lip trauma on 01/19/2021 during seizure, treated at [facility] urgent care   Seizures [facility])    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 ([facility])   Vision loss, bilateral 05/08/2021    Past Surgical History:  Procedure Laterality Date   BRAIN SURGERY  07/30/2022   CESAREAN SECTION     x 3     Home Medications:  Prior to Admission medications   Medication Sig Start Date End Date Taking? Authorizing Provider  BRIVIACT  100 MG TABS tablet TAKE 1 TABLET BY MOUTH TWICE DAILY 04/14/23  Yes Georjean Darice CHRISTELLA, MD  busPIRone  (BUSPAR ) 5 MG tablet TAKE 1 TABLET(5 MG) BY MOUTH TWICE DAILY 06/09/23  Yes Antonetta Rollene BRAVO, MD  famciclovir  (FAMVIR ) 250 MG tablet TAKE ONE (1) TABLET BY MOUTH TWICE DAILY 08/25/23  Yes Antonetta Rollene BRAVO, MD  Galcanezumab -gnlm (EMGALITY ) 120 MG/ML SOAJ Inject 1 pen  into the skin every 30 (thirty) days. 02/20/23  Yes Georjean Darice CHRISTELLA, MD  medroxyPROGESTERone  (DEPO-PROVERA ) 150 MG/ML injection ADMINISTER 1 ML(150 MG) IN THE MUSCLE EVERY 3 MONTHS 08/26/23  Yes Signa Nest A, NP  ketoconazole  (NIZORAL ) 2 % cream Apply 1 Application topically 2 (two) times daily as needed for irritation. Patient not taking: Reported on 08/29/2023 05/08/23   Antonetta Rollene BRAVO, MD  meloxicam  (MOBIC ) 15 MG tablet Take 1 tablet (15 mg total) by mouth  daily. Patient not taking: Reported on 08/29/2023 08/14/23   Antonetta Rollene BRAVO, MD  nortriptyline  (PAMELOR ) 25 MG capsule TAKE 2 CAPSULES BY MOUTH EVERY NIGHT 04/01/23   Georjean Darice HERO, MD  oxcarbazepine  (TRILEPTAL ) 600 MG tablet Take 1 tablet two times daily . 02/07/23   Georjean Darice HERO, MD  terbinafine  (LAMISIL ) 250 MG tablet Take 1 tablet (250 mg total) by mouth daily. 01/13/23   Golda Lynwood PARAS, MD  tiZANidine  (ZANAFLEX ) 4 MG tablet TAKE 1 TABLET BY MOUTH EVERY 6 HOURS AS NEEDED FOR MUSCLE SPASMS 11/25/22   Georjean Darice HERO, MD  triamcinolone  (KENALOG ) 0.025 % ointment Apply 1 Application topically 2 (two) times daily as needed (itching). 11/11/22   Golda Lynwood PARAS, MD  Ubrogepant  (UBRELVY ) 100 MG TABS Take 1 tablet as needed at onset of migraine. Do not take more than 3 a week 02/05/23   Georjean Darice HERO, MD  Vitamin D , Ergocalciferol , (DRISDOL ) 1.25 MG (50000 UNIT) CAPS capsule Take 1 capsule (50,000 Units total) by mouth every 7 (seven) days. 08/14/23   Antonetta Rollene BRAVO, MD  zonisamide  (ZONEGRAN ) 100 MG capsule TAKE ONE (1) CAPSULE BY MOUTH ONCE DAILY IN THE MORNING AND TAKE FIVE (5) CAPSULES IN THE EVENING 01/20/23   Georjean Darice HERO, MD    Allergies:    Allergies  Allergen Reactions   Morphine Other (See Comments)    Unknown Reaction      Social History:   Social History   Tobacco Use   Smoking status: Every Day    Current packs/day: 1.00    Average packs/day: 1 pack/day for 10.0 years (10.0 ttl pk-yrs)    Types: Cigarettes    Passive exposure: Current   Smokeless tobacco: Never   Tobacco comments:    Offered Smoking Cessation.  Substance Use Topics   Alcohol use: Not Currently    Alcohol/week: 2.0 standard drinks of alcohol    Types: 2 Cans of beer per week    Comment: last drink in December     Family History:    Family History  Problem Relation Age of Onset   Hypertension Mother    Healthy Father      ROS:  Please see the history of present illness. All other ROS  reviewed and negative.     Physical Exam/Data:   Vitals:   08/29/23 1130 08/29/23 1215 08/29/23 1335 08/29/23 1408  BP: (!) 149/89 (!) 146/93 (!) 148/91   Pulse: (!) 114 (!) 116 (!) 111   Resp: (!) 51 (!) 59 (!) 41   Temp:    98.1 F (36.7 C)  TempSrc:    Oral  SpO2: 95% 92% 96%   Weight:      Height:       No intake or output data in the 24 hours ending 08/29/23 1412    08/29/2023   10:00 AM 08/14/2023    1:01 PM 07/30/2023   10:47 AM  Last 3 Weights  Weight (lbs) 207 lb 3.7 oz 207 lb 1.3 oz 203 lb  Weight (kg) 94 kg 93.931 kg 92.08 kg  Body mass index is 39.16 kg/m.  General: Patient short of breath speaking in full sentences, no acute distress. HEENT: Conjunctiva and lids normal, oropharynx clear. Neck: Supple, difficult to assess JVP, no carotid bruits. Lungs: Coarse breath sounds particularly anteriorly, no egophony. Cardiac: Regular rate and rhythm, no S3, soft systolic murmur, no obvious pericardial rub. Abdomen: Soft, nontender, bowel sounds present. Extremities: No pitting edema, distal pulses 2+. Skin: Warm and dry. Musculoskeletal: No kyphosis. Neuropsychiatric: Alert and oriented x3, affect grossly appropriate.   Telemetry:  Telemetry was personally reviewed and demonstrates: Sinus tachycardia.  Relevant CV Studies:  No prior cardiac testing for review today.  Laboratory Data:  High Sensitivity Troponin:   Recent Labs  Lab 08/29/23 1037  TROPONINIHS 225*     Chemistry Recent Labs  Lab 08/29/23 1000  NA 124*  K 4.1  CL 94*  CO2 16*  GLUCOSE 149*  BUN <5*  CREATININE 0.71  CALCIUM  8.9  GFRNONAA >60  ANIONGAP 14    Recent Labs  Lab 08/29/23 1000  PROT 8.1  ALBUMIN 3.2*  AST 30  ALT 11  ALKPHOS 140*  BILITOT 0.6   Hematology Recent Labs  Lab 08/29/23 1000  WBC 14.1*  RBC 4.00  HGB 12.5  HCT 36.6  MCV 91.5  MCH 31.3  MCHC 34.2  RDW 15.0  PLT 410*    BNP Recent Labs  Lab 08/29/23 1037  BNP 171.0*      Radiology/Studies:  ECHOCARDIOGRAM COMPLETE Result Date: 08/29/2023    ECHOCARDIOGRAM REPORT   Patient Name:   [name] Date of Exam: 08/29/2023 Medical Rec #:  [id number]        Height:       61.0 in Accession #:    [id number]       Weight:       207.2 lb Date of Birth:  1981/02/02        BSA:          1.917 m Patient Age:    42 years         BP:           146/93 mmHg Patient Gender: F                HR:           113 bpm. Exam Location:  [facility] Procedure: 2D Echo, Cardiac Doppler and Color Doppler Indications:    CHF-Acute Systolic I50.21 / Increased Troponins  History:        Patient has no prior history of Echocardiogram examinations.                 Risk Factors:Current Smoker.  Sonographer:    Aida Pizza RCS Referring Phys: [phone] JOSEPH ZAMMIT IMPRESSIONS  1. Left ventricular ejection fraction, by estimation, is 65 to 70%. The left ventricle has normal function. Left ventricular endocardial border not optimally defined to evaluate regional wall motion - cannot exclude basal posterolateral hypokinesis. There is mild asymmetric left ventricular hypertrophy of the septal segment. Left ventricular diastolic parameters are consistent with Grade I diastolic dysfunction (impaired relaxation).  2. Right ventricular systolic function is normal. The right ventricular size is normal. Tricuspid regurgitation signal is inadequate for assessing PA pressure.  3. The mitral valve is grossly normal. Trivial mitral valve regurgitation.  4. The aortic valve is tricuspid. Aortic valve regurgitation is not visualized. No aortic stenosis is present.  5. The inferior vena cava is normal in size with greater than 50% respiratory  variability, suggesting right atrial pressure of 3 mmHg. Comparison(s): No prior Echocardiogram. FINDINGS  Left Ventricle: Left ventricular ejection fraction, by estimation, is 65 to 70%. The left ventricle has normal function. Left ventricular endocardial border not optimally defined to  evaluate regional wall motion. The left ventricular internal cavity size was normal in size. There is mild asymmetric left ventricular hypertrophy of the septal segment. Left ventricular diastolic parameters are consistent with Grade I diastolic dysfunction (impaired relaxation). Right Ventricle: The right ventricular size is normal. No increase in right ventricular wall thickness. Right ventricular systolic function is normal. Tricuspid regurgitation signal is inadequate for assessing PA pressure. Left Atrium: Left atrial size was normal in size. Right Atrium: Right atrial size was normal in size. Pericardium: Trivial pericardial effusion is present. The pericardial effusion is surrounding the apex. Presence of epicardial fat layer. Mitral Valve: The mitral valve is grossly normal. Trivial mitral valve regurgitation. Tricuspid Valve: The tricuspid valve is grossly normal. Tricuspid valve regurgitation is trivial. Aortic Valve: The aortic valve is tricuspid. There is mild aortic valve annular calcification. Aortic valve regurgitation is not visualized. No aortic stenosis is present. Pulmonic Valve: The pulmonic valve was grossly normal. Pulmonic valve regurgitation is trivial. Aorta: The aortic root is normal in size and structure. Venous: The inferior vena cava is normal in size with greater than 50% respiratory variability, suggesting right atrial pressure of 3 mmHg. IAS/Shunts: No atrial level shunt detected by color flow Doppler.  LEFT VENTRICLE PLAX 2D LVIDd:         4.30 cm   Diastology LVIDs:         3.20 cm   LV e' medial:    5.66 cm/s LV PW:         1.00 cm   LV E/e' medial:  11.4 LV IVS:        1.20 cm   LV e' lateral:   11.30 cm/s LVOT diam:     2.00 cm   LV E/e' lateral: 5.7 LV SV:         53 LV SV Index:   28 LVOT Area:     3.14 cm  RIGHT VENTRICLE RV S prime:     18.20 cm/s TAPSE (M-mode): 1.7 cm LEFT ATRIUM             Index        RIGHT ATRIUM           Index LA diam:        3.20 cm 1.67 cm/m   RA  Area:     14.60 cm LA Vol (A2C):   39.9 ml 20.81 ml/m  RA Volume:   41.50 ml  21.64 ml/m LA Vol (A4C):   24.5 ml 12.78 ml/m LA Biplane Vol: 32.4 ml 16.90 ml/m  AORTIC VALVE LVOT Vmax:   119.00 cm/s LVOT Vmean:  70.500 cm/s LVOT VTI:    0.168 m  AORTA Ao Root diam: 3.40 cm MITRAL VALVE MV Area (PHT): 1.53 cm    SHUNTS MV Decel Time: 496 msec    Systemic VTI:  0.17 m MV E velocity: 64.80 cm/s  Systemic Diam: 2.00 cm MV A velocity: 83.60 cm/s MV E/A ratio:  0.78 Jayson Sierras MD Electronically signed by Jayson Sierras MD Signature Date/Time: 08/29/2023/1:33:58 PM    Final    DG Chest Port 1 View Result Date: 08/29/2023 CLINICAL DATA:  43 year old female with shortness of breath, nonproductive cough for 2-3 days. EXAM: PORTABLE CHEST 1 VIEW COMPARISON:  Chest  radiographs 12/26/2014. FINDINGS: Portable AP semi upright view at 1022 hours. Lordotic positioning. Cardiomegaly is new since 2016. Lower lung volumes. Diffuse bilateral pulmonary interstitial opacity, basilar and peripheral predominant and fairly symmetric. Trace fluid in the right minor fissure. No other pleural effusions are evident. No pneumothorax or air bronchograms. No acute osseous abnormality identified. Negative visible bowel gas pattern. IMPRESSION: Cardiomegaly is new since 2016 with diffuse pulmonary interstitial opacity favored to be acute pulmonary edema. Trace pleural fluid in the right minor fissure. Bilateral pneumonia felt less likely. Electronically Signed   By: VEAR Hurst M.D.   On: 08/29/2023 11:01    Assessment and Plan:   1.  Presentation with fairly acute shortness of breath and pleuritic chest pain preceded by 2 days of coughing and reported chills.  BNP 171 and chest x-ray indicating cardiomegaly with diffuse interstitial pulmonary opacities which could represent either pulmonary edema or atypical infection, trace pleural fluid also noted.  Stat echocardiogram demonstrates LVEF 65 to 70%, wall motion not completely defined  but cannot exclude basal posterolateral hypokinesis.  Initial high-sensitivity troponin level 225, ECG shows anterolateral T wave inversions are somewhat more prominent than prior tracing but have been present to some degree previously.  She is in sinus tachycardia at rest.  If acute diastolic heart failure, question is underlying etiology.  Ischemic heart disease is to be considered, although some of her symptoms sound more pulmonary perhaps, and would need to also exclude pulmonary embolus as well as potential myocarditis with viral URI.  She is on IV heparin  for now with further cardiac enzyme trend pending.  Plan is for her to be admitted to the hospitalist service with cardiology following.  2.  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  States that she last had seizures in June and August 2024.  Reports no recent activity and states that she has been taking her medications regularly.  3.  Tobacco use.  4.  Elevated blood pressure, no standing history of primary hypertension.  Reviewed with Dr. Zammit.  Patient to be admitted to the hospitalist team, suggest that this be at [place] so that cardiology service can continue to follow over the weekend.  Continue cardiac enzyme trend and IV heparin .  Adding Lopressor  25 mg twice daily for now.  Also aspirin  81 mg daily.  Check CRP, ESR, UDS, and D-dimer.  If bed not available in reasonable timeframe at [facility], should be admitted to [facility] for follow-up rounding by the hospitalist team as clinical course is evolving and she may need further testing.  For questions or updates, please contact [facility] Please consult [email or web address] for contact info under    Signed, Jayson Sierras, MD  08/29/2023 2:12 PM

## 2023-08-29 NOTE — ED Notes (Signed)
 Pt unable to tolerate ambulation to the [facility]. Pt O2 saturation decreased to 81, HR increased, pt began to cough. PT meets criteria for a female purwick

## 2023-08-29 NOTE — Progress Notes (Signed)
 PHARMACY - ANTICOAGULATION CONSULT NOTE  Pharmacy Consult for heparin  Indication: chest pain/ACS  Allergies  Allergen Reactions   Morphine Other (See Comments)    Unknown Reaction      Patient Measurements: Height: 5' 1 (154.9 cm) Weight: 94 kg (207 lb 3.7 oz) IBW/kg (Calculated) : 47.8 Heparin  Dosing Weight: 70 kg  Vital Signs: Temp: 98.9 F (37.2 C) (02/07 1910) Temp Source: Oral (02/07 1910) BP: 130/87 (02/07 1700) Pulse Rate: 106 (02/07 1700)  Labs: Recent Labs    08/29/23 1000 08/29/23 1037 08/29/23 1258 08/29/23 1812  HGB 12.5  --   --   --   HCT 36.6  --   --   --   PLT 410*  --   --   --   HEPARINUNFRC  --   --   --  <0.10*  CREATININE 0.71  --   --   --   TROPONINIHS  --  225* 210*  --     Estimated Creatinine Clearance: 93.9 mL/min (by C-G formula based on SCr of 0.71 mg/dL).   Medical History: Past Medical History:  Diagnosis Date   Back pain    Chronic headaches    Depression    Fall 03/07/2021   Genital warts    herpes   Herpes    Lip injury 01/25/2021   Lower lip trauma on 01/19/2021 during seizure, treated at [facility] urgent care   Seizures [facility])    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 ([facility])   Vision loss, bilateral 05/08/2021    Medications:  (Not in a hospital admission)   Assessment: Pharmacy consulted to dose heparin  in patient with chest pain/ACS.  Patient is not on anticoagulation prior to admission.  Trop 225 CBC WNL  Date Time Results Comments  08/29/23 1812 HL <0.10 Subtherapeutic, at 950 units -->1100 unit         Goal of Therapy:  Heparin  level 0.3-0.7 units/ml Monitor platelets by anticoagulation protocol: Yes   Plan:  Give 2000 units bolus x 1 Start heparin  infusion at 1100 units/hr Check anti-Xa level in 6 hours and daily while on heparin  Continue to monitor H&H and platelets  Ingris Pasquarella Rodriguez-Guzman PharmD, BCPS 08/29/2023 7:36 PM

## 2023-08-29 NOTE — ED Notes (Signed)
 RT at bedside.

## 2023-08-29 NOTE — H&P (Addendum)
 History and Physical    [name] FMW:[id number] DOB: 1980-12-22 DOA: 08/29/2023  PCP: Antonetta Rollene BRAVO, MD   Patient coming from: Home  Chief Complaint: Cough/dyspnea/chest pain  HPI: [name] is a 43 y.o. 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  2 days prior to this, she was having a cough as well as fatigue and chills, but was unsure of any fevers.  She also complains of some pleuritic chest discomfort.  She denies any hemoptysis, nausea, or vomiting.   ED Course: Vital signs notable for sinus tachycardia as well as ongoing tachypnea.  She is currently on nasal cannula oxygen .  Stat echocardiogram obtained in the ED demonstrates LVEF 65-70% with mild diastolic dysfunction and normal RV contraction.  She was seen by cardiology and D-dimer was ordered which was elevated.  She was given 1 dose of IV Lasix  40 mg while in the ED and has been started on IV heparin  for anticoagulation.  CT chest with angiogram performed now showing evidence of pneumonia, but no PE.  Review of Systems: Reviewed as noted above, otherwise negative.  Past Medical History:  Diagnosis Date   Back pain    Chronic headaches    Depression    Fall 03/07/2021   Genital warts    herpes   Herpes    Lip injury 01/25/2021   Lower lip trauma on 01/19/2021 during seizure, treated at [facility] urgent care   Seizures [facility])    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 ([facility])   Vision loss, bilateral 05/08/2021    Past Surgical History:  Procedure Laterality Date   BRAIN SURGERY  07/30/2022   CESAREAN SECTION     x 3     reports that she has been smoking cigarettes. She has a 10 pack-year smoking history. She  has been exposed to tobacco smoke. She has never used smokeless tobacco. She reports that she does not currently use alcohol after a past usage of about 2.0 standard drinks of alcohol per week. She reports current drug use. Drug: Marijuana.  Allergies  Allergen Reactions   Morphine Other (See Comments)    Unknown Reaction      Family History  Problem Relation Age of Onset   Hypertension Mother    Healthy Father     Prior to Admission medications   Medication Sig Start Date End Date Taking? Authorizing Provider  BRIVIACT  100 MG TABS tablet TAKE 1 TABLET BY MOUTH TWICE DAILY 04/14/23  Yes Georjean Darice CHRISTELLA, MD  busPIRone  (BUSPAR ) 5 MG tablet TAKE 1 TABLET(5 MG) BY MOUTH TWICE DAILY 06/09/23  Yes Antonetta Rollene BRAVO, MD  famciclovir  (FAMVIR ) 250 MG tablet TAKE ONE (1) TABLET BY MOUTH TWICE DAILY 08/25/23  Yes Antonetta Rollene BRAVO, MD  Galcanezumab -gnlm (EMGALITY ) 120 MG/ML SOAJ Inject 1 pen  into the skin every 30 (thirty) days. 02/20/23  Yes Georjean Darice CHRISTELLA, MD  medroxyPROGESTERone  (DEPO-PROVERA ) 150 MG/ML injection ADMINISTER 1 ML(150 MG) IN THE MUSCLE EVERY 3 MONTHS 08/26/23  Yes Signa Nest A, NP  ketoconazole  (NIZORAL ) 2 % cream Apply 1 Application topically 2 (two) times daily as needed for irritation. Patient not taking: Reported on 08/29/2023 05/08/23   Antonetta Rollene BRAVO, MD  meloxicam  (MOBIC ) 15 MG tablet Take 1 tablet (15 mg  total) by mouth daily. Patient not taking: Reported on 08/29/2023 08/14/23   Antonetta Rollene BRAVO, MD  nortriptyline  (PAMELOR ) 25 MG capsule TAKE 2 CAPSULES BY MOUTH EVERY NIGHT 04/01/23   Georjean Darice HERO, MD  oxcarbazepine  (TRILEPTAL ) 600 MG tablet Take 1 tablet two times daily . 02/07/23   Georjean Darice HERO, MD  terbinafine  (LAMISIL ) 250 MG tablet Take 1 tablet (250 mg total) by mouth daily. 01/13/23   Golda Lynwood PARAS, MD  tiZANidine  (ZANAFLEX ) 4 MG tablet TAKE 1 TABLET BY MOUTH EVERY 6 HOURS AS NEEDED FOR MUSCLE SPASMS 11/25/22   Georjean Darice HERO, MD  triamcinolone   (KENALOG ) 0.025 % ointment Apply 1 Application topically 2 (two) times daily as needed (itching). 11/11/22   Golda Lynwood PARAS, MD  Ubrogepant  (UBRELVY ) 100 MG TABS Take 1 tablet as needed at onset of migraine. Do not take more than 3 a week 02/05/23   Georjean Darice HERO, MD  Vitamin D , Ergocalciferol , (DRISDOL ) 1.25 MG (50000 UNIT) CAPS capsule Take 1 capsule (50,000 Units total) by mouth every 7 (seven) days. 08/14/23   Antonetta Rollene BRAVO, MD  zonisamide  (ZONEGRAN ) 100 MG capsule TAKE ONE (1) CAPSULE BY MOUTH ONCE DAILY IN THE MORNING AND TAKE FIVE (5) CAPSULES IN THE EVENING 01/20/23   Georjean Darice HERO, MD    Physical Exam: Vitals:   08/29/23 1630 08/29/23 1645 08/29/23 1650 08/29/23 1700  BP: 134/87 (!) 129/94  130/87  Pulse: (!) 110 (!) 104 (!) 106 (!) 106  Resp: (!) 31 (!) 61 (!) 66 (!) 40  Temp:      TempSrc:      SpO2: 94% 100% 94% 99%  Weight:      Height:        Constitutional: NAD, calm, comfortable, obese Vitals:   08/29/23 1630 08/29/23 1645 08/29/23 1650 08/29/23 1700  BP: 134/87 (!) 129/94  130/87  Pulse: (!) 110 (!) 104 (!) 106 (!) 106  Resp: (!) 31 (!) 61 (!) 66 (!) 40  Temp:      TempSrc:      SpO2: 94% 100% 94% 99%  Weight:      Height:       Eyes: lids and conjunctivae normal Neck: normal, supple Respiratory: Bilateral coarse rales. Normal respiratory effort. No accessory muscle use.  Nasal cannula oxygen . Cardiovascular: Regular rate and rhythm, no murmurs. Abdomen: no tenderness, no distention. Bowel sounds positive.  Musculoskeletal:  No edema. Skin: no rashes, lesions, ulcers.  Psychiatric: Flat affect  Labs on Admission: I have personally reviewed following labs and imaging studies  CBC: Recent Labs  Lab 08/29/23 1000  WBC 14.1*  NEUTROABS 13.1*  HGB 12.5  HCT 36.6  MCV 91.5  PLT 410*   Basic Metabolic Panel: Recent Labs  Lab 08/29/23 1000  NA 124*  K 4.1  CL 94*  CO2 16*  GLUCOSE 149*  BUN <5*  CREATININE 0.71  CALCIUM  8.9    GFR: Estimated Creatinine Clearance: 93.9 mL/min (by C-G formula based on SCr of 0.71 mg/dL). Liver Function Tests: Recent Labs  Lab 08/29/23 1000  AST 30  ALT 11  ALKPHOS 140*  BILITOT 0.6  PROT 8.1  ALBUMIN 3.2*   No results for input(s): LIPASE, AMYLASE in the last 168 hours. No results for input(s): AMMONIA  in the last 168 hours. Coagulation Profile: No results for input(s): INR, PROTIME in the last 168 hours. Cardiac Enzymes: No results for input(s): CKTOTAL, CKMB, CKMBINDEX, TROPONINI in the last 168 hours. BNP (last 3 results) No  results for input(s): PROBNP in the last 8760 hours. HbA1C: No results for input(s): HGBA1C in the last 72 hours. CBG: No results for input(s): GLUCAP in the last 168 hours. Lipid Profile: No results for input(s): CHOL, HDL, LDLCALC, TRIG, CHOLHDL, LDLDIRECT in the last 72 hours. Thyroid  Function Tests: No results for input(s): TSH, T4TOTAL, FREET4, T3FREE, THYROIDAB in the last 72 hours. Anemia Panel: No results for input(s): VITAMINB12, FOLATE, FERRITIN, TIBC, IRON, RETICCTPCT in the last 72 hours. Urine analysis:    Component Value Date/Time   COLORURINE YELLOW 07/26/2016 1436   APPEARANCEUR HAZY (A) 07/26/2016 1436   LABSPEC 1.024 07/26/2016 1436   PHURINE 5.0 07/26/2016 1436   GLUCOSEU NEGATIVE 07/26/2016 1436   HGBUR NEGATIVE 07/26/2016 1436   BILIRUBINUR NEGATIVE 07/26/2016 1436   KETONESUR NEGATIVE 07/26/2016 1436   PROTEINUR 30 (A) 07/26/2016 1436   UROBILINOGEN 0.2 06/09/2013 1825   NITRITE NEGATIVE 07/26/2016 1436   LEUKOCYTESUR NEGATIVE 07/26/2016 1436    Radiological Exams on Admission: CT Angio Chest Pulmonary Embolism (PE) W or WO Contrast Result Date: 08/29/2023 CLINICAL DATA:  Cough and shortness of breath.  Low-grade fever. EXAM: CT ANGIOGRAPHY CHEST WITH CONTRAST TECHNIQUE: Multidetector CT imaging of the chest was performed using the standard protocol  during bolus administration of intravenous contrast. Multiplanar CT image reconstructions and MIPs were obtained to evaluate the vascular anatomy. RADIATION DOSE REDUCTION: This exam was performed according to the departmental dose-optimization program which includes automated exposure control, adjustment of the mA and/or kV according to patient size and/or use of iterative reconstruction technique. CONTRAST:  75mL OMNIPAQUE  IOHEXOL  350 MG/ML SOLN COMPARISON:  Chest x-ray from same day. FINDINGS: Cardiovascular: Satisfactory opacification of the pulmonary arteries to the segmental level. No evidence of pulmonary embolism. Normal heart size. No pericardial effusion. No thoracic aortic aneurysm or dissection. Mediastinum/Nodes: Prominent mediastinal lymph nodes measuring up to 1 cm in short axis are likely reactive. No enlarged axillary lymph nodes. Thyroid  gland, trachea, and esophagus demonstrate no significant findings. Lungs/Pleura: Diffuse ground-glass densities throughout both lungs. Scattered smooth interlobular septal thickening. No pleural effusion or pneumothorax. Upper Abdomen: No acute abnormality. Musculoskeletal: No chest wall abnormality. No acute or significant osseous findings. Review of the MIP images confirms the above findings. IMPRESSION: 1. No evidence of pulmonary embolism. 2. Diffuse ground-glass densities throughout both lungs, concerning for pneumonia. Electronically Signed   By: Elsie ONEIDA Shoulder M.D.   On: 08/29/2023 17:06   ECHOCARDIOGRAM COMPLETE Result Date: 08/29/2023    ECHOCARDIOGRAM REPORT   Patient Name:   TORIAN THOENNES Date of Exam: 08/29/2023 Medical Rec #:  [id number]        Height:       61.0 in Accession #:    [id number]       Weight:       207.2 lb Date of Birth:  01-01-1981        BSA:          1.917 m Patient Age:    42 years         BP:           146/93 mmHg Patient Gender: F                HR:           113 bpm. Exam Location:  [facility] Procedure: 2D Echo, Cardiac  Doppler and Color Doppler Indications:    CHF-Acute Systolic I50.21 / Increased Troponins  History:        Patient has  no prior history of Echocardiogram examinations.                 Risk Factors:Current Smoker.  Sonographer:    Aida Pizza RCS Referring Phys: [phone] JOSEPH ZAMMIT IMPRESSIONS  1. Left ventricular ejection fraction, by estimation, is 65 to 70%. The left ventricle has normal function. Left ventricular endocardial border not optimally defined to evaluate regional wall motion - cannot exclude basal posterolateral hypokinesis. There is mild asymmetric left ventricular hypertrophy of the septal segment. Left ventricular diastolic parameters are consistent with Grade I diastolic dysfunction (impaired relaxation).  2. Right ventricular systolic function is normal. The right ventricular size is normal. Tricuspid regurgitation signal is inadequate for assessing PA pressure.  3. The mitral valve is grossly normal. Trivial mitral valve regurgitation.  4. The aortic valve is tricuspid. Aortic valve regurgitation is not visualized. No aortic stenosis is present.  5. The inferior vena cava is normal in size with greater than 50% respiratory variability, suggesting right atrial pressure of 3 mmHg. Comparison(s): No prior Echocardiogram. FINDINGS  Left Ventricle: Left ventricular ejection fraction, by estimation, is 65 to 70%. The left ventricle has normal function. Left ventricular endocardial border not optimally defined to evaluate regional wall motion. The left ventricular internal cavity size was normal in size. There is mild asymmetric left ventricular hypertrophy of the septal segment. Left ventricular diastolic parameters are consistent with Grade I diastolic dysfunction (impaired relaxation). Right Ventricle: The right ventricular size is normal. No increase in right ventricular wall thickness. Right ventricular systolic function is normal. Tricuspid regurgitation signal is inadequate for assessing PA  pressure. Left Atrium: Left atrial size was normal in size. Right Atrium: Right atrial size was normal in size. Pericardium: Trivial pericardial effusion is present. The pericardial effusion is surrounding the apex. Presence of epicardial fat layer. Mitral Valve: The mitral valve is grossly normal. Trivial mitral valve regurgitation. Tricuspid Valve: The tricuspid valve is grossly normal. Tricuspid valve regurgitation is trivial. Aortic Valve: The aortic valve is tricuspid. There is mild aortic valve annular calcification. Aortic valve regurgitation is not visualized. No aortic stenosis is present. Pulmonic Valve: The pulmonic valve was grossly normal. Pulmonic valve regurgitation is trivial. Aorta: The aortic root is normal in size and structure. Venous: The inferior vena cava is normal in size with greater than 50% respiratory variability, suggesting right atrial pressure of 3 mmHg. IAS/Shunts: No atrial level shunt detected by color flow Doppler.  LEFT VENTRICLE PLAX 2D LVIDd:         4.30 cm   Diastology LVIDs:         3.20 cm   LV e' medial:    5.66 cm/s LV PW:         1.00 cm   LV E/e' medial:  11.4 LV IVS:        1.20 cm   LV e' lateral:   11.30 cm/s LVOT diam:     2.00 cm   LV E/e' lateral: 5.7 LV SV:         53 LV SV Index:   28 LVOT Area:     3.14 cm  RIGHT VENTRICLE RV S prime:     18.20 cm/s TAPSE (M-mode): 1.7 cm LEFT ATRIUM             Index        RIGHT ATRIUM           Index LA diam:        3.20 cm 1.67 cm/m  RA Area:     14.60 cm LA Vol (A2C):   39.9 ml 20.81 ml/m  RA Volume:   41.50 ml  21.64 ml/m LA Vol (A4C):   24.5 ml 12.78 ml/m LA Biplane Vol: 32.4 ml 16.90 ml/m  AORTIC VALVE LVOT Vmax:   119.00 cm/s LVOT Vmean:  70.500 cm/s LVOT VTI:    0.168 m  AORTA Ao Root diam: 3.40 cm MITRAL VALVE MV Area (PHT): 1.53 cm    SHUNTS MV Decel Time: 496 msec    Systemic VTI:  0.17 m MV E velocity: 64.80 cm/s  Systemic Diam: 2.00 cm MV A velocity: 83.60 cm/s MV E/A ratio:  0.78 Jayson Sierras MD  Electronically signed by Jayson Sierras MD Signature Date/Time: 08/29/2023/1:33:58 PM    Final    DG Chest Port 1 View Result Date: 08/29/2023 CLINICAL DATA:  43 year old female with shortness of breath, nonproductive cough for 2-3 days. EXAM: PORTABLE CHEST 1 VIEW COMPARISON:  Chest radiographs 12/26/2014. FINDINGS: Portable AP semi upright view at 1022 hours. Lordotic positioning. Cardiomegaly is new since 2016. Lower lung volumes. Diffuse bilateral pulmonary interstitial opacity, basilar and peripheral predominant and fairly symmetric. Trace fluid in the right minor fissure. No other pleural effusions are evident. No pneumothorax or air bronchograms. No acute osseous abnormality identified. Negative visible bowel gas pattern. IMPRESSION: Cardiomegaly is new since 2016 with diffuse pulmonary interstitial opacity favored to be acute pulmonary edema. Trace pleural fluid in the right minor fissure. Bilateral pneumonia felt less likely. Electronically Signed   By: VEAR Hurst M.D.   On: 08/29/2023 11:01    EKG: Independently reviewed. ST 116bpm.  Assessment/Plan Principal Problem:   Acute hypoxemic respiratory failure (HCC) Active Problems:   Convulsions (HCC)   Seizure (HCC)   GAD (generalized anxiety disorder)   Morbid obesity (HCC)   Elevated troponin    Acute hypoxemic respiratory failure-multifactorial with pulmonary edema versus sepsis, POA, secondary to pneumonia -Seen by cardiology with LVEF preserved on 2D echocardiogram and grade 1 diastolic dysfunction -Given 1 dose of IV Lasix  -CT PE study negative for pulmonary embolus -Check procalcitonin and lactic acid -Initiate Rocephin  and azithromycin  empirically and check blood cultures  Elevated troponin related to above -Possibly in the setting of diastolic heart failure versus pneumonia -2D echocardiogram performed as noted above with preserved LVEF and no wall motion abnormalities -Continue IV heparin  drip and transfer to [facility] for  continued cardiology evaluation -Continue metoprolol  25 mg twice daily for now which will also help blood pressure -Aspirin  81 mg daily   History of epilepsy -Continue home medications  Chronic hyponatremia -Noted to have chronic low sodium in the 120 range -Continue to monitor closely -Appears to be hypervolemic and given Lasix  in the ED  Ongoing tobacco abuse -Counseled on cessation  Class II obesity -BMI 39.16   DVT prophylaxis: Heparin  drip Code Status: Full Family Communication: Brother and sister at bedside Disposition Plan: Transfer to [facility] for cardiology evaluation Consults called:Cardiology Admission status: Inpatient, telemetry  Severity of Illness: The appropriate patient status for this patient is INPATIENT. Inpatient status is judged to be reasonable and necessary in order to provide the required intensity of service to ensure the patient's safety. The patient's presenting symptoms, physical exam findings, and initial radiographic and laboratory data in the context of their chronic comorbidities is felt to place them at high risk for further clinical deterioration. Furthermore, it is not anticipated that the patient will be medically stable for discharge from the hospital within 2  midnights of admission.   * I certify that at the point of admission it is my clinical judgment that the patient will require inpatient hospital care spanning beyond 2 midnights from the point of admission due to high intensity of service, high risk for further deterioration and high frequency of surveillance required.*   Sari Cogan D Gricel Copen DO [facility]  If 7PM-7AM, please contact night-coverage [email or web address]  08/29/2023, 5:48 PM

## 2023-08-29 NOTE — ED Notes (Signed)
 Abx delayed due to difficulty getting blood cultures

## 2023-08-29 NOTE — Progress Notes (Signed)
 Notified by [facility] that they are at [facility] to transport patient here to [facility] but her RR is 50. I advised to hold off on transport until she is reassessed by a physician there at [facility].

## 2023-08-30 ENCOUNTER — Inpatient Hospital Stay (HOSPITAL_COMMUNITY): Payer: 59

## 2023-08-30 DIAGNOSIS — J9601 Acute respiratory failure with hypoxia: Secondary | ICD-10-CM | POA: Diagnosis not present

## 2023-08-30 DIAGNOSIS — I214 Non-ST elevation (NSTEMI) myocardial infarction: Secondary | ICD-10-CM

## 2023-08-30 DIAGNOSIS — E871 Hypo-osmolality and hyponatremia: Secondary | ICD-10-CM

## 2023-08-30 LAB — RESPIRATORY PANEL BY PCR

## 2023-08-30 LAB — GLUCOSE, CAPILLARY
Glucose-Capillary: 103 mg/dL — ABNORMAL HIGH (ref 70–99)
Glucose-Capillary: 106 mg/dL — ABNORMAL HIGH (ref 70–99)
Glucose-Capillary: 126 mg/dL — ABNORMAL HIGH (ref 70–99)
Glucose-Capillary: 127 mg/dL — ABNORMAL HIGH (ref 70–99)
Glucose-Capillary: 138 mg/dL — ABNORMAL HIGH (ref 70–99)
Glucose-Capillary: 138 mg/dL — ABNORMAL HIGH (ref 70–99)
Glucose-Capillary: 139 mg/dL — ABNORMAL HIGH (ref 70–99)

## 2023-08-30 LAB — POCT I-STAT 7, (LYTES, BLD GAS, ICA,H+H)
Acid-base deficit: 9 mmol/L — ABNORMAL HIGH (ref 0.0–2.0)
Acid-base deficit: 7 mmol/L — ABNORMAL HIGH (ref 0.0–2.0)
Bicarbonate: 17.5 mmol/L — ABNORMAL LOW (ref 20.0–28.0)
Bicarbonate: 18.7 mmol/L — ABNORMAL LOW (ref 20.0–28.0)
Calcium, Ion: 1.22 mmol/L (ref 1.15–1.40)
Calcium, Ion: 1.22 mmol/L (ref 1.15–1.40)
HCT: 39 % (ref 36.0–46.0)
HCT: 32 % — ABNORMAL LOW (ref 36.0–46.0)
Hemoglobin: 13.3 g/dL (ref 12.0–15.0)
Hemoglobin: 10.9 g/dL — ABNORMAL LOW (ref 12.0–15.0)
O2 Saturation: 99 %
O2 Saturation: 96 %
Patient temperature: 37.1
Patient temperature: 98.6
Potassium: 4.7 mmol/L (ref 3.5–5.1)
Potassium: 4 mmol/L (ref 3.5–5.1)
Sodium: 129 mmol/L — ABNORMAL LOW (ref 135–145)
Sodium: 130 mmol/L — ABNORMAL LOW (ref 135–145)
TCO2: 19 mmol/L — ABNORMAL LOW (ref 22–32)
TCO2: 20 mmol/L — ABNORMAL LOW (ref 22–32)
pCO2 arterial: 40 mm[Hg] (ref 32–48)
pCO2 arterial: 38.2 mm[Hg] (ref 32–48)
pH, Arterial: 7.248 — ABNORMAL LOW (ref 7.35–7.45)
pH, Arterial: 7.296 — ABNORMAL LOW (ref 7.35–7.45)
pO2, Arterial: 141 mm[Hg] — ABNORMAL HIGH (ref 83–108)
pO2, Arterial: 90 mm[Hg] (ref 83–108)

## 2023-08-30 LAB — CBC
HCT: 35.6 % — ABNORMAL LOW (ref 36.0–46.0)
HCT: 34.3 % — ABNORMAL LOW (ref 36.0–46.0)
Hemoglobin: 12.1 g/dL (ref 12.0–15.0)
Hemoglobin: 11.4 g/dL — ABNORMAL LOW (ref 12.0–15.0)
MCH: 30.9 pg (ref 26.0–34.0)
MCH: 30.1 pg (ref 26.0–34.0)
MCHC: 34 g/dL (ref 30.0–36.0)
MCHC: 33.2 g/dL (ref 30.0–36.0)
MCV: 91 fL (ref 80.0–100.0)
MCV: 90.5 fL (ref 80.0–100.0)
Platelets: 417 10*3/uL — ABNORMAL HIGH (ref 150–400)
Platelets: 384 10*3/uL (ref 150–400)
RBC: 3.91 MIL/uL (ref 3.87–5.11)
RBC: 3.79 MIL/uL — ABNORMAL LOW (ref 3.87–5.11)
RDW: 15.4 % (ref 11.5–15.5)
RDW: 15.4 % (ref 11.5–15.5)
WBC: 14 10*3/uL — ABNORMAL HIGH (ref 4.0–10.5)
WBC: 10.3 10*3/uL (ref 4.0–10.5)
nRBC: 0 % (ref 0.0–0.2)
nRBC: 0 % (ref 0.0–0.2)

## 2023-08-30 LAB — BASIC METABOLIC PANEL
Anion gap: 13 (ref 5–15)
Anion gap: 14 (ref 5–15)
Anion gap: 14 (ref 5–15)
BUN: 8 mg/dL (ref 6–20)
BUN: 10 mg/dL (ref 6–20)
BUN: 15 mg/dL (ref 6–20)
CO2: 16 mmol/L — ABNORMAL LOW (ref 22–32)
CO2: 14 mmol/L — ABNORMAL LOW (ref 22–32)
CO2: 17 mmol/L — ABNORMAL LOW (ref 22–32)
Calcium: 8.5 mg/dL — ABNORMAL LOW (ref 8.9–10.3)
Calcium: 8.8 mg/dL — ABNORMAL LOW (ref 8.9–10.3)
Calcium: 8.8 mg/dL — ABNORMAL LOW (ref 8.9–10.3)
Chloride: 97 mmol/L — ABNORMAL LOW (ref 98–111)
Chloride: 100 mmol/L (ref 98–111)
Chloride: 100 mmol/L (ref 98–111)
Creatinine, Ser: 0.94 mg/dL (ref 0.44–1.00)
Creatinine, Ser: 0.93 mg/dL (ref 0.44–1.00)
Creatinine, Ser: 1.12 mg/dL — ABNORMAL HIGH (ref 0.44–1.00)
GFR, Estimated: >60 mL/min (ref >60)
GFR, Estimated: >60 mL/min (ref >60)
GFR, Estimated: >60 mL/min (ref >60)
Glucose, Bld: 144 mg/dL — ABNORMAL HIGH (ref 70–99)
Glucose, Bld: 144 mg/dL — ABNORMAL HIGH (ref 70–99)
Glucose, Bld: 119 mg/dL — ABNORMAL HIGH (ref 70–99)
Potassium: 4.5 mmol/L (ref 3.5–5.1)
Potassium: 4.4 mmol/L (ref 3.5–5.1)
Potassium: 4.5 mmol/L (ref 3.5–5.1)
Sodium: 126 mmol/L — ABNORMAL LOW (ref 135–145)
Sodium: 128 mmol/L — ABNORMAL LOW (ref 135–145)
Sodium: 131 mmol/L — ABNORMAL LOW (ref 135–145)

## 2023-08-30 LAB — BLOOD GAS, ARTERIAL
Acid-base deficit: 7.3 mmol/L — ABNORMAL HIGH (ref 0.0–2.0)
Bicarbonate: 20.1 mmol/L (ref 20.0–28.0)
Drawn by: 22223
O2 Saturation: 99.1 %
Patient temperature: 37
pCO2 arterial: 47 mm[Hg] (ref 32–48)
pH, Arterial: 7.24 — ABNORMAL LOW (ref 7.35–7.45)
pO2, Arterial: 151 mm[Hg] — ABNORMAL HIGH (ref 83–108)

## 2023-08-30 LAB — URINALYSIS, ROUTINE W REFLEX MICROSCOPIC
Bacteria, UA: NONE SEEN
Bilirubin Urine: NEGATIVE
Glucose, UA: NEGATIVE mg/dL
Ketones, ur: NEGATIVE mg/dL
Leukocytes,Ua: NEGATIVE
Nitrite: NEGATIVE
Protein, ur: NEGATIVE mg/dL
Specific Gravity, Urine: 1.017 (ref 1.005–1.030)
pH: 5 (ref 5.0–8.0)

## 2023-08-30 LAB — RAPID URINE DRUG SCREEN, HOSP PERFORMED
Amphetamines: NOT DETECTED
Barbiturates: NOT DETECTED
Benzodiazepines: NOT DETECTED
Cocaine: NOT DETECTED
Opiates: NOT DETECTED
Tetrahydrocannabinol: POSITIVE — AB

## 2023-08-30 LAB — LACTIC ACID, PLASMA
Lactic Acid, Venous: 1.3 mmol/L (ref 0.5–1.9)
Lactic Acid, Venous: 1.6 mmol/L (ref 0.5–1.9)
Lactic Acid, Venous: 2.1 mmol/L (ref 0.5–1.9)

## 2023-08-30 LAB — PROCALCITONIN: Procalcitonin: 0.38 ng/mL

## 2023-08-30 LAB — LIPID PANEL
Cholesterol: 160 mg/dL (ref 0–200)
HDL: 97 mg/dL (ref >40)
LDL Cholesterol: 50 mg/dL (ref 0–99)
Total CHOL/HDL Ratio: 1.6 {ratio}
Triglycerides: 65 mg/dL (ref <150)
VLDL: 13 mg/dL (ref 0–40)

## 2023-08-30 LAB — HEMOGLOBIN A1C
Hgb A1c MFr Bld: 5.5 % (ref 4.8–5.6)
Mean Plasma Glucose: 111.15 mg/dL

## 2023-08-30 LAB — TROPONIN I (HIGH SENSITIVITY): Troponin I (High Sensitivity): 156 ng/L (ref <18)

## 2023-08-30 LAB — MRSA NEXT GEN BY PCR, NASAL: MRSA by PCR Next Gen: DETECTED — AB

## 2023-08-30 LAB — MAGNESIUM
Magnesium: 2.2 mg/dL (ref 1.7–2.4)
Magnesium: 2.2 mg/dL (ref 1.7–2.4)
Magnesium: 2.2 mg/dL (ref 1.7–2.4)

## 2023-08-30 LAB — C-REACTIVE PROTEIN: CRP: 36.9 mg/dL — ABNORMAL HIGH (ref <1.0)

## 2023-08-30 LAB — HEPARIN LEVEL (UNFRACTIONATED)
Heparin Unfractionated: 0.25 [IU]/mL — ABNORMAL LOW (ref 0.30–0.70)
Heparin Unfractionated: 0.3 [IU]/mL (ref 0.30–0.70)

## 2023-08-30 LAB — PHOSPHORUS
Phosphorus: 5.6 mg/dL — ABNORMAL HIGH (ref 2.5–4.6)
Phosphorus: 4.9 mg/dL — ABNORMAL HIGH (ref 2.5–4.6)
Phosphorus: 5.3 mg/dL — ABNORMAL HIGH (ref 2.5–4.6)

## 2023-08-30 LAB — HIV ANTIBODY (ROUTINE TESTING W REFLEX): HIV Screen 4th Generation wRfx: NONREACTIVE

## 2023-08-30 LAB — STREP PNEUMONIAE URINARY ANTIGEN: Strep Pneumo Urinary Antigen: NEGATIVE

## 2023-08-30 MED ORDER — NORTRIPTYLINE HCL 25 MG PO CAPS
25.0000 mg | ORAL_CAPSULE | Freq: Every day | ORAL | Status: DC
  Administered 2023-08-30 – 2023-08-31 (×2): 25 mg
  Filled 2023-08-30 (×3): qty 1

## 2023-08-30 MED ORDER — VANCOMYCIN HCL 1500 MG/300ML IV SOLN
1500.0000 mg | Freq: Once | INTRAVENOUS | Status: AC
Start: 2023-08-30 — End: 2023-08-30
  Administered 2023-08-30: 1500 mg via INTRAVENOUS
  Filled 2023-08-30: qty 300

## 2023-08-30 MED ORDER — OXCARBAZEPINE 300 MG PO TABS
600.0000 mg | ORAL_TABLET | Freq: Two times a day (BID) | ORAL | Status: DC
  Administered 2023-08-30 – 2023-09-01 (×4): 600 mg
  Filled 2023-08-30 (×6): qty 2

## 2023-08-30 MED ORDER — CHLORHEXIDINE GLUCONATE CLOTH 2 % EX PADS
6.0000 | MEDICATED_PAD | Freq: Every day | CUTANEOUS | Status: DC
  Administered 2023-08-30 – 2023-09-01 (×3): 6 via TOPICAL

## 2023-08-30 MED ORDER — ACETAMINOPHEN 650 MG RE SUPP: 650.0000 mg | Freq: Four times a day (QID) | RECTAL | Status: DC | PRN

## 2023-08-30 MED ORDER — ORAL CARE MOUTH RINSE: 15.0000 mL | OROMUCOSAL | Status: DC | PRN

## 2023-08-30 MED ORDER — LACTATED RINGERS IV SOLN: INTRAVENOUS | Status: AC

## 2023-08-30 MED ORDER — ORAL CARE MOUTH RINSE
15.0000 mL | OROMUCOSAL | Status: DC
  Administered 2023-08-30 – 2023-09-04 (×40): 15 mL via OROMUCOSAL

## 2023-08-30 MED ORDER — METOPROLOL TARTRATE 25 MG PO TABS: 25.0000 mg | ORAL_TABLET | Freq: Two times a day (BID) | ORAL | Status: DC

## 2023-08-30 MED ORDER — POLYETHYLENE GLYCOL 3350 17 G PO PACK
17.0000 g | PACK | Freq: Every day | ORAL | Status: DC
  Administered 2023-08-30 – 2023-09-01 (×3): 17 g
  Filled 2023-08-30 (×6): qty 1

## 2023-08-30 MED ORDER — HEPARIN BOLUS VIA INFUSION
2000.0000 [IU] | Freq: Once | INTRAVENOUS | Status: AC
  Administered 2023-08-30: 2000 [IU] via INTRAVENOUS

## 2023-08-30 MED ORDER — LEVALBUTEROL HCL 0.63 MG/3ML IN NEBU
0.6300 mg | INHALATION_SOLUTION | Freq: Four times a day (QID) | RESPIRATORY_TRACT | Status: DC | PRN
  Administered 2023-08-30 – 2023-08-31 (×3): 0.63 mg via RESPIRATORY_TRACT
  Filled 2023-08-30 (×4): qty 3

## 2023-08-30 MED ORDER — ONDANSETRON HCL 4 MG PO TABS: 4.0000 mg | ORAL_TABLET | Freq: Four times a day (QID) | ORAL | Status: DC | PRN

## 2023-08-30 MED ORDER — BUSPIRONE HCL 5 MG PO TABS
5.0000 mg | ORAL_TABLET | Freq: Two times a day (BID) | ORAL | Status: DC
  Administered 2023-08-30 – 2023-09-01 (×4): 5 mg
  Filled 2023-08-30 (×4): qty 1

## 2023-08-30 MED ORDER — FAMOTIDINE 20 MG PO TABS
20.0000 mg | ORAL_TABLET | Freq: Two times a day (BID) | ORAL | Status: DC
  Administered 2023-08-30 – 2023-09-01 (×6): 20 mg
  Filled 2023-08-30 (×6): qty 1

## 2023-08-30 MED ORDER — LEVALBUTEROL HCL 0.63 MG/3ML IN NEBU
0.6300 mg | INHALATION_SOLUTION | Freq: Four times a day (QID) | RESPIRATORY_TRACT | Status: DC
  Administered 2023-08-30 (×3): 0.63 mg via RESPIRATORY_TRACT
  Filled 2023-08-30 (×3): qty 3

## 2023-08-30 MED ORDER — ASPIRIN 81 MG PO CHEW
81.0000 mg | CHEWABLE_TABLET | Freq: Every day | ORAL | Status: DC
  Administered 2023-08-31 – 2023-09-01 (×2): 81 mg
  Filled 2023-08-30 (×2): qty 1

## 2023-08-30 MED ORDER — ZONISAMIDE 100 MG PO CAPS
100.0000 mg | ORAL_CAPSULE | Freq: Two times a day (BID) | ORAL | Status: DC
  Administered 2023-08-30 – 2023-08-31 (×4): 100 mg via ORAL
  Filled 2023-08-30 (×5): qty 1

## 2023-08-30 MED ORDER — METHYLPREDNISOLONE SODIUM SUCC 125 MG IJ SOLR
120.0000 mg | Freq: Every day | INTRAMUSCULAR | Status: DC
Start: 2023-08-30 — End: 2023-08-30

## 2023-08-30 MED ORDER — INSULIN ASPART 100 UNIT/ML IJ SOLN
0.0000 [IU] | INTRAMUSCULAR | Status: DC
  Administered 2023-08-30 – 2023-09-02 (×8): 2 [IU] via SUBCUTANEOUS

## 2023-08-30 MED ORDER — LACTATED RINGERS IV BOLUS
1000.0000 mL | Freq: Once | INTRAVENOUS | Status: AC
  Administered 2023-08-30: 1000 mL via INTRAVENOUS

## 2023-08-30 MED ORDER — METHYLPREDNISOLONE SODIUM SUCC 40 MG IJ SOLR
40.0000 mg | Freq: Every day | INTRAMUSCULAR | Status: DC
Start: 2023-08-31 — End: 2023-08-31
  Administered 2023-08-31: 40 mg via INTRAVENOUS
  Filled 2023-08-30: qty 1

## 2023-08-30 MED ORDER — ONDANSETRON HCL 4 MG/2ML IJ SOLN: 4.0000 mg | Freq: Four times a day (QID) | INTRAMUSCULAR | Status: DC | PRN

## 2023-08-30 MED ORDER — ATORVASTATIN CALCIUM 40 MG PO TABS
40.0000 mg | ORAL_TABLET | Freq: Every day | ORAL | Status: DC
  Administered 2023-08-30 – 2023-09-01 (×3): 40 mg
  Filled 2023-08-30 (×3): qty 1

## 2023-08-30 MED ORDER — REVEFENACIN 175 MCG/3ML IN SOLN
175.0000 ug | Freq: Every day | RESPIRATORY_TRACT | Status: DC
  Administered 2023-08-30 – 2023-09-05 (×7): 175 ug via RESPIRATORY_TRACT
  Filled 2023-08-30 (×7): qty 3

## 2023-08-30 MED ORDER — VITAL HIGH PROTEIN PO LIQD
1000.0000 mL | ORAL | Status: DC
  Administered 2023-08-30 – 2023-09-01 (×3): 1000 mL

## 2023-08-30 MED ORDER — FUROSEMIDE 10 MG/ML IJ SOLN
60.0000 mg | Freq: Once | INTRAMUSCULAR | Status: AC
  Administered 2023-08-30: 60 mg via INTRAVENOUS
  Filled 2023-08-30: qty 6

## 2023-08-30 MED ORDER — ARFORMOTEROL TARTRATE 15 MCG/2ML IN NEBU
15.0000 ug | INHALATION_SOLUTION | Freq: Two times a day (BID) | RESPIRATORY_TRACT | Status: DC
  Administered 2023-08-30 – 2023-09-05 (×11): 15 ug via RESPIRATORY_TRACT
  Filled 2023-08-30 (×12): qty 2

## 2023-08-30 MED ORDER — ACETAMINOPHEN 325 MG PO TABS: 650.0000 mg | ORAL_TABLET | Freq: Four times a day (QID) | ORAL | Status: DC | PRN

## 2023-08-30 MED ORDER — BUDESONIDE 0.5 MG/2ML IN SUSP
0.5000 mg | Freq: Two times a day (BID) | RESPIRATORY_TRACT | Status: DC
  Administered 2023-08-30 – 2023-09-05 (×11): 0.5 mg via RESPIRATORY_TRACT
  Filled 2023-08-30 (×12): qty 2

## 2023-08-30 MED ORDER — DOCUSATE SODIUM 50 MG/5ML PO LIQD
100.0000 mg | Freq: Two times a day (BID) | ORAL | Status: DC
  Administered 2023-08-30 – 2023-09-01 (×6): 100 mg
  Filled 2023-08-30 (×6): qty 10

## 2023-08-30 MED ORDER — PROSOURCE TF20 ENFIT COMPATIBL EN LIQD
60.0000 mL | Freq: Every day | ENTERAL | Status: DC
  Administered 2023-08-30 – 2023-09-01 (×3): 60 mL
  Filled 2023-08-30 (×3): qty 60

## 2023-08-30 MED ORDER — METHYLPREDNISOLONE SODIUM SUCC 125 MG IJ SOLR
80.0000 mg | Freq: Every day | INTRAMUSCULAR | Status: DC
  Administered 2023-08-30: 80 mg via INTRAVENOUS
  Filled 2023-08-30: qty 2

## 2023-08-30 MED ORDER — METOPROLOL TARTRATE 12.5 MG HALF TABLET
12.5000 mg | ORAL_TABLET | Freq: Two times a day (BID) | ORAL | Status: DC
  Administered 2023-08-30 – 2023-09-01 (×4): 12.5 mg
  Filled 2023-08-30 (×4): qty 1

## 2023-08-30 MED ORDER — MUPIROCIN 2 % EX OINT
1.0000 | TOPICAL_OINTMENT | Freq: Two times a day (BID) | CUTANEOUS | Status: AC
  Administered 2023-08-30 – 2023-09-04 (×10): 1 via NASAL
  Filled 2023-08-30 (×2): qty 22

## 2023-08-30 NOTE — Progress Notes (Signed)
 PHARMACY - ANTICOAGULATION CONSULT NOTE  Pharmacy Consult for heparin  Indication: chest pain/ACS  Allergies  Allergen Reactions   Morphine Other (See Comments)    Unknown Reaction      Patient Measurements: Height: 5' 1 (154.9 cm) Weight: 94 kg (207 lb 3.7 oz) IBW/kg (Calculated) : 47.8 Heparin  Dosing Weight: 70 kg  Vital Signs: Temp: 100.1 F (37.8 C) (02/08 0040) Temp Source: Axillary (02/07 2257) BP: 129/90 (02/08 0040) Pulse Rate: 100 (02/08 0040)  Labs: Recent Labs    08/29/23 1000 08/29/23 1037 08/29/23 1258 08/29/23 1812 08/29/23 2009 08/29/23 2321  HGB 12.5  --   --   --   --   --   HCT 36.6  --   --   --   --   --   PLT 410*  --   --   --   --   --   HEPARINUNFRC  --   --   --  <0.10*  --  0.24*  CREATININE 0.71  --   --   --  0.75  --   TROPONINIHS  --  225* 210*  --   --   --     Estimated Creatinine Clearance: 93.9 mL/min (by C-G formula based on SCr of 0.75 mg/dL).   Medical History: Past Medical History:  Diagnosis Date   Back pain    Chronic headaches    Depression    Fall 03/07/2021   Genital warts    herpes   Herpes    Lip injury 01/25/2021   Lower lip trauma on 01/19/2021 during seizure, treated at [facility] urgent care   Seizures [facility])    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 ([facility])   Vision loss, bilateral 05/08/2021    Medications:  (Not in a hospital admission)   Assessment: Pharmacy consulted to dose heparin  in patient with chest pain/ACS.  Patient is not on anticoagulation prior to admission.  Trop 225 CBC WNL  2/8 AM update:  Heparin  level sub-therapeutic    Goal of Therapy:  Heparin  level 0.3-0.7 units/ml Monitor platelets by anticoagulation protocol: Yes   Plan:  Heparin  2000 units re-[employer] heparin  to 1200 units/hr 8 hour heparin  level  Lynwood Mckusick, PharmD, BCPS Clinical Pharmacist Phone: [phone]

## 2023-08-30 NOTE — Progress Notes (Signed)
 Breathing treatment  Yuprelri, PRN Xopenex ,  and Dose of PRN Fentanyl  given. Patient continues to belly breath and not have a synchronous wave form on the vent. Respiratory rate at 24-30 and lungs sound slight better but nothing to brag about post breathing treatment.  [facility] notified.

## 2023-08-30 NOTE — Progress Notes (Addendum)
 [facility] Physician-Brief Progress Note Patient Name: [name] DOB: 07-15-1981 MRN: [id number]   Date of Service  08/30/2023  HPI/Events of Note  Patient admitted with pneumonia vs pulmonary  edema, sepsis and acute hypoxemic respiratory failure requiring intubation and mechanical ventilation, work up in progress.  eICU Interventions  New Patient Evaluation.        [name] U Chaquetta Schlottman 08/30/2023, 2:12 AM

## 2023-08-30 NOTE — Progress Notes (Addendum)
 [facility] Physician-Brief Progress Note Patient Name: [name] DOB: 07-03-1981 MRN: [id number]   Date of Service  08/30/2023  HPI/Events of Note  43 year old female with pertinent PMH seizures secondary to prior left temporal lobe encephalocele s/p resection at [facility] 07/2022, chronic hyponatremia, tobacco abuse (0.5 ppd daily), smokes marijuana, and depression presents to [facility] ED on 2/7 with SOB, CP, chills, fatigue, and dry cough for 2-3 days   Has metropolol due with BP 97/68 (79)  Intubated with TV set to 330. She is really pulling on the vent volumes in the 400s    eICU Interventions  Reduce dose of metoprolol  not currently in shock  She seems to be ventilating above the set rate and about a higher volume.  Last pCO2 limits.  No indication for changes at this time. (8cc/kg is 380 and she normally stays under that)     2157 -  patient has received multiple breathing treatments and continues to be wheezy.  Her driving pressure is 3 cm per H2O.  Bedside is very concerned about the patient.  Recommend reducing the PEEP given adequate oxygenation and switching to auto mode.  Her last ABG shows primarily of metabolic acidosis.  Intervention Category Intermediate Interventions: Hypotension - evaluation and management  Zendaya Groseclose 08/30/2023, 8:53 PM

## 2023-08-30 NOTE — ED Provider Notes (Signed)
  Physical Exam  BP 114/74   Pulse 93   Temp (!) 100.7 F (38.2 C) (Axillary)   Resp (!) 41   Ht 5' 1 (1.549 m)   Wt 94 kg   SpO2 100%   BMI 39.16 kg/m   Physical Exam  Procedures  Procedure Name: Intubation Date/Time: 08/30/2023 12:03 AM  Performed by: Patsey Lot, MDPre-anesthesia Checklist: Patient identified, Emergency Drugs available, Patient being monitored, Timeout performed and Suction available Preoxygenation: Pre-oxygenation with 100% oxygen  Induction Type: Rapid sequence Ventilation: Mask ventilation with difficulty Laryngoscope Size: Glidescope and 4 Grade View: Grade II Tube size: 7.5 mm Number of attempts: 1 Placement Confirmation: ETT inserted through vocal cords under direct vision and CO2 detector Secured at: 21 cm Tube secured with: ETT holder Dental Injury: Teeth and Oropharynx as per pre-operative assessment  Comments: Had partial denture that was removed prior to intubation. Intubation itself was easy, however difficulty bagging once patient was paralyzed.      ED Course / MDM    Medical Decision Making Amount and/or Complexity of Data Reviewed Labs: ordered. Radiology: ordered.  Risk Prescription drug management. Decision regarding hospitalization.   During the shift had been discussed with Dr. Meredeth and Dr. Manfred about worsening shortness of breath and dyspnea.  Mental status is decreasing.  ABG reviewed and does show that she is blowing off CO2.  Had been taken off BiPAP and mental status if anything decreased more.  Probably getting tired.  Hospitalist discussed with ICU and intubation requested for transfer.       Patsey Lot, MD 08/30/23 [phone]

## 2023-08-30 NOTE — Progress Notes (Signed)
 NAME:  POSIE LILLIBRIDGE, MRN:  [id number], DOB:  23-Oct-1980, LOS: 1 ADMISSION DATE:  08/29/2023, CONSULTATION DATE:  2/8 REFERRING MD:  Dr. Manfred, CHIEF COMPLAINT:  respiratory failure   History of Present Illness:  A 43 year old female with pertinent PMH seizures secondary to prior left temporal lobe encephalocele s/p resection at [facility] 07/2022, chronic hyponatremia, tobacco abuse (0.5 ppd daily), smokes marijuana, and depression presents to [facility] ED on 2/7 with SOB, CP, chills, fatigue, and dry cough for 2-3 days without f/c/r, N/V/D, abd pain, rash, wheezing, or recent traveling. No alcohol or illicit drug use. Patient began having some pleuritic chest pain and came to [facility] ED on 2/7 for further eval.  On arrival patient was sinus tachycardia 110s and tachypneic with RR in 30-40s.  Initial temp 99 F.  Patient sats improved on [place].  CXR with cardiomegaly and diffuse pulmonary  opacity likely pulmonary edema less likely pneumonia.  Given Lasix .  COVID, flu, RSV negative.   D-dimer 1.99, BNP 171, troponin 225 then 210.  EKG with no ST elevation; anterolateral T wave inversions.  CTA chest with no PE; diffuse groundglass densities concerning for pneumonia.  WBC 14.1 and PCT 0.44.  Cultures obtained and placed on Rocephin  and azithromycin  for possible CAP.  Cardiology consulted.  Stat echo showing LVEF 65 to 70%; difficult to assess wall motion abnormality; mild diastolic dysfunction.  Patient started on heparin  IV for NSTEMI.  Later in evening patient with worsening respiratory distress and placed on BiPAP.  ABG 7.4, 28, 140, 17. Given albuterol neb and started on IV steroids.  Given repeat Lasix .  Patient still tachypneic with RR in 50s. Patient intubated and placed on vent.  PCCM consulted for ICU transfer to [facility].  Pertinent  Medical History   Past Medical History:  Diagnosis Date   Back pain    Chronic headaches    Depression    Fall 03/07/2021   Genital warts    herpes   Herpes    Lip injury  01/25/2021   Lower lip trauma on 01/19/2021 during seizure, treated at [facility] urgent care   Seizures [facility])    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 ([facility])   Vision loss, bilateral 05/08/2021     Significant Hospital Events: Including procedures, antibiotic start and stop dates in addition to other pertinent events   2/7 admitted to [facility] with chest pain and SOB Intubated  2/8 now at [facility].  Continuing antibiotics, IV diuresis, systemic steroids, following up on cultures remains on heparin . RVP positive for rhinovirus, ustrep neg, MRSA PCR pos. Sig bronchospasm. BDs and ICS optimized.  2/9 still having some vent synchrony difficulty. Increased prop ceiling to 75. Changed vent to PCV seems to be more comfortable. Metabolic acidosis improved. Still w/ sig wheeze. Change steroids to 40mg  bid   Interim History / Subjective:  No distress. Weaning vent support some now that she is on PCV Objective   Blood pressure 96/66, pulse 80, temperature 98.6 F (37 C), resp. rate (!) 24, height 5' 1 (1.549 m), weight 94 kg, SpO2 97%.    Vent Mode: PRVC FiO2 (%):  [45 %-100 %] 50 % Set Rate:  [24 bmp] 24 bmp Vt Set:  [330 mL-400 mL] 330 mL PEEP:  [5 cmH20-10 cmH20] 10 cmH20 Plateau Pressure:  [10 cmH20-28 cmH20] 10 cmH20   Intake/Output Summary (Last 24 hours) at 08/30/2023 1559 Last data filed at 08/30/2023 1400 Gross per 24 hour  Intake 1731.09 ml  Output 1470 ml  Net 261.09 ml   Filed Weights   08/29/23 1000  Weight: 94 kg    Examination: General this is a otherwise healthy appearing 43 year old female. She is sedated on vent but in no distress HENT NCAT no JVD MMM orally intubated Pulm diffuse wheeze and rhonchi. Very asynchronous in PRVC, on PSV volumes in 500 on 12/8 but had prolonged exp phase and sig WOB so changed to PCV. Appears more comfortable. PCXR today: on-going diffuse bilateral airspace disease Card RRR  Abd soft tol  TF Gu cl yellow  Neuro opens eyes follows commands does get anxious and RR rises  Ext no edema   Resolved Hospital Problem list   Lactic acidosis   Assessment & Plan:   Acute respiratory failure with hypoxia 2/2 Rhinovirus viral PNA and CAP (NOS) w/ ALI  and complicated by on-going bronchospasm seemingly more PNA than edema given limited response to lasix   Plan Continue full ventilator support, changed to PCV for vent synchrony. Ve and WOB better.  Will repeat ABG and adjust as indicated  Wean PEEP/FiO2 for saturations greater than 92% Follow-up respiratory culture Follow-up urine Legionella  Continue current Coverage currently on azithromycin  and ceftriaxone  day 3 Day #3 systemic steroids, change to 40 mg BID w/ ongoing bronchospasm PAD protocol RASS goal: 0 to -1, ck lipase and triglyceride in am w/ higher dose prop A.m. chest x-ray  NSTEMI Trops down trending Plan: Cont IV heparin  (complete 48 hrs) VT asa and statin Metoprolol  25 mg VT BID Will likely need ischemia eval Cards following  Metabolic acidosis / NAGMA & also mod lactic acidosis  and rising cr  Acid base initially improving, then worse w/ rising scr. Improved w/ IVFs Plan Repeat lactate for clearance   Hx of epilepsy: secondary to prior left temporal lobe encephalocele s/p resection at [facility] 07/2022 Plan: Continue briviact , zonisamide , and trileptal   Chronic hyponatremia: baseline around 124, I suspect this is secondary to some of her antilipid medications, most recently sodium 134,  think she is euvolemic at this point Plan: Cont to monitor  Dec decrease IVF rate   Glycemic control Plan Ssi goal 140-180  Tobacco abuse Plan: cessation counseling when appropriate   Anxiety/depression Plan: Cont  buspar  and nortriptyline  VT  Hx of migraines Plan: Hold home emgality  and ubrelvy    Best Practice (right click and Reselect all SmartList Selections daily)   Diet/type: tubefeeds DVT prophylaxis  systemic heparin  Pressure ulcer(s): N/A GI prophylaxis: H2B Lines: N/A Foley:  N/A Code Status:  full code Last date of multidisciplinary goals of care discussion [pending]  Critical care time: 32 min

## 2023-08-30 NOTE — Progress Notes (Signed)
 Patient Respiratory rate in 40's she is coughing, and sound congested. Suctioned and airway is dry. Patient memicks like something in in her  throat. When RN ask patient if she felt like something was in her stuck in there airway she nodded her head up and down  Yes Respiratory notified to come to [place]. Patient BP softer side, 50mcg of fentanyl  given.

## 2023-08-30 NOTE — Progress Notes (Signed)
 Respiratory lavage patient and patient reports feeling better. Respiratory rate improve, culture sent from the retrieved sputum. Patient respiratory rate now 24.Peep reduce to 8 by RT per order.

## 2023-08-30 NOTE — Progress Notes (Signed)
 Notified Respiratory patient neb Yupelri  that was due at had not been given and patient with inspiratory and expiratory wheezes throughout. Patient with belly tug breathing.  Ask for PRN breathing treatment as well. Notified Elink that patient with labor breathing inspite of PRN fentanyl . Patient propofol  at max dose ordered. Patient breathing over the vent and doesn't have a synchronous wave form. Patient respiration rate was in 30-40 until RN raised bed to 50 degree angle. Patient respiration pattern immediatly decrease to 23-30 BPM. Patient also ordered 25mg  of metoprolol  and blood pressure a little on the softer side. Elink notified

## 2023-08-30 NOTE — Progress Notes (Addendum)
 NAME:  MAEBRY OBRIEN, MRN:  [id number], DOB:  Dec 20, 1980, LOS: 1 ADMISSION DATE:  08/29/2023, CONSULTATION DATE:  2/8 REFERRING MD:  Dr. Manfred, CHIEF COMPLAINT:  respiratory failure   History of Present Illness:  A 43 year old female with pertinent PMH seizures secondary to prior left temporal lobe encephalocele s/p resection at [facility] 07/2022, chronic hyponatremia, tobacco abuse (0.5 ppd daily), smokes marijuana, and depression presents to [facility] ED on 2/7 with SOB, CP, chills, fatigue, and dry cough for 2-3 days without f/c/r, N/V/D, abd pain, rash, wheezing, or recent traveling. No alcohol or illicit drug use. Patient began having some pleuritic chest pain and came to [facility] ED on 2/7 for further eval.  On arrival patient was sinus tachycardia 110s and tachypneic with RR in 30-40s.  Initial temp 99 F.  Patient sats improved on [place].  CXR with cardiomegaly and diffuse pulmonary  opacity likely pulmonary edema less likely pneumonia.  Given Lasix .  COVID, flu, RSV negative.   D-dimer 1.99, BNP 171, troponin 225 then 210.  EKG with no ST elevation; anterolateral T wave inversions.  CTA chest with no PE; diffuse groundglass densities concerning for pneumonia.  WBC 14.1 and PCT 0.44.  Cultures obtained and placed on Rocephin  and azithromycin  for possible CAP.  Cardiology consulted.  Stat echo showing LVEF 65 to 70%; difficult to assess wall motion abnormality; mild diastolic dysfunction.  Patient started on heparin  IV for NSTEMI.  Later in evening patient with worsening respiratory distress and placed on BiPAP.  ABG 7.4, 28, 140, 17. Given albuterol neb and started on IV steroids.  Given repeat Lasix .  Patient still tachypneic with RR in 50s. Patient intubated and placed on vent.  PCCM consulted for ICU transfer to [facility].  Pertinent  Medical History   Past Medical History:  Diagnosis Date   Back pain    Chronic headaches    Depression    Fall 03/07/2021   Genital warts    herpes   Herpes    Lip injury  01/25/2021   Lower lip trauma on 01/19/2021 during seizure, treated at [facility] urgent care   Seizures [facility])    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 ([facility])   Vision loss, bilateral 05/08/2021     Significant Hospital Events: Including procedures, antibiotic start and stop dates in addition to other pertinent events   2/7 admitted to [facility] with chest pain and SOB Intubated  2/8 now at [facility].  Continuing antibiotics, IV diuresis, systemic steroids, following up on cultures remains on heparin   Interim History / Subjective:  sedated on ventilator Objective   Blood pressure 113/77, pulse 87, temperature 99.1 F (37.3 C), resp. rate (!) 24, height 5' 1 (1.549 m), weight 94 kg, SpO2 100%.    Vent Mode: PRVC FiO2 (%):  [45 %-100 %] 70 % Set Rate:  [24 bmp] 24 bmp Vt Set:  [400 mL] 400 mL PEEP:  [5 cmH20-10 cmH20] 10 cmH20 Plateau Pressure:  [25 cmH20-28 cmH20] 25 cmH20   Intake/Output Summary (Last 24 hours) at 08/30/2023 0735 Last data filed at 08/30/2023 0400 Gross per 24 hour  Intake 709.38 ml  Output 850 ml  Net -140.62 ml   Filed Weights   08/29/23 1000  Weight: 94 kg    Examination: General 43 year old female patient currently sedated on propofol  infusion HEENT normocephalic atraumatic no clear jugular venous distention orally intubated Pulmonary: Coarse scattered rhonchi with wheezing and basilar rales.  Currently Plateau pressure  20, driving pressure 10 PCXR: ett good position bilateral airspace disease looks a little worse  Cardiac regular rate and rhythm Abdomen soft not tender Extremities are warm and dry GU clear Neuro currently sedated, however on less sedation will open eyes, moves all extremities, follows commands, nods appropriately.  Resolved Hospital Problem list   Lactic acidosis   Assessment & Plan:   Acute respiratory failure with hypoxia 2/2 CAP c/b pulmonary edema  ?inflammatory pneumonitis  due to smoking marijuana but seems less likely.  Urine strep negative Plan Continue full ventilator support, changed to 7 cc/kg predicted body weight and adjust minute ventilation appropriately, goal plateau pressure less than 30 with driving pressure less than 15 No change in current minute ventilation as long as pH remains greater than 7.2 Wean PEEP/FiO2 for saturations greater than 88% Follow-up respiratory culture Follow-up urine Legionella and respiratory viral panel Continue current Coverage currently on azithromycin  and ceftriaxone  day 2 Day #2 systemic steroids, can change to 40 mg daily with plan to complete 7 days then slow taper in the setting of severe PAD protocol RASS goal:-2 A.m. chest x-ray  NSTEMI Trops down trending Plan: Cont IV heparin   VT asa and statin Metoprolol  25 mg VT BID Will make sure cards following  Metabolic acidosis / NAGMA Plan Supportive care Repeating a.m. chemistry Checking lactic acid, really just to ensure its cleared  Hx of epilepsy: secondary to prior left temporal lobe encephalocele s/p resection at [facility] 07/2022 Plan: Continue briviact , zonisamide , and trileptal   Chronic hyponatremia: baseline around 123, I suspect this is secondary to some of her antilipid medications, most recently sodium 126 Plan: Getting another dose of diuresis today  monitor   Glycemic control Plan Ssi goal 140-180  Tobacco abuse Plan: cessation counseling when appropriate   Anxiety/depression Plan: Cont  buspar  and nortriptyline  VT  Hx of migraines Plan: Hold home emgality  and ubrelvy    Best Practice (right click and Reselect all SmartList Selections daily)   Diet/type: tubefeeds DVT prophylaxis systemic heparin  Pressure ulcer(s): N/A GI prophylaxis: H2B Lines: N/A Foley:  N/A Code Status:  full code Last date of multidisciplinary goals of care discussion [pending]  Critical care time: 34 min

## 2023-08-30 NOTE — Progress Notes (Signed)
 Afternoon chemistry reviewed Bicarb slowly improving. Did have cr bump but treated earlier w/ fluid challenge Plan Cont rx as outlined will hold off on additional IVFs. Keep euvolemic

## 2023-08-30 NOTE — ED Notes (Signed)
 ED TO INPATIENT HANDOFF REPORT  ED Nurse Name and Phone #: [facility] [phone]  S Name/Age/Gender [name] 43 y.o. female Room/Bed: APA02/APA02  Code Status   Code Status: Full Code  Home/SNF/Other Home Patient oriented to: self, place, time, and situation Is this baseline? Yes   Triage Complete: Triage complete  Chief Complaint Elevated troponin [R79.89] Acute respiratory failure with hypoxia (HCC) [J96.01]  Triage Note Pt reports 2-3 days ago cough started, Non productive. SOB started 3 days ago with weakness and chills. Pt has low grade fever. Ems reports 92% on 6L. Pt is visibly and audibly labored.Pt reports chills and hot flashes. Pt repots taking robitussin.    Allergies Allergies  Allergen Reactions   Morphine Other (See Comments)    Unknown Reaction      Level of Care/Admitting Diagnosis ED Disposition     ED Disposition  Admit   Condition  --   Comment  Hospital Area: [facility] [[id number]]  Level of Care: ICU [6]  May admit patient to [facility] or [facility] if equivalent level of care is available:: Yes  Covid Evaluation: Asymptomatic - no recent exposure (last 10 days) testing not required  Diagnosis: Acute respiratory failure with hypoxia [facility]) [[id number]]  Admitting Physician: ADEFESO, OLADAPO [[id number]]  Attending Physician: ADEFESO, OLADAPO [[id number]]  Certification:: I certify this patient will need inpatient services for at least 2 midnights  Expected Medical Readiness: 09/01/2023          B Medical/Surgery History Past Medical History:  Diagnosis Date   Back pain    Chronic headaches    Depression    Fall 03/07/2021   Genital warts    herpes   Herpes    Lip injury 01/25/2021   Lower lip trauma on 01/19/2021 during seizure, treated at [facility] urgent care   Seizures [facility])    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 ([facility])   Vision loss, bilateral 05/08/2021    Past Surgical History:  Procedure Laterality Date   BRAIN SURGERY  07/30/2022   CESAREAN SECTION     x 3     A IV Location/Drains/Wounds Patient Lines/Drains/Airways Status     Active Line/Drains/Airways     Name Placement date Placement time Site Days   Peripheral IV 08/29/23 Left Antecubital 08/29/23  1033  Antecubital  1   Peripheral IV 08/29/23 20 G Right Antecubital 08/29/23  1910  Antecubital  1   NG/OG Vented/Dual Lumen Oral External length of tube 64 cm 08/30/23  0003  Oral  less than 1   Urethral Catheter J Foster NT3 Temperature probe 14 Fr. 08/30/23  0009  Temperature probe  less than 1   Airway 7.5 mm 08/29/23  2355  -- 1            Intake/Output Last 24 hours  Intake/Output Summary (Last 24 hours) at 08/30/2023 0015 Last data filed at 08/29/2023 1900 Gross per 24 hour  Intake --  Output 200 ml  Net -200 ml    Labs/Imaging Results for orders placed or performed during the hospital encounter of 08/29/23 (from the past 48 hours)  CBC with Differential     Status: Abnormal   Collection Time: 08/29/23 10:00 AM  Result Value Ref Range   WBC 14.1 (H) 4.0 - 10.5 K/uL   RBC 4.00 3.87 - 5.11 MIL/uL   Hemoglobin 12.5 12.0 - 15.0 g/dL   HCT 63.3 63.9 - 53.9 %  MCV 91.5 80.0 - 100.0 fL   MCH 31.3 26.0 - 34.0 pg   MCHC 34.2 30.0 - 36.0 g/dL   RDW 84.9 88.4 - 84.4 %   Platelets 410 (H) 150 - 400 K/uL   nRBC 0.0 0.0 - 0.2 %   Neutrophils Relative % 93 %   Neutro Abs 13.1 (H) 1.7 - 7.7 K/uL   Lymphocytes Relative 4 %   Lymphs Abs 0.6 (L) 0.7 - 4.0 K/uL   Monocytes Relative 2 %   Monocytes Absolute 0.3 0.1 - 1.0 K/uL   Eosinophils Relative 0 %   Eosinophils Absolute 0.0 0.0 - 0.5 K/uL   Basophils Relative 0 %   Basophils Absolute 0.0 0.0 - 0.1 K/uL   Immature Granulocytes 1 %   Abs Immature Granulocytes 0.10 (H) 0.00 - 0.07 K/uL    Comment: Performed at [facility], [address], [place], [place] [address]  Comprehensive metabolic panel     Status:  Abnormal   Collection Time: 08/29/23 10:00 AM  Result Value Ref Range   Sodium 124 (L) 135 - 145 mmol/L   Potassium 4.1 3.5 - 5.1 mmol/L   Chloride 94 (L) 98 - 111 mmol/L   CO2 16 (L) 22 - 32 mmol/L   Glucose, Bld 149 (H) 70 - 99 mg/dL    Comment: Glucose reference range applies only to samples taken after fasting for at least 8 hours.   BUN <5 (L) 6 - 20 mg/dL   Creatinine, Ser 9.28 0.44 - 1.00 mg/dL   Calcium  8.9 8.9 - 10.3 mg/dL   Total Protein 8.1 6.5 - 8.1 g/dL   Albumin 3.2 (L) 3.5 - 5.0 g/dL   AST 30 15 - 41 U/L   ALT 11 0 - 44 U/L   Alkaline Phosphatase 140 (H) 38 - 126 U/L   Total Bilirubin 0.6 0.0 - 1.2 mg/dL   GFR, Estimated >39 >39 mL/min    Comment: (NOTE) Calculated using the CKD-EPI Creatinine Equation (2021)    Anion gap 14 5 - 15    Comment: Performed at [facility], [address] [place], [place] [address]  Resp panel by RT-PCR (RSV, Flu A&B, Covid) Anterior Nasal Swab     Status: None   Collection Time: 08/29/23 10:13 AM   Specimen: Anterior Nasal Swab  Result Value Ref Range   SARS Coronavirus 2 by RT PCR NEGATIVE NEGATIVE    Comment: (NOTE) SARS-CoV-2 target nucleic acids are NOT DETECTED.  The SARS-CoV-2 RNA is generally detectable in upper respiratory specimens during the acute phase of infection. The lowest concentration of SARS-CoV-2 viral copies this assay can detect is 138 copies/mL. A negative result does not preclude SARS-Cov-2 infection and should not be used as the sole basis for treatment or other patient management decisions. A negative result may occur with  improper specimen collection/handling, submission of specimen other than nasopharyngeal swab, presence of viral mutation(s) within the areas targeted by this assay, and inadequate number of viral copies(<138 copies/mL). A negative result must be combined with clinical observations, patient history, and epidemiological information. The expected result is Negative.  Fact Sheet for  Patients:  bloggercourse.com  Fact Sheet for Healthcare Providers:  seriousbroker.it  This test is no t yet approved or cleared by the [place]  FDA and  has been authorized for detection and/or diagnosis of SARS-CoV-2 by FDA under an Emergency Use Authorization (EUA). This EUA will remain  in effect (meaning this test can be used) for the duration of  the COVID-19 declaration under Section 564(b)(1) of the Act, 21 U.S.C.section 360bbb-3(b)(1), unless the authorization is terminated  or revoked sooner.       Influenza A by PCR NEGATIVE NEGATIVE   Influenza B by PCR NEGATIVE NEGATIVE    Comment: (NOTE) The Xpert Xpress SARS-CoV-2/FLU/RSV plus assay is intended as an aid in the diagnosis of influenza from Nasopharyngeal swab specimens and should not be used as a sole basis for treatment. Nasal washings and aspirates are unacceptable for Xpert Xpress SARS-CoV-2/FLU/RSV testing.  Fact Sheet for Patients: bloggercourse.com  Fact Sheet for Healthcare Providers: seriousbroker.it  This test is not yet approved or cleared by the [place]  FDA and has been authorized for detection and/or diagnosis of SARS-CoV-2 by FDA under an Emergency Use Authorization (EUA). This EUA will remain in effect (meaning this test can be used) for the duration of the COVID-19 declaration under Section 564(b)(1) of the Act, 21 U.S.C. section 360bbb-3(b)(1), unless the authorization is terminated or revoked.     Resp Syncytial Virus by PCR NEGATIVE NEGATIVE    Comment: (NOTE) Fact Sheet for Patients: bloggercourse.com  Fact Sheet for Healthcare Providers: seriousbroker.it  This test is not yet approved or cleared by the [place]  FDA and has been authorized for detection and/or diagnosis of SARS-CoV-2 by FDA under an Emergency Use Authorization  (EUA). This EUA will remain in effect (meaning this test can be used) for the duration of the COVID-19 declaration under Section 564(b)(1) of the Act, 21 U.S.C. section 360bbb-3(b)(1), unless the authorization is terminated or revoked.  Performed at [facility], [address] [place], [place] [address]   D-dimer, quantitative     Status: Abnormal   Collection Time: 08/29/23 10:25 AM  Result Value Ref Range   D-Dimer, Quant 1.99 (H) 0.00 - 0.50 ug/mL-FEU    Comment: (NOTE) At the manufacturer cut-off value of 0.5 g/mL FEU, this assay has a negative predictive value of 95-100%.This assay is intended for use in conjunction with a clinical pretest probability (PTP) assessment model to exclude pulmonary embolism (PE) and deep venous thrombosis (DVT) in outpatients suspected of PE or DVT. Results should be correlated with clinical presentation. Performed at [facility], [address] [place], [place] [address]   Brain natriuretic peptide     Status: Abnormal   Collection Time: 08/29/23 10:37 AM  Result Value Ref Range   B Natriuretic Peptide 171.0 (H) 0.0 - 100.0 pg/mL    Comment: Performed at [facility], [address], [place], [place] [address]  Troponin I (High Sensitivity)     Status: Abnormal   Collection Time: 08/29/23 10:37 AM  Result Value Ref Range   Troponin I (High Sensitivity) 225 (HH) <18 ng/L    Comment: CRITICAL RESULT CALLED TO, READ BACK BY AND VERIFIED WITH DANNER,A  AT 11:55AM ON 08/29/23 BY FESTERMAN,C (NOTE) Elevated high sensitivity troponin I (hsTnI) values and significant  changes across serial measurements may suggest ACS but many other  chronic and acute conditions are known to elevate hsTnI results.  Refer to the Links section for chest pain algorithms and additional  guidance. Performed at [facility], [address], [place], [place] [address]   Troponin I (High Sensitivity)     Status: Abnormal   Collection Time: 08/29/23 12:58 PM  Result  Value Ref Range   Troponin I (High Sensitivity) 210 (HH) <18 ng/L    Comment: CRITICAL VALUE NOTED. VALUE IS CONSISTENT WITH PREVIOUSLY REPORTED/CALLED VALUE (NOTE) Elevated high sensitivity troponin I (hsTnI)  values and significant  changes across serial measurements may suggest ACS but many other  chronic and acute conditions are known to elevate hsTnI results.  Refer to the Links section for chest pain algorithms and additional  guidance. Performed at [facility], [address], [place], [place] [address]   Sedimentation rate     Status: Abnormal   Collection Time: 08/29/23  2:07 PM  Result Value Ref Range   Sed Rate 93 (H) 0 - 22 mm/hr    Comment: Performed at [facility], [address], [place], [place] [address]  Heparin  level (unfractionated)     Status: Abnormal   Collection Time: 08/29/23  6:12 PM  Result Value Ref Range   Heparin  Unfractionated <0.10 (L) 0.30 - 0.70 IU/mL    Comment: (NOTE) The clinical reportable range upper limit is being lowered to >1.10 to align with the FDA approved guidance for the current laboratory assay.  If heparin  results are below expected values, and patient dosage has  been confirmed, suggest follow up testing of antithrombin III levels. Performed at [facility], [address], [place], [place] [address]   Lactic acid, plasma     Status: None   Collection Time: 08/29/23  6:43 PM  Result Value Ref Range   Lactic Acid, Venous 1.8 0.5 - 1.9 mmol/L    Comment: Performed at [facility], [address], [place], [place] [address]  Procalcitonin     Status: None   Collection Time: 08/29/23  6:43 PM  Result Value Ref Range   Procalcitonin 0.44 ng/mL    Comment:        Interpretation: PCT (Procalcitonin) <= 0.5 ng/mL: Systemic infection (sepsis) is not likely. Local bacterial infection is possible. (NOTE)       Sepsis PCT Algorithm           Lower Respiratory Tract                                      Infection PCT Algorithm     ----------------------------     ----------------------------         PCT < 0.25 ng/mL                PCT < 0.10 ng/mL          Strongly encourage             Strongly discourage   discontinuation of antibiotics    initiation of antibiotics    ----------------------------     -----------------------------       PCT 0.25 - 0.50 ng/mL            PCT 0.10 - 0.25 ng/mL               OR       >80% decrease in PCT            Discourage initiation of                                            antibiotics      Encourage discontinuation           of antibiotics    ----------------------------     -----------------------------         PCT >= 0.50 ng/mL  PCT 0.26 - 0.50 ng/mL               AND        <80% decrease in PCT             Encourage initiation of                                             antibiotics       Encourage continuation           of antibiotics    ----------------------------     -----------------------------        PCT >= 0.50 ng/mL                  PCT > 0.50 ng/mL               AND         increase in PCT                  Strongly encourage                                      initiation of antibiotics    Strongly encourage escalation           of antibiotics                                     -----------------------------                                           PCT <= 0.25 ng/mL                                                 OR                                        > 80% decrease in PCT                                      Discontinue / Do not initiate                                             antibiotics  Performed at [facility], [address], [place], [place] [address]   Blood gas, arterial     Status: Abnormal   Collection Time: 08/29/23  8:02 PM  Result Value Ref Range   pH, Arterial 7.4 7.35 - 7.45   pCO2 arterial 28 (L) 32 - 48 mmHg   pO2, Arterial 140 (H) 83 - 108 mmHg   Bicarbonate 17.3 (L) 20.0 - 28.0 mmol/L   Acid-base deficit  6.1 (H) 0.0 -  2.0 mmol/L   O2 Saturation 99.7 %   Patient temperature 37.0    Collection site LEFT RADIAL    Drawn by [id number]    Allens test (pass/fail) PASS PASS    Comment: Performed at [facility], [address], [place], [place] [address]  Lactic acid, plasma     Status: Abnormal   Collection Time: 08/29/23  8:09 PM  Result Value Ref Range   Lactic Acid, Venous 2.1 (HH) 0.5 - 1.9 mmol/L    Comment: CRITICAL RESULT CALLED TO, READ BACK BY AND VERIFIED WITH PRUITT,G @ 2112 ON 08/29/23 BY JUW Performed at [facility], [address], [place], [place] [address]   Basic metabolic panel     Status: Abnormal   Collection Time: 08/29/23  8:09 PM  Result Value Ref Range   Sodium 126 (L) 135 - 145 mmol/L   Potassium 4.5 3.5 - 5.1 mmol/L   Chloride 96 (L) 98 - 111 mmol/L   CO2 18 (L) 22 - 32 mmol/L   Glucose, Bld 115 (H) 70 - 99 mg/dL    Comment: Glucose reference range applies only to samples taken after fasting for at least 8 hours.   BUN 5 (L) 6 - 20 mg/dL   Creatinine, Ser 9.24 0.44 - 1.00 mg/dL   Calcium  8.5 (L) 8.9 - 10.3 mg/dL   GFR, Estimated >39 >39 mL/min    Comment: (NOTE) Calculated using the CKD-EPI Creatinine Equation (2021)    Anion gap 12 5 - 15    Comment: Performed at [facility], [address] [place], [place] [address]  Heparin  level (unfractionated)     Status: Abnormal   Collection Time: 08/29/23 11:21 PM  Result Value Ref Range   Heparin  Unfractionated 0.24 (L) 0.30 - 0.70 IU/mL    Comment: (NOTE) The clinical reportable range upper limit is being lowered to >1.10 to align with the FDA approved guidance for the current laboratory assay.  If heparin  results are below expected values, and patient dosage has  been confirmed, suggest follow up testing of antithrombin III levels. Performed at [facility], [address] [place], [place] [address]    DG CHEST PORT 1 VIEW Result Date: 08/29/2023 CLINICAL DATA:  Acute respiratory failure with hypoxia EXAM:  PORTABLE CHEST 1 VIEW COMPARISON:  08/29/2023 FINDINGS: Cardiomegaly, vascular congestion. Bilateral interstitial and airspace opacities are similar prior study, favor edema. No visible effusions. No acute bony abnormality. IMPRESSION: Cardiomegaly.  Stable pulmonary edema pattern. Electronically Signed   By: Franky Crease M.D.   On: 08/29/2023 21:24   CT Angio Chest Pulmonary Embolism (PE) W or WO Contrast Result Date: 08/29/2023 CLINICAL DATA:  Cough and shortness of breath.  Low-grade fever. EXAM: CT ANGIOGRAPHY CHEST WITH CONTRAST TECHNIQUE: Multidetector CT imaging of the chest was performed using the standard protocol during bolus administration of intravenous contrast. Multiplanar CT image reconstructions and MIPs were obtained to evaluate the vascular anatomy. RADIATION DOSE REDUCTION: This exam was performed according to the departmental dose-optimization program which includes automated exposure control, adjustment of the mA and/or kV according to patient size and/or use of iterative reconstruction technique. CONTRAST:  75mL OMNIPAQUE  IOHEXOL  350 MG/ML SOLN COMPARISON:  Chest x-ray from same day. FINDINGS: Cardiovascular: Satisfactory opacification of the pulmonary arteries to the segmental level. No evidence of pulmonary embolism. Normal heart size. No pericardial effusion. No thoracic aortic aneurysm or dissection. Mediastinum/Nodes: Prominent mediastinal lymph nodes measuring up to 1 cm in short axis are likely reactive. No enlarged axillary lymph  nodes. Thyroid  gland, trachea, and esophagus demonstrate no significant findings. Lungs/Pleura: Diffuse ground-glass densities throughout both lungs. Scattered smooth interlobular septal thickening. No pleural effusion or pneumothorax. Upper Abdomen: No acute abnormality. Musculoskeletal: No chest wall abnormality. No acute or significant osseous findings. Review of the MIP images confirms the above findings. IMPRESSION: 1. No evidence of pulmonary embolism.  2. Diffuse ground-glass densities throughout both lungs, concerning for pneumonia. Electronically Signed   By: Elsie ONEIDA Shoulder M.D.   On: 08/29/2023 17:06   ECHOCARDIOGRAM COMPLETE Result Date: 08/29/2023    ECHOCARDIOGRAM REPORT   Patient Name:   [name] Date of Exam: 08/29/2023 Medical Rec #:  [id number]        Height:       61.0 in Accession #:    [id number]       Weight:       207.2 lb Date of Birth:  Feb 20, 1981        BSA:          1.917 m Patient Age:    42 years         BP:           146/93 mmHg Patient Gender: F                HR:           113 bpm. Exam Location:  [facility] Procedure: 2D Echo, Cardiac Doppler and Color Doppler Indications:    CHF-Acute Systolic I50.21 / Increased Troponins  History:        Patient has no prior history of Echocardiogram examinations.                 Risk Factors:Current Smoker.  Sonographer:    Aida Pizza RCS Referring Phys: [phone] JOSEPH ZAMMIT IMPRESSIONS  1. Left ventricular ejection fraction, by estimation, is 65 to 70%. The left ventricle has normal function. Left ventricular endocardial border not optimally defined to evaluate regional wall motion - cannot exclude basal posterolateral hypokinesis. There is mild asymmetric left ventricular hypertrophy of the septal segment. Left ventricular diastolic parameters are consistent with Grade I diastolic dysfunction (impaired relaxation).  2. Right ventricular systolic function is normal. The right ventricular size is normal. Tricuspid regurgitation signal is inadequate for assessing PA pressure.  3. The mitral valve is grossly normal. Trivial mitral valve regurgitation.  4. The aortic valve is tricuspid. Aortic valve regurgitation is not visualized. No aortic stenosis is present.  5. The inferior vena cava is normal in size with greater than 50% respiratory variability, suggesting right atrial pressure of 3 mmHg. Comparison(s): No prior Echocardiogram. FINDINGS  Left Ventricle: Left ventricular ejection fraction,  by estimation, is 65 to 70%. The left ventricle has normal function. Left ventricular endocardial border not optimally defined to evaluate regional wall motion. The left ventricular internal cavity size was normal in size. There is mild asymmetric left ventricular hypertrophy of the septal segment. Left ventricular diastolic parameters are consistent with Grade I diastolic dysfunction (impaired relaxation). Right Ventricle: The right ventricular size is normal. No increase in right ventricular wall thickness. Right ventricular systolic function is normal. Tricuspid regurgitation signal is inadequate for assessing PA pressure. Left Atrium: Left atrial size was normal in size. Right Atrium: Right atrial size was normal in size. Pericardium: Trivial pericardial effusion is present. The pericardial effusion is surrounding the apex. Presence of epicardial fat layer. Mitral Valve: The mitral valve is grossly normal. Trivial mitral valve regurgitation. Tricuspid Valve: The tricuspid valve is grossly normal.  Tricuspid valve regurgitation is trivial. Aortic Valve: The aortic valve is tricuspid. There is mild aortic valve annular calcification. Aortic valve regurgitation is not visualized. No aortic stenosis is present. Pulmonic Valve: The pulmonic valve was grossly normal. Pulmonic valve regurgitation is trivial. Aorta: The aortic root is normal in size and structure. Venous: The inferior vena cava is normal in size with greater than 50% respiratory variability, suggesting right atrial pressure of 3 mmHg. IAS/Shunts: No atrial level shunt detected by color flow Doppler.  LEFT VENTRICLE PLAX 2D LVIDd:         4.30 cm   Diastology LVIDs:         3.20 cm   LV e' medial:    5.66 cm/s LV PW:         1.00 cm   LV E/e' medial:  11.4 LV IVS:        1.20 cm   LV e' lateral:   11.30 cm/s LVOT diam:     2.00 cm   LV E/e' lateral: 5.7 LV SV:         53 LV SV Index:   28 LVOT Area:     3.14 cm  RIGHT VENTRICLE RV S prime:     18.20 cm/s  TAPSE (M-mode): 1.7 cm LEFT ATRIUM             Index        RIGHT ATRIUM           Index LA diam:        3.20 cm 1.67 cm/m   RA Area:     14.60 cm LA Vol (A2C):   39.9 ml 20.81 ml/m  RA Volume:   41.50 ml  21.64 ml/m LA Vol (A4C):   24.5 ml 12.78 ml/m LA Biplane Vol: 32.4 ml 16.90 ml/m  AORTIC VALVE LVOT Vmax:   119.00 cm/s LVOT Vmean:  70.500 cm/s LVOT VTI:    0.168 m  AORTA Ao Root diam: 3.40 cm MITRAL VALVE MV Area (PHT): 1.53 cm    SHUNTS MV Decel Time: 496 msec    Systemic VTI:  0.17 m MV E velocity: 64.80 cm/s  Systemic Diam: 2.00 cm MV A velocity: 83.60 cm/s MV E/A ratio:  0.78 Jayson Sierras MD Electronically signed by Jayson Sierras MD Signature Date/Time: 08/29/2023/1:33:58 PM    Final    DG Chest Port 1 View Result Date: 08/29/2023 CLINICAL DATA:  43 year old female with shortness of breath, nonproductive cough for 2-3 days. EXAM: PORTABLE CHEST 1 VIEW COMPARISON:  Chest radiographs 12/26/2014. FINDINGS: Portable AP semi upright view at 1022 hours. Lordotic positioning. Cardiomegaly is new since 2016. Lower lung volumes. Diffuse bilateral pulmonary interstitial opacity, basilar and peripheral predominant and fairly symmetric. Trace fluid in the right minor fissure. No other pleural effusions are evident. No pneumothorax or air bronchograms. No acute osseous abnormality identified. Negative visible bowel gas pattern. IMPRESSION: Cardiomegaly is new since 2016 with diffuse pulmonary interstitial opacity favored to be acute pulmonary edema. Trace pleural fluid in the right minor fissure. Bilateral pneumonia felt less likely. Electronically Signed   By: VEAR Hurst M.D.   On: 08/29/2023 11:01    Pending Labs Unresulted Labs (From admission, onward)     Start     Ordered   08/30/23 0500  CBC  Daily,   R      08/29/23 1232   08/30/23 0500  Magnesium  Tomorrow morning,   R        08/29/23 1753  08/30/23 0500  Basic metabolic panel  Tomorrow morning,   R        08/29/23 1753   08/30/23 0500   Triglycerides  (propofol  (DIPRIVAN ))  Every 72 hours,   R     Comments: While on propofol  (DIPRIVAN )    08/29/23 2341   08/30/23 0015  Blood gas, arterial (at [facility] & [facility])  Once,   R        08/30/23 0014   08/29/23 2334  C-reactive protein  Add-on,   AD        08/29/23 2333   08/29/23 2300  Lactic acid, plasma  (Lactic Acid)  ONCE - STAT,   STAT        08/29/23 2112   08/29/23 1752  HIV Antibody (routine testing w rflx)  (HIV Antibody (Routine testing w reflex) panel)  Once,   R        08/29/23 1753   08/29/23 1735  Culture, blood (Routine X 2) w Reflex to ID Panel  BLOOD CULTURE X 2,   R      08/29/23 1735   08/29/23 1411  Rapid urine drug screen (hospital performed)  ONCE - STAT,   STAT        08/29/23 1410   08/29/23 1346  High sensitivity CRP  Once,   URGENT        08/29/23 1345            Vitals/Pain Today's Vitals   08/29/23 2358 08/30/23 0000 08/30/23 0010 08/30/23 0013  BP:  (!) 111/93 (!) 129/96   Pulse:  93  99  Resp:  15 (!) 24 (!) 24  Temp:   100.3 F (37.9 C) 100.3 F (37.9 C)  TempSrc:      SpO2:  97%  100%  Weight:      Height: 5' 1 (1.549 m)     PainSc:        Isolation Precautions No active isolations  Medications Medications  heparin  ADULT infusion 100 units/mL (25000 units/250mL) (1,100 Units/hr Intravenous Rate/Dose Change 08/29/23 1943)  metoprolol  tartrate (LOPRESSOR ) tablet 25 mg (25 mg Oral Given 08/29/23 2115)  aspirin  EC tablet 81 mg (81 mg Oral Given 08/29/23 1508)  LORazepam  (ATIVAN ) injection 1 mg (1 mg Intravenous Given 08/29/23 2104)  cefTRIAXone  (ROCEPHIN ) 2 g in sodium chloride  0.9 % 100 mL IVPB (0 g Intravenous Stopped 08/29/23 2031)  azithromycin  (ZITHROMAX ) 500 mg in sodium chloride  0.9 % 250 mL IVPB (0 mg Intravenous Stopped 08/29/23 2134)  busPIRone  (BUSPAR ) tablet 5 mg (5 mg Oral Given 08/29/23 2115)  nortriptyline  (PAMELOR ) capsule 25 mg (25 mg Oral Not Given 08/29/23 2254)  brivaracetam  (BRIVIACT ) tablet 100 mg (100 mg Oral Not Given 08/29/23  2254)  Oxcarbazepine  (TRILEPTAL ) tablet 600 mg (600 mg Oral Not Given 08/29/23 2254)  acetaminophen  (TYLENOL ) tablet 650 mg (650 mg Oral Given 08/29/23 2104)    Or  acetaminophen  (TYLENOL ) suppository 650 mg ( Rectal See Alternative 08/29/23 2104)  ondansetron  (ZOFRAN ) tablet 4 mg (has no administration in time range)    Or  ondansetron  (ZOFRAN ) injection 4 mg (has no administration in time range)  levalbuterol  (XOPENEX ) nebulizer solution 0.63 mg (0.63 mg Nebulization Given 08/29/23 2118)  methylPREDNISolone  sodium succinate (SOLU-MEDROL ) 125 mg/2 mL injection 125 mg (125 mg Intravenous Given 08/29/23 2116)    Followed by  methylPREDNISolone  sodium succinate (SOLU-MEDROL ) 125 mg/2 mL injection 60 mg (has no administration in time range)  fentaNYL  (SUBLIMAZE ) injection 50 mcg (has no administration in  time range)  fentaNYL  (SUBLIMAZE ) injection 50-200 mcg (has no administration in time range)  propofol  (DIPRIVAN ) 1000 MG/100ML infusion (5 mcg/kg/min  94 kg Intravenous New Bag/Given 08/29/23 2359)  furosemide  (LASIX ) injection 40 mg (40 mg Intravenous Given 08/29/23 1126)  heparin  bolus via infusion 4,000 Units (4,000 Units Intravenous Bolus from Bag 08/29/23 1258)  iohexol  (OMNIPAQUE ) 350 MG/ML injection 75 mL (75 mLs Intravenous Contrast Given 08/29/23 1546)  heparin  bolus via infusion 2,000 Units (2,000 Units Intravenous Bolus from Bag 08/29/23 1943)  ketorolac  (TORADOL ) 30 MG/ML injection 30 mg (30 mg Intravenous Given 08/29/23 2116)  furosemide  (LASIX ) injection 60 mg (60 mg Intravenous Given 08/29/23 2204)  HYDROmorphone  (DILAUDID ) injection 0.5 mg (0.5 mg Intravenous Given 08/29/23 2253)  rocuronium  (ZEMURON ) 100 MG/10ML injection (100 mg  Given 08/29/23 2348)  etomidate  (AMIDATE ) 2 MG/ML injection (20 mg  Given 08/29/23 2348)    Mobility walks     Focused Assessments Pulmonary Assessment Handoff:  Lung sounds: Bilateral Breath Sounds: Fine crackles O2 Device: NRB O2 Flow Rate (L/min): 15  L/min    R Recommendations: See Admitting Provider Note  Report given to:   Additional Notes:

## 2023-08-30 NOTE — Progress Notes (Signed)
 PHARMACY - ANTICOAGULATION CONSULT NOTE  Pharmacy Consult for heparin  Indication: chest pain/ACS  Allergies  Allergen Reactions   Morphine Other (See Comments)    Unknown Reaction      Patient Measurements: Height: 5' 1 (154.9 cm) Weight: 94 kg (207 lb 3.7 oz) IBW/kg (Calculated) : 47.8 Heparin  Dosing Weight: 70 kg  Vital Signs: Temp: 99.5 F (37.5 C) (02/08 1700) Temp Source: Bladder (02/08 1200) BP: 114/79 (02/08 1700) Pulse Rate: 90 (02/08 1700)  Labs: Recent Labs    08/29/23 1000 08/29/23 1037 08/29/23 1258 08/29/23 1812 08/29/23 2009 08/29/23 2321 08/30/23 0349 08/30/23 0400 08/30/23 0856 08/30/23 1659 08/30/23 1726  HGB 12.5  --   --   --   --   --  13.3  --  11.4*  --  10.9*  HCT 36.6  --   --   --   --   --  39.0  --  34.3*  --  32.0*  PLT 410*  --   --   --   --   --   --   --  384  --   --   HEPARINUNFRC  --   --   --    < >  --  0.24*  --   --  0.25* 0.30  --   CREATININE 0.71  --   --   --  0.75  --   --   --  0.93 1.12*  --   TROPONINIHS  --  225* 210*  --   --   --   --  156*  --   --   --    < > = values in this interval not displayed.    Estimated Creatinine Clearance: 67.1 mL/min (A) (by C-G formula based on SCr of 1.12 mg/dL (H)).   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 1.12 mL 11 Past Month   medroxyPROGESTERone  (DEPO-PROVERA ) 150 MG/ML injection ADMINISTER 1 ML(150 MG) IN THE MUSCLE EVERY 3 MONTHS 1 mL 4 Past Week   ketoconazole  (NIZORAL ) 2 % cream Apply 1 Application topically 2 (two) times daily as needed for irritation. (Patient not taking: Reported on 08/29/2023) 60 g 1 Not Taking   meloxicam  (MOBIC ) 15 MG tablet Take 1 tablet (15 mg total) by mouth daily. (Patient not taking: Reported on 08/29/2023) 14 tablet 0 Not Taking   nortriptyline  (PAMELOR ) 25 MG capsule TAKE 2 CAPSULES BY MOUTH EVERY NIGHT 180 capsule 0    oxcarbazepine  (TRILEPTAL ) 600 MG  tablet Take 1 tablet two times daily .      terbinafine  (LAMISIL ) 250 MG tablet Take 1 tablet (250 mg total) by mouth daily. 90 tablet 0    tiZANidine  (ZANAFLEX ) 4 MG tablet TAKE 1 TABLET BY MOUTH EVERY 6 HOURS AS NEEDED FOR MUSCLE SPASMS 30 tablet 10    triamcinolone  (KENALOG ) 0.025 % ointment Apply 1 Application topically 2 (two) times daily as needed (itching). 30 g 0    Ubrogepant  (UBRELVY ) 100 MG TABS Take 1 tablet as needed at onset of migraine. Do not take more than 3 a week 10 tablet 5    Vitamin D , Ergocalciferol , (DRISDOL ) 1.25 MG (50000 UNIT) CAPS capsule Take 1 capsule (50,000 Units total) by mouth every 7 (seven) days. 12 capsule 2    zonisamide  (ZONEGRAN ) 100 MG capsule TAKE ONE (1) CAPSULE BY MOUTH ONCE DAILY IN THE MORNING AND TAKE FIVE (5) CAPSULES IN THE EVENING 540 capsule 3     Assessment: Pharmacy consulted to dose heparin  in patient with chest pain/ACS.  Patient is not on anticoagulation prior to admission.  Trop 225 >> 210 >> 156. Cards consulted.  Heparin  level at low end of goal (0.3) on 1400 units/hr.  Hb 11.4, plt 384.   Goal of Therapy:  Heparin  level 0.3-0.7 units/ml Monitor  platelets by anticoagulation protocol: Yes   Plan:  Increase heparin  to 1500 units/hr Next heparin  level with AM labs.   [employer], Pharm.D., BCPS Clinical Pharmacist  **Pharmacist phone directory can be found on [email or web address] listed under [facility].  08/30/2023 6:15 PM

## 2023-08-30 NOTE — Progress Notes (Signed)
 Progress Note  Patient Name: [name] Date of Encounter: 08/30/2023  Primary Cardiologist: New to [facility]  Interval Summary   Interval chart reviewed since cardiology consultation yesterday.  Respiratory status deteriorated yesterday initially requiring BiPAP and ultimately intubation on the ventilator.  Treated with nebulizers and steroids as well as Lasix , admission to ICU.  Working diagnosis is possible pneumonitis or atypical pneumonia, cannot exclude pulmonary edema.  Cardiac enzyme trend is more consistent with demand ischemia than true ACS however.  She is on antibiotics at this time.  Lactate 1.6, WBC down to 10.3, ESR 93, UDS positive for THC.  Chest CTA negative for pulmonary embolus with diffuse groundglass densities throughout both lungs concerning for infiltrate.  Vital Signs    Vitals:   08/30/23 0600 08/30/23 0700 08/30/23 0744 08/30/23 0800  BP: 119/86 113/77  107/79  Pulse: 89 87 88 96  Resp: (!) 29 (!) 24 (!) 30 (!) 27  Temp: 98.6 F (37 C) 99.1 F (37.3 C)  99.1 F (37.3 C)  TempSrc:    Bladder  SpO2: 100% 100% 100% 95%  Weight:      Height:        Intake/Output Summary (Last 24 hours) at 08/30/2023 1030 Last data filed at 08/30/2023 0900 Gross per 24 hour  Intake 1022.05 ml  Output 940 ml  Net 82.05 ml   Filed Weights   08/29/23 1000  Weight: 94 kg    Physical Exam   GEN: Patient sedated on ventilator. Neck: No JVD. Cardiac: RRR, no gallop.  Respiratory: Coarse breath sounds and rhonchi anteriorly. GI: Soft, nontender, bowel sounds present. MS: No edema.  ECG/Telemetry    Telemetry reviewed showing sinus rhythm and sinus tachycardia.  Labs    Chemistry Recent Labs  Lab 08/29/23 1000 08/29/23 2009 08/30/23 0349 08/30/23 0856  NA 124* 126* 129* 128*  K 4.1 4.5 4.7 4.4  CL 94* 96*  --  100  CO2 16* 18*  --  14*  GLUCOSE 149* 115*  --  144*  BUN <5* 5*  --  10  CREATININE 0.71 0.75  --  0.93  CALCIUM  8.9 8.5*   --  8.8*  PROT 8.1  --   --   --   ALBUMIN 3.2*  --   --   --   AST 30  --   --   --   ALT 11  --   --   --   ALKPHOS 140*  --   --   --   BILITOT 0.6  --   --   --   GFRNONAA >60 >60  --  >60  ANIONGAP 14 12  --  14    Hematology Recent Labs  Lab 08/29/23 1000 08/30/23 0349 08/30/23 0856  WBC 14.1*  --  10.3  RBC 4.00  --  3.79*  HGB 12.5 13.3 11.4*  HCT 36.6 39.0 34.3*  MCV 91.5  --  90.5  MCH 31.3  --  30.1  MCHC 34.2  --  33.2  RDW 15.0  --  15.4  PLT 410*  --  384   Cardiac Enzymes Recent Labs  Lab 08/29/23 1037 08/29/23 1258 08/30/23 0400  TROPONINIHS 225* 210* 156*   Lipid Panel     Component Value Date/Time   CHOL 160 08/30/2023 0400   CHOL 181 03/04/2023 1138   TRIG 65 08/30/2023 0400   HDL 97 08/30/2023 0400   HDL 61 03/04/2023 1138   CHOLHDL  1.6 08/30/2023 0400   VLDL 13 08/30/2023 0400   LDLCALC 50 08/30/2023 0400   LDLCALC 111 (H) 03/04/2023 1138   LABVLDL 9 03/04/2023 1138    Cardiac Studies   Echocardiogram 08/29/2023:  1. Left ventricular ejection fraction, by estimation, is 65 to 70%. The  left ventricle has normal function. Left ventricular endocardial border  not optimally defined to evaluate regional wall motion - cannot exclude  basal posterolateral hypokinesis.  There is mild asymmetric left ventricular hypertrophy of the septal  segment. Left ventricular diastolic parameters are consistent with Grade I  diastolic dysfunction (impaired relaxation).   2. Right ventricular systolic function is normal. The right ventricular  size is normal. Tricuspid regurgitation signal is inadequate for assessing  PA pressure.   3. The mitral valve is grossly normal. Trivial mitral valve  regurgitation.   4. The aortic valve is tricuspid. Aortic valve regurgitation is not  visualized. No aortic stenosis is present.   5. The inferior vena cava is normal in size with greater than 50%  respiratory variability, suggesting right atrial pressure of 3  mmHg.   Assessment & Plan   1.  Acute hypoxic respiratory failure due to possible atypical pneumonia/pneumonitis, cannot exclude component of pulmonary edema.  Echocardiogram yesterday demonstrated LVEF 65 to 70% (endocardial borders not optimally visualized and cannot exclude basal posterolateral hypokinesis).  Cardiac enzyme trend is more consistent with demand ischemia than ACS.  UDS positive for THC, no other known recent drug exposures.  ESR elevated at 93, chest CTA negative for pulmonary embolus.  She is currently intubated and supported on the ventilator, otherwise antibiotics and steroids.  Management per CCM.  2.  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  States that she last had seizures in June and August 2024.  Reports no recent activity and states that she has been taking her medications regularly.   3.  Tobacco use.  4.  Chronic hyponatremia.  Discussed with CCM team.  Continue supportive measures for now.  Would complete 48-hour course of IV heparin , although at this point do not suspect true ACS, rather demand ischemia.  If this did represent an episode of acute diastolic heart failure however, still need to better understand etiology and she may ultimately need ischemic testing.  Treat for pneumonia/pneumonitis.  If clinically deteriorates would consider follow-up limited echocardiogram to ensure no decline in LVEF, although at this point myocarditis is not highly suspected.  For questions or updates, please contact [facility] Please consult [email or web address] for contact info under   Signed, Jayson Sierras, MD  08/30/2023, 10:30 AM

## 2023-08-30 NOTE — Progress Notes (Signed)
 Labs reviewed: Lactate earlier was 1.6, did get some diuretics, climbed to 2.1 resulting in repeat fluid challenge Procalcitonin negative Blood chemistry reviewed sodium holding at 128 from 129, continues to have progressive metabolic acidosis in the context of rising lactate Got additional fluid challenge Plan  repeat arterial blood gas and chemistry at 5 PM

## 2023-08-30 NOTE — Progress Notes (Addendum)
 Afternoon abg reviewed Still w/ sig metabolic acidosis.  Awaiting f/u chem Pretty much optimized from Ve standpoint.  She is wheezing fairly significantly Already on systemic steroids, will add LABA/yupelri  and inhaled steroids Not in shock.  If NAGMA on f/u chem might benefit from bicarb supplementation

## 2023-08-30 NOTE — Progress Notes (Addendum)
 PHARMACY - ANTICOAGULATION CONSULT NOTE  Pharmacy Consult for heparin  Indication: chest pain/ACS  Allergies  Allergen Reactions   Morphine Other (See Comments)    Unknown Reaction      Patient Measurements: Height: 5' 1 (154.9 cm) Weight: 94 kg (207 lb 3.7 oz) IBW/kg (Calculated) : 47.8 Heparin  Dosing Weight: 70 kg  Vital Signs: Temp: 99.1 F (37.3 C) (02/08 0800) Temp Source: Bladder (02/08 0800) BP: 107/79 (02/08 0800) Pulse Rate: 96 (02/08 0800)  Labs: Recent Labs    08/29/23 1000 08/29/23 1037 08/29/23 1258 08/29/23 1812 08/29/23 2009 08/29/23 2321 08/30/23 0349 08/30/23 0400 08/30/23 0856  HGB 12.5  --   --   --   --   --  13.3  --  11.4*  HCT 36.6  --   --   --   --   --  39.0  --  34.3*  PLT 410*  --   --   --   --   --   --   --  384  HEPARINUNFRC  --   --   --  <0.10*  --  0.24*  --   --  0.25*  CREATININE 0.71  --   --   --  0.75  --   --   --   --   TROPONINIHS  --  225* 210*  --   --   --   --  156*  --     Estimated Creatinine Clearance: 93.9 mL/min (by C-G formula based on SCr of 0.75 mg/dL).   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 1.12 mL 11 Past Month   medroxyPROGESTERone  (DEPO-PROVERA ) 150 MG/ML injection ADMINISTER 1 ML(150 MG) IN THE MUSCLE EVERY 3 MONTHS 1 mL 4 Past Week   ketoconazole  (NIZORAL ) 2 % cream Apply 1 Application topically 2 (two) times daily as needed for irritation. (Patient not taking: Reported on 08/29/2023) 60 g 1 Not Taking   meloxicam  (MOBIC ) 15 MG tablet Take 1 tablet (15 mg total) by mouth daily. (Patient not taking: Reported on 08/29/2023) 14 tablet 0 Not Taking   nortriptyline  (PAMELOR ) 25 MG capsule TAKE 2 CAPSULES BY MOUTH EVERY NIGHT 180 capsule 0    oxcarbazepine  (TRILEPTAL ) 600 MG tablet Take 1 tablet two times daily .      terbinafine  (LAMISIL ) 250 MG tablet Take 1 tablet (250 mg total) by mouth daily. 90 tablet 0    tiZANidine  (ZANAFLEX ) 4 MG tablet TAKE 1 TABLET BY MOUTH EVERY 6 HOURS  AS NEEDED FOR MUSCLE SPASMS 30 tablet 10    triamcinolone  (KENALOG ) 0.025 % ointment Apply 1 Application topically 2 (two) times daily as needed (itching). 30 g 0    Ubrogepant  (UBRELVY ) 100 MG TABS Take 1 tablet as needed at onset of migraine. Do not take more than 3 a week 10 tablet 5    Vitamin D , Ergocalciferol , (DRISDOL ) 1.25 MG (50000 UNIT) CAPS capsule Take 1 capsule (50,000 Units total) by mouth every 7 (seven) days. 12 capsule 2    zonisamide  (ZONEGRAN ) 100 MG capsule TAKE ONE (1) CAPSULE BY MOUTH ONCE DAILY IN THE MORNING AND TAKE FIVE (5) CAPSULES IN THE EVENING 540 capsule 3     Assessment: Pharmacy consulted to dose heparin  in patient with chest pain/ACS.  Patient is not on anticoagulation prior to admission.  Trop 225 >> 210 >> 156. Cards consulted.  Heparin  level subtherapeutic at 0.25. Hb 11.4, plt 384.    Goal of Therapy:  Heparin  level 0.3-0.7 units/ml Monitor platelets by anticoagulation protocol: Yes   Plan:  Increase heparin  to 1400 units/hr 6 hour heparin  level  Rankin Sams, PharmD, BCPS, BCCCP Clinical  Pharmacist

## 2023-08-30 NOTE — H&P (Signed)
 NAME:  KHLOE HUNKELE, MRN:  [id number], DOB:  10-May-1981, LOS: 1 ADMISSION DATE:  08/29/2023, CONSULTATION DATE:  2/8 REFERRING MD:  Dr. Manfred, CHIEF COMPLAINT:  respiratory failure   History of Present Illness:  A 43 year old female with pertinent PMH seizures secondary to prior left temporal lobe encephalocele s/p resection at [facility] 07/2022, chronic hyponatremia, tobacco abuse (0.5 ppd daily), smokes marijuana, and depression presents to [facility] ED on 2/7 with SOB, CP, chills, fatigue, and dry cough for 2-3 days without f/c/r, N/V/D, abd pain, rash, wheezing, or recent traveling. No alcohol or illicit drug use. Patient began having some pleuritic chest pain and came to [facility] ED on 2/7 for further eval.  On arrival patient was sinus tachycardia 110s and tachypneic with RR in 30-40s.  Initial temp 99 F.  Patient sats improved on [place].  CXR with cardiomegaly and diffuse pulmonary social opacity likely pulmonary edema less likely pneumonia.  Given Lasix .  COVID, flu, RSV negative.   D-dimer 1.99, BNP 171, troponin 225 then 210.  EKG with no ST elevation; anterolateral T wave inversions.  CTA chest with no PE; diffuse groundglass densities concerning for pneumonia.  WBC 14.1 and PCT 0.44.  Cultures obtained and placed on Rocephin  and azithromycin  for possible CAP.  Cardiology consulted.  Stat echo showing LVEF 65 to 70%; difficult to assess wall motion abnormality; mild diastolic dysfunction.  Patient started on heparin  IV for NSTEMI.  Later in evening patient with worsening respiratory distress and placed on BiPAP.  ABG 7.4, 28, 140, 17. Given albuterol neb and started on IV steroids.  Given repeat Lasix .  Patient still tachypneic with RR in 50s. Patient intubated and placed on vent.  PCCM consulted for ICU transfer to [facility].  Pertinent  Medical History   Past Medical History:  Diagnosis Date   Back pain    Chronic headaches    Depression    Fall 03/07/2021   Genital warts    herpes   Herpes    Lip injury  01/25/2021   Lower lip trauma on 01/19/2021 during seizure, treated at [facility] urgent care   Seizures [facility])    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 ([facility])   Vision loss, bilateral 05/08/2021     Significant Hospital Events: Including procedures, antibiotic start and stop dates in addition to other pertinent events   2/7 admitted to [facility] with chest pain and SOB Intubated   Interim History / Subjective:   Objective   Blood pressure (!) 131/95, pulse 99, temperature 100.2 F (37.9 C), resp. rate (!) 24, height 5' 1 (1.549 m), weight 94 kg, SpO2 100%.    Vent Mode: PRVC FiO2 (%):  [45 %-100 %] 100 % Set Rate:  [24 bmp] 24 bmp Vt Set:  [400 mL] 400 mL PEEP:  [5 cmH20] 5 cmH20   Intake/Output Summary (Last 24 hours) at 08/30/2023 0036 Last data filed at 08/29/2023 1900 Gross per 24 hour  Intake --  Output 200 ml  Net -200 ml   Filed Weights   08/29/23 1000  Weight: 94 kg    Examination: General: sedated with Fentanyl  and Propofol .   HENT: PERL, ETT 7.5. No LNE or thyromegaly. No JVD Lungs: symmetrical air entry bilaterally. Diffuse crackles. No wheezing Cardiovascular: NL S1/S2. No m/g/r Abdomen: no distension or tenderness Extremities: no edema. Symmetrical  Neuro: sedated     Resolved Hospital Problem list     Assessment & Plan:   Acute  respiratory failure with hypoxia -Secondary to cardiogenic pulmonary edema versus infectious CAP. ?inflammatory pneumonitis due to smoking marijuana  Plan: -CXR on arrival -LTVV strategy with tidal volumes of 6-8 cc/kg ideal body weight -check ABG and adjust settings accordingly  -Goal plateau pressures less than 30 and driving pressures less than 15 -Wean PEEP/FiO2 for SpO2 > 92% -VAP bundle in place -Daily SAT and SBT -PAD protocol in place -wean sedation for RASS goal 0 to -1 -Trach aspirate -Continue Rocephin  and azithromycin  for CAP Ppx -follow cultures -Check  urine Legionella and strep -One dose of Vanco -MRSA screening -Steroids -ISS -HbA1c -FLP  NSTEMI Plan: -cards consulted at [facility] -recommending IV heparin  and trending trop -Metoprolol , statin, and ASA  Hx of epilepsy: secondary to prior left temporal lobe encephalocele s/p resection at [facility] 07/2022 Plan: -resume home briviact , zonisamide , and trileptal   Chronic hyponatremia: baseline around 123 Plan: -trend bmp  Tobacco abuse Plan: -cessation counseling  Anxiety/depression Plan: -hold buspar  and nortriptyline  for now  Hx of migraines Plan: -hold home emgality  and ubrelvy    Best Practice (right click and Reselect all SmartList Selections daily)   Diet/type: NPO w/ meds via tube DVT prophylaxis systemic heparin  Pressure ulcer(s): N/A GI prophylaxis: H2B Lines: N/A Foley:  N/A Code Status:  full code Last date of multidisciplinary goals of care discussion [pending]  Labs   CBC: Recent Labs  Lab 08/29/23 1000  WBC 14.1*  NEUTROABS 13.1*  HGB 12.5  HCT 36.6  MCV 91.5  PLT 410*    Basic Metabolic Panel: Recent Labs  Lab 08/29/23 1000 08/29/23 2009  NA 124* 126*  K 4.1 4.5  CL 94* 96*  CO2 16* 18*  GLUCOSE 149* 115*  BUN <5* 5*  CREATININE 0.71 0.75  CALCIUM  8.9 8.5*   GFR: Estimated Creatinine Clearance: 93.9 mL/min (by C-G formula based on SCr of 0.75 mg/dL). Recent Labs  Lab 08/29/23 1000 08/29/23 1843 08/29/23 2009 08/29/23 2321  PROCALCITON  --  0.44  --   --   WBC 14.1*  --   --   --   LATICACIDVEN  --  1.8 2.1* 1.3    Liver Function Tests: Recent Labs  Lab 08/29/23 1000  AST 30  ALT 11  ALKPHOS 140*  BILITOT 0.6  PROT 8.1  ALBUMIN 3.2*   No results for input(s): LIPASE, AMYLASE in the last 168 hours. No results for input(s): AMMONIA  in the last 168 hours.  ABG    Component Value Date/Time   PHART 7.4 08/29/2023 2002   PCO2ART 28 (L) 08/29/2023 2002   PO2ART 140 (H) 08/29/2023 2002   HCO3 17.3 (L) 08/29/2023  2002   TCO2 25 04/26/2017 1640   ACIDBASEDEF 6.1 (H) 08/29/2023 2002   O2SAT 99.7 08/29/2023 2002     Coagulation Profile: No results for input(s): INR, PROTIME in the last 168 hours.  Cardiac Enzymes: No results for input(s): CKTOTAL, CKMB, CKMBINDEX, TROPONINI in the last 168 hours.  HbA1C: No results found for: HGBA1C  CBG: No results for input(s): GLUCAP in the last 168 hours.  Review of Systems:   Patient is  intubated; therefore, history has been obtained from chart review.    Past Medical History:  She,  has a past medical history of Back pain, Chronic headaches, Depression, Fall (03/07/2021), Genital warts, Herpes, Lip injury (01/25/2021), Seizures (HCC), and Vision loss, bilateral (05/08/2021).   Surgical History:   Past Surgical History:  Procedure Laterality Date   BRAIN SURGERY  07/30/2022   CESAREAN  SECTION     x 3     Social History:   reports that she has been smoking cigarettes. She has a 10 pack-year smoking history. She has been exposed to tobacco smoke. She has never used smokeless tobacco. She reports that she does not currently use alcohol after a past usage of about 2.0 standard drinks of alcohol per week. She reports current drug use. Drug: Marijuana.   Family History:  Her family history includes Healthy in her father; Hypertension in her mother.   Allergies Allergies  Allergen Reactions   Morphine Other (See Comments)    Unknown Reaction       Home Medications  Prior to Admission medications   Medication Sig Start Date End Date Taking? Authorizing Provider  BRIVIACT  100 MG TABS tablet TAKE 1 TABLET BY MOUTH TWICE DAILY 04/14/23  Yes Georjean Darice HERO, MD  busPIRone  (BUSPAR ) 5 MG tablet TAKE 1 TABLET(5 MG) BY MOUTH TWICE DAILY 06/09/23  Yes Antonetta Rollene BRAVO, MD  famciclovir  (FAMVIR ) 250 MG tablet TAKE ONE (1) TABLET BY MOUTH TWICE DAILY 08/25/23  Yes Antonetta Rollene BRAVO, MD  Galcanezumab -gnlm (EMGALITY ) 120 MG/ML SOAJ Inject  1 pen  into the skin every 30 (thirty) days. 02/20/23  Yes Georjean Darice HERO, MD  medroxyPROGESTERone  (DEPO-PROVERA ) 150 MG/ML injection ADMINISTER 1 ML(150 MG) IN THE MUSCLE EVERY 3 MONTHS 08/26/23  Yes Signa Nest A, NP  ketoconazole  (NIZORAL ) 2 % cream Apply 1 Application topically 2 (two) times daily as needed for irritation. Patient not taking: Reported on 08/29/2023 05/08/23   Antonetta Rollene BRAVO, MD  meloxicam  (MOBIC ) 15 MG tablet Take 1 tablet (15 mg total) by mouth daily. Patient not taking: Reported on 08/29/2023 08/14/23   Antonetta Rollene BRAVO, MD  nortriptyline  (PAMELOR ) 25 MG capsule TAKE 2 CAPSULES BY MOUTH EVERY NIGHT 04/01/23   Georjean Darice HERO, MD  oxcarbazepine  (TRILEPTAL ) 600 MG tablet Take 1 tablet two times daily . 02/07/23   Georjean Darice HERO, MD  terbinafine  (LAMISIL ) 250 MG tablet Take 1 tablet (250 mg total) by mouth daily. 01/13/23   Golda Lynwood PARAS, MD  tiZANidine  (ZANAFLEX ) 4 MG tablet TAKE 1 TABLET BY MOUTH EVERY 6 HOURS AS NEEDED FOR MUSCLE SPASMS 11/25/22   Georjean Darice HERO, MD  triamcinolone  (KENALOG ) 0.025 % ointment Apply 1 Application topically 2 (two) times daily as needed (itching). 11/11/22   Golda Lynwood PARAS, MD  Ubrogepant  (UBRELVY ) 100 MG TABS Take 1 tablet as needed at onset of migraine. Do not take more than 3 a week 02/05/23   Georjean Darice HERO, MD  Vitamin D , Ergocalciferol , (DRISDOL ) 1.25 MG (50000 UNIT) CAPS capsule Take 1 capsule (50,000 Units total) by mouth every 7 (seven) days. 08/14/23   Antonetta Rollene BRAVO, MD  zonisamide  (ZONEGRAN ) 100 MG capsule TAKE ONE (1) CAPSULE BY MOUTH ONCE DAILY IN THE MORNING AND TAKE FIVE (5) CAPSULES IN THE EVENING 01/20/23   Georjean Darice HERO, MD    D/w family  Critical care time: 38 minutes    Mancel Ply, MD [facility] 08/30/2023, 12:36 AM  Please see [email or web address] for pager details.  From 7A-7P if no response, please call [phone]. After hours, please call ELink [phone].

## 2023-08-31 ENCOUNTER — Inpatient Hospital Stay (HOSPITAL_COMMUNITY): Payer: 59

## 2023-08-31 DIAGNOSIS — J189 Pneumonia, unspecified organism: Secondary | ICD-10-CM | POA: Diagnosis not present

## 2023-08-31 DIAGNOSIS — J9601 Acute respiratory failure with hypoxia: Secondary | ICD-10-CM | POA: Diagnosis not present

## 2023-08-31 LAB — POCT I-STAT 7, (LYTES, BLD GAS, ICA,H+H)
Acid-base deficit: 5 mmol/L — ABNORMAL HIGH (ref 0.0–2.0)
Bicarbonate: 20.2 mmol/L (ref 20.0–28.0)
Calcium, Ion: 1.22 mmol/L (ref 1.15–1.40)
HCT: 30 % — ABNORMAL LOW (ref 36.0–46.0)
Hemoglobin: 10.2 g/dL — ABNORMAL LOW (ref 12.0–15.0)
O2 Saturation: 98 %
Patient temperature: 98.6
Potassium: 4.2 mmol/L (ref 3.5–5.1)
Sodium: 133 mmol/L — ABNORMAL LOW (ref 135–145)
TCO2: 21 mmol/L — ABNORMAL LOW (ref 22–32)
pCO2 arterial: 36.3 mm[Hg] (ref 32–48)
pH, Arterial: 7.354 (ref 7.35–7.45)
pO2, Arterial: 108 mm[Hg] (ref 83–108)

## 2023-08-31 LAB — GLUCOSE, CAPILLARY
Glucose-Capillary: 110 mg/dL — ABNORMAL HIGH (ref 70–99)
Glucose-Capillary: 114 mg/dL — ABNORMAL HIGH (ref 70–99)
Glucose-Capillary: 119 mg/dL — ABNORMAL HIGH (ref 70–99)
Glucose-Capillary: 122 mg/dL — ABNORMAL HIGH (ref 70–99)
Glucose-Capillary: 127 mg/dL — ABNORMAL HIGH (ref 70–99)
Glucose-Capillary: 149 mg/dL — ABNORMAL HIGH (ref 70–99)

## 2023-08-31 LAB — BASIC METABOLIC PANEL
Anion gap: 11 (ref 5–15)
Anion gap: 10 (ref 5–15)
BUN: 20 mg/dL (ref 6–20)
BUN: 16 mg/dL (ref 6–20)
CO2: 19 mmol/L — ABNORMAL LOW (ref 22–32)
CO2: 20 mmol/L — ABNORMAL LOW (ref 22–32)
Calcium: 8.3 mg/dL — ABNORMAL LOW (ref 8.9–10.3)
Calcium: 8.7 mg/dL — ABNORMAL LOW (ref 8.9–10.3)
Chloride: 104 mmol/L (ref 98–111)
Chloride: 104 mmol/L (ref 98–111)
Creatinine, Ser: 0.94 mg/dL (ref 0.44–1.00)
Creatinine, Ser: 0.79 mg/dL (ref 0.44–1.00)
GFR, Estimated: >60 mL/min (ref >60)
GFR, Estimated: >60 mL/min (ref >60)
Glucose, Bld: 115 mg/dL — ABNORMAL HIGH (ref 70–99)
Glucose, Bld: 125 mg/dL — ABNORMAL HIGH (ref 70–99)
Potassium: 4 mmol/L (ref 3.5–5.1)
Potassium: 4.7 mmol/L (ref 3.5–5.1)
Sodium: 134 mmol/L — ABNORMAL LOW (ref 135–145)
Sodium: 134 mmol/L — ABNORMAL LOW (ref 135–145)

## 2023-08-31 LAB — HIGH SENSITIVITY CRP: CRP, High Sensitivity: >300 mg/L — AB (ref 0.00–3.00)

## 2023-08-31 LAB — CBC
HCT: 31.1 % — ABNORMAL LOW (ref 36.0–46.0)
Hemoglobin: 10.3 g/dL — ABNORMAL LOW (ref 12.0–15.0)
MCH: 30.3 pg (ref 26.0–34.0)
MCHC: 33.1 g/dL (ref 30.0–36.0)
MCV: 91.5 fL (ref 80.0–100.0)
Platelets: 396 10*3/uL (ref 150–400)
RBC: 3.4 MIL/uL — ABNORMAL LOW (ref 3.87–5.11)
RDW: 15.5 % (ref 11.5–15.5)
WBC: 16.1 10*3/uL — ABNORMAL HIGH (ref 4.0–10.5)
nRBC: 0 % (ref 0.0–0.2)

## 2023-08-31 LAB — BRAIN NATRIURETIC PEPTIDE: B Natriuretic Peptide: 43.4 pg/mL (ref 0.0–100.0)

## 2023-08-31 LAB — HEPARIN LEVEL (UNFRACTIONATED): Heparin Unfractionated: 0.42 [IU]/mL (ref 0.30–0.70)

## 2023-08-31 LAB — MAGNESIUM
Magnesium: 2.1 mg/dL (ref 1.7–2.4)
Magnesium: 2.2 mg/dL (ref 1.7–2.4)

## 2023-08-31 LAB — PROCALCITONIN: Procalcitonin: 0.3 ng/mL

## 2023-08-31 LAB — PHOSPHORUS
Phosphorus: 4.5 mg/dL (ref 2.5–4.6)
Phosphorus: 3.8 mg/dL (ref 2.5–4.6)

## 2023-08-31 LAB — LACTIC ACID, PLASMA: Lactic Acid, Venous: 1.2 mmol/L (ref 0.5–1.9)

## 2023-08-31 MED ORDER — SODIUM CHLORIDE 0.9 % IV SOLN
100.0000 mg | Freq: Two times a day (BID) | INTRAVENOUS | Status: DC
  Administered 2023-08-31 – 2023-09-01 (×2): 100 mg via INTRAVENOUS
  Filled 2023-08-31 (×3): qty 10

## 2023-08-31 MED ORDER — METHYLPREDNISOLONE SODIUM SUCC 125 MG IJ SOLR
80.0000 mg | Freq: Every day | INTRAMUSCULAR | Status: DC
  Administered 2023-08-31 – 2023-09-01 (×2): 80 mg via INTRAVENOUS
  Filled 2023-08-31 (×2): qty 2

## 2023-08-31 MED ORDER — BRIVARACETAM 10 MG/ML PO SOLN
100.0000 mg | Freq: Two times a day (BID) | ORAL | Status: DC
  Filled 2023-08-31: qty 10

## 2023-08-31 NOTE — Progress Notes (Signed)
 Brief Nutrition Note  Consult received for enteral/tube feeding initiation and management.  Adult Enteral Nutrition Protocol initiated. Full assessment to follow.  OG tube in place with tip located in stomach per xray imaging.   Admitting Dx: Hypoxia [R09.02] Elevated troponin [R79.89] Acute respiratory failure with hypoxia (HCC) [J96.01]  Body mass index is 39.91 kg/m.   Labs:  Recent Labs  Lab 08/30/23 0856 08/30/23 0859 08/30/23 1659 08/30/23 1726 08/31/23 0233  NA 128*  --  131* 130* 134*  K 4.4  --  4.5 4.0 4.0  CL 100  --  100  --  104  CO2 14*  --  17*  --  19*  BUN 10  --  15  --  20  CREATININE 0.93  --  1.12*  --  0.94  CALCIUM  8.8*  --  8.8*  --  8.3*  MG  --  2.2 2.2  --  2.1  PHOS  --  4.9* 5.3*  --  4.5  GLUCOSE 144*  --  119*  --  115*   Vital HP Pepup  at 2ml/hr,  Prosource TF 60ml Daily. MD authorized advancement to a goal rate per RD recommendations.   Jenna Pew RDN, LDN Clinical Dietitian   If unable to reach, please contact RD Inpatient secure chat group between 8 am-4 pm daily

## 2023-08-31 NOTE — Progress Notes (Signed)
 Patient is now more synchronous with the vent, however she continues to use accessory muscles and have an increase respiratory rate 26-30 bpm.

## 2023-08-31 NOTE — Plan of Care (Signed)
  Problem: Clinical Measurements: Goal: Respiratory complications will improve Outcome: Not Progressing  Unchanged- Bilateral wheezes

## 2023-08-31 NOTE — Progress Notes (Signed)
 Progress Note  Patient Name: [name] Date of Encounter: 08/31/2023  Primary Cardiologist: New to [facility]  Interval Summary   Chart reviewed.  Patient remains sedated and on the ventilator.  Wheezing has been more evident recently, she remains on broad-spectrum antibiotics with steroid dose being increased as well.  Follow-up chest x-ray pending.  Vital Signs    Vitals:   08/31/23 0800 08/31/23 0900 08/31/23 1000 08/31/23 1050  BP: 109/66 103/64 111/76   Pulse: (!) 104 (!) 106 98 95  Resp: (!) 38 (!) 28 (!) 25 (!) 27  Temp: 99.7 F (37.6 C) 99.9 F (37.7 C) 99.9 F (37.7 C)   TempSrc:      SpO2: 100% 97% 100% 96%  Weight:      Height:        Intake/Output Summary (Last 24 hours) at 08/31/2023 1148 Last data filed at 08/31/2023 1000 Gross per 24 hour  Intake 4013.99 ml  Output 1080 ml  Net 2933.99 ml   Filed Weights   08/29/23 1000 08/31/23 0411  Weight: 94 kg 95.8 kg    Physical Exam   GEN: Patient sedated on ventilator. Neck: No JVD. Cardiac: RRR, no gallop.  Respiratory: Coarse breath sounds and wheeze anteriorly. GI: Soft, nontender, bowel sounds present. MS: No edema.  ECG/Telemetry    Telemetry reviewed showing sinus rhythm and sinus tachycardia.  Labs    Chemistry Recent Labs  Lab 08/29/23 1000 08/29/23 2009 08/30/23 0856 08/30/23 1659 08/30/23 1726 08/31/23 0233 08/31/23 1047  NA 124*   < > 128* 131* 130* 134* 133*  K 4.1   < > 4.4 4.5 4.0 4.0 4.2  CL 94*   < > 100 100  --  104  --   CO2 16*   < > 14* 17*  --  19*  --   GLUCOSE 149*   < > 144* 119*  --  115*  --   BUN <5*   < > 10 15  --  20  --   CREATININE 0.71   < > 0.93 1.12*  --  0.94  --   CALCIUM  8.9   < > 8.8* 8.8*  --  8.3*  --   PROT 8.1  --   --   --   --   --   --   ALBUMIN 3.2*  --   --   --   --   --   --   AST 30  --   --   --   --   --   --   ALT 11  --   --   --   --   --   --   ALKPHOS 140*  --   --   --   --   --   --   BILITOT 0.6  --   --    --   --   --   --   GFRNONAA >60   < > >60 >60  --  >60  --   ANIONGAP 14   < > 14 14  --  11  --    < > = values in this interval not displayed.    Hematology Recent Labs  Lab 08/29/23 1000 08/30/23 0349 08/30/23 0856 08/30/23 1726 08/31/23 0233 08/31/23 1047  WBC 14.1*  --  10.3  --  16.1*  --   RBC 4.00  --  3.79*  --  3.40*  --  HGB 12.5   < > 11.4* 10.9* 10.3* 10.2*  HCT 36.6   < > 34.3* 32.0* 31.1* 30.0*  MCV 91.5  --  90.5  --  91.5  --   MCH 31.3  --  30.1  --  30.3  --   MCHC 34.2  --  33.2  --  33.1  --   RDW 15.0  --  15.4  --  15.5  --   PLT 410*  --  384  --  396  --    < > = values in this interval not displayed.   Cardiac Enzymes Recent Labs  Lab 08/29/23 1037 08/29/23 1258 08/30/23 0400  TROPONINIHS 225* 210* 156*   Lipid Panel     Component Value Date/Time   CHOL 160 08/30/2023 0400   CHOL 181 03/04/2023 1138   TRIG 65 08/30/2023 0400   HDL 97 08/30/2023 0400   HDL 61 03/04/2023 1138   CHOLHDL 1.6 08/30/2023 0400   VLDL 13 08/30/2023 0400   LDLCALC 50 08/30/2023 0400   LDLCALC 111 (H) 03/04/2023 1138   LABVLDL 9 03/04/2023 1138    Cardiac Studies   Echocardiogram 08/29/2023:  1. Left ventricular ejection fraction, by estimation, is 65 to 70%. The  left ventricle has normal function. Left ventricular endocardial border  not optimally defined to evaluate regional wall motion - cannot exclude  basal posterolateral hypokinesis.  There is mild asymmetric left ventricular hypertrophy of the septal  segment. Left ventricular diastolic parameters are consistent with Grade I  diastolic dysfunction (impaired relaxation).   2. Right ventricular systolic function is normal. The right ventricular  size is normal. Tricuspid regurgitation signal is inadequate for assessing  PA pressure.   3. The mitral valve is grossly normal. Trivial mitral valve  regurgitation.   4. The aortic valve is tricuspid. Aortic valve regurgitation is not  visualized. No  aortic stenosis is present.   5. The inferior vena cava is normal in size with greater than 50%  respiratory variability, suggesting right atrial pressure of 3 mmHg.   Assessment & Plan   1.  Acute hypoxic respiratory failure due to possible atypical pneumonia/pneumonitis, cannot exclude component of pulmonary edema.  Echocardiogram demonstrates LVEF 65 to 70% (endocardial borders not optimally visualized and cannot exclude basal posterolateral hypokinesis).  Cardiac enzyme trend is more consistent with demand ischemia than ACS.  UDS positive for THC, no other known recent drug exposures.  ESR elevated at 93, chest CTA negative for pulmonary embolus.  She is currently intubated and supported on the ventilator, otherwise antibiotics and steroids.  Management per CCM.  2.  Epilepsy felt secondary to left temporal lobe encephalocele status post surgical resection at [facility] in January 2024 and followed by neurology.  States that she last had seizures in June and August 2024.  Reports no recent activity and states that she has been taking her medications regularly.   3.  Tobacco use.  4.  Chronic hyponatremia.  Continue supportive measures and management by CCM.  Complete 48 hours of heparin  today and discontinue.  Holding off on further ischemic workup given current clinical picture which looks to be more pulmonary in etiology.  For questions or updates, please contact [facility] Please consult [email or web address] for contact info under   Signed, Jayson Sierras, MD  08/31/2023, 11:48 AM

## 2023-08-31 NOTE — Progress Notes (Addendum)
 Patient with increase wheezes, notified respiratory for PRN breathing tx.

## 2023-08-31 NOTE — Progress Notes (Signed)
 OG tube found at 40cm. Previously at 60. Feedings pause, OG tube readvanced. MD notified. KUB done.   Patient suctioned, no change from previous secretions. No evidence of tube feeds in the mouth. Oral care performed

## 2023-08-31 NOTE — Progress Notes (Signed)
 PHARMACY - ANTICOAGULATION CONSULT NOTE  Pharmacy Consult for heparin  Indication: chest pain/ACS  Allergies  Allergen Reactions   Morphine Other (See Comments)    Unknown Reaction      Patient Measurements: Height: 5' 1 (154.9 cm) Weight: 95.8 kg (211 lb 3.2 oz) IBW/kg (Calculated) : 47.8 Heparin  Dosing Weight: 70 kg  Vital Signs: Temp: 99.5 F (37.5 C) (02/09 0700) Temp Source: Bladder (02/09 0400) BP: 119/66 (02/09 0700) Pulse Rate: 105 (02/09 0755)  Labs: Recent Labs    08/29/23 1000 08/29/23 1037 08/29/23 1258 08/29/23 1812 08/30/23 0400 08/30/23 0856 08/30/23 1659 08/30/23 1726 08/31/23 0233  HGB 12.5  --   --    < >  --  11.4*  --  10.9* 10.3*  HCT 36.6  --   --    < >  --  34.3*  --  32.0* 31.1*  PLT 410*  --   --   --   --  384  --   --  396  HEPARINUNFRC  --   --   --    < >  --  0.25* 0.30  --  0.42  CREATININE 0.71  --   --    < >  --  0.93 1.12*  --  0.94  TROPONINIHS  --  225* 210*  --  156*  --   --   --   --    < > = values in this interval not displayed.    Estimated Creatinine Clearance: 80.8 mL/min (by C-G formula based on SCr of 0.94 mg/dL).   Medical History: Past Medical History:  Diagnosis Date   Back pain    Chronic headaches    Depression    Fall 03/07/2021   Genital warts    herpes   Herpes    Lip injury 01/25/2021   Lower lip trauma on 01/19/2021 during seizure, treated at [facility] urgent care   Seizures [facility])    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 ([facility])   Vision loss, bilateral 05/08/2021    Medications:  Medications Prior to Admission  Medication Sig Dispense Refill Last Dose/Taking   BRIVIACT  100 MG TABS tablet TAKE 1 TABLET BY MOUTH TWICE DAILY 60 tablet 5 08/29/2023 Morning   busPIRone  (BUSPAR ) 5 MG tablet TAKE 1 TABLET(5 MG) BY MOUTH TWICE DAILY 60 tablet 2 08/29/2023 Morning   famciclovir  (FAMVIR ) 250 MG tablet TAKE ONE (1) TABLET BY MOUTH TWICE DAILY 60  tablet 10 08/29/2023 Morning   Galcanezumab -gnlm (EMGALITY ) 120 MG/ML SOAJ Inject 1 pen  into the skin every 30 (thirty) days. 1.12 mL 11 Past Month   medroxyPROGESTERone  (DEPO-PROVERA ) 150 MG/ML injection ADMINISTER 1 ML(150 MG) IN THE MUSCLE EVERY 3 MONTHS 1 mL 4 Past Week   ketoconazole  (NIZORAL ) 2 % cream Apply 1 Application topically 2 (two) times daily as needed for irritation. (Patient not taking: Reported on 08/29/2023) 60 g 1 Not Taking   meloxicam  (MOBIC ) 15 MG tablet Take 1 tablet (15 mg total) by mouth daily. (Patient not taking: Reported on 08/29/2023) 14 tablet 0 Not Taking   nortriptyline  (PAMELOR ) 25 MG capsule TAKE 2 CAPSULES BY MOUTH EVERY NIGHT 180 capsule 0    oxcarbazepine  (TRILEPTAL ) 600 MG tablet Take 1 tablet two times daily .      terbinafine  (LAMISIL ) 250 MG tablet Take 1 tablet (250 mg total) by mouth daily. 90 tablet 0    tiZANidine  (ZANAFLEX ) 4 MG tablet TAKE 1 TABLET  BY MOUTH EVERY 6 HOURS AS NEEDED FOR MUSCLE SPASMS 30 tablet 10    triamcinolone  (KENALOG ) 0.025 % ointment Apply 1 Application topically 2 (two) times daily as needed (itching). 30 g 0    Ubrogepant  (UBRELVY ) 100 MG TABS Take 1 tablet as needed at onset of migraine. Do not take more than 3 a week 10 tablet 5    Vitamin D , Ergocalciferol , (DRISDOL ) 1.25 MG (50000 UNIT) CAPS capsule Take 1 capsule (50,000 Units total) by mouth every 7 (seven) days. 12 capsule 2    zonisamide  (ZONEGRAN ) 100 MG capsule TAKE ONE (1) CAPSULE BY MOUTH ONCE DAILY IN THE MORNING AND TAKE FIVE (5) CAPSULES IN THE EVENING 540 capsule 3     Assessment: Pharmacy consulted to dose heparin  in patient with chest pain/ACS.  Patient is not on anticoagulation prior to admission.  Trop 225 >> 210 >> 156. Cards consulted.  Heparin  level at goal (0.42) on 1500 units/hr.  Hb 10.3, plt 396.   Goal of Therapy:  Heparin  level 0.3-0.7 units/ml Monitor platelets by anticoagulation protocol: Yes   Plan:  Continue heparin  to 1500 units/hr Per  cardiology - heparin  for 48 hours, this will occur today around noon. Will discuss with provider and update orders accordingly.   Rankin Sams, PharmD, BCPS, BCCCP Clinical Pharmacist

## 2023-09-01 ENCOUNTER — Inpatient Hospital Stay (HOSPITAL_COMMUNITY): Payer: 59

## 2023-09-01 DIAGNOSIS — J9601 Acute respiratory failure with hypoxia: Secondary | ICD-10-CM | POA: Diagnosis not present

## 2023-09-01 DIAGNOSIS — J189 Pneumonia, unspecified organism: Secondary | ICD-10-CM | POA: Diagnosis not present

## 2023-09-01 LAB — POCT I-STAT 7, (LYTES, BLD GAS, ICA,H+H)
Acid-base deficit: 4 mmol/L — ABNORMAL HIGH (ref 0.0–2.0)
Bicarbonate: 21.8 mmol/L (ref 20.0–28.0)
Calcium, Ion: 1.24 mmol/L (ref 1.15–1.40)
HCT: 29 % — ABNORMAL LOW (ref 36.0–46.0)
Hemoglobin: 9.9 g/dL — ABNORMAL LOW (ref 12.0–15.0)
O2 Saturation: 96 %
Patient temperature: 37.4
Potassium: 4.8 mmol/L (ref 3.5–5.1)
Sodium: 134 mmol/L — ABNORMAL LOW (ref 135–145)
TCO2: 23 mmol/L (ref 22–32)
pCO2 arterial: 41.3 mm[Hg] (ref 32–48)
pH, Arterial: 7.332 — ABNORMAL LOW (ref 7.35–7.45)
pO2, Arterial: 89 mm[Hg] (ref 83–108)

## 2023-09-01 LAB — LEGIONELLA PNEUMOPHILA SEROGP 1 UR AG: L. pneumophila Serogp 1 Ur Ag: NEGATIVE

## 2023-09-01 LAB — CBC
HCT: 30.6 % — ABNORMAL LOW (ref 36.0–46.0)
Hemoglobin: 10.2 g/dL — ABNORMAL LOW (ref 12.0–15.0)
MCH: 30.7 pg (ref 26.0–34.0)
MCHC: 33.3 g/dL (ref 30.0–36.0)
MCV: 92.2 fL (ref 80.0–100.0)
Platelets: 413 10*3/uL — ABNORMAL HIGH (ref 150–400)
RBC: 3.32 MIL/uL — ABNORMAL LOW (ref 3.87–5.11)
RDW: 15.7 % — ABNORMAL HIGH (ref 11.5–15.5)
WBC: 10.7 10*3/uL — ABNORMAL HIGH (ref 4.0–10.5)
nRBC: 0 % (ref 0.0–0.2)

## 2023-09-01 LAB — BASIC METABOLIC PANEL
Anion gap: 13 (ref 5–15)
BUN: 13 mg/dL (ref 6–20)
CO2: 19 mmol/L — ABNORMAL LOW (ref 22–32)
Calcium: 9 mg/dL (ref 8.9–10.3)
Chloride: 103 mmol/L (ref 98–111)
Creatinine, Ser: 0.63 mg/dL (ref 0.44–1.00)
GFR, Estimated: >60 mL/min (ref >60)
Glucose, Bld: 133 mg/dL — ABNORMAL HIGH (ref 70–99)
Potassium: 4.6 mmol/L (ref 3.5–5.1)
Sodium: 135 mmol/L (ref 135–145)

## 2023-09-01 LAB — LIPASE, BLOOD: Lipase: 18 U/L (ref 11–51)

## 2023-09-01 LAB — GLUCOSE, CAPILLARY
Glucose-Capillary: 100 mg/dL — ABNORMAL HIGH (ref 70–99)
Glucose-Capillary: 100 mg/dL — ABNORMAL HIGH (ref 70–99)
Glucose-Capillary: 104 mg/dL — ABNORMAL HIGH (ref 70–99)
Glucose-Capillary: 108 mg/dL — ABNORMAL HIGH (ref 70–99)
Glucose-Capillary: 116 mg/dL — ABNORMAL HIGH (ref 70–99)
Glucose-Capillary: 95 mg/dL (ref 70–99)

## 2023-09-01 LAB — TRIGLYCERIDES: Triglycerides: 56 mg/dL (ref <150)

## 2023-09-01 LAB — BRAIN NATRIURETIC PEPTIDE: B Natriuretic Peptide: 97.5 pg/mL (ref 0.0–100.0)

## 2023-09-01 MED ORDER — ZONISAMIDE 100 MG PO CAPS
500.0000 mg | ORAL_CAPSULE | Freq: Every evening | ORAL | Status: DC
  Administered 2023-09-02 – 2023-09-04 (×3): 500 mg via ORAL
  Filled 2023-09-01 (×4): qty 5

## 2023-09-01 MED ORDER — FUROSEMIDE 10 MG/ML IJ SOLN
40.0000 mg | Freq: Two times a day (BID) | INTRAMUSCULAR | Status: AC
  Administered 2023-09-01 (×2): 40 mg via INTRAVENOUS
  Filled 2023-09-01 (×2): qty 4

## 2023-09-01 MED ORDER — ENOXAPARIN SODIUM 40 MG/0.4ML IJ SOSY
40.0000 mg | PREFILLED_SYRINGE | INTRAMUSCULAR | Status: DC
  Administered 2023-09-01 – 2023-09-04 (×4): 40 mg via SUBCUTANEOUS
  Filled 2023-09-01 (×5): qty 0.4

## 2023-09-01 MED ORDER — ZONISAMIDE 100 MG/5ML PO SUSP
100.0000 mg | Freq: Two times a day (BID) | ORAL | Status: DC
  Administered 2023-09-01: 100 mg
  Filled 2023-09-01 (×2): qty 5

## 2023-09-01 MED ORDER — BRIVARACETAM 100 MG PO TABS: 100.0000 mg | ORAL_TABLET | Freq: Two times a day (BID) | ORAL | Status: DC

## 2023-09-01 MED ORDER — ZONISAMIDE 100 MG PO CAPS
100.0000 mg | ORAL_CAPSULE | Freq: Two times a day (BID) | ORAL | Status: DC
  Filled 2023-09-01: qty 1

## 2023-09-01 MED ORDER — METOPROLOL TARTRATE 5 MG/5ML IV SOLN
2.5000 mg | Freq: Two times a day (BID) | INTRAVENOUS | Status: DC
  Administered 2023-09-01 – 2023-09-03 (×5): 2.5 mg via INTRAVENOUS
  Filled 2023-09-01 (×6): qty 5

## 2023-09-01 MED ORDER — ZONISAMIDE 100 MG PO CAPS
100.0000 mg | ORAL_CAPSULE | Freq: Every morning | ORAL | Status: DC
  Administered 2023-09-02 – 2023-09-05 (×4): 100 mg via ORAL
  Filled 2023-09-01 (×4): qty 1

## 2023-09-01 MED ORDER — OSMOLITE 1.2 CAL PO LIQD
1000.0000 mL | ORAL | Status: DC
  Filled 2023-09-01 (×2): qty 1000

## 2023-09-01 MED ORDER — SODIUM CHLORIDE 0.9 % IV SOLN
100.0000 mg | Freq: Two times a day (BID) | INTRAVENOUS | Status: DC
  Administered 2023-09-01: 100 mg via INTRAVENOUS
  Filled 2023-09-01 (×3): qty 10

## 2023-09-01 NOTE — Telephone Encounter (Signed)
 Copied from CRM [id number]. Topic: Clinical - Medical Advice >> Aug 29, 2023  8:09 AM Pattie Borders B wrote: Reason for CRM: Patient calling because was exposed to someone with Flu and is now coughing and short of breath at times. Had flu shot x2 weeks ago, would like to be seen or speak to nurse. [phone]

## 2023-09-01 NOTE — Procedures (Signed)
 Extubation Procedure Note  Patient Details:   Name: [name] DOB: 1980-10-13 MRN: [id number]   Airway Documentation:    Vent end date: 09/01/23 Vent end time: 1540   Evaluation  O2 sats: stable throughout Complications: No apparent complications Patient did tolerate procedure well. Bilateral Breath Sounds: Expiratory wheezes, Rhonchi   Yes  Pt extubated per MD order placed on 4L [place]. Positive cuff leak heard, no stridor. RT will continue to monitor  Kaylany Tesoriero 09/01/2023, 3:45 PM

## 2023-09-01 NOTE — Progress Notes (Signed)
   Patient Name: [name] Date of Encounter: 09/01/2023 [facility] Cardiologist: None    Interval Summary  .    Patient remains intubated and sedated at time of my evaluation   Vital Signs .    Vitals:   09/01/23 0000 09/01/23 0317 09/01/23 0743 09/01/23 0755  BP:      Pulse:      Resp:  (!) 24    Temp: 98 F (36.7 C)     TempSrc: Axillary   Bladder  SpO2:   99%   Weight:      Height:        Intake/Output Summary (Last 24 hours) at 09/01/2023 1004 Last data filed at 09/01/2023 0600 Gross per 24 hour  Intake 1376.77 ml  Output 1475 ml  Net -98.23 ml      08/31/2023    4:11 AM 08/29/2023   10:00 AM 08/14/2023    1:01 PM  Last 3 Weights  Weight (lbs) 211 lb 3.2 oz 207 lb 3.7 oz 207 lb 1.3 oz  Weight (kg) 95.8 kg 94 kg 93.931 kg      Telemetry/ECG    NSR - Personally Reviewed  Physical Exam .   GEN: No acute distress.  Laying flat in the bed  Neck: No JVD Cardiac:  RRR, no murmurs, rubs, or gallops.  Respiratory: Anterior lung exam clear. Patient is intubated  GI: Soft, nontender, non-distended  MS: No edema in BLE   Assessment & Plan .    Elevated Troponin  - Patient currently admitted with acute hypoxic respiratory failure secondary to rhinovirus viral PNA and CAP  - hsTn  225>>210>>156  - Echocardiogram this admission showed EF 65-70%, mild LVH, grade I DD, normal RV function  - Completed 48 hours of IV heparin - completed yesterday afternoon - Likely demand ischemia in the setting of pneumonia, hypoxia. No plans for ischemic evaluation at this time  - Continue ASA, lipitor, metoprolol    Acute Hypoxic Respiratory Failure  - As above, admitted with pneumonia/pneumonitis. She was given IV lasix  on 2/7 with limited response. Seems to be more pulmonary in etiology, edema less likely  - Echo with normal EF, normal RV function. BNP 97 yesterday  - Remains intubated. On antibiotics and breathing treatments per PCCM   Otherwise per primary  -  Epilepsy  - Chronic hyponatremia  - Metabolic Acidosis, NAGMA - Anxiety, depression  - Migraines   For questions or updates, please contact [facility] Please consult [email or web address] for contact info under        Signed, Debria Fang, PA-C

## 2023-09-01 NOTE — Progress Notes (Signed)
 NAME:  [name], MRN:  [id number], DOB:  [birth year], LOS: 3 ADMISSION DATE:  08/29/2023, CONSULTATION DATE:  2/8 REFERRING MD:  Dr. Elyse Hand, CHIEF COMPLAINT:  respiratory failure   History of Present Illness:   A 43 year old female with pertinent PMH seizures secondary to prior left temporal lobe encephalocele s/p resection at [facility] 07/2022, chronic hyponatremia, tobacco abuse (0.5 ppd daily), smokes marijuana, and depression presents to [facility] ED on 2/7 with SOB, CP, chills, fatigue, and dry cough for 2-3 days without f/c/r, N/V/D, abd pain, rash, wheezing, or recent traveling. No alcohol or illicit drug use. Patient began having some pleuritic chest pain and came to [facility] ED on 2/7 for further eval.  On arrival patient was sinus tachycardia 110s and tachypneic with RR in 30-40s.  Initial temp 99 F.  Patient sats improved on [place].  CXR with cardiomegaly and diffuse pulmonary  opacity likely pulmonary edema less likely pneumonia.  Given Lasix .  COVID, flu, RSV negative.   D-dimer 1.99, BNP 171, troponin 225 then 210.  EKG with no ST elevation; anterolateral T wave inversions.  CTA chest with no PE; diffuse groundglass densities concerning for pneumonia.  WBC 14.1 and PCT 0.44.  Cultures obtained and placed on Rocephin  and azithromycin  for possible CAP.  Cardiology consulted.  Stat echo showing LVEF 65 to 70%; difficult to assess wall motion abnormality; mild diastolic dysfunction.  Patient started on heparin  IV for NSTEMI.  Later in evening patient with worsening respiratory distress and placed on BiPAP.  ABG 7.4, 28, 140, 17. Given albuterol neb and started on IV steroids.  Given repeat Lasix .  Patient still tachypneic with RR in 50s. Patient intubated and placed on vent.  PCCM consulted for ICU transfer to [facility].  Pertinent  Medical History   Past Medical History:  Diagnosis Date   Back pain    Chronic headaches    Depression    Fall 03/07/2021   Genital warts    herpes   Herpes    Lip injury  01/25/2021   Lower lip trauma on 01/19/2021 during seizure, treated at [facility] urgent care   Seizures [facility])    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 ([facility])   Vision loss, bilateral 05/08/2021    Significant Hospital Events: Including procedures, antibiotic start and stop dates in addition to other pertinent events   2/7 admitted to [facility] with chest pain and SOB Intubated  2/8 now at [facility].  Continuing antibiotics, IV diuresis, systemic steroids, following up on cultures remains on heparin . RVP positive for rhinovirus, ustrep neg, MRSA PCR pos. Sig bronchospasm. BDs and ICS optimized.  2/9 still having some vent synchrony difficulty. Increased prop ceiling to 75. Changed vent to PCV seems to be more comfortable. Metabolic acidosis improved. Still w/ sig wheeze. Change steroids to 40mg  bid   Interim History / Subjective:   Switched to pressure control overnight due to vent dyssynchrony.  Objective   Blood pressure 105/72, pulse 84, temperature 98 F (36.7 C), temperature source Axillary, resp. rate (!) 24, height 5\' 1"  (1.549 m), weight 95.8 kg, SpO2 99%.    Vent Mode: PSV;CPAP FiO2 (%):  [40 %] 40 % Set Rate:  [24 bmp] 24 bmp Vt Set:  [330 mL] 330 mL PEEP:  [5 cmH20-8 cmH20] 5 cmH20 Pressure Support:  [8 cmH20] 8 cmH20 Plateau Pressure:  [12 cmH20-20 cmH20] 20 cmH20   Intake/Output Summary (Last 24 hours) at 09/01/2023 1478 Last data filed  at 09/01/2023 0600 Gross per 24 hour  Intake 1495.12 ml  Output 1600 ml  Net -104.88 ml   Filed Weights   08/29/23 1000 08/31/23 0411  Weight: 94 kg 95.8 kg    Examination: Gen:      No acute distress HEENT:  EOMI, sclera anicteric Neck:     No masses; no thyromegaly Lungs:    Clear to auscultation bilaterally; normal respiratory effort CV:         Regular rate and rhythm; no murmurs Abd:      + bowel sounds; soft, non-tender; no palpable masses, no distension Ext:    No edema;  adequate peripheral perfusion Skin:      Warm and dry; no rash Neuro:  Sedated   Lab/imaging reviewed Significant for sodium 135, BUN/creatinine 13/0.63 WBC 10.7, hemoglobin 10.2, platelets 413 Respiratory virus panel positive for rhinovirus Chest x-ray with pulmonary vascular congestion, bilateral airspace opacities.  General this is a otherwise healthy appearing 43 year old female. She is sedated on vent but in no distress HENT NCAT no JVD MMM orally intubated Pulm diffuse wheeze and rhonchi. Very asynchronous in PRVC, on PSV volumes in 500 on 12/8 but had prolonged exp phase and sig WOB so changed to PCV. Appears more comfortable. PCXR today: on-going diffuse bilateral airspace disease Card RRR  Abd soft tol TF Gu cl yellow  Neuro opens eyes follows commands does get anxious and RR rises  Ext no edema   Resolved Hospital Problem list   Lactic acidosis   Assessment & Plan:   Acute respiratory failure with hypoxia 2/2 Rhinovirus viral PNA and CAP (NOS) w/ ALI  and complicated by on-going bronchospasm seemingly more PNA than edema given limited response to lasix   Plan Continue pressure control ventilator (currently PSV weans as tolerated Continue ceftriaxone , azithromycin , systemic steroids Bronchodilators  NSTEMI Trops down trending Plan: Received 48 hours of IV heparin  VT asa and statin Metoprolol  12.5 mg VT BID Lasix  40 mg IV q12 Cards following  Metabolic acidosis / NAGMA & also mod lactic acidosis  and rising cr  Acid base initially improving, then worse w/ rising scr. Improved w/ IVFs Plan Repeat lactate for clearance   Hx of epilepsy: secondary to prior left temporal lobe encephalocele s/p resection at [facility] 07/2022 Plan: Continue briviact , zonisamide , and trileptal   Chronic hyponatremia: baseline around 124, I suspect this is secondary to some of her antilipid medications, most recently sodium 134,  think she is euvolemic at this point Plan: Cont to monitor    Glycemic control Plan Ssi goal 140-180  Tobacco abuse Plan: cessation counseling when appropriate   Anxiety/depression Plan: Cont  buspar  and nortriptyline  VT  Hx of migraines Plan: Hold home emgality  and ubrelvy   Best Practice (right click and "Reselect all SmartList Selections" daily)   Diet/type: tubefeeds DVT prophylaxis systemic heparin  Pressure ulcer(s): N/A GI prophylaxis: H2B Lines: N/A Foley:  N/A Code Status:  full code Last date of multidisciplinary goals of care discussion [pending]  Critical care time:    The patient is critically ill with multiple organ system failure and requires high complexity decision making for assessment and support, frequent evaluation and titration of therapies, advanced monitoring, review of radiographic studies and interpretation of complex data.   Critical Care Time devoted to patient care services, exclusive of separately billable procedures, described in this note is 35 minutes.   Jimmey Hengel MD [facility] See Amion for pager  If no response to pager , please  call [phone] until 7pm After 7:00 pm call Elink  [phone] 09/01/2023, 9:53 AM

## 2023-09-01 NOTE — Progress Notes (Signed)
 eLink Physician-Brief Progress Note Patient Name: [name] DOB: Jul 14, 1981 MRN: [id number]   Date of Service  09/01/2023  HPI/Events of Note  Extubated today. BSRN concerned about swallowing  Patient reports abdominal pain and BSRN reports increased distension  eICU Interventions  Reviewed meds.  Change Brivaiact and BB to IV for tonight Swallow assessment tomorrow  KUB ordered     Intervention Category Minor Interventions: Routine modifications to care plan (e.g. PRN medications for pain, fever)  [name] [name] 09/01/2023, 10:43 PM

## 2023-09-01 NOTE — Progress Notes (Signed)
 Initial Nutrition Assessment  DOCUMENTATION CODES:  Obesity unspecified  INTERVENTION:  TF via OGT: Transition to Osmolite 1.2 at 20 ml/h and advance by 10ml q8h to goal rate of 45ml/hr (1440 ml per day) Provides 1728 kcal, 80 gm protein, 1181 ml free water daily  NUTRITION DIAGNOSIS:  Inadequate oral intake related to acute illness as evidenced by NPO status.  GOAL:  Patient will meet greater than or equal to 90% of their needs  MONITOR:  Vent status, Labs, Weight trends, TF tolerance, I & O's  REASON FOR ASSESSMENT:  Consult Enteral/tube feeding initiation and management (Trickle feeds)  ASSESSMENT:  Pt admitted with acute hypoxemic respiratory failure secondary to PNA and CP secondary to NSTEMI. PMH significant for seizures, L lobe encephalocele with resection at [facility], chronic hyponatremia.  2/7 admitted; intubated  Patient is currently intubated on ventilator support MV: 12.2 L/min Temp (24hrs), Avg:99.3 F (37.4 C), Min:98 F (36.7 C), Max:99.7 F (37.6 C)  Pt tearful and trying to pull off hand mits at time of visit.  No family present. Unable to obtain detailed nutrition related history at this time.   Currently tolerating Vital High Protein TF via OGT.  Will adjust to better meet nutrition needs.   Admit weight: 94 kg Current weight: 95.8 kg (MD believes to be euvolemic) Review of chart reflects pt's weight to have been increased 20.5 kg over the last year.   Drains/lines: UOP: x24 hours OGT (tip in gastric cardia)  Medications: colace BID, pepcid , lasix  40mg  q12h, SSI 0-15 units q4h, solu-medrol , miralax  Drips: Abx Propofol  @ 36.49ml/hr (provides 970kcal/d at current rate)  Labs:  Sodium 134 CBG's 108-149 x24 hours  NUTRITION - FOCUSED PHYSICAL EXAM: Flowsheet Row Most Recent Value  Orbital Region No depletion  Upper Arm Region No depletion  Thoracic and Lumbar Region No depletion  Buccal Region Unable to assess  Temple Region No  depletion  Clavicle Bone Region No depletion  Clavicle and Acromion Bone Region No depletion  Scapular Bone Region No depletion  Dorsal Hand Unable to assess  [in hand mits]  Patellar Region No depletion  Anterior Thigh Region No depletion  Posterior Calf Region No depletion  Edema (RD Assessment) None  Hair Reviewed  Eyes Reviewed  Mouth Unable to assess  Skin Reviewed  Nails Unable to assess   Diet Order:   Diet Order             Diet NPO time specified  Diet effective now                   EDUCATION NEEDS:   No education needs have been identified at this time  Skin:  Skin Assessment: Reviewed RN Assessment  Last BM:  unknown/PTA  Height:  Ht Readings from Last 1 Encounters:  08/29/23 5\' 1"  (1.549 m)    Weight:  Wt Readings from Last 1 Encounters:  08/31/23 95.8 kg    Ideal Body Weight:  47.7 kg  BMI:  Body mass index is 39.91 kg/m.  Estimated Nutritional Needs:   Kcal:  1600-1800  Protein:  80-95g  Fluid:  >/=1.6L  [name], RDN, LDN Clinical Nutrition

## 2023-09-01 NOTE — TOC CM/SW Note (Signed)
 Transition of Care [facility]) - Inpatient Brief Assessment   Patient Details  Name: [name] MRN: [id number] Date of Birth: [birth year]  Transition of [facility] CM/SW Contact:    Tom-Johnson, Angelique Ken, RN Phone Number: 09/01/2023, 3:13 PM   Clinical Narrative:  Patient presented to [facility] with Shortness Of Breath, Fatigue, Chills, Chest Pain and Dry Cough. Patient was started on Heparin  IV for NSTEMI. Patient then Intubated for Respiratory Distress and transferred to [facility] for further Management.  Patient is currently Intubated and Sedated. On IV abx. Cardiology following.   Patient not Medically ready for discharge.  CM will continue to follow as patient progresses with care towards discharge.             Transition of Care Asessment:

## 2023-09-01 NOTE — Progress Notes (Signed)
 eLink Physician-Brief Progress Note Patient Name: [name] DOB: 01-07-1981 MRN: [id number]   Date of Service  09/01/2023  HPI/Events of Note  43 year old female with pertinent PMH seizures secondary to prior left temporal lobe encephalocele s/p resection at [facility] 07/2022, chronic hyponatremia, tobacco abuse (0.5 ppd daily), smokes marijuana, and depression presents to [facility] ED on 2/7 with SOB, CP, chills, fatigue, and dry cough for 2-3 days   Mild ventilator dyssynchrony noted.  Seems to concur with external agitation abdomen.  Minimal air leak.  eICU Interventions  Switched to auto mode PCV.  Patient tolerating ventilation without difficulties.  No obvious dyssynchrony remaining.     Intervention Category Intermediate Interventions: Respiratory distress - evaluation and management  [name] 09/01/2023, 12:17 AM

## 2023-09-02 ENCOUNTER — Inpatient Hospital Stay (HOSPITAL_COMMUNITY): Payer: 59

## 2023-09-02 DIAGNOSIS — J9601 Acute respiratory failure with hypoxia: Secondary | ICD-10-CM | POA: Diagnosis not present

## 2023-09-02 DIAGNOSIS — J189 Pneumonia, unspecified organism: Secondary | ICD-10-CM | POA: Diagnosis not present

## 2023-09-02 LAB — CBC
HCT: 33.9 % — ABNORMAL LOW (ref 36.0–46.0)
Hemoglobin: 11.2 g/dL — ABNORMAL LOW (ref 12.0–15.0)
MCH: 30.4 pg (ref 26.0–34.0)
MCHC: 33 g/dL (ref 30.0–36.0)
MCV: 92.1 fL (ref 80.0–100.0)
Platelets: 448 10*3/uL — ABNORMAL HIGH (ref 150–400)
RBC: 3.68 MIL/uL — ABNORMAL LOW (ref 3.87–5.11)
RDW: 15.9 % — ABNORMAL HIGH (ref 11.5–15.5)
WBC: 8.1 10*3/uL (ref 4.0–10.5)
nRBC: 0 % (ref 0.0–0.2)

## 2023-09-02 LAB — TRIGLYCERIDES: Triglycerides: 89 mg/dL (ref <150)

## 2023-09-02 LAB — GLUCOSE, CAPILLARY
Glucose-Capillary: 123 mg/dL — ABNORMAL HIGH (ref 70–99)
Glucose-Capillary: 107 mg/dL — ABNORMAL HIGH (ref 70–99)
Glucose-Capillary: 126 mg/dL — ABNORMAL HIGH (ref 70–99)
Glucose-Capillary: 106 mg/dL — ABNORMAL HIGH (ref 70–99)
Glucose-Capillary: 110 mg/dL — ABNORMAL HIGH (ref 70–99)

## 2023-09-02 LAB — BASIC METABOLIC PANEL
Anion gap: 16 — ABNORMAL HIGH (ref 5–15)
BUN: 15 mg/dL (ref 6–20)
CO2: 20 mmol/L — ABNORMAL LOW (ref 22–32)
Calcium: 9.3 mg/dL (ref 8.9–10.3)
Chloride: 106 mmol/L (ref 98–111)
Creatinine, Ser: 0.77 mg/dL (ref 0.44–1.00)
GFR, Estimated: >60 mL/min (ref >60)
Glucose, Bld: 121 mg/dL — ABNORMAL HIGH (ref 70–99)
Potassium: 4.1 mmol/L (ref 3.5–5.1)
Sodium: 142 mmol/L (ref 135–145)

## 2023-09-02 LAB — CULTURE, RESPIRATORY W GRAM STAIN: Gram Stain: NONE SEEN

## 2023-09-02 LAB — BRAIN NATRIURETIC PEPTIDE: B Natriuretic Peptide: 59.3 pg/mL (ref 0.0–100.0)

## 2023-09-02 MED ORDER — ACETAMINOPHEN 650 MG RE SUPP: 650.0000 mg | Freq: Four times a day (QID) | RECTAL | Status: DC | PRN

## 2023-09-02 MED ORDER — NORTRIPTYLINE HCL 25 MG PO CAPS
25.0000 mg | ORAL_CAPSULE | Freq: Every day | ORAL | Status: DC
  Administered 2023-09-02 – 2023-09-04 (×3): 25 mg via ORAL
  Filled 2023-09-02 (×3): qty 1

## 2023-09-02 MED ORDER — ONDANSETRON HCL 4 MG/2ML IJ SOLN: 4.0000 mg | Freq: Four times a day (QID) | INTRAMUSCULAR | Status: DC | PRN

## 2023-09-02 MED ORDER — ENSURE ENLIVE PO LIQD
237.0000 mL | Freq: Two times a day (BID) | ORAL | Status: DC
  Administered 2023-09-02 – 2023-09-04 (×4): 237 mL via ORAL

## 2023-09-02 MED ORDER — ACETAMINOPHEN 325 MG PO TABS
650.0000 mg | ORAL_TABLET | Freq: Four times a day (QID) | ORAL | Status: DC | PRN
  Administered 2023-09-02: 650 mg via ORAL
  Filled 2023-09-02: qty 2

## 2023-09-02 MED ORDER — BRIVARACETAM 100 MG PO TABS
100.0000 mg | ORAL_TABLET | Freq: Two times a day (BID) | ORAL | Status: DC
  Administered 2023-09-02 – 2023-09-05 (×7): 100 mg via ORAL
  Filled 2023-09-02 (×7): qty 1

## 2023-09-02 MED ORDER — SUMATRIPTAN SUCCINATE 50 MG PO TABS
50.0000 mg | ORAL_TABLET | Freq: Once | ORAL | Status: DC
  Filled 2023-09-02 (×3): qty 1

## 2023-09-02 MED ORDER — FUROSEMIDE 10 MG/ML IJ SOLN
40.0000 mg | Freq: Every day | INTRAMUSCULAR | Status: AC
  Administered 2023-09-02: 40 mg via INTRAVENOUS
  Filled 2023-09-02: qty 4

## 2023-09-02 MED ORDER — IBUPROFEN 200 MG PO TABS
400.0000 mg | ORAL_TABLET | Freq: Four times a day (QID) | ORAL | Status: DC | PRN
  Administered 2023-09-02: 400 mg via ORAL
  Filled 2023-09-02: qty 2

## 2023-09-02 MED ORDER — ASPIRIN 81 MG PO CHEW
81.0000 mg | CHEWABLE_TABLET | Freq: Every day | ORAL | Status: DC
  Administered 2023-09-02 – 2023-09-05 (×4): 81 mg via ORAL
  Filled 2023-09-02 (×4): qty 1

## 2023-09-02 MED ORDER — OXCARBAZEPINE 300 MG PO TABS
900.0000 mg | ORAL_TABLET | Freq: Every evening | ORAL | Status: DC
  Administered 2023-09-02 – 2023-09-04 (×3): 900 mg via ORAL
  Filled 2023-09-02 (×3): qty 3

## 2023-09-02 MED ORDER — OXCARBAZEPINE 300 MG PO TABS
600.0000 mg | ORAL_TABLET | Freq: Every morning | ORAL | Status: DC
  Administered 2023-09-02 – 2023-09-05 (×4): 600 mg via ORAL
  Filled 2023-09-02 (×4): qty 2

## 2023-09-02 MED ORDER — METHYLPREDNISOLONE SODIUM SUCC 125 MG IJ SOLR
60.0000 mg | Freq: Every day | INTRAMUSCULAR | Status: DC
  Administered 2023-09-02 – 2023-09-03 (×2): 60 mg via INTRAVENOUS
  Filled 2023-09-02 (×2): qty 2

## 2023-09-02 MED ORDER — ATORVASTATIN CALCIUM 40 MG PO TABS
40.0000 mg | ORAL_TABLET | Freq: Every day | ORAL | Status: DC
  Administered 2023-09-02 – 2023-09-05 (×4): 40 mg via ORAL
  Filled 2023-09-02 (×4): qty 1

## 2023-09-02 MED ORDER — BUSPIRONE HCL 5 MG PO TABS
5.0000 mg | ORAL_TABLET | Freq: Two times a day (BID) | ORAL | Status: DC
  Administered 2023-09-02 – 2023-09-05 (×7): 5 mg via ORAL
  Filled 2023-09-02 (×7): qty 1

## 2023-09-02 MED ORDER — SENNA 8.6 MG PO TABS
1.0000 | ORAL_TABLET | Freq: Two times a day (BID) | ORAL | Status: DC
  Administered 2023-09-02 (×2): 8.6 mg via ORAL
  Filled 2023-09-02 (×5): qty 1

## 2023-09-02 MED ORDER — DOCUSATE SODIUM 100 MG PO CAPS
100.0000 mg | ORAL_CAPSULE | Freq: Two times a day (BID) | ORAL | Status: DC
Start: 2023-09-02 — End: 2023-09-05
  Administered 2023-09-02 (×2): 100 mg via ORAL
  Filled 2023-09-02 (×5): qty 1

## 2023-09-02 MED ORDER — ADULT MULTIVITAMIN W/MINERALS CH
1.0000 | ORAL_TABLET | Freq: Every day | ORAL | Status: DC
  Administered 2023-09-02 – 2023-09-05 (×4): 1 via ORAL
  Filled 2023-09-02 (×4): qty 1

## 2023-09-02 MED ORDER — ONDANSETRON HCL 4 MG PO TABS: 4.0000 mg | ORAL_TABLET | Freq: Four times a day (QID) | ORAL | Status: DC | PRN

## 2023-09-02 NOTE — Progress Notes (Signed)
Nutrition Follow-up  DOCUMENTATION CODES:   Obesity unspecified  INTERVENTION:  Continue regular diet as ordered Ensure Enlive po BID, each supplement provides 350 kcal and 20 grams of protein. MVI with minerals daily "General, Healthful Nutrition Therapy" handout added to AVS for reference once discharged  NUTRITION DIAGNOSIS:   Inadequate oral intake related to acute illness as evidenced by NPO status. - progressing, extubated and diet advanced  GOAL:   Patient will meet greater than or equal to 90% of their needs - not met, diet advanced this morning  MONITOR:   Vent status, Labs, Weight trends, TF tolerance, I & O's  REASON FOR ASSESSMENT:   Consult Enteral/tube feeding initiation and management (Trickle feeds)  ASSESSMENT:   Pt admitted with acute hypoxemic respiratory failure secondary to PNA and CP secondary to NSTEMI. PMH significant for seizures, L lobe encephalocele with resection at [facility], chronic hyponatremia.  2/7: admitted, intubated 2/10: extubated 2/11: clinical/bed side swallow evaluation- functional oropharyngeal swallow; no indication of dysphagia  Brief visit with patient at bedside as pt initially on a phone call and then working with mobility to get up out of bed. She states that at home she only eats 1-2 meals daily as she has been trying to lose weight. Pt states "not much" when asked what she is eating for these meals.  Today following diet advancement, she had received lunch though reports that she did not eat a lot as the meat was too tough. Pt is agreeable to nutrition supplements to optimize nutritional intake during admission given acute illness and likely inadequate PO intake.   Admit weight: 94 kg Last measured weight (2/9): 95.8 kg  Medications: colace BID, SSI 0-15 units q4h, solu-medrol, miralax, senna BID  Labs:  Anion gap 16 CBG's 95-126 x24 hours  Diet Order:   Diet Order             Diet regular Room service appropriate? Yes  with Assist; Fluid consistency: Thin  Diet effective now                   EDUCATION NEEDS:   No education needs have been identified at this time  Skin:  Skin Assessment: Reviewed RN Assessment  Last BM:  2/10 type 7 medium  Height:   Ht Readings from Last 1 Encounters:  08/29/23 5\' 1"  (1.549 m)    Weight:   Wt Readings from Last 1 Encounters:  08/31/23 95.8 kg    Ideal Body Weight:  47.7 kg  BMI:  Body mass index is 39.91 kg/m.  Estimated Nutritional Needs:   Kcal:  1600-1800  Protein:  80-95g  Fluid:  >/=1.6L  Drusilla Kanner, RDN, LDN Clinical Nutrition

## 2023-09-02 NOTE — Progress Notes (Addendum)
NAME:  [name], MRN:  [id number], DOB:  07-10-1981, LOS: 4 ADMISSION DATE:  08/29/2023, CONSULTATION DATE:  2/8 REFERRING MD:  Dr. Thomes Dinning, CHIEF COMPLAINT:  respiratory failure   History of Present Illness:   A 43 year old female with pertinent PMH seizures secondary to prior left temporal lobe encephalocele s/p resection at [facility] 07/2022, chronic hyponatremia, tobacco abuse (0.5 ppd daily), smokes marijuana, and depression presents to [facility] ED on 2/7 with SOB, CP, chills, fatigue, and dry cough for 2-3 days without f/c/r, N/V/D, abd pain, rash, wheezing, or recent traveling. No alcohol or illicit drug use. Patient began having some pleuritic chest pain and came to [facility] ED on 2/7 for further eval.  On arrival patient was sinus tachycardia 110s and tachypneic with RR in 30-40s.  Initial temp 99 F.  Patient sats improved on [place].  CXR with cardiomegaly and diffuse pulmonary  opacity likely pulmonary edema less likely pneumonia.  Given Lasix.  COVID, flu, RSV negative.   D-dimer 1.99, BNP 171, troponin 225 then 210.  EKG with no ST elevation; anterolateral T wave inversions.  CTA chest with no PE; diffuse groundglass densities concerning for pneumonia.  WBC 14.1 and PCT 0.44.  Cultures obtained and placed on Rocephin and azithromycin for possible CAP.  Cardiology consulted.  Stat echo showing LVEF 65 to 70%; difficult to assess wall motion abnormality; mild diastolic dysfunction.  Patient started on heparin IV for NSTEMI.  Later in evening patient with worsening respiratory distress and placed on BiPAP.  ABG 7.4, 28, 140, 17. Given albuterol neb and started on IV steroids.  Given repeat Lasix.  Patient still tachypneic with RR in 50s. Patient intubated and placed on vent.  PCCM consulted for ICU transfer to [facility].  Pertinent  Medical History   Past Medical History:  Diagnosis Date   Back pain    Chronic headaches    Depression    Fall 03/07/2021   Genital warts    herpes   Herpes    Lip injury  01/25/2021   Lower lip trauma on 01/19/2021 during seizure, treated at [facility] urgent care   Seizures [facility])    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 ([facility])   Vision loss, bilateral 05/08/2021    Significant Hospital Events: Including procedures, antibiotic start and stop dates in addition to other pertinent events   2/7 admitted to [facility] with chest pain and SOB Intubated  2/8 now at [facility].  Continuing antibiotics, IV diuresis, systemic steroids, following up on cultures remains on heparin. RVP positive for rhinovirus, ustrep neg, MRSA PCR pos. Sig bronchospasm. BDs and ICS optimized.  2/9 still having some vent synchrony difficulty. Increased prop ceiling to 75. Changed vent to PCV seems to be more comfortable. Metabolic acidosis improved. Still w/ sig wheeze. Change steroids to 40mg  bid   Interim History / Subjective:   Extubated yesterday. Complains of abdominal pain overnight  Objective   Blood pressure (!) 143/91, pulse 90, temperature 97.9 F (36.6 C), temperature source Oral, resp. rate (!) 28, height 5\' 1"  (1.549 m), weight 95.8 kg, SpO2 96%.    Vent Mode: PSV;CPAP FiO2 (%):  [40 %] 40 % PEEP:  [5 cmH20] 5 cmH20 Pressure Support:  [8 cmH20] 8 cmH20 Plateau Pressure:  [31 cmH20] 31 cmH20   Intake/Output Summary (Last 24 hours) at 09/02/2023 0851 Last data filed at 09/02/2023 0800 Gross per 24 hour  Intake 887.86 ml  Output 3550 ml  Net -  2662.14 ml   Filed Weights   08/29/23 1000 08/31/23 0411  Weight: 94 kg 95.8 kg    Examination: Gen:      No acute distress HEENT:  EOMI, sclera anicteric Neck:     No masses; no thyromegaly Lungs:    Mild expiratory wheeze improved CV:         Regular rate and rhythm; no murmurs Abd:      + bowel sounds; soft, non-tender; no palpable masses, no distension Ext:    No edema; adequate peripheral perfusion Skin:      Warm and dry; no rash Neuro:  Sedated   Lab/imaging  reviewed Significant for sodium 135, BUN/creatinine 13/0.63 WBC 10.7, hemoglobin 10.2, platelets 413 Respiratory virus panel positive for rhinovirus Chest x-ray with pulmonary vascular congestion, bilateral airspace opacities.  Resolved Hospital Problem list   Metabolic acidosis / NAGMA & also mod lactic acidosis  and rising cr   Assessment & Plan:   Acute respiratory failure with hypoxia 2/2 Rhinovirus viral PNA and CAP (NOS) w/ ALI  and complicated by on-going bronchospasm seemingly more PNA than edema given limited response to lasix  Plan Stable postextubation.  Wean down FiO2 as tolerated Continue ceftriaxone, azithromycin for 5 days Change steroids to 60 mg/day prednisone Continue bronchodilators  NSTEMI Trops down trending Plan: Received 48 hours of IV heparin VT asa and statin Metoprolol 12.5 mg VT BID She has responded well to Lasix yesterday.  Will repeat 40 mg once today IV Cards following  Abdominal pain X-ray with nonspecific bowel gas pattern, probable ileus.  She is constipated Will try starting p.o. diet.  Start bowel regimen  Hx of epilepsy: secondary to prior left temporal lobe encephalocele s/p resection at [facility] 07/2022 Plan: Continue briviact, zonisamide, and trileptal  Chronic hyponatremia: baseline around 124, I suspect this is secondary to some of her antilipid medications, most recently sodium 142 Plan: Cont to monitor   Glycemic control Plan Ssi goal 140-180  Tobacco abuse Plan: cessation counseling when appropriate   Anxiety/depression Plan: Cont  buspar and nortriptyline VT  Hx of migraines Plan: Hold home emgality and ubrelvy  Stable for transfer out of ICU and to hospitalist service  Best Practice (right click and "Reselect all SmartList Selections" daily)   Diet/type: tubefeeds DVT prophylaxis systemic heparin Pressure ulcer(s): N/A GI prophylaxis: H2B Lines: N/A Foley:  N/A Code Status:  full code Last date of  multidisciplinary goals of care discussion [pending]  Critical care time:  NA   Chilton Greathouse MD [place] [facility] See Amion for pager  If no response to pager , please call [phone] until 7pm After 7:00 pm call Elink  [phone] 09/02/2023, 9:08 AM

## 2023-09-02 NOTE — Progress Notes (Signed)
Patient arrived to unit on bed, pt had portable O2 tank and she was placed on wall oxygen and 4L via nasal cannula pt VS taken, pt oriented to room, call the use of call bell, bed placed in low position and locked wheels, call bell within reach. Continous pulse ox placed on pt, yonker on and present at bedside, seizure pads on bed.

## 2023-09-02 NOTE — Telephone Encounter (Signed)
Pt has been admitted since 2/7

## 2023-09-02 NOTE — Evaluation (Signed)
Physical Therapy Evaluation Patient Details Name: [name] MRN: [id number] DOB: [birth year] Today's Date: 09/02/2023  History of Present Illness  43 y.o. female presents to [facility] hospital on 08/29/2023 as a transfer from [place] due to respiratory distress, intubated on 2/7. Pt found to have rhinovirus, also with concern for NSTEMI. Extubated 2/10. 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  Clinical Impression  Pt presents to PT with deficits in functional mobility, gait, balance, strength, power, cognition. No family are present to confirm baseline however pt does appear to have impaired memory, attention and processing throughout session. Pt is generally weak and requires assistance for stability when transferring at this time, ambulation deferred due to desaturation and fatigue after transfer. PT will attempt to see this patient frequently in an effort to improve balance and endurance. PT recommends discharge home with HHPT pending further improvement.         If plan is discharge home, recommend the following: A little help with walking and/or transfers;A lot of help with bathing/dressing/bathroom;Assistance with cooking/housework;Direct supervision/assist for medications management;Direct supervision/assist for financial management;Assist for transportation;Help with stairs or ramp for entrance;Supervision due to cognitive status   Can travel by private vehicle        Equipment Recommendations  (TBD, may require RW pending progress)  Recommendations for Other Services       Functional Status Assessment Patient has had a recent decline in their functional status and demonstrates the ability to make significant improvements in function in a reasonable and predictable amount of time.     Precautions / Restrictions Precautions Precautions: Fall Recall of Precautions/Restrictions:  Impaired Restrictions Weight Bearing Restrictions Per Provider Order: No      Mobility  Bed Mobility Overal bed mobility: Needs Assistance Bed Mobility: Supine to Sit     Supine to sit: Supervision, HOB elevated          Transfers Overall transfer level: Needs assistance Equipment used: 1 person hand held assist Transfers: Sit to/from Stand, Bed to chair/wheelchair/BSC Sit to Stand: Contact guard assist   Step pivot transfers: Min assist            Ambulation/Gait Ambulation/Gait assistance:  (deferred due to fatigue and desaturation when on 4L [place])                Stairs            Wheelchair Mobility     Tilt Bed    Modified Rankin (Stroke Patients Only)       Balance Overall balance assessment: Needs assistance Sitting-balance support: No upper extremity supported, Feet supported Sitting balance-Leahy Scale: Fair     Standing balance support: Single extremity supported, Reliant on assistive device for balance Standing balance-Leahy Scale: Poor                               Pertinent Vitals/Pain Pain Assessment Pain Assessment: No/denies pain    Home Living Family/patient expects to be discharged to:: Private residence Living Arrangements: Alone Available Help at Discharge: Family;Available 24 hours/day (pt reports 24/7 between her mom and brother (brother is also her aide)) Type of Home: Apartment Home Access: Stairs to enter Entrance Stairs-Rails: Can reach both Entrance Stairs-Number of Steps: 6   Home Layout: One level Home Equipment: BSC/3in1;Shower seat;Cane - single point      Prior Function Prior Level of Function : Needs  assist             Mobility Comments: ambulatory with support of SPC ADLs Comments: son assists with IADLs, family provides transport     Extremity/Trunk Assessment   Upper Extremity Assessment Upper Extremity Assessment: Generalized weakness    Lower Extremity  Assessment Lower Extremity Assessment: Generalized weakness    Cervical / Trunk Assessment Cervical / Trunk Assessment: Normal  Communication   Communication Communication: No apparent difficulties    Cognition Arousal: Alert Behavior During Therapy: WFL for tasks assessed/performed   PT - Cognitive impairments: No family/caregiver present to determine baseline, Orientation, Awareness, Attention, Problem solving, Safety/Judgement   Orientation impairments: Time                     Following commands: Intact       Cueing Cueing Techniques: Verbal cues     General Comments General comments (skin integrity, edema, etc.): pt on 4L [place] initially, PT weans to 2L [place] at rest with sats in mid 90s. With mobility the pt desats to the mid 80s on 2L, later desats to high 80s when transferring on 4L but recovers when resting    Exercises     Assessment/Plan    PT Assessment Patient needs continued PT services  PT Problem List Decreased strength;Decreased balance;Decreased activity tolerance;Decreased mobility;Decreased cognition;Decreased knowledge of use of DME;Decreased safety awareness;Decreased knowledge of precautions;Cardiopulmonary status limiting activity       PT Treatment Interventions DME instruction;Gait training;Stair training;Functional mobility training;Therapeutic activities;Therapeutic exercise;Balance training;Neuromuscular re-education;Cognitive remediation;Patient/family education    PT Goals (Current goals can be found in the Care Plan section)  Acute Rehab PT Goals Patient Stated Goal: to return home PT Goal Formulation: With patient Time For Goal Achievement: 09/16/23 Potential to Achieve Goals: Good    Frequency Min 1X/week     Co-evaluation               AM-PAC PT "6 Clicks" Mobility  Outcome Measure Help needed turning from your back to your side while in a flat bed without using bedrails?: A Little Help needed moving from lying on your  back to sitting on the side of a flat bed without using bedrails?: A Little Help needed moving to and from a bed to a chair (including a wheelchair)?: A Little Help needed standing up from a chair using your arms (e.g., wheelchair or bedside chair)?: A Little Help needed to walk in hospital room?: Total Help needed climbing 3-5 steps with a railing? : Total 6 Click Score: 14    End of Session Equipment Utilized During Treatment: Oxygen Activity Tolerance: Patient limited by fatigue Patient left: in chair;with call bell/phone within reach;with chair alarm set Nurse Communication: Mobility status PT Visit Diagnosis: Other abnormalities of gait and mobility (R26.89);Muscle weakness (generalized) (M62.81)    Time: 1610-9604 PT Time Calculation (min) (ACUTE ONLY): 25 min   Charges:   PT Evaluation $PT Eval Low Complexity: 1 Low   PT General Charges $$ ACUTE PT VISIT: 1 Visit         Arlyss Gandy, PT, DPT Acute Rehabilitation Office [phone]   Arlyss Gandy 09/02/2023, 10:15 AM

## 2023-09-02 NOTE — Progress Notes (Signed)
Mobility Specialist Progress Note:    09/02/23 1400  Mobility  Activity Transferred from bed to chair  Level of Assistance Minimal assist, patient does 75% or more  Assistive Device Front wheel walker  Distance Ambulated (ft) 6 ft  Activity Response Tolerated well  Mobility Referral Yes  Mobility visit 1 Mobility  Mobility Specialist Start Time (ACUTE ONLY) 1420  Mobility Specialist Stop Time (ACUTE ONLY) 1438  Mobility Specialist Time Calculation (min) (ACUTE ONLY) 18 min   Pt received in bed and agreeable. Required minA d/t unsteadiness when ambulating. C/o headache throughout. Pt left in chair with call bell and all needs met. Chair alarm on.  D'[place]ante Earlene Plater Mobility Specialist Please contact via Special educational needs teacher or Rehab office at [phone]

## 2023-09-02 NOTE — Discharge Instructions (Addendum)
General, Healthful Nutrition Therapy  This handout provides you with the information you'll need to follow a general, healthful diet, which can be tailored to your personal preferences. There are several benefits to following a general, healthful diet: It could mean less calories, less salt, less added sugars, and less saturated fat than many other diets. This outcome will depend on the foods you choose. Eating more whole grains, beans, lentils, fruits, vegetables, nuts, and seeds may improve how much fiber, vitamins, and minerals you eat.  It can lower your risk for health conditions like diabetes, heart disease, hypertension, stroke, and cancer. Your registered dietitian nutritionist (RDN) may recommend portion sizes based on your individual needs and personal and cultural preferences.  Tips Every day, eat a variety of fruits and vegetables in a variety of colors. Be sure to include lots of dark green, red, blue-purple, and orange vegetables. Choose whole grains for at least half of your grain selections. Eat more beans, peas, and lentils. Try meatless alternatives. Get protein in your diet from eggs, fish, poultry, beans, peas, lentils, and nuts/nut butters. Low-fat or fat-free dairy products are also good sources of protein. Keep your salt intake to a minimum (less than 2300 milligrams per day). Limit use of salt, soy sauce, or fish sauce when cooking. Eat freshly prepared meals at home. Processed, prepackaged, and restaurant foods contain more salt. Choose fresh fruits and vegetables for snacks. Choose products with lower sodium content when grocery shopping. Limit your daily sugar intake. Sugar may be used in sauces, marinades, dressings, and condiments - even those that do not taste sweet.  Sugar can be found in honey, syrups, jelly, fruit juice, and fruit juice concentrate. Limit sugar-sweetened beverages like sodas and fruit juice, sugary snacks, and candy. It's best to choose  products without added sugar, but if you do eat them, read labels carefully so you know how much sugar is in each portion. It is better to eat unsaturated fats than saturated fats. Use fats and oils in moderation, up to 5 servings per day. Unsaturated fat is found in fish, avocado, nuts, and oils like sunflower, canola, avocado and olive oils. Saturated fat is found in fatty meat, butter, ice cream, palm and coconut oil, cream, cheese, and lard. Many processed foods, fried foods, fast food items, convenience foods like frozen pizza and snack foods, and sweets including pies, cookies, and other pastries are high in fat. Check nutrition labels and choose these foods less often. Use vegetable oil instead of lard or butter for cooking. Boil, steam, or bake your food instead of deep frying in oil. Remove the fatty part of meats before cooking.  Foods to Choose or to Limit Choose a healthful balance of foods from each food group at your meals. Your RDN may make individualized portion size recommendations based on your needs. Food Group Foods to Choose Foods to Limit  Grains Whole wheat, barley, rye, buckwheat, corn, teff, quinoa, millet, amaranth, brown and wild rice, sorghum, and oats; focus on intact cooked whole grains Grain products, such as bread, rolls, prepared breakfast cereals, crackers, and pasta made from whole grains that are low in added sugars, saturated fat, and sodium Sweetened, low-fiber breakfast cereals Packaged (high sugar, refined ingredients) baked goods Snack crackers and chips made of refined ingredients, cheese crackers, butter crackers Breads made with refined ingredients and saturated fats, such as biscuits, frozen waffles, sweet breads, doughnuts, pastries, packaged baking mixes, pancakes, cakes, and cookies  Protein Foods Meat, including lean, trimmed cuts of  beef, pork, or lamb a few times per week or less Poultry, including skinless chicken or [place] Seafood, including  fish, shrimp, lobster, clams, and scallops at least twice per week. Focus on fatty fish, such as salmon, herring, and sardines, as a rich source of omega-3 fatty acids Eggs Nuts and seeds, such as peanuts, almonds, pistachios, and sunflower seeds (unsalted varieties) Nut and seed butters, such as peanut butter, almond butter, and sunflower seed butter (reduced-sodium varieties)  Soy foods such as tofu, tempeh, or soy nuts Plant protein-based meat alternatives, such as veggie burgers and sausages (reduced-sodium varieties) Unsalted beans, lentils, or peas at least a few times per week in place of other protein sources Marbled or fatty red meats (beef, pork, lamb), such as ribs Processed red meats, such as bacon, sausage, and ham Poultry (chicken and [place]) with skin Fried meats, poultry, or fish [employer], shark, and [place] (may contain high levels of mercury) Deli meats, such as pastrami, bologna, or salami Fried eggs Salted beans, peas, lentils, nuts, seeds, or nut/seed butters Meat alternatives with high levels of sodium or saturated fat  Dairy and Dairy Alternatives Low-fat or fat-free milk, yogurt (low in added sugars), cottage cheese, and cheeses Fortified soymilk or soy yogurt Whole milk, cream, cheeses made from whole milk, sour cream Yogurt or ice cream made from whole milk or with added sugar Cream cheese made from whole milk  Vegetables A variety of vegetables, including dark-green, red, blue-purple, and orange vegetables Low-sodium vegetable juices Canned or frozen vegetables with salt, fresh vegetables prepared with salt Fried vegetables Vegetables in cream sauce or cheese sauce Tomato or pasta sauce with high levels of salt or sugar  Fruit A variety of whole fruits, canned fruit packed in water, or dried fruit 100% fruit juice (limited to  cup per day) Fruits packed in syrup or made with added sugar  Fats and Oils Unsaturated vegetable oils, including olive, peanut, and  canola oils Vegetable oil-based margarines and spreads Salad dressing and mayonnaise made from unsaturated vegetable oils Solid shortening or partially hydrogenated oils Solid margarine made with hydrogenated or partially hydrogenated oils Butter  Beverages Coffee and tea (unsweetened) Water Sweetened drinks, including sweetened coffee or tea drinks, soda, energy drinks, and sports drinks  Other Prepared foods, including soups, casseroles, salads, baked goods, and snacks made from recommended ingredients, with low levels of added saturated fat, added sugars, or salt Sugary and/or fatty desserts, candy, and other sweets; salt and seasonings that contain salt Fried foods   General, Healthful Diet Sample 1-Day Menu View Nutrient Info Breakfast 1 cup oatmeal  cup blueberries 1 ounce almonds 1 cup 1% milk or fortified soymilk 1 cup unsweetened coffee  Lunch 2 slices whole wheat bread 3 ounces [place] slices 2 lettuce leaves 2 slices tomato 1 ounce reduced-fat, reduced sodium cheese  cup carrot sticks  cup hummus 1 banana 1 cup 1% milk or fortified soymilk 1 cup unsweetened tea  Evening Meal 4 ounces salmon, baked  cup cooked brown rice 1 cup green beans, cooked 1 cup mixed greens salad 1 teaspoon olive oil mixed with vinegar of choice 1 whole wheat dinner roll 1 teaspoon margarine, soft, tub (for roll) 1 cup water  Evening Snack 1 cup low-fat yogurt  cup sliced peaches

## 2023-09-02 NOTE — Evaluation (Signed)
Clinical/Bedside Swallow Evaluation Patient Details  Name: [name] MRN: [id number] Date of Birth: 10/14/1980  Today's Date: 09/02/2023 Time: SLP Start Time (ACUTE ONLY): 1122 SLP Stop Time (ACUTE ONLY): 1136 SLP Time Calculation (min) (ACUTE ONLY): 14 min  Past Medical History:  Past Medical History:  Diagnosis Date   Back pain    Chronic headaches    Depression    Fall 03/07/2021   Genital warts    herpes   Herpes    Lip injury 01/25/2021   Lower lip trauma on 01/19/2021 during seizure, treated at [facility] urgent care   Seizures [facility])    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 ([facility])   Vision loss, bilateral 05/08/2021   Past Surgical History:  Past Surgical History:  Procedure Laterality Date   BRAIN SURGERY  07/30/2022   CESAREAN SECTION     x 3   HPI:  43 y.o. female presents to [facility] hospital on 08/29/2023 as a transfer from [place] due to respiratory distress, intubated on 2/7. Pt found to have rhinovirus, also with concern for NSTEMI. Extubated 2/10. 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 Pt describes memory loss after surgery.    Assessment / Plan / Recommendation  Clinical Impression   Pt presents with functional oropharyngeal swallowing with no indication of dysphagia.  Oral mechanism exam is normal; dentition present. She consumed consecutive sips of thin liquids, ate applesauce and crackers with adequate oral attention, no oral residue post-swallow, no s/s of aspiration.  No concerns for a post-extubation dysphagia - airway protection appears to be normal. Vocal quality is clear with good volume.     Recommend continuing current regular diet; thin liquids; give meds whole in liquid.   Pt does report baseline memory deficits- uncertain degree of cognitive impairment PTA.  If there is a change from  baseline, cognitive/linguistic evaluation may be warranted.  No further SLP f/u for swallowing is needed.     SLP Visit Diagnosis: Dysphagia, unspecified (R13.10)    Aspiration Risk  No limitations    Diet Recommendation   Thin;Age appropriate regular  Medication Administration: Whole meds with liquid    Other  Recommendations Oral Care Recommendations: Oral care BID    Recommendations for follow up therapy are one component of a multi-disciplinary discharge planning process, led by the attending physician.  Recommendations may be updated based on patient status, additional functional criteria and insurance authorization.  Follow up Recommendations No SLP follow up        Swallow Study   General HPI: 43 y.o. female presents to [facility] hospital on 08/29/2023 as a transfer from [facility] due to respiratory distress, intubated on 2/7. Pt found to have rhinovirus, also with concern for NSTEMI. Extubated 2/10. 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 Pt describes memory loss after surgery. Type of Study: Bedside Swallow Evaluation Previous Swallow Assessment: no Diet Prior to this Study: Regular;Thin liquids (Level 0) Temperature Spikes Noted: No Respiratory Status: Nasal cannula History of Recent Intubation: Yes Total duration of intubation (days): 3 days Date extubated: 09/01/23 Behavior/Cognition: Alert;Cooperative;Pleasant mood Oral Cavity Assessment: Within Functional Limits Oral Care Completed by SLP: No Oral Cavity - Dentition: Adequate natural dentition Vision: Functional for self-feeding Self-Feeding Abilities: Able to feed self Patient Positioning: Upright in bed Baseline Vocal Quality: Normal Volitional Cough: Strong Volitional Swallow:  Able to elicit    Oral/Motor/Sensory Function Overall Oral Motor/Sensory Function: Within functional limits   Ice Chips Ice chips:  Within functional limits   Thin Liquid Thin Liquid: Within functional limits    Nectar Thick Nectar Thick Liquid: Not tested   Honey Thick Honey Thick Liquid: Not tested   Puree Puree: Within functional limits   Solid     Solid: Within functional limits      [name] 09/02/2023,12:25 PM  Elizabeth Folks L. Samson Frederic, MA CCC/SLP Clinical Specialist - Acute Care SLP Acute Rehabilitation Services Office number [phone]

## 2023-09-03 DIAGNOSIS — J9601 Acute respiratory failure with hypoxia: Secondary | ICD-10-CM | POA: Diagnosis not present

## 2023-09-03 LAB — CULTURE, BLOOD (ROUTINE X 2)
Culture: NO GROWTH
Culture: NO GROWTH

## 2023-09-03 LAB — CBC
HCT: 33.7 % — ABNORMAL LOW (ref 36.0–46.0)
Hemoglobin: 11.1 g/dL — ABNORMAL LOW (ref 12.0–15.0)
MCH: 30.5 pg (ref 26.0–34.0)
MCHC: 32.9 g/dL (ref 30.0–36.0)
MCV: 92.6 fL (ref 80.0–100.0)
Platelets: 465 10*3/uL — ABNORMAL HIGH (ref 150–400)
RBC: 3.64 MIL/uL — ABNORMAL LOW (ref 3.87–5.11)
RDW: 15.6 % — ABNORMAL HIGH (ref 11.5–15.5)
WBC: 8.2 10*3/uL (ref 4.0–10.5)
nRBC: 0 % (ref 0.0–0.2)

## 2023-09-03 LAB — BASIC METABOLIC PANEL
Anion gap: 12 (ref 5–15)
BUN: 12 mg/dL (ref 6–20)
CO2: 20 mmol/L — ABNORMAL LOW (ref 22–32)
Calcium: 9.1 mg/dL (ref 8.9–10.3)
Chloride: 104 mmol/L (ref 98–111)
Creatinine, Ser: 0.62 mg/dL (ref 0.44–1.00)
GFR, Estimated: >60 mL/min (ref >60)
Glucose, Bld: 113 mg/dL — ABNORMAL HIGH (ref 70–99)
Potassium: 4.2 mmol/L (ref 3.5–5.1)
Sodium: 136 mmol/L (ref 135–145)

## 2023-09-03 LAB — GLUCOSE, CAPILLARY
Glucose-Capillary: 110 mg/dL — ABNORMAL HIGH (ref 70–99)
Glucose-Capillary: 110 mg/dL — ABNORMAL HIGH (ref 70–99)
Glucose-Capillary: 85 mg/dL (ref 70–99)
Glucose-Capillary: 93 mg/dL (ref 70–99)
Glucose-Capillary: 97 mg/dL (ref 70–99)

## 2023-09-03 LAB — BRAIN NATRIURETIC PEPTIDE: B Natriuretic Peptide: 23.2 pg/mL (ref 0.0–100.0)

## 2023-09-03 NOTE — Progress Notes (Signed)
PROGRESS NOTE    [name]  ZOX:[id number] DOB: 1981-04-04 DOA: 08/29/2023 PCP: Kerri Perches, MD    Brief Narrative:  43 year old with history of seizures, left temporal lobe encephalocele s/p resection, chronic hyponatremia, smoker, marijuana use, depression who presented to [facility] on 2/7 with shortness of breath, chest pain chills fatigue and dry cough for 3 days.  In the emergency room she was tachycardic and tachypneic with respiratory rate of 40.  Initial temperature 99.  Initially on nasal cannula oxygen.  COVID flu and RSV negative.  Mildly elevated troponins.  EKG nonischemic.  CTA chest with no PE, diffuse groundglass densities concerning for pneumonia.  WBC 14.  Procalcitonin 0.44.  Patient started on Rocephin and azithromycin, nasal cannula oxygen.  Cardiology consulted.  Echocardiogram with normal ejection fraction at.  Started on IV heparin for non-STEMI.  Patient subsequently decompensated and placed on BiPAP.  She was still tachypneic so she was intubated and placed on vent and transferred to [facility] Significant events  2/7 admitted to [facility] with chest pain and SOB Intubated  2/8 transferred to ICU at  [facility]  Continuing antibiotics, IV diuresis, systemic steroids, following up on cultures remains on heparin. RVP positive for rhinovirus, ustrep neg, MRSA PCR pos. Sig bronchospasm. BDs and ICS optimized.  2/9 still having some vent synchrony difficulty. Increased prop ceiling to 75. Changed vent to PCV seems to be more comfortable. Metabolic acidosis improved. Still w/ sig wheeze. Change steroids to 40mg  bid  2/10, extubated to nasal cannula oxygen. 2/11, transferred to medical floor.  Subjective: Patient seen and examined.  Poor historian overall.  Denies any complaints.  Now on 4 L of oxygen.  She does have some cough.  Takes time to respond. Assessment & Plan:   Acute hypoxemic respite failure secondary to rhinovirus pneumonia,  superadded community-acquired pneumonia. Patient required mechanical ventilation and now on oxygen. Antibiotics to treat bacterial pneumonia, patient remains on Rocephin and azithromycin.  Will complete 5 days of therapy. Chest physiotherapy, incentive spirometry, deep breathing exercises, sputum induction, mucolytic's and bronchodilators. Sputum cultures, blood cultures, Legionella and streptococcal antigen.  Negative so far. Supplemental oxygen to keep saturations more than 90%. Rhinovirus was positive. Patient on high-dose steroids for bronchospasm, will continue to titrate down.  Non-STEMI: Troponin elevated and flat.  Troponin trending down now.  Received 48 hours of IV heparin.  Echocardiogram was grossly normal.  Cardiology continues to follow and plan for repeat echocardiogram.  Currently anticipating conservative management. Patient already on aspirin, Lipitor and metoprolol.  History of epilepsy: On Briviact, zonisamide and Trileptal.  No new seizure. Chronic hyponatremia: Stable at around baseline. Smoker: Counseled to quit.  Nicotine patch. Anxiety depression: On BuSpar and nortriptyline.   DVT prophylaxis: enoxaparin (LOVENOX) injection 40 mg Start: 09/01/23 1115   Code Status: Full code Family Communication: None at the bedside Disposition Plan: Status is: Inpatient  Remains inpatient appropriate because: iv antibiotics, oxygen need      Consultants:  Cardiology Pulmonary  Procedures:  Intubation  Antimicrobials:  Rocephin azithromycin 2/7---     Objective: Vitals:   09/02/23 2021 09/02/23 2056 09/03/23 0418 09/03/23 0548  BP: 139/77   (!) 140/95  Pulse: 99   91  Resp: 16   16  Temp: 98.1 F (36.7 C)   97.9 F (36.6 C)  TempSrc: Oral     SpO2:  100%  99%  Weight:   95.9 kg   Height:  Intake/Output Summary (Last 24 hours) at 09/03/2023 0759 Last data filed at 09/03/2023 0653 Gross per 24 hour  Intake 906.34 ml  Output 3 ml  Net 903.34 ml    Filed Weights   08/29/23 1000 08/31/23 0411 09/03/23 0418  Weight: 94 kg 95.8 kg 95.9 kg    Examination:  General: Comfortable.  Sitting in chair. Cardiovascular: S1-S2 normal.  Regular rate rhythm. Respiratory: Bilateral clear.  No added sounds. Gastrointestinal: Soft.  Nontender.  Bowel sound present. Ext: No swelling or edema.  No cyanosis. Neuro: Alert awake and mostly oriented.  Moves all extremities.  She has flat affect and is slow to respond. Musculoskeletal: No deformities.      Data Reviewed: I have personally reviewed following labs and imaging studies  CBC: Recent Labs  Lab 08/29/23 1000 08/30/23 0349 08/30/23 0856 08/30/23 1726 08/31/23 0233 08/31/23 1047 09/01/23 0216 09/01/23 0315 09/02/23 0349 09/03/23 0608  WBC 14.1*   < > 10.3  --  16.1*  --  10.7*  --  8.1 8.2  NEUTROABS 13.1*  --   --   --   --   --   --   --   --   --   HGB 12.5   < > 11.4*   < > 10.3* 10.2* 10.2* 9.9* 11.2* 11.1*  HCT 36.6   < > 34.3*   < > 31.1* 30.0* 30.6* 29.0* 33.9* 33.7*  MCV 91.5   < > 90.5  --  91.5  --  92.2  --  92.1 92.6  PLT 410*   < > 384  --  396  --  413*  --  448* 465*   < > = values in this interval not displayed.   Basic Metabolic Panel: Recent Labs  Lab 08/30/23 0400 08/30/23 0856 08/30/23 0859 08/30/23 1659 08/30/23 1726 08/31/23 0233 08/31/23 1047 08/31/23 1640 09/01/23 0216 09/01/23 0315 09/02/23 0349 09/03/23 0608  NA  --    < >  --  131*   < > 134*   < > 134* 135 134* 142 136  K  --    < >  --  4.5   < > 4.0   < > 4.7 4.6 4.8 4.1 4.2  CL  --    < >  --  100  --  104  --  104 103  --  106 104  CO2  --    < >  --  17*  --  19*  --  20* 19*  --  20* 20*  GLUCOSE  --    < >  --  119*  --  115*  --  125* 133*  --  121* 113*  BUN  --    < >  --  15  --  20  --  16 13  --  15 12  CREATININE  --    < >  --  1.12*  --  0.94  --  0.79 0.63  --  0.77 0.62  CALCIUM  --    < >  --  8.8*  --  8.3*  --  8.7* 9.0  --  9.3 9.1  MG 2.2  --  2.2 2.2  --   2.1  --  2.2  --   --   --   --   PHOS 5.6*  --  4.9* 5.3*  --  4.5  --  3.8  --   --   --   --    < > =  values in this interval not displayed.   GFR: Estimated Creatinine Clearance: 94.9 mL/min (by C-G formula based on SCr of 0.62 mg/dL). Liver Function Tests: Recent Labs  Lab 08/29/23 1000  AST 30  ALT 11  ALKPHOS 140*  BILITOT 0.6  PROT 8.1  ALBUMIN 3.2*   Recent Labs  Lab 09/01/23 0216  LIPASE 18   No results for input(s): "AMMONIA" in the last 168 hours. Coagulation Profile: No results for input(s): "INR", "PROTIME" in the last 168 hours. Cardiac Enzymes: No results for input(s): "CKTOTAL", "CKMB", "CKMBINDEX", "TROPONINI" in the last 168 hours. BNP (last 3 results) No results for input(s): "PROBNP" in the last 8760 hours. HbA1C: No results for input(s): "HGBA1C" in the last 72 hours. CBG: Recent Labs  Lab 09/02/23 1226 09/02/23 1614 09/02/23 2017 09/03/23 0016 09/03/23 0409  GLUCAP 126* 106* 110* 97 110*   Lipid Profile: Recent Labs    09/01/23 0216 09/02/23 0349  TRIG 56 89   Thyroid Function Tests: No results for input(s): "TSH", "T4TOTAL", "FREET4", "T3FREE", "THYROIDAB" in the last 72 hours. Anemia Panel: No results for input(s): "VITAMINB12", "FOLATE", "FERRITIN", "TIBC", "IRON", "RETICCTPCT" in the last 72 hours. Sepsis Labs: Recent Labs  Lab 08/29/23 1843 08/29/23 2009 08/29/23 2321 08/30/23 0856 08/30/23 1145 08/31/23 0233 08/31/23 1021  PROCALCITON 0.44  --   --  0.38  --  0.30  --   LATICACIDVEN 1.8   < > 1.3 1.6 2.1*  --  1.2   < > = values in this interval not displayed.    Recent Results (from the past 240 hours)  Resp panel by RT-PCR (RSV, Flu A&B, Covid) Anterior Nasal Swab     Status: None   Collection Time: 08/29/23 10:13 AM   Specimen: Anterior Nasal Swab  Result Value Ref Range Status   SARS Coronavirus 2 by RT PCR NEGATIVE NEGATIVE Final    Comment: (NOTE) SARS-CoV-2 target nucleic acids are NOT DETECTED.  The  SARS-CoV-2 RNA is generally detectable in upper respiratory specimens during the acute phase of infection. The lowest concentration of SARS-CoV-2 viral copies this assay can detect is 138 copies/mL. A negative result does not preclude SARS-Cov-2 infection and should not be used as the sole basis for treatment or other patient management decisions. A negative result may occur with  improper specimen collection/handling, submission of specimen other than nasopharyngeal swab, presence of viral mutation(s) within the areas targeted by this assay, and inadequate number of viral copies(<138 copies/mL). A negative result must be combined with clinical observations, patient history, and epidemiological information. The expected result is Negative.  Fact Sheet for Patients:  BloggerCourse.com  Fact Sheet for Healthcare Providers:  SeriousBroker.it  This test is no t yet approved or cleared by the [place] FDA and  has been authorized for detection and/or diagnosis of SARS-CoV-2 by FDA under an Emergency Use Authorization (EUA). This EUA will remain  in effect (meaning this test can be used) for the duration of the COVID-19 declaration under Section 564(b)(1) of the Act, 21 U.S.C.section 360bbb-3(b)(1), unless the authorization is terminated  or revoked sooner.       Influenza A by PCR NEGATIVE NEGATIVE Final   Influenza B by PCR NEGATIVE NEGATIVE Final    Comment: (NOTE) The Xpert Xpress SARS-CoV-2/FLU/RSV plus assay is intended as an aid in the diagnosis of influenza from Nasopharyngeal swab specimens and should not be used as a sole basis for treatment. Nasal washings and aspirates are unacceptable for Xpert Xpress SARS-CoV-2/FLU/RSV testing.  Fact Sheet for Patients: BloggerCourse.com  Fact Sheet for Healthcare Providers: SeriousBroker.it  This test is not yet approved or  cleared by the [place] FDA and has been authorized for detection and/or diagnosis of SARS-CoV-2 by FDA under an Emergency Use Authorization (EUA). This EUA will remain in effect (meaning this test can be used) for the duration of the COVID-19 declaration under Section 564(b)(1) of the Act, 21 U.S.C. section 360bbb-3(b)(1), unless the authorization is terminated or revoked.     Resp Syncytial Virus by PCR NEGATIVE NEGATIVE Final    Comment: (NOTE) Fact Sheet for Patients: BloggerCourse.com  Fact Sheet for Healthcare Providers: SeriousBroker.it  This test is not yet approved or cleared by the [place] FDA and has been authorized for detection and/or diagnosis of SARS-CoV-2 by FDA under an Emergency Use Authorization (EUA). This EUA will remain in effect (meaning this test can be used) for the duration of the COVID-19 declaration under Section 564(b)(1) of the Act, 21 U.S.C. section 360bbb-3(b)(1), unless the authorization is terminated or revoked.  Performed at [facility], [address] [place], [place] [address]   Culture, blood (Routine X 2) w Reflex to ID Panel     Status: None   Collection Time: 08/29/23 10:25 AM   Specimen: Left Antecubital; Blood  Result Value Ref Range Status   Specimen Description LEFT ANTECUBITAL  Final   Special Requests BOTTLES DRAWN AEROBIC AND ANAEROBIC  Final   Culture   Final    NO GROWTH 5 DAYS Performed at [facility], [address], [place], [place] [address]    Report Status 09/03/2023 FINAL  Final  Culture, blood (Routine X 2) w Reflex to ID Panel     Status: None   Collection Time: 08/29/23  6:12 PM   Specimen: Right Antecubital; Blood  Result Value Ref Range Status   Specimen Description RIGHT ANTECUBITAL  Final   Special Requests BOTTLES DRAWN AEROBIC AND ANAEROBIC  Final   Culture   Final    NO GROWTH 5 DAYS Performed at [facility], [address], [place], [place]  [address]    Report Status 09/03/2023 FINAL  Final  MRSA Next Gen by PCR, Nasal     Status: Abnormal   Collection Time: 08/30/23  1:01 AM   Specimen: Urine, Catheterized; Nasal Swab  Result Value Ref Range Status   MRSA by PCR Next Gen DETECTED (A) NOT DETECTED Final    Comment: RESULT CALLED TO, READ BACK BY AND VERIFIED WITH: D WASHINGTON RN 08/30/2023 @ 0409 BY AB (NOTE) The GeneXpert MRSA Assay (FDA approved for NASAL specimens only), is one component of a comprehensive MRSA colonization surveillance program. It is not intended to diagnose MRSA infection nor to guide or monitor treatment for MRSA infections. Test performance is not FDA approved in patients less than 31 years old. Performed at [facility], [address], [place], [place] [address]   Respiratory (~20 pathogens) panel by PCR     Status: Abnormal   Collection Time: 08/30/23  2:09 PM   Specimen: Nasopharyngeal Swab; Respiratory  Result Value Ref Range Status   Adenovirus NOT DETECTED NOT DETECTED Final   Coronavirus 229E NOT DETECTED NOT DETECTED Final    Comment: (NOTE) The Coronavirus on the Respiratory Panel, DOES NOT test for the novel  Coronavirus (2019 nCoV)    Coronavirus HKU1 NOT DETECTED NOT DETECTED Final   Coronavirus NL63 NOT DETECTED NOT DETECTED Final   Coronavirus OC43 NOT DETECTED NOT DETECTED Final  Metapneumovirus NOT DETECTED NOT DETECTED Final   Rhinovirus / Enterovirus DETECTED (A) NOT DETECTED Final   Influenza A NOT DETECTED NOT DETECTED Final   Influenza B NOT DETECTED NOT DETECTED Final   Parainfluenza Virus 1 NOT DETECTED NOT DETECTED Final   Parainfluenza Virus 2 NOT DETECTED NOT DETECTED Final   Parainfluenza Virus 3 NOT DETECTED NOT DETECTED Final   Parainfluenza Virus 4 NOT DETECTED NOT DETECTED Final   Respiratory Syncytial Virus NOT DETECTED NOT DETECTED Final   Bordetella pertussis NOT DETECTED NOT DETECTED Final   Bordetella Parapertussis NOT DETECTED NOT DETECTED Final    Chlamydophila pneumoniae NOT DETECTED NOT DETECTED Final   Mycoplasma pneumoniae NOT DETECTED NOT DETECTED Final    Comment: Performed at [facility], [address] [place], [place] [address]  Culture, Respiratory w Gram Stain     Status: None   Collection Time: 08/30/23 10:51 PM   Specimen: Tracheal Aspirate; Respiratory  Result Value Ref Range Status   Specimen Description TRACHEAL ASPIRATE  Final   Special Requests NONE  Final   Gram Stain   Final    NO WBC SEEN NO ORGANISMS SEEN Performed at [facility], [address], [place], [place] [address]    Culture RARE CANDIDA ALBICANS  Final   Report Status 09/02/2023 FINAL  Final         Radiology Studies: DG Chest Port 1 View Result Date: 09/02/2023 CLINICAL DATA:  Acute respiratory failure EXAM: PORTABLE CHEST 1 VIEW COMPARISON:  09/01/2023 FINDINGS: Endotracheal tube and gastric catheter have been removed in the interval. Stable cardiomegaly is noted. Vascular congestion is again identified although somewhat improved when compared with the prior exam. Basilar atelectasis is noted right worse than left. No pneumothorax is seen. IMPRESSION: Improvement in vascular congestion when compared with the previous exam. Persistent bibasilar atelectasis is noted. Electronically Signed   By: Alcide Clever M.D.   On: 09/02/2023 10:14   DG Abd 1 View Result Date: 09/02/2023 CLINICAL DATA:  43 year old female with history of abdominal distension. EXAM: ABDOMEN - 1 VIEW COMPARISON:  08/31/2023. FINDINGS: Multiple prominent loops of gas-filled small bowel and colon. Distal rectal gas is noted. No definitive pneumoperitoneum confidently identified. IMPRESSION: 1. Nonspecific bowel gas pattern, as above, concerning for probable ileus. Electronically Signed   By: Trudie Reed M.D.   On: 09/02/2023 06:14        Scheduled Meds:  arformoterol  15 mcg Nebulization BID   aspirin  81 mg Oral Daily   atorvastatin  40 mg Oral Daily    brivaracetam  100 mg Oral BID   budesonide (PULMICORT) nebulizer solution  0.5 mg Nebulization BID   busPIRone  5 mg Oral BID   docusate sodium  100 mg Oral BID   enoxaparin (LOVENOX) injection  40 mg Subcutaneous Q24H   feeding supplement  237 mL Oral BID BM   insulin aspart  0-15 Units Subcutaneous Q4H   methylPREDNISolone (SOLU-MEDROL) injection  60 mg Intravenous QHS   metoprolol tartrate  2.5 mg Intravenous Q12H   multivitamin with minerals  1 tablet Oral Daily   mupirocin ointment  1 Application Nasal BID   nortriptyline  25 mg Oral QHS   mouth rinse  15 mL Mouth Rinse Q2H   OXcarbazepine  600 mg Oral q morning   And   OXcarbazepine  900 mg Oral QPM   polyethylene glycol  17 g Per Tube Daily   revefenacin  175 mcg Nebulization Daily  senna  1 tablet Oral BID   SUMAtriptan  50 mg Oral Once   zonisamide  500 mg Oral QPM   And   zonisamide  100 mg Oral q morning   Continuous Infusions:   LOS: 5 days    Time spent: 50 minutes    Dorcas Carrow, MD [facility]

## 2023-09-03 NOTE — Progress Notes (Signed)
Physical Therapy Treatment Patient Details Name: [name] MRN: [id number] DOB: May 04, 1981 Today's Date: 09/03/2023   History of Present Illness 43 y.o. female presents to [facility] hospital on 08/29/2023 as a transfer from [place] due to respiratory distress, intubated on 2/7. Pt found to have rhinovirus, also with concern for NSTEMI. Extubated 2/10. 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    PT Comments  Pt received in supine, agreeable to therapy session with encouragement, pt initially upset due to some family members reporting cold symptoms, pt reassured and agreeable to participate after discussion on benefits of mobility. Pt needing up to minA for transfer and gait safety using RW, with posterior instability upon standing and during gait trial, and needing cues for safer RW management/not to pick RW up off the floor. Pt SpO2 desat to 85% with exertion/even with standing break, did need briefly 6L/min O2 [place] but able to wean back down to 4L/min midway through gait trial with cues for pursed-lip breathing and activity pacing. Pt agreeable to sit up in chair with family/friends arriving to visit at end of session. Pt continues to benefit from PT services to progress toward functional mobility goals.     If plan is discharge home, recommend the following: A little help with walking and/or transfers;A lot of help with bathing/dressing/bathroom;Assistance with cooking/housework;Direct supervision/assist for medications management;Direct supervision/assist for financial management;Assist for transportation;Help with stairs or ramp for entrance;Supervision due to cognitive status   Can travel by private vehicle        Equipment Recommendations  Other (comment) (RW pending progress)    Recommendations for Other Services       Precautions / Restrictions Precautions Precautions: Fall;Other  (comment) Recall of Precautions/Restrictions: Impaired Precaution/Restrictions Comments: needs cues for hand placement during transfers and not to pick RW up off the floor; Droplet precs Required Braces or Orthoses:  (face mask in hallway, pt agreeable to don) Restrictions Weight Bearing Restrictions Per Provider Order: No     Mobility  Bed Mobility Overal bed mobility: Needs Assistance Bed Mobility: Supine to Sit     Supine to sit: Supervision, HOB elevated, Used rails     General bed mobility comments: increased time to perform    Transfers Overall transfer level: Needs assistance Equipment used: Rolling walker (2 wheels) Transfers: Sit to/from Stand, Bed to chair/wheelchair/BSC Sit to Stand: Min assist           General transfer comment: STS x 2 reps from EOB for teachback of safer UE placement; from EOB<>RW and RW>chair with minA due to posterior instability upon standing. Pt also with LOB turning to take steps away from bed due to lifting RW off the floor.    Ambulation/Gait Ambulation/Gait assistance: Min assist Gait Distance (Feet): 75 Feet Assistive device: Rolling walker (2 wheels) Gait Pattern/deviations: Step-through pattern, Leaning posteriorly, Decreased dorsiflexion - right, Decreased dorsiflexion - left       General Gait Details: Pt needs cues frequently for improved forward gaze and not to lift RW off the floor, due to multiple posterior LOB when lifting RW up instead of just pushing it. SpO2 briefly reading 85% despite cues for pursed-lip breathing and standing break, so portable O2 turned up to 6L/min, with rapid improvement to >94% SpO2 for next 49ft, so pt weaned back to 4L/min and SpO2 WFL on this level for remainder of distance.   Stairs  Wheelchair Mobility     Tilt Bed    Modified Rankin (Stroke Patients Only)       Balance Overall balance assessment: Needs assistance Sitting-balance support: No upper extremity  supported, Feet supported Sitting balance-Leahy Scale: Fair     Standing balance support: Single extremity supported, Reliant on assistive device for balance Standing balance-Leahy Scale: Poor                              Communication Communication Communication: No apparent difficulties  Cognition Arousal: Alert Behavior During Therapy: WFL for tasks assessed/performed   PT - Cognitive impairments: Orientation, Awareness, Attention, Problem solving, Safety/Judgement   Orientation impairments: Time                   PT - Cognition Comments: Unclear baseline, pt with decreased safety awareness especially with transfers and RW management, needs mod cues for safety/body mechanics. Pt was anxious and upset when PTA arrived to the room due to patient's concern that she made other family members sick, so PTA showed her (via phone) what rhinovirus was (common cold virus) and encouraged pt not to feel guilty, as this is can be difficult to avoid during cold season, and most people have more minor symptoms. Following commands: Impaired Following commands impaired: Follows one step commands with increased time    Cueing Cueing Techniques: Verbal cues  Exercises Other Exercises Other Exercises: seated BLE AROM: LAQ x 5 reps ea    General Comments General comments (skin integrity, edema, etc.): See gait comments for SpO2; HR WFL      Pertinent Vitals/Pain Pain Assessment Pain Assessment: No/denies pain    Home Living                          Prior Function            PT Goals (current goals can now be found in the care plan section) Acute Rehab PT Goals Patient Stated Goal: to return home PT Goal Formulation: With patient Time For Goal Achievement: 09/16/23 Progress towards PT goals: Progressing toward goals    Frequency    Min 1X/week      PT Plan      Co-evaluation              AM-PAC PT "6 Clicks" Mobility   Outcome Measure   Help needed turning from your back to your side while in a flat bed without using bedrails?: A Little Help needed moving from lying on your back to sitting on the side of a flat bed without using bedrails?: A Little Help needed moving to and from a bed to a chair (including a wheelchair)?: A Little Help needed standing up from a chair using your arms (e.g., wheelchair or bedside chair)?: A Little Help needed to walk in hospital room?: A Lot Help needed climbing 3-5 steps with a railing? : Total (mod cues) 6 Click Score: 15    End of Session Equipment Utilized During Treatment: Gait belt;Oxygen Activity Tolerance: Patient tolerated treatment well;Patient limited by fatigue Patient left: in chair;with call bell/phone within reach;with chair alarm set;with family/visitor present;Other (comment) (x2 visitors (masked) in room to visit her) Nurse Communication: Mobility status PT Visit Diagnosis: Other abnormalities of gait and mobility (R26.89);Muscle weakness (generalized) (M62.81)     Time: 1736-1800 PT Time Calculation (min) (ACUTE ONLY): 24 min  Charges:    $Gait Training: 8-22  mins $Therapeutic Activity: 8-22 mins PT General Charges $$ ACUTE PT VISIT: 1 Visit                     Kimari Lienhard P., PTA Acute Rehabilitation Services Secure Chat Preferred 9a-5:30pm Office: [phone]    Dorathy Kinsman [facility] 09/03/2023, 7:14 PM

## 2023-09-03 NOTE — Evaluation (Signed)
Occupational Therapy Evaluation Patient Details Name: [name] MRN: [id number] DOB: 19-Dec-1980 Today's Date: 09/03/2023   History of Present Illness   History of Present Illness: 43 y.o. female presents to [facility] hospital on 08/29/2023 as a transfer from [place] due to respiratory distress, intubated on 2/7. Pt found to have rhinovirus, also with concern for NSTEMI. Extubated 2/10. 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     Clinical Impressions PTA, pt lived alone and reports her son was her aid and worked from 900 to 1300 5 days per week; often visits in addition to work hours on weekends and in the evening. Aide assists with shower transfer, LB ADL, and IADL. Upon eval, pt dependent on supplementary O2, with decreased activity tolerance, gross strength, and cardiopulmonary endurance. Pt on 4L via [place] on arrival but ultimately able to wean to 3L at rest by end of session. Pt needing CGA for step pivot transfers with support of RW this session and up to min A for LB ADL. Observed with decreased memory and executive function, but pt reports this is baseline. Able to sequence basic tasks. Recommending discharge home with increased assist from family and HHOT.      If plan is discharge home, recommend the following:   A little help with walking and/or transfers;A little help with bathing/dressing/bathroom;Assistance with cooking/housework;Assist for transportation;Help with stairs or ramp for entrance;Direct supervision/assist for medications management;Direct supervision/assist for financial management     Functional Status Assessment   Patient has had a recent decline in their functional status and demonstrates the ability to make significant improvements in function in a reasonable and predictable amount of time.     Equipment Recommendations   Other (comment) (RW)      Recommendations for Other Services         Precautions/Restrictions   Precautions Precautions: Fall Recall of Precautions/Restrictions: Impaired Precaution/Restrictions Comments: needs cues for hand placement during transfers. Restrictions Weight Bearing Restrictions Per Provider Order: No     Mobility Bed Mobility Overal bed mobility: Needs Assistance Bed Mobility: Supine to Sit     Supine to sit: Supervision, HOB elevated          Transfers Overall transfer level: Needs assistance Equipment used: Rolling walker (2 wheels) Transfers: Sit to/from Stand, Bed to chair/wheelchair/BSC Sit to Stand: Contact guard assist     Step pivot transfers: Contact guard assist     General transfer comment: CGA for safety with use of RW      Balance Overall balance assessment: Needs assistance Sitting-balance support: No upper extremity supported, Feet supported Sitting balance-Leahy Scale: Fair     Standing balance support: Single extremity supported, Reliant on assistive device for balance Standing balance-Leahy Scale: Poor                             ADL either performed or assessed with clinical judgement   ADL Overall ADL's : Needs assistance/impaired Eating/Feeding: Set up;Sitting   Grooming: Set up;Sitting   Upper Body Bathing: Set up;Sitting   Lower Body Bathing: Minimal assistance;Sit to/from stand   Upper Body Dressing : Set up;Sitting   Lower Body Dressing: Minimal assistance;Sit to/from stand   Toilet Transfer: Contact guard assist;Rolling walker (2 wheels);Stand-pivot Statistician Details (indicate cue type and reason): slowed pace of transfer with pt self initiating rest breaks between steps. (2 rest breaks with 4-5 steps toward  chair Toileting- Clothing Manipulation and Hygiene: Minimal assistance;Sit to/from stand       Functional mobility during ADLs: Contact guard assist;Rolling walker (2 wheels) General ADL Comments:  limited in distance today secondary to decresaed activity [place].     Vision Patient Visual Report: No change from baseline       Perception Perception: Not tested       Praxis Praxis: Not tested       Pertinent Vitals/Pain Pain Assessment Pain Assessment: No/denies pain     Extremity/Trunk Assessment Upper Extremity Assessment Upper Extremity Assessment: Generalized weakness   Lower Extremity Assessment Lower Extremity Assessment: Generalized weakness   Cervical / Trunk Assessment Cervical / Trunk Assessment: Normal   Communication Communication Communication: No apparent difficulties   Cognition Arousal: Alert Behavior During Therapy: WFL for tasks assessed/performed Cognition: History of cognitive impairments             OT - Cognition Comments: Pt reports history of memory impaurment at baseline secondary to her seizures. pt oriented to day and month; year with increased time, oriented to place, person, and situation as well.                 Following commands: Intact       Cueing  General Comments   Cueing Techniques: Verbal cues      Exercises     Shoulder Instructions      Home Living Family/patient expects to be discharged to:: Private residence Living Arrangements: Alone Available Help at Discharge: Family;Available 24 hours/day (pt reports 24/7 between her mom and son (son is also her aide)) Type of Home: Apartment Home Access: Stairs to enter [employer] of Steps: 6 Entrance Stairs-Rails: Can reach both Home Layout: One level     Bathroom Shower/Tub: Chief Strategy Officer: Standard     Home Equipment: BSC/3in1;Shower seat;Cane - single point          Prior Functioning/Environment Prior Level of Function : Needs assist             Mobility Comments: ambulatory with support of SPC ADLs Comments: son assists with LB dressing, provides supervision for shower transfers, medication  management, IADLs, family provides transport    OT Problem List: Decreased strength;Decreased activity tolerance;Impaired balance (sitting and/or standing);Decreased cognition;Decreased safety awareness;Decreased knowledge of use of DME or AE;Cardiopulmonary status limiting activity   OT Treatment/Interventions: Self-care/ADL training;Therapeutic exercise;DME and/or AE instruction;Energy conservation;Balance training;Patient/family education;Therapeutic activities;Cognitive remediation/compensation      OT Goals(Current goals can be found in the care plan section)   Acute Rehab OT Goals Patient Stated Goal: get better OT Goal Formulation: With patient Time For Goal Achievement: 09/17/23 Potential to Achieve Goals: Good   OT Frequency:  Min 1X/week    Co-evaluation              AM-PAC OT "6 Clicks" Daily Activity     Outcome Measure Help from another person eating meals?: None Help from another person taking care of personal grooming?: A Little Help from another person toileting, which includes using toliet, bedpan, or urinal?: A Little Help from another person bathing (including washing, rinsing, drying)?: A Little Help from another person to put on and taking off regular upper body clothing?: A Little Help from another person to put on and taking off regular lower body clothing?: A Little 6 Click Score: 19   End of Session Equipment Utilized During Treatment: Gait belt;Rolling walker (2 wheels) Nurse Communication: Mobility status  Activity Tolerance: Patient  tolerated treatment well Patient left: in chair;with call bell/phone within reach;with chair alarm set;with nursing/sitter in room (NT in room, soft mat on floor in front of chair as pt with seizure precautions)  OT Visit Diagnosis: Unsteadiness on feet (R26.81);Muscle weakness (generalized) (M62.81);Other symptoms and signs involving cognitive function;Other (comment) (decreased activity tolerance.)                 Time: 8657-8469 OT Time Calculation (min): 28 min Charges:  OT General Charges $OT Visit: 1 Visit OT Evaluation $OT Eval Moderate Complexity: 1 Mod OT Treatments $Self Care/Home Management : 8-22 mins  Tyler Deis, OTR/L [facility] Acute Rehabilitation Office: [phone]   Myrla Halsted 09/03/2023, 11:05 AM

## 2023-09-04 ENCOUNTER — Ambulatory Visit: Payer: Self-pay | Admitting: Family Medicine

## 2023-09-04 ENCOUNTER — Inpatient Hospital Stay (HOSPITAL_COMMUNITY): Payer: 59

## 2023-09-04 DIAGNOSIS — J9601 Acute respiratory failure with hypoxia: Secondary | ICD-10-CM | POA: Diagnosis not present

## 2023-09-04 DIAGNOSIS — I214 Non-ST elevation (NSTEMI) myocardial infarction: Secondary | ICD-10-CM

## 2023-09-04 LAB — GLUCOSE, CAPILLARY
Glucose-Capillary: 116 mg/dL — ABNORMAL HIGH (ref 70–99)
Glucose-Capillary: 89 mg/dL (ref 70–99)
Glucose-Capillary: 92 mg/dL (ref 70–99)

## 2023-09-04 LAB — ECHOCARDIOGRAM LIMITED
Calc EF: 69.3 %
Height: 61 in
Single Plane A2C EF: 67.4 %
Single Plane A4C EF: 69.7 %
Weight: 3382.74 [oz_av]

## 2023-09-04 LAB — BRAIN NATRIURETIC PEPTIDE: B Natriuretic Peptide: 25.5 pg/mL (ref 0.0–100.0)

## 2023-09-04 MED ORDER — METOPROLOL TARTRATE 25 MG PO TABS
25.0000 mg | ORAL_TABLET | Freq: Two times a day (BID) | ORAL | Status: DC
  Administered 2023-09-04 – 2023-09-05 (×2): 25 mg via ORAL
  Filled 2023-09-04 (×3): qty 1

## 2023-09-04 NOTE — Progress Notes (Addendum)
Physical Therapy Treatment Patient Details Name: [name] MRN: [id number] DOB: Sep 13, 1980 Today's Date: 09/04/2023   History of Present Illness 43 y.o. female presents to [facility] hospital on 08/29/2023 as a transfer from [place] due to respiratory distress, intubated on 2/7. Pt found to have rhinovirus, also with concern for NSTEMI. Extubated 2/10. 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    PT Comments  Pt received up in chair, pleasantly agreeable to therapy session and with good participation and tolerance for transfer, gait and stair training. Pt remains unsteady without assistive device, needing CGA to minA to steady and with increased postural sway and LOB toward her R side. Pt needing up to Supervision/CGA when using RW for support and reports feeling steadier. DOE 2/4 and SpO2 WFL on RA, HR elevated to 120's bpm with exertion. Pt back in supine with alarm on for safety and pt agreeable to use call bell for OOB mobility needs. Pt continues to benefit from PT services to progress toward functional mobility goals, continue to recommend HHPT upon DC.    If plan is discharge home, recommend the following: A little help with walking and/or transfers;A lot of help with bathing/dressing/bathroom;Assistance with cooking/housework;Direct supervision/assist for medications management;Direct supervision/assist for financial management;Assist for transportation;Help with stairs or ramp for entrance;Supervision due to cognitive status   Can travel by private vehicle        Equipment Recommendations  Rolling walker (2 wheels)    Recommendations for Other Services       Precautions / Restrictions Precautions Precautions: Fall;Other (comment) Recall of Precautions/Restrictions: Impaired Precaution/Restrictions Comments: needs cues for hand placement during transfers; Droplet precs Required  Braces or Orthoses:  (face mask in hallway, pt agreeable to don) Restrictions Weight Bearing Restrictions Per Provider Order: No     Mobility  Bed Mobility Overal bed mobility: Needs Assistance Bed Mobility: Sit to Supine       Sit to supine: Supervision, Used rails   General bed mobility comments: no assist this date, use of rails    Transfers Overall transfer level: Needs assistance Equipment used: Rolling walker (2 wheels) Transfers: Sit to/from Stand Sit to Stand: Supervision           General transfer comment: cues for safe UE placement; standing from chair without AD, then to/from chair with RW, then to EOB    Ambulation/Gait Ambulation/Gait assistance: Supervision, Min assist Gait Distance (Feet): 100 Feet (140ft with no AD, seated break, then 13ft with RW) Assistive device: Rolling walker (2 wheels), None Gait Pattern/deviations: Step-through pattern, Decreased dorsiflexion - right, Decreased dorsiflexion - left, Staggering right, Drifts right/left, Wide base of support Gait velocity: variable; grossly <0.4 m/s     General Gait Details: SpO2 WFL on RA, DOE 2/4, tachy to 122 bpm with exertion. x1 seated break when HR elevated, and so PTA can bring her RW to her, pt needing up to minA for stability with no AD, and Supervision when using RW this date, much steadier today. HR decreases appropriately with seated break.   Stairs Stairs: Yes Stairs assistance: Contact guard assist Stair Management: One rail Left, Step to pattern, Forwards Number of Stairs: 3 General stair comments: heavy reliance on rail, step-to pattern   Wheelchair Mobility     Tilt Bed    Modified Rankin (Stroke Patients Only)       Balance Overall balance assessment: Needs assistance Sitting-balance support: No upper extremity supported, Feet supported Sitting  balance-Leahy Scale: Good     Standing balance support: Single extremity supported, Reliant on assistive device for  balance, No upper extremity supported Standing balance-Leahy Scale: Poor Standing balance comment: fair with RW, poor without AD                            Communication Communication Communication: No apparent difficulties  Cognition Arousal: Alert Behavior During Therapy: WFL for tasks assessed/performed, Impulsive   PT - Cognitive impairments: Problem solving, Safety/Judgement                     PT - Cognition Comments: Mild impulsivity, decreased safety awareness. Following commands: Impaired Following commands impaired: Follows one step commands with increased time    Cueing Cueing Techniques: Verbal cues  Exercises      General Comments General comments (skin integrity, edema, etc.): SpO2 WFL on RA, HR tachy with exertion, improves at rest      Pertinent Vitals/Pain Pain Assessment Pain Assessment: No/denies pain    Home Living                          Prior Function            PT Goals (current goals can now be found in the care plan section) Acute Rehab PT Goals Patient Stated Goal: to return home PT Goal Formulation: With patient Time For Goal Achievement: 09/16/23 Progress towards PT goals: Progressing toward goals    Frequency    Min 1X/week      PT Plan      Co-evaluation              AM-PAC PT "6 Clicks" Mobility   Outcome Measure  Help needed turning from your back to your side while in a flat bed without using bedrails?: A Little Help needed moving from lying on your back to sitting on the side of a flat bed without using bedrails?: A Little Help needed moving to and from a bed to a chair (including a wheelchair)?: A Little Help needed standing up from a chair using your arms (e.g., wheelchair or bedside chair)?: A Little Help needed to walk in hospital room?: A Little Help needed climbing 3-5 steps with a railing? : A Little 6 Click Score: 18    End of Session Equipment Utilized During Treatment:  Gait belt (brought O2 tank but did not need to be turned on, SpO2 WFL on RA) Activity Tolerance: Patient tolerated treatment well Patient left: with call bell/phone within reach;in bed;with bed alarm set Nurse Communication: Mobility status PT Visit Diagnosis: Other abnormalities of gait and mobility (R26.89);Muscle weakness (generalized) (M62.81)     Time: 8295-6213 PT Time Calculation (min) (ACUTE ONLY): 20 min  Charges:    $Gait Training: 8-22 mins PT General Charges $$ ACUTE PT VISIT: 1 Visit                     Kalah Pflum P., PTA Acute Rehabilitation Services Secure Chat Preferred 9a-5:30pm Office: [phone]    Dorathy Kinsman [facility] 09/04/2023, 5:23 PM

## 2023-09-04 NOTE — Plan of Care (Signed)
Problem: Education: Goal: Knowledge of General Education information will improve Description: Including pain rating scale, medication(s)/side effects and non-pharmacologic comfort measures Outcome: Progressing   Problem: Health Behavior/Discharge Planning: Goal: Ability to manage health-related needs will improve Outcome: Progressing   Problem: Clinical Measurements: Goal: Ability to maintain clinical measurements within normal limits will improve Outcome: Progressing Goal: Will remain free from infection Outcome: Progressing Goal: Diagnostic test results will improve Outcome: Progressing Goal: Respiratory complications will improve Outcome: Progressing Goal: Cardiovascular complication will be avoided Outcome: Progressing   Problem: Activity: Goal: Risk for activity intolerance will decrease Outcome: Progressing   Problem: Nutrition: Goal: Adequate nutrition will be maintained Outcome: Progressing   Problem: Coping: Goal: Level of anxiety will decrease Outcome: Progressing   Problem: Elimination: Goal: Will not experience complications related to bowel motility Outcome: Progressing Goal: Will not experience complications related to urinary retention Outcome: Progressing   Problem: Pain Managment: Goal: General experience of comfort will improve and/or be controlled Outcome: Progressing   Problem: Safety: Goal: Ability to remain free from injury will improve Outcome: Progressing   Problem: Skin Integrity: Goal: Risk for impaired skin integrity will decrease Outcome: Progressing   Problem: Activity: Goal: Ability to tolerate increased activity will improve Outcome: Progressing   Problem: Respiratory: Goal: Ability to maintain a clear airway and adequate ventilation will improve Outcome: Progressing   Problem: Role Relationship: Goal: Method of communication will improve Outcome: Progressing   Problem: Education: Goal: Ability to describe self-care  measures that may prevent or decrease complications (Diabetes Survival Skills Education) will improve Outcome: Progressing Goal: Individualized Educational Video(s) Outcome: Progressing   Problem: Coping: Goal: Ability to adjust to condition or change in health will improve Outcome: Progressing   Problem: Fluid Volume: Goal: Ability to maintain a balanced intake and output will improve Outcome: Progressing   Problem: Health Behavior/Discharge Planning: Goal: Ability to identify and utilize available resources and services will improve Outcome: Progressing Goal: Ability to manage health-related needs will improve Outcome: Progressing   Problem: Metabolic: Goal: Ability to maintain appropriate glucose levels will improve Outcome: Progressing   Problem: Nutritional: Goal: Maintenance of adequate nutrition will improve Outcome: Progressing Goal: Progress toward achieving an optimal weight will improve Outcome: Progressing   Problem: Skin Integrity: Goal: Risk for impaired skin integrity will decrease Outcome: Progressing   Problem: Tissue Perfusion: Goal: Adequacy of tissue perfusion will improve Outcome: Progressing

## 2023-09-04 NOTE — Progress Notes (Signed)
   Reviewed chart. Patient is still requiring a little bit of supplemental O2. She did initially receive a few doses of IV Lasix (last dose 2/11) but ongoing respiratory issues felt to be due to pneumonia rather than edema. BNP has trended down. Initial plan was for repeat limited Echo during admission to reassess questionable wall motion abnormality. However, I spoke with Dr. Jacques Navy and we can cancel this for now. No additional inpatient cardiac work-up necessary at this time. Troponin elevation is felt to likely be from demand ischemia secondary to acute illness. Although she did complete 48 hours of IV Heparin. Continue aspirin and statin. Will arrange outpatient follow-up. Can decide at that time whether repeat Echo is needed or whether any additional ischemic evaluation is warranted. Cardiology will sign off.  Corrin Parker, PA-C 09/04/2023 11:44 AM

## 2023-09-04 NOTE — Progress Notes (Signed)
   09/04/23 0913  Vitals  ECG Heart Rate (!) 105  Resp 20  Level of Consciousness  Level of Consciousness Alert  MEWS COLOR  MEWS Score Color Green  Oxygen Therapy  SpO2 (!) 88 %  O2 Device Nasal Cannula  O2 Flow Rate (L/min) 1 L/min  Patient Activity (if Appropriate) Ambulating  Pulse Oximetry Type Continuous  MEWS Score  MEWS Temp 0  MEWS Systolic 0  MEWS Pulse 1  MEWS RR 0  MEWS LOC 0  MEWS Score 1   Pt ambulating to bathroom and o2 dropped to 88% after taking deep breaths and coughing pt O2 went to 90% on 1L. When back in bed at rest O2 was 95% at 1L, pt used IS and educated to use every hour.

## 2023-09-04 NOTE — Progress Notes (Signed)
Mobility Specialist Progress Note:    09/04/23 1602  Mobility  Activity Ambulated with assistance in hallway  Level of Assistance Contact guard assist, steadying assist  Assistive Device Front wheel walker  Distance Ambulated (ft) 100 ft  Activity Response Tolerated well  Mobility Referral Yes  Mobility visit 1 Mobility  Mobility Specialist Start Time (ACUTE ONLY) 1432  Mobility Specialist Stop Time (ACUTE ONLY) 1447  Mobility Specialist Time Calculation (min) (ACUTE ONLY) 15 min   Received pt in bed having no complaints and agreeable to mobility. Pt was asymptomatic throughout ambulation and returned to room w/o fault. Left in chair w/ call bell in reach and all needs met.   Thompson Grayer Mobility Specialist  Please contact vis Secure Chat or  Rehab Office [phone]

## 2023-09-04 NOTE — Progress Notes (Signed)
   09/04/23 1736  Oxygen Therapy  SpO2 98 %  O2 Device Room Air  Patient Activity (if Appropriate) In bed  Pulse Oximetry Type Continuous   Pt placed on room air, tolerating well denies shortness of breath.

## 2023-09-04 NOTE — Progress Notes (Signed)
SATURATION QUALIFICATIONS: (This note is used to comply with regulatory documentation for home oxygen)  Patient Saturations on Room Air at Rest = 99%  Patient Saturations on Room Air while Ambulating = 95%   Please briefly explain why patient needs home oxygen: Not applicable, pt SpO2 WFL on RA this date with exertional tasks (stair negotiation and ambulation during PT session).  Pt did have elevated HR to 122 bpm with standing activity but improved with seated break.

## 2023-09-04 NOTE — Progress Notes (Signed)
Physical Therapy Treatment Patient Details Name: [name] MRN: [id number] DOB: 19-Jun-1981 Today's Date: 09/04/2023   History of Present Illness 43 y.o. female presents to [facility] hospital on 08/29/2023 as a transfer from [place] due to respiratory distress, intubated on 2/7. Pt found to have rhinovirus, also with concern for NSTEMI. Extubated 2/10. 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    PT Comments  Pt received up in bathroom with bed alarm sounding, pt anxious due to onset of loose stools and reports she pressed call bell but staff did not come to her room in time. Pt agreeable to additional therapy session due to high fall risk and pt needing some assist for balance and safety while performing self-care tasks and instruction on use of AD for safety given fall risk. Pt needing up to CGA for safety during standing reaching task with single UE support and CGA for gait with RW over unstable surface (mat on floor in her room) otherwise Supervision for gait with RW. PTA reinforced need for her to wait for assist and BSC placed adjacent to her bed for pt safety in case she has another episode of loose stools, RN/NT also notified. PT agreeable to use call bell for further OOB mobility needs.   If plan is discharge home, recommend the following: A little help with walking and/or transfers;Assistance with cooking/housework;Direct supervision/assist for medications management;Direct supervision/assist for financial management;Assist for transportation;Help with stairs or ramp for entrance;Supervision due to cognitive status;A little help with bathing/dressing/bathroom   Can travel by private vehicle        Equipment Recommendations  Rolling walker (2 wheels)    Recommendations for Other Services       Precautions / Restrictions Precautions Precautions: Fall;Other (comment) Recall of  Precautions/Restrictions: Impaired Precaution/Restrictions Comments: needs cues for hand placement during transfers; Droplet precs Required Braces or Orthoses:  (face mask in hallway, pt agreeable to don) Restrictions Weight Bearing Restrictions Per Provider Order: No     Mobility  Bed Mobility Overal bed mobility: Needs Assistance Bed Mobility: Sit to Supine       Sit to supine: Supervision, Used rails   General bed mobility comments: no assist this date, use of rails    Transfers Overall transfer level: Needs assistance Equipment used: Rolling walker (2 wheels) Transfers: Sit to/from Stand Sit to Stand: Supervision           General transfer comment: cues for safe UE placement; standing from toilet without AD, then to EOB from RW    Ambulation/Gait Ambulation/Gait assistance: Supervision Gait Distance (Feet): 25 Feet Assistive device: Rolling walker (2 wheels), None Gait Pattern/deviations: Step-through pattern, Decreased dorsiflexion - right, Decreased dorsiflexion - left, Drifts right/left, Wide base of support Gait velocity: decreased     General Gait Details: SpO2 WFL on RA, DOE 2/4, pt c/o significant fatigue after standing for peri-care ~5 mins after episode of loose stools in bathroom and previous mobility in hallway with PTA so distance limited to room. (PTA came to room for second session for pt safety due to bed alarm sounding and no other staff available at that time).   Stairs Stairs: Yes Stairs assistance: Contact guard assist Stair Management: One rail Left, Step to pattern, Forwards Number of Stairs: 3 General stair comments: heavy reliance on rail, step-to pattern   Wheelchair Mobility     Tilt Bed    Modified Rankin (Stroke Patients Only)  Balance Overall balance assessment: Needs assistance Sitting-balance support: No upper extremity supported, Feet supported Sitting balance-Leahy Scale: Good     Standing balance support:  Single extremity supported, Reliant on assistive device for balance, No upper extremity supported Standing balance-Leahy Scale: Poor Standing balance comment: fair static standing, pt able to flex forward to perform her own peri-care with single UE support, mildly unsteady when unsupported and reaching. Fair to good with RW support today.                            Communication Communication Communication: No apparent difficulties  Cognition Arousal: Alert Behavior During Therapy: WFL for tasks assessed/performed, Impulsive   PT - Cognitive impairments: Problem solving, Safety/Judgement   Orientation impairments: Time                   PT - Cognition Comments: Decreased safety awareness, pt had gotten up to bathroom when NT did not immediately respond to call bell, pt reports it was due to new onset diarrhea. PTA discusses safety and need to wait for assist, and PTA brought BSC to her bedside just in case, pt agreeable to continue using call bell but now has option in case staff does not respond in time. Following commands: Impaired Following commands impaired: Follows one step commands with increased time    Cueing Cueing Techniques: Verbal cues  Exercises      General Comments General comments (skin integrity, edema, etc.): tachy with exertion; SpO2 WFL on RA      Pertinent Vitals/Pain Pain Assessment Pain Assessment: No/denies pain    Home Living                          Prior Function            PT Goals (current goals can now be found in the care plan section) Acute Rehab PT Goals Patient Stated Goal: to return home PT Goal Formulation: With patient Time For Goal Achievement: 09/16/23 Progress towards PT goals: Progressing toward goals    Frequency    Min 1X/week      PT Plan      Co-evaluation              AM-PAC PT "6 Clicks" Mobility   Outcome Measure  Help needed turning from your back to your side while in a  flat bed without using bedrails?: A Little Help needed moving from lying on your back to sitting on the side of a flat bed without using bedrails?: A Little Help needed moving to and from a bed to a chair (including a wheelchair)?: A Little Help needed standing up from a chair using your arms (e.g., wheelchair or bedside chair)?: A Little Help needed to walk in hospital room?: A Little Help needed climbing 3-5 steps with a railing? : A Little 6 Click Score: 18    End of Session Equipment Utilized During Treatment: Gait belt (brought O2 tank but did not need to be turned on, SpO2 WFL on RA) Activity Tolerance: Patient limited by fatigue Patient left: with call bell/phone within reach;in bed;with bed alarm set Nurse Communication: Mobility status;Other (comment) (episode of loose stools) PT Visit Diagnosis: Other abnormalities of gait and mobility (R26.89);Muscle weakness (generalized) (M62.81)     Time: 1610-9604 PT Time Calculation (min) (ACUTE ONLY): 9 min  Charges:    $Therapeutic Activity: 8-22 mins PT General Charges $$ ACUTE PT  VISIT: 1 Visit                     Ilze Roselli P., PTA Acute Rehabilitation Services Secure Chat Preferred 9a-5:30pm Office: [phone]    Dorathy Kinsman [facility] 09/04/2023, 5:31 PM

## 2023-09-04 NOTE — Progress Notes (Signed)
PROGRESS NOTE    [name]  MVH:[id number] DOB: Oct 28, 1980 DOA: 08/29/2023 PCP: Kerri Perches, MD    Brief Narrative:  43 year old with history of seizures, left temporal lobe encephalocele s/p resection, chronic hyponatremia, smoker, marijuana use, depression who presented to [facility] on 2/7 with shortness of breath, chest pain chills fatigue and dry cough for 3 days.  In the emergency room she was tachycardic and tachypneic with respiratory rate of 40.  Initial temperature 99.  Initially on nasal cannula oxygen.  COVID flu and RSV negative.  Mildly elevated troponins.  EKG nonischemic.  CTA chest with no PE, diffuse groundglass densities concerning for pneumonia.  WBC 14.  Procalcitonin 0.44.  Patient started on Rocephin and azithromycin, nasal cannula oxygen.  Cardiology consulted.  Echocardiogram with normal ejection fraction at.  Started on IV heparin for non-STEMI.  Patient subsequently decompensated and placed on BiPAP.  She was still tachypneic so she was intubated and placed on vent and transferred to [facility] Significant events  2/7 admitted to [facility] with chest pain and SOB Intubated  2/8 transferred to ICU at  [facility].  Continuing antibiotics, IV diuresis, systemic steroids, following up on cultures remains on heparin. RVP positive for rhinovirus, ustrep neg, MRSA PCR pos. Sig bronchospasm. BDs and ICS optimized.  2/9 still having some vent synchrony difficulty. Increased prop ceiling to 75. Changed vent to PCV seems to be more comfortable. Metabolic acidosis improved. Still w/ sig wheeze. Change steroids to 40mg  bid  2/10, extubated to nasal cannula oxygen. 2/11, transferred to medical floor.  Subjective:  Patient seen and examined.  Denies any complaints.  On 1 to 2 L of oxygen.  Wondering about going home. Assessment & Plan:   Acute hypoxemic respiratory failure secondary to rhinovirus pneumonia, superadded community-acquired  pneumonia. Patient required mechanical ventilation and now on oxygen. Patient will complete 5 days of Rocephin and azithromycin.   Continue with Chest physiotherapy, incentive spirometry, deep breathing exercises, sputum induction, mucolytic's and bronchodilators. Blood cultures negative so far. Supplemental oxygen to keep saturations more than 90%. Rhinovirus was positive. Patient on high-dose steroids for bronchospasm, will continue to titrate down.  Taper off prednisone today.  Non-STEMI: Troponin elevated and flat.  Troponin trending down now.  Received 48 hours of IV heparin.  Echocardiogram was grossly normal with some wall motion abnormality. Repeat echocardiogram today with normal ejection fraction, no evidence of wall motion abnormality. Discussed with cardiology.  They will schedule outpatient follow-up.  Currently recommended to continue aspirin and Lipitor.  Will add metoprolol. Patient already on aspirin, Lipitor   History of epilepsy: On Briviact, zonisamide and Trileptal.  No new seizure. Chronic hyponatremia: Stable at around baseline. Smoker: Counseled to quit.  Nicotine patch. Anxiety depression: On BuSpar and nortriptyline.   DVT prophylaxis: enoxaparin (LOVENOX) injection 40 mg Start: 09/01/23 1115   Code Status: Full code Family Communication: None at the bedside Disposition Plan: Status is: Inpatient  Remains inpatient appropriate because: iv antibiotics, oxygen need      Consultants:  Cardiology Pulmonary  Procedures:  Intubation  Antimicrobials:  Rocephin azithromycin 2/7--- 2/12     Objective: Vitals:   09/04/23 0814 09/04/23 0905 09/04/23 0913 09/04/23 0920  BP:      Pulse: 96     Resp: 16  20 20   Temp:      TempSrc:      SpO2: 100% 97% (!) 88% 95%  Weight:      Height:  Intake/Output Summary (Last 24 hours) at 09/04/2023 1348 Last data filed at 09/04/2023 1139 Gross per 24 hour  Intake 720 ml  Output --  Net 720 ml   Filed  Weights   08/29/23 1000 08/31/23 0411 09/03/23 0418  Weight: 94 kg 95.8 kg 95.9 kg    Examination:  General: Comfortable.  On 1 L oxygen at rest. Cardiovascular: S1-S2 normal.  Regular rate rhythm. Respiratory: Bilateral clear.  No added sounds. Gastrointestinal: Soft.  Nontender.  Bowel sound present. Ext: No swelling or edema.  No cyanosis. Neuro: Alert awake and mostly oriented.  Moves all extremities.  She has flat affect Musculoskeletal: No deformities.      Data Reviewed: I have personally reviewed following labs and imaging studies  CBC: Recent Labs  Lab 08/29/23 1000 08/30/23 0349 08/30/23 0856 08/30/23 1726 08/31/23 0233 08/31/23 1047 09/01/23 0216 09/01/23 0315 09/02/23 0349 09/03/23 0608  WBC 14.1*   < > 10.3  --  16.1*  --  10.7*  --  8.1 8.2  NEUTROABS 13.1*  --   --   --   --   --   --   --   --   --   HGB 12.5   < > 11.4*   < > 10.3* 10.2* 10.2* 9.9* 11.2* 11.1*  HCT 36.6   < > 34.3*   < > 31.1* 30.0* 30.6* 29.0* 33.9* 33.7*  MCV 91.5   < > 90.5  --  91.5  --  92.2  --  92.1 92.6  PLT 410*   < > 384  --  396  --  413*  --  448* 465*   < > = values in this interval not displayed.   Basic Metabolic Panel: Recent Labs  Lab 08/30/23 0400 08/30/23 0856 08/30/23 0859 08/30/23 1659 08/30/23 1726 08/31/23 0233 08/31/23 1047 08/31/23 1640 09/01/23 0216 09/01/23 0315 09/02/23 0349 09/03/23 0608  NA  --    < >  --  131*   < > 134*   < > 134* 135 134* 142 136  K  --    < >  --  4.5   < > 4.0   < > 4.7 4.6 4.8 4.1 4.2  CL  --    < >  --  100  --  104  --  104 103  --  106 104  CO2  --    < >  --  17*  --  19*  --  20* 19*  --  20* 20*  GLUCOSE  --    < >  --  119*  --  115*  --  125* 133*  --  121* 113*  BUN  --    < >  --  15  --  20  --  16 13  --  15 12  CREATININE  --    < >  --  1.12*  --  0.94  --  0.79 0.63  --  0.77 0.62  CALCIUM  --    < >  --  8.8*  --  8.3*  --  8.7* 9.0  --  9.3 9.1  MG 2.2  --  2.2 2.2  --  2.1  --  2.2  --   --   --    --   PHOS 5.6*  --  4.9* 5.3*  --  4.5  --  3.8  --   --   --   --    < > =  values in this interval not displayed.   GFR: Estimated Creatinine Clearance: 94.9 mL/min (by C-G formula based on SCr of 0.62 mg/dL). Liver Function Tests: Recent Labs  Lab 08/29/23 1000  AST 30  ALT 11  ALKPHOS 140*  BILITOT 0.6  PROT 8.1  ALBUMIN 3.2*   Recent Labs  Lab 09/01/23 0216  LIPASE 18   No results for input(s): "AMMONIA" in the last 168 hours. Coagulation Profile: No results for input(s): "INR", "PROTIME" in the last 168 hours. Cardiac Enzymes: No results for input(s): "CKTOTAL", "CKMB", "CKMBINDEX", "TROPONINI" in the last 168 hours. BNP (last 3 results) No results for input(s): "PROBNP" in the last 8760 hours. HbA1C: No results for input(s): "HGBA1C" in the last 72 hours. CBG: Recent Labs  Lab 09/03/23 1635 09/03/23 2042 09/04/23 0041 09/04/23 0425 09/04/23 0907  GLUCAP 85 110* 92 116* 89   Lipid Profile: Recent Labs    09/02/23 0349  TRIG 89   Thyroid Function Tests: No results for input(s): "TSH", "T4TOTAL", "FREET4", "T3FREE", "THYROIDAB" in the last 72 hours. Anemia Panel: No results for input(s): "VITAMINB12", "FOLATE", "FERRITIN", "TIBC", "IRON", "RETICCTPCT" in the last 72 hours. Sepsis Labs: Recent Labs  Lab 08/29/23 1843 08/29/23 2009 08/29/23 2321 08/30/23 0856 08/30/23 1145 08/31/23 0233 08/31/23 1021  PROCALCITON 0.44  --   --  0.38  --  0.30  --   LATICACIDVEN 1.8   < > 1.3 1.6 2.1*  --  1.2   < > = values in this interval not displayed.    Recent Results (from the past 240 hours)  Resp panel by RT-PCR (RSV, Flu A&B, Covid) Anterior Nasal Swab     Status: None   Collection Time: 08/29/23 10:13 AM   Specimen: Anterior Nasal Swab  Result Value Ref Range Status   SARS Coronavirus 2 by RT PCR NEGATIVE NEGATIVE Final    Comment: (NOTE) SARS-CoV-2 target nucleic acids are NOT DETECTED.  The SARS-CoV-2 RNA is generally detectable in upper  respiratory specimens during the acute phase of infection. The lowest concentration of SARS-CoV-2 viral copies this assay can detect is 138 copies/mL. A negative result does not preclude SARS-Cov-2 infection and should not be used as the sole basis for treatment or other patient management decisions. A negative result may occur with  improper specimen collection/handling, submission of specimen other than nasopharyngeal swab, presence of viral mutation(s) within the areas targeted by this assay, and inadequate number of viral copies(<138 copies/mL). A negative result must be combined with clinical observations, patient history, and epidemiological information. The expected result is Negative.  Fact Sheet for Patients:  BloggerCourse.com  Fact Sheet for Healthcare Providers:  SeriousBroker.it  This test is no t yet approved or cleared by the [place] FDA and  has been authorized for detection and/or diagnosis of SARS-CoV-2 by FDA under an Emergency Use Authorization (EUA). This EUA will remain  in effect (meaning this test can be used) for the duration of the COVID-19 declaration under Section 564(b)(1) of the Act, 21 U.S.C.section 360bbb-3(b)(1), unless the authorization is terminated  or revoked sooner.       Influenza A by PCR NEGATIVE NEGATIVE Final   Influenza B by PCR NEGATIVE NEGATIVE Final    Comment: (NOTE) The Xpert Xpress SARS-CoV-2/FLU/RSV plus assay is intended as an aid in the diagnosis of influenza from Nasopharyngeal swab specimens and should not be used as a sole basis for treatment. Nasal washings and aspirates are unacceptable for Xpert Xpress SARS-CoV-2/FLU/RSV testing.  Fact Sheet for  Patients: BloggerCourse.com  Fact Sheet for Healthcare Providers: SeriousBroker.it  This test is not yet approved or cleared by the [place] FDA and has been  authorized for detection and/or diagnosis of SARS-CoV-2 by FDA under an Emergency Use Authorization (EUA). This EUA will remain in effect (meaning this test can be used) for the duration of the COVID-19 declaration under Section 564(b)(1) of the Act, 21 U.S.C. section 360bbb-3(b)(1), unless the authorization is terminated or revoked.     Resp Syncytial Virus by PCR NEGATIVE NEGATIVE Final    Comment: (NOTE) Fact Sheet for Patients: BloggerCourse.com  Fact Sheet for Healthcare Providers: SeriousBroker.it  This test is not yet approved or cleared by the [place] FDA and has been authorized for detection and/or diagnosis of SARS-CoV-2 by FDA under an Emergency Use Authorization (EUA). This EUA will remain in effect (meaning this test can be used) for the duration of the COVID-19 declaration under Section 564(b)(1) of the Act, 21 U.S.C. section 360bbb-3(b)(1), unless the authorization is terminated or revoked.  Performed at [facility], [address], [place], [place] [address]   Culture, blood (Routine X 2) w Reflex to ID Panel     Status: None   Collection Time: 08/29/23 10:25 AM   Specimen: Left Antecubital; Blood  Result Value Ref Range Status   Specimen Description LEFT ANTECUBITAL  Final   Special Requests BOTTLES DRAWN AEROBIC AND ANAEROBIC  Final   Culture   Final    NO GROWTH 5 DAYS Performed at [facility], [address] [place], [place] [address]    Report Status 09/03/2023 FINAL  Final  Culture, blood (Routine X 2) w Reflex to ID Panel     Status: None   Collection Time: 08/29/23  6:12 PM   Specimen: Right Antecubital; Blood  Result Value Ref Range Status   Specimen Description RIGHT ANTECUBITAL  Final   Special Requests BOTTLES DRAWN AEROBIC AND ANAEROBIC  Final   Culture   Final    NO GROWTH 5 DAYS Performed at [facility], [address] [place], [place] [address]    Report Status 09/03/2023 FINAL   Final  MRSA Next Gen by PCR, Nasal     Status: Abnormal   Collection Time: 08/30/23  1:01 AM   Specimen: Urine, Catheterized; Nasal Swab  Result Value Ref Range Status   MRSA by PCR Next Gen DETECTED (A) NOT DETECTED Final    Comment: RESULT CALLED TO, READ BACK BY AND VERIFIED WITH: D WASHINGTON RN 08/30/2023 @ 0409 BY AB (NOTE) The GeneXpert MRSA Assay (FDA approved for NASAL specimens only), is one component of a comprehensive MRSA colonization surveillance program. It is not intended to diagnose MRSA infection nor to guide or monitor treatment for MRSA infections. Test performance is not FDA approved in patients less than 70 years old. Performed at [facility], [address] [place], [place] [address]   Respiratory (~20 pathogens) panel by PCR     Status: Abnormal   Collection Time: 08/30/23  2:09 PM   Specimen: Nasopharyngeal Swab; Respiratory  Result Value Ref Range Status   Adenovirus NOT DETECTED NOT DETECTED Final   Coronavirus 229E NOT DETECTED NOT DETECTED Final    Comment: (NOTE) The Coronavirus on the Respiratory Panel, DOES NOT test for the novel  Coronavirus (2019 nCoV)    Coronavirus HKU1 NOT DETECTED NOT DETECTED Final   Coronavirus NL63 NOT DETECTED NOT DETECTED Final   Coronavirus OC43 NOT DETECTED NOT DETECTED Final   Metapneumovirus  NOT DETECTED NOT DETECTED Final   Rhinovirus / Enterovirus DETECTED (A) NOT DETECTED Final   Influenza A NOT DETECTED NOT DETECTED Final   Influenza B NOT DETECTED NOT DETECTED Final   Parainfluenza Virus 1 NOT DETECTED NOT DETECTED Final   Parainfluenza Virus 2 NOT DETECTED NOT DETECTED Final   Parainfluenza Virus 3 NOT DETECTED NOT DETECTED Final   Parainfluenza Virus 4 NOT DETECTED NOT DETECTED Final   Respiratory Syncytial Virus NOT DETECTED NOT DETECTED Final   Bordetella pertussis NOT DETECTED NOT DETECTED Final   Bordetella Parapertussis NOT DETECTED NOT DETECTED Final   Chlamydophila pneumoniae NOT DETECTED NOT  DETECTED Final   Mycoplasma pneumoniae NOT DETECTED NOT DETECTED Final    Comment: Performed at [facility], [address] [place], [place] [address]  Culture, Respiratory w Gram Stain     Status: None   Collection Time: 08/30/23 10:51 PM   Specimen: Tracheal Aspirate; Respiratory  Result Value Ref Range Status   Specimen Description TRACHEAL ASPIRATE  Final   Special Requests NONE  Final   Gram Stain   Final    NO WBC SEEN NO ORGANISMS SEEN Performed at [facility], [address], [place], [place] [address]    Culture RARE CANDIDA ALBICANS  Final   Report Status 09/02/2023 FINAL  Final         Radiology Studies: ECHOCARDIOGRAM LIMITED Result Date: 09/04/2023    ECHOCARDIOGRAM LIMITED REPORT   Patient Name:   CELES DEDIC Date of Exam: 09/04/2023 Medical Rec #:  [id number]        Height:       61.0 in Accession #:    [id number]       Weight:       211.4 lb Date of Birth:  [birth year]        BSA:          1.934 m Patient Age:    42 years         BP:           118/82 mmHg Patient Gender: F                HR:           86 bpm. Exam Location:  Inpatient Procedure: Limited Echo, Limited Color Doppler and Cardiac Doppler (Both            Spectral and Color Flow Doppler were utilized during procedure). Indications:    NSTEMI  History:        Patient has prior history of Echocardiogram examinations, most                 recent 08/29/2023.  Sonographer:    Amy Chionchio Referring Phys: [id number] Dorcas Carrow IMPRESSIONS  1. Left ventricular ejection fraction, by estimation, is 60 to 65%. The left ventricle has normal function. The left ventricle has no regional wall motion abnormalities.  2. Right ventricular systolic function is normal. The right ventricular size is normal.  3. The mitral valve is grossly normal. No evidence of mitral valve regurgitation. No evidence of mitral stenosis.  4. The aortic valve was not well visualized. Aortic valve regurgitation is not visualized. No  aortic stenosis is present.  5. The inferior vena cava is normal in size with greater than 50% respiratory variability, suggesting right atrial pressure of 3 mmHg. FINDINGS  Left Ventricle: Left ventricular ejection fraction, by estimation, is 60 to 65%. The left ventricle has normal function. The  left ventricle has no regional wall motion abnormalities. The left ventricular internal cavity size was normal in size. There is  no left ventricular hypertrophy. Right Ventricle: The right ventricular size is normal. No increase in right ventricular wall thickness. Right ventricular systolic function is normal. Left Atrium: Left atrial size was normal in size. Right Atrium: Right atrial size was normal in size. Pericardium: There is no evidence of pericardial effusion. Mitral Valve: The mitral valve is grossly normal. No evidence of mitral valve stenosis. Tricuspid Valve: The tricuspid valve is not well visualized. Tricuspid valve regurgitation is not demonstrated. No evidence of tricuspid stenosis. Aortic Valve: The aortic valve was not well visualized. Aortic valve regurgitation is not visualized. No aortic stenosis is present. Pulmonic Valve: The pulmonic valve was not assessed. Pulmonic valve regurgitation is not visualized. No evidence of pulmonic stenosis. Aorta: The aortic root is normal in size and structure. Venous: The inferior vena cava is normal in size with greater than 50% respiratory variability, suggesting right atrial pressure of 3 mmHg. IAS/Shunts: No atrial level shunt detected by color flow Doppler.  LV Volumes (MOD) LV vol d, MOD A2C: 37.1 ml LV vol d, MOD A4C: 48.9 ml LV vol s, MOD A2C: 12.1 ml LV vol s, MOD A4C: 14.8 ml LV SV MOD A2C:     25.0 ml LV SV MOD A4C:     48.9 ml LV SV MOD BP:      30.9 ml RIGHT VENTRICLE         IVC TAPSE (M-mode): 2.5 cm  IVC diam: 2.60 cm TRICUSPID VALVE TR Peak grad:   12.7 mmHg TR Vmax:        178.00 cm/s Aditya Sabharwal Electronically signed by Dorthula Nettles  Signature Date/Time: 09/04/2023/1:17:26 PM    Final         Scheduled Meds:  arformoterol  15 mcg Nebulization BID   aspirin  81 mg Oral Daily   atorvastatin  40 mg Oral Daily   brivaracetam  100 mg Oral BID   budesonide (PULMICORT) nebulizer solution  0.5 mg Nebulization BID   busPIRone  5 mg Oral BID   docusate sodium  100 mg Oral BID   enoxaparin (LOVENOX) injection  40 mg Subcutaneous Q24H   feeding supplement  237 mL Oral BID BM   metoprolol tartrate  25 mg Oral BID   multivitamin with minerals  1 tablet Oral Daily   nortriptyline  25 mg Oral QHS   OXcarbazepine  600 mg Oral q morning   And   OXcarbazepine  900 mg Oral QPM   polyethylene glycol  17 g Per Tube Daily   revefenacin  175 mcg Nebulization Daily   senna  1 tablet Oral BID   SUMAtriptan  50 mg Oral Once   zonisamide  500 mg Oral QPM   And   zonisamide  100 mg Oral q morning   Continuous Infusions:   LOS: 6 days    Time spent: 40 minutes    Dorcas Carrow, MD [facility]

## 2023-09-05 DIAGNOSIS — J9601 Acute respiratory failure with hypoxia: Secondary | ICD-10-CM | POA: Diagnosis not present

## 2023-09-05 MED ORDER — METOPROLOL TARTRATE 25 MG PO TABS: 25.0000 mg | ORAL_TABLET | Freq: Two times a day (BID) | ORAL | 0 refills | Status: DC

## 2023-09-05 MED ORDER — ATORVASTATIN CALCIUM 40 MG PO TABS: 40.0000 mg | ORAL_TABLET | Freq: Every day | ORAL | 0 refills | Status: DC

## 2023-09-05 MED ORDER — ASPIRIN 81 MG PO CHEW: 81.0000 mg | CHEWABLE_TABLET | Freq: Every day | ORAL | 0 refills | Status: AC

## 2023-09-05 NOTE — Progress Notes (Signed)
Occupational Therapy Treatment Patient Details Name: [name] MRN: [id number] DOB: [birth year] Today's Date: 09/05/2023   History of present illness 43 y.o. female presents to [facility] hospital on 08/29/2023 as a transfer from [place] due to respiratory distress, intubated on 2/7. Pt found to have rhinovirus, also with concern for NSTEMI. Extubated 2/10. 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   OT comments  Patient demonstrating good gains with self care. Patient able to stand at sink for grooming and bathing, perform toilet transfers and LB dressing with supervision. Patient able to perform mobility with and without RW and supervision. Discharge recommendations continue to be appropriate.       If plan is discharge home, recommend the following:  A little help with walking and/or transfers;A little help with bathing/dressing/bathroom;Assistance with cooking/housework;Assist for transportation;Help with stairs or ramp for entrance;Direct supervision/assist for medications management;Direct supervision/assist for financial management   Equipment Recommendations  Other (comment) (RW)    Recommendations for Other Services      Precautions / Restrictions Precautions Precautions: Fall;Other (comment) Recall of Precautions/Restrictions: Impaired Precaution/Restrictions Comments: needs cues for hand placement during transfers; Droplet precs Restrictions Weight Bearing Restrictions Per Provider Order: No       Mobility Bed Mobility Overal bed mobility: Needs Assistance Bed Mobility: Sit to Supine     Supine to sit: Supervision, HOB elevated, Used rails     General bed mobility comments: no assist this date, use of rails    Transfers Overall transfer level: Needs assistance Equipment used: Rolling walker (2 wheels), None Transfers: Sit to/from Stand Sit to Stand:  Supervision           General transfer comment: ambulated to bathroom with RW and transferred to toilet without RW and ambulated to recliner without RW     Balance Overall balance assessment: Needs assistance Sitting-balance support: No upper extremity supported, Feet supported Sitting balance-Leahy Scale: Good     Standing balance support: Single extremity supported, No upper extremity supported, Bilateral upper extremity supported, During functional activity Standing balance-Leahy Scale: Fair Standing balance comment: fair standing at sink for self care tasks                           ADL either performed or assessed with clinical judgement   ADL Overall ADL's : Needs assistance/impaired     Grooming: Wash/dry hands;Wash/dry face;Oral care;Supervision/safety;Standing Grooming Details (indicate cue type and reason): at sink Upper Body Bathing: Supervision/ safety;Standing   Lower Body Bathing: Supervison/ safety;Sit to/from stand   Upper Body Dressing : Set up;Sitting Upper Body Dressing Details (indicate cue type and reason): gown for back Lower Body Dressing: Supervision/safety;Sitting/lateral leans Lower Body Dressing Details (indicate cue type and reason): to change socks Toilet Transfer: Supervision/safety;Regular Toilet;Grab bars Toilet Transfer Details (indicate cue type and reason): ambulated from sink to toilet with no walker and used rails to assist with standing Toileting- Clothing Manipulation and Hygiene: Modified independent;Sitting/lateral lean              Extremity/Trunk Assessment Upper Extremity Assessment Upper Extremity Assessment: Generalized weakness            Vision       Perception     Praxis     Communication Communication Communication: No apparent difficulties   Cognition Arousal: Alert Behavior During Therapy: WFL for tasks assessed/performed, Impulsive  Following  commands: Impaired Following commands impaired: Follows one step commands with increased time      Cueing   Cueing Techniques: Verbal cues  Exercises      Shoulder Instructions       General Comments      Pertinent Vitals/ Pain       Pain Assessment Pain Assessment: No/denies pain  Home Living Family/patient expects to be discharged to:: Private residence Living Arrangements: Alone                                      Prior Functioning/Environment              Frequency  Min 1X/week        Progress Toward Goals  OT Goals(current goals can now be found in the care plan section)  Progress towards OT goals: Progressing toward goals  Acute Rehab OT Goals Patient Stated Goal: go home OT Goal Formulation: With patient Time For Goal Achievement: 09/17/23 Potential to Achieve Goals: Good ADL Goals Pt Will Perform Grooming: with contact guard assist;standing Pt Will Perform Upper Body Bathing: with set-up;sitting Pt Will Perform Lower Body Bathing: with set-up;sit to/from stand;with supervision Pt Will Transfer to Toilet: with supervision;ambulating Additional ADL Goal #1: Pt will perform 3 step grooming or ADL task with min cues for self pacing as needed.  Plan      Co-evaluation                 AM-PAC OT "6 Clicks" Daily Activity     Outcome Measure   Help from another person eating meals?: None Help from another person taking care of personal grooming?: A Little Help from another person toileting, which includes using toliet, bedpan, or urinal?: A Little Help from another person bathing (including washing, rinsing, drying)?: A Little Help from another person to put on and taking off regular upper body clothing?: A Little Help from another person to put on and taking off regular lower body clothing?: A Little 6 Click Score: 19    End of Session Equipment Utilized During Treatment: Gait belt;Rolling walker (2 wheels)  OT Visit  Diagnosis: Unsteadiness on feet (R26.81);Muscle weakness (generalized) (M62.81);Other symptoms and signs involving cognitive function;Other (comment)   Activity Tolerance Patient tolerated treatment well   Patient Left in chair;with call bell/phone within reach;with chair alarm set   Nurse Communication Mobility status        Time: 1610-9604 OT Time Calculation (min): 26 min  Charges: OT General Charges $OT Visit: 1 Visit OT Treatments $Self Care/Home Management : 23-37 mins  Alfonse Flavors, OTA Acute Rehabilitation Services  Office [phone]   Dewain Penning 09/05/2023, 1:49 PM

## 2023-09-05 NOTE — Plan of Care (Signed)
  Problem: Education: Goal: Knowledge of General Education information will improve Description: Including pain rating scale, medication(s)/side effects and non-pharmacologic comfort measures Outcome: Adequate for Discharge   Problem: Health Behavior/Discharge Planning: Goal: Ability to manage health-related needs will improve Outcome: Adequate for Discharge   Problem: Clinical Measurements: Goal: Ability to maintain clinical measurements within normal limits will improve Outcome: Adequate for Discharge Goal: Will remain free from infection Outcome: Adequate for Discharge Goal: Diagnostic test results will improve Outcome: Adequate for Discharge Goal: Respiratory complications will improve Outcome: Adequate for Discharge Goal: Cardiovascular complication will be avoided Outcome: Adequate for Discharge   Problem: Activity: Goal: Risk for activity intolerance will decrease Outcome: Adequate for Discharge   Problem: Nutrition: Goal: Adequate nutrition will be maintained Outcome: Adequate for Discharge   Problem: Coping: Goal: Level of anxiety will decrease Outcome: Adequate for Discharge   Problem: Elimination: Goal: Will not experience complications related to bowel motility Outcome: Adequate for Discharge Goal: Will not experience complications related to urinary retention Outcome: Adequate for Discharge   Problem: Pain Managment: Goal: General experience of comfort will improve and/or be controlled Outcome: Adequate for Discharge   Problem: Safety: Goal: Ability to remain free from injury will improve Outcome: Adequate for Discharge   Problem: Skin Integrity: Goal: Risk for impaired skin integrity will decrease Outcome: Adequate for Discharge   Problem: Activity: Goal: Ability to tolerate increased activity will improve Outcome: Adequate for Discharge    Pt discharged to home. AVS reviewed all follow up questions answered. IV removed. Belongings returned. BP  127/79 (BP Location: Left Arm)   Pulse 85   Temp 98.8 F (37.1 C) (Oral)   Resp 15   Ht 5\' 1"  (1.549 m)   Wt 95.9 kg   SpO2 100%   BMI 39.95 kg/m  Reva Bores 10:39 AM 09/05/23

## 2023-09-05 NOTE — Plan of Care (Signed)
Problem: Education: Goal: Knowledge of General Education information will improve Description: Including pain rating scale, medication(s)/side effects and non-pharmacologic comfort measures Outcome: Progressing   Problem: Health Behavior/Discharge Planning: Goal: Ability to manage health-related needs will improve Outcome: Progressing   Problem: Clinical Measurements: Goal: Ability to maintain clinical measurements within normal limits will improve Outcome: Progressing Goal: Will remain free from infection Outcome: Progressing Goal: Diagnostic test results will improve Outcome: Progressing Goal: Respiratory complications will improve Outcome: Progressing Goal: Cardiovascular complication will be avoided Outcome: Progressing   Problem: Activity: Goal: Risk for activity intolerance will decrease Outcome: Progressing   Problem: Nutrition: Goal: Adequate nutrition will be maintained Outcome: Progressing   Problem: Coping: Goal: Level of anxiety will decrease Outcome: Progressing   Problem: Elimination: Goal: Will not experience complications related to bowel motility Outcome: Progressing Goal: Will not experience complications related to urinary retention Outcome: Progressing   Problem: Pain Managment: Goal: General experience of comfort will improve and/or be controlled Outcome: Progressing   Problem: Safety: Goal: Ability to remain free from injury will improve Outcome: Progressing   Problem: Skin Integrity: Goal: Risk for impaired skin integrity will decrease Outcome: Progressing   Problem: Activity: Goal: Ability to tolerate increased activity will improve Outcome: Progressing   Problem: Respiratory: Goal: Ability to maintain a clear airway and adequate ventilation will improve Outcome: Progressing   Problem: Role Relationship: Goal: Method of communication will improve Outcome: Progressing   Problem: Education: Goal: Ability to describe self-care  measures that may prevent or decrease complications (Diabetes Survival Skills Education) will improve Outcome: Progressing Goal: Individualized Educational Video(s) Outcome: Progressing   Problem: Coping: Goal: Ability to adjust to condition or change in health will improve Outcome: Progressing   Problem: Fluid Volume: Goal: Ability to maintain a balanced intake and output will improve Outcome: Progressing   Problem: Health Behavior/Discharge Planning: Goal: Ability to identify and utilize available resources and services will improve Outcome: Progressing Goal: Ability to manage health-related needs will improve Outcome: Progressing   Problem: Metabolic: Goal: Ability to maintain appropriate glucose levels will improve Outcome: Progressing   Problem: Nutritional: Goal: Maintenance of adequate nutrition will improve Outcome: Progressing Goal: Progress toward achieving an optimal weight will improve Outcome: Progressing   Problem: Skin Integrity: Goal: Risk for impaired skin integrity will decrease Outcome: Progressing   Problem: Tissue Perfusion: Goal: Adequacy of tissue perfusion will improve Outcome: Progressing

## 2023-09-05 NOTE — TOC Initial Note (Signed)
Transition of Care (TOC) - Initial/Assessment Note   Spoke to patient at bedside. Patient from home alone but plans on staying with her mother at discharge at [address] , [place]. Phone number on face sheet is patient's cell phone and best number to contact her at.   Discussed HHPT and rolling walker. Patient in agreement. No preference , Kandee Keen with [facility] accepted. Per nurse patient is being discharged to discharge lounge . Mitch with [employer] will have walker delivered to discharge lounge nurse aware  Patient Details  Name: [name] MRN: [id number] Date of Birth: 16-Jul-1981  Transition of Care [facility]) CM/SW Contact:    Kingsley Plan, RN Phone Number: 09/05/2023, 10:51 AM  Clinical Narrative:                   Expected Discharge Plan: Home w Home Health Services Barriers to Discharge: No Barriers Identified   Patient Goals and CMS Choice Patient states their goals for this hospitalization and ongoing recovery are:: to return hom, will stay with her mother for one week CMS Medicare.gov Compare Post Acute Care list provided to:: Patient Choice offered to / list presented to : Patient      Expected Discharge Plan and Services   Discharge Planning Services: CM Consult Post Acute Care Choice: Home Health Living arrangements for the past 2 months: Apartment Expected Discharge Date: 09/05/23               DME Arranged: Dan Humphreys rolling DME Agency: [facility] Date DME Agency Contacted: 09/05/23 Time DME Agency Contacted: 1050 Representative spoke with at DME Agency: mitch HH Arranged: PT HH Agency: [facility] Date [facility] Agency Contacted: 09/05/23 Time HH Agency Contacted: 1050 Representative spoke with at [facility] Agency: Kandee Keen  Prior Living Arrangements/Services Living arrangements for the past 2 months: Apartment Lives with:: Self Patient language and need for interpreter reviewed:: Yes Do you feel safe going back to the place where you live?: Yes      Need  for Family Participation in Patient Care: Yes (Comment) Care giver support system in place?: Yes (comment)   Criminal Activity/Legal Involvement Pertinent to Current Situation/Hospitalization: No - Comment as needed  Activities of Daily Living   ADL Screening (condition at time of admission) Independently performs ADLs?: Yes (appropriate for developmental age) Is the patient deaf or have difficulty hearing?: No Does the patient have difficulty seeing, even when wearing glasses/contacts?: No Does the patient have difficulty concentrating, remembering, or making decisions?: No  Permission Sought/Granted   Permission granted to share information with : Yes, Verbal Permission Granted     Permission granted to share info w AGENCY: [facility]        Emotional Assessment Appearance:: Appears stated age Attitude/Demeanor/Rapport: Engaged Affect (typically observed): Accepting Orientation: : Oriented to Self, Oriented to Place, Oriented to  Time, Oriented to Situation Alcohol / Substance Use: Not Applicable Psych Involvement: No (comment)  Admission diagnosis:  Hypoxia [R09.02] Elevated troponin [R79.89] Acute respiratory failure with hypoxia (HCC) [J96.01] Patient Active Problem List   Diagnosis Date Noted   Elevated troponin 08/29/2023   Acute hypoxemic respiratory failure (HCC) 08/29/2023   Acute respiratory failure with hypoxia (HCC) 08/29/2023   Wrist pain, acute, left 08/14/2023   Vaginal candida 05/12/2023   Morbid obesity (HCC) 05/12/2023   Encounter for immunization 05/12/2023   Low sodium levels 05/12/2023   GAD (generalized anxiety disorder) 03/08/2023   Elevated alkaline phosphatase level 11/11/2022   Tinea cruris 09/18/2022  Onychomycosis 09/18/2022   Tinea pedis of both feet 09/18/2022   Constipation 08/12/2022   Encounter for initial prescription of injectable contraceptive 07/09/2022   Pregnancy examination or test, negative result 07/09/2022    Assistance needed with transportation 05/06/2022   Encounter for support and coordination of transition of care 01/12/2022   Forgetfulness 11/19/2021   Right shoulder pain 08/10/2021   Vision loss 05/08/2021   Post traumatic stress disorder (PTSD) 04/28/2020   Post-traumatic stress 04/27/2020   Depression, major, single episode, severe (HCC) 04/27/2020   PCR DNA positive for HSV2 07/25/2018   Localization-related (focal) (partial) idiopathic epilepsy and epileptic syndromes with seizures of localized onset, intractable, without status epilepticus (HCC) 11/17/2017   Seizure (HCC) 11/19/2015   Amenorrhea 09/28/2011   Vitamin D deficiency 09/06/2011   Headache 03/10/2011   Convulsions (HCC) 10/26/2008   Current smoker 12/10/2007   Depression 12/10/2007   PCP:  Kerri Perches, MD Pharmacy:   [facility] [phone] - [place], [place] - [address] [place] [address] Phone: [phone] Fax: [phone]  [facility] [place], [place] - [address] [place] [address] Phone: [phone] Fax: [phone]     Social Drivers of Health (SDOH) Social History: SDOH Screenings   Food Insecurity: No Food Insecurity (08/30/2023)  Housing: Low Risk  (08/30/2023)  Transportation Needs: No Transportation Needs (08/30/2023)  Utilities: Not At Risk (08/30/2023)  Alcohol Screen: Low Risk  (07/30/2023)  Depression (PHQ2-9): Low Risk  (08/14/2023)  Recent Concern: Depression (PHQ2-9) - Medium Risk (07/30/2023)  Financial Resource Strain: Low Risk  (07/30/2023)  Physical Activity: Inactive (07/30/2023)  Social Connections: Socially Isolated (09/04/2023)  Stress: No Stress Concern Present (07/30/2023)  Tobacco Use: High Risk (08/29/2023)  Health Literacy: Adequate Health Literacy (07/30/2023)   SDOH Interventions:     Readmission Risk Interventions     No data to display

## 2023-09-05 NOTE — Discharge Summary (Signed)
Physician Discharge Summary  [name] MVH:[id number] DOB: 06-Jun-1981 DOA: 08/29/2023  PCP: Kerri Perches, MD  Admit date: 08/29/2023 Discharge date: 09/05/2023  Admitted From: Home Disposition: Home Home with home health  Recommendations for Outpatient Follow-up:  Follow up with PCP in 1-2 weeks Please obtain BMP/CBC in one week   Home Health: PT/OT Equipment/Devices: N/A  Discharge Condition: Stable CODE STATUS: Full code  Diet recommendation: Regular diet  Discharge summary: 43 year old with history of seizures, left temporal lobe encephalocele s/p resection, chronic hyponatremia, smoker, marijuana use, depression who presented to [facility] on 2/7 with shortness of breath, chest pain,  chills,  fatigue and dry cough for 3 days.  In the emergency room she was tachycardic and tachypneic with respiratory rate of 40.  Initial temperature 99.  Initially on nasal cannula oxygen.  COVID flu and RSV negative.  Mildly elevated troponins.  EKG nonischemic.  CTA chest with no PE, diffuse groundglass densities concerning for pneumonia.  WBC 14.  Procalcitonin 0.44.  Patient was started on Rocephin and azithromycin, nasal cannula oxygen.  Cardiology consulted.  Echocardiogram with normal ejection fraction. Started on IV heparin for non-STEMI.  Patient subsequently decompensated and placed on BiPAP.  She was still tachypneic so she was intubated and placed on vent and transferred to [facility].   2/7 admitted to [facility] with chest pain and SOB Intubated  2/8 transferred to ICU at  [facility].  Continuing antibiotics, IV diuresis, systemic steroids, following up on cultures remains on heparin. RVP positive for rhinovirus, ustrep neg, MRSA PCR pos. Sig bronchospasm. BDs and ICS optimized.  2/9 still having some vent synchrony difficulty. Increased prop ceiling to 75. Changed vent to PCV seems to be more comfortable. Metabolic acidosis improved. Still w/ sig wheeze. Change  steroids to 40mg  bid  2/10, extubated to nasal cannula oxygen. 2/11, transferred to medical floor. 2/14, all symptoms improved.  On room air.  Echocardiogram with normal ejection fraction, no evidence of regional wall motion abnormalities.  Stable to discharge home.  Acute hypoxemic respiratory failure secondary to rhinovirus pneumonia, superadded community-acquired pneumonia. Patient required mechanical ventilation and nasal canula oxygen and now on room air.  Patient completed 5 days of Rocephin and azithromycin.   Rhinovirus was positive. Patient was also on a steroid for bronchospasm.  Symptoms completely improved.  Will discontinue further steroids.   Non-STEMI: Troponin elevated and flat and trended down.  Received 48 hours of IV heparin.  Echocardiogram was grossly normal with some wall motion abnormality.  Repeat echocardiogram without any evidence of regional wall motion abnormalities. Discussed with cardiology.  They will schedule outpatient follow-up.  Currently recommended to continue aspirin and Lipitor.  Will add metoprolol.   History of epilepsy: On Briviact, zonisamide and Trileptal.  No new seizure. Chronic hyponatremia: Stable at around baseline. Smoker: Counseled to quit.  Nicotine patch. Anxiety depression: On BuSpar and nortriptyline.  Medically stable for discharge.   Discharge Diagnoses:  Principal Problem:   Acute hypoxemic respiratory failure (HCC) Active Problems:   Convulsions (HCC)   Seizure (HCC)   GAD (generalized anxiety disorder)   Morbid obesity (HCC)   Elevated troponin   Acute respiratory failure with hypoxia [facility])    Discharge Instructions  Discharge Instructions     Diet - low sodium heart healthy   Complete by: As directed    Increase activity slowly   Complete by: As directed       Allergies as of 09/05/2023  Reactions   Morphine Other (See Comments)   Unknown Reaction         Medication List     TAKE these  medications    aspirin 81 MG chewable tablet Chew 1 tablet (81 mg total) by mouth daily. Start taking on: September 06, 2023   atorvastatin 40 MG tablet Commonly known as: LIPITOR Take 1 tablet (40 mg total) by mouth daily. Start taking on: September 06, 2023   Briviact 100 MG Tabs tablet Generic drug: brivaracetam TAKE 1 TABLET BY MOUTH TWICE DAILY   busPIRone 5 MG tablet Commonly known as: BUSPAR TAKE 1 TABLET(5 MG) BY MOUTH TWICE DAILY   Emgality 120 MG/ML Soaj Generic drug: Galcanezumab-gnlm Inject 1 pen  into the skin every 30 (thirty) days.   famciclovir 250 MG tablet Commonly known as: FAMVIR TAKE ONE (1) TABLET BY MOUTH TWICE DAILY   ketoconazole 2 % cream Commonly known as: NIZORAL Apply 1 Application topically 2 (two) times daily as needed for irritation.   medroxyPROGESTERone 150 MG/ML injection Commonly known as: DEPO-PROVERA ADMINISTER 1 ML(150 MG) IN THE MUSCLE EVERY 3 MONTHS   meloxicam 15 MG tablet Commonly known as: MOBIC Take 1 tablet (15 mg total) by mouth daily.   metoprolol tartrate 25 MG tablet Commonly known as: LOPRESSOR Take 1 tablet (25 mg total) by mouth 2 (two) times daily.   nortriptyline 25 MG capsule Commonly known as: PAMELOR TAKE 2 CAPSULES BY MOUTH EVERY NIGHT   oxcarbazepine 600 MG tablet Commonly known as: TRILEPTAL Take 1 tablet two times daily .   terbinafine 250 MG tablet Commonly known as: LAMISIL Take 1 tablet (250 mg total) by mouth daily.   tiZANidine 4 MG tablet Commonly known as: ZANAFLEX TAKE 1 TABLET BY MOUTH EVERY 6 HOURS AS NEEDED FOR MUSCLE SPASMS   triamcinolone 0.025 % ointment Commonly known as: KENALOG Apply 1 Application topically 2 (two) times daily as needed (itching).   Ubrelvy 100 MG Tabs Generic drug: Ubrogepant Take 1 tablet as needed at onset of migraine. Do not take more than 3 a week   Vitamin D (Ergocalciferol) 1.25 MG (50000 UNIT) Caps capsule Commonly known as: DRISDOL Take 1 capsule  (50,000 Units total) by mouth every 7 (seven) days.   zonisamide 100 MG capsule Commonly known as: ZONEGRAN TAKE ONE (1) CAPSULE BY MOUTH ONCE DAILY IN THE MORNING AND TAKE FIVE (5) CAPSULES IN THE EVENING        Follow-up Information     Furth, Cadence H, PA-C Follow up.   Specialty: Cardiology Why: Hospital follow-up with Cardiology scheduled for 09/18/2023 @ 1:00pm. Please arrive 15 minutes early for check-in. If this date/time does not work for you, please call our office to reschedule. Contact information: [address] [place] [address] [phone]                Allergies  Allergen Reactions   Morphine Other (See Comments)    Unknown Reaction      Consultations: Critical care Cardiology   Procedures/Studies: ECHOCARDIOGRAM LIMITED Result Date: 09/04/2023    ECHOCARDIOGRAM LIMITED REPORT   Patient Name:   [name] Date of Exam: 09/04/2023 Medical Rec #:  [id number]        Height:       61.0 in Accession #:    [id number]       Weight:       211.4 lb Date of Birth:  1981-04-08        BSA:  1.934 m Patient Age:    42 years         BP:           118/82 mmHg Patient Gender: F                HR:           86 bpm. Exam Location:  Inpatient Procedure: Limited Echo, Limited Color Doppler and Cardiac Doppler (Both            Spectral and Color Flow Doppler were utilized during procedure). Indications:    NSTEMI  History:        Patient has prior history of Echocardiogram examinations, most                 recent 08/29/2023.  Sonographer:    Amy Chionchio Referring Phys: [id number] Dorcas Carrow IMPRESSIONS  1. Left ventricular ejection fraction, by estimation, is 60 to 65%. The left ventricle has normal function. The left ventricle has no regional wall motion abnormalities.  2. Right ventricular systolic function is normal. The right ventricular size is normal.  3. The mitral valve is grossly normal. No evidence of mitral valve regurgitation. No evidence of mitral  stenosis.  4. The aortic valve was not well visualized. Aortic valve regurgitation is not visualized. No aortic stenosis is present.  5. The inferior vena cava is normal in size with greater than 50% respiratory variability, suggesting right atrial pressure of 3 mmHg. FINDINGS  Left Ventricle: Left ventricular ejection fraction, by estimation, is 60 to 65%. The left ventricle has normal function. The left ventricle has no regional wall motion abnormalities. The left ventricular internal cavity size was normal in size. There is  no left ventricular hypertrophy. Right Ventricle: The right ventricular size is normal. No increase in right ventricular wall thickness. Right ventricular systolic function is normal. Left Atrium: Left atrial size was normal in size. Right Atrium: Right atrial size was normal in size. Pericardium: There is no evidence of pericardial effusion. Mitral Valve: The mitral valve is grossly normal. No evidence of mitral valve stenosis. Tricuspid Valve: The tricuspid valve is not well visualized. Tricuspid valve regurgitation is not demonstrated. No evidence of tricuspid stenosis. Aortic Valve: The aortic valve was not well visualized. Aortic valve regurgitation is not visualized. No aortic stenosis is present. Pulmonic Valve: The pulmonic valve was not assessed. Pulmonic valve regurgitation is not visualized. No evidence of pulmonic stenosis. Aorta: The aortic root is normal in size and structure. Venous: The inferior vena cava is normal in size with greater than 50% respiratory variability, suggesting right atrial pressure of 3 mmHg. IAS/Shunts: No atrial level shunt detected by color flow Doppler.  LV Volumes (MOD) LV vol d, MOD A2C: 37.1 ml LV vol d, MOD A4C: 48.9 ml LV vol s, MOD A2C: 12.1 ml LV vol s, MOD A4C: 14.8 ml LV SV MOD A2C:     25.0 ml LV SV MOD A4C:     48.9 ml LV SV MOD BP:      30.9 ml RIGHT VENTRICLE         IVC TAPSE (M-mode): 2.5 cm  IVC diam: 2.60 cm TRICUSPID VALVE TR Peak  grad:   12.7 mmHg TR Vmax:        178.00 cm/s Aditya Sabharwal Electronically signed by Dorthula Nettles Signature Date/Time: 09/04/2023/1:17:26 PM    Final    DG Chest Port 1 View Result Date: 09/02/2023 CLINICAL DATA:  Acute respiratory failure EXAM: PORTABLE CHEST 1  VIEW COMPARISON:  09/01/2023 FINDINGS: Endotracheal tube and gastric catheter have been removed in the interval. Stable cardiomegaly is noted. Vascular congestion is again identified although somewhat improved when compared with the prior exam. Basilar atelectasis is noted right worse than left. No pneumothorax is seen. IMPRESSION: Improvement in vascular congestion when compared with the previous exam. Persistent bibasilar atelectasis is noted. Electronically Signed   By: Alcide Clever M.D.   On: 09/02/2023 10:14   DG Abd 1 View Result Date: 09/02/2023 CLINICAL DATA:  43 year old female with history of abdominal distension. EXAM: ABDOMEN - 1 VIEW COMPARISON:  08/31/2023. FINDINGS: Multiple prominent loops of gas-filled small bowel and colon. Distal rectal gas is noted. No definitive pneumoperitoneum confidently identified. IMPRESSION: 1. Nonspecific bowel gas pattern, as above, concerning for probable ileus. Electronically Signed   By: Trudie Reed M.D.   On: 09/02/2023 06:14   DG Chest Port 1 View Result Date: 09/01/2023 CLINICAL DATA:  [id number].  Pneumonia follow-up.  Ventilator dependent. EXAM: PORTABLE CHEST 1 VIEW COMPARISON:  Portable chest yesterday at 4:50 a.m. FINDINGS: 4:51 a.m. ETT tip is 4.3 cm from the carina. NGT curves to the left and superiorly within the stomach with the tip in the cardia. The heart is enlarged, unchanged. Central vascular prominence is slightly improved today but there are still heterogeneous interstitial and airspace opacities in the mid to lower lung fields which are not significantly changed. The lung apices remain clear. There are minimal pleural effusions. Mediastinum is stable. IMPRESSION: 1. Slight  improvement in central vascular prominence but no other significant change. 2. Heterogeneous interstitial and airspace opacities in the mid to lower lung fields are not significantly changed. 3. Minimal pleural effusions. 4. Stable cardiomegaly. 5. Support apparatus as above. Electronically Signed   By: Almira Bar M.D.   On: 09/01/2023 07:20   DG Chest Port 1 View Result Date: 08/31/2023 CLINICAL DATA:  Respiratory failure. Confirm OG tube placement EXAM: PORTABLE CHEST - 1 VIEW; ABDOMEN - 1 VIEW COMPARISON:  08/30/2023 FINDINGS: Chest: Unchanged cardiomegaly and pulmonary vascular congestion. Interval worsening of bibasilar airspace opacity likely combination of progressive atelectasis and pulmonary edema. Endotracheal tube tip located approximately 4.5 cm of the carina. Orogastric tube terminates in the left upper quadrant. Abdomen: Mild diffuse small bowel dilatation seen within the visualized upper abdomen. IMPRESSION: 1. Orogastric tube terminates in the left upper quadrant, texture aside of the stomach. 2. Unchanged cardiomegaly and pulmonary vascular congestion. Mild interval worsening of bilateral basilar opacities likely a combination of progressive pulmonary edema and atelectasis. Electronically Signed   By: Acquanetta Belling M.D.   On: 08/31/2023 08:49   DG Abd 1 View Result Date: 08/31/2023 CLINICAL DATA:  Respiratory failure. Confirm OG tube placement EXAM: PORTABLE CHEST - 1 VIEW; ABDOMEN - 1 VIEW COMPARISON:  08/30/2023 FINDINGS: Chest: Unchanged cardiomegaly and pulmonary vascular congestion. Interval worsening of bibasilar airspace opacity likely combination of progressive atelectasis and pulmonary edema. Endotracheal tube tip located approximately 4.5 cm of the carina. Orogastric tube terminates in the left upper quadrant. Abdomen: Mild diffuse small bowel dilatation seen within the visualized upper abdomen. IMPRESSION: 1. Orogastric tube terminates in the left upper quadrant, texture aside of  the stomach. 2. Unchanged cardiomegaly and pulmonary vascular congestion. Mild interval worsening of bilateral basilar opacities likely a combination of progressive pulmonary edema and atelectasis. Electronically Signed   By: Acquanetta Belling M.D.   On: 08/31/2023 08:49   DG Chest Portable 1 View Result Date: 08/30/2023 CLINICAL DATA:  ET tube placement  EXAM: PORTABLE CHEST 1 VIEW COMPARISON:  08/29/2023 FINDINGS: Endotracheal to is 3 cm above the carina. NG tube is in the stomach. Cardiomegaly with vascular congestion and worsening bilateral airspace disease. No visible significant effusions. No acute bony abnormality. IMPRESSION: Support devices in expected position as above. Worsening bilateral airspace disease could reflect edema or infection. Electronically Signed   By: Charlett Nose M.D.   On: 08/30/2023 00:23   DG CHEST PORT 1 VIEW Result Date: 08/29/2023 CLINICAL DATA:  Acute respiratory failure with hypoxia EXAM: PORTABLE CHEST 1 VIEW COMPARISON:  08/29/2023 FINDINGS: Cardiomegaly, vascular congestion. Bilateral interstitial and airspace opacities are similar prior study, favor edema. No visible effusions. No acute bony abnormality. IMPRESSION: Cardiomegaly.  Stable pulmonary edema pattern. Electronically Signed   By: Charlett Nose M.D.   On: 08/29/2023 21:24   CT Angio Chest Pulmonary Embolism (PE) W or WO Contrast Result Date: 08/29/2023 CLINICAL DATA:  Cough and shortness of breath.  Low-grade fever. EXAM: CT ANGIOGRAPHY CHEST WITH CONTRAST TECHNIQUE: Multidetector CT imaging of the chest was performed using the standard protocol during bolus administration of intravenous contrast. Multiplanar CT image reconstructions and MIPs were obtained to evaluate the vascular anatomy. RADIATION DOSE REDUCTION: This exam was performed according to the departmental dose-optimization program which includes automated exposure control, adjustment of the mA and/or kV according to patient size and/or use of iterative  reconstruction technique. CONTRAST:  75mL OMNIPAQUE IOHEXOL 350 MG/ML SOLN COMPARISON:  Chest x-ray from same day. FINDINGS: Cardiovascular: Satisfactory opacification of the pulmonary arteries to the segmental level. No evidence of pulmonary embolism. Normal heart size. No pericardial effusion. No thoracic aortic aneurysm or dissection. Mediastinum/Nodes: Prominent mediastinal lymph nodes measuring up to 1 cm in short axis are likely reactive. No enlarged axillary lymph nodes. Thyroid gland, trachea, and esophagus demonstrate no significant findings. Lungs/Pleura: Diffuse ground-glass densities throughout both lungs. Scattered smooth interlobular septal thickening. No pleural effusion or pneumothorax. Upper Abdomen: No acute abnormality. Musculoskeletal: No chest wall abnormality. No acute or significant osseous findings. Review of the MIP images confirms the above findings. IMPRESSION: 1. No evidence of pulmonary embolism. 2. Diffuse ground-glass densities throughout both lungs, concerning for pneumonia. Electronically Signed   By: Obie Dredge M.D.   On: 08/29/2023 17:06   ECHOCARDIOGRAM COMPLETE Result Date: 08/29/2023    ECHOCARDIOGRAM REPORT   Patient Name:   NILAM QUAKENBUSH Date of Exam: 08/29/2023 Medical Rec #:  [id number]        Height:       61.0 in Accession #:    [id number]       Weight:       207.2 lb Date of Birth:  06-Dec-1980        BSA:          1.917 m Patient Age:    42 years         BP:           146/93 mmHg Patient Gender: F                HR:           113 bpm. [facility]am [facility] Procedure: 2D Echo, Cardiac Doppler and Color Doppler Indications:    CHF-Acute Systolic I50.21 / Increased Troponins  History:        Patient has no prior history of Echocardiogram examinations.                 Risk Factors:Current Smoker.  Sonographer:  Celesta Gentile RCS Referring Phys: [id number] JOSEPH ZAMMIT IMPRESSIONS  1. Left ventricular ejection fraction, by estimation, is 65 to 70%. The left ventricle  has normal function. Left ventricular endocardial border not optimally defined to evaluate regional wall motion - cannot exclude basal posterolateral hypokinesis. There is mild asymmetric left ventricular hypertrophy of the septal segment. Left ventricular diastolic parameters are consistent with Grade I diastolic dysfunction (impaired relaxation).  2. Right ventricular systolic function is normal. The right ventricular size is normal. Tricuspid regurgitation signal is inadequate for assessing PA pressure.  3. The mitral valve is grossly normal. Trivial mitral valve regurgitation.  4. The aortic valve is tricuspid. Aortic valve regurgitation is not visualized. No aortic stenosis is present.  5. The inferior vena cava is normal in size with greater than 50% respiratory variability, suggesting right atrial pressure of 3 mmHg. Comparison(s): No prior Echocardiogram. FINDINGS  Left Ventricle: Left ventricular ejection fraction, by estimation, is 65 to 70%. The left ventricle has normal function. Left ventricular endocardial border not optimally defined to evaluate regional wall motion. The left ventricular internal cavity size was normal in size. There is mild asymmetric left ventricular hypertrophy of the septal segment. Left ventricular diastolic parameters are consistent with Grade I diastolic dysfunction (impaired relaxation). Right Ventricle: The right ventricular size is normal. No increase in right ventricular wall thickness. Right ventricular systolic function is normal. Tricuspid regurgitation signal is inadequate for assessing PA pressure. Left Atrium: Left atrial size was normal in size. Right Atrium: Right atrial size was normal in size. Pericardium: Trivial pericardial effusion is present. The pericardial effusion is surrounding the apex. Presence of epicardial fat layer. Mitral Valve: The mitral valve is grossly normal. Trivial mitral valve regurgitation. Tricuspid Valve: The tricuspid valve is grossly  normal. Tricuspid valve regurgitation is trivial. Aortic Valve: The aortic valve is tricuspid. There is mild aortic valve annular calcification. Aortic valve regurgitation is not visualized. No aortic stenosis is present. Pulmonic Valve: The pulmonic valve was grossly normal. Pulmonic valve regurgitation is trivial. Aorta: The aortic root is normal in size and structure. Venous: The inferior vena cava is normal in size with greater than 50% respiratory variability, suggesting right atrial pressure of 3 mmHg. IAS/Shunts: No atrial level shunt detected by color flow Doppler.  LEFT VENTRICLE PLAX 2D LVIDd:         4.30 cm   Diastology LVIDs:         3.20 cm   LV e' medial:    5.66 cm/s LV PW:         1.00 cm   LV E/e' medial:  11.4 LV IVS:        1.20 cm   LV e' lateral:   11.30 cm/s LVOT diam:     2.00 cm   LV E/e' lateral: 5.7 LV SV:         53 LV SV Index:   28 LVOT Area:     3.14 cm  RIGHT VENTRICLE RV S prime:     18.20 cm/s TAPSE (M-mode): 1.7 cm LEFT ATRIUM             Index        RIGHT ATRIUM           Index LA diam:        3.20 cm 1.67 cm/m   RA Area:     14.60 cm LA Vol (A2C):   39.9 ml 20.81 ml/m  RA Volume:   41.50 ml  21.64 ml/m LA  Vol (A4C):   24.5 ml 12.78 ml/m LA Biplane Vol: 32.4 ml 16.90 ml/m  AORTIC VALVE LVOT Vmax:   119.00 cm/s LVOT Vmean:  70.500 cm/s LVOT VTI:    0.168 m  AORTA Ao Root diam: 3.40 cm MITRAL VALVE MV Area (PHT): 1.53 cm    SHUNTS MV Decel Time: 496 msec    Systemic VTI:  0.17 m MV E velocity: 64.80 cm/s  Systemic Diam: 2.00 cm MV A velocity: 83.60 cm/s MV E/A ratio:  0.78 Nona Dell MD Electronically signed by Nona Dell MD Signature Date/Time: 08/29/2023/1:33:58 PM    Final    DG Chest Port 1 View Result Date: 08/29/2023 CLINICAL DATA:  43 year old female with shortness of breath, nonproductive cough for 2-3 days. EXAM: PORTABLE CHEST 1 VIEW COMPARISON:  Chest radiographs 12/26/2014. FINDINGS: Portable AP semi upright view at 1022 hours. Lordotic positioning.  Cardiomegaly is new since 2016. Lower lung volumes. Diffuse bilateral pulmonary interstitial opacity, basilar and peripheral predominant and fairly symmetric. Trace fluid in the right minor fissure. No other pleural effusions are evident. No pneumothorax or air bronchograms. No acute osseous abnormality identified. Negative visible bowel gas pattern. IMPRESSION: Cardiomegaly is new since 2016 with diffuse pulmonary interstitial opacity favored to be acute pulmonary edema. Trace pleural fluid in the right minor fissure. Bilateral pneumonia felt less likely. Electronically Signed   By: Odessa Fleming M.D.   On: 08/29/2023 11:01   (Echo, Carotid, EGD, Colonoscopy, ERCP)    Subjective: Patient seen in the morning rounds.  Denies any complaints.  Eager to go home.  On room air since last 24 hours.   Discharge Exam: Vitals:   09/05/23 0700 09/05/23 0841  BP: 127/79   Pulse: 90 85  Resp: 16 15  Temp: 98.8 F (37.1 C)   SpO2: 100% 100%   Vitals:   09/04/23 2002 09/05/23 0400 09/05/23 0700 09/05/23 0841  BP: 120/82 121/78 127/79   Pulse: 91 87 90 85  Resp: 20 19 16 15   Temp: 98.1 F (36.7 C) 98.4 F (36.9 C) 98.8 F (37.1 C)   TempSrc:  Oral Oral   SpO2: 97% 99% 100% 100%  Weight:      Height:        General: Pt is alert, awake, not in acute distress Cardiovascular: RRR, S1/S2 +, no rubs, no gallops Respiratory: CTA bilaterally, no wheezing, no rhonchi Abdominal: Soft, NT, ND, bowel sounds + Extremities: no edema, no cyanosis    The results of significant diagnostics from this hospitalization (including imaging, microbiology, ancillary and laboratory) are listed below for reference.     Microbiology: Recent Results (from the past 240 hours)  Resp panel by RT-PCR (RSV, Flu A&B, Covid) Anterior Nasal Swab     Status: None   Collection Time: 08/29/23 10:13 AM   Specimen: Anterior Nasal Swab  Result Value Ref Range Status   SARS Coronavirus 2 by RT PCR NEGATIVE NEGATIVE Final     Comment: (NOTE) SARS-CoV-2 target nucleic acids are NOT DETECTED.  The SARS-CoV-2 RNA is generally detectable in upper respiratory specimens during the acute phase of infection. The lowest concentration of SARS-CoV-2 viral copies this assay can detect is 138 copies/mL. A negative result does not preclude SARS-Cov-2 infection and should not be used as the sole basis for treatment or other patient management decisions. A negative result may occur with  improper specimen collection/handling, submission of specimen other than nasopharyngeal swab, presence of viral mutation(s) within the areas targeted by this assay, and inadequate  number of viral copies(<138 copies/mL). A negative result must be combined with clinical observations, patient history, and epidemiological information. The expected result is Negative.  Fact Sheet for Patients:  BloggerCourse.com  Fact Sheet for Healthcare Providers:  SeriousBroker.it  This test is no t yet approved or cleared by the [place] FDA and  has been authorized for detection and/or diagnosis of SARS-CoV-2 by FDA under an Emergency Use Authorization (EUA). This EUA will remain  in effect (meaning this test can be used) for the duration of the COVID-19 declaration under Section 564(b)(1) of the Act, 21 U.S.C.section 360bbb-3(b)(1), unless the authorization is terminated  or revoked sooner.       Influenza A by PCR NEGATIVE NEGATIVE Final   Influenza B by PCR NEGATIVE NEGATIVE Final    Comment: (NOTE) The Xpert Xpress SARS-CoV-2/FLU/RSV plus assay is intended as an aid in the diagnosis of influenza from Nasopharyngeal swab specimens and should not be used as a sole basis for treatment. Nasal washings and aspirates are unacceptable for Xpert Xpress SARS-CoV-2/FLU/RSV testing.  Fact Sheet for Patients: BloggerCourse.com  Fact Sheet for Healthcare  Providers: SeriousBroker.it  This test is not yet approved or cleared by the [place] FDA and has been authorized for detection and/or diagnosis of SARS-CoV-2 by FDA under an Emergency Use Authorization (EUA). This EUA will remain in effect (meaning this test can be used) for the duration of the COVID-19 declaration under Section 564(b)(1) of the Act, 21 U.S.C. section 360bbb-3(b)(1), unless the authorization is terminated or revoked.     Resp Syncytial Virus by PCR NEGATIVE NEGATIVE Final    Comment: (NOTE) Fact Sheet for Patients: BloggerCourse.com  Fact Sheet for Healthcare Providers: SeriousBroker.it  This test is not yet approved or cleared by the [place] FDA and has been authorized for detection and/or diagnosis of SARS-CoV-2 by FDA under an Emergency Use Authorization (EUA). This EUA will remain in effect (meaning this test can be used) for the duration of the COVID-19 declaration under Section 564(b)(1) of the Act, 21 U.S.C. section 360bbb-3(b)(1), unless the authorization is terminated or revoked.  Performed at [facility], [address] [place], [place] [address]   Culture, blood (Routine X 2) w Reflex to ID Panel     Status: None   Collection Time: 08/29/23 10:25 AM   Specimen: Left Antecubital; Blood  Result Value Ref Range Status   Specimen Description LEFT ANTECUBITAL  Final   Special Requests BOTTLES DRAWN AEROBIC AND ANAEROBIC  Final   Culture   Final    NO GROWTH 5 DAYS Performed at [facility], [address], [place], [place] [address]    Report Status 09/03/2023 FINAL  Final  Culture, blood (Routine X 2) w Reflex to ID Panel     Status: None   Collection Time: 08/29/23  6:12 PM   Specimen: Right Antecubital; Blood  Result Value Ref Range Status   Specimen Description RIGHT ANTECUBITAL  Final   Special Requests BOTTLES DRAWN AEROBIC AND ANAEROBIC  Final   Culture    Final    NO GROWTH 5 DAYS Performed at [facility], [address], [place], [place] [address]    Report Status 09/03/2023 FINAL  Final  MRSA Next Gen by PCR, Nasal     Status: Abnormal   Collection Time: 08/30/23  1:01 AM   Specimen: Urine, Catheterized; Nasal Swab  Result Value Ref Range Status   MRSA by PCR Next Gen DETECTED (A) NOT DETECTED Final    Comment:  RESULT CALLED TO, READ BACK BY AND VERIFIED WITH: D WASHINGTON RN 08/30/2023 @ 0409 BY AB (NOTE) The GeneXpert MRSA Assay (FDA approved for NASAL specimens only), is one component of a comprehensive MRSA colonization surveillance program. It is not intended to diagnose MRSA infection nor to guide or monitor treatment for MRSA infections. Test performance is not FDA approved in patients less than 21 years old. Performed at [facility], [address], [place], [place] [address]   Respiratory (~20 pathogens) panel by PCR     Status: Abnormal   Collection Time: 08/30/23  2:09 PM   Specimen: Nasopharyngeal Swab; Respiratory  Result Value Ref Range Status   Adenovirus NOT DETECTED NOT DETECTED Final   Coronavirus 229E NOT DETECTED NOT DETECTED Final    Comment: (NOTE) The Coronavirus on the Respiratory Panel, DOES NOT test for the novel  Coronavirus (2019 nCoV)    Coronavirus HKU1 NOT DETECTED NOT DETECTED Final   Coronavirus NL63 NOT DETECTED NOT DETECTED Final   Coronavirus OC43 NOT DETECTED NOT DETECTED Final   Metapneumovirus NOT DETECTED NOT DETECTED Final   Rhinovirus / Enterovirus DETECTED (A) NOT DETECTED Final   Influenza A NOT DETECTED NOT DETECTED Final   Influenza B NOT DETECTED NOT DETECTED Final   Parainfluenza Virus 1 NOT DETECTED NOT DETECTED Final   Parainfluenza Virus 2 NOT DETECTED NOT DETECTED Final   Parainfluenza Virus 3 NOT DETECTED NOT DETECTED Final   Parainfluenza Virus 4 NOT DETECTED NOT DETECTED Final   Respiratory Syncytial Virus NOT DETECTED NOT DETECTED Final   Bordetella pertussis NOT  DETECTED NOT DETECTED Final   Bordetella Parapertussis NOT DETECTED NOT DETECTED Final   Chlamydophila pneumoniae NOT DETECTED NOT DETECTED Final   Mycoplasma pneumoniae NOT DETECTED NOT DETECTED Final    Comment: Performed at [facility], [address] [place], [place] [address]  Culture, Respiratory w Gram Stain     Status: None   Collection Time: 08/30/23 10:51 PM   Specimen: Tracheal Aspirate; Respiratory  Result Value Ref Range Status   Specimen Description TRACHEAL ASPIRATE  Final   Special Requests NONE  Final   Gram Stain   Final    NO WBC SEEN NO ORGANISMS SEEN Performed at [facility], [address], [place], [place] [address]    Culture RARE CANDIDA ALBICANS  Final   Report Status 09/02/2023 FINAL  Final     Labs: BNP (last 3 results) Recent Labs    09/02/23 0349 09/03/23 0608 09/04/23 0544  BNP 59.3 23.2 25.5   Basic Metabolic Panel: Recent Labs  Lab 08/30/23 0400 08/30/23 0856 08/30/23 0859 08/30/23 1659 08/30/23 1726 08/31/23 0233 08/31/23 1047 08/31/23 1640 09/01/23 0216 09/01/23 0315 09/02/23 0349 09/03/23 0608  NA  --    < >  --  131*   < > 134*   < > 134* 135 134* 142 136  K  --    < >  --  4.5   < > 4.0   < > 4.7 4.6 4.8 4.1 4.2  CL  --    < >  --  100  --  104  --  104 103  --  106 104  CO2  --    < >  --  17*  --  19*  --  20* 19*  --  20* 20*  GLUCOSE  --    < >  --  119*  --  115*  --  125*  133*  --  121* 113*  BUN  --    < >  --  15  --  20  --  16 13  --  15 12  CREATININE  --    < >  --  1.12*  --  0.94  --  0.79 0.63  --  0.77 0.62  CALCIUM  --    < >  --  8.8*  --  8.3*  --  8.7* 9.0  --  9.3 9.1  MG 2.2  --  2.2 2.2  --  2.1  --  2.2  --   --   --   --   PHOS 5.6*  --  4.9* 5.3*  --  4.5  --  3.8  --   --   --   --    < > = values in this interval not displayed.   Liver Function Tests: No results for input(s): "AST", "ALT", "ALKPHOS", "BILITOT", "PROT", "ALBUMIN" in the last 168 hours. Recent Labs  Lab  09/01/23 0216  LIPASE 18   No results for input(s): "AMMONIA" in the last 168 hours. CBC: Recent Labs  Lab 08/30/23 0856 08/30/23 1726 08/31/23 0233 08/31/23 1047 09/01/23 0216 09/01/23 0315 09/02/23 0349 09/03/23 0608  WBC 10.3  --  16.1*  --  10.7*  --  8.1 8.2  HGB 11.4*   < > 10.3* 10.2* 10.2* 9.9* 11.2* 11.1*  HCT 34.3*   < > 31.1* 30.0* 30.6* 29.0* 33.9* 33.7*  MCV 90.5  --  91.5  --  92.2  --  92.1 92.6  PLT 384  --  396  --  413*  --  448* 465*   < > = values in this interval not displayed.   Cardiac Enzymes: No results for input(s): "CKTOTAL", "CKMB", "CKMBINDEX", "TROPONINI" in the last 168 hours. BNP: Invalid input(s): "POCBNP" CBG: Recent Labs  Lab 09/03/23 1635 09/03/23 2042 09/04/23 0041 09/04/23 0425 09/04/23 0907  GLUCAP 85 110* 92 116* 89   D-Dimer No results for input(s): "DDIMER" in the last 72 hours. Hgb A1c No results for input(s): "HGBA1C" in the last 72 hours. Lipid Profile No results for input(s): "CHOL", "HDL", "LDLCALC", "TRIG", "CHOLHDL", "LDLDIRECT" in the last 72 hours. Thyroid function studies No results for input(s): "TSH", "T4TOTAL", "T3FREE", "THYROIDAB" in the last 72 hours.  Invalid input(s): "FREET3" Anemia work up No results for input(s): "VITAMINB12", "FOLATE", "FERRITIN", "TIBC", "IRON", "RETICCTPCT" in the last 72 hours. Urinalysis    Component Value Date/Time   COLORURINE YELLOW 08/30/2023 0227   APPEARANCEUR CLEAR 08/30/2023 0227   LABSPEC 1.017 08/30/2023 0227   PHURINE 5.0 08/30/2023 0227   GLUCOSEU NEGATIVE 08/30/2023 0227   HGBUR SMALL (A) 08/30/2023 0227   BILIRUBINUR NEGATIVE 08/30/2023 0227   KETONESUR NEGATIVE 08/30/2023 0227   PROTEINUR NEGATIVE 08/30/2023 0227   UROBILINOGEN 0.2 06/09/2013 1825   NITRITE NEGATIVE 08/30/2023 0227   LEUKOCYTESUR NEGATIVE 08/30/2023 0227   Sepsis Labs Recent Labs  Lab 08/31/23 0233 09/01/23 0216 09/02/23 0349 09/03/23 0608  WBC 16.1* 10.7* 8.1 8.2    Microbiology Recent Results (from the past 240 hours)  Resp panel by RT-PCR (RSV, Flu A&B, Covid) Anterior Nasal Swab     Status: None   Collection Time: 08/29/23 10:13 AM   Specimen: Anterior Nasal Swab  Result Value Ref Range Status   SARS Coronavirus 2 by RT PCR NEGATIVE NEGATIVE Final    Comment: (NOTE) SARS-CoV-2 target nucleic acids are NOT DETECTED.  The  SARS-CoV-2 RNA is generally detectable in upper respiratory specimens during the acute phase of infection. The lowest concentration of SARS-CoV-2 viral copies this assay can detect is 138 copies/mL. A negative result does not preclude SARS-Cov-2 infection and should not be used as the sole basis for treatment or other patient management decisions. A negative result may occur with  improper specimen collection/handling, submission of specimen other than nasopharyngeal swab, presence of viral mutation(s) within the areas targeted by this assay, and inadequate number of viral copies(<138 copies/mL). A negative result must be combined with clinical observations, patient history, and epidemiological information. The expected result is Negative.  Fact Sheet for Patients:  BloggerCourse.com  Fact Sheet for Healthcare Providers:  SeriousBroker.it  This test is no t yet approved or cleared by the [place] FDA and  has been authorized for detection and/or diagnosis of SARS-CoV-2 by FDA under an Emergency Use Authorization (EUA). This EUA will remain  in effect (meaning this test can be used) for the duration of the COVID-19 declaration under Section 564(b)(1) of the Act, 21 U.S.C.section 360bbb-3(b)(1), unless the authorization is terminated  or revoked sooner.       Influenza A by PCR NEGATIVE NEGATIVE Final   Influenza B by PCR NEGATIVE NEGATIVE Final    Comment: (NOTE) The Xpert Xpress SARS-CoV-2/FLU/RSV plus assay is intended as an aid in the diagnosis of influenza  from Nasopharyngeal swab specimens and should not be used as a sole basis for treatment. Nasal washings and aspirates are unacceptable for Xpert Xpress SARS-CoV-2/FLU/RSV testing.  Fact Sheet for Patients: BloggerCourse.com  Fact Sheet for Healthcare Providers: SeriousBroker.it  This test is not yet approved or cleared by the [place] FDA and has been authorized for detection and/or diagnosis of SARS-CoV-2 by FDA under an Emergency Use Authorization (EUA). This EUA will remain in effect (meaning this test can be used) for the duration of the COVID-19 declaration under Section 564(b)(1) of the Act, 21 U.S.C. section 360bbb-3(b)(1), unless the authorization is terminated or revoked.     Resp Syncytial Virus by PCR NEGATIVE NEGATIVE Final    Comment: (NOTE) Fact Sheet for Patients: BloggerCourse.com  Fact Sheet for Healthcare Providers: SeriousBroker.it  This test is not yet approved or cleared by the [place] FDA and has been authorized for detection and/or diagnosis of SARS-CoV-2 by FDA under an Emergency Use Authorization (EUA). This EUA will remain in effect (meaning this test can be used) for the duration of the COVID-19 declaration under Section 564(b)(1) of the Act, 21 U.S.C. section 360bbb-3(b)(1), unless the authorization is terminated or revoked.  Performed at [facility], [address], [place], [place] [address]   Culture, blood (Routine X 2) w Reflex to ID Panel     Status: None   Collection Time: 08/29/23 10:25 AM   Specimen: Left Antecubital; Blood  Result Value Ref Range Status   Specimen Description LEFT ANTECUBITAL  Final   Special Requests BOTTLES DRAWN AEROBIC AND ANAEROBIC  Final   Culture   Final    NO GROWTH 5 DAYS Performed at [facility], [address] [place], [place] [address]    Report Status 09/03/2023 FINAL  Final  Culture, blood  (Routine X 2) w Reflex to ID Panel     Status: None   Collection Time: 08/29/23  6:12 PM   Specimen: Right Antecubital; Blood  Result Value Ref Range Status   Specimen Description RIGHT ANTECUBITAL  Final   Special Requests BOTTLES DRAWN AEROBIC AND ANAEROBIC  Final  Culture   Final    NO GROWTH 5 DAYS Performed at [facility], [address] [place], [place] [address]    Report Status 09/03/2023 FINAL  Final  MRSA Next Gen by PCR, Nasal     Status: Abnormal   Collection Time: 08/30/23  1:01 AM   Specimen: Urine, Catheterized; Nasal Swab  Result Value Ref Range Status   MRSA by PCR Next Gen DETECTED (A) NOT DETECTED Final    Comment: RESULT CALLED TO, READ BACK BY AND VERIFIED WITH: D WASHINGTON RN 08/30/2023 @ 0409 BY AB (NOTE) The GeneXpert MRSA Assay (FDA approved for NASAL specimens only), is one component of a comprehensive MRSA colonization surveillance program. It is not intended to diagnose MRSA infection nor to guide or monitor treatment for MRSA infections. Test performance is not FDA approved in patients less than 21 years old. Performed at [facility], [address], [place], [place] [address]   Respiratory (~20 pathogens) panel by PCR     Status: Abnormal   Collection Time: 08/30/23  2:09 PM   Specimen: Nasopharyngeal Swab; Respiratory  Result Value Ref Range Status   Adenovirus NOT DETECTED NOT DETECTED Final   Coronavirus 229E NOT DETECTED NOT DETECTED Final    Comment: (NOTE) The Coronavirus on the Respiratory Panel, DOES NOT test for the novel  Coronavirus (2019 nCoV)    Coronavirus HKU1 NOT DETECTED NOT DETECTED Final   Coronavirus NL63 NOT DETECTED NOT DETECTED Final   Coronavirus OC43 NOT DETECTED NOT DETECTED Final   Metapneumovirus NOT DETECTED NOT DETECTED Final   Rhinovirus / Enterovirus DETECTED (A) NOT DETECTED Final   Influenza A NOT DETECTED NOT DETECTED Final   Influenza B NOT DETECTED NOT DETECTED Final   Parainfluenza Virus 1 NOT  DETECTED NOT DETECTED Final   Parainfluenza Virus 2 NOT DETECTED NOT DETECTED Final   Parainfluenza Virus 3 NOT DETECTED NOT DETECTED Final   Parainfluenza Virus 4 NOT DETECTED NOT DETECTED Final   Respiratory Syncytial Virus NOT DETECTED NOT DETECTED Final   Bordetella pertussis NOT DETECTED NOT DETECTED Final   Bordetella Parapertussis NOT DETECTED NOT DETECTED Final   Chlamydophila pneumoniae NOT DETECTED NOT DETECTED Final   Mycoplasma pneumoniae NOT DETECTED NOT DETECTED Final    Comment: Performed at [facility], [address], [place], [place] [address]  Culture, Respiratory w Gram Stain     Status: None   Collection Time: 08/30/23 10:51 PM   Specimen: Tracheal Aspirate; Respiratory  Result Value Ref Range Status   Specimen Description TRACHEAL ASPIRATE  Final   Special Requests NONE  Final   Gram Stain   Final    NO WBC SEEN NO ORGANISMS SEEN Performed at [facility], [address] [place], [place] [address]    Culture RARE CANDIDA ALBICANS  Final   Report Status 09/02/2023 FINAL  Final     Time coordinating discharge: 35 minutes  SIGNED:   Dorcas Carrow, MD  [facility] 09/05/2023, 10:16 AM

## 2023-09-08 ENCOUNTER — Telehealth: Payer: Self-pay

## 2023-09-08 ENCOUNTER — Telehealth: Payer: Self-pay | Admitting: Family Medicine

## 2023-09-08 NOTE — Telephone Encounter (Signed)
FYI pt released from hospital Friday 2/14- states she had fluid around heart & lungs along with pneumonia and norovirus.

## 2023-09-08 NOTE — Transitions of Care (Post Inpatient/ED Visit) (Signed)
   09/08/2023  Name: [name] MRN: [id number] DOB: 1980/09/04  Today's TOC FU Call Status: Today's TOC FU Call Status:: Unsuccessful Call (1st Attempt) Unsuccessful Call (1st Attempt) Date: 09/08/23  Attempted to reach the patient regarding the most recent Inpatient/ED visit.  Follow Up Plan: Additional outreach attempts will be made to reach the patient to complete the Transitions of Care (Post Inpatient/ED visit) call.   Signature  Agnes Lawrence, CMA (AAMA)  [facility] [phone]

## 2023-09-10 NOTE — Telephone Encounter (Signed)
 Copied from CRM [id number]. Topic: Clinical - Home Health Verbal Orders >> Sep 10, 2023  1:43 PM Desma Mcgregor wrote: Caller/Agency: Homero Fellers, [facility] Callback Number: [phone] Has secured voicemail Service Requested: Skilled Nursing evaluation Frequency: N/A Any new concerns about the patient? No. Unable to answer as they have not seen patient yet.

## 2023-09-12 ENCOUNTER — Telehealth: Payer: Self-pay | Admitting: Neurology

## 2023-09-12 NOTE — Telephone Encounter (Signed)
Pt cld again with ear ache cause extreme headaxhe, moved up appt  09/15/23.

## 2023-09-12 NOTE — Telephone Encounter (Signed)
Pt called informed She needs to see her PCP or Urgent care for earache causing severe headache,

## 2023-09-12 NOTE — Telephone Encounter (Signed)
She needs to see her PCP or Urgent care for earache causing severe headache, thanks

## 2023-09-15 ENCOUNTER — Ambulatory Visit (INDEPENDENT_AMBULATORY_CARE_PROVIDER_SITE_OTHER): Payer: 59 | Admitting: Neurology

## 2023-09-15 ENCOUNTER — Encounter: Payer: Self-pay | Admitting: Neurology

## 2023-09-15 VITALS — BP 118/78 | HR 78 | Ht 61.0 in | Wt 202.4 lb

## 2023-09-15 DIAGNOSIS — G43009 Migraine without aura, not intractable, without status migrainosus: Secondary | ICD-10-CM

## 2023-09-15 DIAGNOSIS — G40019 Localization-related (focal) (partial) idiopathic epilepsy and epileptic syndromes with seizures of localized onset, intractable, without status epilepticus: Secondary | ICD-10-CM | POA: Diagnosis not present

## 2023-09-15 MED ORDER — AIMOVIG 140 MG/ML ~~LOC~~ SOAJ: 1.0000 mL | SUBCUTANEOUS | 11 refills | Status: DC

## 2023-09-15 MED ORDER — UBRELVY 100 MG PO TABS: ORAL_TABLET | ORAL | 5 refills | Status: DC

## 2023-09-15 MED ORDER — NORTRIPTYLINE HCL 25 MG PO CAPS: ORAL_CAPSULE | ORAL | 3 refills | Status: DC

## 2023-09-15 MED ORDER — ZONISAMIDE 100 MG PO CAPS: ORAL_CAPSULE | ORAL | 3 refills | Status: DC

## 2023-09-15 MED ORDER — BRIVARACETAM 100 MG PO TABS: 100.0000 mg | ORAL_TABLET | Freq: Two times a day (BID) | ORAL | 5 refills | Status: DC

## 2023-09-15 MED ORDER — OXCARBAZEPINE 600 MG PO TABS: ORAL_TABLET | ORAL | 3 refills | Status: DC

## 2023-09-15 NOTE — Progress Notes (Signed)
 NEUROLOGY FOLLOW UP OFFICE NOTE  [name] [id number] [birth year]  HISTORY OF PRESENT ILLNESS: I had the pleasure of seeing [name] in follow-up in the neurology clinic on 09/15/2023.  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 She is alone in the office today. Records and images were personally reviewed where available.  Since her last visit, she reports a seizure in 02/2023. She was admitted earlier this month for pneumonia requiring intubation. Review of sodium levels show Na 123 in 04/2023, 123 on admission 08/2023, then 136 on 09/12/23. Oxcarbazepine dose had been lowered to 600mg  BID, she continues on Briviact 100mg  BID and Zonisamide 100mg  in AM, 500mg  in PM. She denies any seizures since 02/2023.   She continues to have frequent migraines and states Emgality is not helping. She reports more than 15 migraine days a month, last migraine was the other day. Ubrelvy did not help. She is also on nortriptyline 50mg  at bedtime without side effects. She called our office about right ear pain, she has a cough and a little bit of congestion. No fever. She sees her PCP in 2 days. She feels she gets enough sleep. Mood is "alright." She lives alone with an aide coming daily to clean and cook for her. She does not drive.    History from Initial Assessment 11/14/2017: This is a 43 year old right-handed woman with a history of migraines, back pain, and seizures, presenting to establish care. Seizures started around 2010 when she started having nocturnal convulsions with tongue bite and urinary incontinence. She would wake up with her children telling her what happened. She would be slightly confused but back to baseline quickly. She was on Keppra and Lamictal with no change in seizure frequency. At that time she was also drinking heavily, but even with decrease in alcohol intake, had not noticed any change in frequency.  She was admitted to [facility] for 7 days in October 2012, typical episodes were not captured. EMU report unavailable for review, on discharge summary it was noted there were incidental left-sided sharp waves of which there may or ma not be a correlation to her spells. Keppra stopped and Lamictal dose increased. She was seeing neurologist Dr. Gerilyn Pilgrim, records unavailable for review, but it appears Fycompa was added to Lamictal. She reported worsening of seizures, now occurring in wakefulness, and was admitted for another 7-day EMU stay at [facility] in January 2018, again typical events were not captured. Baseline EEG was abnormal with intermittent left anterior to midtemporal slow waves, sharply contoured bursts of theta slowing over the left temporal region, as well as intermittent right temporal theta and delta slowing. There were also frequent 3-4 Hz very brief intermediate duration fluctuating runs of rhythmic delta activities (LRDA) maximal at F7. Occasional very brief runs were also seen maximal at F8.There were occasional independent left temporal and right temporal sharp waves. At times left temporal discharges have a wide field. There are ER visits in April, May, October, November 2018 for seizures, medications listed included Vimpat, Fycompa, and Oxtellar. Her last ER visit was on 11/05/17, taking Oxtellar 1200mg  BID without side effects. She reports compliance to medication.   She brings a calendar of her seizures in 2019. She had 2 in January, 2 in February, 1 in March, 3 so far in April. The last seizure occurred on 11/11/17 out of sleep, she woke up with a lip bite. She has had a  lot of her seizures occur while playing at the [facility]. Family has witnessed the seizures, describing staring/unresponsiveness with lip smacking, then convulsions with head turn to the right. She denies any prior warning symptoms, no post-ictal focal weakness. Her entire body feels sore. No clear seizure triggers, she  denies any sleep deprivation or missed medications. She has significantly cut down on alcohol, drinking 1 beer on the weekend.    She denies any olfactory/gustatory hallucinations, deja vu, rising epigastric sensation, focal numbness/tingling/weakness, myoclonic jerks. She has migraines after a seizure and had a prescription for Norco. She also has Zipsor and Robaxin to take after seizures. She denies any dizziness, diplopia, dysarthria/dysphagia, neck/back pain, bowel/bladder dysfunction.    Epilepsy Risk Factors:  Her brother had seizures in childhood. She had a normal birth and early development.  There is no history of febrile convulsions, CNS infections such as meningitis/encephalitis, significant traumatic brain injury, neurosurgical procedures, or family history of seizures. She has a scar over the left temporal region after hitting her head on a window, she denies any loss of consciousness with this.    Prior AEDs: Lamictal, Keppra, Fycompa, Vimpat, Depakote, Cenobamate Prior migraine rescue medications: sumatriptan, rizatriptan, naratriptan, Migranal nasal spray, Ubrelvy, Fioricet, Robaxin, flexeril, ibuprofen, naproxen, diclofenac.  Prior Migraine Preventative: emgality  Laboratory Data:  EEGs: Her EEG at [facility] had shown bilateral temporal slowing, as well as rhythmic delta at F7 and F8, independent left and right temporal sharp waves. MRI: MRI brain with and without contrast in 10/2008 was unremarkable with scatted white matter changes I personally reviewed MRI brain with and without contrast done 11/17/17 which did not show any acute changes, hippocampi symmetric with no abnormal signal or enhancement seen. There were several white matter changes bilaterally with no abnormal enhancement.  Records from [facility] reviewed: She had a PET scan in 01/2020: no seizure focus identified  Video EEG (02/17/2020-02/23/2020) : She had one GTC with R-head deviation (appeared to be L-sided onset) and another  episode with grunting and unclear shaking (pt off camera) with postictal agiatation, but no staring spells with oral and hand automatisms. Interictal EEG showed an abnormal EEG due to occasional left temporoparietal and parietal sharp waves.  Neuropsychological evaluation in 12/2019: Abnormal but consistent with expectation given her overall intellectual functioning and limited educational attainment. She does have slightly better discrimination or recognition of visual versus verbal material. However, overall, there is not compelling evidence of lateralized dysfunction. Given her memory dysfunction, results suggest that she would be at a low risk of further memory impairment should she undergo temporal resection. There are no mood or psychiatric concerns of note and she has strong family support.    Oxcarbazepine level 03/2020: 36 (ref 10-35) Zonisamide level 03/2020: 24.2 (ref 10-40)   PAST MEDICAL HISTORY: Past Medical History:  Diagnosis Date   Back pain    Chronic headaches    Depression    Fall 03/07/2021   Genital warts    herpes   Herpes    Lip injury 01/25/2021   Lower lip trauma on 01/19/2021 during seizure, treated at [facility] urgent care   Seizures [facility])    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 ([facility])   Vision loss, bilateral 05/08/2021    MEDICATIONS: Current Outpatient Medications on File Prior to Visit  Medication Sig Dispense Refill   aspirin 81 MG chewable tablet Chew 1 tablet (81 mg total) by mouth daily. 90 tablet 0   atorvastatin (LIPITOR) 40  MG tablet Take 1 tablet (40 mg total) by mouth daily. 90 tablet 0   BRIVIACT 100 MG TABS tablet TAKE 1 TABLET BY MOUTH TWICE DAILY 60 tablet 5   busPIRone (BUSPAR) 5 MG tablet TAKE 1 TABLET(5 MG) BY MOUTH TWICE DAILY 60 tablet 2   famciclovir (FAMVIR) 250 MG tablet TAKE ONE (1) TABLET BY MOUTH TWICE DAILY 60 tablet 10   Galcanezumab-gnlm (EMGALITY) 120 MG/ML SOAJ Inject 1  pen  into the skin every 30 (thirty) days. 1.12 mL 11   ketoconazole (NIZORAL) 2 % cream Apply 1 Application topically 2 (two) times daily as needed for irritation. 60 g 1   medroxyPROGESTERone (DEPO-PROVERA) 150 MG/ML injection ADMINISTER 1 ML(150 MG) IN THE MUSCLE EVERY 3 MONTHS 1 mL 4   meloxicam (MOBIC) 15 MG tablet Take 1 tablet (15 mg total) by mouth daily. 14 tablet 0   metoprolol tartrate (LOPRESSOR) 25 MG tablet Take 1 tablet (25 mg total) by mouth 2 (two) times daily. 60 tablet 0   nortriptyline (PAMELOR) 25 MG capsule TAKE 2 CAPSULES BY MOUTH EVERY NIGHT 180 capsule 0   oxcarbazepine (TRILEPTAL) 600 MG tablet Take 1 tablet two times daily .     terbinafine (LAMISIL) 250 MG tablet Take 1 tablet (250 mg total) by mouth daily. 90 tablet 0   tiZANidine (ZANAFLEX) 4 MG tablet TAKE 1 TABLET BY MOUTH EVERY 6 HOURS AS NEEDED FOR MUSCLE SPASMS 30 tablet 10   triamcinolone (KENALOG) 0.025 % ointment Apply 1 Application topically 2 (two) times daily as needed (itching). 30 g 0   Ubrogepant (UBRELVY) 100 MG TABS Take 1 tablet as needed at onset of migraine. Do not take more than 3 a week 10 tablet 5   Vitamin D, Ergocalciferol, (DRISDOL) 1.25 MG (50000 UNIT) CAPS capsule Take 1 capsule (50,000 Units total) by mouth every 7 (seven) days. 12 capsule 2   zonisamide (ZONEGRAN) 100 MG capsule TAKE ONE (1) CAPSULE BY MOUTH ONCE DAILY IN THE MORNING AND TAKE FIVE (5) CAPSULES IN THE EVENING 540 capsule 3   No current facility-administered medications on file prior to visit.    ALLERGIES: Allergies  Allergen Reactions   Morphine Other (See Comments)    Unknown Reaction      FAMILY HISTORY: Family History  Problem Relation Age of Onset   Hypertension Mother    Healthy Father     SOCIAL HISTORY: Social History   Socioeconomic History   Marital status: Single    Spouse name: Not on file   Number of children: 2   Years of education: 23, GED   Highest education level: 10th grade   Occupational History    Comment: na  Tobacco Use   Smoking status: Every Day    Current packs/day: 1.00    Average packs/day: 1 pack/day for 10.0 years (10.0 ttl pk-yrs)    Types: Cigarettes    Passive exposure: Current   Smokeless tobacco: Never   Tobacco comments:    Offered Smoking Cessation.  Vaping Use   Vaping status: Never Used  Substance and Sexual Activity   Alcohol use: Not Currently    Alcohol/week: 2.0 standard drinks of alcohol    Types: 2 Cans of beer per week    Comment: last drink in December   Drug use: Yes    Types: Marijuana    Comment: everyday   Sexual activity: Not Currently    Partners: Male    Birth control/protection: None, Injection  Other Topics Concern  Not on file  Social History Narrative   Lives  With 1 child in home      Caffeine -Pepsi 2 daily       Right handed      Highest level of edu- 11 grade      Disabled   Social Drivers of Health   Financial Resource Strain: Low Risk  (07/30/2023)   Overall Financial Resource Strain (CARDIA)    Difficulty of Paying Living Expenses: Not hard at all  Food Insecurity: No Food Insecurity (08/30/2023)   Hunger Vital Sign    Worried About Running Out of Food in the Last Year: Never true    Ran Out of Food in the Last Year: Never true  Transportation Needs: No Transportation Needs (08/30/2023)   PRAPARE - Administrator, Civil Service (Medical): No    Lack of Transportation (Non-Medical): No  Physical Activity: Inactive (07/30/2023)   Exercise Vital Sign    Days of Exercise per Week: 0 days    Minutes of Exercise per Session: 0 min  Stress: No Stress Concern Present (07/30/2023)   [employer] of Occupational Health - Occupational Stress Questionnaire    Feeling of Stress : Not at all  Social Connections: Socially Isolated (09/04/2023)   Social Connection and Isolation Panel [NHANES]    Frequency of Communication with Friends and Family: More than three times a week    Frequency of  Social Gatherings with Friends and Family: Never    Attends Religious Services: Never    Database administrator or Organizations: No    Attends Banker Meetings: Never    Marital Status: Divorced  Catering manager Violence: Not At Risk (09/04/2023)   Humiliation, Afraid, Rape, and Kick questionnaire    Fear of Current or Ex-Partner: No    Emotionally Abused: No    Physically Abused: No    Sexually Abused: No     PHYSICAL EXAM: Vitals:   09/15/23 1421  BP: 118/78  Pulse: 78  SpO2: 100%   General: No acute distress, flat affect Head:  Normocephalic, healed lip laceration Skin/Extremities: No rash, no edema Neurological Exam: alert and awake. No aphasia or dysarthria. Fund of knowledge is appropriate.  Attention and concentration are normal.  Cranial nerves: Pupils equal, round. Extraocular movements intact with no nystagmus. Visual fields full.  No facial asymmetry.  Motor: Bulk and tone normal, muscle strength 5/5 throughout with no pronator drift.   Finger to nose testing intact.  Gait narrow-based and steady, no ataxia.   IMPRESSION: This is a 43 yo RH woman with a history of migraines and intractable epilepsy s/p left temporal encephalocele resection 07/30/2022. She has had 2 seizures since surgery, last seizure 02/2023. She has had hyponatremia with oxcarbazepine, improved now on 600mg  BID. Continue Briviact 100mg  BID and Zonisamide 100mg  in AM, 500mg  in PM. She also follows up at the [facility] Ep[facility]psy clinic and will schedule follow-up with Dr. Imogene Burn. She continues to have migraines on Emgality, we will switch to Aimovig. She is also on Nortriptyline 50mg  at bedtime with prn Ubrelvy for rescue. Follow-up with PCP for right ear pain, as well as Cardiology after recent hospitalization. She does not drive. Follow-up in 4 months, call for any changes.   Thank you for allowing me to participate in her care.  Please do not hesitate to call for any questions or concerns.   Patrcia Dolly, M.D.   CC: Dr. Lodema Hong

## 2023-09-15 NOTE — Patient Instructions (Addendum)
 Good to see you.  We will switch to a different monthly injection for the migraines, called Aimovig. First dose will be mid-March. Continue all your other medications  2. Call Dr. Nicky Pugh office for follow-up at [facility]  3. Follow-up with PCP for right ear pain and with Cardiology as scheduled  4. Follow-up in 4 months, call for any changes   Seizure Precautions: 1. If medication has been prescribed for you to prevent seizures, take it exactly as directed.  Do not stop taking the medicine without talking to your doctor first, even if you have not had a seizure in a long time.   2. Avoid activities in which a seizure would cause danger to yourself or to others.  Don't operate dangerous machinery, swim alone, or climb in high or dangerous places, such as on ladders, roofs, or girders.  Do not drive unless your doctor says you may.  3. If you have any warning that you may have a seizure, lay down in a safe place where you can't hurt yourself.    4.  No driving for 6 months from last seizure, as per [facility].   Please refer to the following link on the Epilepsy Foundation of America's website for more information: http://www.epilepsyfoundation.org/answerplace/Social/driving/drivingu.cfm   5.  Maintain good sleep hygiene. Avoid alcohol.  6.  Notify your neurology if you are planning pregnancy or if you become pregnant.  7.  Contact your doctor if you have any problems that may be related to the medicine you are taking.  8.  Call 911 and bring the patient back to the ED if:        A.  The seizure lasts longer than 5 minutes.       B.  The patient doesn't awaken shortly after the seizure  C.  The patient has new problems such as difficulty seeing, speaking or moving  D.  The patient was injured during the seizure  E.  The patient has a temperature over 102 F (39C)  F.  The patient vomited and now is having trouble breathing

## 2023-09-17 ENCOUNTER — Ambulatory Visit (INDEPENDENT_AMBULATORY_CARE_PROVIDER_SITE_OTHER): Payer: 59 | Admitting: Family Medicine

## 2023-09-17 ENCOUNTER — Encounter: Payer: Self-pay | Admitting: Family Medicine

## 2023-09-17 VITALS — BP 106/73 | HR 69 | Resp 16 | Ht 61.0 in | Wt 200.0 lb

## 2023-09-17 DIAGNOSIS — Z09 Encounter for follow-up examination after completed treatment for conditions other than malignant neoplasm: Secondary | ICD-10-CM

## 2023-09-17 DIAGNOSIS — J189 Pneumonia, unspecified organism: Secondary | ICD-10-CM | POA: Insufficient documentation

## 2023-09-17 DIAGNOSIS — M26621 Arthralgia of right temporomandibular joint: Secondary | ICD-10-CM | POA: Diagnosis not present

## 2023-09-17 NOTE — Assessment & Plan Note (Signed)
 Rept cXR in 4 to 6 weeks post d/c order entered

## 2023-09-17 NOTE — Patient Instructions (Addendum)
 Annual exam 8/14 or after, call if you need me sooner  Labs today  CXR on March 18 , go to the radiology dept  Pain on right jaw is due to arthritis in the joint, (TMJ) use cool compresses and meloxicam one daily if needed for pain over next 2 weeks  Need to QUIT smoking, 1800QUITNOW provides 24/7 assistance  Thanks for choosing [facility], we consider it a privelige to serve you.

## 2023-09-18 ENCOUNTER — Encounter: Payer: Self-pay | Admitting: Medical

## 2023-09-18 ENCOUNTER — Ambulatory Visit: Payer: Self-pay | Admitting: Family Medicine

## 2023-09-18 ENCOUNTER — Encounter: Payer: Self-pay | Admitting: Family Medicine

## 2023-09-18 ENCOUNTER — Ambulatory Visit: Payer: 59 | Attending: Medical | Admitting: Medical

## 2023-09-18 VITALS — BP 108/70 | HR 72 | Ht 62.0 in | Wt 201.8 lb

## 2023-09-18 DIAGNOSIS — Z09 Encounter for follow-up examination after completed treatment for conditions other than malignant neoplasm: Secondary | ICD-10-CM | POA: Insufficient documentation

## 2023-09-18 DIAGNOSIS — R7989 Other specified abnormal findings of blood chemistry: Secondary | ICD-10-CM

## 2023-09-18 DIAGNOSIS — Z136 Encounter for screening for cardiovascular disorders: Secondary | ICD-10-CM

## 2023-09-18 DIAGNOSIS — M26621 Arthralgia of right temporomandibular joint: Secondary | ICD-10-CM | POA: Insufficient documentation

## 2023-09-18 DIAGNOSIS — R0609 Other forms of dyspnea: Secondary | ICD-10-CM | POA: Diagnosis not present

## 2023-09-18 LAB — CBC WITH DIFFERENTIAL/PLATELET
Basophils Absolute: 0 10*3/uL (ref 0.0–0.2)
Basos: 1 %
EOS (ABSOLUTE): 0.1 10*3/uL (ref 0.0–0.4)
Eos: 3 %
Hematocrit: 37.1 % (ref 34.0–46.6)
Hemoglobin: 11.8 g/dL (ref 11.1–15.9)
Immature Grans (Abs): 0 10*3/uL (ref 0.0–0.1)
Immature Granulocytes: 1 %
Lymphocytes Absolute: 1.6 10*3/uL (ref 0.7–3.1)
Lymphs: 40 %
MCH: 29.9 pg (ref 26.6–33.0)
MCHC: 31.8 g/dL (ref 31.5–35.7)
MCV: 94 fL (ref 79–97)
Monocytes Absolute: 0.5 10*3/uL (ref 0.1–0.9)
Monocytes: 11 %
Neutrophils Absolute: 1.9 10*3/uL (ref 1.4–7.0)
Neutrophils: 44 %
Platelets: 487 10*3/uL — ABNORMAL HIGH (ref 150–450)
RBC: 3.94 x10E6/uL (ref 3.77–5.28)
RDW: 14.9 % (ref 11.7–15.4)
WBC: 4.1 10*3/uL (ref 3.4–10.8)

## 2023-09-18 LAB — BMP8+EGFR
BUN/Creatinine Ratio: 3 — ABNORMAL LOW (ref 9–23)
BUN: 2 mg/dL — ABNORMAL LOW (ref 6–24)
CO2: 18 mmol/L — ABNORMAL LOW (ref 20–29)
Calcium: 9.6 mg/dL (ref 8.7–10.2)
Chloride: 105 mmol/L (ref 96–106)
Creatinine, Ser: 0.77 mg/dL (ref 0.57–1.00)
Glucose: 91 mg/dL (ref 70–99)
Potassium: 5 mmol/L (ref 3.5–5.2)
Sodium: 136 mmol/L (ref 134–144)
eGFR: 97 mL/min/{1.73_m2} (ref >59)

## 2023-09-18 MED ORDER — METOPROLOL TARTRATE 100 MG PO TABS: 100.0000 mg | ORAL_TABLET | ORAL | 0 refills | Status: DC

## 2023-09-18 NOTE — Assessment & Plan Note (Signed)
 Cool compress and meloxicam daily for 1 week, reduce chewing, no gum

## 2023-09-18 NOTE — Progress Notes (Signed)
   NOHEALANI MEDINGER     MRN: [id number]      DOB: 21-Jun-1981  Chief Complaint  Patient presents with   Hospitalization Follow-up    D/c 2/14- pneumonia. Right ear pain when she yawns or chews. Has been going on for a few weeks    HPI Ms. Tanney is here for follow up of hospitalization from 2/7 to 2/14 with acute respiratory failure secondary to rhinovirus pneumonia , superadded CAP, required intubation, reports became acutely ill, feels much better, no fever productive cough or SOB currently Appetite good C/O right ear pain with pressure , chewing and opening her mouth No seizure activity sinc e d/c Has cardiology f/u in am, had elevated troponin wit acute presentation ROS Denies sinus pressure, nasal congestion, ear pain or sore throat. g. Denies chest pains, palpitations and leg swelling Denies abdominal pain, nausea, vomiting,diarrhea or constipation.   Denies dysuria, frequency, hesitancy or incontinence. Denies joint pain, swelling and limitation in mobility. Denies headaches, seizures, numbness, or tingling. Denies depression, anxiety or insomnia. Denies skin break down or rash.   PE  BP 106/73   Pulse 69   Resp 16   Ht 5\' 1"  (1.549 m)   Wt 200 lb (90.7 kg)   SpO2 96%   BMI 37.79 kg/m   Patient alert and oriented and in no cardiopulmonary distress.  HEENT: No facial asymmetry, EOMI,     Neck supple .Right TM tender to direct palpation and wirh opening mouth, TM clear bilaterally  Chest: Clear to auscultation bilaterally.  CVS: S1, S2 no murmurs, no S3.Regular rate.  ABD: Soft non tender.   Ext: No edema  MS: Adequate ROM spine, shoulders, hips and knees.  Skin: Intact, no ulcerations or rash noted.  Psych: Good eye contact, normal affect. Memory intact not anxious or depressed appearing.  CNS: CN 2-12 intact, power,  normal throughout.no focal deficits noted.   Assessment & Plan  Pneumonia Rept cXR in 4 to 6 weeks post d/c order entered  Hospital  discharge follow-up Patient in for follow up of recent hospitalization. Discharge summary, and laboratory and radiology data are reviewed, and any questions or concerns  are discussed. Specific issues requiring follow up are specifically addressed.   TMJ tenderness, right Cool compress and meloxicam daily for 1 week, reduce chewing, no gum

## 2023-09-18 NOTE — Patient Instructions (Addendum)
 Medication Instructions:  Your physician recommends that you continue on your current medications as directed. Please refer to the Current Medication list given to you today.  Take 100 mg Lopressor 2 Hours prior to CT Scan ( Do NOT Take Metoprolol 25 mg on this day )   *If you need a refill on your cardiac medications before your next appointment, please call your pharmacy*   Lab Work: NONE   If you have labs (blood work) drawn today and your tests are completely normal, you will receive your results only by: MyChart Message (if you have MyChart) OR A paper copy in the mail If you have any lab test that is abnormal or we need to change your treatment, we will call you to review the results.   Testing/Procedures:   Your cardiac CT will be scheduled at one of the below locations:   [facility] [address] [place], [place] [address] [phone]  OR  [facility] [address] [place], [place] [address] [phone]  OR   [facility] [address] [place], [place] [address] [phone]  OR   [facility] [address] [place], [place] [address] [phone]  If scheduled at [facility], please arrive at the [facility] and [facility] Entrance (Entrance C2) of [facility] 30 minutes prior to test start time. You can use the FREE valet parking offered at entrance C (encouraged to control the heart rate for the test)  Proceed to the [facility] Radiology Department (first floor) to check-in and test prep.  All radiology patients and guests should use entrance C2 at [facility], accessed from [facility], even though the hospital's physical address listed is [address]    If scheduled at [facility] or [facility], please arrive 15 mins early for check-in and test  prep.  There is spacious parking and easy access to the radiology department from the [facility] Heart and Vascular entrance. Please enter here and check-in with the desk attendant.   If scheduled at [facility], please arrive 30 minutes early for check-in and test prep.  Please follow these instructions carefully (unless otherwise directed):  An IV will be required for this test and Nitroglycerin will be given.  Hold all erectile dysfunction medications at least 3 days (72 hrs) prior to test. (Ie viagra, cialis, sildenafil, tadalafil, etc)   On the Night Before the Test: Be sure to Drink plenty of water. Do not consume any caffeinated/decaffeinated beverages or chocolate 12 hours prior to your test. Do not take any antihistamines 12 hours prior to your test. If the patient has contrast allergy: Patient will need a prescription for Prednisone and very clear instructions (as follows): Prednisone 50 mg - take 13 hours prior to test Take another Prednisone 50 mg 7 hours prior to test Take another Prednisone 50 mg 1 hour prior to test Take Benadryl 50 mg 1 hour prior to test Patient must complete all four doses of above prophylactic medications. Patient will need a ride after test due to Benadryl.  On the Day of the Test: Drink plenty of water until 1 hour prior to the test. Do not eat any food 1 hour prior to test. You may take your regular medications prior to the test.  Take metoprolol (Lopressor) two hours prior to test. If you take Furosemide/Hydrochlorothiazide/Spironolactone/Chlorthalidone, please HOLD on the morning of  the test. Patients who wear a continuous glucose monitor MUST remove the device prior to scanning. FEMALES- please wear underwire-free bra if available, avoid dresses & tight clothing  *For Clinical Staff only. Please instruct patient the following:* Heart Rate Medication Recommendations for Cardiac CT  Resting HR < 50 bpm  No medication  Resting HR 50-60  bpm and BP >110/50 mmHG   Consider Metoprolol tartrate 25 mg PO 90-120 min prior to scan  Resting HR 60-65 bpm and BP >110/50 mmHG  Metoprolol tartrate 50 mg PO 90-120 minutes prior to scan   Resting HR > 65 bpm and BP >110/50 mmHG  Metoprolol tartrate 100 mg PO 90-120 minutes prior to scan  Consider Ivabradine 10-15 mg PO or a calcium channel blocker for resting HR >60 bpm and contraindication to metoprolol tartrate  Consider Ivabradine 10-15 mg PO in combination with metoprolol tartrate for HR >80 bpm        After the Test: Drink plenty of water. After receiving IV contrast, you may experience a mild flushed feeling. This is normal. On occasion, you may experience a mild rash up to 24 hours after the test. This is not dangerous. If this occurs, you can take Benadryl 25 mg, Zyrtec, Claritin, or Allegra and increase your fluid intake. (Patients taking Tikosyn should avoid Benadryl, and may take Zyrtec, Claritin, or Allegra) If you experience trouble breathing, this can be serious. If it is severe call 911 IMMEDIATELY. If it is mild, please call our office.  We will call to schedule your test 2-4 weeks out understanding that some insurance companies will need an authorization prior to the service being performed.   For more information and frequently asked questions, please visit our website : [email or web address]  For non-scheduling related questions, please contact the cardiac imaging nurse navigator should you have any questions/concerns: Cardiac Imaging Nurse Navigators Direct Office Dial: [phone]   For scheduling needs, including cancellations and rescheduling, please call [place], [phone].    Follow-Up: At [facility], you and your health needs are our priority.  As part of our continuing mission to provide you with exceptional heart care, we have created designated Provider Care Teams.  These Care Teams include your primary Cardiologist (physician)  and Advanced Practice Providers (APPs -  Physician Assistants and Nurse Practitioners) who all work together to provide you with the care you need, when you need it.  We recommend signing up for the patient portal called "MyChart".  Sign up information is provided on this After Visit Summary.  MyChart is used to connect with patients for Virtual Visits (Telemedicine).  Patients are able to view lab/test results, encounter notes, upcoming appointments, etc.  Non-urgent messages can be sent to your provider as well.   To learn more about what you can do with MyChart, go to [email or web address].    Your next appointment:   3 month(s)  Provider:   You will see one of the following Advanced Practice Providers on your designated Care Team:   Randall An, PA-C  Jacolyn Reedy, PA-C      Other Instructions Thank you for choosing [facility]!

## 2023-09-18 NOTE — Assessment & Plan Note (Signed)
Patient in for follow up of recent hospitalization. Discharge summary, and laboratory and radiology data are reviewed, and any questions or concerns  are discussed. Specific issues requiring follow up are specifically addressed.  

## 2023-09-18 NOTE — Progress Notes (Signed)
 Cardiology Office Note:  .   Date:  09/18/2023  ID:  [name], DOB 30-Oct-1980, MRN [id number] PCP: Elizabeth Perches, MD  [facility] Providers Cardiologist:  Elizabeth Dell, MD     History of Present Illness: .   [name] is a 43 y.o. 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  The patient presented to [facility] ER 08/29/2023 with worsening dyspnea and orthopnea.  Reported pleuritic chest pain.  BNP 171.  High-sensitivity troponin 225>210>156.  Patient was started on IV Lasix and IV heparin.  Stat echo in the ER showed EF of 65 to 70%, mild diastolic dysfunction.  WBC 14, procalcitonin 0.44.  Patient subsequently decompensated and was placed on BiPAP.  She was still tachypneic so she was eventually intubated and transferred to [facility].  She came back positive for rhinovirus, MRSA PCR positive.  Patient was eventually extubated on 2/10.  Repeat limited echo showed EF of 60 to 65%, no wall motion abnormality.  It was felt troponin elevation was due to supply demand mismatch and no further ischemic workup was pursued inpatient.  Today, the patient has overall feeling good. She feels stressed from issues at home. She denies chest pain. She has some SOB when she walks, this is the same wince being in the hospital. No lower leg edema, orthopnea or pnd.   Studies Reviewed: [name]   EKG Interpretation Date/Time:  Thursday September 18 2023 13:09:00 EST Ventricular Rate:  73 PR Interval:  152 QRS Duration:  86 QT Interval:  370 QTC Calculation: 407 R Axis:   58  Text Interpretation: Normal sinus rhythm Normal ECG When compared with ECG of 29-Aug-2023 10:05, PREVIOUS ECG IS PRESENT Confirmed by [name], [name] ([id number]) on 09/18/2023 1:14:00 PM   Limited echo 09/04/23 1. Left  ventricular ejection fraction, by estimation, is 60 to 65%. The  left ventricle has normal function. The left ventricle has no regional  wall motion abnormalities.   2. Right ventricular systolic function is normal. The right ventricular  size is normal.   3. The mitral valve is grossly normal. No evidence of mitral valve  regurgitation. No evidence of mitral stenosis.   4. The aortic valve was not well visualized. Aortic valve regurgitation  is not visualized. No aortic stenosis is present.   5. The inferior vena cava is normal in size with greater than 50%  respiratory variability, suggesting right atrial pressure of 3 mmHg.   Echocardiogram 08/29/2023  1. Left ventricular ejection fraction, by estimation, is 65 to 70%. The  left ventricle has normal function. Left ventricular endocardial border  not optimally defined to evaluate regional wall motion - cannot exclude  basal posterolateral hypokinesis.  There is mild asymmetric left ventricular hypertrophy of the septal  segment. Left ventricular diastolic parameters are consistent with Grade I  diastolic dysfunction (impaired relaxation).   2. Right ventricular systolic function is normal. The right ventricular  size is normal. Tricuspid regurgitation signal is inadequate for assessing  PA pressure.   3. The mitral valve is grossly normal. Trivial mitral valve  regurgitation.   4. The aortic valve is tricuspid. Aortic valve regurgitation is not  visualized. No aortic stenosis is present.   5. The inferior vena cava is normal in size with greater than 50%  respiratory variability, suggesting right atrial pressure  of 3 mmHg.       Physical Exam:   VS:  BP 108/70 (BP Location: Right Arm, Cuff Size: Normal)   Pulse 72   Ht 5\' 2"  (1.575 m)   Wt 201 lb 12.8 oz (91.5 kg)   SpO2 100%   BMI 36.91 kg/m    Wt Readings from Last 3 Encounters:  09/18/23 201 lb 12.8 oz (91.5 kg)  09/17/23 200 lb (90.7 kg)  09/15/23 202 lb 6.4 oz (91.8 kg)     GEN: Well nourished, well developed in no acute distress NECK: No JVD; No carotid bruits CARDIAC: RRR, no murmurs, rubs, gallops RESPIRATORY:  +wheezing + rhonchi  ABDOMEN: Soft, non-tender, non-distended EXTREMITIES:  No edema; No deformity   ASSESSMENT AND PLAN: .    Elevated troponin - patient recently admitted for acute hypoxic respiratory failure 2/2 rhinovirus PNA and CAP found to have elevated troponin and mild volume overload.  - HS troponin 225>210>156 - no chest pain reported - she was treated with IV heparin x 48 hours.  - echo showed normal LVEF - she denies chest pain, but does have DOE - continue ASA, lipitor and metoprolol - I will order a Cardiac CTA  DOE Tobacco use - acute resp failure 2/2 rhinovirus PNA and CAP requiring intubation as above - she had poor response to IV lasix - Echo showed normal EF, normal RVSF - she reports persistent DOE - she is a smoker - may also need sleep study - will refer to pulmonology     Dispo: follow-up in 3 months  Signed, [name] Elizabeth Stall, PA-C

## 2023-09-26 ENCOUNTER — Telehealth: Payer: Self-pay | Admitting: Neurology

## 2023-09-26 ENCOUNTER — Telehealth: Payer: Self-pay

## 2023-09-26 DIAGNOSIS — H543 Unqualified visual loss, both eyes: Secondary | ICD-10-CM | POA: Diagnosis not present

## 2023-09-26 DIAGNOSIS — G40019 Localization-related (focal) (partial) idiopathic epilepsy and epileptic syndromes with seizures of localized onset, intractable, without status epilepticus: Secondary | ICD-10-CM

## 2023-09-26 DIAGNOSIS — G43909 Migraine, unspecified, not intractable, without status migrainosus: Secondary | ICD-10-CM | POA: Diagnosis not present

## 2023-09-26 DIAGNOSIS — I214 Non-ST elevation (NSTEMI) myocardial infarction: Secondary | ICD-10-CM | POA: Diagnosis not present

## 2023-09-26 DIAGNOSIS — E872 Acidosis, unspecified: Secondary | ICD-10-CM | POA: Diagnosis not present

## 2023-09-26 DIAGNOSIS — Z9181 History of falling: Secondary | ICD-10-CM | POA: Diagnosis not present

## 2023-09-26 DIAGNOSIS — E871 Hypo-osmolality and hyponatremia: Secondary | ICD-10-CM | POA: Diagnosis not present

## 2023-09-26 DIAGNOSIS — I517 Cardiomegaly: Secondary | ICD-10-CM | POA: Diagnosis not present

## 2023-09-26 DIAGNOSIS — I088 Other rheumatic multiple valve diseases: Secondary | ICD-10-CM | POA: Diagnosis not present

## 2023-09-26 DIAGNOSIS — Z7982 Long term (current) use of aspirin: Secondary | ICD-10-CM | POA: Diagnosis not present

## 2023-09-26 DIAGNOSIS — Z87891 Personal history of nicotine dependence: Secondary | ICD-10-CM | POA: Diagnosis not present

## 2023-09-26 DIAGNOSIS — J9601 Acute respiratory failure with hypoxia: Secondary | ICD-10-CM | POA: Diagnosis not present

## 2023-09-26 MED ORDER — OXCARBAZEPINE 600 MG PO TABS: ORAL_TABLET | ORAL | 3 refills | Status: DC

## 2023-09-26 MED ORDER — ZONISAMIDE 100 MG PO CAPS: ORAL_CAPSULE | ORAL | 3 refills | Status: DC

## 2023-09-26 MED ORDER — NORTRIPTYLINE HCL 25 MG PO CAPS: ORAL_CAPSULE | ORAL | 3 refills | Status: DC

## 2023-09-26 MED ORDER — BRIVARACETAM 100 MG PO TABS: 100.0000 mg | ORAL_TABLET | Freq: Two times a day (BID) | ORAL | 5 refills | Status: DC

## 2023-09-26 NOTE — Telephone Encounter (Signed)
 Copied from CRM [id number]. Topic: Clinical - Home Health Verbal Orders >> Sep 26, 2023  1:17 PM [name] D wrote: Caller/Agency: [facility] Callback Number: [phone] Service Requested: Physical Therapy Frequency: 2 times a week for 2 weeks  Any new concerns about the patient? No >> Sep 26, 2023  1:19 PM [name] wrote: Patient doesn't want to take the depo shot anymore

## 2023-09-26 NOTE — Telephone Encounter (Signed)
 Pt needs assistance for RX and issues with approval

## 2023-09-26 NOTE — Telephone Encounter (Signed)
 Rx sent, thanks

## 2023-09-26 NOTE — Telephone Encounter (Signed)
 Pt needs her RX sent [facility] so they will do the pill pack for her

## 2023-09-28 NOTE — Progress Notes (Unsigned)
 [name], female    DOB: 09-25-1980    MRN: [id number]   Brief patient profile:  43  yobf  active smoker  referred to pulmonary clinic in [place]  09/29/2023 by doe x first covid years around 2020-22 but never had covid to her knowledge   Last uip 2003  140 -150  Admit date: 08/29/2023 Discharge date: 09/05/2023   Admitted From: Home Disposition: Home Home with home health   Recommendations for Outpatient Follow-up:  Follow up with PCP in 1-2 weeks Please obtain BMP/CBC in one week      Discharge summary: 43 year old with history of seizures, left temporal lobe encephalocele s/p resection, chronic hyponatremia, smoker, marijuana use, depression who presented to [facility] on 2/7 with shortness of breath, chest pain,  chills,  fatigue and dry cough for 3 days.  In the emergency room she was tachycardic and tachypneic with respiratory rate of 40.  Initial temperature 99.  Initially on nasal cannula oxygen.  COVID flu and RSV negative.  Mildly elevated troponins.  EKG nonischemic.  CTA chest with no PE, diffuse groundglass densities concerning for pneumonia.  WBC 14.  Procalcitonin 0.44.  Patient was started on Rocephin and azithromycin, nasal cannula oxygen.  Cardiology consulted.  Echocardiogram with normal ejection fraction. Started on IV heparin for non-STEMI.  Patient subsequently decompensated and placed on BiPAP.  She was still tachypneic so she was intubated and placed on vent and transferred to [facility].   2/7 admitted to [facility] with chest pain and SOB Intubated  2/8 transferred to ICU at  [facility].  Continuing antibiotics, IV diuresis, systemic steroids, following up on cultures remains on heparin. RVP positive for rhinovirus, ustrep neg, MRSA PCR pos. Sig bronchospasm. BDs and ICS optimized.  2/9 still having some vent synchrony difficulty. Increased prop ceiling to 75. Changed vent to PCV seems to be more comfortable. Metabolic acidosis improved. Still  w/ sig wheeze. Change steroids to 40mg  bid  2/10, extubated to nasal cannula oxygen. 2/11, transferred to medical floor. 2/14, all symptoms improved.  On room air.  Echocardiogram with normal ejection fraction, no evidence of regional wall motion abnormalities.  Stable to discharge home.   Acute hypoxemic respiratory failure secondary to rhinovirus pneumonia, superadded community-acquired pneumonia. Patient required mechanical ventilation and nasal canula oxygen and now on room air.  Patient completed 5 days of Rocephin and azithromycin.   Rhinovirus was positive. Patient was also on a steroid for bronchospasm.  Symptoms completely improved.  Will discontinue further steroids.   Non-STEMI: Troponin elevated and flat and trended down.  Received 48 hours of IV heparin.  Echocardiogram was grossly normal with some wall motion abnormality.  Repeat echocardiogram without any evidence of regional wall motion abnormalities. Discussed with cardiology.  They will schedule outpatient follow-up.  Currently recommended to continue aspirin and Lipitor.  Will add metoprolol.   History of epilepsy: On Briviact, zonisamide and Trileptal.  No new seizure. Chronic hyponatremia: Stable at around baseline. Smoker: Counseled to quit.  Nicotine patch. Anxiety depression: On BuSpar and nortriptyline.   Medically stable for discharge.   Discharge Diagnoses:  Principal Problem:   Acute hypoxemic respiratory failure (HCC)   Convulsions (HCC)   Seizure (HCC)   GAD (generalized anxiety disorder)   Morbid obesity (HCC)   Elevated troponin   Acute respiratory failure with hypoxia [facility])     History of Present Illness  09/29/2023  Pulmonary/ 1st office eval/ [name] / [place] Office at wt  204  Chief Complaint  Patient presents with   Consult    DOE   Dyspnea:  hasn't done any grocery shopping in several years due to sob  Cough: no cough  Sleep: flat bed with 2 pillows  SABA use: none  02: none     No  obvious day to day or daytime pattern/variability or assoc excess/ purulent sputum or mucus plugs or hemoptysis or cp or chest tightness, subjective wheeze or overt sinus or hb symptoms.    Also denies any obvious fluctuation of symptoms with weather or environmental changes or other aggravating or alleviating factors except as outlined above   No unusual exposure hx or h/o childhood pna/ asthma or knowledge of premature birth.  Current Allergies, Complete Past Medical History, Past Surgical History, Family History, and Social History were reviewed in [employer] record.  ROS  The following are not active complaints unless bolded Hoarseness, sore throat, dysphagia, dental problems, itching, sneezing,  nasal congestion or discharge of excess mucus or purulent secretions, ear ache,   fever, chills, sweats, unintended wt loss or wt gain, classically pleuritic or exertional cp,  orthopnea pnd or arm/hand swelling  or leg swelling, presyncope, palpitations, abdominal pain, anorexia, nausea, vomiting, diarrhea  or change in bowel habits or change in bladder habits, change in stools or change in urine, dysuria, hematuria,  rash, arthralgias, visual complaints, headache, numbness, weakness or ataxia or problems with walking or coordination,  change in mood or  memory.            Outpatient Medications Prior to Visit  Medication Sig Dispense Refill   aspirin 81 MG chewable tablet Chew 1 tablet (81 mg total) by mouth daily. 90 tablet 0   atorvastatin (LIPITOR) 40 MG tablet Take 1 tablet (40 mg total) by mouth daily. 90 tablet 0   brivaracetam (BRIVIACT) 100 MG TABS tablet Take 1 tablet (100 mg total) by mouth 2 (two) times daily. 60 tablet 5   busPIRone (BUSPAR) 5 MG tablet TAKE 1 TABLET(5 MG) BY MOUTH TWICE DAILY 60 tablet 2   [START ON 10/08/2023] Erenumab-aooe (AIMOVIG) 140 MG/ML SOAJ Inject 140 mg into the skin every 30 (thirty) days. 1.12 mL 11   famciclovir (FAMVIR) 250 MG  tablet TAKE ONE (1) TABLET BY MOUTH TWICE DAILY 60 tablet 10   ketoconazole (NIZORAL) 2 % cream Apply 1 Application topically 2 (two) times daily as needed for irritation. 60 g 1   medroxyPROGESTERone (DEPO-PROVERA) 150 MG/ML injection ADMINISTER 1 ML(150 MG) IN THE MUSCLE EVERY 3 MONTHS 1 mL 4   meloxicam (MOBIC) 15 MG tablet Take 1 tablet (15 mg total) by mouth daily. 14 tablet 0   metoprolol tartrate (LOPRESSOR) 100 MG tablet Take 1 tablet (100 mg total) by mouth as directed. Take 2 Hours prior to Cardiac CT. Do not take Lopressor 25 mg on this day. 1 tablet 0   metoprolol tartrate (LOPRESSOR) 25 MG tablet Take 1 tablet (25 mg total) by mouth 2 (two) times daily. 60 tablet 0   nortriptyline (PAMELOR) 25 MG capsule TAKE 2 CAPSULES BY MOUTH EVERY NIGHT 180 capsule 3   oxcarbazepine (TRILEPTAL) 600 MG tablet Take 1 tablet two times daily . 180 tablet 3   terbinafine (LAMISIL) 250 MG tablet Take 1 tablet (250 mg total) by mouth daily. 90 tablet 0   tiZANidine (ZANAFLEX) 4 MG tablet TAKE 1 TABLET BY MOUTH EVERY 6 HOURS AS NEEDED FOR MUSCLE SPASMS 30 tablet 10   triamcinolone (KENALOG) 0.025 %  ointment Apply 1 Application topically 2 (two) times daily as needed (itching). 30 g 0   Ubrogepant (UBRELVY) 100 MG TABS Take 1 tablet as needed at onset of migraine. Do not take more than 3 a week 10 tablet 5   Vitamin D, Ergocalciferol, (DRISDOL) 1.25 MG (50000 UNIT) CAPS capsule Take 1 capsule (50,000 Units total) by mouth every 7 (seven) days. 12 capsule 2   zonisamide (ZONEGRAN) 100 MG capsule TAKE ONE (1) CAPSULE BY MOUTH ONCE DAILY IN THE MORNING AND TAKE FIVE (5) CAPSULES IN THE EVENING 540 capsule 3   No facility-administered medications prior to visit.    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 ([facility])   Vision loss, bilateral 05/08/2021      Objective:     BP 109/72   Pulse 83   Ht 5\' 2"  (1.575 m)   Wt 203 lb (92.1 kg)   SpO2 96% Comment: ROOM AIR  BMI 37.13 kg/m   SpO2: 96 % (ROOM AIR)  Amb bf very confused with details of care    HEENT : Oropharynx  clear     Nasal turbinates nl    NECK :  without  apparent JVD/ palpable Nodes/TM    LUNGS: no acc muscle use,  Nl contour chest which is clear to A and P bilaterally without cough on insp or exp maneuvers   CV:  RRR  no s3 or murmur or increase in P2, and no edema   ABD:  soft and nontender   MS:  Gait nl   ext warm without deformities Or obvious joint restrictions  calf tenderness, cyanosis or clubbing    SKIN: warm and dry without lesions    NEURO:  alert, approp, nl sensorium with  no motor or cerebellar deficits apparent.      I personally reviewed images and agree with radiology impression as follows:  CXR:   portable  09/01/13  Improvement in vascular congestion when compared with the previous exam. Persistent bibasilar atelectasis is note  CT chest due 10/03/23 with CT coronary study scheduled       Assessment   DOE (dyspnea on exertion) Active smoker s/p rhinovirus infection> acute  resp failure/ vent dep > see admit  08/29/23  - 09/29/2023   Walked on RA  x  3  lap(s) =  approx 450  ft  @ slow to mod pace, stopped due to end of study s sob or cp with lowest 02 sats 98%  - PFTs rec 09/29/2023 >>>   - CT chest scheduled for 10/03/23 as part of Coronary study     She may have mild copd but main rec at this point is to stop smoking and stay active to prevent wt gain from d/c cigs and de-conditioning from inactivity   >>> pfts/ ct chest  ordered and will be checked to see if pulmonary f/u is needed   Each maintenance medication was reviewed in detail including emphasizing most  importantly the difference between maintenance and prns and under what circumstances the prns are to be triggered using an action plan  format where appropriate.  Total time for H and P, chart review, counseling,  directly observing portions of ambulatory 02 saturation study/ and generating customized AVS unique to this office visit / same day charting = 30 min new pt eval                  Cigarette smoker 4-5 min discussion re active cigarette smoking in addition to office E&M  Ask about tobacco use:   ongoing  Advise quitting   I took an extended  opportunity with this patient to outline the consequences of continued cigarette use  in airway disorders based on all the data we have from the multiple national lung health studies (perfomed over decades at millions of dollars in cost)  indicating that smoking cessation, not choice of inhalers or pulmonary physicians, is the most important aspect of her care.   Assess willingness:  Not committed at this point Assist in quit attempt:  Per PCP when ready Arrange follow up:   Follow up per Primary Care planned           Sandrea Hughs, MD 09/29/2023

## 2023-09-29 ENCOUNTER — Encounter: Payer: Self-pay | Admitting: Internal Medicine

## 2023-09-29 ENCOUNTER — Ambulatory Visit: Payer: 59 | Admitting: Internal Medicine

## 2023-09-29 VITALS — BP 109/72 | HR 83 | Ht 62.0 in | Wt 203.0 lb

## 2023-09-29 DIAGNOSIS — F17210 Nicotine dependence, cigarettes, uncomplicated: Secondary | ICD-10-CM

## 2023-09-29 DIAGNOSIS — R0609 Other forms of dyspnea: Secondary | ICD-10-CM | POA: Insufficient documentation

## 2023-09-29 DIAGNOSIS — E872 Acidosis, unspecified: Secondary | ICD-10-CM | POA: Diagnosis not present

## 2023-09-29 DIAGNOSIS — Z87891 Personal history of nicotine dependence: Secondary | ICD-10-CM | POA: Diagnosis not present

## 2023-09-29 DIAGNOSIS — J9601 Acute respiratory failure with hypoxia: Secondary | ICD-10-CM

## 2023-09-29 DIAGNOSIS — I088 Other rheumatic multiple valve diseases: Secondary | ICD-10-CM | POA: Diagnosis not present

## 2023-09-29 DIAGNOSIS — G43909 Migraine, unspecified, not intractable, without status migrainosus: Secondary | ICD-10-CM | POA: Diagnosis not present

## 2023-09-29 DIAGNOSIS — I214 Non-ST elevation (NSTEMI) myocardial infarction: Secondary | ICD-10-CM | POA: Diagnosis not present

## 2023-09-29 DIAGNOSIS — E871 Hypo-osmolality and hyponatremia: Secondary | ICD-10-CM | POA: Diagnosis not present

## 2023-09-29 DIAGNOSIS — H543 Unqualified visual loss, both eyes: Secondary | ICD-10-CM | POA: Diagnosis not present

## 2023-09-29 DIAGNOSIS — Z9181 History of falling: Secondary | ICD-10-CM | POA: Diagnosis not present

## 2023-09-29 DIAGNOSIS — I517 Cardiomegaly: Secondary | ICD-10-CM | POA: Diagnosis not present

## 2023-09-29 DIAGNOSIS — Z7982 Long term (current) use of aspirin: Secondary | ICD-10-CM | POA: Diagnosis not present

## 2023-09-29 NOTE — Assessment & Plan Note (Addendum)
 Active smoker s/p rhinovirus infection> acute  resp failure/ vent dep > see admit  08/29/23  - 09/29/2023   Walked on RA  x  3  lap(s) =  approx 450  ft  @ slow to mod pace, stopped due to end of study s sob or cp with lowest 02 sats 98%  - PFTs rec 09/29/2023 >>>   - CT chest scheduled for 10/03/23 as part of Coronary study     She may have mild copd but main rec at this point is to stop smoking and stay active to prevent wt gain from d/c cigs and de-conditioning from inactivity   >>> pfts/ ct chest  ordered and will be checked to see if pulmonary f/u is needed   Each maintenance medication was reviewed in detail including emphasizing most importantly the difference between maintenance and prns and under what circumstances the prns are to be triggered using an action plan format where appropriate.  Total time for H and P, chart review, counseling,  directly observing portions of ambulatory 02 saturation study/ and generating customized AVS unique to this office visit / same day charting = 30 min new pt eval

## 2023-09-29 NOTE — Patient Instructions (Signed)
 My office will be contacting you by phone for referral for Home sleep study and pfts - if you don't hear back from my office within one week please call us back or notify us thru MyChart and we'll address it right away.  I will arrange follow up according to the results of these tests    The key is to stop smoking completely before smoking completely stops you - I don't think it's too late.   Pulmonary follow up is as needed

## 2023-09-29 NOTE — Assessment & Plan Note (Signed)

## 2023-10-02 ENCOUNTER — Telehealth (HOSPITAL_COMMUNITY): Payer: Self-pay | Admitting: *Deleted

## 2023-10-02 DIAGNOSIS — Z7982 Long term (current) use of aspirin: Secondary | ICD-10-CM | POA: Diagnosis not present

## 2023-10-02 DIAGNOSIS — I517 Cardiomegaly: Secondary | ICD-10-CM | POA: Diagnosis not present

## 2023-10-02 DIAGNOSIS — E872 Acidosis, unspecified: Secondary | ICD-10-CM | POA: Diagnosis not present

## 2023-10-02 DIAGNOSIS — Z87891 Personal history of nicotine dependence: Secondary | ICD-10-CM | POA: Diagnosis not present

## 2023-10-02 DIAGNOSIS — J9601 Acute respiratory failure with hypoxia: Secondary | ICD-10-CM | POA: Diagnosis not present

## 2023-10-02 DIAGNOSIS — G43909 Migraine, unspecified, not intractable, without status migrainosus: Secondary | ICD-10-CM | POA: Diagnosis not present

## 2023-10-02 DIAGNOSIS — Z9181 History of falling: Secondary | ICD-10-CM | POA: Diagnosis not present

## 2023-10-02 DIAGNOSIS — H543 Unqualified visual loss, both eyes: Secondary | ICD-10-CM | POA: Diagnosis not present

## 2023-10-02 DIAGNOSIS — E871 Hypo-osmolality and hyponatremia: Secondary | ICD-10-CM | POA: Diagnosis not present

## 2023-10-02 DIAGNOSIS — I088 Other rheumatic multiple valve diseases: Secondary | ICD-10-CM | POA: Diagnosis not present

## 2023-10-02 DIAGNOSIS — I214 Non-ST elevation (NSTEMI) myocardial infarction: Secondary | ICD-10-CM | POA: Diagnosis not present

## 2023-10-02 NOTE — Telephone Encounter (Signed)
 Patient returning call about her upcoming cardiac imaging study; pt verbalizes understanding of appt date/time, parking situation and where to check in, pre-test NPO status and medications ordered, and verified current allergies; name and call back number provided for further questions should they arise  Larey Brick RN Navigator Cardiac Imaging [facility] [phone] office [phone] cell  Patient to hold her daily metoprolol and take 100mg  metoprolol tartrate two hours prior to her cardiac CT scan. She is aware to arrive at 2 PM.

## 2023-10-02 NOTE — Telephone Encounter (Signed)
 Attempted to call patient regarding upcoming cardiac CT appointment. Left message on voicemail with name and callback number  Larey Brick RN Navigator Cardiac Imaging [facility] [phone] Office [phone] Cell

## 2023-10-03 ENCOUNTER — Ambulatory Visit (HOSPITAL_COMMUNITY)
Admission: RE | Admit: 2023-10-03 | Discharge: 2023-10-03 | Disposition: A | Discharge status: Home / Self Care | Payer: 59 | Source: Ambulatory Visit | Attending: Medical | Admitting: Medical

## 2023-10-03 ENCOUNTER — Other Ambulatory Visit: Payer: Self-pay | Admitting: Family Medicine

## 2023-10-03 DIAGNOSIS — R7989 Other specified abnormal findings of blood chemistry: Secondary | ICD-10-CM | POA: Diagnosis not present

## 2023-10-03 DIAGNOSIS — I2510 Atherosclerotic heart disease of native coronary artery without angina pectoris: Secondary | ICD-10-CM | POA: Diagnosis not present

## 2023-10-03 DIAGNOSIS — R0609 Other forms of dyspnea: Secondary | ICD-10-CM | POA: Diagnosis not present

## 2023-10-03 MED ORDER — IOHEXOL 350 MG/ML SOLN
95.0000 mL | Freq: Once | INTRAVENOUS | Status: AC | PRN
  Administered 2023-10-03: 95 mL via INTRAVENOUS

## 2023-10-03 MED ORDER — METOPROLOL TARTRATE 5 MG/5ML IV SOLN: 10.0000 mg | Freq: Once | INTRAVENOUS | Status: DC | PRN

## 2023-10-03 MED ORDER — NITROGLYCERIN 0.4 MG SL SUBL
0.8000 mg | SUBLINGUAL_TABLET | Freq: Once | SUBLINGUAL | Status: AC
  Administered 2023-10-03: 0.8 mg via SUBLINGUAL

## 2023-10-03 MED ORDER — NITROGLYCERIN 0.4 MG SL SUBL
SUBLINGUAL_TABLET | SUBLINGUAL | Status: AC
Start: 2023-10-03 — End: ?
  Filled 2023-10-03: qty 2

## 2023-10-03 MED ORDER — DILTIAZEM HCL 25 MG/5ML IV SOLN: 10.0000 mg | INTRAVENOUS | Status: DC | PRN

## 2023-10-06 ENCOUNTER — Telehealth: Payer: Self-pay | Admitting: Cardiology

## 2023-10-06 NOTE — Telephone Encounter (Signed)
Patient calling in about her results. Please advise

## 2023-10-06 NOTE — Telephone Encounter (Signed)
 10/06/23  3:11 PM Cadence Fransico Michael PA-C reviewed your recent test and noted the following:   Cardiac CTA showed coronary calcium score of o, mild plaque in the RCA. Overall reassuring  This MyChart message has not been read.          Left message to return call.

## 2023-10-07 DIAGNOSIS — H543 Unqualified visual loss, both eyes: Secondary | ICD-10-CM | POA: Diagnosis not present

## 2023-10-07 DIAGNOSIS — E871 Hypo-osmolality and hyponatremia: Secondary | ICD-10-CM | POA: Diagnosis not present

## 2023-10-07 DIAGNOSIS — I517 Cardiomegaly: Secondary | ICD-10-CM | POA: Diagnosis not present

## 2023-10-07 DIAGNOSIS — Z7982 Long term (current) use of aspirin: Secondary | ICD-10-CM | POA: Diagnosis not present

## 2023-10-07 DIAGNOSIS — Z9181 History of falling: Secondary | ICD-10-CM | POA: Diagnosis not present

## 2023-10-07 DIAGNOSIS — J9601 Acute respiratory failure with hypoxia: Secondary | ICD-10-CM | POA: Diagnosis not present

## 2023-10-07 DIAGNOSIS — I088 Other rheumatic multiple valve diseases: Secondary | ICD-10-CM | POA: Diagnosis not present

## 2023-10-07 DIAGNOSIS — E872 Acidosis, unspecified: Secondary | ICD-10-CM | POA: Diagnosis not present

## 2023-10-07 DIAGNOSIS — Z87891 Personal history of nicotine dependence: Secondary | ICD-10-CM | POA: Diagnosis not present

## 2023-10-07 DIAGNOSIS — G43909 Migraine, unspecified, not intractable, without status migrainosus: Secondary | ICD-10-CM | POA: Diagnosis not present

## 2023-10-07 DIAGNOSIS — I214 Non-ST elevation (NSTEMI) myocardial infarction: Secondary | ICD-10-CM | POA: Diagnosis not present

## 2023-10-07 NOTE — Telephone Encounter (Signed)
 Left a message for patient to call office regarding testing results.

## 2023-10-07 NOTE — Telephone Encounter (Signed)
 The patient has been notified of the result and verbalized understanding.  All questions (if any) were answered. Roseanne Reno, [facility] 10/07/2023 9:58 AM

## 2023-10-07 NOTE — Telephone Encounter (Signed)
 Patient returned staff call regarding results.

## 2023-10-13 ENCOUNTER — Telehealth: Payer: Self-pay | Admitting: Neurology

## 2023-10-13 NOTE — Telephone Encounter (Signed)
 1. Which medications need to be refilled? (please list name of each medication and dose if known) zonisamide (ZONEGRAN) 100 MG capsule [[id number]]   2. Which pharmacy/location (including street and city if local pharmacy) is medication to be sent to? [id number] - [place], [place] - [address]   3. Do they need a 30 day or 90 day supply?

## 2023-10-14 MED ORDER — ZONISAMIDE 100 MG PO CAPS: ORAL_CAPSULE | ORAL | 0 refills | Status: DC

## 2023-10-14 MED ORDER — ZONISAMIDE 100 MG PO CAPS: ORAL_CAPSULE | ORAL | 2 refills | Status: DC

## 2023-10-14 NOTE — Addendum Note (Signed)
 Addended by: Dimas Chyle on: 10/14/2023 08:12 AM   Modules accepted: Orders

## 2023-10-14 NOTE — Telephone Encounter (Signed)
[facility]

## 2023-10-14 NOTE — Telephone Encounter (Signed)
Refills sent in for pt.  

## 2023-10-24 DIAGNOSIS — J121 Respiratory syncytial virus pneumonia: Secondary | ICD-10-CM | POA: Diagnosis not present

## 2023-10-24 DIAGNOSIS — I214 Non-ST elevation (NSTEMI) myocardial infarction: Secondary | ICD-10-CM | POA: Diagnosis not present

## 2023-10-24 DIAGNOSIS — E871 Hypo-osmolality and hyponatremia: Secondary | ICD-10-CM | POA: Diagnosis not present

## 2023-10-24 DIAGNOSIS — I517 Cardiomegaly: Secondary | ICD-10-CM | POA: Diagnosis not present

## 2023-10-24 DIAGNOSIS — E872 Acidosis, unspecified: Secondary | ICD-10-CM | POA: Diagnosis not present

## 2023-10-24 DIAGNOSIS — H543 Unqualified visual loss, both eyes: Secondary | ICD-10-CM | POA: Diagnosis not present

## 2023-10-24 DIAGNOSIS — G43909 Migraine, unspecified, not intractable, without status migrainosus: Secondary | ICD-10-CM | POA: Diagnosis not present

## 2023-10-24 DIAGNOSIS — G40909 Epilepsy, unspecified, not intractable, without status epilepticus: Secondary | ICD-10-CM

## 2023-10-24 DIAGNOSIS — J9601 Acute respiratory failure with hypoxia: Secondary | ICD-10-CM | POA: Diagnosis not present

## 2023-10-24 DIAGNOSIS — F411 Generalized anxiety disorder: Secondary | ICD-10-CM

## 2023-10-25 DIAGNOSIS — S0990XA Unspecified injury of head, initial encounter: Secondary | ICD-10-CM | POA: Diagnosis not present

## 2023-10-25 DIAGNOSIS — R569 Unspecified convulsions: Secondary | ICD-10-CM | POA: Diagnosis not present

## 2023-10-25 DIAGNOSIS — W19XXXA Unspecified fall, initial encounter: Secondary | ICD-10-CM | POA: Diagnosis not present

## 2023-10-27 ENCOUNTER — Encounter (HOSPITAL_COMMUNITY): Payer: Self-pay

## 2023-10-27 ENCOUNTER — Emergency Department (HOSPITAL_COMMUNITY)

## 2023-10-27 ENCOUNTER — Other Ambulatory Visit: Payer: Self-pay

## 2023-10-27 ENCOUNTER — Ambulatory Visit: Payer: Self-pay

## 2023-10-27 ENCOUNTER — Emergency Department (HOSPITAL_COMMUNITY)
Admission: EM | Admit: 2023-10-27 | Discharge: 2023-10-27 | Disposition: A | Discharge status: Home / Self Care | Attending: Emergency Medicine | Admitting: Emergency Medicine

## 2023-10-27 DIAGNOSIS — R569 Unspecified convulsions: Secondary | ICD-10-CM | POA: Diagnosis not present

## 2023-10-27 DIAGNOSIS — X58XXXA Exposure to other specified factors, initial encounter: Secondary | ICD-10-CM | POA: Diagnosis not present

## 2023-10-27 DIAGNOSIS — R4020 Unspecified coma: Secondary | ICD-10-CM

## 2023-10-27 DIAGNOSIS — Z7982 Long term (current) use of aspirin: Secondary | ICD-10-CM | POA: Insufficient documentation

## 2023-10-27 DIAGNOSIS — Z23 Encounter for immunization: Secondary | ICD-10-CM | POA: Diagnosis not present

## 2023-10-27 DIAGNOSIS — S0083XA Contusion of other part of head, initial encounter: Secondary | ICD-10-CM | POA: Insufficient documentation

## 2023-10-27 DIAGNOSIS — S069X9A Unspecified intracranial injury with loss of consciousness of unspecified duration, initial encounter: Secondary | ICD-10-CM | POA: Diagnosis not present

## 2023-10-27 DIAGNOSIS — E871 Hypo-osmolality and hyponatremia: Secondary | ICD-10-CM

## 2023-10-27 DIAGNOSIS — S0990XA Unspecified injury of head, initial encounter: Secondary | ICD-10-CM | POA: Diagnosis not present

## 2023-10-27 DIAGNOSIS — G9389 Other specified disorders of brain: Secondary | ICD-10-CM | POA: Diagnosis not present

## 2023-10-27 DIAGNOSIS — R55 Syncope and collapse: Secondary | ICD-10-CM | POA: Diagnosis not present

## 2023-10-27 LAB — COMPREHENSIVE METABOLIC PANEL WITH GFR
ALT: 15 U/L (ref 0–44)
AST: 17 U/L (ref 15–41)
Albumin: 3.1 g/dL — ABNORMAL LOW (ref 3.5–5.0)
Alkaline Phosphatase: 125 U/L (ref 38–126)
Anion gap: 10 (ref 5–15)
BUN: <5 mg/dL — ABNORMAL LOW (ref 6–20)
CO2: 19 mmol/L — ABNORMAL LOW (ref 22–32)
Calcium: 8.9 mg/dL (ref 8.9–10.3)
Chloride: 100 mmol/L (ref 98–111)
Creatinine, Ser: 0.82 mg/dL (ref 0.44–1.00)
GFR, Estimated: >60 mL/min (ref >60)
Glucose, Bld: 96 mg/dL (ref 70–99)
Potassium: 4 mmol/L (ref 3.5–5.1)
Sodium: 129 mmol/L — ABNORMAL LOW (ref 135–145)
Total Bilirubin: 0.2 mg/dL (ref 0.0–1.2)
Total Protein: 7.2 g/dL (ref 6.5–8.1)

## 2023-10-27 LAB — RAPID URINE DRUG SCREEN, HOSP PERFORMED
Amphetamines: NOT DETECTED
Barbiturates: NOT DETECTED
Benzodiazepines: NOT DETECTED
Cocaine: NOT DETECTED
Opiates: NOT DETECTED
Tetrahydrocannabinol: POSITIVE — AB

## 2023-10-27 LAB — CBC WITH DIFFERENTIAL/PLATELET
Abs Immature Granulocytes: 0.01 10*3/uL (ref 0.00–0.07)
Basophils Absolute: 0 10*3/uL (ref 0.0–0.1)
Basophils Relative: 1 %
Eosinophils Absolute: 0.1 10*3/uL (ref 0.0–0.5)
Eosinophils Relative: 2 %
HCT: 34.1 % — ABNORMAL LOW (ref 36.0–46.0)
Hemoglobin: 10.9 g/dL — ABNORMAL LOW (ref 12.0–15.0)
Immature Granulocytes: 0 %
Lymphocytes Relative: 37 %
Lymphs Abs: 1.6 10*3/uL (ref 0.7–4.0)
MCH: 29.1 pg (ref 26.0–34.0)
MCHC: 32 g/dL (ref 30.0–36.0)
MCV: 91.2 fL (ref 80.0–100.0)
Monocytes Absolute: 0.4 10*3/uL (ref 0.1–1.0)
Monocytes Relative: 10 %
Neutro Abs: 2.2 10*3/uL (ref 1.7–7.7)
Neutrophils Relative %: 50 %
Platelets: 337 10*3/uL (ref 150–400)
RBC: 3.74 MIL/uL — ABNORMAL LOW (ref 3.87–5.11)
RDW: 15 % (ref 11.5–15.5)
WBC: 4.3 10*3/uL (ref 4.0–10.5)
nRBC: 0 % (ref 0.0–0.2)

## 2023-10-27 LAB — URINALYSIS, ROUTINE W REFLEX MICROSCOPIC
Bilirubin Urine: NEGATIVE
Glucose, UA: NEGATIVE mg/dL
Hgb urine dipstick: NEGATIVE
Ketones, ur: NEGATIVE mg/dL
Leukocytes,Ua: NEGATIVE
Nitrite: NEGATIVE
Protein, ur: NEGATIVE mg/dL
Specific Gravity, Urine: 1.006 (ref 1.005–1.030)
pH: 7 (ref 5.0–8.0)

## 2023-10-27 LAB — MAGNESIUM: Magnesium: 1.9 mg/dL (ref 1.7–2.4)

## 2023-10-27 LAB — CBG MONITORING, ED: Glucose-Capillary: 88 mg/dL (ref 70–99)

## 2023-10-27 LAB — POC URINE PREG, ED: Preg Test, Ur: NEGATIVE

## 2023-10-27 LAB — ETHANOL: Alcohol, Ethyl (B): <10 mg/dL (ref <10)

## 2023-10-27 MED ORDER — TETANUS-DIPHTH-ACELL PERTUSSIS 5-2.5-18.5 LF-MCG/0.5 IM SUSY
0.5000 mL | PREFILLED_SYRINGE | Freq: Once | INTRAMUSCULAR | Status: AC
  Administered 2023-10-27: 0.5 mL via INTRAMUSCULAR
  Filled 2023-10-27: qty 0.5

## 2023-10-27 MED ORDER — SODIUM CHLORIDE 0.9 % IV BOLUS
1000.0000 mL | Freq: Once | INTRAVENOUS | Status: AC
  Administered 2023-10-27: 1000 mL via INTRAVENOUS

## 2023-10-27 NOTE — ED Provider Notes (Signed)
 [place] EMERGENCY DEPARTMENT AT [facility] Provider Note   CSN: [id number] Arrival date & time: 10/27/23  1325     History {Add pertinent medical, surgical, social history, OB history to HPI:1} Chief Complaint  Patient presents with   Seizures    [name] is a 43 y.o. female.  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  Patient reports that she was going to have a bowel movement last night when she bent over to wipe and woke up on the ground.  Thinks that she hit her head and may have had a seizure woke up on the ground.  Thinks that she hit her head and may have had a seizure but does not recall either event.  Does have a bruise and small abrasion to her forehead.  Not on blood thinners.  Reports that she has been compliant with her oxcarbazepine, Briviact, and zonisamide.  No seizures since August.  No recent illnesses.  Denies alcohol use.  Still smokes marijuana occasionally.       Home Medications Prior to Admission medications   Medication Sig Start Date End Date Taking? Authorizing Provider  aspirin 81 MG chewable tablet Chew 1 tablet (81 mg total) by mouth daily. 09/06/23   Dorcas Carrow, MD  atorvastatin (LIPITOR) 40 MG tablet Take 1 tablet (40 mg total) by mouth daily. 09/06/23 12/05/23  Dorcas Carrow, MD  brivaracetam (BRIVIACT) 100 MG TABS tablet Take 1 tablet (100 mg total) by mouth 2 (two) times daily. 09/26/23   Van Clines, MD  busPIRone (BUSPAR) 5 MG tablet TAKE 1 TABLET(5 MG) BY MOUTH TWICE DAILY 10/06/23   Kerri Perches, MD  Erenumab-aooe (AIMOVIG) 140 MG/ML SOAJ Inject 140 mg into the skin every 30 (thirty) days. 10/08/23   Van Clines, MD  famciclovir [facility]) 250 MG tablet TAKE ONE (1) TABLET BY MOUTH TWICE DAILY 08/25/23   Kerri Perches, MD  ketoconazole (NIZORAL) 2 % cream Apply 1  Application topically 2 (two) times daily as needed for irritation. 05/08/23   Kerri Perches, MD  medroxyPROGESTERone (DEPO-PROVERA) 150 MG/ML injection ADMINISTER 1 ML(150 MG) IN THE MUSCLE EVERY 3 MONTHS 08/26/23   Cyril Mourning A, NP  meloxicam (MOBIC) 15 MG tablet Take 1 tablet (15 mg total) by mouth daily. 08/14/23   Kerri Perches, MD  metoprolol tartrate (LOPRESSOR) 100 MG tablet Take 1 tablet (100 mg total) by mouth as directed. Take 2 Hours prior to Cardiac CT. Do not take Lopressor 25 mg on this day. 09/18/23   Furth, Cadence H, PA-C  metoprolol tartrate (LOPRESSOR) 25 MG tablet Take 1 tablet (25 mg total) by mouth 2 (two) times daily. 09/05/23 10/05/23  Dorcas Carrow, MD  nortriptyline (PAMELOR) 25 MG capsule TAKE 2 CAPSULES BY MOUTH EVERY NIGHT 09/26/23   Van Clines, MD  oxcarbazepine (TRILEPTAL) 600 MG tablet Take 1 tablet two times daily . 09/26/23   Van Clines, MD  terbinafine (LAMISIL) 250 MG tablet Take 1 tablet (250 mg total) by mouth daily. 01/13/23   Gardenia Phlegm, MD  tiZANidine (ZANAFLEX) 4 MG tablet TAKE 1 TABLET BY MOUTH EVERY 6 HOURS AS NEEDED FOR MUSCLE SPASMS 11/25/22   Van Clines, MD  triamcinolone (KENALOG) 0.025 % ointment Apply 1 Application topically 2 (two) times daily as needed (itching). 11/11/22   Gardenia Phlegm, MD  Ubrogepant (  UBRELVY) 100 MG TABS Take 1 tablet as needed at onset of migraine. Do not take more than 3 a week 09/15/23   Van Clines, MD  Vitamin D, Ergocalciferol, (DRISDOL) 1.25 MG (50000 UNIT) CAPS capsule Take 1 capsule (50,000 Units total) by mouth every 7 (seven) days. 08/14/23   Kerri Perches, MD  zonisamide (ZONEGRAN) 100 MG capsule TAKE ONE (1) CAPSULE BY MOUTH ONCE DAILY IN THE MORNING AND TAKE FIVE (5) CAPSULES IN THE EVENING 10/14/23   Van Clines, MD      Allergies    Morphine    Review of Systems   Review of Systems  Physical Exam Updated Vital Signs BP (!) 161/94   Pulse 80   Temp 99.2 F (37.3 C)  (Oral)   Resp 16   Ht 5\' 2"  (1.575 m)   Wt 92.1 kg   SpO2 100%   BMI 37.14 kg/m  Physical Exam Vitals and nursing note reviewed.  Constitutional:      General: She is not in acute distress.    Appearance: She is well-developed.  HENT:     Head: Normocephalic.     Comments: Small hematoma and abrasion to the forehead    Right Ear: External ear normal.     Left Ear: External ear normal.     Nose: Nose normal.  Eyes:     Extraocular Movements: Extraocular movements intact.     Conjunctiva/sclera: Conjunctivae normal.     Pupils: Pupils are equal, round, and reactive to light.  Cardiovascular:     Rate and Rhythm: Normal rate and regular rhythm.     Heart sounds: No murmur heard. Pulmonary:     Effort: Pulmonary effort is normal. No respiratory distress.     Breath sounds: Normal breath sounds.  Abdominal:     General: Abdomen is flat. There is no distension.     Palpations: Abdomen is soft. There is no mass.     Tenderness: There is no abdominal tenderness. There is no guarding.  Musculoskeletal:     Cervical back: Normal range of motion and neck supple.     Right lower leg: No edema.     Left lower leg: No edema.  Skin:    General: Skin is warm and dry.  Neurological:     Mental Status: She is alert and oriented to person, place, and time. Mental status is at baseline.     Cranial Nerves: No cranial nerve deficit.     Sensory: No sensory deficit.     Motor: No weakness.     Coordination: Coordination normal.  Psychiatric:        Mood and Affect: Mood normal.     ED Results / Procedures / Treatments   Labs (all labs ordered are listed, but only abnormal results are displayed) Labs Reviewed  CBC WITH DIFFERENTIAL/PLATELET - Abnormal; Notable for the following components:      Result Value   RBC 3.74 (*)    Hemoglobin 10.9 (*)    HCT 34.1 (*)    All other components within normal limits  COMPREHENSIVE METABOLIC PANEL WITH GFR  MAGNESIUM  URINALYSIS, ROUTINE W  REFLEX MICROSCOPIC  ETHANOL  RAPID URINE DRUG SCREEN, HOSP PERFORMED  10-HYDROXYCARBAZEPINE  CBG MONITORING, ED  POC URINE PREG, ED    EKG None  Radiology No results found.  Procedures Procedures  {Document cardiac monitor, telemetry assessment procedure when appropriate:1}  Medications Ordered in ED Medications  Tdap (BOOSTRIX) injection 0.5 mL (has  no administration in time range)    ED Course/ Medical Decision Making/ A&P   {   Click here for ABCD2, HEART and other calculatorsREFRESH Note before signing :1}                              Medical Decision Making Amount and/or Complexity of Data Reviewed Labs: ordered. Radiology: ordered.  Risk Prescription drug management.   ***  {Document critical care time when appropriate:1} {Document review of labs and clinical decision tools ie heart score, Chads2Vasc2 etc:1}  {Document your independent review of radiology images, and any outside records:1} {Document your discussion with family members, caretakers, and with consultants:1} {Document social determinants of health affecting pt's care:1} {Document your decision making why or why not admission, treatments were needed:1} Final Clinical Impression(s) / ED Diagnoses Final diagnoses:  None    Rx / DC Orders ED Discharge Orders     None

## 2023-10-27 NOTE — Telephone Encounter (Signed)
 Chief Complaint: Seizure on Saturday  Symptoms: bruise and bump on head, headache Frequency: Saturday Pertinent Negatives: Patient denies n/a Disposition: [x] ED /[] Urgent Care (no appt availability in office) / [] Appointment(In office/virtual)/ []  [place] [facility]/ [] Home Care/ [] Refused Recommended Disposition /[] [place] [facility]/ []  Follow-up with PCP Additional Notes: Patient called stating she had a seizure on Saturday night. Patient states she was having a bowel movement in the bathroom and the next thing she remembers is being on the floor, stating she had a seizure but was unsure how long she was out of consciousness. Patient lives alone and called her son immediately and EMS came out for evaluation. Patient declined EMS transport to hospital, stating "since it was Saturday I assumed it would be too busy at the hospital". However; patient is still having a headache since the seizure and has a big bump on her head. Patient states she is also experiencing weakness in legs when trying to get out of bed. This RN advised patient she needs to be evaluated at the ED asap and asked if her son would be able to take her. Patient states she will call her son to take her to ED now. This RN advised she call 911 if her son cannot take her and symptoms persist. Patient verbalized understanding.    Copied from CRM [id number]. Topic: Clinical - Red Word Triage >> Oct 27, 2023 10:54 AM Fuller Mandril wrote: Red Word that prompted transfer to Nurse Triage: Seizure fell forward know and bruise on forehead. Headache 9/10. EMS came but she declined hospital Reason for Disposition  Severe headache  (Note: Most people have a headache after a seizure.)  Answer Assessment - Initial Assessment Questions 1. ONSET: "When did the seizure occur?"     Saturday night 2. DURATION: "How long did the seizure last (or how long has it been happening)?" (e.g., seconds, minutes)  Note: Most seizures last less than 5  minutes.     Patient is unsure but states she used bathroom and lost consciousness  3. DESCRIPTION: "Describe what happened during the seizure." "Did the body become stiff?" "Was there any jerking?"  "Did they lose consciousness during the seizure?"     Patient was having bowel movement and then woke up on the floor of the bathroom and unsure how long patient was out 4. CIRCUMSTANCE: "What was the person doing when the seizure began?"      Having bowel movement 5. MENTAL STATUS AFTER SEIZURE: "Does the person seem more groggy or sleepy?" "Does the person know who they are, who you are, and where they are now?"      Patient is alert and oriented x 4 6. PRIOR SEIZURES: "Has the person had a seizure (convulsion) before?" (e.g., epilepsy, other cause)  If Yes, ask: "When was the last time?" and "What happened last time?"      Yes patient states she has a few since her surgery on brain  7. EPILEPSY: "Does the person have epilepsy?" Note: Check for medical ID bracelet.     Yes 8. MEDICINES: "Does the person take anticonvulsant medications?" (e.g., Yes, No; missed doses, any recent changes)     Patient states she takes a whole lot of medication  9. INJURY: "Was the person hurt or injured during the seizure?" (e.g., hit their head, bit their tongue)     Patient has knot on head and bruise on forehead 10. OTHER SYMPTOMS: "Are there any other symptoms?" (e.g., fever, headache)  Headache, weakness in legs  Protocols used: [facility]

## 2023-10-27 NOTE — Discharge Instructions (Signed)
 You were seen for your seizure in the emergency department.   At home, please continue to take the seizure medication that you have been prescribed.  Refrain from activities that could be dangerous when you have a seizure such as climbing a ladder or swimming.  Do not drive for at least 6 months.  Talk to your neurologist to see when it is safe to resume these activities.    Check your MyChart online for the results of any tests that had not resulted by the time you left the emergency department.   Follow-up with your primary doctor in 2-3 days regarding your visit.  Follow-up with your neurologist in 1 week.  Return immediately to the emergency department if you experience any of the following: Seizures lasting more than 5 minutes, or any other concerning symptoms.    Thank you for visiting our Emergency Department. It was a pleasure taking care of you today.

## 2023-10-27 NOTE — ED Triage Notes (Signed)
 Pt arrived via POV c/o seizure at home today. Pt reports she was in the bathroom, then remembers waking up on the floor. Pt reports her head hurts, from possible hitting her head.

## 2023-10-27 NOTE — ED Notes (Signed)
Pt states unable to give a urine sample at this time 

## 2023-10-27 NOTE — Telephone Encounter (Signed)
 Noted.

## 2023-10-28 ENCOUNTER — Telehealth: Payer: Self-pay | Admitting: Neurology

## 2023-10-28 NOTE — Telephone Encounter (Signed)
 Patient stated she was just released from hospital from having seizure Saturday night. Patient stated she had seizure while in bathroom and hit her head on the wall, has concussion.

## 2023-10-30 ENCOUNTER — Encounter: Payer: Self-pay | Admitting: Family Medicine

## 2023-10-30 ENCOUNTER — Ambulatory Visit: Admitting: Family Medicine

## 2023-10-30 VITALS — BP 127/85 | HR 87 | Resp 18 | Ht 62.0 in | Wt 199.1 lb

## 2023-10-30 DIAGNOSIS — G40019 Localization-related (focal) (partial) idiopathic epilepsy and epileptic syndromes with seizures of localized onset, intractable, without status epilepticus: Secondary | ICD-10-CM

## 2023-10-30 DIAGNOSIS — Z09 Encounter for follow-up examination after completed treatment for conditions other than malignant neoplasm: Secondary | ICD-10-CM | POA: Diagnosis not present

## 2023-10-30 DIAGNOSIS — G43E19 Chronic migraine with aura, intractable, without status migrainosus: Secondary | ICD-10-CM | POA: Diagnosis not present

## 2023-10-30 DIAGNOSIS — S060X9D Concussion with loss of consciousness of unspecified duration, subsequent encounter: Secondary | ICD-10-CM

## 2023-10-30 DIAGNOSIS — E871 Hypo-osmolality and hyponatremia: Secondary | ICD-10-CM | POA: Diagnosis not present

## 2023-10-30 LAB — 10-HYDROXYCARBAZEPINE: Triliptal/MTB(Oxcarbazepin): 26 ug/mL (ref 10–35)

## 2023-10-30 NOTE — Patient Instructions (Signed)
 F/U as before, call if you need me sooner  Lab today  Need to make an appointment with [facility] epilepsy clinic, you need  an a[appointment there  Thanks for choosing [facility], we consider it a privelige to serve you.

## 2023-10-30 NOTE — Assessment & Plan Note (Signed)
 Updated lab needed at/ before next visit.

## 2023-10-31 ENCOUNTER — Encounter: Payer: Self-pay | Admitting: Family Medicine

## 2023-10-31 ENCOUNTER — Other Ambulatory Visit: Payer: Self-pay | Admitting: Internal Medicine

## 2023-10-31 DIAGNOSIS — S060XAA Concussion with loss of consciousness status unknown, initial encounter: Secondary | ICD-10-CM | POA: Insufficient documentation

## 2023-10-31 DIAGNOSIS — G40019 Localization-related (focal) (partial) idiopathic epilepsy and epileptic syndromes with seizures of localized onset, intractable, without status epilepticus: Secondary | ICD-10-CM

## 2023-10-31 DIAGNOSIS — G43909 Migraine, unspecified, not intractable, without status migrainosus: Secondary | ICD-10-CM | POA: Insufficient documentation

## 2023-10-31 DIAGNOSIS — Z09 Encounter for follow-up examination after completed treatment for conditions other than malignant neoplasm: Secondary | ICD-10-CM | POA: Insufficient documentation

## 2023-10-31 DIAGNOSIS — R7989 Other specified abnormal findings of blood chemistry: Secondary | ICD-10-CM

## 2023-10-31 LAB — BASIC METABOLIC PANEL WITH GFR
BUN/Creatinine Ratio: <3 — ABNORMAL LOW (ref 9–23)
BUN: <2 mg/dL — CL (ref 6–24)
CO2: 17 mmol/L — ABNORMAL LOW (ref 20–29)
Calcium: 9.3 mg/dL (ref 8.7–10.2)
Chloride: 99 mmol/L (ref 96–106)
Creatinine, Ser: 0.78 mg/dL (ref 0.57–1.00)
Glucose: 89 mg/dL (ref 70–99)
Potassium: 4 mmol/L (ref 3.5–5.2)
Sodium: 133 mmol/L — ABNORMAL LOW (ref 134–144)
eGFR: 96 mL/min/{1.73_m2} (ref >59)

## 2023-10-31 NOTE — Assessment & Plan Note (Signed)
 Reports recurrent seizures , needs to f/u with epilepsy clinic at [facility], contact info given at visit

## 2023-10-31 NOTE — Progress Notes (Signed)
 [name]     MRN: [id number]      DOB: Oct 04, 1980  Chief Complaint  Patient presents with   Follow-up    Was seen in ED on 04/07 for seizure and concussion. Had seizure while having bowel movent and hit head. No seizures or other sx's since ED     HPI [name] is here for follow up of ED visit on 10/27/2023, when she presented following what she believes was  a seizure , recalls that while usng the bathroom and bending forward to wipe she was next on the floor an had a  in a direct blow to her forehead. ED evaluation showed no deep injury to skull or brain however had an abrasion on the forehead and a hematoma Reports that this is her 2nd seizure this year, the previous one was woitnessed by her daughter  and son in law. She is frustrated that she is still having seizures despite being compliant with meds, though directed to still has not been following up in epilepsy clinic at [facility] and phone contact info is provided at this visit for hert o do so, she had been calling neurosurgery clinic Still smokes marijuana and I encourage her to stop especially in light of her neurologic challenges, states it is the only thing that stops her headaches, but wuillling to hold off if her seizures will improve ROS Denies recent fever or chills. Denies sinus pressure, nasal congestion, ear pain or sore throat. Denies chest congestion, productive cough or wheezing. Denies chest pains, palpitations and leg swelling Denies abdominal pain, nausea, vomiting,diarrhea or constipation.   Denies dysuria, frequency, hesitancy or incontinence. Denies joint pain, swelling and limitation in mobility. Denies skin break down or rash.   PE  BP 127/85   Pulse 87   Resp 18   Ht 5\' 2"  (1.575 m)   Wt 199 lb 1.9 oz (90.3 kg)   SpO2 97%   BMI 36.42 kg/m   Patient alert and oriented and in no cardiopulmonary distress.  HEENT: No facial asymmetry, EOMI,     Neck supple .small hematoma on forehead, no skin  breakdown  Chest: Clear to auscultation bilaterally.  CVS: S1, S2 no murmurs, no S3.Regular rate.  ABD: Soft non tender.   Ext: No edema  MS: Adequate ROM spine, shoulders, hips and knees.  Skin: Intact, no ulcerations or rash noted.  Psych: Good eye contact, normal affect. Memory intact not anxious or depressed appearing.  CNS: CN 2-12 intact, power,  normal throughout.no focal deficits noted.   Assessment & Plan  Hyponatremia Updated lab needed .   Concussion By history and radiologic data , no witness present   Convulsions (HCC) Uncontrolled, 2 reported episodes in past 6 months, needs to establish care in epilepsy clinic at [facility]  Encounter for examination following treatment at hospital Patient in for follow up of recent ED visit Discharge summary, and laboratory and radiology data are reviewed, and any questions or concerns  are discussed. Specific issues requiring follow up are specifically addressed.   Localization-related (focal) (partial) idiopathic epilepsy and epileptic syndromes with seizures of localized onset, intractable, without status epilepticus (HCC) Reports recurrent seizures , needs to f/u with epilepsy clinic at [facility], contact info given at visit  Migraine headache Treated by Neurology , pt reporting that not controlled with current regime and states she uses marijuana for relief when she has a headache , willing to stop/ reduce  the marijuana use in the hope that her  seizures will decrease

## 2023-10-31 NOTE — Assessment & Plan Note (Signed)
 Uncontrolled, 2 reported episodes in past 6 months, needs to establish care in epilepsy clinic at [facility]

## 2023-10-31 NOTE — Assessment & Plan Note (Signed)
 By history and radiologic data , no witness present

## 2023-10-31 NOTE — Assessment & Plan Note (Signed)
 Treated by Neurology , pt reporting that not controlled with current regime and states she uses marijuana for relief when she has a headache , willing to stop/ reduce  the marijuana use in the hope that her seizures will decrease

## 2023-10-31 NOTE — Assessment & Plan Note (Signed)
Patient in for follow up of recent ED visit. Discharge summary, and laboratory and radiology data are reviewed, and any questions or concerns  are discussed. Specific issues requiring follow up are specifically addressed.  

## 2023-11-03 NOTE — Telephone Encounter (Signed)
 Copied from CRM [id number]. Topic: Clinical - Lab/Test Results >> Oct 31, 2023  3:48 PM Star East wrote: Reason for CRM: patient received message about labs- will go in the morning for redraw

## 2023-11-05 DIAGNOSIS — R7989 Other specified abnormal findings of blood chemistry: Secondary | ICD-10-CM | POA: Diagnosis not present

## 2023-11-05 MED ORDER — PREGABALIN 50 MG PO CAPS
ORAL_CAPSULE | ORAL | 3 refills | Status: DC
Start: 2023-11-05 — End: 2024-01-14

## 2023-11-05 MED ORDER — PREGABALIN 50 MG PO CAPS: ORAL_CAPSULE | ORAL | 0 refills | Status: DC

## 2023-11-05 NOTE — Telephone Encounter (Signed)
 Spoke to patient about recent seizure, she was in the bathroom, had a BM then all of a sudden fell and hit head, woke up on the floor, had bruises on her forehead. She called EMS. She is still having a lot of headaches. Discussed that I had conferred with Dr. Alease Amend at [facility], discussed starting low dose Lyrica 50mg  at bedtime. Side effects discussed. Also confirmed that she is getting the Aimovig without issues. F/u at [facility] and with me as scheduled

## 2023-11-06 ENCOUNTER — Telehealth: Payer: Self-pay

## 2023-11-06 ENCOUNTER — Ambulatory Visit: Payer: Self-pay

## 2023-11-06 ENCOUNTER — Encounter

## 2023-11-06 DIAGNOSIS — G4730 Sleep apnea, unspecified: Secondary | ICD-10-CM | POA: Diagnosis not present

## 2023-11-06 DIAGNOSIS — R0609 Other forms of dyspnea: Secondary | ICD-10-CM

## 2023-11-06 LAB — CMP14+EGFR
ALT: 13 IU/L (ref 0–32)
AST: 14 IU/L (ref 0–40)
Albumin: 3.8 g/dL — ABNORMAL LOW (ref 3.9–4.9)
Alkaline Phosphatase: 191 IU/L — ABNORMAL HIGH (ref 44–121)
BUN/Creatinine Ratio: 4 — ABNORMAL LOW (ref 9–23)
BUN: 3 mg/dL — ABNORMAL LOW (ref 6–24)
Bilirubin Total: <0.2 mg/dL (ref 0.0–1.2)
CO2: 18 mmol/L — ABNORMAL LOW (ref 20–29)
Calcium: 9 mg/dL (ref 8.7–10.2)
Chloride: 101 mmol/L (ref 96–106)
Creatinine, Ser: 0.75 mg/dL (ref 0.57–1.00)
Globulin, Total: 3 g/dL (ref 1.5–4.5)
Glucose: 89 mg/dL (ref 70–99)
Potassium: 4.5 mmol/L (ref 3.5–5.2)
Sodium: 131 mmol/L — ABNORMAL LOW (ref 134–144)
Total Protein: 6.8 g/dL (ref 6.0–8.5)
eGFR: 101 mL/min/{1.73_m2} (ref >59)

## 2023-11-06 LAB — CK: Total CK: 37 U/L (ref 32–182)

## 2023-11-06 NOTE — Telephone Encounter (Signed)
  Chief Complaint: medication question Symptoms: bilateral hand shakiness Frequency: intermittent "x awhile" Pertinent Negatives: Patient denies chest pain, difficulty breathing. Disposition: [] ED /[] Urgent Care (no appt availability in office) / [] Appointment(In office/virtual)/ []  [facility]/ [] Home Care/ [] Refused Recommended Disposition /[] [facility]/ [x]  Follow-up with PCP Additional Notes: Patient states she has had shaky hands for a long time. Patient unable to specify how long she has had the hand shakiness. She saw a commercial for a medication she states is was Austell XR, unsure of the spelling. She is wondering if she can have her medications reevaluated and maybe get started on this medication instead. She feels like she is on too much medication, states "I've got 8 pill bottles by my bed". She states her neurologist just added another medication for her to take at night.   Copied from CRM [id number]. Topic: Clinical - Red Word Triage >> Nov 06, 2023 12:44 PM Alysia Jumbo S wrote: [facility] that prompted transfer to Nurse Triage: reaction to medication "Austell xr" causing shaking in hands Reason for Disposition  Caller wants to use a complementary or alternative medicine  Answer Assessment - Initial Assessment Questions 1. NAME of MEDICINE: "What medicine(s) are you calling about?"     Austell XR* (unsure of spelling).  2. QUESTION: "What is your question?" (e.g., double dose of medicine, side effect)     She would like to have some of her medications taken off is possible, she feels she is on too much medication.  3. PRESCRIBER: "Who prescribed the medicine?" Reason: if prescribed by specialist, call should be referred to that group.     Medication is not prescribed.  4. SYMPTOMS: "Do you have any symptoms?" If Yes, ask: "What symptoms are you having?"  "How bad are the symptoms (e.g., mild, moderate, severe)     Bilateral hand shakiness, intermittent x  "a  while". She is unsure if it is a side effect.   5. PREGNANCY:  "Is there any chance that you are pregnant?" "When was your last menstrual period?"     N/A.  Protocols used: Medication Question Call-A-AH

## 2023-11-06 NOTE — Telephone Encounter (Signed)
 Copied from CRM [id number]. Topic: Clinical - Lab/Test Results >> Nov 06, 2023  8:53 AM Ivette P wrote: Reason for CRM: PT called due to a missed call, call was about labs.    Read As follows:  " Meldon Sport, MD 11/06/2023  7:54 AM EDT   Please advise her that her albumin (protein) level is still low. Please advise her to take protein supplement regularly. Her sodium level is borderline low, but overall stable compared to prior. She needs to maintain at least 64 ounces of fluid intake in a day."   Pt understood, no further questions

## 2023-11-07 ENCOUNTER — Telehealth: Payer: Self-pay

## 2023-11-07 NOTE — Telephone Encounter (Signed)
 See other message

## 2023-11-07 NOTE — Telephone Encounter (Signed)
 Copied from CRM [id number]. Topic: Clinical - Red Word Triage >> Nov 06, 2023 12:44 PM Alysia Jumbo S wrote: [facility] that prompted transfer to Nurse Triage: reaction to medication "Austell xr" causing shaking in hands >> Nov 06, 2023  4:53 PM Leory Rands wrote: Patient is calling back to correct the name of the medication Austedo XR

## 2023-11-07 NOTE — Telephone Encounter (Signed)
 She was just seen in the office last week. I believe the medication she is referencing is Oxtellar XR  which is a seizure medication. We do not handle her seizure medication. She will need to call her neurologist office to discuss switching to another seizure medication.

## 2023-11-11 ENCOUNTER — Ambulatory Visit (HOSPITAL_COMMUNITY): Admission: RE | Admit: 2023-11-11 | Source: Ambulatory Visit

## 2023-11-13 ENCOUNTER — Telehealth: Payer: Self-pay | Admitting: Neurology

## 2023-11-13 NOTE — Telephone Encounter (Signed)
 Left message with after hour service on 11-13-23 at 12:48 pm  Caller states she received a call and they asked if her Depo has anything to do with her getting brain surgery

## 2023-11-13 NOTE — Telephone Encounter (Signed)
 Called patient and advised her to contact the physician that prescribes her the Depo as they will be able to help her understanding about the medication. Patient stated she saw a commercial about Depo and wanted to ask Dr. Ty Gales about the medicine. Again I advised patient to please contact the [prescribing physician. Patient verbalized understanding and had no further questions or concerns.

## 2023-11-18 ENCOUNTER — Ambulatory Visit: Payer: 59

## 2023-11-25 ENCOUNTER — Telehealth (HOSPITAL_BASED_OUTPATIENT_CLINIC_OR_DEPARTMENT_OTHER): Payer: Self-pay

## 2023-11-25 ENCOUNTER — Ambulatory Visit: Payer: 59 | Admitting: Neurology

## 2023-11-25 NOTE — Telephone Encounter (Signed)
 Can we check on results?  Copied from CRM [id number]. Topic: Clinical - Lab/Test Results >> Nov 24, 2023  4:52 PM Alverda Joe S wrote: Reason for CRM: patient is calling to check in on home sleep study, I dont see any results.

## 2023-11-26 NOTE — Telephone Encounter (Signed)
 Patient needs to sign the signature. It was sent three times to the patient through text for an e-signature.The patient has finished the sleep test and once the patient has signed then it will be analyzed. Nothing to do on our end besides wait for the patient to sign. Bridgette Campus with [employer] will call the patient to let them know that they would need to sign.

## 2023-11-26 NOTE — Telephone Encounter (Signed)
 [name] has attempted to call the patient but there has been no response.She left a voicemail for the patient. [employer] sent another text for the patient the text to e-sign but the patient has not responded to the text. I called the patient to let her know about the signature and left a voicemail.

## 2023-11-26 NOTE — Telephone Encounter (Signed)
 Calling Bridgette Campus to get more information on what's missing.

## 2023-11-27 NOTE — Telephone Encounter (Signed)
 The Sleep Study has been finalized and is ready to be signed.

## 2023-11-28 ENCOUNTER — Ambulatory Visit: Payer: Self-pay

## 2023-11-28 NOTE — Telephone Encounter (Signed)
 Where do I look for sleep study, if I am to read it? [facility] or where? -CDY

## 2023-11-28 NOTE — Telephone Encounter (Signed)
 Copied from CRM [id number]. Topic: Clinical - Medical Advice >> Nov 28, 2023 11:18 AM Rosaria Common wrote: Reason for CRM: Right foot is in severe pain since 5/8.   Chief Complaint: Foot pain Symptoms: Right foot pain Frequency: Constant  Pertinent Negatives: Patient denies any other symptoms at this time  Disposition: [] ED /[x] Urgent Care (no appt availability in office) / [] Appointment(In office/virtual)/ []  [facility]/ [] Home Care/ [] Refused Recommended Disposition /[] [facility]/ []  Follow-up with PCP Additional Notes: Patient reports she began to experience right foot pain after taking out trash yesterday. She denies any known injury or cause for her pain. She states her pain is 10/10. Patient advised there are no appointments in the office until next week and that she should go to urgent care for evaluation. Patient verbalized understanding and agreement of this plan.     Reason for Disposition  [1] SEVERE pain (e.g., excruciating, unable to do any normal activities) AND [2] not improved after 2 hours of pain medicine  Answer Assessment - Initial Assessment Questions 1. ONSET: "When did the pain start?"      Yesterday  2. LOCATION: "Where is the pain located?"      Right foot  3. PAIN: "How bad is the pain?"    (Scale 1-10; or mild, moderate, severe)  - MILD (1-3): doesn't interfere with normal activities.   - MODERATE (4-7): interferes with normal activities (e.g., work or school) or awakens from sleep, limping.   - SEVERE (8-10): excruciating pain, unable to do any normal activities, unable to walk.      10/10 4. WORK OR EXERCISE: "Has there been any recent work or exercise that involved this part of the body?"      No 5. CAUSE: "What do you think is causing the foot pain?"     Unsure, happened after walking  6. OTHER SYMPTOMS: "Do you have any other symptoms?" (e.g., leg pain, rash, fever, numbness)     No other symptoms  Protocols used: Foot Pain-A-AH

## 2023-11-28 NOTE — Telephone Encounter (Signed)
Noted, patient advised to go to urgent care

## 2023-12-01 NOTE — Telephone Encounter (Signed)
 Can you help Dr. Linder Revere find the sleep study please?  Thank you!

## 2023-12-02 DIAGNOSIS — Z0389 Encounter for observation for other suspected diseases and conditions ruled out: Secondary | ICD-10-CM | POA: Diagnosis not present

## 2023-12-02 DIAGNOSIS — R069 Unspecified abnormalities of breathing: Secondary | ICD-10-CM | POA: Diagnosis not present

## 2023-12-02 NOTE — Telephone Encounter (Signed)
 Per Dr. Linder Revere, he has reviewed this sleep study as of yesterday, 12/01/2023.  Dr. Linder Revere will forward to triage box once signed for patient call back.

## 2023-12-02 NOTE — Telephone Encounter (Signed)
 Alvah Jewett to Faustina Hood, MD  Me RB   12/02/23  9:21 AM Emailed you the hst  Nfn  Dr. Linder Revere, I printed a copy of the email Raven emailed to me.  I will put it on your desk in C POD.  Thank you.  Fausto Sampedro

## 2023-12-03 ENCOUNTER — Telehealth: Payer: Self-pay | Admitting: Internal Medicine

## 2023-12-03 NOTE — Telephone Encounter (Signed)
 Dr. Linder Revere has signed off on HST through [facility] website.  Copy shows Digitally signed on 12/02/2023.  Please scan into EPIC so Dr. Linder Revere can respond to patients call regarding  wanting HST results. Thank you

## 2023-12-03 NOTE — Telephone Encounter (Signed)
 Spoke with patient regarding th 3:00 pm PFT appointment on Monday 12/08/23 at [facility] time is 2:45 pm--1st floor registration desk for check in----will mail information/ instructions to patient and she voiced her understanding

## 2023-12-03 NOTE — Telephone Encounter (Signed)
 [facility] HST was scanned into chart yesterday.

## 2023-12-04 ENCOUNTER — Telehealth (HOSPITAL_BASED_OUTPATIENT_CLINIC_OR_DEPARTMENT_OTHER): Payer: Self-pay

## 2023-12-04 NOTE — Telephone Encounter (Signed)
 Copied from CRM [id number]. Topic: Clinical - Lab/Test Results >> Dec 04, 2023 11:38 AM Eveleen Hinds B wrote: Reason for CRM: Please call patient. Patient stated she also called on yesterday and was told she would receive a call back and no one has called. Patient would like to discuss sleep study results.

## 2023-12-05 ENCOUNTER — Telehealth: Payer: Self-pay | Admitting: Internal Medicine

## 2023-12-05 NOTE — Telephone Encounter (Signed)
Spoke with patient regarding results and recommendations. Patient voiced understanding. No further questions or concerns.

## 2023-12-05 NOTE — Telephone Encounter (Signed)
 Sleep study showed only a few apneas, within normal limits. The study was normal.

## 2023-12-05 NOTE — Telephone Encounter (Signed)
 Pt calling again for sleep study results. Per last encounter keeps being told we will call her back and gets no call. Please call to advise @ [phone]

## 2023-12-05 NOTE — Telephone Encounter (Signed)
 [name], Elizabeth M21 minutes ago (10:41 AM)   TO Pt calling again for sleep study results. Per last encounter keeps being told we will call her back and gets no call. Please call to advise @ [phone]

## 2023-12-05 NOTE — Telephone Encounter (Signed)
 Please see telephone encounter dated 11/25/23 for remainder of conversation.

## 2023-12-05 NOTE — Telephone Encounter (Signed)
 [name], Tannessa M21 minutes ago (10:41 AM)   TO Pt calling again for sleep study results. Per last encounter keeps being told we will call her back and gets no call. Please call to advise @ [phone]

## 2023-12-05 NOTE — Telephone Encounter (Signed)
 Please see telephone encounter dated 11/25/23 for additional information.

## 2023-12-05 NOTE — Telephone Encounter (Signed)
 Per chart patient has called multiple times for HST results. Thanks!

## 2023-12-08 ENCOUNTER — Ambulatory Visit (HOSPITAL_COMMUNITY)
Admission: RE | Admit: 2023-12-08 | Discharge: 2023-12-08 | Disposition: A | Discharge status: Home / Self Care | Source: Ambulatory Visit | Attending: Internal Medicine | Admitting: Internal Medicine

## 2023-12-08 ENCOUNTER — Telehealth: Payer: Self-pay | Admitting: Neurology

## 2023-12-08 NOTE — Telephone Encounter (Signed)
 Pt. would like Dr. to call apt as she is trying to ,make sure that she can have help as she has seizures Complex phone number [phone]

## 2023-12-08 NOTE — Telephone Encounter (Signed)
 Pt called in stating her apartment complex is trying to make her move to a 1 bedroom apartment since she doesn't have anyone else living with her. She doesn't want to move because if she has 2 seizures back to back then her brother may need to come stay with her so she needs the extra bedroom. She says the apartment complex listened to Dr. Ty Gales before when she needed to move to a 1st floor apartment.

## 2023-12-08 NOTE — Telephone Encounter (Signed)
 We are not going to call her apartment complex. We can write another letter, pls confirm that the 2-bedroom apartment is on the 1st floor, we can write request for 1st floor/2 bedroom. Thanks

## 2023-12-08 NOTE — Telephone Encounter (Signed)
 Called patient and informed her that we are not going to call her apartment complex. We can write another letter. Patient confirmed that she is on the first floor in a 2 bedroom apartment. Patient would like to have a letter written.

## 2023-12-11 ENCOUNTER — Telehealth: Payer: Self-pay | Admitting: Family Medicine

## 2023-12-11 NOTE — Telephone Encounter (Signed)
 Copied from CRM [id number]. Topic: Clinical - Medical Advice >> Dec 11, 2023  3:36 PM Lizabeth Riggs wrote: Reason for CRM:  She needs a  letter stating she needs a 2 bedroom apartment because she has someone staying overnight with her due to having seizures. She needs this as soon as possible or they will make her move to a 1 bedroom.  Please call Vickie with [employer]; [employer] is her case Production designer, theatre/television/film, at [phone].

## 2023-12-12 ENCOUNTER — Other Ambulatory Visit: Payer: Self-pay

## 2023-12-12 MED ORDER — TIZANIDINE HCL 4 MG PO TABS: 4.0000 mg | ORAL_TABLET | Freq: Four times a day (QID) | ORAL | 3 refills | Status: DC | PRN

## 2023-12-12 NOTE — Telephone Encounter (Signed)
 [facility] called in asking for refill of tizanidine ,

## 2023-12-16 ENCOUNTER — Telehealth: Payer: Self-pay | Admitting: Neurology

## 2023-12-16 ENCOUNTER — Encounter: Payer: Self-pay | Admitting: Neurology

## 2023-12-16 NOTE — Telephone Encounter (Signed)
 Pt needs letter faxed to [facility] - Fax [phone]

## 2023-12-16 NOTE — Telephone Encounter (Signed)
 Patient was calling to check the status of the letter for the 2 bedroom appt  please call   Also patient needs to speak to someone about the Lyrica  patient has a blister by her mouth. She wonders if that is coming from the medication

## 2023-12-16 NOTE — Telephone Encounter (Signed)
 Letter done. If it is just one blister, most likely not the cause. Pls see PCP as well, thanks

## 2023-12-16 NOTE — Telephone Encounter (Signed)
 Pt called the office back she was informed that Letter is done. If it is just one blister, most likely lyrica  is not the cause. Dr Ty Gales said to Pls see PCP as well. Pt is going to call back with fax number to apartment complex and she would like a copy mailed to her,

## 2023-12-16 NOTE — Telephone Encounter (Signed)
 See other phone note

## 2023-12-16 NOTE — Telephone Encounter (Signed)
 Pt called no answer left a voice mail to call the office back

## 2023-12-17 ENCOUNTER — Telehealth: Payer: Self-pay | Admitting: Neurology

## 2023-12-17 NOTE — Telephone Encounter (Signed)
 Faxed to [facility] team.

## 2023-12-17 NOTE — Telephone Encounter (Signed)
 Pt called informed that in her visit in February Dr Peyton Brash notes say oxcarbazepine  Take 1 tablet two times daily. Pt verbalized understanding,

## 2023-12-17 NOTE — Telephone Encounter (Signed)
 Letter has been faxed and copy mailed to pt

## 2023-12-17 NOTE — Telephone Encounter (Signed)
 Placed in provider box to be signed.

## 2023-12-17 NOTE — Telephone Encounter (Signed)
 Pt called to verify her dosage of oxcarbazepine . She said she was not sure if it was 1 and 1/2 or just 1.

## 2023-12-18 ENCOUNTER — Ambulatory Visit (INDEPENDENT_AMBULATORY_CARE_PROVIDER_SITE_OTHER): Admitting: Family Medicine

## 2023-12-18 ENCOUNTER — Encounter: Payer: Self-pay | Admitting: Family Medicine

## 2023-12-18 ENCOUNTER — Telehealth: Payer: Self-pay | Admitting: Neurology

## 2023-12-18 VITALS — BP 119/77 | HR 81 | Ht 62.0 in | Wt 193.2 lb

## 2023-12-18 DIAGNOSIS — K1379 Other lesions of oral mucosa: Secondary | ICD-10-CM | POA: Diagnosis not present

## 2023-12-18 DIAGNOSIS — B009 Herpesviral infection, unspecified: Secondary | ICD-10-CM | POA: Diagnosis not present

## 2023-12-18 MED ORDER — ACYCLOVIR 400 MG PO TABS: 400.0000 mg | ORAL_TABLET | Freq: Two times a day (BID) | ORAL | 6 refills | Status: AC

## 2023-12-18 NOTE — Assessment & Plan Note (Signed)
 Outbreak of mouth lesion  Started patient on  mouth acyclovir 400 mg twice daily

## 2023-12-18 NOTE — Telephone Encounter (Signed)
 [name] called from [facility]- she is calling about a letter for verification for a live in aid

## 2023-12-18 NOTE — Telephone Encounter (Signed)
 Pt called in wanting to make sure we knew what the apartment complex needed. I let her know Odilia Bennett had called and gave us  the information we needed. She was satisfied.

## 2023-12-18 NOTE — Telephone Encounter (Signed)
 Instead of saying she needs an aide until she is said they are saying that the letter should maybe say like last letter 24/7 care so she can keep her apartment?  [name] is faxing paperwork from the corporate office

## 2023-12-18 NOTE — Patient Instructions (Signed)

## 2023-12-18 NOTE — Progress Notes (Signed)
   Established Patient Office Visit   Subjective  Patient ID: [name], female    DOB: Sep 30, 1980  Age: 43 y.o. 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  HPI Patient presents to the clinic for mouth sores. For the details of today's visit, please refer to assessment and plan.   Review of Systems  Constitutional:  Negative for chills and fever.  Respiratory:  Negative for shortness of breath.   Cardiovascular:  Negative for chest pain.  Neurological:  Negative for dizziness and headaches.      Objective:     BP 119/77   Pulse 81   Ht 5\' 2"  (1.575 m)   Wt 193 lb 4 oz (87.7 kg)   SpO2 96%   BMI 35.35 kg/m  BP Readings from Last 3 Encounters:  12/18/23 119/77  10/30/23 127/85  10/27/23 (!) 138/99      Physical Exam Vitals reviewed.  Constitutional:      General: She is not in acute distress.    Appearance: Normal appearance. She is not ill-appearing, toxic-appearing or diaphoretic.  HENT:     Head: Normocephalic.     Mouth/Throat:     Comments:  Vesicula  lesions resembling herpes simplex virus (HSV) infection  mouth and around the lips Eyes:     General:        Right eye: No discharge.        Left eye: No discharge.     Conjunctiva/sclera: Conjunctivae normal.  Cardiovascular:     Rate and Rhythm: Normal rate.     Pulses: Normal pulses.     Heart sounds: Normal heart sounds.  Pulmonary:     Effort: Pulmonary effort is normal. No respiratory distress.     Breath sounds: Normal breath sounds.  Skin:    General: Skin is warm and dry.     Capillary Refill: Capillary refill takes less than 2 seconds.  Neurological:     Mental Status: She is alert.      No results found for any visits on 12/18/23.  The ASCVD Risk  score (Arnett DK, et al., 2019) failed to calculate for the following reasons:   Risk score cannot be calculated because patient has a medical history suggesting prior/existing ASCVD    Assessment & Plan:  Mouth sores  PCR DNA positive for HSV2 Assessment & Plan: Outbreak of mouth lesion  Started patient on  mouth acyclovir 400 mg twice daily   Other orders -     Acyclovir; Take 1 tablet (400 mg total) by mouth 2 (two) times daily.  Dispense: 120 tablet; Refill: 6    Return if symptoms worsen or fail to improve.   Avelino Lek Amber Bail, FNP

## 2023-12-19 NOTE — Telephone Encounter (Signed)
 Pt stated that she had someone fill out paperwork and that she is going to have 24/7 care at her apartment

## 2023-12-19 NOTE — Telephone Encounter (Signed)
 Pls let patient know that [name] called saying that letter state 24/7 care and that she needs a live-in aide. Is that what she will have? We want to be accurate in what we document. Thanks

## 2023-12-21 NOTE — Progress Notes (Deleted)
 [name], female    DOB: [birth year]    MRN: [id number]   Brief patient profile:  47  yobf  active smoker  referred to pulmonary clinic in [place]  09/29/2023 by doe x first covid years around 2020-22 but never had covid to her knowledge   Last uip 2003  140 -150  Admit date: 08/29/2023 Discharge date: 09/05/2023   Admitted From: Home Disposition: Home Home with home health   Recommendations for Outpatient Follow-up:  Follow up with PCP in 1-2 weeks Please obtain BMP/CBC in one week      Discharge summary: 43 year old with history of seizures, left temporal lobe encephalocele s/p resection, chronic hyponatremia, smoker, marijuana use, depression who presented to [facility] on 2/7 with shortness of breath, chest pain,  chills,  fatigue and dry cough for 3 days.  In the emergency room she was tachycardic and tachypneic with respiratory rate of 40.  Initial temperature 99.  Initially on nasal cannula oxygen .  COVID flu and RSV negative.  Mildly elevated troponins.  EKG nonischemic.  CTA chest with no PE, diffuse groundglass densities concerning for pneumonia.  WBC 14.  Procalcitonin 0.44.  Patient was started on Rocephin  and azithromycin , nasal cannula oxygen .  Cardiology consulted.  Echocardiogram with normal ejection fraction. Started on IV heparin  for non-STEMI.  Patient subsequently decompensated and placed on BiPAP.  She was still tachypneic so she was intubated and placed on vent and transferred to [facility].   2/7 admitted to [facility] with chest pain and SOB Intubated  2/8 transferred to ICU at  [facility].  Continuing antibiotics, IV diuresis, systemic steroids, following up on cultures remains on heparin . RVP positive for rhinovirus, ustrep neg, MRSA PCR pos. Sig bronchospasm. BDs and ICS optimized.  2/9 still having some vent synchrony difficulty. Increased prop ceiling to 75. Changed vent to PCV seems to be more comfortable. Metabolic acidosis improved. Still  w/ sig wheeze. Change steroids to 40mg  bid  2/10, extubated to nasal cannula oxygen . 2/11, transferred to medical floor. 2/14, all symptoms improved.  On room air.  Echocardiogram with normal ejection fraction, no evidence of regional wall motion abnormalities.  Stable to discharge home.   Acute hypoxemic respiratory failure secondary to rhinovirus pneumonia, superadded community-acquired pneumonia. Patient required mechanical ventilation and nasal canula oxygen  and now on room air.  Patient completed 5 days of Rocephin  and azithromycin .   Rhinovirus was positive. Patient was also on a steroid for bronchospasm.  Symptoms completely improved.  Will discontinue further steroids.   Non-STEMI: Troponin elevated and flat and trended down.  Received 48 hours of IV heparin .  Echocardiogram was grossly normal with some wall motion abnormality.  Repeat echocardiogram without any evidence of regional wall motion abnormalities. Discussed with cardiology.  They will schedule outpatient follow-up.  Currently recommended to continue aspirin  and Lipitor.  Will add metoprolol .   History of epilepsy: On Briviact , zonisamide  and Trileptal .  No new seizure. Chronic hyponatremia: Stable at around baseline. Smoker: Counseled to quit.  Nicotine  patch. Anxiety depression: On BuSpar  and nortriptyline .   Medically stable for discharge.   Discharge Diagnoses:  Principal Problem:   Acute hypoxemic respiratory failure (HCC)   Convulsions (HCC)   Seizure (HCC)   GAD (generalized anxiety disorder)   Morbid obesity (HCC)   Elevated troponin   Acute respiratory failure with hypoxia [facility])     History of Present Illness  09/29/2023  Pulmonary/ 1st office eval/ [name] / [place] [facility] at wt  204  Chief Complaint  Patient presents with   Consult    DOE   Dyspnea:  hasn't done any grocery shopping in several years due to sob  Cough: no cough  Sleep: flat bed with 2 pillows  SABA use: none  02: none   Rec     12/24/2023  f/u ov/[place] office/[name] re: *** maint on ***  No chief complaint on file.   Dyspnea:  *** Cough: *** Sleeping: ***   resp cc  SABA use: *** 02: ***  Lung cancer screening: ***   No obvious day to day or daytime variability or assoc excess/ purulent sputum or mucus plugs or hemoptysis or cp or chest tightness, subjective wheeze or overt sinus or hb symptoms.    Also denies any obvious fluctuation of symptoms with weather or environmental changes or other aggravating or alleviating factors except as outlined above   No unusual exposure hx or h/o childhood pna/ asthma or knowledge of premature birth.  Current Allergies, Complete Past Medical History, Past Surgical History, Family History, and Social History were reviewed in [employer] record.  ROS  The following are not active complaints unless bolded Hoarseness, sore throat, dysphagia, dental problems, itching, sneezing,  nasal congestion or discharge of excess mucus or purulent secretions, ear ache,   fever, chills, sweats, unintended wt loss or wt gain, classically pleuritic or exertional cp,  orthopnea pnd or arm/hand swelling  or leg swelling, presyncope, palpitations, abdominal pain, anorexia, nausea, vomiting, diarrhea  or change in bowel habits or change in bladder habits, change in stools or change in urine, dysuria, hematuria,  rash, arthralgias, visual complaints, headache, numbness, weakness or ataxia or problems with walking or coordination,  change in mood or  memory.        No outpatient medications have been marked as taking for the 12/24/23 encounter (Appointment) with [name], MD.             Past Medical History:  Diagnosis Date   Back pain    Chronic headaches    Depression    Fall 03/07/2021   Genital warts    herpes   Herpes    Lip injury 01/25/2021   Lower lip trauma on 01/19/2021 during seizure, treated at [facility] urgent care   Seizures [facility])     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 ([facility])   Vision loss, bilateral 05/08/2021      Objective:    Wt Readings from Last 3 Encounters:  12/18/23 193 lb 4 oz (87.7 kg)  10/30/23 199 lb 1.9 oz (90.3 kg)  10/27/23 203 lb 0.7 oz (92.1 kg)      Vital signs reviewed  12/24/2023  - Note at rest 02 sats  ***% on ***   General appearance:    ***                  Assessment

## 2023-12-22 ENCOUNTER — Encounter: Payer: Self-pay | Admitting: Neurology

## 2023-12-22 NOTE — Telephone Encounter (Signed)
 Done, thanks

## 2023-12-23 ENCOUNTER — Telehealth: Payer: Self-pay | Admitting: Neurology

## 2023-12-23 ENCOUNTER — Telehealth: Payer: Self-pay

## 2023-12-23 NOTE — Telephone Encounter (Signed)
 Copied from CRM [id number]. Topic: Clinical - Medication Question >> Dec 23, 2023  8:37 AM Georgeann Kindred wrote: Reason for CRM: Patient is requesting a callback from a nurse or provider. She states that she was seen by John Muzzy Polanco on 05/29 and was prescribed a new medication. She is wanting know how is it going to take for the medication to began healing her lip. Please contact patient at [phone].

## 2023-12-23 NOTE — Telephone Encounter (Signed)
 Updated letter has been faxed for pt to her apartment complex

## 2023-12-23 NOTE — Telephone Encounter (Signed)
 Pt.cld as Rental office Fax#[phone] did not rcv 12/23/23 new fax completed around 8:30am. If possible pleas refax to Odilia Bennett or [address] at Fax#[phone]: Rental office says " Pt. Is in 2 bed room, due to HUD Daughter and grandchild had to move and she is living over the allowed space for 1 person. So If she can not get a letter that says "Pt. Needs full time live in assistance" she will need to move to a 1 bed room.Elizabeth AasAaron AasIf these abnormal clinical findings persist, appropriate workup will be completed. The patient understands that follow up is required to elucidate the situation. We cannot state or agree that she needs live in assistance then it also needs to be written on the form provided from Rental Office. Please refax either outcome.

## 2023-12-23 NOTE — Telephone Encounter (Signed)
 Patient needs to take acyclovir400 mg twice daily and sores may take a few weeks to heal

## 2023-12-24 ENCOUNTER — Ambulatory Visit: Admitting: Internal Medicine

## 2023-12-24 ENCOUNTER — Telehealth: Payer: Self-pay | Admitting: Neurology

## 2023-12-24 NOTE — Telephone Encounter (Signed)
 Pt called in stating her apartment complex has not received the letter from Dr. Ty Gales. They just called her a few minutes ago to tell her they didn't have it. She would like it sent again.

## 2023-12-24 NOTE — Telephone Encounter (Signed)
 Paperwork refaxed to apartment complex

## 2023-12-24 NOTE — Telephone Encounter (Signed)
 Pt advised with verbal understanding

## 2023-12-24 NOTE — Telephone Encounter (Signed)
 Paper work has been refaxed.

## 2023-12-24 NOTE — Progress Notes (Deleted)
 Cardiology Office Note:  .   Date:  12/24/2023  ID:  Astrid Lay, DOB 1980-10-29, MRN [id number] PCP: Towanda Fret, MD  [facility] Providers Cardiologist:  Teddie Favre, MD  History of Present Illness: .   [name] is a 43 y.o. 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   Last seen in heartcare OV 09/18/2023 with Cadence Gennaro Khat, PA for hospital follow up in regards to worsening dyspnea, orthopnea, pleuritic chest pain, and troponin elevation, which was suspected due to supply/demand mismatch.  During that hospitalization 08/29/2023, patient was treated with BiPAP, intubated, diagnosed with rhinovirus/MRSA.  At follow-up OV, denied any chest pain, reported some SOB with walking. Ordered a cardiac CTA, which showed coronary calcium  score of 0, minimal plaque in the proximal RCA, and recommended preventative therapy and risk factor modification.  Also referred to pulmonary and ordered sleep study, which was normal.  Pulmonary ordered PFTs and CT chest, based on results would will determine if follow-up is needed.  Does not appear that PFT was completed.  No order for CT chest.  Continued on ASA 81 mg, Lipitor 40 mg and Lopressor  25 mg twice daily.   Today, reports ### and denies ###. Reports compliance with medications.  Dietary habitats:  Activity level:  Social: Denies tobacco use/Binging ETOH/drug use   Chest pain Hospitalization 08/29/2023 with elevated troponins, suspected 2/2 demand ischemia. 08/2023 ECHO: EF 60 to 65%, no RWMA 09/2023: Cardiac CTA, which showed coronary calcium  score of 0, minimal plaque in the proximal RCA, and recommended preventative therapy and  risk factor modification 08/2023: LDL 50; 10/2023: AST/ALT WNL Continue ASA 81 mg, Lipitor 40 mg, Lopressor  25 mg twice daily  DOE Hospitalization 08/29/2023: Acute respiratory failure 2/2 rhinovirus, PNA and CAP requiring intubation Negative ischemic workup as above Referred to pulmonary for sleep study, which was normal. Pulmonary ordered PFTs and CT chest, based on results would will determine if follow-up is needed.  Does not appear that PFT was completed.  No order for CT chest.  Recommended to follow up with pulmonary to complete testing.    Studies Reviewed: [name]    Coronary CTA 10/03/2023 IMPRESSION: No significant extracardiac findings within the visualized chest. IMPRESSION: 1. Coronary calcium  score of 0. This was 0 percentile for age-, sex,and race-matched controls. 2. Total plaque volume 11 mm3 which is 59 percentile for age- andsex-matched controls (calcified plaque 0 mm3; non-calcified plaque11 mm3). TPV is (mild). 3. Normal coronary origin with right dominance. 4. Minimal (0-24) plaque in the proximal RCA.  Limited echo 09/04/23 1. Left ventricular ejection fraction, by estimation, is 60 to 65%. The  left ventricle has normal function. The left ventricle has no regional  wall motion abnormalities.   2. Right ventricular systolic function is normal. The right ventricular  size is normal.   3. The mitral valve is grossly normal. No evidence of mitral valve  regurgitation. No evidence of mitral stenosis.   4. The aortic valve was not well visualized. Aortic valve regurgitation  is not visualized. No aortic stenosis is present.   5. The inferior vena cava is normal in size with greater than 50%  respiratory variability, suggesting right atrial pressure of 3 mmHg.  Echocardiogram 08/29/2023  1. Left ventricular ejection fraction, by estimation, is 65 to 70%. The  left ventricle has normal function. Left ventricular endocardial border  not optimally defined to evaluate regional  wall motion - cannot exclude  basal posterolateral hypokinesis.  There is mild asymmetric left ventricular hypertrophy of the septal  segment. Left ventricular diastolic parameters are consistent with Grade I  diastolic dysfunction (impaired relaxation).   2. Right ventricular systolic function is normal. The right ventricular  size is normal. Tricuspid regurgitation signal is inadequate for assessing  PA pressure.   3. The mitral valve is grossly normal. Trivial mitral valve  regurgitation.   4. The aortic valve is tricuspid. Aortic valve regurgitation is not  visualized. No aortic stenosis is present.   5. The inferior vena cava is normal in size with greater than 50%  respiratory variability, suggesting right atrial pressure of 3 mmHg.   Risk Assessment/Calculations:   {Does this patient have ATRIAL FIBRILLATION?[phone]} No BP recorded.  {Refresh Note OR Click here to enter BP  :1}***       Physical Exam:   VS:  There were no vitals taken for this visit.   Wt Readings from Last 3 Encounters:  12/18/23 193 lb 4 oz (87.7 kg)  10/30/23 199 lb 1.9 oz (90.3 kg)  10/27/23 203 lb 0.7 oz (92.1 kg)    GEN: Well nourished, well developed in no acute distress NECK: No JVD; No carotid bruits CARDIAC: ***RRR, no murmurs, rubs, gallops RESPIRATORY:  Clear to auscultation without rales, wheezing or rhonchi  ABDOMEN: Soft, non-tender, non-distended EXTREMITIES:  No edema; No deformity   ASSESSMENT AND PLAN: .   ***    {Are you ordering a CV Procedure (e.g. stress test, cath, DCCV, TEE, etc)?   Press F2        :[id number]}  Dispo: ***  Signed, Metta Actis, PA-C

## 2023-12-25 ENCOUNTER — Telehealth: Payer: Self-pay | Admitting: Neurology

## 2023-12-25 ENCOUNTER — Other Ambulatory Visit: Payer: Self-pay | Admitting: Family Medicine

## 2023-12-25 ENCOUNTER — Ambulatory Visit: Payer: 59 | Attending: Student | Admitting: Physician Assistant

## 2023-12-25 ENCOUNTER — Encounter: Payer: Self-pay | Admitting: Physician Assistant

## 2023-12-25 MED ORDER — ATORVASTATIN CALCIUM 40 MG PO TABS: 40.0000 mg | ORAL_TABLET | Freq: Every day | ORAL | 0 refills | Status: DC

## 2023-12-25 NOTE — Telephone Encounter (Signed)
 Pt called and is asking for another fax to be sent and letter to be mailed to her, office is still not getting fax or cover letter

## 2023-12-25 NOTE — Telephone Encounter (Signed)
 Copied from CRM [id number]. Topic: Clinical - Medication Refill >> Dec 25, 2023 11:26 AM Elle L wrote: Medication: atorvastatin  (LIPITOR) 40 MG tablet   The patient had this prescribed at the hospital.   This is the patient's preferred pharmacy:   [id number] - [place], [place] - [address]  [place] [address] Phone: [phone] Fax: [phone]  Is this the correct pharmacy for this prescription? Yes  Has the prescription been filled recently? Yes  Is the patient out of the medication? Yes  Has the patient been seen for an appointment in the last year OR does the patient have an upcoming appointment? Yes  Can we respond through MyChart? No  Agent: Please be advised that Rx refills may take up to 3 business days. We ask that you follow-up with your pharmacy.

## 2023-12-25 NOTE — Telephone Encounter (Signed)
 Left message with the after hours service on 12-25-23   Caller states that [name] just called about the papers she is sending her. She wants to know if she sending the papers in the mail as well

## 2023-12-25 NOTE — Telephone Encounter (Signed)
 Paperwork has been refaxed its been faxed everyday

## 2023-12-25 NOTE — Telephone Encounter (Signed)
Paperwork re-faxed twice

## 2023-12-25 NOTE — Telephone Encounter (Signed)
 Patient wants to see if we sent the form to the landlord today

## 2023-12-27 ENCOUNTER — Emergency Department (HOSPITAL_COMMUNITY)

## 2023-12-27 ENCOUNTER — Emergency Department (HOSPITAL_COMMUNITY)
Admission: EM | Admit: 2023-12-27 | Discharge: 2023-12-27 | Disposition: A | Discharge status: Home / Self Care | Attending: Emergency Medicine | Admitting: Emergency Medicine

## 2023-12-27 ENCOUNTER — Other Ambulatory Visit: Payer: Self-pay

## 2023-12-27 ENCOUNTER — Encounter (HOSPITAL_COMMUNITY): Payer: Self-pay

## 2023-12-27 DIAGNOSIS — S52531A Colles' fracture of right radius, initial encounter for closed fracture: Secondary | ICD-10-CM | POA: Insufficient documentation

## 2023-12-27 DIAGNOSIS — Z7982 Long term (current) use of aspirin: Secondary | ICD-10-CM | POA: Diagnosis not present

## 2023-12-27 DIAGNOSIS — S52501A Unspecified fracture of the lower end of right radius, initial encounter for closed fracture: Secondary | ICD-10-CM | POA: Diagnosis not present

## 2023-12-27 DIAGNOSIS — S52351A Displaced comminuted fracture of shaft of radius, right arm, initial encounter for closed fracture: Secondary | ICD-10-CM | POA: Diagnosis not present

## 2023-12-27 DIAGNOSIS — M25531 Pain in right wrist: Secondary | ICD-10-CM | POA: Diagnosis not present

## 2023-12-27 DIAGNOSIS — W182XXA Fall in (into) shower or empty bathtub, initial encounter: Secondary | ICD-10-CM | POA: Insufficient documentation

## 2023-12-27 DIAGNOSIS — S6991XA Unspecified injury of right wrist, hand and finger(s), initial encounter: Secondary | ICD-10-CM | POA: Diagnosis present

## 2023-12-27 MED ORDER — OXYCODONE-ACETAMINOPHEN 5-325 MG PO TABS
2.0000 | ORAL_TABLET | Freq: Once | ORAL | Status: AC
  Administered 2023-12-27: 2 via ORAL
  Filled 2023-12-27: qty 2

## 2023-12-27 MED ORDER — OXYCODONE-ACETAMINOPHEN 5-325 MG PO TABS: 1.0000 | ORAL_TABLET | Freq: Four times a day (QID) | ORAL | 0 refills | Status: DC | PRN

## 2023-12-27 NOTE — Discharge Instructions (Signed)
 Your x-ray shows that you have broken your wrist.  This is a fracture that can be taken care of by our local specialist Dr. Evalee Hila, I have included phone number here, please call the office on Monday morning and they will get you into the office this week for follow-up.  Please wear the splint and the sling at all times until you follow-up.  You should apply ice and take Tylenol  and ibuprofen  to help with the pain.  If you have severe pain that requires stronger medication you can take the Percocet 1 tablet every 6 hours as needed for severe pain.  If you are going to take the Percocet do not take Tylenol  with it.  If you she develop severe or worsening pain swelling or numbness or change in color of your fingers you need to return to the emergency department immediately

## 2023-12-27 NOTE — ED Provider Notes (Signed)
 [place] EMERGENCY DEPARTMENT AT [facility] Provider Note   CSN: [id number] Arrival date & time: 12/27/23  2030     History  Chief Complaint  Patient presents with   Wrist Pain    [name] is a 43 y.o. female.   Wrist Pain   This patient is a 43 year old female, she presents to the hospital today with a complaint of a right forearm injury.  She states that she was in the shower when she felt lightheaded and decided to get out of the shower, she started to fall and fell out of the bathtub shower onto the floor.  She tried to catch herself with her right arm and this caused pain with associated deformity and swelling of her right wrist.  She has no numbness but has difficulty making a fist or using a grip with that hand secondary to pain and swelling.  She is right-hand dominant, this occurred just prior to arrival.    Home Medications Prior to Admission medications   Medication Sig Start Date End Date Taking? Authorizing Provider  oxyCODONE -acetaminophen  (PERCOCET/ROXICET) 5-325 MG tablet Take 1 tablet by mouth every 6 (six) hours as needed for severe pain (pain score 7-10). 12/27/23  Yes Early Glisson, MD  acyclovir  (ZOVIRAX ) 400 MG tablet Take 1 tablet (400 mg total) by mouth 2 (two) times daily. 12/18/23   Del Abron Abt, FNP  aspirin  81 MG chewable tablet Chew 1 tablet (81 mg total) by mouth daily. 09/06/23   Vada Garibaldi, MD  atorvastatin  (LIPITOR) 40 MG tablet Take 1 tablet (40 mg total) by mouth daily. 12/25/23 03/24/24  Towanda Fret, MD  brivaracetam  (BRIVIACT ) 100 MG TABS tablet Take 1 tablet (100 mg total) by mouth 2 (two) times daily. 09/26/23   Jhonny Moss, MD  busPIRone  (BUSPAR ) 5 MG tablet TAKE 1 TABLET(5 MG) BY MOUTH TWICE DAILY 10/06/23   Towanda Fret, MD  Erenumab -aooe (AIMOVIG ) 140 MG/ML SOAJ Inject 140 mg into the skin every 30 (thirty) days. 10/08/23   Jhonny Moss, MD  ketoconazole  (NIZORAL ) 2 % cream Apply 1 Application  topically 2 (two) times daily as needed for irritation. 05/08/23   Towanda Fret, MD  medroxyPROGESTERone  (DEPO-PROVERA ) 150 MG/ML injection ADMINISTER 1 ML(150 MG) IN THE MUSCLE EVERY 3 MONTHS 08/26/23   Javan Messing, NP  meloxicam  (MOBIC ) 15 MG tablet Take 1 tablet (15 mg total) by mouth daily. 08/14/23   Towanda Fret, MD  metoprolol  tartrate (LOPRESSOR ) 100 MG tablet Take 1 tablet (100 mg total) by mouth as directed. Take 2 Hours prior to Cardiac CT. Do not take Lopressor  25 mg on this day. 09/18/23   Furth, Cadence H, PA-C  metoprolol  tartrate (LOPRESSOR ) 25 MG tablet Take 1 tablet (25 mg total) by mouth 2 (two) times daily. 09/05/23 10/05/23  Vada Garibaldi, MD  nortriptyline  (PAMELOR ) 25 MG capsule TAKE 2 CAPSULES BY MOUTH EVERY NIGHT 09/26/23   Jhonny Moss, MD  oxcarbazepine  (TRILEPTAL ) 600 MG tablet Take 1 tablet two times daily . 09/26/23   Jhonny Moss, MD  pregabalin  (LYRICA ) 50 MG capsule Take 1 capsule every night 11/05/23   Jhonny Moss, MD  terbinafine  (LAMISIL ) 250 MG tablet Take 1 tablet (250 mg total) by mouth daily. 01/13/23   Lillia Reilly, MD  tiZANidine  (ZANAFLEX ) 4 MG tablet Take 1 tablet (4 mg total) by mouth every 6 (six) hours as needed for muscle spasms. 12/12/23   Jhonny Moss, MD  triamcinolone  (KENALOG ) 0.025 % ointment Apply 1 Application topically 2 (two) times daily as needed (itching). 11/11/22   Lillia Reilly, MD  Ubrogepant  (UBRELVY ) 100 MG TABS Take 1 tablet as needed at onset of migraine. Do not take more than 3 a week 09/15/23   Jhonny Moss, MD  Vitamin D , Ergocalciferol , (DRISDOL ) 1.25 MG (50000 UNIT) CAPS capsule Take 1 capsule (50,000 Units total) by mouth every 7 (seven) days. 08/14/23   Towanda Fret, MD  zonisamide  (ZONEGRAN ) 100 MG capsule TAKE ONE (1) CAPSULE BY MOUTH ONCE DAILY IN THE MORNING AND TAKE FIVE (5) CAPSULES IN THE EVENING 10/14/23   Jhonny Moss, MD      Allergies    Morphine    Review of Systems   Review  of Systems  All other systems reviewed and are negative.   Physical Exam Updated Vital Signs BP 119/89 (BP Location: Left Arm)   Pulse 91   Temp 98.4 F (36.9 C) (Oral)   Resp 17   Ht 1.575 m (5\' 2" )   Wt 88 kg   SpO2 99%   BMI 35.48 kg/m  Physical Exam Vitals and nursing note reviewed.  Constitutional:      General: She is not in acute distress.    Appearance: She is well-developed.  HENT:     Head: Normocephalic and atraumatic.     Mouth/Throat:     Pharynx: No oropharyngeal exudate.  Eyes:     General: No scleral icterus.       Right eye: No discharge.        Left eye: No discharge.     Conjunctiva/sclera: Conjunctivae normal.     Pupils: Pupils are equal, round, and reactive to light.  Neck:     Thyroid : No thyromegaly.     Vascular: No JVD.  Cardiovascular:     Rate and Rhythm: Normal rate and regular rhythm.     Heart sounds: Normal heart sounds. No murmur heard.    No friction rub. No gallop.  Pulmonary:     Effort: Pulmonary effort is normal. No respiratory distress.     Breath sounds: Normal breath sounds. No wheezing or rales.  Abdominal:     General: Bowel sounds are normal. There is no distension.     Palpations: Abdomen is soft. There is no mass.     Tenderness: There is no abdominal tenderness.  Musculoskeletal:        General: Swelling, tenderness and deformity present.     Cervical back: Normal range of motion and neck supple.     Comments: There is pain swelling and deformity to the right distal forearm at the wrist, hand appears normal with no tenderness over the hand, normal flexion extension of the fingers  Lymphadenopathy:     Cervical: No cervical adenopathy.  Skin:    General: Skin is warm and dry.     Findings: No erythema or rash.  Neurological:     Mental Status: She is alert.     Coordination: Coordination normal.     Comments: Normal sensation diffusely to the right hand, normal pulses at the right wrist but there is swelling of the  right wrist  Psychiatric:        Behavior: Behavior normal.     ED Results / Procedures / Treatments   Labs (all labs ordered are listed, but only abnormal results are displayed) Labs Reviewed - No data to display  EKG None  Radiology DG Wrist Complete  Right Result Date: 12/27/2023 CLINICAL DATA:  Fall, wrist pain EXAM: RIGHT WRIST - COMPLETE 3+ VIEW COMPARISON:  None Available. FINDINGS: There is a comminuted fracture through the distal right radius. Mild posterior angulation and displacement. No ulnar abnormality. No subluxation or dislocation. IMPRESSION: Mildly comminuted and posteriorly displaced/angulated distal right radial fracture. Electronically Signed   By: Janeece Mechanic M.D.   On: 12/27/2023 21:21    Procedures .Splint Application  Date/Time: 12/27/2023 9:29 PM  Performed by: Early Glisson, MD Authorized by: Early Glisson, MD   Consent:    Consent obtained:  Verbal   Consent given by:  Patient   Risks discussed:  Discoloration, numbness, pain and swelling   Alternatives discussed:  No treatment Universal protocol:    Procedure explained and questions answered to patient or proxy's satisfaction: yes     Immediately prior to procedure a time out was called: yes     Patient identity confirmed:  Verbally with patient Pre-procedure details:    Distal neurologic exam:  Normal   Distal perfusion: distal pulses strong and brisk capillary refill   Procedure details:    Location:  Arm   Arm location:  R lower arm   Splint type:  Sugar tong   Supplies:  Elastic bandage, cotton padding and fiberglass   Attestation: Splint applied and adjusted personally by me   Post-procedure details:    Distal neurologic exam:  Normal   Distal perfusion: distal pulses strong and brisk capillary refill     Procedure completion:  Tolerated well, no immediate complications   Post-procedure imaging: not applicable   Comments:           Medications Ordered in ED Medications   oxyCODONE -acetaminophen  (PERCOCET/ROXICET) 5-325 MG per tablet 2 tablet (2 tablets Oral Given 12/27/23 2125)    ED Course/ Medical Decision Making/ A&P                                 Medical Decision Making Amount and/or Complexity of Data Reviewed Radiology: ordered.  Risk Prescription drug management.    This patient presents to the ED for concern of wrist injury differential diagnosis includes fracture, dislocation, contusion, sprain     Lab Tests:  I Ordered, and personally interpreted labs.  The pertinent results include: None   Imaging Studies ordered:  I ordered imaging studies including x-ray of the right wrist I independently visualized and interpreted imaging which showed fracture and slight angulation of the distal radius I agree with the radiologist interpretation   Medicines ordered and prescription drug management:  I ordered medication including Percocet    I have reviewed the patients home medicines and have made adjustments as needed   Problem List / ED Course:  Splint placed by myself, good neurovascular function after placement, see separate procedure note  Consultation: I discussed the care with Dr. Jonna Netter, he has been kind enough to see this patient in consultation as an outpatient, he is agreeable to the plan of splint and immobilize and follow-up.  I described this to the patient, she is agreeable to the plan and has been given his contact information    Social Determinants of Health:  none           Final Clinical Impression(s) / ED Diagnoses Final diagnoses:  Closed Colles' fracture of right radius, initial encounter    Rx / DC Orders ED Discharge Orders  Ordered    oxyCODONE -acetaminophen  (PERCOCET/ROXICET) 5-325 MG tablet  Every 6 hours PRN        12/27/23 2130              Early Glisson, MD 12/27/23 2131

## 2023-12-27 NOTE — ED Triage Notes (Signed)
 Pt stated that she had a seizure in the shower a few hours ago and when she fell, she landed on her right wrist. Obvious deformity and swelling noted. Pt has no ROM and can't move fingers

## 2023-12-27 NOTE — ED Notes (Signed)
 Portable xray at bedside.

## 2023-12-27 NOTE — ED Notes (Signed)
 Orthoglass splint applied to R arm by Dr. Annabell Key

## 2023-12-29 ENCOUNTER — Telehealth: Payer: Self-pay | Admitting: *Deleted

## 2023-12-29 ENCOUNTER — Telehealth: Payer: Self-pay

## 2023-12-29 NOTE — Telephone Encounter (Signed)
 Dr. Ty Gales has a letter dated 6/2. This can be reprinted.  For seizure management, notes indicate she was to follow-up with [facility] with Dr. Alease Amend.

## 2023-12-29 NOTE — Telephone Encounter (Signed)
 Pt c/o: seizure Missed medications?  No. Sleep deprived?  No. Alcohol intake?  No. Increased stress? Yes.   Pt is stressed over her apartment still, she said they told her they didn't get the letter and if she didn't move they would put her out, pt is going to come by tomorrow 12/30/23 to get the letter to take to the apartment complex  Any change in medication color or shape? No. Any trigger? No Back to their usual baseline self?  Yes.  . If no, advise go to ER Current medications prescribed by Dr. Ty Gales:   zonisamide  (ZONEGRAN ) 100 MG capsule TAKE ONE (1) CAPSULE BY MOUTH ONCE DAILY IN THE MORNING AND TAKE FIVE (5) CAPSULES IN THE EVENING,  oxcarbazepine  (TRILEPTAL ) 600 MG tablet Take 1 tablet two times daily   brivaracetam  (BRIVIACT ) 100 MG TABS tablet Take 1 tablet (100 mg total) by mouth 2 (two) times daily.     Pt had a seizure in the shower that resulted in a fall and she broke her right wrist,

## 2023-12-29 NOTE — Telephone Encounter (Signed)
 Pt called regarding being referred to MD who was not on call.  RNCM reviewed Amion to find out which provider for pt condition was on call and sent referral as requested by pt.  Tashara Suder J. Rachel Budds, BSN, RN, NCM  Transitions of Care  Nurse Case Manager  [facility] Emergency Departments  Operative Services  [phone]

## 2023-12-30 ENCOUNTER — Other Ambulatory Visit: Payer: Self-pay | Admitting: Orthopedic Surgery

## 2023-12-30 DIAGNOSIS — M25531 Pain in right wrist: Secondary | ICD-10-CM | POA: Diagnosis not present

## 2024-01-01 NOTE — Patient Instructions (Signed)
 SURGICAL WAITING ROOM VISITATION  Patients having surgery or a procedure may have no more than 2 support people in the waiting area - these visitors may rotate.    Children under the age of 43 must have an adult with them who is not the patient.  Visitors with respiratory illnesses are discouraged from visiting and should remain at home.  If the patient needs to stay at the hospital during part of their recovery, the visitor guidelines for inpatient rooms apply. Pre-op nurse will coordinate an appropriate time for 1 support person to accompany patient in pre-op.  This support person may not rotate.    Please refer to the [facility] website for the visitor guidelines for Inpatients (after your surgery is over and you are in a regular room).       Your procedure is scheduled on: 01/05/24   Report to [facility] Main Entrance    Report to admitting at 8:45 AM   Call this number if you have problems the morning of surgery [phone]   Do not eat food :After Midnight.   After Midnight you may have the following liquids until 8 AM DAY OF SURGERY  Water Non-Citrus Juices (without pulp, NO RED-Apple, White grape, White cranberry) Black Coffee (NO MILK/CREAM OR CREAMERS, sugar ok)  Clear Tea (NO MILK/CREAM OR CREAMERS, sugar ok) regular and decaf                             Plain Jell-O (NO RED)                                           Fruit ices (not with fruit pulp, NO RED)                                     Popsicles (NO RED)                                                               Sports drinks like Gatorade (NO RED)                  The day of surgery:  Drink ONE (1) Pre-Surgery Clear Ensure at 8 AM the morning of surgery. Drink in one sitting. Do not sip.  This drink was given to you during your hospital  pre-op appointment visit. Nothing else to drink after completing the  Pre-Surgery Clear Ensure      Oral Hygiene is also important to reduce your risk of  infection.                                    Remember - BRUSH YOUR TEETH THE MORNING OF SURGERY WITH YOUR REGULAR TOOTHPASTE   Do NOT smoke after Midnight   Stop all vitamins and herbal supplements 7 days before surgery.   Take these medicines the morning of surgery with A SIP OF WATER: Atorvastatin , Briviact , Buspar , Metoprolol , Trileptal , Oxycodone , Zonegran   You may not have any metal on your body including hair pins, jewelry, and body piercing             Do not wear make-up, lotions, powders, perfumes/cologne, or deodorant  Do not wear nail polish including gel and S&S, artificial/acrylic nails, or any other type of covering on natural nails including finger and toenails. If you have artificial nails, gel coating, etc. that needs to be removed by a nail salon please have this removed prior to surgery or surgery may need to be canceled/ delayed if the surgeon/ anesthesia feels like they are unable to be safely monitored.   Do not shave  48 hours prior to surgery.    Do not bring valuables to the hospital. [place] IS NOT             RESPONSIBLE   FOR VALUABLES.   Contacts, glasses, dentures or bridgework may not be worn into surgery.  DO NOT BRING YOUR HOME MEDICATIONS TO THE HOSPITAL. PHARMACY WILL DISPENSE MEDICATIONS LISTED ON YOUR MEDICATION LIST TO YOU DURING YOUR ADMISSION IN THE HOSPITAL!    Patients discharged on the day of surgery will not be allowed to drive home.  Someone NEEDS to stay with you for the first 24 hours after anesthesia.   Special Instructions: Bring a copy of your healthcare power of attorney and living will documents the day of surgery if you haven't scanned them before.              Please read over the following fact sheets you were given: IF YOU HAVE QUESTIONS ABOUT YOUR PRE-OP INSTRUCTIONS PLEASE CALL [phone] [name]   If you received a COVID test during your pre-op visit  it is requested that you wear a mask when out in public,  stay away from anyone that may not be feeling well and notify your surgeon if you develop symptoms. If you test positive for Covid or have been in contact with anyone that has tested positive in the last 10 days please notify you surgeon.    [facility] - Preparing for Surgery Before surgery, you can play an important role.  Because skin is not sterile, your skin needs to be as free of germs as possible.  You can reduce the number of germs on your skin by washing with CHG (chlorahexidine gluconate) soap before surgery.  CHG is an antiseptic cleaner which kills germs and bonds with the skin to continue killing germs even after washing. Please DO NOT use if you have an allergy to CHG or antibacterial soaps.  If your skin becomes reddened/irritated stop using the CHG and inform your nurse when you arrive at Short Stay. Do not shave (including legs and underarms) for at least 48 hours prior to the first CHG shower.  You may shave your face/neck.  Please follow these instructions carefully:  1.  Shower with CHG Soap the night before surgery and the  morning of surgery.  2.  If you choose to wash your hair, wash your hair first as usual with your normal  shampoo.  3.  After you shampoo, rinse your hair and body thoroughly to remove the shampoo.                             4.  Use CHG as you would any other liquid soap.  You can apply chg directly to the skin and wash.  Gently with a scrungie  or clean washcloth.  5.  Apply the CHG Soap to your body ONLY FROM THE NECK DOWN.   Do   not use on face/ open                           Wound or open sores. Avoid contact with eyes, ears mouth and   genitals (private parts).                       Wash face,  Genitals (private parts) with your normal soap.             6.  Wash thoroughly, paying special attention to the area where your    surgery  will be performed.  7.  Thoroughly rinse your body with warm water from the neck down.  8.  DO NOT shower/wash with your  normal soap after using and rinsing off the CHG Soap.                9.  Pat yourself dry with a clean towel.            10.  Wear clean pajamas.            11.  Place clean sheets on your bed the night of your first shower and do not  sleep with pets. Day of Surgery : Do not apply any lotions/deodorants the morning of surgery.  Please wear clean clothes to the hospital/surgery center.  FAILURE TO FOLLOW THESE INSTRUCTIONS MAY RESULT IN THE CANCELLATION OF YOUR SURGERY  Incentive Spirometer   An incentive spirometer is a tool that can help keep your lungs clear and active. This tool measures how well you are filling your lungs with each breath. Taking long deep breaths may help reverse or decrease the chance of developing breathing (pulmonary) problems (especially infection) following: A long period of time when you are unable to move or be active. BEFORE THE PROCEDURE  If the spirometer includes an indicator to show your best effort, your nurse or respiratory therapist will set it to a desired goal. If possible, sit up straight or lean slightly forward. Try not to slouch. Hold the incentive spirometer in an upright position. INSTRUCTIONS FOR USE  Sit on the edge of your bed if possible, or sit up as far as you can in bed or on a chair. Hold the incentive spirometer in an upright position. Breathe out normally. Place the mouthpiece in your mouth and seal your lips tightly around it. Breathe in slowly and as deeply as possible, raising the piston or the ball toward the top of the column. Hold your breath for 3-5 seconds or for as long as possible. Allow the piston or ball to fall to the bottom of the column. Remove the mouthpiece from your mouth and breathe out normally. Rest for a few seconds and repeat Steps 1 through 7 at least 10 times every 1-2 hours when you are awake. Take your time and take a few normal breaths between deep breaths. The spirometer may include an indicator to show  your best effort. Use the indicator as a goal to work toward during each repetition. After each set of 10 deep breaths, practice coughing to be sure your lungs are clear. If you have an incision (the cut made at the time of surgery), support your incision when coughing by placing a pillow or rolled up towels firmly against it. Once you  are able to get out of bed, walk around indoors and cough well. You may stop using the incentive spirometer when instructed by your caregiver.  RISKS AND COMPLICATIONS Take your time so you do not get dizzy or light-headed. If you are in pain, you may need to take or ask for pain medication before doing incentive spirometry. It is harder to take a deep breath if you are having pain. AFTER USE Rest and breathe slowly and easily. It can be helpful to keep track of a log of your progress. Your caregiver can provide you with a simple table to help with this. If you are using the spirometer at home, follow these instructions: SEEK MEDICAL CARE IF:  You are having difficultly using the spirometer. You have trouble using the spirometer as often as instructed. Your pain medication is not giving enough relief while using the spirometer. You develop fever of 100.5 F (38.1 C) or higher. SEEK IMMEDIATE MEDICAL CARE IF:  You cough up bloody sputum that had not been present before. You develop fever of 102 F (38.9 C) or greater. You develop worsening pain at or near the incision site. MAKE SURE YOU:  Understand these instructions. Will watch your condition. Will get help right away if you are not doing well or get worse. Document Released: 11/18/2006 Document Revised: 09/30/2011 Document Reviewed: 01/19/2007 [facility] Patient Information 2014 [place], [place].

## 2024-01-01 NOTE — Progress Notes (Addendum)
 COVID Vaccine received:  []  No [x]  Yes Date of any COVID positive Test in last 90 days: no PCP - Alberteen Huge MD Cardiologist - Teddie Favre MD Neurologist- Mariah Shines AquinoMD  Chest x-ray - 09/02/23 Epic EKG -  10/27/23 Epic Stress Test -  ECHO - 09/04/23 Epic Cardiac Cath -   Bowel Prep - [x]  No  []   Yes ______  Pacemaker / ICD device [x]  No []  Yes   Spinal Cord Stimulator:[x]  No []  Yes       History of Sleep Apnea? [x]  No []  Yes   CPAP used?- [x]  No []  Yes    Does the patient monitor blood sugar?          [x]  No []  Yes  []  N/A  Patient has: [x]  NO Hx DM   []  Pre-DM                 []  DM1  []   DM2 Does patient have a [employer] or Dexacom? []  No []  Yes   Fasting Blood Sugar Ranges-  Checks Blood Sugar _____ times a day  GLP1 agonist / usual dose - no GLP1 instructions:  SGLT-2 inhibitors / usual dose - no SGLT-2 instructions:   Blood Thinner / Instructions:no Aspirin  Instructions:Is not taking right now.  Comments:   Activity level: Patient is able to climb a flight of stairs without difficulty; [x]  No CP  [x]  No SOB,   Patient canperform ADLs without assistance.   Anesthesia review:   Patient denies shortness of breath, fever, cough and chest pain at PAT appointment.  Patient verbalized understanding and agreement to the Pre-Surgical Instructions that were given to them at this PAT appointment. Patient was also educated of the need to review these PAT instructions again prior to his/her surgery.I reviewed the appropriate phone numbers to call if they have any and questions or concerns.

## 2024-01-02 ENCOUNTER — Other Ambulatory Visit: Payer: Self-pay

## 2024-01-02 ENCOUNTER — Encounter (HOSPITAL_COMMUNITY): Payer: Self-pay

## 2024-01-02 ENCOUNTER — Encounter (HOSPITAL_COMMUNITY)
Admission: RE | Admit: 2024-01-02 | Discharge: 2024-01-02 | Disposition: A | Discharge status: Home / Self Care | Source: Ambulatory Visit | Attending: Orthopedic Surgery

## 2024-01-02 VITALS — BP 133/89 | HR 84 | Temp 99.3°F | Resp 16 | Ht 62.0 in | Wt 190.0 lb

## 2024-01-02 DIAGNOSIS — X58XXXA Exposure to other specified factors, initial encounter: Secondary | ICD-10-CM | POA: Diagnosis not present

## 2024-01-02 DIAGNOSIS — G40909 Epilepsy, unspecified, not intractable, without status epilepticus: Secondary | ICD-10-CM | POA: Diagnosis not present

## 2024-01-02 DIAGNOSIS — Z01818 Encounter for other preprocedural examination: Secondary | ICD-10-CM | POA: Insufficient documentation

## 2024-01-02 DIAGNOSIS — S52501A Unspecified fracture of the lower end of right radius, initial encounter for closed fracture: Secondary | ICD-10-CM | POA: Diagnosis not present

## 2024-01-02 DIAGNOSIS — Z01810 Encounter for preprocedural cardiovascular examination: Secondary | ICD-10-CM | POA: Diagnosis present

## 2024-01-02 DIAGNOSIS — Z9889 Other specified postprocedural states: Secondary | ICD-10-CM | POA: Insufficient documentation

## 2024-01-02 DIAGNOSIS — Z01812 Encounter for preprocedural laboratory examination: Secondary | ICD-10-CM | POA: Diagnosis present

## 2024-01-02 DIAGNOSIS — E871 Hypo-osmolality and hyponatremia: Secondary | ICD-10-CM | POA: Diagnosis not present

## 2024-01-02 HISTORY — DX: Myoneural disorder, unspecified: G70.9

## 2024-01-02 LAB — SURGICAL PCR SCREEN
MRSA, PCR: NEGATIVE
Staphylococcus aureus: NEGATIVE

## 2024-01-02 LAB — CBC
HCT: 36.7 % (ref 36.0–46.0)
Hemoglobin: 11.6 g/dL — ABNORMAL LOW (ref 12.0–15.0)
MCH: 29.2 pg (ref 26.0–34.0)
MCHC: 31.6 g/dL (ref 30.0–36.0)
MCV: 92.4 fL (ref 80.0–100.0)
Platelets: 423 10*3/uL — ABNORMAL HIGH (ref 150–400)
RBC: 3.97 MIL/uL (ref 3.87–5.11)
RDW: 16.5 % — ABNORMAL HIGH (ref 11.5–15.5)
WBC: 5.5 10*3/uL (ref 4.0–10.5)
nRBC: 0 % (ref 0.0–0.2)

## 2024-01-02 NOTE — Progress Notes (Signed)
 Anesthesia Chart Review   Case: [id number] Date/Time: 01/05/24 1045   Procedures:      OPEN REDUCTION INTERNAL FIXATION (ORIF) WRIST FRACTURE (Right: Wrist) - OPEN REDUCTION INTERNAL FIXATION RIGHT WRIST AND CARPAL TUNNELL RELEASE     CARPAL TUNNEL RELEASE (Right)   Anesthesia type: General   Pre-op diagnosis: RIGHT DISTAL RADIUS FRACTURE WITH DISPLACEMENT   Location: [facility] ROOM 06 / [facility]   Surgeons: Neil Balls, MD       DISCUSSION:43 y.o. 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 Neil Balls.   Pt with admission in February for acute hypoxic respiratory failure.  Elevated troponin during admission.  Follow up with cardiology 09/18/23.  Per notes Echo with normal LVED, pt asymptomatic.  Cardiac CTA ordered with calcium  score of 0.   Only cbc ordered at PAT visit, will add BMP given h/o chronic hyponatremia.  VS: BP 133/89   Pulse 84   Temp 37.4 C (Oral)   Resp 16   Ht 5' 2 (1.575 m)   Wt 86.2 kg   LMP  (LMP Unknown)   SpO2 100%   BMI 34.75 kg/m   PROVIDERS: Towanda Fret, MD is PCP    LABS: labs DOS (all labs ordered are listed, but only abnormal results are displayed)  Labs Reviewed  SURGICAL PCR SCREEN  CBC     IMAGES: CT Coronary 10/03/2023 IMPRESSION: 1. Coronary calcium  score of 0. This was 0 percentile for age-, sex, and race-matched controls.   2. Total plaque volume 11 mm3 which is 59 percentile for age- and sex-matched controls (calcified plaque 0 mm3; non-calcified plaque 11 mm3). TPV is (mild).   3. Normal coronary origin with right dominance.   4. Minimal (0-24) plaque in the proximal RCA.  EKG:   CV: Echo 09/04/2023 1. Left ventricular ejection fraction, by estimation, is 60 to 65%. The  left ventricle has normal function. The left ventricle has no regional  wall  motion abnormalities.   2. Right ventricular systolic function is normal. The right ventricular  size is normal.   3. The mitral valve is grossly normal. No evidence of mitral valve  regurgitation. No evidence of mitral stenosis.   4. The aortic valve was not well visualized. Aortic valve regurgitation  is not visualized. No aortic stenosis is present.   5. The inferior vena cava is normal in size with greater than 50%  respiratory variability, suggesting right atrial pressure of 3 mmHg.  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 ([facility])   Vision loss, bilateral 05/08/2021    Past Surgical History:  Procedure Laterality Date   BRAIN SURGERY  07/30/2022   CESAREAN SECTION     x 3    MEDICATIONS:  acyclovir  (ZOVIRAX ) 400 MG tablet   aspirin  81 MG chewable tablet   atorvastatin  (LIPITOR) 40 MG tablet   brivaracetam  (BRIVIACT ) 100 MG TABS tablet   busPIRone  (BUSPAR ) 5 MG tablet  EMGALITY  120 MG/ML SOAJ   Erenumab -aooe (AIMOVIG ) 140 MG/ML SOAJ   medroxyPROGESTERone  (DEPO-PROVERA ) 150 MG/ML injection   nortriptyline  (PAMELOR ) 25 MG capsule   oxcarbazepine  (TRILEPTAL ) 600 MG tablet   oxyCODONE -acetaminophen  (PERCOCET/ROXICET) 5-325 MG tablet   pregabalin  (LYRICA ) 50 MG capsule   tiZANidine  (ZANAFLEX ) 4 MG tablet   Ubrogepant  (UBRELVY ) 100 MG TABS   Vitamin D , Ergocalciferol , (DRISDOL ) 1.25 MG (50000 UNIT) CAPS capsule   zonisamide  (ZONEGRAN ) 100 MG capsule   No current facility-administered medications for this encounter.     Chick Cotton Ward, PA-C [facility] [phone]

## 2024-01-05 ENCOUNTER — Other Ambulatory Visit: Payer: Self-pay

## 2024-01-05 ENCOUNTER — Encounter (HOSPITAL_COMMUNITY): Payer: Self-pay | Admitting: Orthopedic Surgery

## 2024-01-05 ENCOUNTER — Other Ambulatory Visit (HOSPITAL_COMMUNITY): Payer: Self-pay

## 2024-01-05 ENCOUNTER — Encounter (HOSPITAL_COMMUNITY)
Admission: RE | Disposition: A | Discharge status: Home / Self Care | Payer: Self-pay | Source: Home / Self Care | Attending: Orthopedic Surgery

## 2024-01-05 ENCOUNTER — Ambulatory Visit (HOSPITAL_COMMUNITY): Admitting: Physician Assistant

## 2024-01-05 ENCOUNTER — Ambulatory Visit (HOSPITAL_COMMUNITY)
Admission: RE | Admit: 2024-01-05 | Discharge: 2024-01-05 | Disposition: A | Discharge status: Home / Self Care | Attending: Orthopedic Surgery | Admitting: Orthopedic Surgery

## 2024-01-05 ENCOUNTER — Ambulatory Visit (HOSPITAL_COMMUNITY): Admitting: Certified Registered"

## 2024-01-05 DIAGNOSIS — S52501A Unspecified fracture of the lower end of right radius, initial encounter for closed fracture: Secondary | ICD-10-CM | POA: Insufficient documentation

## 2024-01-05 DIAGNOSIS — G5601 Carpal tunnel syndrome, right upper limb: Secondary | ICD-10-CM | POA: Diagnosis not present

## 2024-01-05 DIAGNOSIS — F17210 Nicotine dependence, cigarettes, uncomplicated: Secondary | ICD-10-CM | POA: Insufficient documentation

## 2024-01-05 DIAGNOSIS — E871 Hypo-osmolality and hyponatremia: Secondary | ICD-10-CM

## 2024-01-05 DIAGNOSIS — W19XXXA Unspecified fall, initial encounter: Secondary | ICD-10-CM | POA: Insufficient documentation

## 2024-01-05 DIAGNOSIS — G40909 Epilepsy, unspecified, not intractable, without status epilepticus: Secondary | ICD-10-CM | POA: Insufficient documentation

## 2024-01-05 DIAGNOSIS — S52591A Other fractures of lower end of right radius, initial encounter for closed fracture: Secondary | ICD-10-CM

## 2024-01-05 HISTORY — PX: ORIF WRIST FRACTURE: SHX2133

## 2024-01-05 HISTORY — PX: CARPAL TUNNEL RELEASE: SHX101

## 2024-01-05 LAB — BASIC METABOLIC PANEL WITH GFR
Anion gap: 9 (ref 5–15)
BUN: <5 mg/dL — ABNORMAL LOW (ref 6–20)
CO2: 18 mmol/L — ABNORMAL LOW (ref 22–32)
Calcium: 9 mg/dL (ref 8.9–10.3)
Chloride: 104 mmol/L (ref 98–111)
Creatinine, Ser: 0.62 mg/dL (ref 0.44–1.00)
GFR, Estimated: >60 mL/min (ref >60)
Glucose, Bld: 63 mg/dL — ABNORMAL LOW (ref 70–99)
Potassium: 3.9 mmol/L (ref 3.5–5.1)
Sodium: 131 mmol/L — ABNORMAL LOW (ref 135–145)

## 2024-01-05 LAB — POCT PREGNANCY, URINE: Preg Test, Ur: NEGATIVE

## 2024-01-05 SURGERY — OPEN REDUCTION INTERNAL FIXATION (ORIF) WRIST FRACTURE
Anesthesia: Monitor Anesthesia Care | Site: Wrist | Laterality: Right

## 2024-01-05 MED ORDER — CLONIDINE HCL (ANALGESIA) 100 MCG/ML EP SOLN: EPIDURAL | Status: DC | PRN

## 2024-01-05 MED ORDER — BUPIVACAINE-EPINEPHRINE (PF) 0.5% -1:200000 IJ SOLN
INTRAMUSCULAR | Status: DC | PRN
Start: 2024-01-05 — End: 2024-01-05

## 2024-01-05 MED ORDER — OXYCODONE-ACETAMINOPHEN 5-325 MG PO TABS
1.0000 | ORAL_TABLET | ORAL | 0 refills | Status: DC | PRN
  Filled 2024-01-05: qty 30, 5d supply, fill #0

## 2024-01-05 MED ORDER — PROPOFOL 10 MG/ML IV BOLUS: INTRAVENOUS | Status: DC | PRN

## 2024-01-05 MED ORDER — CEFAZOLIN SODIUM-DEXTROSE 2-4 GM/100ML-% IV SOLN
2.0000 g | INTRAVENOUS | Status: AC
  Filled 2024-01-05: qty 100

## 2024-01-05 MED ORDER — CHLORHEXIDINE GLUCONATE 0.12 % MT SOLN
15.0000 mL | Freq: Once | OROMUCOSAL | Status: AC
  Administered 2024-01-05: 15 mL via OROMUCOSAL

## 2024-01-05 MED ORDER — MIDAZOLAM HCL 2 MG/2ML IJ SOLN
INTRAMUSCULAR | Status: AC
  Filled 2024-01-05: qty 2

## 2024-01-05 MED ORDER — OXYCODONE HCL 5 MG PO TABS: 5.0000 mg | ORAL_TABLET | Freq: Once | ORAL | Status: DC | PRN

## 2024-01-05 MED ORDER — 0.9 % SODIUM CHLORIDE (POUR BTL) OPTIME
TOPICAL | Status: DC | PRN
  Administered 2024-01-05: 1000 mL

## 2024-01-05 MED ORDER — OXYCODONE HCL 5 MG/5ML PO SOLN: 5.0000 mg | Freq: Once | ORAL | Status: DC | PRN

## 2024-01-05 MED ORDER — ACETAMINOPHEN 500 MG PO TABS
1000.0000 mg | ORAL_TABLET | Freq: Once | ORAL | Status: AC
  Administered 2024-01-05: 1000 mg via ORAL
  Filled 2024-01-05: qty 2

## 2024-01-05 MED ORDER — FENTANYL CITRATE (PF) 250 MCG/5ML IJ SOLN
INTRAMUSCULAR | Status: AC
  Filled 2024-01-05: qty 5

## 2024-01-05 MED ORDER — PROPOFOL 10 MG/ML IV BOLUS
INTRAVENOUS | Status: AC
  Filled 2024-01-05: qty 20

## 2024-01-05 MED ORDER — MUPIROCIN 2 % EX OINT
1.0000 | TOPICAL_OINTMENT | Freq: Two times a day (BID) | CUTANEOUS | 0 refills | Status: AC
  Filled 2024-01-05: qty 66, 33d supply, fill #0

## 2024-01-05 MED ORDER — FENTANYL CITRATE PF 50 MCG/ML IJ SOSY: 25.0000 ug | PREFILLED_SYRINGE | INTRAMUSCULAR | Status: DC | PRN

## 2024-01-05 MED ORDER — DROPERIDOL 2.5 MG/ML IJ SOLN
0.6250 mg | Freq: Once | INTRAMUSCULAR | Status: DC | PRN
Start: 2024-01-05 — End: 2024-01-05

## 2024-01-05 MED ORDER — LACTATED RINGERS IV SOLN: INTRAVENOUS | Status: DC

## 2024-01-05 MED ORDER — FENTANYL CITRATE PF 50 MCG/ML IJ SOSY
100.0000 ug | PREFILLED_SYRINGE | Freq: Once | INTRAMUSCULAR | Status: AC
  Administered 2024-01-05: 50 ug via INTRAVENOUS
  Filled 2024-01-05: qty 2

## 2024-01-05 MED ORDER — PROPOFOL 500 MG/50ML IV EMUL: INTRAVENOUS | Status: DC | PRN

## 2024-01-05 MED ORDER — MIDAZOLAM HCL 2 MG/2ML IJ SOLN
2.0000 mg | Freq: Once | INTRAMUSCULAR | Status: AC
  Administered 2024-01-05: 1 mg via INTRAVENOUS
  Filled 2024-01-05: qty 2

## 2024-01-05 MED ORDER — LIDOCAINE 2% (20 MG/ML) 5 ML SYRINGE: INTRAMUSCULAR | Status: DC | PRN

## 2024-01-05 MED ORDER — ORAL CARE MOUTH RINSE
15.0000 mL | Freq: Once | OROMUCOSAL | Status: AC
Start: 2024-01-05 — End: 2024-01-05

## 2024-01-05 MED ORDER — CHLORHEXIDINE GLUCONATE 4 % EX SOLN
1.0000 | CUTANEOUS | 1 refills | Status: DC
  Filled 2024-01-05 – 2024-01-19 (×2): qty 946, 30d supply, fill #0

## 2024-01-05 SURGICAL SUPPLY — 48 items
BAG COUNTER SPONGE SURGICOUNT (BAG) IMPLANT
BIT DRILL 2.2 SS TIBIAL (BIT) IMPLANT
BNDG ELASTIC 3INX 5YD STR LF (GAUZE/BANDAGES/DRESSINGS) ×2 IMPLANT
BNDG ESMARK 4X9 LF (GAUZE/BANDAGES/DRESSINGS) ×2 IMPLANT
BNDG GAUZE DERMACEA FLUFF 4 (GAUZE/BANDAGES/DRESSINGS) IMPLANT
CORD BIPOLAR FORCEPS 12FT (ELECTRODE) ×2 IMPLANT
COVER MAYO STAND STRL (DRAPES) IMPLANT
COVER SURGICAL LIGHT HANDLE (MISCELLANEOUS) ×2 IMPLANT
CUFF TOURN SGL QUICK 18X4 (TOURNIQUET CUFF) ×2 IMPLANT
CUFF TRNQT CYL 24X4X16.5-23 (TOURNIQUET CUFF) ×2 IMPLANT
DRAPE U-SHAPE 47X51 STRL (DRAPES) ×2 IMPLANT
DRSG EMULSION OIL 3X3 NADH (GAUZE/BANDAGES/DRESSINGS) ×2 IMPLANT
DURAPREP 26ML APPLICATOR (WOUND CARE) ×2 IMPLANT
GAUZE SPONGE 4X4 12PLY STRL (GAUZE/BANDAGES/DRESSINGS) ×2 IMPLANT
GLOVE BIO SURGEON STRL SZ8 (GLOVE) ×2 IMPLANT
GLOVE BIOGEL PI IND STRL 8 (GLOVE) ×4 IMPLANT
GLOVE ECLIPSE 7.5 STRL STRAW (GLOVE) ×4 IMPLANT
GOWN STRL REUS W/ TWL LRG LVL3 (GOWN DISPOSABLE) ×2 IMPLANT
GOWN STRL REUS W/ TWL XL LVL3 (GOWN DISPOSABLE) ×2 IMPLANT
KIT BASIN OR (CUSTOM PROCEDURE TRAY) ×2 IMPLANT
KIT TURNOVER KIT A (KITS) ×6 IMPLANT
KWIRE FX5X1.6XNS BN SS (WIRE) IMPLANT
MANIFOLD NEPTUNE II (INSTRUMENTS) ×2 IMPLANT
NDL HYPO 25X1 1.5 SAFETY (NEEDLE) ×2 IMPLANT
NEEDLE HYPO 25X1 1.5 SAFETY (NEEDLE) IMPLANT
PACK ORTHO EXTREMITY (CUSTOM PROCEDURE TRAY) ×2 IMPLANT
PAD ARMBOARD POSITIONER FOAM (MISCELLANEOUS) ×4 IMPLANT
PAD CAST 3X4 CTTN HI CHSV (CAST SUPPLIES) ×2 IMPLANT
PADDING CAST ABS COTTON 3X4 (CAST SUPPLIES) IMPLANT
PEG LOCKING SMOOTH 2.2X18 (Peg) IMPLANT
PEG LOCKING SMOOTH 2.2X20 (Screw) IMPLANT
PEG LOCKING SMOOTH 2.2X22 (Screw) IMPLANT
PENCIL SMOKE EVACUATOR (MISCELLANEOUS) IMPLANT
PLATE NARROW DVR RIGHT (Plate) IMPLANT
PROTECTOR NERVE ULNAR (MISCELLANEOUS) ×2 IMPLANT
SCREW BN 14X2.7XNONLOCK 3 LD (Screw) IMPLANT
SCREW LP NL 2.7X15MM (Screw) IMPLANT
SCREW MULTI DIRECTIONAL 2.7X16 (Screw) IMPLANT
SLING ARM IMMOBILIZER MED (SOFTGOODS) IMPLANT
SPLINT PLASTER CAST XFAST 4X15 (CAST SUPPLIES) IMPLANT
SPLINT PLASTER GYPS XFAST 3X15 (CAST SUPPLIES) ×2 IMPLANT
SUCTION TUBE FRAZIER 10FR DISP (SUCTIONS) IMPLANT
SUT ETHILON 4 0 PS 2 18 (SUTURE) ×2 IMPLANT
SUT VIC AB 0 CT1 36 (SUTURE) ×2 IMPLANT
SUT VIC AB 4-0 PS2 27 (SUTURE) IMPLANT
SYR CONTROL 10ML LL (SYRINGE) IMPLANT
TOWEL GREEN STERILE FF (TOWEL DISPOSABLE) ×2 IMPLANT
TUBING CONNECTING 10 (TUBING) ×2 IMPLANT

## 2024-01-05 NOTE — Anesthesia Postprocedure Evaluation (Signed)
 Anesthesia Post Note  Patient: [name]  Procedure(s) Performed: OPEN REDUCTION INTERNAL FIXATION (ORIF) WRIST FRACTURE (Right: Wrist) CARPAL TUNNEL RELEASE (Right)     Patient location during evaluation: PACU Anesthesia Type: MAC Level of consciousness: awake and alert Pain management: pain level controlled Vital Signs Assessment: post-procedure vital signs reviewed and stable Respiratory status: spontaneous breathing, nonlabored ventilation and respiratory function stable Cardiovascular status: blood pressure returned to baseline Postop Assessment: no apparent nausea or vomiting Anesthetic complications: no   No notable events documented.  Last Vitals:  Vitals:   01/05/24 1055 01/05/24 1330  BP: 120/82 134/87  Pulse: 94 85  Resp: 14 17  Temp:  (!) 36.2 C  SpO2: 99% 95%    Last Pain:  Vitals:   01/05/24 1330  TempSrc:   PainSc: 0-No pain                 Rayfield Cairo

## 2024-01-05 NOTE — Anesthesia Procedure Notes (Signed)
 Anesthesia Regional Block: Supraclavicular block   Pre-Anesthetic Checklist: , timeout performed,  Correct Patient, Correct Site, Correct Laterality,  Correct Procedure, Correct Position, site marked,  Risks and benefits discussed,  Pre-op evaluation,  At surgeon's request and post-op pain management  Laterality: Right  Prep: Maximum Sterile Barrier Precautions used, chloraprep       Needles:  Injection technique: Single-shot  Needle Type: Echogenic Stimulator Needle     Needle Length: 9cm  Needle Gauge: 22     Additional Needles:   Procedures:,,,, ultrasound used (permanent image in chart),,    Narrative:  Start time: 01/05/2024 10:37 AM End time: 01/05/2024 10:40 AM Injection made incrementally with aspirations every 5 mL.  Performed by: Personally  Anesthesiologist: Vernadine Golas, MD  Additional Notes: Risks, benefits, and alternative discussed. Patient gave consent for procedure. Patient prepped and draped in sterile fashion. Sedation administered, patient remains easily responsive to voice. Relevant anatomy identified with ultrasound guidance. Local anesthetic given in 5cc increments with no signs or symptoms of intravascular injection. No pain or paraesthesias with injection. Patient monitored throughout procedure with signs of LAST or immediate complications. Tolerated well. Ultrasound image placed in chart.  Amador Junes, MD

## 2024-01-05 NOTE — H&P (Signed)
 A pre op hand p   Chief Complaint: Right wrist pain with numbness and tingling in her fingers  HPI: [name] is a 43 y.o. female who presents for evaluation of right wrist pain with numbness and tingling in the fingers. It has been present for 1 week since a fall with known distal radius fracture and has been worsening. She has failed conservative measures. Pain is rated as moderate.  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 ([facility])   Vision loss, bilateral 05/08/2021   Past Surgical History:  Procedure Laterality Date   BRAIN SURGERY  07/30/2022   CESAREAN SECTION     x 3   Social History   Socioeconomic History   Marital status: Single    Spouse name: Not on file   Number of children: 2   Years of education: 16, GED   Highest education level: 10th grade  Occupational History    Comment: na  Tobacco Use   Smoking status: Every Day    Current packs/day: 1.00    Average packs/day: 1 pack/day for 10.0 years (10.0 ttl pk-yrs)    Types: Cigarettes    Passive exposure: Current   Smokeless tobacco: Never   Tobacco comments:    Offered Smoking Cessation.  Vaping Use   Vaping status: Never Used  Substance and Sexual Activity   Alcohol use: Not Currently    Alcohol/week: 2.0 standard drinks of alcohol    Types: 2 Cans of beer per week    Comment: last drink in December   Drug use: Yes    Types: Marijuana    Comment: everyday   Sexual activity: Not Currently    Partners: Male    Birth control/protection: None, Injection  Other Topics Concern   Not on file  Social History Narrative   Lives  With 1 child in home       Caffeine  -Pepsi 2 daily       Right handed      Highest level of edu- 11 grade      Disabled   Social Drivers of Health   Financial Resource Strain: Low Risk  (07/30/2023)   Overall Financial Resource Strain (CARDIA)    Difficulty of Paying Living Expenses: Not hard at all  Food Insecurity: No Food Insecurity (08/30/2023)   Hunger Vital Sign    Worried About Running Out of Food in the Last Year: Never true    Ran Out of Food in the Last Year: Never true  Transportation Needs: No Transportation Needs (08/30/2023)   PRAPARE - Administrator, Civil Service (Medical): No    Lack of Transportation (Non-Medical): No  Physical Activity: Inactive (07/30/2023)   Exercise Vital Sign    Days of Exercise per Week: 0 days    Minutes of Exercise per Session: 0 min  Stress: No Stress Concern Present (07/30/2023)   [employer] of Occupational Health - Occupational Stress Questionnaire    Feeling of Stress : Not at all  Social Connections: Socially Isolated (09/04/2023)   Social Connection and Isolation  Panel    Frequency of Communication with Friends and Family: More than three times a week    Frequency of Social Gatherings with Friends and Family: Never    Attends Religious Services: Never    Database administrator or Organizations: No    Attends Engineer, structural: Never    Marital Status: Divorced   Family History  Problem Relation Age of Onset   Hypertension Mother    Healthy Father    Allergies  Allergen Reactions   Morphine Other (See Comments)    Unknown Reaction     Prior to Admission medications   Medication Sig Start Date End Date Taking? Authorizing Provider  acyclovir  (ZOVIRAX ) 400 MG tablet Take 1 tablet (400 mg total) by mouth 2 (two) times daily. 12/18/23  Yes Del Amber Bail, Rogerio Clay, FNP  atorvastatin  (LIPITOR) 40 MG tablet Take 1 tablet (40 mg total) by mouth daily. 12/25/23 03/24/24 Yes Towanda Fret, MD  brivaracetam  (BRIVIACT ) 100 MG TABS  tablet Take 1 tablet (100 mg total) by mouth 2 (two) times daily. 09/26/23  Yes Jhonny Moss, MD  busPIRone  (BUSPAR ) 5 MG tablet TAKE 1 TABLET(5 MG) BY MOUTH TWICE DAILY Patient taking differently: Take 5 mg by mouth 2 (two) times daily. 10/06/23  Yes Towanda Fret, MD  oxcarbazepine  (TRILEPTAL ) 600 MG tablet Take 1 tablet two times daily . 09/26/23  Yes Jhonny Moss, MD  oxyCODONE -acetaminophen  (PERCOCET/ROXICET) 5-325 MG tablet Take 1 tablet by mouth every 6 (six) hours as needed for severe pain (pain score 7-10). 12/27/23  Yes Early Glisson, MD  pregabalin  (LYRICA ) 50 MG capsule Take 1 capsule every night 11/05/23  Yes Jhonny Moss, MD  tiZANidine  (ZANAFLEX ) 4 MG tablet Take 1 tablet (4 mg total) by mouth every 6 (six) hours as needed for muscle spasms. 12/12/23  Yes Jhonny Moss, MD  Vitamin D , Ergocalciferol , (DRISDOL ) 1.25 MG (50000 UNIT) CAPS capsule Take 1 capsule (50,000 Units total) by mouth every 7 (seven) days. 08/14/23  Yes Towanda Fret, MD  zonisamide  (ZONEGRAN ) 100 MG capsule TAKE ONE (1) CAPSULE BY MOUTH ONCE DAILY IN THE MORNING AND TAKE FIVE (5) CAPSULES IN THE EVENING Patient taking differently: Take 100-500 mg by mouth 2 (two) times daily. Take one capsule by mouth in the morning and take five capsules in the evening. 10/14/23  Yes Jhonny Moss, MD  aspirin  81 MG chewable tablet Chew 1 tablet (81 mg total) by mouth daily. Patient not taking: Reported on 01/02/2024 09/06/23   Vada Garibaldi, MD  EMGALITY  120 MG/ML SOAJ Inject 1 Dose into the skin as needed (migraine). Patient not taking: Reported on 01/02/2024 12/19/23   [provider]  Erenumab -aooe (AIMOVIG ) 140 MG/ML SOAJ Inject 140 mg into the skin every 30 (thirty) days. Patient not taking: Reported on 01/02/2024 10/08/23   Jhonny Moss, MD  medroxyPROGESTERone  (DEPO-PROVERA ) 150 MG/ML injection ADMINISTER 1 ML(150 MG) IN THE MUSCLE EVERY 3 MONTHS Patient not taking: Reported on 01/02/2024 08/26/23    Javan Messing, NP  nortriptyline  (PAMELOR ) 25 MG capsule TAKE 2 CAPSULES BY MOUTH EVERY NIGHT Patient not taking: Reported on 01/02/2024 09/26/23   Jhonny Moss, MD  Ubrogepant  (UBRELVY ) 100 MG TABS Take 1 tablet as needed at onset of migraine. Do not take more than 3 a week Patient not taking: Reported on 01/02/2024 09/15/23   Jhonny Moss, MD     Positive ROS: None  All other systems have been reviewed and  were otherwise negative with the exception of those mentioned in the HPI and as above.  Physical Exam: Vitals:   01/05/24 1050 01/05/24 1055  BP: 136/71 120/82  Pulse: 89 94  Resp: 14 14  Temp:    SpO2: 99% 99%    General: Alert, no acute distress Cardiovascular: No pedal edema Respiratory: No cyanosis, no use of accessory musculature GI: No organomegaly, abdomen is soft and non-tender Skin: No lesions in the area of chief complaint Neurologic: Sensation intact distally Psychiatric: Patient is competent for consent with normal mood and affect Lymphatic: No axillary or cervical lymphadenopathy  MUSCULOSKELETAL: Right wrist has pain through range of motion.  Finger motion is limited because of swelling.  She has some decreased sensation in her thumb index and long fingers.  Assessment/Plan: RIGHT DISTAL RADIUS FRACTURE WITH DISPLACEMENT Plan for Procedure(s): OPEN REDUCTION INTERNAL FIXATION (ORIF) WRIST FRACTURE CARPAL TUNNEL RELEASE  The risks benefits and alternatives were discussed with the patient including but not limited to the risks of nonoperative treatment, versus surgical intervention including infection, bleeding, nerve injury, malunion, nonunion, hardware prominence, hardware failure, need for hardware removal, blood clots, cardiopulmonary complications, morbidity, mortality, among others, and they were willing to proceed.  Predicted outcome is good, although there will be at least a six to nine month expected recovery.  Boston Byers,  MD 01/05/2024 11:07 AM

## 2024-01-05 NOTE — Transfer of Care (Signed)
 Immediate Anesthesia Transfer of Care Note  Patient: [name]  Procedure(s) Performed: OPEN REDUCTION INTERNAL FIXATION (ORIF) WRIST FRACTURE (Right: Wrist) CARPAL TUNNEL RELEASE (Right)  Patient Location: PACU  Anesthesia Type:MAC combined with regional for post-op pain  Level of Consciousness: drowsy and patient cooperative  Airway & Oxygen  Therapy: Patient Spontanous Breathing  Post-op Assessment: Report given to RN and Post -op Vital signs reviewed and stable  Post vital signs: Reviewed and stable  Last Vitals:  Vitals Value Taken Time  BP 134/87 01/05/24 13:31  Temp    Pulse 81 01/05/24 13:32  Resp 13 01/05/24 13:32  SpO2 94 % 01/05/24 13:32  Vitals shown include unfiled device data.  Last Pain:  Vitals:   01/05/24 1055  TempSrc:   PainSc: 0-No pain         Complications: No notable events documented.

## 2024-01-05 NOTE — Op Note (Signed)
 PATIENT ID:      [name]  MRN:     [id number] DOB/AGE:    11/13/1980 / 43 y.o.       OPERATIVE REPORT    DATE OF PROCEDURE:  01/05/2024       PREOPERATIVE DIAGNOSIS:   1.RIGHT DISTAL RADIUS FRACTURE WITH DISPLACEMENT.  2 carpal tunnel symptoms right wrist     Estimated body mass index is 34.75 kg/m as calculated from the following:   Height as of this encounter: 5' 2 (1.575 m).   Weight as of this encounter: 86.2 kg.                                                        POSTOPERATIVE DIAGNOSIS:   1.RIGHT DISTAL RADIUS FRACTURE WITH DISPLACEMENT 2.carpal tunnel symptoms right wrist                                                                    PROCEDURE:  Procedure(s): 1.OPEN REDUCTION INTERNAL FIXATION (ORIF) WRIST FRACTURE 2.CARPAL TUNNEL RELEASE     SURGEON: Boston Byers    ASSISTANT:   Gibson Kurtz PA-C   (Present and scrubbed throughout the case, critical for assistance with exposure, retraction, and closure.)         ANESTHESIA: GET    TOURNIQUET TIME: @90min    COMPLICATIONS:  None        SPECIMENS: None   INDICATIONS FOR PROCEDURE: The patient has distal radius fracture after a fall.  She was having significant complaints of numbness tingling in her thumb index and long finger as well.  We discussed treatment options but felt that the open duction internal fixation of her wrist fracture would be the best way to get her back to doing activity as quick as possible.  She was brought to the operating room for this procedure.  We talked preoperatively about doing a carpal tunnel release because of her numbness in her thumb index and long fingers.   A tourniquet was placed on the forearm. The upper extremity was prepped and draped in the usual sterile fashion from the tourniquet to the fingertips. A timeout procedure was performed.following this the arm was exsanguinated and the blood pressure tourniquet was inflated to 250 mmHg.  An incision was made along the length  of the flexor carpi radialis tendon.  Subcutaneous tissue was dissected down to the level of the fascia which was opened and then blunt dissection was taken down to the level of the distal radius the pronator quadratus was identified and released on its radial border.  The distal radius fracture was identified at this point.  Healing elements were removed from the distal radius fracture and once this was done the fracture was increased and then a manipulative closed reduction was undertaken.  The narrowest plate was placed along the radius and under fluoroscopic guidance we got it adjusted to the point would like.  K wire was used to hold the plate initially and then the distal K wire was used to make sure that the pegs would be behind the articular  surface.  Once were satisfied with this the 3 smooth pegs were drilled and placed proximally.  The radial most of these was drilled with a multidirectional just to make sure that we are going to be inside the bone.  The distalmost pegs were then placed.  Fluoroscopic imaging was used multiple times during this portion of the case to make sure that we are in appropriate position with the plate.  Following this the proximal screws were placed.  Fluoroscopic imaging was again used to make sure that the screw lengths were appropriate and in good position.  Final images were taken and were satisfied everything was in good position the wound was irrigated.  Attention was then turned towards the median nerve.  The median nerve was identified and tracked down into the wrist.  The volar carpal ligament was identified and released.  This was done under direct vision from proximal to distal.  At this point attention was turned back towards the wound.  The pronator quadratus was repaired with 4-0 Vicryl interrupted sutures x 2.  The skin was then closed with a combination of interrupted and running Vicryl sutures.  A sterile compressive dressing as well as a volar plaster were  applied.  At this point, a sling was applied, the patient was laid supine awakened extubated and taken to the recovery room.  Gibson Kurtz was present with entire case and assisted by retraction of tissues, manipulation of the arm, and closing the minimize OR time.

## 2024-01-05 NOTE — Anesthesia Preprocedure Evaluation (Addendum)
 Anesthesia Evaluation  Patient identified by MRN, date of birth, ID band Patient awake    Reviewed: Allergy & Precautions, NPO status , Patient's Chart, lab work & pertinent test results  History of Anesthesia Complications Negative for: history of anesthetic complications  Airway Mallampati: II  TM Distance: >3 FB Neck ROM: Full    Dental  (+) Missing,    Pulmonary Current Smoker   Pulmonary exam normal        Cardiovascular Normal cardiovascular exam  Echo 09/04/2023 1. Left ventricular ejection fraction, by estimation, is 60 to 65%. The  left ventricle has normal function. The left ventricle has no regional  wall motion abnormalities.   2. Right ventricular systolic function is normal. The right ventricular  size is normal.   3. The mitral valve is grossly normal. No evidence of mitral valve  regurgitation. No evidence of mitral stenosis.   4. The aortic valve was not well visualized. Aortic valve regurgitation  is not visualized. No aortic stenosis is present.   5. The inferior vena cava is normal in size with greater than 50%  respiratory variability, suggesting right atrial pressure of 3 mmHg.     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  Reproductive/Obstetrics                             Anesthesia Physical Anesthesia Plan  ASA: 2  Anesthesia Plan: MAC   Post-op Pain Management: Tylenol  PO (pre-op)*    Induction:   PONV Risk Score and Plan: 1 and Treatment may vary due to age or medical condition, Propofol  infusion, Ondansetron  and Midazolam  Airway Management Planned: Natural Airway and Simple Face Mask  Additional Equipment: None  Intra-op Plan:   Post-operative Plan:   Informed Consent: I have reviewed the patients History and Physical, chart, labs and discussed the procedure including the risks, benefits and alternatives for the proposed anesthesia with the patient or authorized representative who has indicated his/her understanding and acceptance.     Dental advisory given  Plan Discussed with: CRNA  Anesthesia Plan Comments:        Anesthesia Quick Evaluation

## 2024-01-06 ENCOUNTER — Encounter (HOSPITAL_COMMUNITY): Payer: Self-pay | Admitting: Orthopedic Surgery

## 2024-01-08 ENCOUNTER — Other Ambulatory Visit (HOSPITAL_COMMUNITY): Payer: Self-pay

## 2024-01-12 ENCOUNTER — Other Ambulatory Visit: Payer: Self-pay | Admitting: Family Medicine

## 2024-01-12 ENCOUNTER — Telehealth: Payer: Self-pay

## 2024-01-12 NOTE — Telephone Encounter (Signed)
 Copied from CRM [id number]. Topic: Clinical - Medication Question >> Jan 12, 2024 11:25 AM Silvana PARAS wrote: Reason for CRM: Pt calling to verify if both medications acyclovir  (ZOVIRAX ) 400 MG tablet, and Famiciciovir 250 MG( not on list) are approved to take together or one medication at a time due to breakouts on inside of lip. Callback number is [phone].

## 2024-01-13 ENCOUNTER — Telehealth: Payer: Self-pay

## 2024-01-13 NOTE — Telephone Encounter (Signed)
 Lvm to cb

## 2024-01-13 NOTE — Telephone Encounter (Signed)
 Copied from CRM [id number]. Topic: General - Other >> Jan 13, 2024  9:34 AM Delon DASEN wrote: Reason for CRM: need to speak with nurse again at the office, she was just speaking with her and forgot something they told her- please [phone]

## 2024-01-13 NOTE — Telephone Encounter (Signed)
Pt informed

## 2024-01-14 ENCOUNTER — Encounter: Payer: Self-pay | Admitting: Neurology

## 2024-01-14 ENCOUNTER — Telehealth (INDEPENDENT_AMBULATORY_CARE_PROVIDER_SITE_OTHER): Payer: 59 | Admitting: Neurology

## 2024-01-14 DIAGNOSIS — G40019 Localization-related (focal) (partial) idiopathic epilepsy and epileptic syndromes with seizures of localized onset, intractable, without status epilepticus: Secondary | ICD-10-CM | POA: Diagnosis not present

## 2024-01-14 MED ORDER — NORTRIPTYLINE HCL 25 MG PO CAPS: ORAL_CAPSULE | ORAL | 3 refills | Status: AC

## 2024-01-14 MED ORDER — OXCARBAZEPINE 600 MG PO TABS: ORAL_TABLET | ORAL | 3 refills | Status: AC

## 2024-01-14 MED ORDER — BRIVARACETAM 100 MG PO TABS: 100.0000 mg | ORAL_TABLET | Freq: Two times a day (BID) | ORAL | 5 refills | Status: DC

## 2024-01-14 MED ORDER — PREGABALIN 50 MG PO CAPS: ORAL_CAPSULE | ORAL | 3 refills | Status: DC

## 2024-01-14 MED ORDER — ZONISAMIDE 100 MG PO CAPS: ORAL_CAPSULE | ORAL | 3 refills | Status: AC

## 2024-01-14 MED ORDER — UBRELVY 100 MG PO TABS: ORAL_TABLET | ORAL | 5 refills | Status: DC

## 2024-01-14 MED ORDER — AIMOVIG 140 MG/ML ~~LOC~~ SOAJ: 1.0000 mL | SUBCUTANEOUS | 11 refills | Status: DC

## 2024-01-14 NOTE — Progress Notes (Signed)
 Virtual Visit via Video Note The purpose of this virtual visit is to provide medical care while limiting exposure to the novel coronavirus.    Consent was obtained for video visit:  Yes.   Answered questions that patient had about telehealth interaction:  Yes.   I discussed the limitations, risks, security and privacy concerns of performing an evaluation and management service by telemedicine. I also discussed with the patient that there may be a patient responsible charge related to this service. The patient expressed understanding and agreed to proceed.  Pt location: Home Physician Location: office Name of referring provider:  Antonetta Rollene BRAVO, MD I connected with [name] at patients initiation/request on 01/14/2024 at 11:30 AM EDT by video enabled telemedicine application and verified that I am speaking with the correct person using two identifiers. Pt MRN:  [id number] Pt DOB:  Dec 07, 1980 Video Participants:  [name]   History of Present Illness:  The patient had a virtual video visit on 01/14/2024. 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 Since her last visit, she contacted our office about a seizure on 10/26/23. She was going to have a bowel movement when she bent over to wipe then woke up on the ground. She thinks she hit her head and may have had a seizure but is amnestic of event. She had a bruise and small abrasion on her forehead and went to the ER where Oxcarbazepine  level was 26. SHe also reported a lot of headaches. I had contacted Dr. Margette and we agreed on adding low dose Pregabalin  50mg  at bedtime to her regimen. She is also on Oxcarbazepine  600mg  BID (hyponatremia on higher doses), Briviact  100mg  BID, and Zonisamide  100mg  in AM, 500mg  in PM. She called our office about an incident on 12/27/23, she was in the shower and felt lightheaded. As she got out of the shower, she  started to fall and fell out of the tub onto the floor. She denies any loss of consciousness and recalls trying to catch herself with her right arm. She went to the ER where a right wrist fracture was found. She underwent right wrist ORIF on 6/16. Sodium level at that time was 131. She is not sure if she has had any seizures, she has had sores inside her mouth in the jaw areas, she is not sure if she bit her jaw or it is due to her inability to brush her teeth well due to right hand injury. She was finally able to brush teeth well last night and there was a whole lot of blood. She has called her dentist. No tongue bite or incontinence. She reports she is having less headaches with addition of Pregabalin , no side effects. She is also on Aimovig  and nortriptyline  50mg  at bedtime for headache prophylaxis. She has prn Ubrelvy  for migraine rescue. She has been told she needs to get a BP monitor. She does not drink water much. She reports she has not eaten in 3 days due to bugs in her apartment coming out each time she has food out.    History from Initial Assessment 11/14/2017: This is a 43 year old right-handed woman with a history of migraines, back pain, and seizures, presenting to establish care. Seizures started around 2010 when she started having nocturnal convulsions with tongue bite and urinary incontinence. She would wake up with her children telling her what happened. She would be slightly  confused but back to baseline quickly. She was on Keppra  and Lamictal  with no change in seizure frequency. At that time she was also drinking heavily, but even with decrease in alcohol intake, had not noticed any change in frequency. She was admitted to [facility] for 7 days in October 2012, typical episodes were not captured. EMU report unavailable for review, on discharge summary it was noted there were incidental left-sided sharp waves of which there may or ma not be a correlation to her spells. Keppra  stopped and  Lamictal  dose increased. She was seeing neurologist Dr. Milton, records unavailable for review, but it appears Fycompa  was added to Lamictal . She reported worsening of seizures, now occurring in wakefulness, and was admitted for another 7-day EMU stay at [facility] in January 2018, again typical events were not captured. Baseline EEG was abnormal with intermittent left anterior to midtemporal slow waves, sharply contoured bursts of theta slowing over the left temporal region, as well as intermittent right temporal theta and delta slowing. There were also frequent 3-4 Hz very brief intermediate duration fluctuating runs of rhythmic delta activities (LRDA) maximal at F7. Occasional very brief runs were also seen maximal at F8.There were occasional independent left temporal and right temporal sharp waves. At times left temporal discharges have a wide field. There are ER visits in April, May, October, November 2018 for seizures, medications listed included Vimpat , Fycompa , and Oxtellar. Her last ER visit was on 11/05/17, taking Oxtellar 1200mg  BID without side effects. She reports compliance to medication.   She brings a calendar of her seizures in 2019. She had 2 in January, 2 in February, 1 in March, 3 so far in April. The last seizure occurred on 11/11/17 out of sleep, she woke up with a lip bite. She has had a lot of her seizures occur while playing at the [facility]. Family has witnessed the seizures, describing staring/unresponsiveness with lip smacking, then convulsions with head turn to the right. She denies any prior warning symptoms, no post-ictal focal weakness. Her entire body feels sore. No clear seizure triggers, she denies any sleep deprivation or missed medications. She has significantly cut down on alcohol, drinking 1 beer on the weekend.    She denies any olfactory/gustatory hallucinations, deja vu, rising epigastric sensation, focal numbness/tingling/weakness, myoclonic jerks. She has migraines  after a seizure and had a prescription for Norco. She also has Zipsor and Robaxin  to take after seizures. She denies any dizziness, diplopia, dysarthria/dysphagia, neck/back pain, bowel/bladder dysfunction.    Epilepsy Risk Factors:  Her brother had seizures in childhood. She had a normal birth and early development.  There is no history of febrile convulsions, CNS infections such as meningitis/encephalitis, significant traumatic brain injury, neurosurgical procedures, or family history of seizures. She has a scar over the left temporal region after hitting her head on a window, she denies any loss of consciousness with this.    Prior ASMs: Lamictal , Keppra , Fycompa , Vimpat , Depakote , Cenobamate , higher doses of Oxcarbazepine  Prior migraine rescue medications: sumatriptan , rizatriptan , naratriptan , Migranal  nasal spray, Ubrelvy , Fioricet, Robaxin , flexeril , ibuprofen , naproxen , diclofenac .  Prior Migraine Preventative: emgality   Laboratory Data:  EEGs: Her EEG at [facility] had shown bilateral temporal slowing, as well as rhythmic delta at F7 and F8, independent left and right temporal sharp waves. MRI: MRI brain with and without contrast in 10/2008 was unremarkable with scatted white matter changes I personally reviewed MRI brain with and without contrast done 11/17/17 which did not show any acute changes, hippocampi symmetric with no abnormal  signal or enhancement seen. There were several white matter changes bilaterally with no abnormal enhancement.  Records from [facility] reviewed: She had a PET scan in 01/2020: no seizure focus identified  Video EEG (02/17/2020-02/23/2020) : She had one GTC with R-head deviation (appeared to be L-sided onset) and another episode with grunting and unclear shaking (pt off camera) with postictal agiatation, but no staring spells with oral and hand automatisms. Interictal EEG showed an abnormal EEG due to occasional left temporoparietal and parietal sharp  waves.  Neuropsychological evaluation in 12/2019: Abnormal but consistent with expectation given her overall intellectual functioning and limited educational attainment. She does have slightly better discrimination or recognition of visual versus verbal material. However, overall, there is not compelling evidence of lateralized dysfunction. Given her memory dysfunction, results suggest that she would be at a low risk of further memory impairment should she undergo temporal resection. There are no mood or psychiatric concerns of note and she has strong family support.    Oxcarbazepine  level 03/2020: 36 (ref 10-35) Zonisamide  level 03/2020: 24.2 (ref 10-40)    Current Outpatient Medications on File Prior to Visit  Medication Sig Dispense Refill   acyclovir  (ZOVIRAX ) 400 MG tablet Take 1 tablet (400 mg total) by mouth 2 (two) times daily. 120 tablet 6   aspirin  81 MG chewable tablet Chew 1 tablet (81 mg total) by mouth daily. 90 tablet 0   atorvastatin  (LIPITOR) 40 MG tablet Take 1 tablet (40 mg total) by mouth daily. 90 tablet 0   brivaracetam  (BRIVIACT ) 100 MG TABS tablet Take 1 tablet (100 mg total) by mouth 2 (two) times daily. 60 tablet 5   busPIRone  (BUSPAR ) 5 MG tablet TAKE 1 TABLET(5 MG) BY MOUTH TWICE DAILY 60 tablet 2   nortriptyline  (PAMELOR ) 25 MG capsule TAKE 2 CAPSULES BY MOUTH EVERY NIGHT 180 capsule 3   oxcarbazepine  (TRILEPTAL ) 600 MG tablet Take 1 tablet two times daily . 180 tablet 3   oxyCODONE -acetaminophen  (PERCOCET/ROXICET) 5-325 MG tablet Take 1 tablet by mouth every 6 (six) hours as needed for severe pain (pain score 7-10). 10 tablet 0   oxyCODONE -acetaminophen  (PERCOCET/ROXICET) 5-325 MG tablet Take 1 tablet by mouth every 4 (four) hours as needed for severe pain (pain score 7-10). 30 tablet 0   pregabalin  (LYRICA ) 50 MG capsule Take 1 capsule every night 90 capsule 3   tiZANidine  (ZANAFLEX ) 4 MG tablet Take 1 tablet (4 mg total) by mouth every 6 (six) hours as needed for  muscle spasms. 30 tablet 3   Ubrogepant  (UBRELVY ) 100 MG TABS Take 1 tablet as needed at onset of migraine. Do not take more than 3 a week 10 tablet 5   Vitamin D , Ergocalciferol , (DRISDOL ) 1.25 MG (50000 UNIT) CAPS capsule Take 1 capsule (50,000 Units total) by mouth every 7 (seven) days. 12 capsule 2   zonisamide  (ZONEGRAN ) 100 MG capsule TAKE ONE (1) CAPSULE BY MOUTH ONCE DAILY IN THE MORNING AND TAKE FIVE (5) CAPSULES IN THE EVENING 540 capsule 2   chlorhexidine  (HIBICLENS ) 4 % external liquid Apply 15 mLs (1 Application total) topically as directed for 30 doses. Use as directed daily for 5 days every other week for 6 weeks. 946 mL 1         Erenumab -aooe (AIMOVIG ) 140 MG/ML SOAJ Inject 140 mg into the skin every 30 (thirty) days. (Patient not taking: Reported on 01/02/2024) 1.12 mL 11   medroxyPROGESTERone  (DEPO-PROVERA ) 150 MG/ML injection ADMINISTER 1 ML(150 MG) IN THE MUSCLE EVERY 3 MONTHS (Patient not  taking: Reported on 01/02/2024) 1 mL 4   mupirocin  ointment (BACTROBAN ) 2 % Place 1 Application into the nose 2 (two) times daily for 66 doses. Use as directed 2 times daily for 5 days every other week for 6 weeks. 66 g 0   No current facility-administered medications on file prior to visit.     Observations/Objective:   Vitals:   01/14/24 0938  Weight: 190 lb (86.2 kg)  Height: 5' 2 (1.575 m)   GEN:  The patient appears stated age and is in NAD.  Neurological examination: Patient is awake, alert, oriented x 3. No aphasia or dysarthria. Intact fluency and comprehension. Remote and recent memory intact. Able to name and repeat. Cranial nerves: Extraocular movements intact with no nystagmus. No facial asymmetry. Motor: moves all extremities symmetrically, at least anti-gravity x 4. No incoordination on finger to nose testing. Gait: narrow-based and steady, able to tandem walk adequately. Negative Romberg test.    Assessment and Plan:   This is a 43 yo RH woman with a history of  migraines and intractable epilepsy s/p left temporal encephalocele resection 07/30/2022. Since her last visit, she has had 2 incidents called seizures, however they may be syncopal episodes with both occurring in the bathroom, most recently without loss of consciousness with presyncopal symptoms. We agreed to continue on current ASMs, refills sent for Oxcarbazepine  600mg  BID, Briviact  100mg  BID, Zonisamide  100mg  in AM, 500mg  in PM, and Pregabalin  50mg  at bedtime. Addition of Pregabalin  has helped with migraines. She is also on Aimovig  and nortriptyline  50mg  at bedtime for migraine prophylaxis. She is scheduled for follow-up at the [facility]psy clinic with Dr. Margette to discuss continued seizures post-surgery, which has been understandably frustrating for her. She was advised to start monitoring BP and increasing hydration (but finding balance to avoid worsening hyponatremia) and eating regular meals. She does not drive. Follow-up in 4-5 months, call for any changes.    Follow Up Instructions:   -I discussed the assessment and treatment plan with the patient. The patient was provided an opportunity to ask questions and all were answered. The patient agreed with the plan and demonstrated an understanding of the instructions.   The patient was advised to call back or seek an in-person evaluation if the symptoms worsen or if the condition fails to improve as anticipated.    Darice CHRISTELLA Shivers, MD

## 2024-01-14 NOTE — Patient Instructions (Signed)
 Good to see you.  Continue all your medications  2. Agree with starting to monitor blood pressure and increasing hydration  3. Follow-up with Dr. Margette at [facility] as scheduled  4. Follow-up in 4-5 months, call for any changes   Seizure Precautions: 1. If medication has been prescribed for you to prevent seizures, take it exactly as directed.  Do not stop taking the medicine without talking to your doctor first, even if you have not had a seizure in a long time.   2. Avoid activities in which a seizure would cause danger to yourself or to others.  Don't operate dangerous machinery, swim alone, or climb in high or dangerous places, such as on ladders, roofs, or girders.  Do not drive unless your doctor says you may.  3. If you have any warning that you may have a seizure, lay down in a safe place where you can't hurt yourself.    4.  No driving for 6 months from last seizure, as per [place]  state law.   Please refer to the following link on the Epilepsy Foundation of America's website for more information: http://www.epilepsyfoundation.org/answerplace/Social/driving/drivingu.cfm   5.  Maintain good sleep hygiene. Avoid alcohol.  6.  Notify your neurology if you are planning pregnancy or if you become pregnant.  7.  Contact your doctor if you have any problems that may be related to the medicine you are taking.  8.  Call 911 and bring the patient back to the ED if:        A.  The seizure lasts longer than 5 minutes.       B.  The patient doesn't awaken shortly after the seizure  C.  The patient has new problems such as difficulty seeing, speaking or moving  D.  The patient was injured during the seizure  E.  The patient has a temperature over 102 F (39C)  F.  The patient vomited and now is having trouble breathing

## 2024-01-15 DIAGNOSIS — G5601 Carpal tunnel syndrome, right upper limb: Secondary | ICD-10-CM | POA: Diagnosis not present

## 2024-01-15 DIAGNOSIS — M25531 Pain in right wrist: Secondary | ICD-10-CM | POA: Diagnosis not present

## 2024-01-19 ENCOUNTER — Other Ambulatory Visit (HOSPITAL_COMMUNITY): Payer: Self-pay

## 2024-01-19 ENCOUNTER — Other Ambulatory Visit: Payer: Self-pay | Admitting: Neurology

## 2024-01-20 ENCOUNTER — Telehealth: Payer: Self-pay

## 2024-01-20 ENCOUNTER — Telehealth: Payer: Self-pay | Admitting: Pharmacy Technician

## 2024-01-20 NOTE — Telephone Encounter (Signed)
 Pt needs a PA on her Aimovig 

## 2024-01-20 NOTE — Telephone Encounter (Signed)
 PA has been submitted, and telephone encounter has been created. Please see telephone encounter dated 7.1.25.

## 2024-01-20 NOTE — Telephone Encounter (Signed)
 Pharmacy Patient Advocate Encounter   Received notification from Pt Calls Messages that prior authorization for AIMOVIG  140MG  is required/requested.   Insurance verification completed.   The patient is insured through [facility] .   Per test claim: PA required; PA submitted to above mentioned insurance via CoverMyMeds Key/confirmation #/[id number] Status is pending

## 2024-01-27 DIAGNOSIS — G5601 Carpal tunnel syndrome, right upper limb: Secondary | ICD-10-CM | POA: Diagnosis not present

## 2024-02-04 ENCOUNTER — Telehealth: Payer: Self-pay | Admitting: Neurology

## 2024-02-04 MED ORDER — PREGABALIN 100 MG PO CAPS: ORAL_CAPSULE | ORAL | 5 refills | Status: DC

## 2024-02-04 NOTE — Telephone Encounter (Signed)
 Pls let her know I received Dr. Mardy note. I sent in a prescription for the higher dose, it will be a new prescription for Lyrica  (Pregabalin ) 100mg : Take 1 capsule every night. Thanks

## 2024-02-04 NOTE — Telephone Encounter (Signed)
 Spoke to pt--stated-went to [facility] seen Donnice Elsie Cecil, MD sent a note to Dr. Georjean regarding Rx Lyrica  changing 1 tab to 2 tab. Please advise

## 2024-02-04 NOTE — Telephone Encounter (Signed)
 Pt. Needs Blue and White pill last provided by aquino refilled (pt does not remember name of Rx) send with [facility] [place]

## 2024-02-04 NOTE — Telephone Encounter (Signed)
 Notified pt regarding Rx Pregabalin  100 mg 1 tab daily. Pt voiced understanding.

## 2024-02-09 ENCOUNTER — Telehealth: Payer: Self-pay

## 2024-02-09 NOTE — Telephone Encounter (Signed)
 Lvm to cb, need more information. Like where itching is, how long, other sx's

## 2024-02-09 NOTE — Telephone Encounter (Signed)
 Copied from CRM [id number]. Topic: Clinical - Medication Question >> Feb 09, 2024  9:35 AM Carlatta H wrote: Reason for CRM: Patient called requesting medication for itching//Patient could not state where itching was or if she had a rash//Please call to advise

## 2024-02-09 NOTE — Telephone Encounter (Signed)
 No availability with any provider until Thursday, scheduled

## 2024-02-10 ENCOUNTER — Telehealth: Payer: Self-pay

## 2024-02-10 NOTE — Telephone Encounter (Signed)
Pt informed

## 2024-02-10 NOTE — Telephone Encounter (Signed)
 Use the cream on the skin outside , do not insert into vagina Will fully addres art visit

## 2024-02-10 NOTE — Telephone Encounter (Signed)
 Copied from CRM [id number]. Topic: Clinical - Medication Question >> Feb 09, 2024  2:48 PM DeAngela L wrote: Reason for CRM: patient found a cream and would like to ask if this is the same thing for what she needed it for, cause this cream she found at home has a refill on it and would like to ask if this is the same and can she get a refill on this cream refill good till 05/07/24  2% ketoconazole   Pt num [phone] (M) ok to leave a detailed message

## 2024-02-11 NOTE — Telephone Encounter (Signed)
 Pharmacy Patient Advocate Encounter  Received notification from [employer] that Prior Authorization for Aimovig  140MG /ML auto-injectors has been APPROVED from 01-20-2024 to 07-21-2024   PA #/Case ID/Reference #: [id number]

## 2024-02-12 ENCOUNTER — Ambulatory Visit: Admitting: Family Medicine

## 2024-02-16 ENCOUNTER — Encounter: Payer: Self-pay | Admitting: Family Medicine

## 2024-02-18 ENCOUNTER — Ambulatory Visit: Payer: Self-pay

## 2024-02-18 NOTE — Telephone Encounter (Signed)
 FYI Only or Action Required?: FYI only for provider.  Patient was last seen in primary care on 12/18/2023 by Terry Wilhelmena Lloyd Hilario, FNP.  Called Nurse Triage reporting No chief complaint on file..  Interventions attempted: OTC medications: ketoconazole  cream.  Symptoms are: unchanged.  Triage Disposition: No disposition on file.  Patient/caregiver understands and will follow disposition?:   Copied from CRM [id number]. Topic: Clinical - Prescription Issue >> Feb 18, 2024  7:59 AM Rosaria BRAVO wrote: Reason for CRM: Pt has a medication issue, says the patient is still burning between legs. Ketoconazole  2% cream  Best contact: [phone] Reason for Disposition  [1] Vulvar itching AND [2] not improved > 3 days following Care Advice  Answer Assessment - Initial Assessment Questions This RN rescheduled missed appointment. Patient advised to go to [facility] if she would like to be seen sooner and that a message would be routed to PCP office.   1. SYMPTOM: What's the main symptom you're concerned about? (e.g., rash, itching, swelling, dryness)     Patient previously contacted office and pcp gave ok to use OTC ketoconazole  until appointment for external vaginal pruritus.  2. LOCATION:    External vaginal area  3. ONSET: When did the  vaginitis  start?     02/10/24  Protocols used: Vulvar Symptoms-A-AH

## 2024-02-18 NOTE — Telephone Encounter (Signed)
 Appt rescheduled, no showed appt last week

## 2024-02-23 ENCOUNTER — Ambulatory Visit
Admission: EM | Admit: 2024-02-23 | Discharge: 2024-02-23 | Disposition: A | Discharge status: Home / Self Care | Attending: Family Medicine | Admitting: Family Medicine

## 2024-02-23 DIAGNOSIS — R21 Rash and other nonspecific skin eruption: Secondary | ICD-10-CM | POA: Diagnosis not present

## 2024-02-23 DIAGNOSIS — K130 Diseases of lips: Secondary | ICD-10-CM | POA: Diagnosis not present

## 2024-02-23 MED ORDER — HYDROCORTISONE 0.5 % EX OINT: 1.0000 | TOPICAL_OINTMENT | Freq: Every day | CUTANEOUS | 0 refills | Status: DC | PRN

## 2024-02-23 MED ORDER — NYSTATIN 100000 UNIT/GM EX CREA: TOPICAL_CREAM | CUTANEOUS | 1 refills | Status: DC

## 2024-02-23 NOTE — Discharge Instructions (Signed)
 You may mix a small amount of the hydrocortisone  ointment with Vaseline or Aquaphor and apply to the lips once to twice daily to help with the irritation.  Avoid any scented facial lotions, soaps or Chapstick's.  You may apply the nystatin  cream to the groin area twice daily as needed until resolved.

## 2024-02-23 NOTE — ED Triage Notes (Signed)
 Pt states she has small bumps on the bottom of her lip x 1 month. Pt says her herpes medication was switched from famciclovir  to acyclovir .  And a red itchy rash on groin x 2-3 weeks.

## 2024-02-23 NOTE — ED Provider Notes (Signed)
 [facility]    CSN: [id number] Arrival date & time: 02/23/24  1713      History   Chief Complaint Chief Complaint  Patient presents with   Rash    HPI [name] is a 43 y.o. female.   Patient presenting today with small painless bumps to the bottom portion of the bottom lip for about the past month.  States they have been staying about the same since onset.  Denies itching, pain, new soaps or products used, spread elsewhere.  Trying Vaseline, Aquaphor, Chapstick with minimal relief.  Notes that her herpes antiviral with switch from famciclovir  to acyclovir  but otherwise no new changes.  Also notes a red itchy rash to bilateral groin folds and vulvar region for the past 2 to 3 weeks.  No appreciable discharge, pelvic or abdominal pain, concern for STIs or pregnancy.  So far not trying anything over-the-counter for the symptoms.    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 ([facility])   Vision loss, bilateral 05/08/2021    Patient Active Problem List   Diagnosis Date Noted   Concussion 10/31/2023   Encounter for examination following treatment at hospital 10/31/2023   Migraine headache 10/31/2023   Hyponatremia 10/30/2023   DOE (dyspnea on exertion) 09/29/2023   Hospital discharge follow-up 09/18/2023   TMJ tenderness, right 09/18/2023   Elevated troponin 08/29/2023   Acute hypoxemic respiratory failure (HCC) 08/29/2023   Acute respiratory failure with hypoxia (HCC) 08/29/2023   Wrist pain, acute, left 08/14/2023   Morbid obesity (HCC) 05/12/2023   Encounter for immunization 05/12/2023   Low sodium levels  05/12/2023   GAD (generalized anxiety disorder) 03/08/2023   Elevated alkaline phosphatase level 11/11/2022   Tinea cruris 09/18/2022   Onychomycosis 09/18/2022   Tinea pedis of both feet 09/18/2022   Constipation 08/12/2022   Encounter for initial prescription of injectable contraceptive 07/09/2022   Assistance needed with transportation 05/06/2022   Encounter for support and coordination of transition of care 01/12/2022   Forgetfulness 11/19/2021   Right shoulder pain 08/10/2021   Vision loss 05/08/2021   Post traumatic stress disorder (PTSD) 04/28/2020   Post-traumatic stress 04/27/2020   Depression, major, single episode, severe (HCC) 04/27/2020   PCR DNA positive for HSV2 07/25/2018   Localization-related (focal) (partial) idiopathic epilepsy and epileptic syndromes with seizures of localized onset, intractable, without status epilepticus (HCC) 11/17/2017   Seizure (HCC) 11/19/2015   Amenorrhea 09/28/2011   Vitamin D  deficiency 09/06/2011   Headache 03/10/2011   Convulsions (HCC) 10/26/2008   Cigarette smoker 12/10/2007   Depression 12/10/2007    Past Surgical History:  Procedure Laterality Date   BRAIN SURGERY  07/30/2022   CARPAL TUNNEL RELEASE Right 01/05/2024   Procedure: CARPAL TUNNEL RELEASE;  Surgeon: Yvone Rush, MD;  Location: [facility];  Service: Orthopedics;  Laterality: Right;   CESAREAN SECTION     x 3   ORIF WRIST FRACTURE Right 01/05/2024   Procedure: OPEN REDUCTION INTERNAL FIXATION (ORIF) WRIST FRACTURE;  Surgeon: Yvone Rush, MD;  Location: [facility];  Service: Orthopedics;  Laterality: Right;  OPEN REDUCTION INTERNAL FIXATION RIGHT WRIST AND CARPAL TUNNELL RELEASE    OB History     Gravida  3   Para  3   Term      Preterm      AB      Living  3      SAB      IAB      Ectopic      Multiple      Live Births  3            Home Medications    Prior to Admission medications   Medication Sig Start Date End Date Taking? Authorizing  Provider  acyclovir  (ZOVIRAX ) 400 MG tablet Take 1 tablet (400 mg total) by mouth 2 (two) times daily. 12/18/23  Yes Del Wilhelmena Falter, Hilario, FNP  hydrocortisone  ointment 0.5 % Apply 1 Application topically daily as needed for itching. 02/23/24  Yes Stuart Vernell Norris, PA-C  nystatin  cream (MYCOSTATIN ) Apply to affected area 2 times daily (groin area) 02/23/24  Yes Stuart Vernell Norris, PA-C  aspirin  81 MG chewable tablet Chew 1 tablet (81 mg total) by mouth daily. 09/06/23   Raenelle Coria, MD  atorvastatin  (LIPITOR) 40 MG tablet Take 1 tablet (40 mg total) by mouth daily. 12/25/23 03/24/24  Antonetta Rollene BRAVO, MD  brivaracetam  (BRIVIACT ) 100 MG TABS tablet Take 1 tablet (100 mg total) by mouth 2 (two) times daily. 01/14/24   Georjean Darice HERO, MD  busPIRone  (BUSPAR ) 5 MG tablet TAKE 1 TABLET(5 MG) BY MOUTH TWICE DAILY 01/12/24   Antonetta Rollene BRAVO, MD  chlorhexidine  (HIBICLENS ) 4 % external liquid Apply 15 mLs (1 Application total) topically as directed for 30 doses. Use as directed daily for 5 days every other week for 6 weeks. 01/05/24   Orlando Camellia POUR, PA-C  Erenumab -aooe (AIMOVIG ) 140 MG/ML SOAJ Inject 140 mg into the skin every 30 (thirty) days. 01/14/24   Georjean Darice HERO, MD  medroxyPROGESTERone  (DEPO-PROVERA ) 150 MG/ML injection ADMINISTER 1 ML(150 MG) IN THE MUSCLE EVERY 3 MONTHS Patient not taking: Reported on 01/02/2024 08/26/23   Signa Delon LABOR, NP  nortriptyline  (PAMELOR ) 25 MG capsule TAKE 2 CAPSULES BY MOUTH EVERY NIGHT 01/14/24   Georjean Darice HERO, MD  oxcarbazepine  (TRILEPTAL ) 600 MG tablet Take 1 tablet two times daily . 01/14/24   Georjean Darice HERO, MD  oxyCODONE -acetaminophen  (PERCOCET/ROXICET) 5-325 MG tablet Take 1 tablet by mouth every 6 (six) hours as needed for severe pain (pain score 7-10). 12/27/23   Cleotilde Rogue, MD  oxyCODONE -acetaminophen  (PERCOCET/ROXICET) 5-325 MG tablet Take 1 tablet by mouth every 4 (four) hours as needed for severe pain (pain score 7-10). 01/05/24   Orlando Camellia POUR, PA-C  pregabalin  (LYRICA ) 100 MG capsule Take 1 capsule every night 02/04/24   Georjean Darice HERO, MD  tiZANidine  (ZANAFLEX ) 4 MG tablet Take 1 tablet (4 mg total) by mouth every 6 (six) hours as needed for muscle spasms. 12/12/23   Georjean Darice HERO, MD  Ubrogepant  (UBRELVY ) 100 MG TABS Take 1 tablet as needed at onset of migraine. Do not take more than 3 a week 01/14/24   Georjean Darice HERO, MD  Vitamin D , Ergocalciferol , (DRISDOL ) 1.25 MG (50000 UNIT) CAPS capsule Take 1 capsule (50,000 Units total) by mouth every 7 (seven) days. 08/14/23   Antonetta Rollene BRAVO, MD  zonisamide  (ZONEGRAN ) 100 MG capsule TAKE ONE (1) CAPSULE BY MOUTH ONCE DAILY IN THE MORNING AND TAKE FIVE (5) CAPSULES IN THE EVENING 01/14/24  Georjean Darice HERO, MD    Family History Family History  Problem Relation Age of Onset   Hypertension Mother    Healthy Father     Social History Social History   Tobacco Use   Smoking status: Every Day    Current packs/day: 1.00    Average packs/day: 1 pack/day for 10.0 years (10.0 ttl pk-yrs)    Types: Cigarettes    Passive exposure: Current   Smokeless tobacco: Never   Tobacco comments:    Offered Smoking Cessation.  Vaping Use   Vaping status: Never Used  Substance Use Topics   Alcohol use: Not Currently    Alcohol/week: 2.0 standard drinks of alcohol    Types: 2 Cans of beer per week    Comment: last drink in December   Drug use: Yes    Types: Marijuana    Comment: everyday     Allergies   Morphine   Review of Systems Review of Systems Per HPI  Physical Exam Triage Vital Signs ED Triage Vitals  Encounter Vitals Group     BP 02/23/24 1730 134/85     Girls Systolic BP Percentile --      Girls Diastolic BP Percentile --      Boys Systolic BP Percentile --      Boys Diastolic BP Percentile --      Pulse Rate 02/23/24 1730 80     Resp 02/23/24 1730 16     Temp 02/23/24 1730 98.6 F (37 C)     Temp Source 02/23/24 1730 Oral     SpO2 02/23/24 1730 98 %      Weight --      Height --      Head Circumference --      Peak Flow --      Pain Score 02/23/24 1737 0     Pain Loc --      Pain Education --      Exclude from Growth Chart --    No data found.  Updated Vital Signs BP 134/85 (BP Location: Right Arm)   Pulse 80   Temp 98.6 F (37 C) (Oral)   Resp 16   LMP  (LMP Unknown)   SpO2 98%   Visual Acuity Right Eye Distance:   Left Eye Distance:   Bilateral Distance:    Right Eye Near:   Left Eye Near:    Bilateral Near:     Physical Exam Vitals and nursing note reviewed.  Constitutional:      Appearance: Normal appearance. She is not ill-appearing.  HENT:     Head: Atraumatic.     Mouth/Throat:     Comments: Small flesh-colored bumps to the vermilion border of the lower lip Eyes:     Extraocular Movements: Extraocular movements intact.     Conjunctiva/sclera: Conjunctivae normal.  Cardiovascular:     Rate and Rhythm: Normal rate and regular rhythm.     Heart sounds: Normal heart sounds.  Pulmonary:     Effort: Pulmonary effort is normal.     Breath sounds: Normal breath sounds.  Musculoskeletal:        General: Normal range of motion.     Cervical back: Normal range of motion and neck supple.  Skin:    General: Skin is warm and dry.     Findings: Rash present.     Comments: Erythematous slightly macerated rash to bilateral groin folds  Neurological:     Mental Status: She is alert and oriented to  person, place, and time.  Psychiatric:        Mood and Affect: Mood normal.        Thought Content: Thought content normal.        Judgment: Judgment normal.      UC Treatments / Results  Labs (all labs ordered are listed, but only abnormal results are displayed) Labs Reviewed - No data to display  EKG   Radiology No results found.  Procedures Procedures (including critical care time)  Medications Ordered in UC Medications - No data to display  Initial Impression / Assessment and Plan / UC Course  I have  reviewed the triage vital signs and the nursing notes.  Pertinent labs & imaging results that were available during my care of the patient were reviewed by me and considered in my medical decision making (see chart for details).     Bumps on lip appear to be irritation bumps, not consistent with herpes mumps or bacterial infection.  Discontinue any scented or irritating Chapstick's or soaps, facial products and mix a small amount of hydrocortisone  ointment and with Aquaphor or Vaseline to apply topically as needed.  Groin rash appears to be yeast related.  Treat with nystatin  rash, keep area clean and dry.  Return for worsening symptoms.  Final Clinical Impressions(s) / UC Diagnoses   Final diagnoses:  Lip lesion  Groin rash     Discharge Instructions      You may mix a small amount of the hydrocortisone  ointment with Vaseline or Aquaphor and apply to the lips once to twice daily to help with the irritation.  Avoid any scented facial lotions, soaps or Chapstick's.  You may apply the nystatin  cream to the groin area twice daily as needed until resolved.    ED Prescriptions     Medication Sig Dispense Auth. Provider   nystatin  cream (MYCOSTATIN ) Apply to affected area 2 times daily (groin area) 80 g Stuart Vernell Norris, PA-C   hydrocortisone  ointment 0.5 % Apply 1 Application topically daily as needed for itching. 30 g Stuart Vernell Norris, [place]      PDMP not reviewed this encounter.   Stuart Vernell Norris, [place] 02/23/24 [phone]

## 2024-02-27 ENCOUNTER — Ambulatory Visit: Payer: Self-pay | Admitting: Family Medicine

## 2024-03-08 ENCOUNTER — Telehealth: Payer: Self-pay | Admitting: Neurology

## 2024-03-08 NOTE — Telephone Encounter (Signed)
 Pt called and states that she is having rt leg pain and needs to know what she can take to help with the pain  please call

## 2024-03-08 NOTE — Telephone Encounter (Signed)
 No answer, patient needs to call PCP for that condition.

## 2024-03-09 ENCOUNTER — Encounter: Payer: 59 | Admitting: Family Medicine

## 2024-03-09 ENCOUNTER — Ambulatory Visit (INDEPENDENT_AMBULATORY_CARE_PROVIDER_SITE_OTHER): Admitting: Family Medicine

## 2024-03-09 ENCOUNTER — Telehealth: Payer: Self-pay | Admitting: Family Medicine

## 2024-03-09 ENCOUNTER — Encounter: Payer: Self-pay | Admitting: Family Medicine

## 2024-03-09 DIAGNOSIS — Z23 Encounter for immunization: Secondary | ICD-10-CM | POA: Diagnosis not present

## 2024-03-09 DIAGNOSIS — Z131 Encounter for screening for diabetes mellitus: Secondary | ICD-10-CM

## 2024-03-09 DIAGNOSIS — Z0001 Encounter for general adult medical examination with abnormal findings: Secondary | ICD-10-CM | POA: Insufficient documentation

## 2024-03-09 DIAGNOSIS — B356 Tinea cruris: Secondary | ICD-10-CM | POA: Diagnosis not present

## 2024-03-09 DIAGNOSIS — F322 Major depressive disorder, single episode, severe without psychotic features: Secondary | ICD-10-CM | POA: Diagnosis not present

## 2024-03-09 DIAGNOSIS — N912 Amenorrhea, unspecified: Secondary | ICD-10-CM

## 2024-03-09 DIAGNOSIS — F411 Generalized anxiety disorder: Secondary | ICD-10-CM | POA: Diagnosis not present

## 2024-03-09 MED ORDER — VITAMIN D (ERGOCALCIFEROL) 1.25 MG (50000 UNIT) PO CAPS: 50000.0000 [IU] | ORAL_CAPSULE | ORAL | 2 refills | Status: DC

## 2024-03-09 MED ORDER — BUSPIRONE HCL 7.5 MG PO TABS: 7.5000 mg | ORAL_TABLET | Freq: Three times a day (TID) | ORAL | 3 refills | Status: DC

## 2024-03-09 MED ORDER — TERBINAFINE HCL 250 MG PO TABS: 250.0000 mg | ORAL_TABLET | Freq: Every day | ORAL | 1 refills | Status: DC

## 2024-03-09 NOTE — Progress Notes (Signed)
 [name]     MRN: [id number]      DOB: 28-May-1981  Chief Complaint  Patient presents with   Annual Exam    cpe   Vaginal Itching    Pt complains of vaginal itching ongoing. Went to [facility] and received cream but pt has a hard time reaching down there to apply, she is asking for an oral treatment     HPI: Patient is in for annual physical exam. Immunization is reviewed , and  updated if needed.   PE: BP 113/76   Pulse 85   Resp 16   Ht 5' 1 (1.549 m)   Wt 196 lb (88.9 kg)   LMP  (LMP Unknown)   SpO2 95%   BMI 37.03 kg/m   Pleasant  female, alert and oriented x 3, in no cardio-pulmonary distress. Afebrile. HEENT No facial trauma or asymetry. Sinuses non tender.  Extra occullar muscles intact.. External ears normal, . Neck: supple, no adenopathy,JVD or thyromegaly.No bruits.  Chest: Clear to ascultation bilaterally.No crackles or wheezes. Non tender to palpation   Cardiovascular system; Heart sounds normal,  S1 and  S2 ,no S3.  No murmur, or thrill. Apical beat not displaced Peripheral pulses normal.  Abdomen: Soft, non tender, no organomegaly or masses. No bruits. Bowel sounds normal. No guarding, tenderness or rebound.   Musculoskeletal exam: Full ROM of spine, hips , shoulders and knees. No deformity ,swelling or crepitus noted. No muscle wasting or atrophy.   Neurologic: Cranial nerves 2 to 12 intact. Power, tone ,sensation and reflexes normal throughout. No disturbance in gait. No tremor.  Skin: Intact, no ulceration, erythema , scaling or rash noted. Pigmentation normal throughout  Psych; Normal mood and affect. Judgement and concentration normal   Assessment & Plan:  GAD (generalized anxiety disorder) Unconterolled inc dose of buspar  and refer to Psych, score of 17 in 02/2024  Encounter for immunization After obtaining informed consent, the HPV and Hep B #1 vaccines are   administered , with no adverse effect noted at the time of  administration.   Depression, major, single episode, severe (HCC) Refer Psych a lot of drug interaction and on multiple medications for seizure d/o  Tinea cruris Persitent symptoms will rx terbonafine tab  Encounter for Medicare annual examination with abnormal findings Annual exam as documented. Counseling done  re healthy lifestyle involving commitment to 150 minutes exercise per week, heart healthy diet, and attaining healthy weight.The importance of adequate sleep also discussed. Regular seat belt use and home safety, is also discussed. Changes in health habits are decided on by the patient with goals and time frames  set for achieving them. Immunization and cancer screening needs are specifically addressed at this visit.   Depression Score of 17 in 02/2024, refer Psych , complex neuro illness, multiple potential drug interactions  Morbid obesity (HCC)  Patient re-educated about  the importance of commitment to a  minimum of 150 minutes of exercise per week as able.  The importance of healthy food choices with portion control discussed, as well as eating regularly and within a 12 hour window most days. The need to choose clean , green food 50 to 75% of the time is discussed, as well as to make water the primary drink and set a goal of 64 ounces water daily.       03/09/2024    2:20 PM 01/14/2024    9:38 AM 01/05/2024    8:49 AM  Weight /BMI  Weight 196  lb 190 lb 190 lb  Height 5' 1 (1.549 m) 5' 2 (1.575 m) 5' 2 (1.575 m)  BMI 37.03 kg/m2 34.75 kg/m2 34.75 kg/m2    Worsening consider GLP1 therapy

## 2024-03-09 NOTE — Assessment & Plan Note (Signed)

## 2024-03-09 NOTE — Assessment & Plan Note (Signed)
  Patient re-educated about  the importance of commitment to a  minimum of 150 minutes of exercise per week as able.  The importance of healthy food choices with portion control discussed, as well as eating regularly and within a 12 hour window most days. The need to choose clean , green food 50 to 75% of the time is discussed, as well as to make water the primary drink and set a goal of 64 ounces water daily.       03/09/2024    2:20 PM 01/14/2024    9:38 AM 01/05/2024    8:49 AM  Weight /BMI  Weight 196 lb 190 lb 190 lb  Height 5' 1 (1.549 m) 5' 2 (1.575 m) 5' 2 (1.575 m)  BMI 37.03 kg/m2 34.75 kg/m2 34.75 kg/m2    Worsening consider GLP1 therapy

## 2024-03-09 NOTE — Assessment & Plan Note (Signed)
 After obtaining informed consent, the  HPV and Hep B #1 vaccines are  administered , with no adverse effect noted at the time of administration.

## 2024-03-09 NOTE — Patient Instructions (Signed)
 F/U in  mid January  HPV and Hep B#1 and appts in 2 and 6 months for the other vaccines  Pls schedule mammogram at checkout, past due  Labs today, FSH, LH, TSH, cmp and EGFR, cBC, hBA1C  You need to take weekly vit d as prescribed your level has been very low  Tablet , terbinafine  is prescribed for itchy rash in groin  Dose of anxiety med , buspar  is increased and you are referred to Psychiatry to treat depression and anxiety  Thanks for choosing [facility], we consider it a privelige to serve you.

## 2024-03-09 NOTE — Assessment & Plan Note (Addendum)
 Score of 17 in 02/2024, refer Psych , complex neuro illness, multiple potential drug interactions

## 2024-03-09 NOTE — Telephone Encounter (Signed)
 Noted

## 2024-03-09 NOTE — Telephone Encounter (Signed)
 I left message to contact PCP, if any thing else needed to call back to the office.

## 2024-03-09 NOTE — Assessment & Plan Note (Signed)
 Refer Psych a lot of drug interaction and on multiple medications for seizure d/o

## 2024-03-09 NOTE — Assessment & Plan Note (Signed)
 Persitent symptoms will rx terbonafine tab

## 2024-03-09 NOTE — Telephone Encounter (Signed)
 FYI, patient said she would call call to schedule her mammogram. Given the phone number

## 2024-03-09 NOTE — Assessment & Plan Note (Addendum)
 Unconterolled inc dose of buspar  and refer to Psych, score of 17 in 02/2024

## 2024-03-10 ENCOUNTER — Ambulatory Visit: Payer: Self-pay | Admitting: Family Medicine

## 2024-03-10 LAB — CMP14+EGFR
ALT: 15 IU/L (ref 0–32)
AST: 15 IU/L (ref 0–40)
Albumin: 3.9 g/dL (ref 3.9–4.9)
Alkaline Phosphatase: 172 IU/L — ABNORMAL HIGH (ref 44–121)
BUN/Creatinine Ratio: 7 — ABNORMAL LOW (ref 9–23)
BUN: 7 mg/dL (ref 6–24)
Bilirubin Total: <0.2 mg/dL (ref 0.0–1.2)
CO2: 19 mmol/L — ABNORMAL LOW (ref 20–29)
Calcium: 9.3 mg/dL (ref 8.7–10.2)
Chloride: 100 mmol/L (ref 96–106)
Creatinine, Ser: 0.97 mg/dL (ref 0.57–1.00)
Globulin, Total: 3.6 g/dL (ref 1.5–4.5)
Glucose: 91 mg/dL (ref 70–99)
Potassium: 4.7 mmol/L (ref 3.5–5.2)
Sodium: 133 mmol/L — ABNORMAL LOW (ref 134–144)
Total Protein: 7.5 g/dL (ref 6.0–8.5)
eGFR: 74 mL/min/1.73 (ref >59)

## 2024-03-10 LAB — CBC WITH DIFFERENTIAL/PLATELET
Basophils Absolute: 0 x10E3/uL (ref 0.0–0.2)
Basos: 1 %
EOS (ABSOLUTE): 0.1 x10E3/uL (ref 0.0–0.4)
Eos: 3 %
Hematocrit: 37.6 % (ref 34.0–46.6)
Hemoglobin: 11.4 g/dL (ref 11.1–15.9)
Immature Grans (Abs): 0 x10E3/uL (ref 0.0–0.1)
Immature Granulocytes: 0 %
Lymphocytes Absolute: 1.3 x10E3/uL (ref 0.7–3.1)
Lymphs: 31 %
MCH: 28.1 pg (ref 26.6–33.0)
MCHC: 30.3 g/dL — ABNORMAL LOW (ref 31.5–35.7)
MCV: 93 fL (ref 79–97)
Monocytes Absolute: 0.6 x10E3/uL (ref 0.1–0.9)
Monocytes: 13 %
Neutrophils Absolute: 2.1 x10E3/uL (ref 1.4–7.0)
Neutrophils: 52 %
Platelets: 433 x10E3/uL (ref 150–450)
RBC: 4.05 x10E6/uL (ref 3.77–5.28)
RDW: 16.3 % — ABNORMAL HIGH (ref 11.7–15.4)
WBC: 4.1 x10E3/uL (ref 3.4–10.8)

## 2024-03-10 LAB — HEMOGLOBIN A1C
Est. average glucose Bld gHb Est-mCnc: 117 mg/dL
Hgb A1c MFr Bld: 5.7 % — ABNORMAL HIGH (ref 4.8–5.6)

## 2024-03-10 LAB — TSH: TSH: 0.6 u[IU]/mL (ref 0.450–4.500)

## 2024-03-10 LAB — FSH/LH
FSH: 66.5 m[IU]/mL
LH: 62.8 m[IU]/mL

## 2024-03-10 MED ORDER — SEMAGLUTIDE-WEIGHT MANAGEMENT 0.25 MG/0.5ML ~~LOC~~ SOAJ: 0.2500 mg | SUBCUTANEOUS | 0 refills | Status: AC

## 2024-03-12 ENCOUNTER — Encounter: Payer: Self-pay | Admitting: Radiology

## 2024-03-16 ENCOUNTER — Telehealth: Payer: Self-pay | Admitting: Pharmacy Technician

## 2024-03-16 ENCOUNTER — Other Ambulatory Visit (HOSPITAL_COMMUNITY): Payer: Self-pay

## 2024-03-16 NOTE — Telephone Encounter (Signed)
 N/a, sent mychart mssg

## 2024-03-16 NOTE — Telephone Encounter (Signed)
 Pharmacy Patient Advocate Encounter   Received notification from CoverMyMeds that prior authorization for Wegovy  0.25MG /0.5ML auto-injectors is required/requested.   Insurance verification completed.   The patient is insured through [employer] .   Per test claim: Medication is excluded from coverage for indication of weight loss.

## 2024-03-18 ENCOUNTER — Other Ambulatory Visit: Payer: Self-pay | Admitting: Neurology

## 2024-04-20 ENCOUNTER — Telehealth: Payer: Self-pay | Admitting: Neurology

## 2024-04-20 NOTE — Telephone Encounter (Signed)
 Pt called in this morning and stated that her pharmacy needs a request from our office to increase the prescription called  pregabalin  (LYRICA ) 100 MG capsule 100mg . Pt stated she is getting the prescription, but it is coming in 50mg  instead of the 100mg  in the pack. She did it the prescription in the bottom for a 100 from [facility]

## 2024-04-22 MED ORDER — PREGABALIN 100 MG PO CAPS: ORAL_CAPSULE | ORAL | 3 refills | Status: DC

## 2024-04-22 NOTE — Telephone Encounter (Signed)
 Pls let her know I sent in the prescription for Pregabalin  100mg : take 1 capsule every night to [facility]. They may not fill it because she just filled the 50mg  prescription on 9/29. They may give it to her in 2 weeks, but I wrote that it is the updated prescription and to cancel the 50mg . Thanks

## 2024-04-22 NOTE — Telephone Encounter (Signed)
 LVM--to call the office back.

## 2024-04-22 NOTE — Telephone Encounter (Signed)
 SABRA

## 2024-04-23 NOTE — Telephone Encounter (Signed)
 Notified pt regarding medication and voiced understanding.

## 2024-05-07 ENCOUNTER — Ambulatory Visit

## 2024-05-07 DIAGNOSIS — Z23 Encounter for immunization: Secondary | ICD-10-CM | POA: Diagnosis not present

## 2024-05-07 MED ORDER — COVID-19 MRNA VACC (MODERNA) 50 MCG/0.5ML IM SUSY: 0.5000 mL | PREFILLED_SYRINGE | Freq: Once | INTRAMUSCULAR | 0 refills | Status: AC

## 2024-05-17 ENCOUNTER — Ambulatory Visit: Admitting: Neurology

## 2024-05-17 ENCOUNTER — Encounter: Payer: Self-pay | Admitting: Neurology

## 2024-05-24 ENCOUNTER — Encounter: Payer: Self-pay | Admitting: Radiology

## 2024-05-28 ENCOUNTER — Other Ambulatory Visit: Payer: Self-pay | Admitting: Family Medicine

## 2024-06-11 ENCOUNTER — Other Ambulatory Visit: Payer: Self-pay

## 2024-06-11 DIAGNOSIS — R32 Unspecified urinary incontinence: Secondary | ICD-10-CM

## 2024-06-11 MED ORDER — UNABLE TO FIND: 5 refills | Status: AC

## 2024-06-16 ENCOUNTER — Other Ambulatory Visit: Payer: Self-pay | Admitting: Neurology

## 2024-06-18 ENCOUNTER — Other Ambulatory Visit: Payer: Self-pay | Admitting: Neurology

## 2024-06-22 ENCOUNTER — Encounter (HOSPITAL_COMMUNITY): Payer: Self-pay | Admitting: Emergency Medicine

## 2024-06-22 ENCOUNTER — Other Ambulatory Visit: Payer: Self-pay | Admitting: Neurology

## 2024-06-22 ENCOUNTER — Emergency Department (HOSPITAL_COMMUNITY)
Admission: EM | Admit: 2024-06-22 | Discharge: 2024-06-23 | Disposition: A | Attending: Emergency Medicine | Admitting: Emergency Medicine

## 2024-06-22 ENCOUNTER — Emergency Department (HOSPITAL_COMMUNITY)

## 2024-06-22 DIAGNOSIS — Z79899 Other long term (current) drug therapy: Secondary | ICD-10-CM | POA: Diagnosis not present

## 2024-06-22 DIAGNOSIS — R519 Headache, unspecified: Secondary | ICD-10-CM | POA: Insufficient documentation

## 2024-06-22 DIAGNOSIS — Z7982 Long term (current) use of aspirin: Secondary | ICD-10-CM | POA: Insufficient documentation

## 2024-06-22 DIAGNOSIS — I10 Essential (primary) hypertension: Secondary | ICD-10-CM | POA: Insufficient documentation

## 2024-06-22 LAB — CBC WITH DIFFERENTIAL/PLATELET
Abs Immature Granulocytes: 0.02 K/uL (ref 0.00–0.07)
Basophils Absolute: 0 K/uL (ref 0.0–0.1)
Basophils Relative: 0 %
Eosinophils Absolute: 0.1 K/uL (ref 0.0–0.5)
Eosinophils Relative: 3 %
HCT: 36 % (ref 36.0–46.0)
Hemoglobin: 11.6 g/dL — ABNORMAL LOW (ref 12.0–15.0)
Immature Granulocytes: 0 %
Lymphocytes Relative: 35 %
Lymphs Abs: 1.7 K/uL (ref 0.7–4.0)
MCH: 28.6 pg (ref 26.0–34.0)
MCHC: 32.2 g/dL (ref 30.0–36.0)
MCV: 88.7 fL (ref 80.0–100.0)
Monocytes Absolute: 0.4 K/uL (ref 0.1–1.0)
Monocytes Relative: 9 %
Neutro Abs: 2.6 K/uL (ref 1.7–7.7)
Neutrophils Relative %: 53 %
Platelets: 451 K/uL — ABNORMAL HIGH (ref 150–400)
RBC: 4.06 MIL/uL (ref 3.87–5.11)
RDW: 17 % — ABNORMAL HIGH (ref 11.5–15.5)
WBC: 4.9 K/uL (ref 4.0–10.5)
nRBC: 0 % (ref 0.0–0.2)

## 2024-06-22 LAB — BASIC METABOLIC PANEL WITH GFR
Anion gap: 12 (ref 5–15)
BUN: <5 mg/dL — ABNORMAL LOW (ref 6–20)
CO2: 19 mmol/L — ABNORMAL LOW (ref 22–32)
Calcium: 9 mg/dL (ref 8.9–10.3)
Chloride: 98 mmol/L (ref 98–111)
Creatinine, Ser: 0.69 mg/dL (ref 0.44–1.00)
GFR, Estimated: >60 mL/min (ref >60)
Glucose, Bld: 75 mg/dL (ref 70–99)
Potassium: 3.7 mmol/L (ref 3.5–5.1)
Sodium: 128 mmol/L — ABNORMAL LOW (ref 135–145)

## 2024-06-22 LAB — MAGNESIUM: Magnesium: 2 mg/dL (ref 1.7–2.4)

## 2024-06-22 LAB — HCG, SERUM, QUALITATIVE: Preg, Serum: NEGATIVE

## 2024-06-22 MED ORDER — KETOROLAC TROMETHAMINE 15 MG/ML IJ SOLN
15.0000 mg | Freq: Once | INTRAMUSCULAR | Status: AC
  Administered 2024-06-22: 15 mg via INTRAVENOUS
  Filled 2024-06-22: qty 1

## 2024-06-22 MED ORDER — DIPHENHYDRAMINE HCL 50 MG/ML IJ SOLN
12.5000 mg | Freq: Once | INTRAMUSCULAR | Status: AC
  Administered 2024-06-22: 12.5 mg via INTRAVENOUS
  Filled 2024-06-22: qty 1

## 2024-06-22 MED ORDER — LACTATED RINGERS IV BOLUS
1000.0000 mL | Freq: Once | INTRAVENOUS | Status: AC
  Administered 2024-06-22: 1000 mL via INTRAVENOUS

## 2024-06-22 MED ORDER — METOCLOPRAMIDE HCL 5 MG/ML IJ SOLN
10.0000 mg | Freq: Once | INTRAMUSCULAR | Status: AC
  Administered 2024-06-22: 10 mg via INTRAVENOUS
  Filled 2024-06-22: qty 2

## 2024-06-22 MED ORDER — DROPERIDOL 2.5 MG/ML IJ SOLN
0.6250 mg | Freq: Once | INTRAMUSCULAR | Status: AC
  Administered 2024-06-22: 0.625 mg via INTRAVENOUS
  Filled 2024-06-22: qty 2

## 2024-06-22 NOTE — ED Provider Notes (Signed)
  Provider Note MRN:  [id number]  Arrival date & time: 06/23/24    ED Course and Medical Decision Making  Assumed care of patient at sign-out or upon transfer.  Migraine providing additional symptomatic management will reassess.  12:15 AM update: Patient feeling better and would like to go home.  Continued reassuring neurological exam, CT head unremarkable.  Mild hyponatremia discussed with the patient.  She will follow-up with PCP and neurology.  Procedures  Final Clinical Impressions(s) / ED Diagnoses     ICD-10-CM   1. Bad headache  R51.9       ED Discharge Orders     None         Discharge Instructions      You were evaluated in the Emergency Department and after careful evaluation, we did not find any emergent condition requiring admission or further testing in the hospital.  Your exam/testing today is overall reassuring.  Recommend follow-up with the neurologist to discuss your headaches, they can consider preventative medications.  Also recommend mildly increasing the salt in your diet to help with your sodium levels as we discussed.  Please return to the Emergency Department if you experience any worsening of your condition.   Thank you for allowing us  to be a part of your care.      Ozell HERO. [place]ad[place], MD [facility] Emergency Medicine [facility] [email or web address]    Theadore Ozell HERO, MD 06/23/24 [phone]

## 2024-06-22 NOTE — ED Triage Notes (Signed)
 Pt states she started to have a migraine while watching TV tonight. Pt has hx of seizures and said that her migraines usually start after a seizure however does not recall having a seizure.

## 2024-06-22 NOTE — ED Provider Notes (Signed)
 [facility] AT [facility] Provider Note   CSN: [id number] Arrival date & time: 06/22/24  2124     Patient presents with: Migraine   [name] is a 43 y.o. female.    Migraine Associated symptoms include headaches.  Patient presents for headache.  Medical history includes migraines, anxiety, depression, PTSD, seizures.  She follows with [facility].  She has had seizures for the past 30 years.  These have been focal in the past.  They have been identified on EEG.  She is prescribed Briviact , oxcarbazepine , and zonisamide .  She has been adherent to all of her home medications, including her seizure medicines.  She did get her evening doses tonight.  Earlier this evening, she had onset of a headache, bifrontal but greater on right side.  Headache is consistent with the prior migraines.  In the past, she has developed migraines after a seizure episode.  She is concerned that she may have had a seizure but does not remember having 1.  She has not taken any abortive therapies at home.     Prior to Admission medications   Medication Sig Start Date End Date Taking? Authorizing Provider  acyclovir  (ZOVIRAX ) 400 MG tablet Take 1 tablet (400 mg total) by mouth 2 (two) times daily. 12/18/23   Del Wilhelmena Lloyd Sola, FNP  aspirin  81 MG chewable tablet Chew 1 tablet (81 mg total) by mouth daily. 09/06/23   Raenelle Coria, MD  atorvastatin  (LIPITOR) 40 MG tablet TAKE 1 TABLET(40 MG) BY MOUTH DAILY 05/28/24   Antonetta Rollene BRAVO, MD  brivaracetam  (BRIVIACT ) 100 MG TABS tablet Take 1 tablet (100 mg total) by mouth 2 (two) times daily. 01/14/24   Georjean Darice CHRISTELLA, MD  busPIRone  (BUSPAR ) 7.5 MG tablet Take 1 tablet (7.5 mg total) by mouth 3 (three) times daily. 03/09/24   Antonetta Rollene BRAVO, MD  chlorhexidine  (HIBICLENS ) 4 % external liquid Apply 15 mLs (1 Application total) topically as directed for 30 doses. Use as directed daily for 5 days every other week for 6  weeks. 01/05/24   Orlando Camellia POUR, PA-C  Erenumab -aooe (AIMOVIG ) 140 MG/ML SOAJ Inject 140 mg into the skin every 30 (thirty) days. 01/14/24   Georjean Darice CHRISTELLA, MD  hydrocortisone  ointment 0.5 % Apply 1 Application topically daily as needed for itching. 02/23/24   Stuart Vernell Norris, PA-C  nortriptyline  (PAMELOR ) 25 MG capsule TAKE 2 CAPSULES BY MOUTH EVERY NIGHT 01/14/24   Georjean Darice CHRISTELLA, MD  nystatin  cream (MYCOSTATIN ) Apply to affected area 2 times daily (groin area) 02/23/24   Stuart Vernell Norris, PA-C  oxcarbazepine  (TRILEPTAL ) 600 MG tablet Take 1 tablet two times daily . 01/14/24   Georjean Darice CHRISTELLA, MD  pregabalin  (LYRICA ) 100 MG capsule Take 1 capsule every night 04/22/24   Georjean Darice CHRISTELLA, MD  semaglutide -weight management (WEGOVY ) 0.25 MG/0.5ML SOAJ SQ injection Inject 0.25 mg into the skin once a week. 03/10/24   Antonetta Rollene BRAVO, MD  terbinafine  (LAMISIL ) 250 MG tablet Take 1 tablet (250 mg total) by mouth daily. 03/09/24   Antonetta Rollene BRAVO, MD  tiZANidine  (ZANAFLEX ) 4 MG tablet TAKE 1 TABLET BY MOUTH EVERY 6 HOURS AS NEEDED FOR MUSCLE SPASMS 03/19/24   Georjean Darice CHRISTELLA, MD  Ubrogepant  (UBRELVY ) 100 MG TABS Take 1 tablet as needed at onset of migraine. Do not take more than 3 a week 01/14/24   Georjean Darice CHRISTELLA, MD  UNABLE TO FIND Med Name: Chux Pads 06/11/24   Antonetta,  Rollene BRAVO, MD  Vitamin D , Ergocalciferol , (DRISDOL ) 1.25 MG (50000 UNIT) CAPS capsule Take 1 capsule (50,000 Units total) by mouth every 7 (seven) days. 03/09/24   Antonetta Rollene BRAVO, MD  zonisamide  (ZONEGRAN ) 100 MG capsule TAKE ONE (1) CAPSULE BY MOUTH ONCE DAILY IN THE MORNING AND TAKE FIVE (5) CAPSULES IN THE EVENING 01/14/24   Georjean Darice HERO, MD    Allergies: Morphine    Review of Systems  Neurological:  Positive for headaches.  All other systems reviewed and are negative.   Updated Vital Signs BP (!) 156/88   Pulse 85   Temp 98 F (36.7 C) (Oral)   Resp 18   SpO2 100%   Physical Exam Vitals and nursing  note reviewed.  Constitutional:      General: She is not in acute distress.    Appearance: Normal appearance. She is well-developed. She is not ill-appearing, toxic-appearing or diaphoretic.  HENT:     Head: Normocephalic and atraumatic.     Right Ear: External ear normal.     Left Ear: External ear normal.     Nose: Nose normal.     Mouth/Throat:     Mouth: Mucous membranes are moist.  Eyes:     Extraocular Movements: Extraocular movements intact.     Conjunctiva/sclera: Conjunctivae normal.  Cardiovascular:     Rate and Rhythm: Normal rate and regular rhythm.  Pulmonary:     Effort: Pulmonary effort is normal. No respiratory distress.  Abdominal:     General: There is no distension.     Palpations: Abdomen is soft.  Musculoskeletal:        General: No swelling. Normal range of motion.     Cervical back: Normal range of motion and neck supple.  Skin:    General: Skin is warm and dry.     Coloration: Skin is not jaundiced or pale.  Neurological:     General: No focal deficit present.     Mental Status: She is alert and oriented to person, place, and time.     Cranial Nerves: No cranial nerve deficit.     Sensory: No sensory deficit.     Motor: No weakness.     Coordination: Coordination normal.  Psychiatric:        Mood and Affect: Mood normal.        Behavior: Behavior normal.     (all labs ordered are listed, but only abnormal results are displayed) Labs Reviewed  CBC WITH DIFFERENTIAL/PLATELET - Abnormal; Notable for the following components:      Result Value   Hemoglobin 11.6 (*)    RDW 17.0 (*)    Platelets 451 (*)    All other components within normal limits  HCG, SERUM, QUALITATIVE  BASIC METABOLIC PANEL WITH GFR  MAGNESIUM    EKG: None  Radiology: CT HEAD WO CONTRAST Result Date: 06/22/2024 EXAM: CT HEAD WITHOUT CONTRAST 06/22/2024 10:27:00 PM TECHNIQUE: CT of the head was performed without the administration of intravenous contrast. Automated  exposure control, iterative reconstruction, and/or weight based adjustment of the mA/kV was utilized to reduce the radiation dose to as low as reasonably achievable. COMPARISON: Comparison with 10/27/2023. CLINICAL HISTORY: Headache, increasing frequency or severity. FINDINGS: BRAIN AND VENTRICLES: No acute hemorrhage. No evidence of acute infarct. No hydrocephalus. No extra-axial collection. No mass effect or midline shift. Unchanged left temporal encephalomalacia. ORBITS: No acute abnormality. SINUSES: No acute abnormality. SOFT TISSUES AND SKULL: Left frontal craniotomy. No acute soft tissue abnormality. No skull  fracture. IMPRESSION: 1. No acute intracranial abnormality. 2. Unchanged left temporal encephalomalacia. Electronically signed by: Norman Gatlin MD 06/22/2024 10:30 PM EST RP Workstation: [id number]     Procedures   Medications Ordered in the ED  droperidol  (INAPSINE ) 2.5 MG/ML injection 0.625 mg (has no administration in time range)  ketorolac  (TORADOL ) 15 MG/ML injection 15 mg (15 mg Intravenous Given 06/22/24 2208)  metoCLOPramide  (REGLAN ) injection 10 mg (10 mg Intravenous Given 06/22/24 2207)  diphenhydrAMINE  (BENADRYL ) injection 12.5 mg (12.5 mg Intravenous Given 06/22/24 2207)  lactated ringers  bolus 1,000 mL (1,000 mLs Intravenous New Bag/Given 06/22/24 2206)                                    Medical Decision Making Amount and/or Complexity of Data Reviewed Labs: ordered. Radiology: ordered.  Risk Prescription drug management.   Patient presenting with headache, consistent with prior migraine headaches.  On arrival in the ED, vital signs notable for moderate hypertension.  On exam, patient is well-appearing.  She denies any symptoms other than the headache.  She has no focal neurologic deficits on exam.  She has been adherent to her home antiepileptic medications.  I agree cocktail was ordered.  Workup was initiated.  On reassessment, patient has had mild improvement in  her headache symptoms.  Dose of droperidol  was ordered.  Lab work and imaging pending at time of signout.  Care of patient was signed out to oncoming ED provider.     Final diagnoses:  Bad headache    ED Discharge Orders     None          Melvenia Motto, MD 06/22/24 2302

## 2024-06-23 ENCOUNTER — Telehealth: Payer: Self-pay | Admitting: Neurology

## 2024-06-23 NOTE — Telephone Encounter (Signed)
 [name])   PH:   [name] stated she went to the hospital around 9pm last night after taking seizure meds. She was asked by her son How was she doing  She stated that she was about to call the ambulance.    She stated that her head was hurting, bad migraine. Hospital gave her meds and was on the IV fluids. Stayed up there  until 12 or 1 am. She stated she had a headache when she left and this morning. She is wanting to know why.   FYI: She hung up.

## 2024-06-23 NOTE — Discharge Instructions (Signed)
 You were evaluated in the Emergency Department and after careful evaluation, we did not find any emergent condition requiring admission or further testing in the hospital.  Your exam/testing today is overall reassuring.  Recommend follow-up with the neurologist to discuss your headaches, they can consider preventative medications.  Also recommend mildly increasing the salt in your diet to help with your sodium levels as we discussed.  Please return to the Emergency Department if you experience any worsening of your condition.   Thank you for allowing us  to be a part of your care.

## 2024-06-24 NOTE — Telephone Encounter (Signed)
 How frequent or the headaches (on average, how many days a week/month are they occurring)?  Started yesterday  How long do the headaches last?  Couple of hours  Verify what preventative medication and dose you are taking (e.g. topiramate, propranolol, amitriptyline, Emgality , etc)  ainovig Verify which rescue medication you are taking (triptan, Advil , Excedrin, Aleve , Ubrelvy , etc)  Ubrogepant  (UBRELVY ) 100 MG TABS  said it didn't work  How often are you taking pain relievers/analgesics/rescue mediction?  no   Pt c/o: seizure Missed medications?  No. Sleep deprived?  No. Alcohol intake?  No. Increased stress? Yes.   Trigger? unsure Any change in medication color or shape? No. Drug use? Yes smoking Pot  Back to their usual baseline self?  Yes.  . If no, advise go to ER Current medications prescribed by Dr. Georjean:   brivaracetam  (BRIVIACT ) 100 MG TABS tablet 1 tablet (100 mg total) by mouth 2 (two) times daily  zonisamide  (ZONEGRAN ) 100 MG capsule TAKE ONE (1) CAPSULE BY MOUTH ONCE DAILY IN THE MORNING AND TAKE FIVE (5) CAPSULES IN THE EVENING,  oxcarbazepine  (TRILEPTAL ) 600 MG tablet Take 1 tablet two times daily   She stated that she had a seizure that caused her headache,

## 2024-06-30 ENCOUNTER — Telehealth: Payer: Self-pay | Admitting: Neurology

## 2024-06-30 NOTE — Telephone Encounter (Signed)
 Patient  called and needs us  to call the mailorder  pharmacy at [phone] to let them know that the Pregabalin  was increased from 50 mg to 100 mg

## 2024-07-02 MED ORDER — PREGABALIN 100 MG PO CAPS: ORAL_CAPSULE | ORAL | 3 refills | Status: DC

## 2024-07-02 NOTE — Telephone Encounter (Signed)
 I sent Rx for Pregabalin  100mg : Take 1 capsule every night to [facility]. Thanks

## 2024-07-16 ENCOUNTER — Telehealth: Payer: Self-pay | Admitting: Neurology

## 2024-07-16 MED ORDER — PREGABALIN 100 MG PO CAPS: ORAL_CAPSULE | ORAL | 3 refills | Status: AC

## 2024-07-16 NOTE — Telephone Encounter (Signed)
 Pls let her know I just sent it again, thanks

## 2024-07-16 NOTE — Telephone Encounter (Signed)
 Pt called in this morning and stated that  [facility] did not receive a refill for the Pregabalin  100mg  Thanks

## 2024-07-16 NOTE — Telephone Encounter (Signed)
 Pt called no answer left a voice mail to call the office back

## 2024-07-16 NOTE — Telephone Encounter (Signed)
 Pt called no answer left a voice mail that RX was sent in for her,

## 2024-07-16 NOTE — Telephone Encounter (Signed)
 Pt rtrnd [facility] call and wants to discuss Rx, I explained Rx sent to mail order today but Pt has questions about a different Rx not listed (Breviette)

## 2024-07-19 NOTE — Telephone Encounter (Signed)
 Pt called no answer left a voice mail to call the office back

## 2024-07-19 NOTE — Telephone Encounter (Signed)
 Pt called to find out what medication so was needing,

## 2024-07-19 NOTE — Telephone Encounter (Signed)
 Letter mailed to pt.

## 2024-07-28 ENCOUNTER — Ambulatory Visit: Payer: Self-pay

## 2024-07-28 NOTE — Telephone Encounter (Signed)
 FYI Only or Action Required?: Action required by provider: update on patient condition.  Patient was last seen in primary care on 03/09/2024 by Antonetta Rollene BRAVO, MD.  Called Nurse Triage reporting Otalgia.  Symptoms began today.  Interventions attempted: Nothing.  Symptoms are: unchanged.  Triage Disposition: Home Care  Patient/caregiver understands and will follow disposition?: Yes   Copied from CRM #[id number]. Topic: Clinical - Pink Word Triage >> Jul 28, 2024  1:52 PM Alfonso HERO wrote: Patient complaining of ear pain and thinks she has an ear infection.   Reason for Disposition  Earache lasts < 60 minutes  Answer Assessment - Initial Assessment Questions Returned call to pt to f/u on symptoms. Pt states she woke up with her R ear aching, no fever, no discharge, no fluid. Pt denies any recent travel or swimming. Pt has not tried any interventions. Based on no other symptoms, no distress, no fever. Encouraged home care of otc pain meds, warm compress to ear and avoiding water into ear. Reassured her I would update PCP. Pt voiced appreciation, pharmacy updated for any short term medications.     1. LOCATION: Which ear is involved?     R ear   2. ONSET: When did the ear pain start?      This morning   3. SEVERITY: How bad is the pain?  (Scale 1-10; mild, moderate or severe)     Mild  4. URI SYMPTOMS: Do you have a runny nose or cough?     No   5. FEVER: Do you have a fever? If Yes, ask: What is your temperature, how was it measured, and when did it start?     No   6. CAUSE: Have you been swimming recently?, How often do you use Q-TIPS?, Have you had any recent air travel or scuba diving?     Unsure; no trauma, no flights, no swimming   7. OTHER SYMPTOMS: Do you have any other symptoms? (e.g., decreased hearing, dizziness, headache, stiff neck, vomiting)     None  Protocols used: Earache-A-AH

## 2024-07-28 NOTE — Telephone Encounter (Signed)
 1st call back attempt. LVM to call office back.     Copied from CRM #[id number]. Topic: Clinical - Pink Word Triage >> Jul 28, 2024  1:52 PM Alfonso HERO wrote: Patient complaining of ear pain and thinks she has an ear infection.

## 2024-07-29 NOTE — Telephone Encounter (Signed)
 Appt scheduled

## 2024-07-30 ENCOUNTER — Telehealth (INDEPENDENT_AMBULATORY_CARE_PROVIDER_SITE_OTHER): Payer: Self-pay

## 2024-07-30 DIAGNOSIS — H6691 Otitis media, unspecified, right ear: Secondary | ICD-10-CM

## 2024-07-30 MED ORDER — NEOMYCIN-POLYMYXIN-HC 3.5-10000-1 OT SOLN: 3.0000 [drp] | Freq: Three times a day (TID) | OTIC | 0 refills | Status: AC

## 2024-07-30 NOTE — Progress Notes (Unsigned)
 "  Virtual Visit via Video Note  I connected with [name] on 07/30/2024 at 10:20 AM EST by a video enabled telemedicine application and verified that I am speaking with the correct person using two identifiers.  Patient Location: Home Provider Location: Office/Clinic  I discussed the limitations, risks, security, and privacy concerns of performing an evaluation and management service by video and the availability of in person appointments. I also discussed with the patient that there may be a patient responsible charge related to this service. The patient expressed understanding and agreed to proceed.  Subjective: PCP: Antonetta Rollene BRAVO, MD  Chief Complaint  Patient presents with   Medical Management of Chronic Issues    Right ear pain    HPI   ROS: Per HPI Current Medications[1]  Observations/Objective: There were no vitals filed for this visit. Physical Exam  Assessment and Plan: Right otitis media, unspecified otitis media type -     Neomycin -Polymyxin-HC; Place 3 drops into the right ear 3 (three) times daily for 7 days.  Dispense: 10 mL; Refill: 0    Follow Up Instructions: No follow-ups on file.   I discussed the assessment and treatment plan with the patient. The patient was provided an opportunity to ask questions, and all were answered. The patient agreed with the plan and demonstrated an understanding of the instructions.   The patient was advised to call back or seek an in-person evaluation if the symptoms worsen or if the condition fails to improve as anticipated.  The above assessment and management plan was discussed with the patient. The patient verbalized understanding of and has agreed to the management plan.   Leita Longs, FNP    [1]  Current Outpatient Medications:    acyclovir  (ZOVIRAX ) 400 MG tablet, Take 1 tablet (400 mg total) by mouth 2 (two) times daily., Disp: 120 tablet, Rfl: 6   aspirin  81 MG chewable tablet, Chew 1 tablet (81 mg  total) by mouth daily., Disp: 90 tablet, Rfl: 0   atorvastatin  (LIPITOR) 40 MG tablet, TAKE 1 TABLET(40 MG) BY MOUTH DAILY, Disp: 90 tablet, Rfl: 0   brivaracetam  (BRIVIACT ) 100 MG TABS tablet, Take 1 tablet (100 mg total) by mouth 2 (two) times daily., Disp: 60 tablet, Rfl: 5   busPIRone  (BUSPAR ) 7.5 MG tablet, Take 1 tablet (7.5 mg total) by mouth 3 (three) times daily., Disp: 90 tablet, Rfl: 3   chlorhexidine  (HIBICLENS ) 4 % external liquid, Apply 15 mLs (1 Application total) topically as directed for 30 doses. Use as directed daily for 5 days every other week for 6 weeks., Disp: 946 mL, Rfl: 1   Erenumab -aooe (AIMOVIG ) 140 MG/ML SOAJ, Inject 140 mg into the skin every 30 (thirty) days., Disp: 1.12 mL, Rfl: 11   hydrocortisone  ointment 0.5 %, Apply 1 Application topically daily as needed for itching., Disp: 30 g, Rfl: 0   neomycin -polymyxin-hydrocortisone  (CORTISPORIN) OTIC solution, Place 3 drops into the right ear 3 (three) times daily for 7 days., Disp: 10 mL, Rfl: 0   nortriptyline  (PAMELOR ) 25 MG capsule, TAKE 2 CAPSULES BY MOUTH EVERY NIGHT, Disp: 180 capsule, Rfl: 3   nystatin  cream (MYCOSTATIN ), Apply to affected area 2 times daily (groin area), Disp: 80 g, Rfl: 1   oxcarbazepine  (TRILEPTAL ) 600 MG tablet, Take 1 tablet two times daily ., Disp: 180 tablet, Rfl: 3   pregabalin  (LYRICA ) 100 MG capsule, Take 1 capsule every night, Disp: 90 capsule, Rfl: 3   semaglutide -weight management (WEGOVY ) 0.25 MG/0.5ML SOAJ SQ  injection, Inject 0.25 mg into the skin once a week., Disp: 2 mL, Rfl: 0   terbinafine  (LAMISIL ) 250 MG tablet, Take 1 tablet (250 mg total) by mouth daily., Disp: 14 tablet, Rfl: 1   tiZANidine  (ZANAFLEX ) 4 MG tablet, TAKE 1 TABLET BY MOUTH EVERY 6 HOURS AS NEEDED FOR MUSCLE SPASMS, Disp: 30 tablet, Rfl: 2   Ubrogepant  (UBRELVY ) 100 MG TABS, Take 1 tablet as needed at onset of migraine. Do not take more than 3 a week, Disp: 10 tablet, Rfl: 5   UNABLE TO FIND, Med Name: Chux  Pads, Disp: 30 each, Rfl: 5   Vitamin D , Ergocalciferol , (DRISDOL ) 1.25 MG (50000 UNIT) CAPS capsule, Take 1 capsule (50,000 Units total) by mouth every 7 (seven) days., Disp: 12 capsule, Rfl: 2   zonisamide  (ZONEGRAN ) 100 MG capsule, TAKE ONE (1) CAPSULE BY MOUTH ONCE DAILY IN THE MORNING AND TAKE FIVE (5) CAPSULES IN THE EVENING, Disp: 540 capsule, Rfl: 3  "

## 2024-08-02 ENCOUNTER — Ambulatory Visit: Payer: 59

## 2024-08-03 ENCOUNTER — Encounter: Payer: Self-pay | Admitting: Family Medicine

## 2024-08-03 ENCOUNTER — Other Ambulatory Visit (HOSPITAL_COMMUNITY): Payer: Self-pay | Admitting: Family Medicine

## 2024-08-03 ENCOUNTER — Ambulatory Visit: Admitting: Family Medicine

## 2024-08-03 VITALS — BP 154/90 | HR 76 | Resp 16 | Ht 62.0 in | Wt 200.8 lb

## 2024-08-03 DIAGNOSIS — Z1211 Encounter for screening for malignant neoplasm of colon: Secondary | ICD-10-CM | POA: Diagnosis not present

## 2024-08-03 DIAGNOSIS — F411 Generalized anxiety disorder: Secondary | ICD-10-CM

## 2024-08-03 DIAGNOSIS — R030 Elevated blood-pressure reading, without diagnosis of hypertension: Secondary | ICD-10-CM | POA: Diagnosis not present

## 2024-08-03 DIAGNOSIS — G40019 Localization-related (focal) (partial) idiopathic epilepsy and epileptic syndromes with seizures of localized onset, intractable, without status epilepticus: Secondary | ICD-10-CM

## 2024-08-03 DIAGNOSIS — F322 Major depressive disorder, single episode, severe without psychotic features: Secondary | ICD-10-CM | POA: Diagnosis not present

## 2024-08-03 DIAGNOSIS — H6691 Otitis media, unspecified, right ear: Secondary | ICD-10-CM

## 2024-08-03 DIAGNOSIS — F17210 Nicotine dependence, cigarettes, uncomplicated: Secondary | ICD-10-CM

## 2024-08-03 DIAGNOSIS — Z1231 Encounter for screening mammogram for malignant neoplasm of breast: Secondary | ICD-10-CM

## 2024-08-03 NOTE — Patient Instructions (Addendum)
 F/U in 4 months  Pls schedule mammogram at checkout  Nurse visit Feb 20 or after for HPV vaccine  You  will be referred for colonoscopy  Please get covid vaccine at your pharmacy  Pls give pt number for therapist  Collect med at pharmacy

## 2024-08-05 ENCOUNTER — Other Ambulatory Visit: Payer: Self-pay

## 2024-08-05 ENCOUNTER — Emergency Department (HOSPITAL_COMMUNITY)

## 2024-08-05 ENCOUNTER — Encounter (HOSPITAL_COMMUNITY): Payer: Self-pay

## 2024-08-05 ENCOUNTER — Emergency Department (HOSPITAL_COMMUNITY)
Admission: EM | Admit: 2024-08-05 | Discharge: 2024-08-05 | Disposition: A | Discharge status: Home / Self Care | Attending: Emergency Medicine | Admitting: Emergency Medicine

## 2024-08-05 DIAGNOSIS — J111 Influenza due to unidentified influenza virus with other respiratory manifestations: Secondary | ICD-10-CM

## 2024-08-05 DIAGNOSIS — Z7982 Long term (current) use of aspirin: Secondary | ICD-10-CM | POA: Diagnosis not present

## 2024-08-05 DIAGNOSIS — Z79899 Other long term (current) drug therapy: Secondary | ICD-10-CM | POA: Insufficient documentation

## 2024-08-05 DIAGNOSIS — R059 Cough, unspecified: Secondary | ICD-10-CM | POA: Diagnosis present

## 2024-08-05 MED ORDER — ALBUTEROL SULFATE HFA 108 (90 BASE) MCG/ACT IN AERS: 1.0000 | INHALATION_SPRAY | Freq: Four times a day (QID) | RESPIRATORY_TRACT | 0 refills | Status: DC | PRN

## 2024-08-05 MED ORDER — IPRATROPIUM-ALBUTEROL 0.5-2.5 (3) MG/3ML IN SOLN
3.0000 mL | Freq: Once | RESPIRATORY_TRACT | Status: AC
  Administered 2024-08-05: 3 mL via RESPIRATORY_TRACT
  Filled 2024-08-05: qty 3

## 2024-08-05 MED ORDER — PREDNISONE 10 MG PO TABS: 40.0000 mg | ORAL_TABLET | Freq: Every day | ORAL | 0 refills | Status: AC

## 2024-08-05 MED ORDER — PROMETHAZINE-DM 6.25-15 MG/5ML PO SYRP: 5.0000 mL | ORAL_SOLUTION | Freq: Four times a day (QID) | ORAL | 0 refills | Status: DC | PRN

## 2024-08-05 MED ORDER — OSELTAMIVIR PHOSPHATE 75 MG PO CAPS: 75.0000 mg | ORAL_CAPSULE | Freq: Two times a day (BID) | ORAL | 0 refills | Status: DC

## 2024-08-05 NOTE — ED Notes (Signed)
Written and verbal discharge instructions administered. Pt denies any questions or concerns at this time.

## 2024-08-05 NOTE — ED Provider Notes (Signed)
 " [place] EMERGENCY DEPARTMENT AT [facility] Provider Note   CSN: [id number] Arrival date & time: 08/05/24  1339     Patient presents with: Influenza   [name] is a 44 y.o. female.   Patient is a 44 year old female who presents emergency department the chief complaint of cough, congestion, generalized malaise and fatigue, fever which has been ongoing since this morning.  Patient has been exposed to her daughter and granddaughter who are both sick with the flu.  Patient has had no associate abdominal pain, nausea, vomiting, diarrhea.  She denies any chest pain or shortness of breath.  She has had no edema to her lower extremities.   Influenza Presenting symptoms: cough, fatigue, fever and rhinorrhea        Prior to Admission medications  Medication Sig Start Date End Date Taking? Authorizing Provider  albuterol  (VENTOLIN  HFA) 108 (90 Base) MCG/ACT inhaler Inhale 1-2 puffs into the lungs every 6 (six) hours as needed for wheezing or shortness of breath. 08/05/24  Yes Daralene Bruckner D, PA-C  oseltamivir  (TAMIFLU ) 75 MG capsule Take 1 capsule (75 mg total) by mouth every 12 (twelve) hours. 08/05/24  Yes Daralene Bruckner D, PA-C  predniSONE  (DELTASONE ) 10 MG tablet Take 4 tablets (40 mg total) by mouth daily for 5 days. 08/05/24 08/10/24 Yes Daralene Bruckner D, PA-C  promethazine -dextromethorphan (PROMETHAZINE -DM) 6.25-15 MG/5ML syrup Take 5 mLs by mouth 4 (four) times daily as needed. 08/05/24  Yes Daralene Bruckner BIRCH, PA-C  acyclovir  (ZOVIRAX ) 400 MG tablet Take 1 tablet (400 mg total) by mouth 2 (two) times daily. 12/18/23   Del Wilhelmena Lloyd Sola, FNP  aspirin  81 MG chewable tablet Chew 1 tablet (81 mg total) by mouth daily. 09/06/23   Raenelle Coria, MD  atorvastatin  (LIPITOR) 40 MG tablet TAKE 1 TABLET(40 MG) BY MOUTH DAILY 05/28/24   Antonetta Rollene BRAVO, MD  brivaracetam  (BRIVIACT ) 100 MG TABS tablet Take 1 tablet (100 mg total) by mouth 2 (two) times  daily. 01/14/24   Georjean Darice CHRISTELLA, MD  busPIRone  (BUSPAR ) 7.5 MG tablet Take 1 tablet (7.5 mg total) by mouth 3 (three) times daily. 03/09/24   Antonetta Rollene BRAVO, MD  chlorhexidine  (HIBICLENS ) 4 % external liquid Apply 15 mLs (1 Application total) topically as directed for 30 doses. Use as directed daily for 5 days every other week for 6 weeks. 01/05/24   Orlando Camellia POUR, PA-C  Erenumab -aooe (AIMOVIG ) 140 MG/ML SOAJ Inject 140 mg into the skin every 30 (thirty) days. 01/14/24   Georjean Darice CHRISTELLA, MD  hydrocortisone  ointment 0.5 % Apply 1 Application topically daily as needed for itching. 02/23/24   Stuart Vernell Norris, PA-C  neomycin -polymyxin-hydrocortisone  (CORTISPORIN) OTIC solution Place 3 drops into the right ear 3 (three) times daily for 7 days. 07/30/24 08/06/24  Bevely Doffing, FNP  nortriptyline  (PAMELOR ) 25 MG capsule TAKE 2 CAPSULES BY MOUTH EVERY NIGHT 01/14/24   Georjean Darice CHRISTELLA, MD  nystatin  cream (MYCOSTATIN ) Apply to affected area 2 times daily (groin area) 02/23/24   Stuart Vernell Norris, PA-C  oxcarbazepine  (TRILEPTAL ) 600 MG tablet Take 1 tablet two times daily . 01/14/24   Georjean Darice CHRISTELLA, MD  pregabalin  (LYRICA ) 100 MG capsule Take 1 capsule every night 07/16/24   Georjean Darice CHRISTELLA, MD  semaglutide -weight management (WEGOVY ) 0.25 MG/0.5ML SOAJ SQ injection Inject 0.25 mg into the skin once a week. 03/10/24   Antonetta Rollene BRAVO, MD  terbinafine  (LAMISIL ) 250 MG tablet Take 1 tablet (250 mg total) by mouth  daily. 03/09/24   Antonetta Rollene BRAVO, MD  tiZANidine  (ZANAFLEX ) 4 MG tablet TAKE 1 TABLET BY MOUTH EVERY 6 HOURS AS NEEDED FOR MUSCLE SPASMS 03/19/24   Georjean Darice HERO, MD  Ubrogepant  (UBRELVY ) 100 MG TABS Take 1 tablet as needed at onset of migraine. Do not take more than 3 a week 01/14/24   Georjean Darice HERO, MD  UNABLE TO FIND Med Name: Chux Pads 06/11/24   Antonetta Rollene BRAVO, MD  Vitamin D , Ergocalciferol , (DRISDOL ) 1.25 MG (50000 UNIT) CAPS capsule Take 1 capsule (50,000 Units total) by  mouth every 7 (seven) days. 03/09/24   Antonetta Rollene BRAVO, MD  zonisamide  (ZONEGRAN ) 100 MG capsule TAKE ONE (1) CAPSULE BY MOUTH ONCE DAILY IN THE MORNING AND TAKE FIVE (5) CAPSULES IN THE EVENING 01/14/24   Georjean Darice HERO, MD    Allergies: Morphine    Review of Systems  Constitutional:  Positive for fatigue and fever.  HENT:  Positive for rhinorrhea.   Respiratory:  Positive for cough.   All other systems reviewed and are negative.   Updated Vital Signs BP 135/82 (BP Location: Right Arm)   Pulse 84   Temp 98.9 F (37.2 C) (Oral)   Resp 18   Wt 90.7 kg   SpO2 100%   BMI 36.58 kg/m   Physical Exam Vitals and nursing note reviewed.  Constitutional:      General: She is not in acute distress.    Appearance: Normal appearance. She is not ill-appearing.  HENT:     Head: Normocephalic and atraumatic.     Nose: Nose normal.     Mouth/Throat:     Mouth: Mucous membranes are moist.  Eyes:     Extraocular Movements: Extraocular movements intact.     Conjunctiva/sclera: Conjunctivae normal.     Pupils: Pupils are equal, round, and reactive to light.  Cardiovascular:     Rate and Rhythm: Normal rate and regular rhythm.     Pulses: Normal pulses.     Heart sounds: Normal heart sounds. No murmur heard.    No gallop.  Pulmonary:     Effort: Pulmonary effort is normal. No respiratory distress.     Breath sounds: No stridor. Wheezing present. No rhonchi or rales.  Abdominal:     General: Abdomen is flat. Bowel sounds are normal. There is no distension.     Palpations: Abdomen is soft.     Tenderness: There is no abdominal tenderness. There is no guarding.  Musculoskeletal:        General: Normal range of motion.     Cervical back: Normal range of motion and neck supple. No rigidity or tenderness.     Right lower leg: No edema.     Left lower leg: No edema.  Skin:    General: Skin is warm and dry.  Neurological:     General: No focal deficit present.     Mental Status: She  is alert and oriented to person, place, and time. Mental status is at baseline.  Psychiatric:        Mood and Affect: Mood normal.        Behavior: Behavior normal.        Thought Content: Thought content normal.        Judgment: Judgment normal.     (all labs ordered are listed, but only abnormal results are displayed) Labs Reviewed - No data to display  EKG: None  Radiology: [facility] Chest Port 1 View Result Date: 08/05/2024 CLINICAL  DATA:  Cough, fever EXAM: PORTABLE CHEST 1 VIEW COMPARISON:  09/02/2023 FINDINGS: Single frontal view of the chest demonstrates a stable cardiac silhouette. No acute airspace disease, effusion, or pneumothorax. No acute bony abnormalities. IMPRESSION: 1. No acute intrathoracic process. Electronically Signed   By: Ozell Daring M.D.   On: 08/05/2024 15:16     Procedures   Medications Ordered in the ED  ipratropium-albuterol  (DUONEB) 0.5-2.5 (3) MG/3ML nebulizer solution 3 mL (3 mLs Nebulization Given 08/05/24 1502)                                    Medical Decision Making Patient is doing well at this time and is stable for discharge home.  Discussed with patient send her most consistent with influenza given her exposures.  Chest x-ray was unremarkable.  Will treat patient with Tamiflu  as well as steroids.  Suspect underlying COPD as well.  Do not suspect antibiotics are warranted at this time.  Vital signs are otherwise stable with no indication for sepsis and no associated hypoxia.  Close follow-up with primary care doctor was discussed as well as strict turn precautions for any new or worsening symptoms.  Patient voiced understanding and had no additional questions.  Amount and/or Complexity of Data Reviewed Radiology: ordered.  Risk Prescription drug management.        Final diagnoses:  Influenza-like illness    ED Discharge Orders          Ordered    oseltamivir  (TAMIFLU ) 75 MG capsule  Every 12 hours        08/05/24 1557     predniSONE  (DELTASONE ) 10 MG tablet  Daily        08/05/24 1557    albuterol  (VENTOLIN  HFA) 108 (90 Base) MCG/ACT inhaler  Every 6 hours PRN        08/05/24 1557    promethazine -dextromethorphan (PROMETHAZINE -DM) 6.25-15 MG/5ML syrup  4 times daily PRN        08/05/24 1557               Daralene Lonni BIRCH, PA-C 08/05/24 1559    Freddi Hamilton, MD 08/10/24 [phone]  "

## 2024-08-05 NOTE — Discharge Instructions (Signed)
 Please take all medications as directed.  Follow-up closely with your primary care doctor on an outpatient basis.  Return to emergency department immediately for any new or worsening symptoms.  Please refrain from smoking.

## 2024-08-05 NOTE — ED Triage Notes (Signed)
 Pt reports her daughter and granddaughter have the flu and she has a cough.

## 2024-08-06 ENCOUNTER — Telehealth: Payer: Self-pay

## 2024-08-06 NOTE — Telephone Encounter (Signed)
 Patient called the after hour service needing to speak to someone about FLU medication

## 2024-08-06 NOTE — Telephone Encounter (Signed)
 Spoke to patient, she had a seizure last night. She used the bathroom and then woke up on floor with pants down, pain over the right eyebrow. No tongue bite. She spoke to the pharmacy and was told to stop the Tamiflu . Discussed that flu in itself can lower seizure threshold, however she can hold off on Tamiflu  at this point and continue cough medicine and steroids. Continue current seizure medications and monitor for any seizure recurrence. She is back to baseline.

## 2024-08-06 NOTE — Telephone Encounter (Signed)
 Pt left message with the after hour service on 08-06-24 at 12:25 pm  Caller states that she needs to speak to someone about the flu medication. She believed it may cause her to have a seizure. She called [facility] and he advise her that the medication should have never been given and advise her to not take the FLU medication, but to keep taking the other two. Please call

## 2024-08-06 NOTE — Telephone Encounter (Signed)
 Patient called in to follow up on this and said the medication she took for the flu was Oseltamivir  75mg , She said her daughter looked it up and said that seizures showed as a side effect for this medication and wanted to know what she should do. Please advise, call back is [phone]

## 2024-08-06 NOTE — Telephone Encounter (Signed)
 Pt c/o: seizure Missed medications?  No. Sleep deprived?  No. Alcohol intake?  No. Increased stress? Yes.   Any change in medication color or shape? No. Back to their usual baseline self?  Yes.  . If no, advise go to ER Current medications prescribed by Dr. Georjean: zonisamide  (ZONEGRAN ) 100 MG capsule  brivaracetam  (BRIVIACT ) 100 MG TABS tablet  oxcarbazepine  (TRILEPTAL ) 600 MG tablet   brivaracetam  (BRIVIACT ) 100 MG TABS tablet   She has the Flu, she has a seizure last light she was in the bathroom last night with her pants down,

## 2024-08-11 ENCOUNTER — Other Ambulatory Visit: Payer: Self-pay | Admitting: Neurology

## 2024-08-11 ENCOUNTER — Telehealth: Payer: Self-pay | Admitting: Neurology

## 2024-08-11 MED ORDER — BRIVARACETAM 100 MG PO TABS: 100.0000 mg | ORAL_TABLET | Freq: Two times a day (BID) | ORAL | 5 refills | Status: AC

## 2024-08-11 NOTE — Telephone Encounter (Signed)
 Pls let her know she still needs to be on this Rx and refills sent to [facility], thanks

## 2024-08-11 NOTE — Telephone Encounter (Signed)
 Pt called in this morning and she stated that she got notice that the prescription called: brivaracetam  (BRIVIACT ) 100 MG TABS tablet  has been denied. Pt wants to know what needs to be done to get this prescription approved. Pt also wanted to know will she still be on this prescription. Thanks

## 2024-08-12 NOTE — Telephone Encounter (Signed)
 sent

## 2024-08-13 ENCOUNTER — Emergency Department (HOSPITAL_COMMUNITY)

## 2024-08-13 ENCOUNTER — Inpatient Hospital Stay (HOSPITAL_COMMUNITY)
Admission: EM | Admit: 2024-08-13 | Discharge: 2024-08-18 | DRG: 871 | Disposition: A | Discharge status: Home / Self Care | Attending: Family Medicine | Admitting: Family Medicine

## 2024-08-13 ENCOUNTER — Other Ambulatory Visit: Payer: Self-pay

## 2024-08-13 ENCOUNTER — Encounter (HOSPITAL_COMMUNITY): Payer: Self-pay

## 2024-08-13 ENCOUNTER — Telehealth: Payer: Self-pay

## 2024-08-13 DIAGNOSIS — E871 Hypo-osmolality and hyponatremia: Secondary | ICD-10-CM | POA: Diagnosis present

## 2024-08-13 DIAGNOSIS — E66812 Obesity, class 2: Secondary | ICD-10-CM | POA: Diagnosis present

## 2024-08-13 DIAGNOSIS — Z79899 Other long term (current) drug therapy: Secondary | ICD-10-CM | POA: Diagnosis not present

## 2024-08-13 DIAGNOSIS — F32A Depression, unspecified: Secondary | ICD-10-CM | POA: Diagnosis present

## 2024-08-13 DIAGNOSIS — R6520 Severe sepsis without septic shock: Secondary | ICD-10-CM | POA: Diagnosis present

## 2024-08-13 DIAGNOSIS — F411 Generalized anxiety disorder: Secondary | ICD-10-CM | POA: Diagnosis present

## 2024-08-13 DIAGNOSIS — G43909 Migraine, unspecified, not intractable, without status migrainosus: Secondary | ICD-10-CM | POA: Diagnosis present

## 2024-08-13 DIAGNOSIS — Z7985 Long-term (current) use of injectable non-insulin antidiabetic drugs: Secondary | ICD-10-CM

## 2024-08-13 DIAGNOSIS — J188 Other pneumonia, unspecified organism: Principal | ICD-10-CM

## 2024-08-13 DIAGNOSIS — F17210 Nicotine dependence, cigarettes, uncomplicated: Secondary | ICD-10-CM | POA: Diagnosis present

## 2024-08-13 DIAGNOSIS — G40909 Epilepsy, unspecified, not intractable, without status epilepticus: Secondary | ICD-10-CM | POA: Diagnosis present

## 2024-08-13 DIAGNOSIS — Z7982 Long term (current) use of aspirin: Secondary | ICD-10-CM | POA: Diagnosis not present

## 2024-08-13 DIAGNOSIS — Z8249 Family history of ischemic heart disease and other diseases of the circulatory system: Secondary | ICD-10-CM | POA: Diagnosis not present

## 2024-08-13 DIAGNOSIS — J189 Pneumonia, unspecified organism: Secondary | ICD-10-CM | POA: Diagnosis present

## 2024-08-13 DIAGNOSIS — E876 Hypokalemia: Secondary | ICD-10-CM | POA: Diagnosis present

## 2024-08-13 DIAGNOSIS — Z885 Allergy status to narcotic agent status: Secondary | ICD-10-CM | POA: Diagnosis not present

## 2024-08-13 DIAGNOSIS — E8729 Other acidosis: Secondary | ICD-10-CM | POA: Diagnosis present

## 2024-08-13 DIAGNOSIS — E872 Acidosis, unspecified: Secondary | ICD-10-CM | POA: Diagnosis present

## 2024-08-13 DIAGNOSIS — A419 Sepsis, unspecified organism: Principal | ICD-10-CM | POA: Diagnosis present

## 2024-08-13 DIAGNOSIS — R569 Unspecified convulsions: Secondary | ICD-10-CM

## 2024-08-13 DIAGNOSIS — Z6836 Body mass index (BMI) 36.0-36.9, adult: Secondary | ICD-10-CM | POA: Diagnosis not present

## 2024-08-13 DIAGNOSIS — J9601 Acute respiratory failure with hypoxia: Secondary | ICD-10-CM | POA: Diagnosis present

## 2024-08-13 DIAGNOSIS — R0602 Shortness of breath: Secondary | ICD-10-CM | POA: Diagnosis present

## 2024-08-13 LAB — CBC WITH DIFFERENTIAL/PLATELET
Abs Immature Granulocytes: 0.07 K/uL (ref 0.00–0.07)
Basophils Absolute: 0 K/uL (ref 0.0–0.1)
Basophils Relative: 0 %
Eosinophils Absolute: 0 K/uL (ref 0.0–0.5)
Eosinophils Relative: 0 %
HCT: 35.7 % — ABNORMAL LOW (ref 36.0–46.0)
Hemoglobin: 11.3 g/dL — ABNORMAL LOW (ref 12.0–15.0)
Immature Granulocytes: 1 %
Lymphocytes Relative: 7 %
Lymphs Abs: 0.9 K/uL (ref 0.7–4.0)
MCH: 27.9 pg (ref 26.0–34.0)
MCHC: 31.7 g/dL (ref 30.0–36.0)
MCV: 88.1 fL (ref 80.0–100.0)
Monocytes Absolute: 0.9 K/uL (ref 0.1–1.0)
Monocytes Relative: 7 %
Neutro Abs: 10.8 K/uL — ABNORMAL HIGH (ref 1.7–7.7)
Neutrophils Relative %: 85 %
Platelets: 470 K/uL — ABNORMAL HIGH (ref 150–400)
RBC: 4.05 MIL/uL (ref 3.87–5.11)
RDW: 16.4 % — ABNORMAL HIGH (ref 11.5–15.5)
WBC: 12.7 K/uL — ABNORMAL HIGH (ref 4.0–10.5)
nRBC: 0 % (ref 0.0–0.2)

## 2024-08-13 LAB — D-DIMER, QUANTITATIVE: D-Dimer, Quant: 1.78 ug{FEU}/mL — ABNORMAL HIGH (ref 0.00–0.50)

## 2024-08-13 LAB — LACTIC ACID, PLASMA
Lactic Acid, Venous: 2.6 mmol/L (ref 0.5–1.9)
Lactic Acid, Venous: 1.2 mmol/L (ref 0.5–1.9)

## 2024-08-13 LAB — COMPREHENSIVE METABOLIC PANEL WITH GFR
ALT: 12 U/L (ref 0–44)
AST: 17 U/L (ref 15–41)
Albumin: 3.9 g/dL (ref 3.5–5.0)
Alkaline Phosphatase: 158 U/L — ABNORMAL HIGH (ref 38–126)
Anion gap: 16 — ABNORMAL HIGH (ref 5–15)
BUN: <5 mg/dL — ABNORMAL LOW (ref 6–20)
CO2: 19 mmol/L — ABNORMAL LOW (ref 22–32)
Calcium: 8.8 mg/dL — ABNORMAL LOW (ref 8.9–10.3)
Chloride: 98 mmol/L (ref 98–111)
Creatinine, Ser: 0.88 mg/dL (ref 0.44–1.00)
GFR, Estimated: >60 mL/min
Glucose, Bld: 93 mg/dL (ref 70–99)
Potassium: 3.4 mmol/L — ABNORMAL LOW (ref 3.5–5.1)
Sodium: 133 mmol/L — ABNORMAL LOW (ref 135–145)
Total Bilirubin: 0.5 mg/dL (ref 0.0–1.2)
Total Protein: 8.1 g/dL (ref 6.5–8.1)

## 2024-08-13 LAB — PRO BRAIN NATRIURETIC PEPTIDE: Pro Brain Natriuretic Peptide: 230 pg/mL

## 2024-08-13 LAB — TROPONIN T, HIGH SENSITIVITY
Troponin T High Sensitivity: <6 ng/L (ref 0–19)
Troponin T High Sensitivity: <6 ng/L (ref 0–19)

## 2024-08-13 LAB — PROCALCITONIN: Procalcitonin: 0.2 ng/mL

## 2024-08-13 MED ORDER — SENNOSIDES-DOCUSATE SODIUM 8.6-50 MG PO TABS: 1.0000 | ORAL_TABLET | Freq: Every evening | ORAL | Status: DC | PRN

## 2024-08-13 MED ORDER — ENOXAPARIN SODIUM 40 MG/0.4ML IJ SOSY
40.0000 mg | PREFILLED_SYRINGE | INTRAMUSCULAR | Status: DC
  Administered 2024-08-13 – 2024-08-17 (×5): 40 mg via SUBCUTANEOUS
  Filled 2024-08-13 (×5): qty 0.4

## 2024-08-13 MED ORDER — GUAIFENESIN ER 600 MG PO TB12
600.0000 mg | ORAL_TABLET | Freq: Two times a day (BID) | ORAL | Status: DC
  Administered 2024-08-13 – 2024-08-18 (×11): 600 mg via ORAL
  Filled 2024-08-13 (×10): qty 1

## 2024-08-13 MED ORDER — NORTRIPTYLINE HCL 25 MG PO CAPS
50.0000 mg | ORAL_CAPSULE | Freq: Every day | ORAL | Status: DC
  Administered 2024-08-13 – 2024-08-17 (×5): 50 mg via ORAL
  Filled 2024-08-13 (×5): qty 2

## 2024-08-13 MED ORDER — IPRATROPIUM-ALBUTEROL 0.5-2.5 (3) MG/3ML IN SOLN
3.0000 mL | Freq: Once | RESPIRATORY_TRACT | Status: AC
  Administered 2024-08-13: 3 mL via RESPIRATORY_TRACT
  Filled 2024-08-13: qty 3

## 2024-08-13 MED ORDER — BUSPIRONE HCL 15 MG PO TABS
7.5000 mg | ORAL_TABLET | Freq: Three times a day (TID) | ORAL | Status: DC
  Administered 2024-08-13 – 2024-08-18 (×15): 7.5 mg via ORAL
  Filled 2024-08-13 (×15): qty 1

## 2024-08-13 MED ORDER — IOHEXOL 350 MG/ML SOLN
75.0000 mL | Freq: Once | INTRAVENOUS | Status: AC | PRN
  Administered 2024-08-13: 75 mL via INTRAVENOUS

## 2024-08-13 MED ORDER — VANCOMYCIN HCL 750 MG/150ML IV SOLN
750.0000 mg | Freq: Two times a day (BID) | INTRAVENOUS | Status: DC
  Administered 2024-08-14 – 2024-08-15 (×3): 750 mg via INTRAVENOUS
  Filled 2024-08-13 (×4): qty 150

## 2024-08-13 MED ORDER — SODIUM CHLORIDE 0.9 % IV BOLUS
1000.0000 mL | Freq: Once | INTRAVENOUS | Status: AC
  Administered 2024-08-13: 1000 mL via INTRAVENOUS

## 2024-08-13 MED ORDER — ZONISAMIDE 100 MG PO CAPS
100.0000 mg | ORAL_CAPSULE | Freq: Every day | ORAL | Status: DC
  Administered 2024-08-13 – 2024-08-18 (×6): 100 mg via ORAL
  Filled 2024-08-13 (×8): qty 1

## 2024-08-13 MED ORDER — SODIUM CHLORIDE 0.9 % IV SOLN
2.0000 g | INTRAVENOUS | Status: DC
  Administered 2024-08-14 – 2024-08-18 (×5): 2 g via INTRAVENOUS
  Filled 2024-08-13 (×5): qty 20

## 2024-08-13 MED ORDER — PREGABALIN 50 MG PO CAPS
100.0000 mg | ORAL_CAPSULE | Freq: Every day | ORAL | Status: DC
  Administered 2024-08-13 – 2024-08-18 (×6): 100 mg via ORAL
  Filled 2024-08-13 (×6): qty 2

## 2024-08-13 MED ORDER — ASPIRIN 81 MG PO CHEW
81.0000 mg | CHEWABLE_TABLET | Freq: Every day | ORAL | Status: DC
  Administered 2024-08-13 – 2024-08-18 (×6): 81 mg via ORAL
  Filled 2024-08-13 (×6): qty 1

## 2024-08-13 MED ORDER — ACETAMINOPHEN 650 MG RE SUPP: 650.0000 mg | Freq: Four times a day (QID) | RECTAL | Status: DC | PRN

## 2024-08-13 MED ORDER — ACYCLOVIR 800 MG PO TABS
400.0000 mg | ORAL_TABLET | Freq: Two times a day (BID) | ORAL | Status: DC
  Administered 2024-08-13 – 2024-08-18 (×10): 400 mg via ORAL
  Filled 2024-08-13 (×10): qty 1

## 2024-08-13 MED ORDER — ZONISAMIDE 100 MG PO CAPS
500.0000 mg | ORAL_CAPSULE | Freq: Every evening | ORAL | Status: DC
  Administered 2024-08-13 – 2024-08-17 (×5): 500 mg via ORAL
  Filled 2024-08-13 (×6): qty 5

## 2024-08-13 MED ORDER — BRIVARACETAM 100 MG PO TABS
100.0000 mg | ORAL_TABLET | Freq: Two times a day (BID) | ORAL | Status: DC
  Administered 2024-08-14: 100 mg via ORAL

## 2024-08-13 MED ORDER — METHYLPREDNISOLONE SODIUM SUCC 125 MG IJ SOLR
125.0000 mg | Freq: Once | INTRAMUSCULAR | Status: AC
  Administered 2024-08-13: 125 mg via INTRAVENOUS
  Filled 2024-08-13: qty 2

## 2024-08-13 MED ORDER — ATORVASTATIN CALCIUM 40 MG PO TABS
40.0000 mg | ORAL_TABLET | Freq: Every day | ORAL | Status: DC
  Administered 2024-08-13 – 2024-08-18 (×6): 40 mg via ORAL
  Filled 2024-08-13 (×6): qty 1

## 2024-08-13 MED ORDER — VANCOMYCIN HCL 1500 MG/300ML IV SOLN
1500.0000 mg | Freq: Once | INTRAVENOUS | Status: AC
  Administered 2024-08-13: 1500 mg via INTRAVENOUS
  Filled 2024-08-13: qty 300

## 2024-08-13 MED ORDER — ACETAMINOPHEN 325 MG PO TABS
650.0000 mg | ORAL_TABLET | Freq: Four times a day (QID) | ORAL | Status: DC | PRN
  Administered 2024-08-15: 650 mg via ORAL
  Filled 2024-08-13: qty 2

## 2024-08-13 MED ORDER — POTASSIUM CHLORIDE CRYS ER 20 MEQ PO TBCR
40.0000 meq | EXTENDED_RELEASE_TABLET | Freq: Once | ORAL | Status: AC
  Administered 2024-08-13: 40 meq via ORAL
  Filled 2024-08-13: qty 2

## 2024-08-13 MED ORDER — LACTATED RINGERS IV SOLN: INTRAVENOUS | Status: AC

## 2024-08-13 MED ORDER — SODIUM CHLORIDE 0.9 % IV SOLN
1.0000 g | Freq: Once | INTRAVENOUS | Status: AC
  Administered 2024-08-13: 1 g via INTRAVENOUS
  Filled 2024-08-13: qty 10

## 2024-08-13 MED ORDER — LACTATED RINGERS IV BOLUS
1000.0000 mL | Freq: Once | INTRAVENOUS | Status: AC
  Administered 2024-08-13: 1000 mL via INTRAVENOUS

## 2024-08-13 MED ORDER — ALBUTEROL SULFATE (2.5 MG/3ML) 0.083% IN NEBU
2.5000 mg | INHALATION_SOLUTION | RESPIRATORY_TRACT | Status: DC | PRN
  Administered 2024-08-14: 2.5 mg via RESPIRATORY_TRACT
  Filled 2024-08-13: qty 3

## 2024-08-13 MED ORDER — OXCARBAZEPINE 300 MG PO TABS
600.0000 mg | ORAL_TABLET | Freq: Two times a day (BID) | ORAL | Status: DC
  Administered 2024-08-13 – 2024-08-18 (×10): 600 mg via ORAL
  Filled 2024-08-13 (×10): qty 2

## 2024-08-13 MED ORDER — SODIUM CHLORIDE 0.9 % IV SOLN
100.0000 mg | Freq: Two times a day (BID) | INTRAVENOUS | Status: DC
  Administered 2024-08-13 – 2024-08-14 (×3): 100 mg via INTRAVENOUS
  Filled 2024-08-13 (×6): qty 100

## 2024-08-13 MED ORDER — DOXYCYCLINE HYCLATE 100 MG PO TABS
100.0000 mg | ORAL_TABLET | Freq: Once | ORAL | Status: AC
  Administered 2024-08-13: 100 mg via ORAL
  Filled 2024-08-13: qty 1

## 2024-08-13 NOTE — ED Notes (Signed)
 Date and time results received: 08/13/24 1425  Test: Lactic Acid Critical Value: 2.6  Name of Provider Notified: Daralene Barefoot, PA-C

## 2024-08-13 NOTE — Telephone Encounter (Signed)
 See other phone note pt advised to go to the ER

## 2024-08-13 NOTE — Telephone Encounter (Signed)
 Pt called in complaint of SOB hard time breathing she was crying she and her family has been treated for the flu she was advised if she is having a hard time breathing she needs to go to the ER for treatment she is going to have her son take her

## 2024-08-13 NOTE — Progress Notes (Signed)
 Pharmacy Antibiotic Note  [name] is a 44 y.o. female admitted on 08/13/2024 with pneumonia.  Pharmacy has been consulted for Vancomycin  dosing. CT angiogram PE protocol revealed diffuse opacities in both lung consistent with multifocal pneumonia, no evidence of PE Patient with recent  influenza 08/05/2024  Plan: Vancomycin  1500 mg IV loading dose, then 750mg  IV Q 12 hrs. Goal AUC 400-550. Expected AUC: 443 SCr used: 0.88 Also on Ceftriaxone  2gm IV q24h Doxycycline  100mg  IV q12h  F/U cxs and clinical progress Monitor V/S, labs and levels as indicated  Height: 5' 2 (157.5 cm) Weight: 90.7 kg (200 lb) IBW/kg (Calculated) : 50.1  Temp (24hrs), Avg:99.6 F (37.6 C), Min:99.5 F (37.5 C), Max:99.7 F (37.6 C)  Recent Labs  Lab 08/13/24 1130 08/13/24 1308  WBC 12.7*  --   CREATININE 0.88  --   LATICACIDVEN  --  2.6*    Estimated Creatinine Clearance: 84.5 mL/min (by C-G formula based on SCr of 0.88 mg/dL).    Allergies[1]  Antimicrobials this admission: Vancomycin  1/23 >>  Ceftriaxone  1/23>>x 5 days Doxycycline  1/23>>   Microbiology results: 1/23 BCx: pending  MRSA PCR:   Thank you for allowing pharmacy to be a part of this patients care.  Maurina Fawaz, BS Pharm D, BCPS Clinical Pharmacist 08/13/2024 3:43 PM     [1]  Allergies Allergen Reactions   Morphine Other (See Comments)    Unknown Reaction

## 2024-08-13 NOTE — H&P (Addendum)
 " History and Physical    [name] FMW:[id number] DOB: [birth year] DOA: 08/13/2024  PCP: Antonetta Rollene BRAVO, MD   Patient coming from: Home  I have personally briefly reviewed patient's old medical records in [facility]  Chief Complaint: cough, pneumonia  HPI: [name] is a 44 y.o. female with medical history significant of disorder migraines who comes to the ER with chief complaint of ongoing shortness of breath and cough, chills.  She was evaluated in the ER on 1/15 and diagnosed with influenza.  She took Tamiflu  but continued to have symptoms prompting her to come to the ER.  She states she might have had fever.  Reports chills.  Denies any chest pain.  Feels fatigued.  ED Course: Vital signs stable in the ER.  She was mildly tachypneic and borderline tachycardic.  She is requiring some supplemental oxygen .  Blood pressure stable.  Laboratory work notable for leukocytosis 12, lactic acid elevated 2.6, D-dimer 1.6, sodium 133, potassium 3.4, troponin negative.  CT angiogram PE protocol revealed diffuse opacities in both lung consistent with multifocal pneumonia, no evidence of PE.  Patient is admitted for hypoxia/sepsis secondary to pneumonia  Review of Systems: As per HPI otherwise all other systems were reviewed and are negative.   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 ([facility])   Vision loss, bilateral 05/08/2021    Past Surgical History:  Procedure Laterality Date   BRAIN SURGERY  07/30/2022   CARPAL TUNNEL RELEASE Right 01/05/2024   Procedure: CARPAL TUNNEL RELEASE;  Surgeon: Yvone Rush, MD;  Location: [facility]  ORS;  Service: Orthopedics;  Laterality: Right;   CESAREAN SECTION     x 3   ORIF WRIST FRACTURE Right 01/05/2024   Procedure: OPEN REDUCTION INTERNAL FIXATION (ORIF) WRIST FRACTURE;  Surgeon: Yvone Rush, MD;  Location: [facility] ORS;  Service: Orthopedics;  Laterality: Right;  OPEN REDUCTION INTERNAL FIXATION RIGHT WRIST AND CARPAL TUNNELL RELEASE     reports that she has been smoking cigarettes. She has a 10 pack-year smoking history. She has been exposed to tobacco smoke. She has never used smokeless tobacco. She reports that she does not currently use alcohol after a past usage of about 2.0 standard drinks of alcohol per week. She reports current drug use. Drug: Marijuana.  Allergies[1]  Family History  Problem Relation Age of Onset   Hypertension Mother    Healthy Father     Prior to Admission medications  Medication Sig Start Date End Date Taking? Authorizing Provider  acyclovir  (ZOVIRAX ) 400 MG tablet Take 1 tablet (400 mg total) by mouth 2 (two) times daily. 12/18/23   Del Wilhelmena Lloyd Sola, FNP  albuterol  (VENTOLIN  HFA) 108 (90 Base) MCG/ACT inhaler Inhale 1-2 puffs into the lungs every 6 (six) hours as needed for wheezing or shortness of breath. 08/05/24   Daralene Lonni BIRCH, PA-C  aspirin  81 MG chewable tablet Chew 1 tablet (81 mg total) by mouth daily. 09/06/23   Raenelle Coria, MD  atorvastatin  (LIPITOR) 40 MG tablet TAKE 1 TABLET(40 MG) BY MOUTH DAILY 05/28/24  Antonetta Rollene BRAVO, MD  brivaracetam  (BRIVIACT ) 100 MG TABS tablet Take 1 tablet (100 mg total) by mouth 2 (two) times daily. 08/11/24   Georjean Darice HERO, MD  BRIVIACT  100 MG TABS tablet TAKE 1 TABLET(100 MG) BY MOUTH TWICE DAILY 08/12/24   Georjean Darice HERO, MD  busPIRone  (BUSPAR ) 7.5 MG tablet Take 1 tablet (7.5 mg total) by mouth 3 (three) times daily. 03/09/24   Antonetta Rollene BRAVO, MD  chlorhexidine  (HIBICLENS ) 4 % external liquid Apply 15 mLs (1 Application total) topically as directed for 30 doses. Use as directed daily  for 5 days every other week for 6 weeks. 01/05/24   Orlando Camellia POUR, PA-C  Erenumab -aooe (AIMOVIG ) 140 MG/ML SOAJ Inject 140 mg into the skin every 30 (thirty) days. 01/14/24   Georjean Darice HERO, MD  hydrocortisone  ointment 0.5 % Apply 1 Application topically daily as needed for itching. 02/23/24   Stuart Vernell Norris, PA-C  nortriptyline  (PAMELOR ) 25 MG capsule TAKE 2 CAPSULES BY MOUTH EVERY NIGHT 01/14/24   Georjean Darice HERO, MD  nystatin  cream (MYCOSTATIN ) Apply to affected area 2 times daily (groin area) 02/23/24   Stuart Vernell Norris, PA-C  oseltamivir  (TAMIFLU ) 75 MG capsule Take 1 capsule (75 mg total) by mouth every 12 (twelve) hours. 08/05/24   Daralene Lonni BIRCH, PA-C  oxcarbazepine  (TRILEPTAL ) 600 MG tablet Take 1 tablet two times daily . 01/14/24   Georjean Darice HERO, MD  pregabalin  (LYRICA ) 100 MG capsule Take 1 capsule every night 07/16/24   Georjean Darice HERO, MD  promethazine -dextromethorphan  (PROMETHAZINE -DM) 6.25-15 MG/5ML syrup Take 5 mLs by mouth 4 (four) times daily as needed. 08/05/24   Daralene Lonni BIRCH, PA-C  semaglutide -weight management (WEGOVY ) 0.25 MG/0.5ML SOAJ SQ injection Inject 0.25 mg into the skin once a week. 03/10/24   Antonetta Rollene BRAVO, MD  terbinafine  (LAMISIL ) 250 MG tablet Take 1 tablet (250 mg total) by mouth daily. 03/09/24   Antonetta Rollene BRAVO, MD  tiZANidine  (ZANAFLEX ) 4 MG tablet TAKE 1 TABLET BY MOUTH EVERY 6 HOURS AS NEEDED FOR MUSCLE SPASMS 03/19/24   Georjean Darice HERO, MD  Ubrogepant  (UBRELVY ) 100 MG TABS Take 1 tablet as needed at onset of migraine. Do not take more than 3 a week 01/14/24   Georjean Darice HERO, MD  UNABLE TO FIND Med Name: Chux Pads 06/11/24   Antonetta Rollene BRAVO, MD  Vitamin D , Ergocalciferol , (DRISDOL ) 1.25 MG (50000 UNIT) CAPS capsule Take 1 capsule (50,000 Units total) by mouth every 7 (seven) days. 03/09/24   Antonetta Rollene BRAVO, MD  zonisamide  (ZONEGRAN ) 100 MG capsule TAKE ONE (1) CAPSULE BY MOUTH ONCE DAILY IN THE MORNING AND TAKE FIVE (5)  CAPSULES IN THE EVENING 01/14/24   Georjean Darice HERO, MD    Physical Exam: Vitals:   08/13/24 1345 08/13/24 1400 08/13/24 1430 08/13/24 1445  BP: 116/86 133/79 130/82 137/82  Pulse: (!) 107 98 (!) 107 (!) 104  Resp: (!) 27 (!) 37 (!) 30 (!) 25  Temp:      TempSrc:      SpO2: 97% 92% 92% 90%  Weight:      Height:        General: Alert, oriented HEENT: Moist oral mucosa Chest: Bilateral coarse crackles,no wheezing CVs: S1, S2, no murmur, regular rhythm Abdomen: Soft, nontender   Vitals:   08/13/24 1345 08/13/24 1400 08/13/24 1430 08/13/24 1445  BP: 116/86 133/79 130/82 137/82  Pulse: (!) 107 98 (!) 107 (!) 104  Resp: (!) 27 (!) 37 (!) 30 ROLLEN)  25  Temp:      TempSrc:      SpO2: 97% 92% 92% 90%  Weight:      Height:         Labs on Admission: I have personally reviewed following labs and imaging studies  CBC: Recent Labs  Lab 08/13/24 1130  WBC 12.7*  NEUTROABS 10.8*  HGB 11.3*  HCT 35.7*  MCV 88.1  PLT 470*   Basic Metabolic Panel: Recent Labs  Lab 08/13/24 1130  NA 133*  K 3.4*  CL 98  CO2 19*  GLUCOSE 93  BUN <5*  CREATININE 0.88  CALCIUM  8.8*   GFR: Estimated Creatinine Clearance: 84.5 mL/min (by C-G formula based on SCr of 0.88 mg/dL). Liver Function Tests: Recent Labs  Lab 08/13/24 1130  AST 17  ALT 12  ALKPHOS 158*  BILITOT 0.5  PROT 8.1  ALBUMIN 3.9   No results for input(s): LIPASE, AMYLASE in the last 168 hours. No results for input(s): AMMONIA  in the last 168 hours. Coagulation Profile: No results for input(s): INR, PROTIME in the last 168 hours. Cardiac Enzymes: No results for input(s): CKTOTAL, CKMB, CKMBINDEX, TROPONINI in the last 168 hours. BNP (last 3 results) Recent Labs    08/13/24 1351  PROBNP 230.0   HbA1C: No results for input(s): HGBA1C in the last 72 hours. CBG: No results for input(s): GLUCAP in the last 168 hours. Lipid Profile: No results for input(s): CHOL, HDL, LDLCALC, TRIG,  CHOLHDL, LDLDIRECT in the last 72 hours. Thyroid  Function Tests: No results for input(s): TSH, T4TOTAL, FREET4, T3FREE, THYROIDAB in the last 72 hours. Anemia Panel: No results for input(s): VITAMINB12, FOLATE, FERRITIN, TIBC, IRON, RETICCTPCT in the last 72 hours. Urine analysis:    Component Value Date/Time   COLORURINE YELLOW 10/27/2023 1721   APPEARANCEUR CLEAR 10/27/2023 1721   LABSPEC 1.006 10/27/2023 1721   PHURINE 7.0 10/27/2023 1721   GLUCOSEU NEGATIVE 10/27/2023 1721   HGBUR NEGATIVE 10/27/2023 1721   BILIRUBINUR NEGATIVE 10/27/2023 1721   KETONESUR NEGATIVE 10/27/2023 1721   PROTEINUR NEGATIVE 10/27/2023 1721   UROBILINOGEN 0.2 06/09/2013 1825   NITRITE NEGATIVE 10/27/2023 1721   LEUKOCYTESUR NEGATIVE 10/27/2023 1721    Radiological Exams on Admission: CT Angio Chest PE W/Cm &/Or Wo Cm Result Date: 08/13/2024 CLINICAL DATA:  Shortness of breath, cough EXAM: CT ANGIOGRAPHY CHEST WITH CONTRAST TECHNIQUE: Multidetector CT imaging of the chest was performed using the standard protocol during bolus administration of intravenous contrast. Multiplanar CT image reconstructions and MIPs were obtained to evaluate the vascular anatomy. RADIATION DOSE REDUCTION: This exam was performed according to the departmental dose-optimization program which includes automated exposure control, adjustment of the mA and/or kV according to patient size and/or use of iterative reconstruction technique. CONTRAST:  75mL OMNIPAQUE  IOHEXOL  350 MG/ML SOLN COMPARISON:  August 29, 2023 FINDINGS: Cardiovascular: Satisfactory opacification of the pulmonary arteries to the segmental level. No evidence of pulmonary embolism. Normal heart size. No pericardial effusion. Mediastinum/Nodes: Stable diffuse thyroid  enlargement. Mildly enlarged mediastinal adenopathy is noted appears to be enlarged compared to prior exam, with largest lymph node measuring 19 mm in subcarinal region. This most likely  is inflammatory or reactive in etiology. Esophagus is unremarkable. Lungs/Pleura: No pneumothorax or pleural effusion is noted. Patchy airspace opacities are noted throughout both lungs most consistent with multifocal pneumonia. Upper Abdomen: No acute abnormality. Musculoskeletal: No chest wall abnormality. No acute or significant osseous findings. Review of the MIP images confirms the above findings. IMPRESSION: 1. No definite evidence of  pulmonary embolus. 2. Patchy airspace opacities are noted throughout both lungs most consistent with multifocal pneumonia. 3. Mildly enlarged mediastinal adenopathy is noted which most likely is inflammatory or reactive in etiology. 4. Stable diffuse thyroid  enlargement. Electronically Signed   By: Lynwood Landy Raddle M.D.   On: 08/13/2024 13:30   DG Chest 2 View Result Date: 08/13/2024 CLINICAL DATA:  Shortness of breath and cough. Recent history of flu. EXAM: CHEST - 2 VIEW COMPARISON:  08/05/2024 FINDINGS: The heart size and mediastinal contours are within normal limits. New bilateral patchy multifocal airspace opacities, concerning for multifocal pneumonia. Pulmonary edema could have a similar appearance. No pleural effusion. No pneumothorax. No acute osseous abnormality. IMPRESSION: New bilateral patchy multifocal airspace opacities, concerning for multifocal pneumonia. Pulmonary edema could have a similar appearance. Electronically Signed   By: Harrietta Sherry M.D.   On: 08/13/2024 12:53    EKG: Independently reviewed.   Assessment/Plan  44 year old female with sepsis/hypoxia secondary to diffuse multifocal pneumonia.   Multifocal pneumonia, acute hypoxic respiratory failure Severe sepsis  -Started on ceftriaxone , doxycycline  in the ER.  Will continue them, add vancomycin .  Will check MRSA PCR as well. Check procalcitonin, urine streptococcal antigen, Legionella antigen Mucolytics, supportive care, albuterol  as needed Obtain sputum culture if able Follow-up  on blood culture  History of seizures, migraines: Resume home medications once reconciled  Recent influenza: Completed Tamiflu   DVT prophylaxis: Lovenox  Code Status: Full code Family Communication:   Disposition Plan: Telemetry Consults called:  Admission status: Inpatient  Severity of Illness: The appropriate patient status for this patient is INPATIENT. Inpatient status is judged to be reasonable and necessary in order to provide the required intensity of service to ensure the patient's safety. The patient's presenting symptoms, physical exam findings, and initial radiographic and laboratory data in the context of their chronic comorbidities is felt to place them at high risk for further clinical deterioration. Furthermore, it is not anticipated that the patient will be medically stable for discharge from the hospital within 2 midnights of admission.   * I certify that at the point of admission it is my clinical judgment that the patient will require inpatient hospital care spanning beyond 2 midnights from the point of admission due to high intensity of service, high risk for further deterioration and high frequency of surveillance required.DEWAINE Derryl Duval MD [facility]  08/13/2024, 3:05 PM         [1]  Allergies Allergen Reactions   Morphine Other (See Comments)    Unknown Reaction     "

## 2024-08-13 NOTE — ED Triage Notes (Signed)
 Pt comes in for SOB and cough. Pt states the cough and SOB since dx with the flu on 1/15. Pt called PCP and told her to come to the ED since she is still having s/s. A&Ox4.   Productive cough with clear phlegm

## 2024-08-13 NOTE — ED Provider Notes (Signed)
 " [place] EMERGENCY DEPARTMENT AT [facility] Provider Note   CSN: [id number] Arrival date & time: 08/13/24  1047     Patient presents with: Shortness of Breath and Cough   [name] is a 44 y.o. female.   Patient is a 44 year old female who presents emergency department the chief complaint of ongoing shortness of breath, nonproductive cough, wheezing.  She was evaluated on 1/15 and diagnosed with influenza.  She notes that she has been compliant with all medications.  She denies any associated chest pain.  She has had no abdominal pain, nausea, vomiting, diarrhea.  She denies any dizziness, lightheadedness or syncope.  She has had no increase edema to lower extremities.   Shortness of Breath Associated symptoms: cough   Cough Associated symptoms: shortness of breath        Prior to Admission medications  Medication Sig Start Date End Date Taking? Authorizing Provider  acyclovir  (ZOVIRAX ) 400 MG tablet Take 1 tablet (400 mg total) by mouth 2 (two) times daily. 12/18/23   Del Orbe Polanco, Iliana, FNP  albuterol  (VENTOLIN  HFA) 108 (90 Base) MCG/ACT inhaler Inhale 1-2 puffs into the lungs every 6 (six) hours as needed for wheezing or shortness of breath. 08/05/24   Daralene Lonni BIRCH, PA-C  aspirin  81 MG chewable tablet Chew 1 tablet (81 mg total) by mouth daily. 09/06/23   Raenelle Coria, MD  atorvastatin  (LIPITOR) 40 MG tablet TAKE 1 TABLET(40 MG) BY MOUTH DAILY 05/28/24   Antonetta Rollene BRAVO, MD  brivaracetam  (BRIVIACT ) 100 MG TABS tablet Take 1 tablet (100 mg total) by mouth 2 (two) times daily. 08/11/24   Georjean Darice CHRISTELLA, MD  BRIVIACT  100 MG TABS tablet TAKE 1 TABLET(100 MG) BY MOUTH TWICE DAILY 08/12/24   Georjean Darice CHRISTELLA, MD  busPIRone  (BUSPAR ) 7.5 MG tablet Take 1 tablet (7.5 mg total) by mouth 3 (three) times daily. 03/09/24   Antonetta Rollene BRAVO, MD  chlorhexidine  (HIBICLENS ) 4 % external liquid Apply 15 mLs (1 Application total) topically as directed for 30  doses. Use as directed daily for 5 days every other week for 6 weeks. 01/05/24   Orlando Camellia POUR, PA-C  Erenumab -aooe (AIMOVIG ) 140 MG/ML SOAJ Inject 140 mg into the skin every 30 (thirty) days. 01/14/24   Georjean Darice CHRISTELLA, MD  hydrocortisone  ointment 0.5 % Apply 1 Application topically daily as needed for itching. 02/23/24   Stuart Vernell Norris, PA-C  nortriptyline  (PAMELOR ) 25 MG capsule TAKE 2 CAPSULES BY MOUTH EVERY NIGHT 01/14/24   Georjean Darice CHRISTELLA, MD  nystatin  cream (MYCOSTATIN ) Apply to affected area 2 times daily (groin area) 02/23/24   Stuart Vernell Norris, PA-C  oseltamivir  (TAMIFLU ) 75 MG capsule Take 1 capsule (75 mg total) by mouth every 12 (twelve) hours. 08/05/24   Daralene Lonni BIRCH, PA-C  oxcarbazepine  (TRILEPTAL ) 600 MG tablet Take 1 tablet two times daily . 01/14/24   Georjean Darice CHRISTELLA, MD  pregabalin  (LYRICA ) 100 MG capsule Take 1 capsule every night 07/16/24   Georjean Darice CHRISTELLA, MD  promethazine -dextromethorphan  (PROMETHAZINE -DM) 6.25-15 MG/5ML syrup Take 5 mLs by mouth 4 (four) times daily as needed. 08/05/24   Daralene Lonni BIRCH, PA-C  semaglutide -weight management (WEGOVY ) 0.25 MG/0.5ML SOAJ SQ injection Inject 0.25 mg into the skin once a week. 03/10/24   Antonetta Rollene BRAVO, MD  terbinafine  (LAMISIL ) 250 MG tablet Take 1 tablet (250 mg total) by mouth daily. 03/09/24   Antonetta Rollene BRAVO, MD  tiZANidine  (ZANAFLEX ) 4 MG tablet TAKE 1 TABLET BY  MOUTH EVERY 6 HOURS AS NEEDED FOR MUSCLE SPASMS 03/19/24   Georjean Darice HERO, MD  Ubrogepant  (UBRELVY ) 100 MG TABS Take 1 tablet as needed at onset of migraine. Do not take more than 3 a week 01/14/24   Georjean Darice HERO, MD  UNABLE TO FIND Med Name: Chux Pads 06/11/24   Antonetta Rollene BRAVO, MD  Vitamin D , Ergocalciferol , (DRISDOL ) 1.25 MG (50000 UNIT) CAPS capsule Take 1 capsule (50,000 Units total) by mouth every 7 (seven) days. 03/09/24   Antonetta Rollene BRAVO, MD  zonisamide  (ZONEGRAN ) 100 MG capsule TAKE ONE (1) CAPSULE BY MOUTH ONCE DAILY IN  THE MORNING AND TAKE FIVE (5) CAPSULES IN THE EVENING 01/14/24   Georjean Darice HERO, MD    Allergies: Morphine    Review of Systems  Respiratory:  Positive for cough and shortness of breath.   All other systems reviewed and are negative.   Updated Vital Signs BP 101/81 (BP Location: Right Arm)   Pulse (!) 112   Temp 99.5 F (37.5 C) (Oral)   Resp (!) 24   Ht 5' 2 (1.575 m)   Wt 90.7 kg   SpO2 95%   BMI 36.58 kg/m   Physical Exam Vitals and nursing note reviewed.  Constitutional:      General: She is not in acute distress.    Appearance: Normal appearance. She is not ill-appearing.  HENT:     Head: Normocephalic and atraumatic.     Nose: Nose normal.     Mouth/Throat:     Mouth: Mucous membranes are moist.  Eyes:     Extraocular Movements: Extraocular movements intact.     Conjunctiva/sclera: Conjunctivae normal.     Pupils: Pupils are equal, round, and reactive to light.  Cardiovascular:     Rate and Rhythm: Normal rate and regular rhythm.     Pulses: Normal pulses.     Heart sounds: Normal heart sounds. No murmur heard.    No gallop.  Pulmonary:     Effort: Pulmonary effort is normal. No tachypnea.     Breath sounds: Wheezing present. No decreased breath sounds, rhonchi or rales.  Chest:     Chest wall: No tenderness.  Abdominal:     General: Abdomen is flat. Bowel sounds are normal.     Palpations: Abdomen is soft.     Tenderness: There is no guarding.  Musculoskeletal:        General: Normal range of motion.     Cervical back: Normal range of motion and neck supple.     Right lower leg: No edema.     Left lower leg: No edema.  Skin:    General: Skin is warm and dry.     Findings: No rash.  Neurological:     General: No focal deficit present.     Mental Status: She is alert and oriented to person, place, and time. Mental status is at baseline.     Cranial Nerves: No cranial nerve deficit.     Motor: No weakness.  Psychiatric:        Mood and Affect: Mood  normal.        Behavior: Behavior normal.        Thought Content: Thought content normal.        Judgment: Judgment normal.     (all labs ordered are listed, but only abnormal results are displayed) Labs Reviewed  CBC WITH DIFFERENTIAL/PLATELET - Abnormal; Notable for the following components:      Result Value  WBC 12.7 (*)    Hemoglobin 11.3 (*)    HCT 35.7 (*)    RDW 16.4 (*)    Platelets 470 (*)    Neutro Abs 10.8 (*)    All other components within normal limits  COMPREHENSIVE METABOLIC PANEL WITH GFR - Abnormal; Notable for the following components:   Sodium 133 (*)    Potassium 3.4 (*)    CO2 19 (*)    BUN <5 (*)    Calcium  8.8 (*)    Alkaline Phosphatase 158 (*)    Anion gap 16 (*)    All other components within normal limits  D-DIMER, QUANTITATIVE - Abnormal; Notable for the following components:   D-Dimer, Quant 1.78 (*)    All other components within normal limits  TROPONIN T, HIGH SENSITIVITY    EKG: None  Radiology: No results found.   .Critical Care  Performed by: Daralene Lonni BIRCH, PA-C Authorized by: Daralene Lonni BIRCH, PA-C   Critical care provider statement:    Critical care time (minutes):  35   Critical care was necessary to treat or prevent imminent or life-threatening deterioration of the following conditions:  Respiratory failure   Critical care was time spent personally by me on the following activities:  Development of treatment plan with patient or surrogate, discussions with consultants, evaluation of patient's response to treatment, examination of patient, ordering and review of laboratory studies, ordering and review of radiographic studies, ordering and performing treatments and interventions, pulse oximetry, re-evaluation of patient's condition and review of old charts   I assumed direction of critical care for this patient from another provider in my specialty: no     Care discussed with: admitting provider      Medications  Ordered in the ED  ipratropium-albuterol  (DUONEB) 0.5-2.5 (3) MG/3ML nebulizer solution 3 mL (has no administration in time range)  potassium chloride  SA (KLOR-CON  M) CR tablet 40 mEq (has no administration in time range)  methylPREDNISolone  sodium succinate (SOLU-MEDROL ) 125 mg/2 mL injection 125 mg (125 mg Intravenous Given 08/13/24 1146)  sodium chloride  0.9 % bolus 1,000 mL (1,000 mLs Intravenous New Bag/Given 08/13/24 1145)                                    Medical Decision Making Amount and/or Complexity of Data Reviewed Labs: ordered. Radiology: ordered.  Risk Prescription drug management. Decision regarding hospitalization.   This patient presents to the ED for concern of dyspnea, cough, congestion differential diagnosis includes pneumonia, acute viral syndrome, sepsis, COPD, CHF    Additional history obtained:  Additional history obtained from medical records External records from outside source obtained and reviewed including medical records   Lab Tests:  I Ordered, and personally interpreted labs.  The pertinent results include: Leukocytosis, anemia at baseline, normal kidney function liver function, mild hypokalemia, mild hyponatremia, elevated lactic acid, elevated D-dimer   Imaging Studies ordered:  I ordered imaging studies including chest x-ray, CTA chest I independently visualized and interpreted imaging which showed multifocal pneumonia, no pulmonary embolus I agree with the radiologist interpretation   Medicines ordered and prescription drug management:  I ordered medication including IV fluids, Rocephin , doxycycline , potassium for sepsis, pneumonia Reevaluation of the patient after these medicines showed that the patient improved I have reviewed the patients home medicines and have made adjustments as needed   Problem List / ED Course:  Patient does remain stable at this time.  Symptoms have improved with treatment in the emergency department.   Discussed with patient we will plan for admission to the hospital service given her new oxygen  requirement.  She is currently on 2 L nasal cannula at this point.  She was dropping into the upper 80s on my last exam.  Did provide Rocephin  and doxycycline  for her multifocal pneumonia.  She was given a breathing treatment as well as IV steroids given her wheezing.  Lactic acid is elevated.  Have given 2 L of fluids.  Have avoided full sepsis bolus to avoid fluid overload and is otherwise stable patient with no hypotension.  Have discussed patient case with Dr. Sigdel with the hospital service who has excepted for admission.   Social Determinants of Health:  None        Final diagnoses:  None    ED Discharge Orders     None          Daralene Lonni JONETTA DEVONNA 08/13/24 1527    Suzette Pac, MD 08/14/24 [phone]  "

## 2024-08-13 NOTE — Telephone Encounter (Signed)
 Meka(self) called back wanting to speak with clinical, she stated that she called yesterday and the day before. She stated that Briviact  is not Ready and that she is hardly breathing with Flu medication. The meds from the hospital are not doing any good. She went to get Mucinex  severe due to coughing really, really bad. She stated she was walking out around the grocery store out of breath, and currently in bed barely breathing.

## 2024-08-14 DIAGNOSIS — A419 Sepsis, unspecified organism: Secondary | ICD-10-CM | POA: Diagnosis not present

## 2024-08-14 DIAGNOSIS — J189 Pneumonia, unspecified organism: Secondary | ICD-10-CM | POA: Diagnosis not present

## 2024-08-14 LAB — BASIC METABOLIC PANEL WITH GFR
Anion gap: 7 (ref 5–15)
BUN: 8 mg/dL (ref 6–20)
CO2: 18 mmol/L — ABNORMAL LOW (ref 22–32)
Calcium: 8.9 mg/dL (ref 8.9–10.3)
Chloride: 109 mmol/L (ref 98–111)
Creatinine, Ser: 0.76 mg/dL (ref 0.44–1.00)
GFR, Estimated: >60 mL/min
Glucose, Bld: 92 mg/dL (ref 70–99)
Potassium: 4.3 mmol/L (ref 3.5–5.1)
Sodium: 134 mmol/L — ABNORMAL LOW (ref 135–145)

## 2024-08-14 LAB — CBC
HCT: 31.7 % — ABNORMAL LOW (ref 36.0–46.0)
Hemoglobin: 10.2 g/dL — ABNORMAL LOW (ref 12.0–15.0)
MCH: 28.2 pg (ref 26.0–34.0)
MCHC: 32.2 g/dL (ref 30.0–36.0)
MCV: 87.6 fL (ref 80.0–100.0)
Platelets: 447 10*3/uL — ABNORMAL HIGH (ref 150–400)
RBC: 3.62 MIL/uL — ABNORMAL LOW (ref 3.87–5.11)
RDW: 16.6 % — ABNORMAL HIGH (ref 11.5–15.5)
WBC: 11.8 10*3/uL — ABNORMAL HIGH (ref 4.0–10.5)
nRBC: 0 % (ref 0.0–0.2)

## 2024-08-14 LAB — MRSA NEXT GEN BY PCR, NASAL: MRSA by PCR Next Gen: NOT DETECTED

## 2024-08-14 MED ORDER — GUAIFENESIN-DM 100-10 MG/5ML PO SYRP
5.0000 mL | ORAL_SOLUTION | ORAL | Status: DC | PRN
  Administered 2024-08-14 – 2024-08-17 (×5): 5 mL via ORAL
  Filled 2024-08-14 (×4): qty 5

## 2024-08-14 MED ORDER — IPRATROPIUM-ALBUTEROL 0.5-2.5 (3) MG/3ML IN SOLN
3.0000 mL | Freq: Three times a day (TID) | RESPIRATORY_TRACT | Status: DC
  Administered 2024-08-15 – 2024-08-18 (×10): 3 mL via RESPIRATORY_TRACT
  Filled 2024-08-14 (×10): qty 3

## 2024-08-14 MED ORDER — BRIVARACETAM 100 MG PO TABS
100.0000 mg | ORAL_TABLET | Freq: Two times a day (BID) | ORAL | Status: DC
  Administered 2024-08-14 – 2024-08-18 (×7): 100 mg via ORAL
  Filled 2024-08-14 (×3): qty 1

## 2024-08-14 MED ORDER — ALBUTEROL SULFATE (2.5 MG/3ML) 0.083% IN NEBU: 2.5000 mg | INHALATION_SOLUTION | RESPIRATORY_TRACT | Status: DC | PRN

## 2024-08-14 NOTE — Progress Notes (Signed)
 " PROGRESS NOTE    [name]  FMW:[id number] DOB: Mar 04, 1981 DOA: 08/13/2024 PCP: Antonetta Rollene BRAVO, MD   Brief Narrative:   [name] is a 44 y.o. female with medical history significant of disorder migraines who comes to the ER with chief complaint of ongoing shortness of breath and cough, chills.  She was evaluated in the ER on 1/15 and diagnosed with influenza.  She took Tamiflu  but continued to have symptoms prompting her to come to the ER.  She states she might have had fever.  Reports chills.  Denies any chest pain.  Feels fatigued.   ED Course: Vital signs stable in the ER.  She was mildly tachypneic and borderline tachycardic.  She is requiring some supplemental oxygen .  Blood pressure stable.  Laboratory work notable for leukocytosis 12, lactic acid elevated 2.6, D-dimer 1.6, sodium 133, potassium 3.4, troponin negative.  CT angiogram PE protocol revealed diffuse opacities in both lung consistent with multifocal pneumonia, no evidence of PE.  Patient is admitted for hypoxia/sepsis secondary to pneumonia   Assessment & Plan:   Principal Problem:   Sepsis due to pneumonia [facility]) Active Problems:   Cigarette smoker   Depression   Seizure (HCC)   GAD (generalized anxiety disorder)   Morbid obesity (HCC)   Acute hypoxemic respiratory failure (HCC)   Migraine headache   44 year old female with sepsis/hypoxia secondary to diffuse multifocal pneumonia.     Multifocal pneumonia, acute hypoxic respiratory failure Severe sepsis Recent influenza A   - Remains stable.  Supplemental oxygen  3 L/min today.  Will continue vancomycin , ceftriaxone  and doxycycline .   -proCalcitonin 0.20 -Check MRSA PCR Follow-up urine streptococcal antigen, Legionella antigen Mucolytics, supportive care, albuterol  as needed Obtain sputum culture if able Follow-up on blood culture, negative so far   History of seizures, migraines: Resume home medications-continue brivaracetam , buspirone ,  Trileptal , nortriptyline , pregabalin , zonisamide    Recent influenza: Completed Tamiflu    DVT prophylaxis: Lovenox  Code Status: Full code Family Communication:   Disposition Plan: Telemetry Consults called:  Admission status: Inpatient   Severity of Illness: The appropriate patient status for this patient is INPATIENT. Inpatient status is judged to be reasonable and necessary in order to provide the required intensity of service to ensure the patient's safety. The patient's presenting symptoms, physical exam findings, and initial radiographic and laboratory data in the context of their chronic comorbidities is felt to place them at high risk for further clinical deterioration. Furthermore, it is not anticipated that the patient will be medically stable for discharge from the hospital within 2 midnights of admission.    * I certify that at the point of admission it is my clinical judgment that the patient will require inpatient hospital care spanning beyond 2 midnights from the point of admission due to high intensity of service, high risk for further deterioration and high frequency of surveillance require  Subjective:  Continues to have cough, no fever.  Some shortness of breath.  Slightly better than yesterday.  Objective: Vitals:   08/13/24 1932 08/13/24 2356 08/14/24 0328 08/14/24 1348  BP: (!) 140/78 (!) 148/84 129/87 130/73  Pulse: (!) 102 88 86 98  Resp: 20 20 20 20   Temp: 98.2 F (36.8 C) 98.6 F (37 C) 98.1 F (36.7 C) 98.6 F (37 C)  TempSrc: Oral Oral Oral Oral  SpO2: 98% 98% 100% 97%  Weight:      Height:        Intake/Output Summary (Last 24 hours) at 08/14/2024 1717 Last  data filed at 08/14/2024 0955 Gross per 24 hour  Intake 720 ml  Output --  Net 720 ml   Filed Weights   08/13/24 1115  Weight: 90.7 kg    Examination:  General: Alert, oriented HEENT: Moist oral mucosa Chest: Bilateral coarse crackles,no wheezing CVs: S1, S2, no murmur, regular  rhythm Abdomen: Soft, nontender    Data Reviewed: I have personally reviewed following labs and imaging studies  CBC: Recent Labs  Lab 08/13/24 1130 08/14/24 0402  WBC 12.7* 11.8*  NEUTROABS 10.8*  --   HGB 11.3* 10.2*  HCT 35.7* 31.7*  MCV 88.1 87.6  PLT 470* 447*   Basic Metabolic Panel: Recent Labs  Lab 08/13/24 1130 08/14/24 0402  NA 133* 134*  K 3.4* 4.3  CL 98 109  CO2 19* 18*  GLUCOSE 93 92  BUN <5* 8  CREATININE 0.88 0.76  CALCIUM  8.8* 8.9   GFR: Estimated Creatinine Clearance: 92.9 mL/min (by C-G formula based on SCr of 0.76 mg/dL). Liver Function Tests: Recent Labs  Lab 08/13/24 1130  AST 17  ALT 12  ALKPHOS 158*  BILITOT 0.5  PROT 8.1  ALBUMIN 3.9   No results for input(s): LIPASE, AMYLASE in the last 168 hours. No results for input(s): AMMONIA  in the last 168 hours. Coagulation Profile: No results for input(s): INR, PROTIME in the last 168 hours. Cardiac Enzymes: No results for input(s): CKTOTAL, CKMB, CKMBINDEX, TROPONINI in the last 168 hours. BNP (last 3 results) Recent Labs    08/13/24 1351  PROBNP 230.0   HbA1C: No results for input(s): HGBA1C in the last 72 hours. CBG: No results for input(s): GLUCAP in the last 168 hours. Lipid Profile: No results for input(s): CHOL, HDL, LDLCALC, TRIG, CHOLHDL, LDLDIRECT in the last 72 hours. Thyroid  Function Tests: No results for input(s): TSH, T4TOTAL, FREET4, T3FREE, THYROIDAB in the last 72 hours. Anemia Panel: No results for input(s): VITAMINB12, FOLATE, FERRITIN, TIBC, IRON, RETICCTPCT in the last 72 hours. Sepsis Labs: Recent Labs  Lab 08/13/24 1308 08/13/24 1512  PROCALCITON  --  0.20  LATICACIDVEN 2.6* 1.2    Recent Results (from the past 240 hours)  Culture, blood (routine x 2)     Status: None (Preliminary result)   Collection Time: 08/13/24  1:08 PM   Specimen: BLOOD  Result Value Ref Range Status   Specimen  Description BLOOD RIGHT ASSIST CONTROL  Final   Special Requests   Final    BOTTLES DRAWN AEROBIC AND ANAEROBIC Blood Culture adequate volume   Culture   Final    NO GROWTH < 24 HOURS Performed at [facility], [address], [place], [place] [address]    Report Status PENDING  Incomplete  Culture, blood (routine x 2)     Status: None (Preliminary result)   Collection Time: 08/13/24  1:51 PM   Specimen: BLOOD LEFT ARM  Result Value Ref Range Status   Specimen Description BLOOD LEFT ARM  Final   Special Requests   Final    BOTTLES DRAWN AEROBIC AND ANAEROBIC Blood Culture adequate volume   Culture   Final    NO GROWTH < 24 HOURS Performed at [facility], [address], [place], [place] [address]    Report Status PENDING  Incomplete         Radiology Studies: CT Angio Chest PE W/Cm &/Or Wo Cm Result Date: 08/13/2024 CLINICAL DATA:  Shortness of breath, cough EXAM: CT ANGIOGRAPHY CHEST WITH CONTRAST TECHNIQUE: Multidetector CT imaging of the chest  was performed using the standard protocol during bolus administration of intravenous contrast. Multiplanar CT image reconstructions and MIPs were obtained to evaluate the vascular anatomy. RADIATION DOSE REDUCTION: This exam was performed according to the departmental dose-optimization program which includes automated exposure control, adjustment of the mA and/or kV according to patient size and/or use of iterative reconstruction technique. CONTRAST:  75mL OMNIPAQUE  IOHEXOL  350 MG/ML SOLN COMPARISON:  August 29, 2023 FINDINGS: Cardiovascular: Satisfactory opacification of the pulmonary arteries to the segmental level. No evidence of pulmonary embolism. Normal heart size. No pericardial effusion. Mediastinum/Nodes: Stable diffuse thyroid  enlargement. Mildly enlarged mediastinal adenopathy is noted appears to be enlarged compared to prior exam, with largest lymph node measuring 19 mm in subcarinal region. This most likely is inflammatory or  reactive in etiology. Esophagus is unremarkable. Lungs/Pleura: No pneumothorax or pleural effusion is noted. Patchy airspace opacities are noted throughout both lungs most consistent with multifocal pneumonia. Upper Abdomen: No acute abnormality. Musculoskeletal: No chest wall abnormality. No acute or significant osseous findings. Review of the MIP images confirms the above findings. IMPRESSION: 1. No definite evidence of pulmonary embolus. 2. Patchy airspace opacities are noted throughout both lungs most consistent with multifocal pneumonia. 3. Mildly enlarged mediastinal adenopathy is noted which most likely is inflammatory or reactive in etiology. 4. Stable diffuse thyroid  enlargement. Electronically Signed   By: Lynwood Landy Raddle M.D.   On: 08/13/2024 13:30   DG Chest 2 View Result Date: 08/13/2024 CLINICAL DATA:  Shortness of breath and cough. Recent history of flu. EXAM: CHEST - 2 VIEW COMPARISON:  08/05/2024 FINDINGS: The heart size and mediastinal contours are within normal limits. New bilateral patchy multifocal airspace opacities, concerning for multifocal pneumonia. Pulmonary edema could have a similar appearance. No pleural effusion. No pneumothorax. No acute osseous abnormality. IMPRESSION: New bilateral patchy multifocal airspace opacities, concerning for multifocal pneumonia. Pulmonary edema could have a similar appearance. Electronically Signed   By: Harrietta Sherry M.D.   On: 08/13/2024 12:53        Scheduled Meds:  acyclovir   400 mg Oral BID   aspirin   81 mg Oral Daily   atorvastatin   40 mg Oral Daily   brivaracetam   100 mg Oral BID   busPIRone   7.5 mg Oral TID   enoxaparin  (LOVENOX ) injection  40 mg Subcutaneous Q24H   guaiFENesin   600 mg Oral BID   nortriptyline   50 mg Oral QHS   OXcarbazepine   600 mg Oral BID   pregabalin   100 mg Oral Daily   zonisamide   100 mg Oral Daily   zonisamide   500 mg Oral QPM   Continuous Infusions:  cefTRIAXone  (ROCEPHIN )  IV 2 g (08/14/24 0923)    doxycycline  (VIBRAMYCIN ) IV 100 mg (08/14/24 0908)   vancomycin  750 mg (08/14/24 0441)          Derryl Duval, MD [facility] 08/14/2024, 5:17 PM   "

## 2024-08-14 NOTE — Plan of Care (Signed)
  Problem: Education: Goal: Knowledge of General Education information will improve Description: Including pain rating scale, medication(s)/side effects and non-pharmacologic comfort measures Outcome: Progressing   Problem: Health Behavior/Discharge Planning: Goal: Ability to manage health-related needs will improve Outcome: Progressing   Problem: Clinical Measurements: Goal: Ability to maintain clinical measurements within normal limits will improve Outcome: Progressing Goal: Will remain free from infection Outcome: Progressing Goal: Diagnostic test results will improve Outcome: Progressing Goal: Respiratory complications will improve Outcome: Progressing Goal: Cardiovascular complication will be avoided Outcome: Progressing   Problem: Activity: Goal: Risk for activity intolerance will decrease Outcome: Progressing   Problem: Nutrition: Goal: Adequate nutrition will be maintained Outcome: Progressing   Problem: Coping: Goal: Level of anxiety will decrease Outcome: Progressing   Problem: Elimination: Goal: Will not experience complications related to bowel motility Outcome: Progressing Goal: Will not experience complications related to urinary retention Outcome: Progressing   Problem: Pain Managment: Goal: General experience of comfort will improve and/or be controlled Outcome: Progressing   Problem: Safety: Goal: Ability to remain free from injury will improve Outcome: Progressing   Problem: Skin Integrity: Goal: Risk for impaired skin integrity will decrease Outcome: Progressing   Problem: Activity: Goal: Ability to tolerate increased activity will improve Outcome: Progressing   Problem: Clinical Measurements: Goal: Ability to maintain [place] body temperature in the normal range will improve Outcome: Progressing   Problem: Respiratory: Goal: Ability to maintain adequate ventilation will improve Outcome: Progressing Goal: Ability to maintain Treyvion clear airway  will improve Outcome: Progressing   Problem: Education: Goal: Knowledge of General Education information will improve Description: Including pain rating scale, medication(s)/side effects and non-pharmacologic comfort measures Outcome: Progressing   Problem: Health Behavior/Discharge Planning: Goal: Ability to manage health-related needs will improve Outcome: Progressing   Problem: Clinical Measurements: Goal: Ability to maintain clinical measurements within normal limits will improve Outcome: Progressing Goal: Will remain free from infection Outcome: Progressing Goal: Diagnostic test results will improve Outcome: Progressing Goal: Respiratory complications will improve Outcome: Progressing Goal: Cardiovascular complication will be avoided Outcome: Progressing   Problem: Activity: Goal: Risk for activity intolerance will decrease Outcome: Progressing   Problem: Nutrition: Goal: Adequate nutrition will be maintained Outcome: Progressing   Problem: Coping: Goal: Level of anxiety will decrease Outcome: Progressing   Problem: Elimination: Goal: Will not experience complications related to bowel motility Outcome: Progressing Goal: Will not experience complications related to urinary retention Outcome: Progressing   Problem: Pain Managment: Goal: General experience of comfort will improve and/or be controlled Outcome: Progressing   Problem: Safety: Goal: Ability to remain free from injury will improve Outcome: Progressing   Problem: Skin Integrity: Goal: Risk for impaired skin integrity will decrease Outcome: Progressing   Problem: Activity: Goal: Ability to tolerate increased activity will improve Outcome: Progressing   Problem: Clinical Measurements: Goal: Ability to maintain [place] body temperature in the normal range will improve Outcome: Progressing   Problem: Respiratory: Goal: Ability to maintain adequate ventilation will improve Outcome: Progressing Goal:  Ability to maintain Toren clear airway will improve Outcome: Progressing

## 2024-08-14 NOTE — Plan of Care (Signed)
" °  Problem: Education: Goal: Knowledge of General Education information will improve Description: Including pain rating scale, medication(s)/side effects and non-pharmacologic comfort measures Outcome: Progressing   Problem: Health Behavior/Discharge Planning: Goal: Ability to manage health-related needs will improve Outcome: Progressing   Problem: Clinical Measurements: Goal: Ability to maintain clinical measurements within normal limits will improve Outcome: Progressing Goal: Will remain free from infection Outcome: Progressing Goal: Diagnostic test results will improve Outcome: Progressing Goal: Respiratory complications will improve Outcome: Progressing Goal: Cardiovascular complication will be avoided Outcome: Progressing   Problem: Activity: Goal: Risk for activity intolerance will decrease Outcome: Progressing   Problem: Nutrition: Goal: Adequate nutrition will be maintained Outcome: Progressing   Problem: Coping: Goal: Level of anxiety will decrease Outcome: Progressing   Problem: Elimination: Goal: Will not experience complications related to bowel motility Outcome: Progressing Goal: Will not experience complications related to urinary retention Outcome: Progressing   Problem: Pain Managment: Goal: General experience of comfort will improve and/or be controlled Outcome: Progressing   Problem: Activity: Goal: Ability to tolerate increased activity will improve Outcome: Progressing   Problem: Clinical Measurements: Goal: Ability to maintain a body temperature in the normal range will improve Outcome: Progressing   Problem: Respiratory: Goal: Ability to maintain adequate ventilation will improve Outcome: Progressing Goal: Ability to maintain a clear airway will improve Outcome: Progressing   "

## 2024-08-15 ENCOUNTER — Encounter: Payer: Self-pay | Admitting: Family Medicine

## 2024-08-15 DIAGNOSIS — A419 Sepsis, unspecified organism: Secondary | ICD-10-CM | POA: Diagnosis not present

## 2024-08-15 DIAGNOSIS — R030 Elevated blood-pressure reading, without diagnosis of hypertension: Secondary | ICD-10-CM | POA: Insufficient documentation

## 2024-08-15 DIAGNOSIS — J189 Pneumonia, unspecified organism: Secondary | ICD-10-CM | POA: Diagnosis not present

## 2024-08-15 DIAGNOSIS — H6691 Otitis media, unspecified, right ear: Secondary | ICD-10-CM | POA: Insufficient documentation

## 2024-08-15 MED ORDER — DOXYCYCLINE HYCLATE 100 MG PO TABS
100.0000 mg | ORAL_TABLET | Freq: Two times a day (BID) | ORAL | Status: DC
  Administered 2024-08-15 – 2024-08-18 (×7): 100 mg via ORAL
  Filled 2024-08-15 (×7): qty 1

## 2024-08-15 NOTE — Assessment & Plan Note (Signed)
" °  Patient re-educated about  the importance of commitment to a  minimum of 150 minutes of exercise per week as able.  The importance of healthy food choices with portion control discussed, as well as eating regularly and within a 12 hour window most days. The need to choose clean , green food 50 to 75% of the time is discussed, as well as to make water the primary drink and set a goal of 64 ounces water daily.       08/13/2024   11:15 AM 08/05/2024    2:00 PM 08/03/2024    1:48 PM  Weight /BMI  Weight 200 lb 200 lb 200 lb 12.8 oz  Height 5' 2 (1.575 m)  5' 2 (1.575 m)  BMI 36.58 kg/m2 36.58 kg/m2 36.73 kg/m2     "

## 2024-08-15 NOTE — Assessment & Plan Note (Signed)
 Neds treatment and therapy , info provided for pt call for therapy

## 2024-08-15 NOTE — Plan of Care (Signed)
  Problem: Education: Goal: Knowledge of General Education information will improve Description: Including pain rating scale, medication(s)/side effects and non-pharmacologic comfort measures Outcome: Progressing   Problem: Nutrition: Goal: Adequate nutrition will be maintained Outcome: Progressing   Problem: Pain Managment: Goal: General experience of comfort will improve and/or be controlled Outcome: Progressing

## 2024-08-15 NOTE — Assessment & Plan Note (Signed)
 Uncontrolled , therapy recommended and referral to local services in the [facility]  sent

## 2024-08-15 NOTE — Progress Notes (Signed)
 Patient admitted for Sepsis r/t Pneumonia currently requiring supplemental O2 at 2.5L and IV Abx, Inpatient Care Management (ICM) conducted chart review and contacted patient by phone to complete brief assessment.   Patient confirmed living in [address]st Floor [address] alone, is independent with intermittent reliance on assistive devices, but has a RW, Cane, Tub Chair and a BSC, does NOT have Home O2 , and is open with [facility] thru [employer], 9A - 3P, Mon - Fri.  Patient confirmed she no longer drives and is reliant on family, [facility], or [facility] / [employer] transport to get to appts and run errands.  Previously open with [facility] and prefers same agency if needed again.   Patient with no immediate ICM needs identified at this time. However, IPCM team will continue to follow along and monitor patient advancement through interdisciplinary progression rounds. If patient transition needs arise, please place a TOC consult to prompt IPCM follow-up.    08/15/24 1438  TOC Brief Assessment  Insurance and Status Reviewed  Patient has primary care physician Yes  Home environment has been reviewed Home with Self  Prior level of function: Independent with intermittent use of assistive devices  Prior/Current Home Services Current home services ([facility] thru [facility] [employer])  Social Drivers of Health Review SDOH reviewed no interventions necessary  Transition of care needs no transition of care needs at this time

## 2024-08-15 NOTE — Assessment & Plan Note (Signed)
 Pt to fill and take antinbiotics also prescribed 4 days ago

## 2024-08-15 NOTE — Progress Notes (Signed)
 "  [name]     MRN: [id number]      DOB: [birth year]  Chief Complaint  Patient presents with   Follow-up   Ear Pain    Right ear pain. Was seen on 1/9 and given med but she hasn't picked it up yet because when she went to the pharmacy, they were having power issues    HPI [name] is here with above complaint  ROS Denies recent fever or chills. Denies sinus pressure, nasal congestion,  sore throat. Denies chest congestion, productive cough or wheezing. Denies chest pains, palpitations and leg swelling Denies abdominal pain, nausea, vomiting,diarrhea or constipation.   Denies dysuria, frequency, hesitancy or incontinence. Denies skin break down or rash.   PE  BP (!) 154/90   Pulse 76   Resp 16   Ht 5' 2 (1.575 m)   Wt 200 lb 12.8 oz (91.1 kg)   SpO2 99%   BMI 36.73 kg/m   Patient alert and oriented and in no cardiopulmonary distress.  HEENT: No facial asymmetry, EOMI,     Neck supple .  Chest: Clear to auscultation bilaterally.  CVS: S1, S2 no murmurs, no S3.Regular rate.  ABD: Soft non tender.   Ext: No edema  MS: Adequate ROM spine, shoulders, hips and knees.  Skin: Intact, no ulcerations or rash noted.  Psych: Good eye contact, normal affect. Memory intact not anxious or depressed appearing.  CNS: CN 2-12 intact, power,  normal throughout.no focal deficits noted.   Assessment & Plan  Morbid obesity [facility])  Patient re-educated about  the importance of commitment to a  minimum of 150 minutes of exercise per week as able.  The importance of healthy food choices with portion control discussed, as well as eating regularly and within a 12 hour window most days. The need to choose clean , green food 50 to 75% of the time is discussed, as well as to make water the primary drink and set a goal of 64 ounces water daily.       08/13/2024   11:15 AM 08/05/2024    2:00 PM 08/03/2024    1:48 PM  Weight /BMI  Weight 200 lb 200 lb 200 lb 12.8 oz   Height 5' 2 (1.575 m)  5' 2 (1.575 m)  BMI 36.58 kg/m2 36.58 kg/m2 36.73 kg/m2      Localization-related (focal) (partial) idiopathic epilepsy and epileptic syndromes with seizures of localized onset, intractable, without status epilepticus (HCC) Controlled on current regime followed by Neurology  Right otitis media Pt to fill and take antinbiotics also prescribed 4 days ago  GAD (generalized anxiety disorder) Uncontrolled , therapy recommended and referral to local services in the [place]  sent  Depression, major, single episode, severe (HCC) Neds treatment and therapy , info provided for pt call for therapy  Cigarette smoker Asked:confirms currently smokes cigarettes Assess: Unwilling to set a quit date not actively trying to quit currently Advise: needs to QUIT to reduce risk of cancer, cardio and cerebrovascular disease Assist: counseled for 5 minutes and literature provided Arrange: follow up in 2 to 4 months   Elevated blood pressure reading without diagnosis of hypertension If ellevated at return visit will start medication DASH diet and commitment to daily physical activity for a minimum of 30 minutes discussed and encouraged, as a part of hypertension management. The importance of attaining a healthy weight is also discussed.     08/15/2024    3:34 PM 08/15/2024    6:20  AM 08/15/2024    4:12 AM 08/14/2024    7:33 PM 08/14/2024    1:48 PM 08/14/2024    3:28 AM 08/13/2024   11:56 PM  BP/Weight  Systolic BP 136 159 143 145 130 129 148  Diastolic BP 74 90 96 90 73 87 84      "

## 2024-08-15 NOTE — Addendum Note (Signed)
 Addended by: ANTONETTA ROLLENE BRAVO on: 08/15/2024 05:36 PM   Modules accepted: Orders

## 2024-08-15 NOTE — Plan of Care (Signed)
  Problem: Education: Goal: Knowledge of General Education information will improve Description: Including pain rating scale, medication(s)/side effects and non-pharmacologic comfort measures Outcome: Progressing   Problem: Health Behavior/Discharge Planning: Goal: Ability to manage health-related needs will improve Outcome: Progressing   Problem: Clinical Measurements: Goal: Ability to maintain clinical measurements within normal limits will improve Outcome: Progressing Goal: Will remain free from infection Outcome: Progressing Goal: Diagnostic test results will improve Outcome: Progressing Goal: Respiratory complications will improve Outcome: Progressing Goal: Cardiovascular complication will be avoided Outcome: Progressing   Problem: Activity: Goal: Risk for activity intolerance will decrease Outcome: Progressing   Problem: Nutrition: Goal: Adequate nutrition will be maintained Outcome: Progressing   Problem: Coping: Goal: Level of anxiety will decrease Outcome: Progressing   Problem: Elimination: Goal: Will not experience complications related to bowel motility Outcome: Progressing Goal: Will not experience complications related to urinary retention Outcome: Progressing   Problem: Pain Managment: Goal: General experience of comfort will improve and/or be controlled Outcome: Progressing   Problem: Safety: Goal: Ability to remain free from injury will improve Outcome: Progressing   Problem: Skin Integrity: Goal: Risk for impaired skin integrity will decrease Outcome: Progressing   Problem: Activity: Goal: Ability to tolerate increased activity will improve Outcome: Progressing   Problem: Clinical Measurements: Goal: Ability to maintain [place] body temperature in the normal range will improve Outcome: Progressing   Problem: Respiratory: Goal: Ability to maintain adequate ventilation will improve Outcome: Progressing Goal: Ability to maintain Treyvion clear airway  will improve Outcome: Progressing   Problem: Education: Goal: Knowledge of General Education information will improve Description: Including pain rating scale, medication(s)/side effects and non-pharmacologic comfort measures Outcome: Progressing   Problem: Health Behavior/Discharge Planning: Goal: Ability to manage health-related needs will improve Outcome: Progressing   Problem: Clinical Measurements: Goal: Ability to maintain clinical measurements within normal limits will improve Outcome: Progressing Goal: Will remain free from infection Outcome: Progressing Goal: Diagnostic test results will improve Outcome: Progressing Goal: Respiratory complications will improve Outcome: Progressing Goal: Cardiovascular complication will be avoided Outcome: Progressing   Problem: Activity: Goal: Risk for activity intolerance will decrease Outcome: Progressing   Problem: Nutrition: Goal: Adequate nutrition will be maintained Outcome: Progressing   Problem: Coping: Goal: Level of anxiety will decrease Outcome: Progressing   Problem: Elimination: Goal: Will not experience complications related to bowel motility Outcome: Progressing Goal: Will not experience complications related to urinary retention Outcome: Progressing   Problem: Pain Managment: Goal: General experience of comfort will improve and/or be controlled Outcome: Progressing   Problem: Safety: Goal: Ability to remain free from injury will improve Outcome: Progressing   Problem: Skin Integrity: Goal: Risk for impaired skin integrity will decrease Outcome: Progressing   Problem: Activity: Goal: Ability to tolerate increased activity will improve Outcome: Progressing   Problem: Clinical Measurements: Goal: Ability to maintain [place] body temperature in the normal range will improve Outcome: Progressing   Problem: Respiratory: Goal: Ability to maintain adequate ventilation will improve Outcome: Progressing Goal:  Ability to maintain Toren clear airway will improve Outcome: Progressing

## 2024-08-15 NOTE — Assessment & Plan Note (Signed)
 Controlled on current regime followed by Neurology

## 2024-08-15 NOTE — Assessment & Plan Note (Signed)
 Asked:confirms currently smokes cigarettes Assess: Unwilling to set a quit date not actively trying to quit currently Advise: needs to QUIT to reduce risk of cancer, cardio and cerebrovascular disease Assist: counseled for 5 minutes and literature provided Arrange: follow up in 2 to 4 months

## 2024-08-15 NOTE — Assessment & Plan Note (Signed)
 If ellevated at return visit will start medication DASH diet and commitment to daily physical activity for a minimum of 30 minutes discussed and encouraged, as a part of hypertension management. The importance of attaining a healthy weight is also discussed.     08/15/2024    3:34 PM 08/15/2024    6:20 AM 08/15/2024    4:12 AM 08/14/2024    7:33 PM 08/14/2024    1:48 PM 08/14/2024    3:28 AM 08/13/2024   11:56 PM  BP/Weight  Systolic BP 136 159 143 145 130 129 148  Diastolic BP 74 90 96 90 73 87 84

## 2024-08-15 NOTE — Progress Notes (Signed)
 " PROGRESS NOTE    [name]  FMW:[id number] DOB: 10/29/1980 DOA: 08/13/2024 PCP: Antonetta Rollene BRAVO, MD   Brief Narrative:   [name] is a 44 y.o. female with medical history significant of disorder migraines who comes to the ER with chief complaint of ongoing shortness of breath and cough, chills.  She was evaluated in the ER on 1/15 and diagnosed with influenza.  She took Tamiflu  but continued to have symptoms prompting her to come to the ER.  She states she might have had fever.  Reports chills.  Denies any chest pain.  Feels fatigued.   ED Course: Vital signs stable in the ER.  She was mildly tachypneic and borderline tachycardic.  She is requiring some supplemental oxygen .  Blood pressure stable.  Laboratory work notable for leukocytosis 12, lactic acid elevated 2.6, D-dimer 1.6, sodium 133, potassium 3.4, troponin negative.  CT angiogram PE protocol revealed diffuse opacities in both lung consistent with multifocal pneumonia, no evidence of PE.  Patient is admitted for hypoxia/sepsis secondary to pneumonia   Assessment & Plan:   Principal Problem:   Sepsis due to pneumonia [facility]) Active Problems:   Cigarette smoker   Depression   Seizure (HCC)   GAD (generalized anxiety disorder)   Morbid obesity (HCC)   Acute hypoxemic respiratory failure (HCC)   Migraine headache   44 year old female with sepsis/hypoxia secondary to diffuse multifocal pneumonia.     Multifocal pneumonia, acute hypoxic respiratory failure Severe sepsis Recent influenza A   - Remains stable but he still feels poorly and has coarse crackles bilaterally. Remains on Supplemental oxygen  3 L/min today.  Will continue Rocephin  and doxycycline , DC vancomycin  as MRSA PCR negative.  Consider repeat imaging -proCalcitonin 0.20 -Check MRSA PCR Follow-up urine streptococcal antigen, Legionella antigen Mucolytics, supportive care, albuterol  as needed Obtain sputum culture if able Follow-up on blood  culture, negative so far   History of seizures, migraines: Resume home medications-continue brivaracetam , buspirone , Trileptal , nortriptyline , pregabalin , zonisamide    Recent influenza: Completed Tamiflu    DVT prophylaxis: Lovenox  Code Status: Full code Family Communication:   Disposition Plan: Telemetry Consults called:  Admission status: Inpatient   Severity of Illness: The appropriate patient status for this patient is INPATIENT. Inpatient status is judged to be reasonable and necessary in order to provide the required intensity of service to ensure the patient's safety. The patient's presenting symptoms, physical exam findings, and initial radiographic and laboratory data in the context of their chronic comorbidities is felt to place them at high risk for further clinical deterioration. Furthermore, it is not anticipated that the patient will be medically stable for discharge from the hospital within 2 midnights of admission.    * I certify that at the point of admission it is my clinical judgment that the patient will require inpatient hospital care spanning beyond 2 midnights from the point of admission due to high intensity of service, high risk for further deterioration and high frequency of surveillance require  Subjective:  Continues to have cough, no fever.  Slow improvement.  Some shortness of breath.  Slightly better than yesterday.  Objective: Vitals:   08/15/24 0620 08/15/24 0650 08/15/24 1454 08/15/24 1534  BP: (!) 159/90   136/74  Pulse:    94  Resp:    17  Temp:    99.2 F (37.3 C)  TempSrc:    Oral  SpO2:  95% 99% 96%  Weight:      Height:  Intake/Output Summary (Last 24 hours) at 08/15/2024 1757 Last data filed at 08/15/2024 0500 Gross per 24 hour  Intake 1750.44 ml  Output --  Net 1750.44 ml   Filed Weights   08/13/24 1115  Weight: 90.7 kg    Examination:  General: Alert, oriented HEENT: Moist oral mucosa Chest: Bilateral coarse  crackles,no wheezing CVs: S1, S2, no murmur, regular rhythm Abdomen: Soft, nontender    Data Reviewed: I have personally reviewed following labs and imaging studies  CBC: Recent Labs  Lab 08/13/24 1130 08/14/24 0402  WBC 12.7* 11.8*  NEUTROABS 10.8*  --   HGB 11.3* 10.2*  HCT 35.7* 31.7*  MCV 88.1 87.6  PLT 470* 447*   Basic Metabolic Panel: Recent Labs  Lab 08/13/24 1130 08/14/24 0402  NA 133* 134*  K 3.4* 4.3  CL 98 109  CO2 19* 18*  GLUCOSE 93 92  BUN <5* 8  CREATININE 0.88 0.76  CALCIUM  8.8* 8.9   GFR: Estimated Creatinine Clearance: 92.9 mL/min (by C-G formula based on SCr of 0.76 mg/dL). Liver Function Tests: Recent Labs  Lab 08/13/24 1130  AST 17  ALT 12  ALKPHOS 158*  BILITOT 0.5  PROT 8.1  ALBUMIN 3.9   No results for input(s): LIPASE, AMYLASE in the last 168 hours. No results for input(s): AMMONIA  in the last 168 hours. Coagulation Profile: No results for input(s): INR, PROTIME in the last 168 hours. Cardiac Enzymes: No results for input(s): CKTOTAL, CKMB, CKMBINDEX, TROPONINI in the last 168 hours. BNP (last 3 results) Recent Labs    08/13/24 1351  PROBNP 230.0   HbA1C: No results for input(s): HGBA1C in the last 72 hours. CBG: No results for input(s): GLUCAP in the last 168 hours. Lipid Profile: No results for input(s): CHOL, HDL, LDLCALC, TRIG, CHOLHDL, LDLDIRECT in the last 72 hours. Thyroid  Function Tests: No results for input(s): TSH, T4TOTAL, FREET4, T3FREE, THYROIDAB in the last 72 hours. Anemia Panel: No results for input(s): VITAMINB12, FOLATE, FERRITIN, TIBC, IRON, RETICCTPCT in the last 72 hours. Sepsis Labs: Recent Labs  Lab 08/13/24 1308 08/13/24 1512  PROCALCITON  --  0.20  LATICACIDVEN 2.6* 1.2    Recent Results (from the past 240 hours)  Culture, blood (routine x 2)     Status: None (Preliminary result)   Collection Time: 08/13/24  1:08 PM   Specimen:  BLOOD  Result Value Ref Range Status   Specimen Description BLOOD RIGHT ASSIST CONTROL  Final   Special Requests   Final    BOTTLES DRAWN AEROBIC AND ANAEROBIC Blood Culture adequate volume   Culture   Final    NO GROWTH 2 DAYS Performed at [facility], [address] [place], [place] [address]    Report Status PENDING  Incomplete  Culture, blood (routine x 2)     Status: None (Preliminary result)   Collection Time: 08/13/24  1:51 PM   Specimen: BLOOD LEFT ARM  Result Value Ref Range Status   Specimen Description BLOOD LEFT ARM  Final   Special Requests   Final    BOTTLES DRAWN AEROBIC AND ANAEROBIC Blood Culture adequate volume   Culture   Final    NO GROWTH 2 DAYS Performed at [facility], [address], [place], [place] [address]    Report Status PENDING  Incomplete  MRSA Next Gen by PCR, Nasal     Status: None   Collection Time: 08/14/24  9:55 PM   Specimen: Nasal Mucosa; Nasal Swab  Result Value Ref Range Status  MRSA by PCR Next Gen NOT DETECTED NOT DETECTED Final    Comment: (NOTE) The GeneXpert MRSA Assay (FDA approved for NASAL specimens only), is one component of a comprehensive MRSA colonization surveillance program. It is not intended to diagnose MRSA infection nor to guide or monitor treatment for MRSA infections. Test performance is not FDA approved in patients less than 8 years old. Performed at [facility], [address] [place], [place] [address]          Radiology Studies: No results found.       Scheduled Meds:  acyclovir   400 mg Oral BID   aspirin   81 mg Oral Daily   atorvastatin   40 mg Oral Daily   brivaracetam   100 mg Oral BID   busPIRone   7.5 mg Oral TID   doxycycline   100 mg Oral Q12H   enoxaparin  (LOVENOX ) injection  40 mg Subcutaneous Q24H   guaiFENesin   600 mg Oral BID   ipratropium-albuterol   3 mL Nebulization TID   nortriptyline   50 mg Oral QHS   OXcarbazepine   600 mg Oral BID   pregabalin   100 mg Oral Daily   zonisamide    100 mg Oral Daily   zonisamide   500 mg Oral QPM   Continuous Infusions:  cefTRIAXone  (ROCEPHIN )  IV 2 g (08/15/24 1026)          Derryl Duval, MD [facility] 08/15/2024, 5:57 PM   "

## 2024-08-16 ENCOUNTER — Inpatient Hospital Stay (HOSPITAL_COMMUNITY)

## 2024-08-16 ENCOUNTER — Ambulatory Visit (HOSPITAL_COMMUNITY)

## 2024-08-16 DIAGNOSIS — J189 Pneumonia, unspecified organism: Secondary | ICD-10-CM | POA: Diagnosis not present

## 2024-08-16 DIAGNOSIS — E8729 Other acidosis: Secondary | ICD-10-CM | POA: Diagnosis present

## 2024-08-16 DIAGNOSIS — A419 Sepsis, unspecified organism: Secondary | ICD-10-CM | POA: Diagnosis not present

## 2024-08-16 DIAGNOSIS — E66812 Obesity, class 2: Secondary | ICD-10-CM | POA: Diagnosis present

## 2024-08-16 MED ORDER — FUROSEMIDE 10 MG/ML IJ SOLN
20.0000 mg | Freq: Once | INTRAMUSCULAR | Status: AC
  Administered 2024-08-16: 20 mg via INTRAVENOUS
  Filled 2024-08-16: qty 2

## 2024-08-16 NOTE — Progress Notes (Addendum)
 " PROGRESS NOTE    [name]  FMW:[id number] DOB: 08-30-1980 DOA: 08/13/2024 PCP: Antonetta Rollene BRAVO, MD   Brief Narrative:   DONESHA Levine is a 44 y.o. female with medical history significant of disorder migraines who comes to the ER with chief complaint of ongoing shortness of breath and cough, chills.  She was evaluated in the ER on 1/15 and diagnosed with influenza.  She took Tamiflu  but continued to have symptoms prompting her to come to the ER.  She states she might have had fever.  Reports chills.  Denies any chest pain.  Feels fatigued.   ED Course: Vital signs stable in the ER.  She was mildly tachypneic and borderline tachycardic.  She is requiring some supplemental oxygen .  Blood pressure stable.  Laboratory work notable for leukocytosis 12, lactic acid elevated 2.6, D-dimer 1.6, sodium 133, potassium 3.4, troponin negative.  CT angiogram PE protocol revealed diffuse opacities in both lung consistent with multifocal pneumonia, no evidence of PE.  Patient is admitted for hypoxia/sepsis secondary to pneumonia   Assessment & Plan:   Principal Problem:   Sepsis due to pneumonia [facility]) Active Problems:   Cigarette smoker   Depression   Seizure (HCC)   GAD (generalized anxiety disorder)   Morbid obesity (HCC)   Acute hypoxemic respiratory failure (HCC)   Migraine headache   Obesity, class 2   High anion gap metabolic acidosis   44 year old female with sepsis/hypoxia secondary to diffuse multifocal pneumonia.     Multifocal pneumonia, acute hypoxic respiratory failure Severe sepsis Recent influenza A   - Slowly improving, Remains on Supplemental oxygen  2 L/min today.  Coarse crackles bilaterally improving on exam.  Will continue Rocephin  and doxycycline , DC vancomycin  as MRSA PCR negative.   -proCalcitonin 0.20 -Check MRSA PCR Mucolytics, supportive care, albuterol  as needed Follow-up on blood culture, negative so far Incentive spirometry, encourage ambulation in  the hallways - Will give Lasix  20 mg IV x 1 -Follow-up chest x-ray today: Improving infiltrates.  Mild AGMA- monitor   History of seizures, migraines: Resume home medications-continue brivaracetam , buspirone , Trileptal , nortriptyline , pregabalin , zonisamide    Recent influenza: Completed Tamiflu    DVT prophylaxis: Lovenox  Code Status: Full code Family Communication:   Disposition Plan: Anticipate discharge in next 24 to 48 hours.   Consults called:  Admission status: Inpatient   Severity of Illness: The appropriate patient status for this patient is INPATIENT. Inpatient status is judged to be reasonable and necessary in order to provide the required intensity of service to ensure the patient's safety. The patient's presenting symptoms, physical exam findings, and initial radiographic and laboratory data in the context of their chronic comorbidities is felt to place them at high risk for further clinical deterioration. Furthermore, it is not anticipated that the patient will be medically stable for discharge from the hospital within 2 midnights of admission.    * I certify that at the point of admission it is my clinical judgment that the patient will require inpatient hospital care spanning beyond 2 midnights from the point of admission due to high intensity of service, high risk for further deterioration and high frequency of surveillance require  Subjective:  Continues to have cough, no fever.  Slow improvement.  Some shortness of breath.  Supplemental oxygen  down to 2 L/min.  Getting better slowly.   still having significant coarse crackles on exam  Objective: Vitals:   08/15/24 2049 08/16/24 0502 08/16/24 0753 08/16/24 1300  BP:  (!) 153/89  (!) 147/86  Pulse:  91  95  Resp:  18  18  Temp:  98.1 F (36.7 C)  98 F (36.7 C)  TempSrc:  Oral  Oral  SpO2: 97% 95% 97% 100%  Weight:      Height:        Intake/Output Summary (Last 24 hours) at 08/16/2024 1627 Last data filed at  08/15/2024 1852 Gross per 24 hour  Intake 120 ml  Output --  Net 120 ml   Filed Weights   08/13/24 1115  Weight: 90.7 kg    Examination:  General: Alert, oriented HEENT: Moist oral mucosa Chest: Bilateral coarse crackles,no wheezing CVs: S1, S2, no murmur, regular rhythm Abdomen: Soft, nontender    Data Reviewed: I have personally reviewed following labs and imaging studies  CBC: Recent Labs  Lab 08/13/24 1130 08/14/24 0402  WBC 12.7* 11.8*  NEUTROABS 10.8*  --   HGB 11.3* 10.2*  HCT 35.7* 31.7*  MCV 88.1 87.6  PLT 470* 447*   Basic Metabolic Panel: Recent Labs  Lab 08/13/24 1130 08/14/24 0402  NA 133* 134*  K 3.4* 4.3  CL 98 109  CO2 19* 18*  GLUCOSE 93 92  BUN <5* 8  CREATININE 0.88 0.76  CALCIUM  8.8* 8.9   GFR: Estimated Creatinine Clearance: 92.9 mL/min (by C-G formula based on SCr of 0.76 mg/dL). Liver Function Tests: Recent Labs  Lab 08/13/24 1130  AST 17  ALT 12  ALKPHOS 158*  BILITOT 0.5  PROT 8.1  ALBUMIN 3.9   No results for input(s): LIPASE, AMYLASE in the last 168 hours. No results for input(s): AMMONIA  in the last 168 hours. Coagulation Profile: No results for input(s): INR, PROTIME in the last 168 hours. Cardiac Enzymes: No results for input(s): CKTOTAL, CKMB, CKMBINDEX, TROPONINI in the last 168 hours. BNP (last 3 results) Recent Labs    08/13/24 1351  PROBNP 230.0   HbA1C: No results for input(s): HGBA1C in the last 72 hours. CBG: No results for input(s): GLUCAP in the last 168 hours. Lipid Profile: No results for input(s): CHOL, HDL, LDLCALC, TRIG, CHOLHDL, LDLDIRECT in the last 72 hours. Thyroid  Function Tests: No results for input(s): TSH, T4TOTAL, FREET4, T3FREE, THYROIDAB in the last 72 hours. Anemia Panel: No results for input(s): VITAMINB12, FOLATE, FERRITIN, TIBC, IRON, RETICCTPCT in the last 72 hours. Sepsis Labs: Recent Labs  Lab 08/13/24 1308  08/13/24 1512  PROCALCITON  --  0.20  LATICACIDVEN 2.6* 1.2    Recent Results (from the past 240 hours)  Culture, blood (routine x 2)     Status: None (Preliminary result)   Collection Time: 08/13/24  1:08 PM   Specimen: BLOOD  Result Value Ref Range Status   Specimen Description BLOOD RIGHT ASSIST CONTROL  Final   Special Requests   Final    BOTTLES DRAWN AEROBIC AND ANAEROBIC Blood Culture adequate volume   Culture   Final    NO GROWTH 3 DAYS Performed at [facility], [address], [place], [place] [address]    Report Status PENDING  Incomplete  Culture, blood (routine x 2)     Status: None (Preliminary result)   Collection Time: 08/13/24  1:51 PM   Specimen: BLOOD LEFT ARM  Result Value Ref Range Status   Specimen Description BLOOD LEFT ARM  Final   Special Requests   Final    BOTTLES DRAWN AEROBIC AND ANAEROBIC Blood Culture adequate volume   Culture   Final    NO GROWTH 3 DAYS Performed at [facility], [address] [place], [place] [address]    Report Status PENDING  Incomplete  MRSA Next Gen by PCR, Nasal     Status: None   Collection Time: 08/14/24  9:55 PM   Specimen: Nasal Mucosa; Nasal Swab  Result Value Ref Range Status   MRSA by PCR Next Gen NOT DETECTED NOT DETECTED Final    Comment: (NOTE) The GeneXpert MRSA Assay (FDA approved for NASAL specimens only), is one component of a comprehensive MRSA colonization surveillance program. It is not intended to diagnose MRSA infection nor to guide or monitor treatment for MRSA infections. Test performance is not FDA approved in patients less than 60 years old. Performed at [facility], [address] [place], [place] [address]          Radiology Studies: DG CHEST PORT 1 VIEW Result Date: 08/16/2024 CLINICAL DATA:  Pneumonia, cough, short of breath EXAM: PORTABLE CHEST 1 VIEW COMPARISON:  Chest x-ray and CT scan of the chest 08/13/2024 FINDINGS: Stable mild cardiomegaly. Decreased pulmonary vascular  congestion. Persistent patchy foci of airspace opacity bilaterally consistent with the clinical history of multifocal pneumonia. No pneumothorax or effusion. No acute osseous abnormality. IMPRESSION: 1. Stable cardiomegaly with decreased pulmonary vascular congestion. 2. Persistent but slightly less conspicuous multifocal patchy airspace opacities consistent with the clinical history of multifocal pneumonia. 3. No new process or complicating feature identified. Electronically Signed   By: Wilkie Lent M.D.   On: 08/16/2024 11:05         Scheduled Meds:  acyclovir   400 mg Oral BID   aspirin   81 mg Oral Daily   atorvastatin   40 mg Oral Daily   brivaracetam   100 mg Oral BID   busPIRone   7.5 mg Oral TID   doxycycline   100 mg Oral Q12H   enoxaparin  (LOVENOX ) injection  40 mg Subcutaneous Q24H   guaiFENesin   600 mg Oral BID   ipratropium-albuterol   3 mL Nebulization TID   nortriptyline   50 mg Oral QHS   OXcarbazepine   600 mg Oral BID   pregabalin   100 mg Oral Daily   zonisamide   100 mg Oral Daily   zonisamide   500 mg Oral QPM   Continuous Infusions:  cefTRIAXone  (ROCEPHIN )  IV 2 g (08/16/24 0859)          Derryl Duval, MD [facility] 08/16/2024, 4:27 PM   "

## 2024-08-16 NOTE — Plan of Care (Signed)

## 2024-08-17 ENCOUNTER — Encounter (INDEPENDENT_AMBULATORY_CARE_PROVIDER_SITE_OTHER): Payer: Self-pay | Admitting: *Deleted

## 2024-08-17 ENCOUNTER — Telehealth: Payer: Self-pay | Admitting: Neurology

## 2024-08-17 NOTE — Telephone Encounter (Signed)
 Left message with the after hour service on 08-17-24 at 9:02   Patient has questions about medication that she maybe on. She wants to know if she si on medication that makes her shake she is currently in the hospital

## 2024-08-17 NOTE — Telephone Encounter (Signed)
 Pt called again for same issue

## 2024-08-17 NOTE — Telephone Encounter (Unsigned)
 Copied from CRM #[id number]. Topic: General - Other >> Aug 16, 2024  3:48 PM Antony RAMAN wrote: Reason for CRM: patient wanted to advise doctor she has pnemonia

## 2024-08-17 NOTE — Plan of Care (Signed)
   Problem: Health Behavior/Discharge Planning: Goal: Ability to manage health-related needs will improve Outcome: Progressing   Problem: Activity: Goal: Risk for activity intolerance will decrease Outcome: Progressing

## 2024-08-17 NOTE — Progress Notes (Signed)
 Mobility Specialist Progress Note:    08/17/24 1000  Mobility  Activity Ambulated with assistance  Level of Assistance Independent  Assistive Device None  Distance Ambulated (ft) 120 ft  Range of Motion/Exercises Active;All extremities  Activity Response Tolerated well  Mobility Referral Yes  Mobility visit 1 Mobility  Mobility Specialist Start Time (ACUTE ONLY) 1000  Mobility Specialist Stop Time (ACUTE ONLY) 1020  Mobility Specialist Time Calculation (min) (ACUTE ONLY) 20 min   Pt received in bed, agreeable to mobility. Independently able to stand and ambulate with no AD. Tolerated well, denies SOB and some audible wheezing. SpO2 94% on RA at rest, during ambulation SpO2 92% on RA. Returned supine, all needs met.  Shawnika Pepin Mobility Specialist Please contact via Special Educational Needs Teacher or  Rehab office at [phone]

## 2024-08-17 NOTE — Progress Notes (Signed)
 " PROGRESS NOTE    [name]  FMW:[id number] DOB: 11-27-1980 DOA: 08/13/2024 PCP: Antonetta Rollene BRAVO, MD   Brief Narrative:   [name] is a 44 y.o. female with medical history significant of disorder migraines who comes to the ER with chief complaint of ongoing shortness of breath and cough, chills.  She was evaluated in the ER on 1/15 and diagnosed with influenza.  She took Tamiflu  but continued to have symptoms prompting her to come to the ER.  She states she might have had fever.  Reports chills.  Denies any chest pain.  Feels fatigued.   ED Course: Vital signs stable in the ER.  She was mildly tachypneic and borderline tachycardic.  She is requiring some supplemental oxygen .  Blood pressure stable.  Laboratory work notable for leukocytosis 12, lactic acid elevated 2.6, D-dimer 1.6, sodium 133, potassium 3.4, troponin negative.  CT angiogram PE protocol revealed diffuse opacities in both lung consistent with multifocal pneumonia, no evidence of PE.  Patient is admitted for hypoxia/sepsis secondary to pneumonia   Assessment & Plan:   Principal Problem:   Sepsis due to pneumonia [facility]) Active Problems:   Cigarette smoker   Depression   Seizure (HCC)   GAD (generalized anxiety disorder)   Morbid obesity (HCC)   Acute hypoxemic respiratory failure (HCC)   Migraine headache   Obesity, class 2   High anion gap metabolic acidosis   44 year old female with sepsis/hypoxia secondary to diffuse multifocal pneumonia.     Multifocal pneumonia, acute hypoxic respiratory failure Severe sepsis Recent influenza A   - Slowly improving, Remains on Supplemental oxygen , down to 1.5 L/min today.  Coarse crackles bilaterally improving on exam.  Will continue Rocephin  and doxycycline  -proCalcitonin 0.20 Mucolytics, supportive care,sch duoneb Follow-up on blood culture, negative so far Incentive spirometry, encourage ambulation in the hallways -Follow-up chest x-ray 1/26:  Improving  infiltrates.  Mild AGMA- improved   History of seizures, migraines: Resume home medications-continue brivaracetam , buspirone , Trileptal , nortriptyline , pregabalin , zonisamide    Recent influenza: Completed Tamiflu    DVT prophylaxis: Lovenox  Code Status: Full code Family Communication:   Disposition Plan: Anticipate discharge in next 24 to 48 hours.   Consults called:  Admission status: Inpatient   Severity of Illness: The appropriate patient status for this patient is INPATIENT. Inpatient status is judged to be reasonable and necessary in order to provide the required intensity of service to ensure the patient's safety. The patient's presenting symptoms, physical exam findings, and initial radiographic and laboratory data in the context of their chronic comorbidities is felt to place them at high risk for further clinical deterioration. Furthermore, it is not anticipated that the patient will be medically stable for discharge from the hospital within 2 midnights of admission.    * I certify that at the point of admission it is my clinical judgment that the patient will require inpatient hospital care spanning beyond 2 midnights from the point of admission due to high intensity of service, high risk for further deterioration and high frequency of surveillance require  Subjective:  Continues to have cough, no fever.  Slow improvement.  Some shortness of breath.  Supplemental oxygen  down to 1.5 L/min.  Getting better slowly.   still having coarse crackles on exam.    Objective: Vitals:   08/16/24 1300 08/16/24 1948 08/17/24 0357 08/17/24 0802  BP: (!) 147/86 127/83 123/77   Pulse: 95 (!) 104 84   Resp: 18 18 18    Temp: 98 F (36.7 C)  98 F (36.7 C) 98.4 F (36.9 C)   TempSrc: Oral Oral Oral   SpO2: 100% 97% 96% 90%  Weight:      Height:        Intake/Output Summary (Last 24 hours) at 08/17/2024 1314 Last data filed at 08/16/2024 1752 Gross per 24 hour  Intake 240 ml  Output --   Net 240 ml   Filed Weights   08/13/24 1115  Weight: 90.7 kg    Examination:  General: Alert, oriented HEENT: Moist oral mucosa Chest: Bilateral coarse crackles,no wheezing CVs: S1, S2, no murmur, regular rhythm Abdomen: Soft, nontender    Data Reviewed: I have personally reviewed following labs and imaging studies  CBC: Recent Labs  Lab 08/13/24 1130 08/14/24 0402  WBC 12.7* 11.8*  NEUTROABS 10.8*  --   HGB 11.3* 10.2*  HCT 35.7* 31.7*  MCV 88.1 87.6  PLT 470* 447*   Basic Metabolic Panel: Recent Labs  Lab 08/13/24 1130 08/14/24 0402  NA 133* 134*  K 3.4* 4.3  CL 98 109  CO2 19* 18*  GLUCOSE 93 92  BUN <5* 8  CREATININE 0.88 0.76  CALCIUM  8.8* 8.9   GFR: Estimated Creatinine Clearance: 92.9 mL/min (by C-G formula based on SCr of 0.76 mg/dL). Liver Function Tests: Recent Labs  Lab 08/13/24 1130  AST 17  ALT 12  ALKPHOS 158*  BILITOT 0.5  PROT 8.1  ALBUMIN 3.9   No results for input(s): LIPASE, AMYLASE in the last 168 hours. No results for input(s): AMMONIA  in the last 168 hours. Coagulation Profile: No results for input(s): INR, PROTIME in the last 168 hours. Cardiac Enzymes: No results for input(s): CKTOTAL, CKMB, CKMBINDEX, TROPONINI in the last 168 hours. BNP (last 3 results) Recent Labs    08/13/24 1351  PROBNP 230.0   HbA1C: No results for input(s): HGBA1C in the last 72 hours. CBG: No results for input(s): GLUCAP in the last 168 hours. Lipid Profile: No results for input(s): CHOL, HDL, LDLCALC, TRIG, CHOLHDL, LDLDIRECT in the last 72 hours. Thyroid  Function Tests: No results for input(s): TSH, T4TOTAL, FREET4, T3FREE, THYROIDAB in the last 72 hours. Anemia Panel: No results for input(s): VITAMINB12, FOLATE, FERRITIN, TIBC, IRON, RETICCTPCT in the last 72 hours. Sepsis Labs: Recent Labs  Lab 08/13/24 1308 08/13/24 1512  PROCALCITON  --  0.20  LATICACIDVEN 2.6* 1.2     Recent Results (from the past 240 hours)  Culture, blood (routine x 2)     Status: None (Preliminary result)   Collection Time: 08/13/24  1:08 PM   Specimen: BLOOD  Result Value Ref Range Status   Specimen Description BLOOD RIGHT ASSIST CONTROL  Final   Special Requests   Final    BOTTLES DRAWN AEROBIC AND ANAEROBIC Blood Culture adequate volume   Culture   Final    NO GROWTH 4 DAYS Performed at [facility], [address], [place], [place] [address]    Report Status PENDING  Incomplete  Culture, blood (routine x 2)     Status: None (Preliminary result)   Collection Time: 08/13/24  1:51 PM   Specimen: BLOOD LEFT ARM  Result Value Ref Range Status   Specimen Description BLOOD LEFT ARM  Final   Special Requests   Final    BOTTLES DRAWN AEROBIC AND ANAEROBIC Blood Culture adequate volume   Culture   Final    NO GROWTH 4 DAYS Performed at [facility], [address] [place], [place] [address]    Report Status PENDING  Incomplete  MRSA Next Gen by PCR, Nasal     Status: None   Collection Time: 08/14/24  9:55 PM   Specimen: Nasal Mucosa; Nasal Swab  Result Value Ref Range Status   MRSA by PCR Next Gen NOT DETECTED NOT DETECTED Final    Comment: (NOTE) The GeneXpert MRSA Assay (FDA approved for NASAL specimens only), is one component of a comprehensive MRSA colonization surveillance program. It is not intended to diagnose MRSA infection nor to guide or monitor treatment for MRSA infections. Test performance is not FDA approved in patients less than 69 years old. Performed at [facility], [address], [place], [place] [address]          Radiology Studies: DG CHEST PORT 1 VIEW Result Date: 08/16/2024 CLINICAL DATA:  Pneumonia, cough, short of breath EXAM: PORTABLE CHEST 1 VIEW COMPARISON:  Chest x-ray and CT scan of the chest 08/13/2024 FINDINGS: Stable mild cardiomegaly. Decreased pulmonary vascular congestion. Persistent patchy foci of airspace opacity bilaterally  consistent with the clinical history of multifocal pneumonia. No pneumothorax or effusion. No acute osseous abnormality. IMPRESSION: 1. Stable cardiomegaly with decreased pulmonary vascular congestion. 2. Persistent but slightly less conspicuous multifocal patchy airspace opacities consistent with the clinical history of multifocal pneumonia. 3. No new process or complicating feature identified. Electronically Signed   By: Wilkie Lent M.D.   On: 08/16/2024 11:05         Scheduled Meds:  acyclovir   400 mg Oral BID   aspirin   81 mg Oral Daily   atorvastatin   40 mg Oral Daily   brivaracetam   100 mg Oral BID   busPIRone   7.5 mg Oral TID   doxycycline   100 mg Oral Q12H   enoxaparin  (LOVENOX ) injection  40 mg Subcutaneous Q24H   guaiFENesin   600 mg Oral BID   ipratropium-albuterol   3 mL Nebulization TID   nortriptyline   50 mg Oral QHS   OXcarbazepine   600 mg Oral BID   pregabalin   100 mg Oral Daily   zonisamide   100 mg Oral Daily   zonisamide   500 mg Oral QPM   Continuous Infusions:  cefTRIAXone  (ROCEPHIN )  IV 2 g (08/17/24 1102)          Derryl Duval, MD [facility] 08/17/2024, 1:14 PM   "

## 2024-08-17 NOTE — Plan of Care (Signed)
   Problem: Education: Goal: Knowledge of General Education information will improve Description Including pain rating scale, medication(s)/side effects and non-pharmacologic comfort measures Outcome: Progressing

## 2024-08-17 NOTE — Telephone Encounter (Signed)
 agree

## 2024-08-17 NOTE — Telephone Encounter (Signed)
 Pt called she is on the hospital she wanted to thank me for telling her to go , she wanted to know if she takes medication to stop her hands from shaking. I told her if she is taken breaking treatments that could cause her to shake, she needs to wait until she is better to see if the shaking gets better, I told her she is where she needs to be.

## 2024-08-18 ENCOUNTER — Telehealth: Payer: Self-pay | Admitting: Family Medicine

## 2024-08-18 ENCOUNTER — Encounter (HOSPITAL_COMMUNITY): Payer: Self-pay | Admitting: Hospitalist

## 2024-08-18 LAB — BASIC METABOLIC PANEL WITH GFR
Anion gap: 13 (ref 5–15)
BUN: <5 mg/dL — ABNORMAL LOW (ref 6–20)
CO2: 21 mmol/L — ABNORMAL LOW (ref 22–32)
Calcium: 8.8 mg/dL — ABNORMAL LOW (ref 8.9–10.3)
Chloride: 99 mmol/L (ref 98–111)
Creatinine, Ser: 0.68 mg/dL (ref 0.44–1.00)
GFR, Estimated: >60 mL/min
Glucose, Bld: 81 mg/dL (ref 70–99)
Potassium: 3.2 mmol/L — ABNORMAL LOW (ref 3.5–5.1)
Sodium: 133 mmol/L — ABNORMAL LOW (ref 135–145)

## 2024-08-18 LAB — CULTURE, BLOOD (ROUTINE X 2)
Culture: NO GROWTH
Culture: NO GROWTH
Special Requests: ADEQUATE
Special Requests: ADEQUATE

## 2024-08-18 LAB — CBC WITH DIFFERENTIAL/PLATELET
Abs Immature Granulocytes: 0.07 10*3/uL (ref 0.00–0.07)
Basophils Absolute: 0 10*3/uL (ref 0.0–0.1)
Basophils Relative: 0 %
Eosinophils Absolute: 0.2 10*3/uL (ref 0.0–0.5)
Eosinophils Relative: 3 %
HCT: 30.6 % — ABNORMAL LOW (ref 36.0–46.0)
Hemoglobin: 10 g/dL — ABNORMAL LOW (ref 12.0–15.0)
Immature Granulocytes: 1 %
Lymphocytes Relative: 29 %
Lymphs Abs: 2 10*3/uL (ref 0.7–4.0)
MCH: 28.4 pg (ref 26.0–34.0)
MCHC: 32.7 g/dL (ref 30.0–36.0)
MCV: 86.9 fL (ref 80.0–100.0)
Monocytes Absolute: 0.5 10*3/uL (ref 0.1–1.0)
Monocytes Relative: 7 %
Neutro Abs: 4.2 10*3/uL (ref 1.7–7.7)
Neutrophils Relative %: 60 %
Platelets: 484 10*3/uL — ABNORMAL HIGH (ref 150–400)
RBC: 3.52 MIL/uL — ABNORMAL LOW (ref 3.87–5.11)
RDW: 16.1 % — ABNORMAL HIGH (ref 11.5–15.5)
WBC: 7 10*3/uL (ref 4.0–10.5)
nRBC: 0 % (ref 0.0–0.2)

## 2024-08-18 LAB — MAGNESIUM: Magnesium: 1.7 mg/dL (ref 1.7–2.4)

## 2024-08-18 MED ORDER — PROMETHAZINE-DM 6.25-15 MG/5ML PO SYRP: 5.0000 mL | ORAL_SOLUTION | Freq: Four times a day (QID) | ORAL | 0 refills | Status: AC | PRN

## 2024-08-18 MED ORDER — ALBUTEROL SULFATE HFA 108 (90 BASE) MCG/ACT IN AERS: 1.0000 | INHALATION_SPRAY | Freq: Four times a day (QID) | RESPIRATORY_TRACT | 3 refills | Status: AC | PRN

## 2024-08-18 MED ORDER — POTASSIUM CHLORIDE CRYS ER 20 MEQ PO TBCR
40.0000 meq | EXTENDED_RELEASE_TABLET | Freq: Once | ORAL | Status: AC
  Administered 2024-08-18: 40 meq via ORAL
  Filled 2024-08-18: qty 2

## 2024-08-18 MED ORDER — DOXYCYCLINE HYCLATE 100 MG PO TABS: 100.0000 mg | ORAL_TABLET | Freq: Two times a day (BID) | ORAL | 0 refills | Status: AC

## 2024-08-18 MED ORDER — GUAIFENESIN ER 600 MG PO TB12: 1200.0000 mg | ORAL_TABLET | Freq: Two times a day (BID) | ORAL | 0 refills | Status: AC

## 2024-08-18 MED ORDER — IPRATROPIUM-ALBUTEROL 0.5-2.5 (3) MG/3ML IN SOLN: 3.0000 mL | Freq: Three times a day (TID) | RESPIRATORY_TRACT | 1 refills | Status: AC

## 2024-08-18 NOTE — Care Management Important Message (Signed)
 Important Message  Patient Details  Name: [name] MRN: [id number] Date of Birth: 1981-07-05   Important Message Given:  Yes - Medicare IM     Treylon Henard L Veta Dambrosia 08/18/2024, 11:13 AM

## 2024-08-18 NOTE — Telephone Encounter (Signed)
 scheduled

## 2024-08-18 NOTE — Discharge Instructions (Signed)
IMPORTANT INFORMATION: PAY CLOSE ATTENTION   PHYSICIAN DISCHARGE INSTRUCTIONS  Follow with Primary care provider  Fayrene Helper, MD  and other consultants as instructed by your Hospitalist Physician  [place] IF SYMPTOMS COME BACK, WORSEN OR NEW PROBLEM DEVELOPS   Please note: You were cared for by a hospitalist during your hospital stay. Every effort will be made to forward records to your primary care provider.  You can request that your primary care provider send for your hospital records if they have not received them.  Once you are discharged, your primary care physician will handle any further medical issues. Please note that NO REFILLS for any discharge medications will be authorized once you are discharged, as it is imperative that you return to your primary care physician (or establish a relationship with a primary care physician if you do not have one) for your post hospital discharge needs so that they can reassess your need for medications and monitor your lab values.  Please get a complete blood count and chemistry panel checked by your Primary MD at your next visit, and again as instructed by your Primary MD.  Get Medicines reviewed and adjusted: Please take all your medications with you for your next visit with your Primary MD  Laboratory/radiological data: Please request your Primary MD to go over all hospital tests and procedure/radiological results at the follow up, please ask your primary care provider to get all Hospital records sent to his/her office.  In some cases, they will be blood work, cultures and biopsy results pending at the time of your discharge. Please request that your primary care provider follow up on these results.  If you are diabetic, please bring your blood sugar readings with you to your follow up appointment with primary care.    Please call and make your follow up appointments as soon as possible.    Also  Note the following: If you experience worsening of your admission symptoms, develop shortness of breath, life threatening emergency, suicidal or homicidal thoughts you must seek medical attention immediately by calling 911 or calling your MD immediately  if symptoms less severe.  You must read complete instructions/literature along with all the possible adverse reactions/side effects for all the Medicines you take and that have been prescribed to you. Take any new Medicines after you have completely understood and accpet all the possible adverse reactions/side effects.   Do not drive when taking Pain medications or sleeping medications (Benzodiazepines)  Do not take more than prescribed Pain, Sleep and Anxiety Medications. It is not advisable to combine anxiety,sleep and pain medications without talking with your primary care practitioner  Special Instructions: If you have smoked or chewed Tobacco  in the last 2 yrs please stop smoking, stop any regular Alcohol  and or any Recreational drug use.  Wear Seat belts while driving.  Do not drive if taking any narcotic, mind altering or controlled substances or recreational drugs or alcohol.

## 2024-08-18 NOTE — Plan of Care (Signed)
   Problem: Health Behavior/Discharge Planning: Goal: Ability to manage health-related needs will improve Outcome: Progressing

## 2024-08-18 NOTE — Telephone Encounter (Unsigned)
 Copied from CRM #[id number]. Topic: General - Other >> Aug 18, 2024 11:17 AM Avram MATSU wrote: Reason for CRM: Reena is calling to follow up on patient form that was uploaded an faxed on 07/01/24. Attention to [employer].  Fax:[phone]

## 2024-08-18 NOTE — Progress Notes (Signed)
 Mobility Specialist Progress Note:    08/18/24 1000  Mobility  Activity Ambulated with assistance  Level of Assistance Independent  Assistive Device None  Distance Ambulated (ft) 120 ft  Range of Motion/Exercises Active;All extremities  Activity Response Tolerated well  Mobility Referral Yes  Mobility visit 1 Mobility  Mobility Specialist Start Time (ACUTE ONLY) 1000  Mobility Specialist Stop Time (ACUTE ONLY) 1018  Mobility Specialist Time Calculation (min) (ACUTE ONLY) 18 min   Pt received in bed, agreeable to mobility. Independently able to stand and ambulate with no AD. Tolerated well, SpO2 96% on RA at rest. SpO2 93% on RA during ambulation. Returned supine, all needs met.  Courtnei Ruddell Mobility Specialist Please contact via Special Educational Needs Teacher or  Rehab office at [phone]

## 2024-08-18 NOTE — Progress Notes (Signed)
 Patient's oxygen  not turned on at start of shift. Checked O2 and it was 98% on room air.

## 2024-08-18 NOTE — Discharge Summary (Signed)
 Physician Discharge Summary  [name] FMW:[id number] DOB: 1981-05-18 DOA: 08/13/2024  PCP: Antonetta Rollene BRAVO, MD  Admit date: 08/13/2024 Discharge date: 08/18/2024  Admitted From:  Home  Disposition:  Home   Recommendations for Outpatient Follow-up:  Follow up with PCP in 1-2 weeks  Discharge Condition: STABLE   CODE STATUS: FULL DIET: resume heart healthy    Brief Hospitalization Summary: Please see all hospital notes, images, labs for full details of the hospitalization. Admission provider HPI:  44 y.o. female with medical history significant of disorder migraines who comes to the ER with chief complaint of ongoing shortness of breath and cough, chills.  She was evaluated in the ER on 1/15 and diagnosed with influenza.  She took Tamiflu  but continued to have symptoms prompting her to come to the ER.  She states she might have had fever.  Reports chills.  Denies any chest pain.  Feels fatigued.   ED Course: Vital signs stable in the ER.  She was mildly tachypneic and borderline tachycardic.  She is requiring some supplemental oxygen .  Blood pressure stable.  Laboratory work notable for leukocytosis 12, lactic acid elevated 2.6, D-dimer 1.6, sodium 133, potassium 3.4, troponin negative.  CT angiogram PE protocol revealed diffuse opacities in both lung consistent with multifocal pneumonia, no evidence of PE.  Patient is admitted for hypoxia/sepsis secondary to pneumonia  Hospital Course by listed problems   44 year old female with sepsis/hypoxia secondary to diffuse multifocal pneumonia.     Multifocal pneumonia, acute hypoxic respiratory failure Severe sepsis Recent influenza A   - IMPROVING. She is on room air oxygen  and after ambulation, no requirement for supplemental oxygen . Coarse crackles resolved.  -proCalcitonin 0.20 Mucolytics, supportive care,sch duoneb Follow-up on blood culture, negative so far Incentive spirometry, encourage ambulation in the  hallways -Follow-up chest x-ray 1/26:  Improving infiltrates. Home nebulizer ordered with neb medications Pt stable to discharge home today.    Mild AGMA- improved   History of seizures, migraines: Resumed home medications-continue brivaracetam , buspirone , Trileptal , nortriptyline , pregabalin , zonisamide    Recent influenza: Completed Tamiflu   Discharge Diagnoses:  Principal Problem:   Sepsis due to pneumonia [facility]) Active Problems:   Cigarette smoker   Depression   Seizure (HCC)   GAD (generalized anxiety disorder)   Morbid obesity (HCC)   Acute hypoxemic respiratory failure (HCC)   Migraine headache   Obesity, class 2   High anion gap metabolic acidosis   Discharge Instructions:  Allergies as of 08/18/2024       Reactions   Morphine Other (See Comments)   Unknown Reaction         Medication List     STOP taking these medications    Aimovig  140 MG/ML Soaj Generic drug: Erenumab -aooe   MUCINEX  FAST-MAX KICKSTART PO   oseltamivir  75 MG capsule Commonly known as: TAMIFLU        TAKE these medications    acyclovir  400 MG tablet Commonly known as: ZOVIRAX  Take 1 tablet (400 mg total) by mouth 2 (two) times daily.   albuterol  108 (90 Base) MCG/ACT inhaler Commonly known as: VENTOLIN  HFA Inhale 1-2 puffs into the lungs every 6 (six) hours as needed for wheezing or shortness of breath.   aspirin  81 MG chewable tablet Chew 1 tablet (81 mg total) by mouth daily.   atorvastatin  40 MG tablet Commonly known as: LIPITOR TAKE 1 TABLET(40 MG) BY MOUTH DAILY   brivaracetam  100 MG Tabs tablet Commonly known as: Briviact  Take 1 tablet (100 mg total) by mouth  2 (two) times daily.   busPIRone  7.5 MG tablet Commonly known as: BUSPAR  Take 1 tablet (7.5 mg total) by mouth 3 (three) times daily.   doxycycline  100 MG tablet Commonly known as: VIBRA -TABS Take 1 tablet (100 mg total) by mouth every 12 (twelve) hours for 3 days.   guaiFENesin  600 MG 12 hr  tablet Commonly known as: MUCINEX  Take 2 tablets (1,200 mg total) by mouth 2 (two) times daily for 3 days.   ipratropium-albuterol  0.5-2.5 (3) MG/3ML Soln Commonly known as: DUONEB Take 3 mLs by nebulization 3 (three) times daily.   NEOMYCIN -POLYMYXIN-HYDROCORTISONE  1 % Soln OTIC solution Commonly known as: CORTISPORIN Place 3 drops into the right ear 3 (three) times daily.   nortriptyline  25 MG capsule Commonly known as: PAMELOR  TAKE 2 CAPSULES BY MOUTH EVERY NIGHT   oxcarbazepine  600 MG tablet Commonly known as: TRILEPTAL  Take 1 tablet two times daily .   pregabalin  100 MG capsule Commonly known as: Lyrica  Take 1 capsule every night What changed: Another medication with the same name was removed. Continue taking this medication, and follow the directions you see here.   promethazine -dextromethorphan  6.25-15 MG/5ML syrup Commonly known as: PROMETHAZINE -DM Take 5 mLs by mouth 4 (four) times daily as needed for cough. What changed: reasons to take this   semaglutide -weight management 0.25 MG/0.5ML Soaj SQ injection Commonly known as: WEGOVY  Inject 0.25 mg into the skin once a week.   UNABLE TO FIND Med Name: Chux Pads   Vitamin D  (Ergocalciferol ) 1.25 MG (50000 UNIT) Caps capsule Commonly known as: DRISDOL  Take 1 capsule (50,000 Units total) by mouth every 7 (seven) days.   zonisamide  100 MG capsule Commonly known as: ZONEGRAN  TAKE ONE (1) CAPSULE BY MOUTH ONCE DAILY IN THE MORNING AND TAKE FIVE (5) CAPSULES IN THE EVENING               Durable Medical Equipment  (From admission, onward)           Start     Ordered   08/18/24 1040  For home use only DME Nebulizer machine  Once       Question Answer Comment  Patient needs a nebulizer to treat with the following condition Chronic respiratory failure (HCC)   Length of Need Lifetime   Additional equipment included Administration kit      08/18/24 1040            Follow-up Information     Antonetta Rollene BRAVO, MD. Schedule an appointment as soon as possible for a visit in 1 week(s).   Specialty: Family Medicine Why: Hospital Follow Up Contact information: [address] [place] [address] [phone]                Allergies[1] Allergies as of 08/18/2024       Reactions   Morphine Other (See Comments)   Unknown Reaction         Medication List     STOP taking these medications    Aimovig  140 MG/ML Soaj Generic drug: Erenumab -aooe   MUCINEX  FAST-MAX KICKSTART PO   oseltamivir  75 MG capsule Commonly known as: TAMIFLU        TAKE these medications    acyclovir  400 MG tablet Commonly known as: ZOVIRAX  Take 1 tablet (400 mg total) by mouth 2 (two) times daily.   albuterol  108 (90 Base) MCG/ACT inhaler Commonly known as: VENTOLIN  HFA Inhale 1-2 puffs into the lungs every 6 (six) hours as needed for wheezing or  shortness of breath.   aspirin  81 MG chewable tablet Chew 1 tablet (81 mg total) by mouth daily.   atorvastatin  40 MG tablet Commonly known as: LIPITOR TAKE 1 TABLET(40 MG) BY MOUTH DAILY   brivaracetam  100 MG Tabs tablet Commonly known as: Briviact  Take 1 tablet (100 mg total) by mouth 2 (two) times daily.   busPIRone  7.5 MG tablet Commonly known as: BUSPAR  Take 1 tablet (7.5 mg total) by mouth 3 (three) times daily.   doxycycline  100 MG tablet Commonly known as: VIBRA -TABS Take 1 tablet (100 mg total) by mouth every 12 (twelve) hours for 3 days.   guaiFENesin  600 MG 12 hr tablet Commonly known as: MUCINEX  Take 2 tablets (1,200 mg total) by mouth 2 (two) times daily for 3 days.   ipratropium-albuterol  0.5-2.5 (3) MG/3ML Soln Commonly known as: DUONEB Take 3 mLs by nebulization 3 (three) times daily.   NEOMYCIN -POLYMYXIN-HYDROCORTISONE  1 % Soln OTIC solution Commonly known as: CORTISPORIN Place 3 drops into the right ear 3 (three) times daily.   nortriptyline  25 MG capsule Commonly known as: PAMELOR  TAKE 2  CAPSULES BY MOUTH EVERY NIGHT   oxcarbazepine  600 MG tablet Commonly known as: TRILEPTAL  Take 1 tablet two times daily .   pregabalin  100 MG capsule Commonly known as: Lyrica  Take 1 capsule every night What changed: Another medication with the same name was removed. Continue taking this medication, and follow the directions you see here.   promethazine -dextromethorphan  6.25-15 MG/5ML syrup Commonly known as: PROMETHAZINE -DM Take 5 mLs by mouth 4 (four) times daily as needed for cough. What changed: reasons to take this   semaglutide -weight management 0.25 MG/0.5ML Soaj SQ injection Commonly known as: WEGOVY  Inject 0.25 mg into the skin once a week.   UNABLE TO FIND Med Name: Chux Pads   Vitamin D  (Ergocalciferol ) 1.25 MG (50000 UNIT) Caps capsule Commonly known as: DRISDOL  Take 1 capsule (50,000 Units total) by mouth every 7 (seven) days.   zonisamide  100 MG capsule Commonly known as: ZONEGRAN  TAKE ONE (1) CAPSULE BY MOUTH ONCE DAILY IN THE MORNING AND TAKE FIVE (5) CAPSULES IN THE EVENING               Durable Medical Equipment  (From admission, onward)           Start     Ordered   08/18/24 1040  For home use only DME Nebulizer machine  Once       Question Answer Comment  Patient needs a nebulizer to treat with the following condition Chronic respiratory failure (HCC)   Length of Need Lifetime   Additional equipment included Administration kit      08/18/24 1040            Procedures/Studies: DG CHEST PORT 1 VIEW Result Date: 08/16/2024 CLINICAL DATA:  Pneumonia, cough, short of breath EXAM: PORTABLE CHEST 1 VIEW COMPARISON:  Chest x-ray and CT scan of the chest 08/13/2024 FINDINGS: Stable mild cardiomegaly. Decreased pulmonary vascular congestion. Persistent patchy foci of airspace opacity bilaterally consistent with the clinical history of multifocal pneumonia. No pneumothorax or effusion. No acute osseous abnormality. IMPRESSION: 1. Stable  cardiomegaly with decreased pulmonary vascular congestion. 2. Persistent but slightly less conspicuous multifocal patchy airspace opacities consistent with the clinical history of multifocal pneumonia. 3. No new process or complicating feature identified. Electronically Signed   By: Wilkie Lent M.D.   On: 08/16/2024 11:05   CT Angio Chest PE W/Cm &/Or Wo Cm Result Date: 08/13/2024 CLINICAL DATA:  Shortness  of breath, cough EXAM: CT ANGIOGRAPHY CHEST WITH CONTRAST TECHNIQUE: Multidetector CT imaging of the chest was performed using the standard protocol during bolus administration of intravenous contrast. Multiplanar CT image reconstructions and MIPs were obtained to evaluate the vascular anatomy. RADIATION DOSE REDUCTION: This exam was performed according to the departmental dose-optimization program which includes automated exposure control, adjustment of the mA and/or kV according to patient size and/or use of iterative reconstruction technique. CONTRAST:  75mL OMNIPAQUE  IOHEXOL  350 MG/ML SOLN COMPARISON:  August 29, 2023 FINDINGS: Cardiovascular: Satisfactory opacification of the pulmonary arteries to the segmental level. No evidence of pulmonary embolism. Normal heart size. No pericardial effusion. Mediastinum/Nodes: Stable diffuse thyroid  enlargement. Mildly enlarged mediastinal adenopathy is noted appears to be enlarged compared to prior exam, with largest lymph node measuring 19 mm in subcarinal region. This most likely is inflammatory or reactive in etiology. Esophagus is unremarkable. Lungs/Pleura: No pneumothorax or pleural effusion is noted. Patchy airspace opacities are noted throughout both lungs most consistent with multifocal pneumonia. Upper Abdomen: No acute abnormality. Musculoskeletal: No chest wall abnormality. No acute or significant osseous findings. Review of the MIP images confirms the above findings. IMPRESSION: 1. No definite evidence of pulmonary embolus. 2. Patchy airspace  opacities are noted throughout both lungs most consistent with multifocal pneumonia. 3. Mildly enlarged mediastinal adenopathy is noted which most likely is inflammatory or reactive in etiology. 4. Stable diffuse thyroid  enlargement. Electronically Signed   By: Lynwood Landy Raddle M.D.   On: 08/13/2024 13:30   DG Chest 2 View Result Date: 08/13/2024 CLINICAL DATA:  Shortness of breath and cough. Recent history of flu. EXAM: CHEST - 2 VIEW COMPARISON:  08/05/2024 FINDINGS: The heart size and mediastinal contours are within normal limits. New bilateral patchy multifocal airspace opacities, concerning for multifocal pneumonia. Pulmonary edema could have a similar appearance. No pleural effusion. No pneumothorax. No acute osseous abnormality. IMPRESSION: New bilateral patchy multifocal airspace opacities, concerning for multifocal pneumonia. Pulmonary edema could have a similar appearance. Electronically Signed   By: Harrietta Sherry M.D.   On: 08/13/2024 12:53   DG Chest Port 1 View Result Date: 08/05/2024 CLINICAL DATA:  Cough, fever EXAM: PORTABLE CHEST 1 VIEW COMPARISON:  09/02/2023 FINDINGS: Single frontal view of the chest demonstrates a stable cardiac silhouette. No acute airspace disease, effusion, or pneumothorax. No acute bony abnormalities. IMPRESSION: 1. No acute intrathoracic process. Electronically Signed   By: Ozell Daring M.D.   On: 08/05/2024 15:16     Subjective: Pt reports she is breathing a lot better today.  Oxygen  removed and maintaining on room air O2.   Discharge Exam: Vitals:   08/18/24 0450 08/18/24 0821  BP: 118/79   Pulse: 79   Resp: 18   Temp: 98.1 F (36.7 C)   SpO2: 100% 99%   Vitals:   08/17/24 1815 08/17/24 1932 08/18/24 0450 08/18/24 0821  BP: 120/88 112/80 118/79   Pulse: 89 88 79   Resp: 16 18 18    Temp: 98.1 F (36.7 C) 97.6 F (36.4 C) 98.1 F (36.7 C)   TempSrc: Oral Oral Oral   SpO2: 97% 95% 100% 99%  Weight:      Height:        General: Pt is  alert, awake, not in acute distress Cardiovascular: RRR, S1/S2 +, no rubs, no gallops Respiratory: no increased work of breathing.  Abdominal: Soft, NT, ND, bowel sounds + Extremities: no edema, no cyanosis   The results of significant diagnostics from this hospitalization (including imaging,  microbiology, ancillary and laboratory) are listed below for reference.     Microbiology: Recent Results (from the past 240 hours)  Culture, blood (routine x 2)     Status: None   Collection Time: 08/13/24  1:08 PM   Specimen: BLOOD  Result Value Ref Range Status   Specimen Description BLOOD RIGHT ASSIST CONTROL  Final   Special Requests   Final    BOTTLES DRAWN AEROBIC AND ANAEROBIC Blood Culture adequate volume   Culture   Final    NO GROWTH 5 DAYS Performed at [facility], [address], [place], [place] [address]    Report Status 08/18/2024 FINAL  Final  Culture, blood (routine x 2)     Status: None   Collection Time: 08/13/24  1:51 PM   Specimen: BLOOD LEFT ARM  Result Value Ref Range Status   Specimen Description BLOOD LEFT ARM  Final   Special Requests   Final    BOTTLES DRAWN AEROBIC AND ANAEROBIC Blood Culture adequate volume   Culture   Final    NO GROWTH 5 DAYS Performed at [facility], [address], [place], [place] [address]    Report Status 08/18/2024 FINAL  Final  MRSA Next Gen by PCR, Nasal     Status: None   Collection Time: 08/14/24  9:55 PM   Specimen: Nasal Mucosa; Nasal Swab  Result Value Ref Range Status   MRSA by PCR Next Gen NOT DETECTED NOT DETECTED Final    Comment: (NOTE) The GeneXpert MRSA Assay (FDA approved for NASAL specimens only), is one component of a comprehensive MRSA colonization surveillance program. It is not intended to diagnose MRSA infection nor to guide or monitor treatment for MRSA infections. Test performance is not FDA approved in patients less than 61 years old. Performed at [facility], [address], [place], [place] [address]       Labs: BNP (last 3 results) Recent Labs    09/02/23 0349 09/03/23 0608 09/04/23 0544  BNP 59.3 23.2 25.5   Basic Metabolic Panel: Recent Labs  Lab 08/13/24 1130 08/14/24 0402 08/18/24 0512  NA 133* 134* 133*  K 3.4* 4.3 3.2*  CL 98 109 99  CO2 19* 18* 21*  GLUCOSE 93 92 81  BUN <5* 8 <5*  CREATININE 0.88 0.76 0.68  CALCIUM  8.8* 8.9 8.8*  MG  --   --  1.7   Liver Function Tests: Recent Labs  Lab 08/13/24 1130  AST 17  ALT 12  ALKPHOS 158*  BILITOT 0.5  PROT 8.1  ALBUMIN 3.9   No results for input(s): LIPASE, AMYLASE in the last 168 hours. No results for input(s): AMMONIA  in the last 168 hours. CBC: Recent Labs  Lab 08/13/24 1130 08/14/24 0402 08/18/24 0512  WBC 12.7* 11.8* 7.0  NEUTROABS 10.8*  --  4.2  HGB 11.3* 10.2* 10.0*  HCT 35.7* 31.7* 30.6*  MCV 88.1 87.6 86.9  PLT 470* 447* 484*   Cardiac Enzymes: No results for input(s): CKTOTAL, CKMB, CKMBINDEX, TROPONINI in the last 168 hours. BNP: Invalid input(s): POCBNP CBG: No results for input(s): GLUCAP in the last 168 hours. D-Dimer No results for input(s): DDIMER in the last 72 hours. Hgb A1c No results for input(s): HGBA1C in the last 72 hours. Lipid Profile No results for input(s): CHOL, HDL, LDLCALC, TRIG, CHOLHDL, LDLDIRECT in the last 72 hours. Thyroid  function studies No results for input(s): TSH, T4TOTAL, T3FREE, THYROIDAB in the last 72 hours.  Invalid input(s): FREET3 Anemia work up No  results for input(s): VITAMINB12, FOLATE, FERRITIN, TIBC, IRON, RETICCTPCT in the last 72 hours. Urinalysis    Component Value Date/Time   COLORURINE YELLOW 10/27/2023 1721   APPEARANCEUR CLEAR 10/27/2023 1721   LABSPEC 1.006 10/27/2023 1721   PHURINE 7.0 10/27/2023 1721   GLUCOSEU NEGATIVE 10/27/2023 1721   HGBUR NEGATIVE 10/27/2023 1721   BILIRUBINUR NEGATIVE 10/27/2023 1721   KETONESUR NEGATIVE 10/27/2023 1721   PROTEINUR NEGATIVE  10/27/2023 1721   UROBILINOGEN 0.2 06/09/2013 1825   NITRITE NEGATIVE 10/27/2023 1721   LEUKOCYTESUR NEGATIVE 10/27/2023 1721   Sepsis Labs Recent Labs  Lab 08/13/24 1130 08/14/24 0402 08/18/24 0512  WBC 12.7* 11.8* 7.0   Microbiology Recent Results (from the past 240 hours)  Culture, blood (routine x 2)     Status: None   Collection Time: 08/13/24  1:08 PM   Specimen: BLOOD  Result Value Ref Range Status   Specimen Description BLOOD RIGHT ASSIST CONTROL  Final   Special Requests   Final    BOTTLES DRAWN AEROBIC AND ANAEROBIC Blood Culture adequate volume   Culture   Final    NO GROWTH 5 DAYS Performed at [facility] [address] [place], [place] [address]    Report Status 08/18/2024 FINAL  Final  Culture, blood (routine x 2)     Status: None   Collection Time: 08/13/24  1:51 PM   Specimen: BLOOD LEFT ARM  Result Value Ref Range Status   Specimen Description BLOOD LEFT ARM  Final   Special Requests   Final    BOTTLES DRAWN AEROBIC AND ANAEROBIC Blood Culture adequate volume   Culture   Final    NO GROWTH 5 DAYS Performed at [facility], [address] [place], [place] [address]    Report Status 08/18/2024 FINAL  Final  MRSA Next Gen by PCR, Nasal     Status: None   Collection Time: 08/14/24  9:55 PM   Specimen: Nasal Mucosa; Nasal Swab  Result Value Ref Range Status   MRSA by PCR Next Gen NOT DETECTED NOT DETECTED Final    Comment: (NOTE) The GeneXpert MRSA Assay (FDA approved for NASAL specimens only), is one component of a comprehensive MRSA colonization surveillance program. It is not intended to diagnose MRSA infection nor to guide or monitor treatment for MRSA infections. Test performance is not FDA approved in patients less than 62 years old. Performed at [facility], [address], [place], [place] [address]    Time coordinating discharge:  37 mins  SIGNED:  Afton Louder, MD  [facility] 08/18/2024, 11:32 AM How to contact the [facility]  Attending or Consulting provider 7A - 7P or covering provider during after hours 7P -7A, for this patient?  Check the care team in [facility] and look for a) attending/consulting [facility] provider listed and b) the TR[facility] team listed Log into [email or web address] and use [place]'s universal password to access. If you do not have the password, please contact the hospital operator. Locate the [facility] provider you are looking for under [facility] and page to a number that you can be directly reached. If you still have difficulty reaching the provider, please page the [facility] (Director on Call) for the Hospitalists listed on amion for assistance.     [1]  Allergies Allergen Reactions   Morphine Other (See Comments)    Unknown Reaction

## 2024-08-18 NOTE — TOC Transition Note (Signed)
 Transition of Care [facility]) - Discharge Note   Patient Details  Name: [name] MRN: [id number] Date of Birth: 09/04/1980  [facility]) CM/SW Contact:  Lucie Lunger, LCSWA Phone Number: 08/18/2024, 11:07 AM  Clinical Narrative:    CSW updated that pt will need a neb machine at D/C. CSW sent referral to Zack with [employer], he will get machine delivered to room. TOC signing off.   Final next level of care: Home/Self Care Barriers to Discharge: Barriers Resolved   Patient Goals and CMS Choice Patient states their goals for this hospitalization and ongoing recovery are:: return home CMS Medicare.gov Compare Post Acute Care list provided to:: Patient Choice offered to / list presented to : Patient      Discharge Placement                       Discharge Plan and Services Additional resources added to the After Visit Summary for                  DME Arranged: Nebulizer machine DME Agency: [employer] Date DME Agency Contacted: 08/18/24   Representative spoke with at DME Agency: Zack            Social Drivers of Health (SDOH) Interventions SDOH Screenings   Food Insecurity: No Food Insecurity (08/13/2024)  Housing: Low Risk (08/13/2024)  Transportation Needs: No Transportation Needs (08/13/2024)  Utilities: Not At Risk (08/13/2024)  Alcohol Screen: Low Risk (07/30/2023)  Depression (PHQ2-9): High Risk (08/03/2024)  Financial Resource Strain: Low Risk (07/30/2023)  Physical Activity: Inactive (07/30/2023)  Social Connections: Socially Isolated (09/04/2023)  Stress: No Stress Concern Present (07/30/2023)  Tobacco Use: High Risk (08/15/2024)  Health Literacy: Adequate Health Literacy (07/30/2023)     Readmission Risk Interventions     No data to display

## 2024-08-18 NOTE — Telephone Encounter (Signed)
 Printed and refaxed

## 2024-08-24 ENCOUNTER — Ambulatory Visit: Payer: Self-pay | Admitting: Family Medicine

## 2024-08-24 ENCOUNTER — Encounter: Payer: Self-pay | Admitting: Family Medicine

## 2024-08-24 VITALS — BP 126/86 | HR 82 | Resp 16 | Ht 62.0 in | Wt 194.1 lb

## 2024-08-24 DIAGNOSIS — E559 Vitamin D deficiency, unspecified: Secondary | ICD-10-CM

## 2024-08-24 DIAGNOSIS — N898 Other specified noninflammatory disorders of vagina: Secondary | ICD-10-CM

## 2024-08-24 DIAGNOSIS — F411 Generalized anxiety disorder: Secondary | ICD-10-CM | POA: Diagnosis not present

## 2024-08-24 DIAGNOSIS — J188 Other pneumonia, unspecified organism: Secondary | ICD-10-CM | POA: Diagnosis not present

## 2024-08-24 DIAGNOSIS — R748 Abnormal levels of other serum enzymes: Secondary | ICD-10-CM | POA: Diagnosis not present

## 2024-08-24 DIAGNOSIS — F322 Major depressive disorder, single episode, severe without psychotic features: Secondary | ICD-10-CM

## 2024-08-24 DIAGNOSIS — Z09 Encounter for follow-up examination after completed treatment for conditions other than malignant neoplasm: Secondary | ICD-10-CM

## 2024-08-24 DIAGNOSIS — Z13220 Encounter for screening for lipoid disorders: Secondary | ICD-10-CM

## 2024-08-24 MED ORDER — VITAMIN D (ERGOCALCIFEROL) 1.25 MG (50000 UNIT) PO CAPS: 50000.0000 [IU] | ORAL_CAPSULE | ORAL | 0 refills | Status: AC

## 2024-08-24 MED ORDER — FLUCONAZOLE 100 MG PO TABS: 100.0000 mg | ORAL_TABLET | Freq: Every day | ORAL | 0 refills | Status: AC

## 2024-08-24 MED ORDER — BUSPIRONE HCL 7.5 MG PO TABS: 7.5000 mg | ORAL_TABLET | Freq: Three times a day (TID) | ORAL | 3 refills | Status: AC

## 2024-08-25 ENCOUNTER — Ambulatory Visit: Payer: Self-pay | Admitting: Family Medicine

## 2024-08-25 ENCOUNTER — Encounter: Payer: Self-pay | Admitting: Family Medicine

## 2024-08-25 DIAGNOSIS — N898 Other specified noninflammatory disorders of vagina: Secondary | ICD-10-CM | POA: Insufficient documentation

## 2024-08-25 DIAGNOSIS — R748 Abnormal levels of other serum enzymes: Secondary | ICD-10-CM

## 2024-08-25 DIAGNOSIS — R7989 Other specified abnormal findings of blood chemistry: Secondary | ICD-10-CM

## 2024-08-25 DIAGNOSIS — Z13220 Encounter for screening for lipoid disorders: Secondary | ICD-10-CM | POA: Insufficient documentation

## 2024-08-25 DIAGNOSIS — J188 Other pneumonia, unspecified organism: Secondary | ICD-10-CM | POA: Insufficient documentation

## 2024-08-25 LAB — CMP14+EGFR
ALT: 19 [IU]/L (ref 0–32)
AST: 17 [IU]/L (ref 0–40)
Albumin: 3.4 g/dL — ABNORMAL LOW (ref 3.9–4.9)
Alkaline Phosphatase: 183 [IU]/L — ABNORMAL HIGH (ref 41–116)
BUN/Creatinine Ratio: 4 — ABNORMAL LOW (ref 9–23)
BUN: 3 mg/dL — ABNORMAL LOW (ref 6–24)
Bilirubin Total: <0.2 mg/dL (ref 0.0–1.2)
CO2: 21 mmol/L (ref 20–29)
Calcium: 9 mg/dL (ref 8.7–10.2)
Chloride: 102 mmol/L (ref 96–106)
Creatinine, Ser: 0.69 mg/dL (ref 0.57–1.00)
Globulin, Total: 3.7 g/dL (ref 1.5–4.5)
Glucose: 82 mg/dL (ref 70–99)
Potassium: 4.4 mmol/L (ref 3.5–5.2)
Sodium: 138 mmol/L (ref 134–144)
Total Protein: 7.1 g/dL (ref 6.0–8.5)
eGFR: 109 mL/min/{1.73_m2}

## 2024-08-25 LAB — CBC WITH DIFFERENTIAL/PLATELET
Basophils Absolute: 0 10*3/uL (ref 0.0–0.2)
Basos: 0 %
EOS (ABSOLUTE): 0 10*3/uL (ref 0.0–0.4)
Eos: 1 %
Hematocrit: 36.1 % (ref 34.0–46.6)
Hemoglobin: 11.2 g/dL (ref 11.1–15.9)
Immature Grans (Abs): 0 10*3/uL (ref 0.0–0.1)
Immature Granulocytes: 0 %
Lymphocytes Absolute: 1.8 10*3/uL (ref 0.7–3.1)
Lymphs: 31 %
MCH: 28.1 pg (ref 26.6–33.0)
MCHC: 31 g/dL — ABNORMAL LOW (ref 31.5–35.7)
MCV: 91 fL (ref 79–97)
Monocytes Absolute: 0.6 10*3/uL (ref 0.1–0.9)
Monocytes: 10 %
Neutrophils Absolute: 3.2 10*3/uL (ref 1.4–7.0)
Neutrophils: 58 %
Platelets: 636 10*3/uL — ABNORMAL HIGH (ref 150–450)
RBC: 3.98 x10E6/uL (ref 3.77–5.28)
RDW: 16.1 % — ABNORMAL HIGH (ref 11.7–15.4)
WBC: 5.6 10*3/uL (ref 3.4–10.8)

## 2024-08-25 LAB — LIPID PANEL
Chol/HDL Ratio: 2.3 ratio (ref 0.0–4.4)
Cholesterol, Total: 134 mg/dL (ref 100–199)
HDL: 58 mg/dL
LDL Chol Calc (NIH): 60 mg/dL (ref 0–99)
Triglycerides: 85 mg/dL (ref 0–149)
VLDL Cholesterol Cal: 16 mg/dL (ref 5–40)

## 2024-08-25 LAB — VITAMIN D 25 HYDROXY (VIT D DEFICIENCY, FRACTURES): Vit D, 25-Hydroxy: 37.9 ng/mL (ref 30.0–100.0)

## 2024-08-25 NOTE — Assessment & Plan Note (Signed)
 Nu swab sent and treat presumptively for candidiasis

## 2024-08-25 NOTE — Progress Notes (Signed)
" ° °  [name]     MRN: [id number]      DOB: Oct 27, 1980  Chief Complaint  Patient presents with   Hospitalization Follow-up    Admitted 01/23-01/28 for Pneumonia and sepsis    Vaginal Itching    Pt complains of vaginal itching since leaving hospital     HPI [name] is here for follow up of hospitalization from 1/23 to 1/28 with dx of multifocal pneumonia and acute hypoxic respiratory failure, sepsis and influenza A States her cough and breathing are much better, no fever chills  or shortness of breath and has completed her antibiotics C/OBJECTIVE: excess vaginal itching C/o severe depression and anxietywants psych help ROS See HPI  Denies chest pains, palpitations and leg swelling Denies abdominal pain, nausea, vomiting,diarrhea or constipation.   Denies dysuria, frequency, hesitancy or incontinence. Denies joint pain, swelling and limitation in mobility. . Denies skin break down or rash.   PE  BP 126/86   Pulse 82   Resp 16   Ht 5' 2 (1.575 m)   Wt 194 lb 1.9 oz (88.1 kg)   SpO2 97%   BMI 35.51 kg/m   Patient alert and oriented and in no cardiopulmonary distress.  HEENT: No facial asymmetry, EOMI,     Neck supple .  Chest: Clear to auscultation bilaterally.  CVS: S1, S2 no murmurs, no S3.Regular rate.  ABD: Soft non tender.   Ext: No edema  MS: Adequate ROM spine, shoulders, hips and knees.  Skin: Intact, no ulcerations or rash noted.  Psych: Good eye contact, t. Memory intact  anxious and  depressed appearing.  CNS: CN 2-12 intact, power,  normal throughout.no focal deficits noted.   Assessment & Plan  Depression, major, single episode, severe (HCC) Refer to psych for management  GAD (generalized anxiety disorder) Severe anxiety refer to Psych for treatment and to therapist also andbuspar inc to three times daily  Elevated alkaline phosphatase level Refer GI for eval and management  Multifocal pneumonia Asymptomatic  with normal chest  exam at visit, rept CXR in 8 weeks   Hospital discharge follow-up Patient in for follow up of recent hospitalization. Discharge summary, and laboratory and radiology data are reviewed, and any questions or concerns  are discussed. Specific issues requiring follow up are specifically addressed.   Vaginal itching Nu swab sent and treat presumptively for candidiasis  Vitamin D  deficiency Updated lab needed   "

## 2024-08-25 NOTE — Assessment & Plan Note (Signed)
 Refer to psych for management

## 2024-08-25 NOTE — Assessment & Plan Note (Signed)
Patient in for follow up of recent hospitalization. Discharge summary, and laboratory and radiology data are reviewed, and any questions or concerns  are discussed. Specific issues requiring follow up are specifically addressed.  

## 2024-08-25 NOTE — Assessment & Plan Note (Addendum)
 Severe anxiety refer to Psych for treatment and to therapist also andbuspar inc to three times daily

## 2024-08-25 NOTE — Assessment & Plan Note (Signed)
 Updated lab needed.

## 2024-08-25 NOTE — Assessment & Plan Note (Signed)
 Asymptomatic  with normal chest exam at visit, rept CXR in 8 weeks

## 2024-08-25 NOTE — Assessment & Plan Note (Signed)
 Refer GI for eval and management

## 2024-08-26 ENCOUNTER — Telehealth: Payer: Self-pay

## 2024-08-26 ENCOUNTER — Ambulatory Visit: Payer: Self-pay

## 2024-08-26 ENCOUNTER — Telehealth: Payer: Self-pay | Admitting: Family Medicine

## 2024-08-26 MED ORDER — METRONIDAZOLE 500 MG PO TABS: 500.0000 mg | ORAL_TABLET | Freq: Two times a day (BID) | ORAL | 0 refills | Status: AC

## 2024-08-26 NOTE — Addendum Note (Signed)
 Addended by: ANTONETTA ROLLENE BRAVO on: 08/26/2024 07:15 AM   Modules accepted: Orders

## 2024-08-26 NOTE — Telephone Encounter (Signed)
 Copied from CRM #[id number]. Topic: Clinical - Lab/Test Results >> Aug 25, 2024  4:54 PM Victoria B wrote: Reason for CRM: gave patient lab results, and told her about the referrals that have been requested. patient understood

## 2024-08-26 NOTE — Telephone Encounter (Unsigned)
 Copied from CRM #[id number]. Topic: General - Other >> Aug 26, 2024 11:37 AM Avram MATSU wrote: Reason for CRM: patient was not able to hold any longer, please advise [phone]

## 2024-08-26 NOTE — Telephone Encounter (Signed)
 FYI Only or Action Required?: Action required by provider: clinical question for provider.  Patient was last seen in primary care on 08/24/2024 by Antonetta Rollene BRAVO, MD.  Called Nurse Triage reporting Vaginal Itching.  Symptoms began several weeks ago.  Interventions attempted: Prescription medications: Fluconazole .  Symptoms are: unchanged.  Triage Disposition: Call PCP Now  Patient/caregiver understands and will follow disposition?: Yes  Reason for Disposition  [1] SEVERE pain (e.g., excruciating, pain scale 8-10) AND [2] not improved after pain medications  Answer Assessment - Initial Assessment Questions Patient states that she was seen in office recently and was prescribed fluconazole  for vaginal itching. She has taken 2 doses of the medication and states that she is still experiencing the same symptoms. She states that she was supposed to get a prescription for a vaginal cream to help with itching, but there was not any cream at the pharmacy to pick up. Patient is requesting this prescription to assist with itching. Advised to call back if symptoms worsen. Advised to try OTC vaginal itch relief cream such as Monistat or Vagisil.   1. MAIN CONCERN OR SYMPTOM:  What is your main concern right now? What question do you have? What's the main symptom you're worried about? (e.g., breathing difficulty, cough, fever, pain)     Vaginal itching  2. ONSET: When did the  symptoms  start?     A couple weeks ago  3. BETTER-SAME-WORSE: Are you getting better, staying the same, or getting worse compared to how you felt at your last visit to the doctor (most recent medical visit)?     Same  4. VISIT DATE: When were you seen? (e.g., date)     08/24/24  5. VISIT DOCTOR: What is the name of the doctor taking care of you now?     Rollene Imus  6. VISIT DIAGNOSIS:  What was the main symptom or problem that you were seen for? Were you given a diagnosis?      Vaginal  itching  7. VISIT MEDICINES: Did the doctor order any new medicines for you to use? If Yes, ask: Have you filled the prescription and started taking the medicine?      Fluconazole   8. NEXT APPOINTMENT: Have you scheduled a follow-up appointment with your doctor?     April 2026  9. PAIN: Is there any pain? If Yes, ask: How bad is it?  (Scale 0-10; or none, mild, moderate, severe)     Yes, 10/10  10. FEVER: Do you have a fever? If Yes, ask: What is it, how was it measured  and when did it start?       No  11. OTHER SYMPTOMS: Do you have any other symptoms?       Swelling  12. PREGNANCY: Is there any chance you are pregnant? When was your last menstrual period?       Unknown  Protocols used: Recent Medical Visit for Illness Follow-up Call-[facility]  Reason for Triage: Patient is having severe vaginal itching along with pain and swelling.

## 2024-08-27 LAB — NUSWAB VAGINITIS PLUS (VG+)
Atopobium vaginae: HIGH {score} — AB
Candida albicans, NAA: POSITIVE — AB
Candida glabrata, NAA: POSITIVE — AB
Chlamydia trachomatis, NAA: NEGATIVE
Neisseria gonorrhoeae, NAA: NEGATIVE
Trich vag by NAA: NEGATIVE

## 2024-08-27 NOTE — Telephone Encounter (Signed)
 Lvm to cb. Dr. Antonetta sent in 7 day supply of metronidazole  tablets yesterday for her BV and itching

## 2024-09-10 ENCOUNTER — Ambulatory Visit

## 2024-09-13 ENCOUNTER — Ambulatory Visit (HOSPITAL_COMMUNITY)

## 2024-10-12 ENCOUNTER — Ambulatory Visit: Payer: Self-pay | Admitting: Neurology

## 2024-10-26 ENCOUNTER — Ambulatory Visit: Payer: Self-pay | Admitting: Family Medicine

## 2024-12-01 ENCOUNTER — Ambulatory Visit: Payer: Self-pay | Admitting: Family Medicine
